# Patient Record
Sex: Male | Born: 1960
Health system: Southern US, Community
[De-identification: ages and names within clinical notes are randomized; demographics above are authoritative.]

## PROBLEM LIST (undated history)

## (undated) DIAGNOSIS — D649 Anemia, unspecified: Secondary | ICD-10-CM

## (undated) DIAGNOSIS — K5792 Diverticulitis of intestine, part unspecified, without perforation or abscess without bleeding: Secondary | ICD-10-CM

## (undated) DIAGNOSIS — E7800 Pure hypercholesterolemia, unspecified: Secondary | ICD-10-CM

## (undated) DIAGNOSIS — E119 Type 2 diabetes mellitus without complications: Secondary | ICD-10-CM

## (undated) DIAGNOSIS — F1020 Alcohol dependence, uncomplicated: Secondary | ICD-10-CM

## (undated) DIAGNOSIS — I10 Essential (primary) hypertension: Secondary | ICD-10-CM

## (undated) HISTORY — PX: OTHER SURGICAL HISTORY: SHX169

---

## 2004-09-06 ENCOUNTER — Other Ambulatory Visit: Payer: Self-pay

## 2004-09-06 ENCOUNTER — Emergency Department: Payer: Self-pay | Admitting: Emergency Medicine

## 2004-09-13 ENCOUNTER — Emergency Department: Payer: Self-pay | Admitting: General Practice

## 2007-05-17 ENCOUNTER — Emergency Department: Payer: Self-pay | Admitting: Emergency Medicine

## 2009-06-30 ENCOUNTER — Emergency Department: Payer: Self-pay | Admitting: Emergency Medicine

## 2009-09-15 ENCOUNTER — Emergency Department: Payer: Self-pay | Admitting: Internal Medicine

## 2012-03-30 ENCOUNTER — Emergency Department: Payer: Self-pay | Admitting: Emergency Medicine

## 2013-07-31 LAB — COMPREHENSIVE METABOLIC PANEL
ALBUMIN: 4.3 g/dL (ref 3.4–5.0)
ANION GAP: 15 (ref 7–16)
AST: 73 U/L — AB (ref 15–37)
Alkaline Phosphatase: 102 U/L
BILIRUBIN TOTAL: 0.8 mg/dL (ref 0.2–1.0)
BUN: 13 mg/dL (ref 7–18)
Calcium, Total: 9.2 mg/dL (ref 8.5–10.1)
Chloride: 102 mmol/L (ref 98–107)
Co2: 21 mmol/L (ref 21–32)
Creatinine: 1.05 mg/dL (ref 0.60–1.30)
EGFR (African American): >60
EGFR (Non-African Amer.): >60
GLUCOSE: 101 mg/dL — AB (ref 65–99)
OSMOLALITY: 276 (ref 275–301)
Potassium: 4 mmol/L (ref 3.5–5.1)
SGPT (ALT): 58 U/L (ref 12–78)
Sodium: 138 mmol/L (ref 136–145)
Total Protein: 8.5 g/dL — ABNORMAL HIGH (ref 6.4–8.2)

## 2013-07-31 LAB — CBC
HCT: 36.7 % — ABNORMAL LOW (ref 40.0–52.0)
HGB: 11.5 g/dL — AB (ref 13.0–18.0)
MCH: 29.2 pg (ref 26.0–34.0)
MCHC: 31.5 g/dL — ABNORMAL LOW (ref 32.0–36.0)
MCV: 93 fL (ref 80–100)
Platelet: 130 10*3/uL — ABNORMAL LOW (ref 150–440)
RBC: 3.95 10*6/uL — ABNORMAL LOW (ref 4.40–5.90)
RDW: 16.9 % — AB (ref 11.5–14.5)
WBC: 4.5 10*3/uL (ref 3.8–10.6)

## 2013-07-31 LAB — ETHANOL

## 2013-07-31 LAB — SALICYLATE LEVEL

## 2013-07-31 LAB — TSH: THYROID STIMULATING HORM: 3.08 u[IU]/mL

## 2013-07-31 LAB — ACETAMINOPHEN LEVEL: Acetaminophen: <2 ug/mL

## 2013-08-01 ENCOUNTER — Inpatient Hospital Stay: Payer: Self-pay | Admitting: Internal Medicine

## 2013-08-01 LAB — URINALYSIS, COMPLETE
BILIRUBIN, UR: NEGATIVE
Bacteria: NEGATIVE
GLUCOSE, UR: NEGATIVE mg/dL (ref 0–75)
Leukocyte Esterase: NEGATIVE
Nitrite: NEGATIVE
PH: 6 (ref 4.5–8.0)
Protein: 30
Specific Gravity: 1.017 (ref 1.003–1.030)

## 2013-08-01 LAB — DRUG SCREEN, URINE

## 2013-08-01 LAB — LIPASE, BLOOD: Lipase: 186 U/L (ref 73–393)

## 2013-08-02 LAB — COMPREHENSIVE METABOLIC PANEL
ALBUMIN: 3.3 g/dL — AB (ref 3.4–5.0)
AST: 56 U/L — AB (ref 15–37)
Alkaline Phosphatase: 91 U/L
Anion Gap: 9 (ref 7–16)
BUN: 8 mg/dL (ref 7–18)
Bilirubin,Total: 1.3 mg/dL — ABNORMAL HIGH (ref 0.2–1.0)
CALCIUM: 7.7 mg/dL — AB (ref 8.5–10.1)
CHLORIDE: 103 mmol/L (ref 98–107)
CO2: 23 mmol/L (ref 21–32)
CREATININE: 1 mg/dL (ref 0.60–1.30)
EGFR (African American): >60
EGFR (Non-African Amer.): >60
Glucose: 104 mg/dL — ABNORMAL HIGH (ref 65–99)
Osmolality: 269 (ref 275–301)
Potassium: 3.5 mmol/L (ref 3.5–5.1)
SGPT (ALT): 41 U/L (ref 12–78)
Sodium: 135 mmol/L — ABNORMAL LOW (ref 136–145)
Total Protein: 7.4 g/dL (ref 6.4–8.2)

## 2013-08-02 LAB — CBC WITH DIFFERENTIAL/PLATELET
BASOS ABS: 0 10*3/uL (ref 0.0–0.1)
Basophil %: 1 %
EOS ABS: 0.1 10*3/uL (ref 0.0–0.7)
EOS PCT: 2.3 %
HCT: 34.5 % — AB (ref 40.0–52.0)
HGB: 11.3 g/dL — ABNORMAL LOW (ref 13.0–18.0)
LYMPHS ABS: 0.8 10*3/uL — AB (ref 1.0–3.6)
Lymphocyte %: 21.8 %
MCH: 30.3 pg (ref 26.0–34.0)
MCHC: 32.8 g/dL (ref 32.0–36.0)
MCV: 92 fL (ref 80–100)
Monocyte #: 0.4 x10 3/mm (ref 0.2–1.0)
Monocyte %: 11.6 %
Neutrophil #: 2.2 10*3/uL (ref 1.4–6.5)
Neutrophil %: 63.3 %
Platelet: 111 10*3/uL — ABNORMAL LOW (ref 150–440)
RBC: 3.73 10*6/uL — ABNORMAL LOW (ref 4.40–5.90)
RDW: 16.6 % — ABNORMAL HIGH (ref 11.5–14.5)
WBC: 3.5 10*3/uL — ABNORMAL LOW (ref 3.8–10.6)

## 2013-08-02 LAB — TROPONIN I
Troponin-I: <0.02 ng/mL
Troponin-I: <0.02 ng/mL

## 2013-09-19 ENCOUNTER — Emergency Department: Payer: Self-pay | Admitting: Student

## 2013-09-19 LAB — URINALYSIS, COMPLETE
BILIRUBIN, UR: NEGATIVE
Bacteria: NONE SEEN
Blood: NEGATIVE
Glucose,UR: NEGATIVE mg/dL (ref 0–75)
Hyaline Cast: 2
KETONE: NEGATIVE
Leukocyte Esterase: NEGATIVE
NITRITE: NEGATIVE
PH: 5 (ref 4.5–8.0)
RBC, UR: NONE SEEN /HPF (ref 0–5)
SPECIFIC GRAVITY: 1.009 (ref 1.003–1.030)
SQUAMOUS EPITHELIAL: NONE SEEN
WBC UR: NONE SEEN /HPF (ref 0–5)

## 2013-09-19 LAB — COMPREHENSIVE METABOLIC PANEL
ANION GAP: 12 (ref 7–16)
Albumin: 4.1 g/dL (ref 3.4–5.0)
Alkaline Phosphatase: 85 U/L
BUN: 18 mg/dL (ref 7–18)
Bilirubin,Total: 0.6 mg/dL (ref 0.2–1.0)
CALCIUM: 8.1 mg/dL — AB (ref 8.5–10.1)
CO2: 22 mmol/L (ref 21–32)
Chloride: 107 mmol/L (ref 98–107)
Creatinine: 1.25 mg/dL (ref 0.60–1.30)
EGFR (African American): >60
Glucose: 121 mg/dL — ABNORMAL HIGH (ref 65–99)
Osmolality: 284 (ref 275–301)
Potassium: 3.4 mmol/L — ABNORMAL LOW (ref 3.5–5.1)
SGOT(AST): 50 U/L — ABNORMAL HIGH (ref 15–37)
SGPT (ALT): 32 U/L
SODIUM: 141 mmol/L (ref 136–145)
Total Protein: 8.1 g/dL (ref 6.4–8.2)

## 2013-09-19 LAB — CBC
HCT: 38.3 % — ABNORMAL LOW (ref 40.0–52.0)
HGB: 12.5 g/dL — AB (ref 13.0–18.0)
MCH: 29.7 pg (ref 26.0–34.0)
MCHC: 32.7 g/dL (ref 32.0–36.0)
MCV: 91 fL (ref 80–100)
Platelet: 174 10*3/uL (ref 150–440)
RBC: 4.22 10*6/uL — ABNORMAL LOW (ref 4.40–5.90)
RDW: 15.5 % — AB (ref 11.5–14.5)
WBC: 5.3 10*3/uL (ref 3.8–10.6)

## 2013-09-19 LAB — ETHANOL
Ethanol %: 0.381 % (ref 0.000–0.080)
Ethanol: 381 mg/dL

## 2013-09-19 LAB — LIPASE, BLOOD: Lipase: 216 U/L (ref 73–393)

## 2013-09-19 LAB — TROPONIN I: Troponin-I: <0.02 ng/mL

## 2014-04-05 ENCOUNTER — Ambulatory Visit: Payer: Self-pay | Admitting: Family Medicine

## 2014-04-05 DIAGNOSIS — I517 Cardiomegaly: Secondary | ICD-10-CM

## 2014-05-20 NOTE — Discharge Summary (Signed)
PATIENT NAME:  George Gomez, EGNOR MR#:  [id number] DATE OF BIRTH:  Jun 12, 1960  DATE OF ADMISSION:  08/01/2013 DATE OF DISCHARGE:  08/03/2013  ADMITTING DIAGNOSIS: Nausea and vomiting.   DISCHARGE DIAGNOSES: 1.  Nausea and vomiting likely due to gastritis, now resolved.  2.  Abdominal pain, unclear etiology. CT scan of the abdomen and pelvis is negative.  3.  Hematuria on presentation. No further hematuria noted during hospitalization.  4.  History of alcohol abuse with symptoms of withdrawal initially on presentation. No further symptoms of withdrawal. The patient counseled regarding alcohol cessation. 5.  Gynecomastia due to likely alcohol abuse and liver disease. The patient underwent a mammogram which shows gynecomastia.  6.  History of gout.  7.  Hyperlipidemia.  8.  History of iron deficiency anemia.  9.  Hypertension.  10.  Diabetes.   CONSULTANTS: None.   PERTINENT LABS AND EVALUATIONS: Admitting glucose 101. LFTs: AST was 56, ALT 41, and alk phos 91. Albumin was 3.3. Bili total was 1.3. Glucose 101, BUN 13, creatinine 1.05, sodium 138, potassium 4, chloride 102. CO2 is 21. Lipase 186. Troponin less than 0.02 x3. Tox urine drug screen was negative. WBC 4.5, hemoglobin 11.5, and platelet count was 130,000.  CT of the abdomen and pelvis showed diverticulosis, moderate to severe hepatic steatosis.  Mammogram showed bilateral gynecomastia, left greater than right. No evidence of malignancy.   HOSPITAL COURSE: Please refer to H and P done by the admitting physician. The patient is a 54 year old African American male with history of alcohol abuse who presented to the hospital with complaint of nausea and vomiting as well as left lower quadrant abdominal pain. He was seen in the ED and he had evaluation including a CT scan of the abdomen and pelvis. He also had other laboratory evaluation. No clear etiology for his abdominal pain was found. However, his abdominal pain resolved after the second  day of hospitalization. In terms of his nausea and vomiting, he likely had gastritis from excessive alcohol ingestion. The patient also had evidence of significant hepatic steatosis and likely hepatitis as a result of his alcohol abuse. That could also cause the nausea and vomiting. His nausea and vomiting on discharge were resolved. He was strongly recommended to stop drinking. He also was noted to have asymmetrical swelling of his breasts; therefore, he underwent a mammogram. No mass was identified. He had gynecomastia likely due to his liver disease. At this time, he is tolerating diet well and is doing much better and is stable for discharge.   DISCHARGE MEDICATIONS: Metformin 500 one tab p.o. b.i.d., iron sulfate 325 daily, gabapentin 100 one tab p.o. t.i.d., losartan 50 one tab p.o. daily, atorvastatin 10 at bedtime, allopurinol 100 daily, Protonix 40 daily, hydrochlorothiazide 25 daily.   DISCHARGE DIET: Low sodium, diet consistency regular.   DISCHARGE ACTIVITY: As tolerated.  TIMEFRAME FOR FOLLOWUP: In 1 to 2 weeks with primary MD. The patient is recommended to stop drinking.  TIME SPENT ON DISCHARGE: 35 minutes. ____________________________ Lafonda Mosses Posey Pronto, MD shp:sb D: 08/04/2013 08:21:25 ET T: 08/04/2013 08:52:33 ET JOB#: [id number]  cc: Keiara Sneeringer H. Posey Pronto, MD, <Dictator> Alric Seton MD ELECTRONICALLY SIGNED 08/10/2013 8:41

## 2014-05-20 NOTE — H&P (Signed)
PATIENT NAME:  George Gomez, George Gomez MR#:  [id number] DATE OF BIRTH:  [birth year]  DATE OF ADMISSION:  07/31/2013  PRIMARY CARE PHYSICIAN: [facility]   HISTORY OF PRESENT ILLNESS:  Patient is a 54 year old African American male with a history of gout, hyperlipidemia, iron deficiency anemia, hypertension, as well as diabetes mellitus, who presents to the hospital with complaints of nausea and vomiting. He is very vague about his symptomatology but apparently he has been having nausea and vomiting, as well as chills, as well as blurring of vision. He decided to come to Emergency Room for further evaluation. In the Emergency Room, he was felt to have alcohol withdrawal and hospitalist services were contacted for admission. Lipase level is still pending and no other studies were performed except of labs at present which were unremarkable, except for mild elevation of AST to 73. Urine drug screen was also negative.   PAST MEDICAL HISTORY: Significant for history of gout, hyperlipidemia, iron deficiency anemia, hypertension, as well as diabetes mellitus.   MEDICATIONS: According to medical records, the patient is on allopurinol 100 mg p.o. daily,  FN07 10 mg p.o. at bedtime, iron sulfate 325 mg p.o. daily, gabapentin 100 mg 3 times daily, losartan 50 mg p.o. daily, metformin extended-release 500 mg twice daily.   ALLERGIES: None.   FAMILY HISTORY: Hypertension, as well as diabetes in the patient's father.  Patient's mother has arthritis, prostate cancer in patient's father.   SOCIAL HISTORY: The patient is single, has no children. Does not smoke.  Smoked in the past. Stated he drinks approximately a pint of gin a day. Denies any other drugs. Last drink was on Sunday morning, which was 2 days ago.   REVIEW OF SYSTEMS:  CONSTITUTIONAL: He has been also shaky, anxious over the past 2 days. Admits of having chills and feeling sweaty, admits of fatigue and weakness, pains in left lower quadrant with  vomiting.  Admits to having some blurring of vision as well as intermittent double vision.  Admits of some snoring; however never was recommended to get obstructive sleep apnea evaluation. Admits of having some cough with yellowish phlegm production, which is chronic, also intermittent wheezes and intermittent hemoptysis, dyspnea on exertion. Admits of feeling presyncopal intermittently. Has been having left wrist swelling for the past 1 year. Has been recommended mammogram by primary care physician. However, that was not set up yet apparently.  Nausea, vomiting over the past 2 or 3 days. Diarrhea intermittently, chronically. Abdominal pain in left lower quadrant, however, not able to provide much more history about that.  Last bowel movement was on Friday 2 or 3 days ago, which was within normal limits. Intermittent diarrhea.  Knee pains as well as intermittent dysuria. Multiple medical issues seem to be a problem.  The patient is very vague about when exactly it happened. Denies any high fevers. Denies weight loss or gain.   EYES:  Denies glaucoma or cataracts.  EARS, NOSE, AND THROAT:  Denies tinnitus, allergies, epistaxis, sinus pain, dentures, difficulty swallowing.  RESPIRATORY: Denies any asthma, COPD.  CARDIOVASCULAR: Denies chest pains, orthopnea, palpitations. GASTROINTESTINAL: Denies any hematemesis, hematochezia or change in bowel habits.  GENITOURINARY: Denies dysuria, hematuria, frequency, incontinence. ENDOCRINOLOGY: Denies any polydipsia, nocturia, thyroid problems, heat or cold intolerance or thirst.  HEMATOLOGIC: Denies anemia, easy bruising or bleeding, swollen glands.  SKIN: Denies acne, rash, lesions or change in moles.  MUSCULOSKELETAL: Denies arthritis, cramps, swelling. NEUROLOGIC: Denies numbness, epilepsy or tremor.  PSYCHIATRIC: Denies anxiety, insomnia or depression.  PHYSICAL EXAMINATION: VITAL SIGNS:  On arrival to the hospital, temperature was 98.8, pulse was 91,  respirations were 20, blood pressure was 78/100, oxygen saturations were 96% on room air. During my evaluation, his heart rate is 93. Oxygen saturations were 98% on room air. The respirations were 29, blood pressure 133/93.   GENERAL:  He is a well developed, well nourished [place] male in no significant distress, lying on the stretcher.  HEENT: His pupils are equal, reactive to light. Extraocular muscles intact. No icterus or conjunctivitis. Has normal hearing. No pharyngeal erythema. Mucosa is moist.  NECK: No masses. Supple, nontender. Thyroid is not enlarged. No adenopathy. No JVD or carotid bruits bilaterally. Full range of motion.  LUNGS: Clear to auscultation in all fields. No rales, rhonchi, diminished breath sounds or wheezing. No labored inspirations, increased effort, dullness to percussion or overt respiratory distress.  CARDIOVASCULAR: S1, S2 appreciated.  Rhythm is regular.  PMI not lateralized. Chest is nontender to palpation. Pedal pulses 1+. No lower extremity edema, calf tenderness or cyanosis was noted.   ABDOMEN: Soft, tender in left lower quadrant, but no rebound, no guarding were noted. No hepatosplenomegaly or masses were noted.  RECTAL: Deferred.  MUSCLE STRENGTH: Able to move all extremities. No cyanosis, degenerative joint disease or kyphosis. Gait was not tested.  SKIN: Did not reveal any rashes, lesions, erythema, nodularity or induration. It was warm and dry to palpation. LYMPHATIC: No adenopathy in the cervical region.  NEUROLOGIC: Cranial grossly intact. Sensory is intact. No dysarthria or aphasia. The patient is alert, oriented to time, person, and place. Cooperative. Patient's memory is somewhat impaired.  PSYCHIATRIC: No significant confusion, agitation or depression noted. BREASTS:  Patient's left breast is enlarged, tender to palpation, somewhat swollen, but no discrete areas of nodularity was noted. No fluctuation was noted and no adenopathy.   LABORATORY  DATA: BMP showed a glucose of 101. Liver enzymes: Total protein was 8.5 .  AST was elevated at 73. TSH was 3.08.  Urine drug screen negative.  White blood cell count was normal at 4.5, hemoglobin 11.5, platelet count 130,000.  Urinalysis: Yellow, clear. Negative for glucose or bilirubin, 1+ ketones, specific gravity 1.017, pH was 6.0, 1+ blood, 30 mg deciliter protein, negative for nitrites or leukocyte esterase, 5-15 red blood cells, 3 white blood cells. Negative for bacteria, 0-5 epithelial cells. Mucus was present as well as 0.5 hyaline casts.  Tylenol level was less than 2 and salicylate level less than 1.7.   ASSESSMENT AND PLAN:  1. Nausea and vomiting.  Admit patient to medical floor. The patient's nausea, vomiting could be related to alcoholic gastritis. We will initiate the patient on PPIs.  We will get lipase level to rule out pancreatitis.  We will continue patient on clear liquid diet for now and advance diet as tolerated.  2. Left lower quadrant abdominal pain. We will get CT scan without contrast.  3. Hematuria, questionable kidney stones. Get CT scan of abdomen and pelvis and get urine cultures.  4. Alcohol withdrawal. Continue the patient on CIWA scale.  5. Left breast mass. Get mammography, bilateral breasts.   TIME SPENT:  One hour.   ____________________________ Katharina Caperima Serigne Kubicek, MD rv:dd D: 08/01/2013 18:39:15 ET T: 08/01/2013 20:41:18 ET JOB#: [id number]  cc: Katharina Caperima Preslynn Bier, MD, <Dictator> Unknown [facility] Kellene Mccleary MD ELECTRONICALLY SIGNED 08/22/2013 15:44

## 2015-02-23 ENCOUNTER — Emergency Department: Payer: Medicaid Other

## 2015-02-23 ENCOUNTER — Emergency Department
Admission: EM | Admit: 2015-02-23 | Discharge: 2015-02-23 | Disposition: A | Discharge status: Home / Self Care | Payer: Medicaid Other | Attending: Emergency Medicine | Admitting: Emergency Medicine

## 2015-02-23 ENCOUNTER — Other Ambulatory Visit: Payer: Self-pay

## 2015-02-23 ENCOUNTER — Encounter: Payer: Self-pay | Admitting: Emergency Medicine

## 2015-02-23 DIAGNOSIS — K219 Gastro-esophageal reflux disease without esophagitis: Secondary | ICD-10-CM | POA: Diagnosis not present

## 2015-02-23 DIAGNOSIS — R197 Diarrhea, unspecified: Secondary | ICD-10-CM | POA: Diagnosis not present

## 2015-02-23 DIAGNOSIS — I10 Essential (primary) hypertension: Secondary | ICD-10-CM | POA: Diagnosis not present

## 2015-02-23 DIAGNOSIS — E119 Type 2 diabetes mellitus without complications: Secondary | ICD-10-CM | POA: Diagnosis not present

## 2015-02-23 DIAGNOSIS — R1110 Vomiting, unspecified: Secondary | ICD-10-CM

## 2015-02-23 DIAGNOSIS — R1084 Generalized abdominal pain: Secondary | ICD-10-CM

## 2015-02-23 DIAGNOSIS — R109 Unspecified abdominal pain: Secondary | ICD-10-CM | POA: Diagnosis present

## 2015-02-23 HISTORY — DX: Essential (primary) hypertension: I10

## 2015-02-23 HISTORY — DX: Anemia, unspecified: D64.9

## 2015-02-23 HISTORY — DX: Pure hypercholesterolemia, unspecified: E78.00

## 2015-02-23 HISTORY — DX: Type 2 diabetes mellitus without complications: E11.9

## 2015-02-23 LAB — URINALYSIS COMPLETE WITH MICROSCOPIC (ARMC ONLY)
BACTERIA UA: NONE SEEN
GLUCOSE, UA: 50 mg/dL — AB
HGB URINE DIPSTICK: NEGATIVE
LEUKOCYTES UA: NEGATIVE
NITRITE: NEGATIVE
Protein, ur: 100 mg/dL — AB
Specific Gravity, Urine: 1.027 (ref 1.005–1.030)
pH: 5 (ref 5.0–8.0)

## 2015-02-23 LAB — CBC
HEMATOCRIT: 45.8 % (ref 40.0–52.0)
HEMOGLOBIN: 14.7 g/dL (ref 13.0–18.0)
MCH: 28.3 pg (ref 26.0–34.0)
MCHC: 32 g/dL (ref 32.0–36.0)
MCV: 88.5 fL (ref 80.0–100.0)
Platelets: 190 10*3/uL (ref 150–440)
RBC: 5.18 MIL/uL (ref 4.40–5.90)
RDW: 16.5 % — ABNORMAL HIGH (ref 11.5–14.5)
WBC: 6.2 10*3/uL (ref 3.8–10.6)

## 2015-02-23 LAB — COMPREHENSIVE METABOLIC PANEL
ALBUMIN: 4.5 g/dL (ref 3.5–5.0)
ALT: 38 U/L (ref 17–63)
ANION GAP: 15 (ref 5–15)
AST: 34 U/L (ref 15–41)
Alkaline Phosphatase: 83 U/L (ref 38–126)
BUN: 26 mg/dL — ABNORMAL HIGH (ref 6–20)
CHLORIDE: 99 mmol/L — AB (ref 101–111)
CO2: 22 mmol/L (ref 22–32)
Calcium: 9.6 mg/dL (ref 8.9–10.3)
Creatinine, Ser: 1.52 mg/dL — ABNORMAL HIGH (ref 0.61–1.24)
GFR calc non Af Amer: 50 mL/min — ABNORMAL LOW (ref >60)
GFR, EST AFRICAN AMERICAN: 57 mL/min — AB (ref >60)
Glucose, Bld: 187 mg/dL — ABNORMAL HIGH (ref 65–99)
Potassium: 5 mmol/L (ref 3.5–5.1)
SODIUM: 136 mmol/L (ref 135–145)
Total Bilirubin: 1.8 mg/dL — ABNORMAL HIGH (ref 0.3–1.2)
Total Protein: 8.1 g/dL (ref 6.5–8.1)

## 2015-02-23 LAB — TROPONIN I

## 2015-02-23 LAB — LIPASE, BLOOD: Lipase: 26 U/L (ref 11–51)

## 2015-02-23 MED ORDER — FAMOTIDINE 20 MG PO TABS
20.0000 mg | ORAL_TABLET | Freq: Once | ORAL | Status: AC
  Administered 2015-02-23: 20 mg via ORAL
  Filled 2015-02-23: qty 1

## 2015-02-23 MED ORDER — PROMETHAZINE HCL 25 MG PO TABS: 25.0000 mg | ORAL_TABLET | Freq: Four times a day (QID) | ORAL | Status: DC | PRN

## 2015-02-23 MED ORDER — ONDANSETRON HCL 4 MG/2ML IJ SOLN
4.0000 mg | Freq: Once | INTRAMUSCULAR | Status: AC
  Administered 2015-02-23: 4 mg via INTRAVENOUS

## 2015-02-23 MED ORDER — ONDANSETRON HCL 4 MG/2ML IJ SOLN
4.0000 mg | Freq: Once | INTRAMUSCULAR | Status: AC
  Administered 2015-02-23: 4 mg via INTRAVENOUS
  Filled 2015-02-23: qty 2

## 2015-02-23 MED ORDER — SODIUM CHLORIDE 0.9 % IV SOLN
Freq: Once | INTRAVENOUS | Status: AC
  Administered 2015-02-23: 18:00:00 via INTRAVENOUS

## 2015-02-23 MED ORDER — SODIUM CHLORIDE 0.9 % IV BOLUS (SEPSIS)
1000.0000 mL | Freq: Once | INTRAVENOUS | Status: AC
  Administered 2015-02-23: 1000 mL via INTRAVENOUS

## 2015-02-23 MED ORDER — ONDANSETRON HCL 4 MG/2ML IJ SOLN
INTRAMUSCULAR | Status: AC
  Administered 2015-02-23: 4 mg via INTRAVENOUS
  Filled 2015-02-23: qty 2

## 2015-02-23 MED ORDER — MORPHINE SULFATE (PF) 4 MG/ML IV SOLN
4.0000 mg | Freq: Once | INTRAVENOUS | Status: AC
  Administered 2015-02-23: 4 mg via INTRAVENOUS
  Filled 2015-02-23: qty 1

## 2015-02-23 MED ORDER — FAMOTIDINE 20 MG PO TABS
20.0000 mg | ORAL_TABLET | Freq: Every day | ORAL | Status: DC
Start: 2015-02-23 — End: 2016-03-18

## 2015-02-23 NOTE — ED Notes (Signed)
Pt presents with abd pain, n/v/d for three days. Pt states had same about couple weeks ago, got better but then came back three days ago. Pt states it usually happens when he drinks alcohol.

## 2015-02-23 NOTE — ED Provider Notes (Signed)
[facility] Emergency Department Provider Note     Time seen: ----------------------------------------- 5:52 PM on 02/23/2015 -----------------------------------------    I have reviewed the triage vital signs and the nursing notes.   HISTORY  Chief Complaint Abdominal Pain    HPI George Gomez is a 55 y.o. male who presents ER with abdominal pain associated with nausea and vomiting and diarrhea for the last 3 days. Patient states had same thing several weeks ago it got better but then came back. Patient states this usually happens when he drinks alcohol. He has had fever and chills but denies any other complaints.   Past Medical History  Diagnosis Date  . Hypertension   . Diabetes mellitus without complication (HCC)   . Anemia   . Hypercholesteremia     There are no active problems to display for this patient.   History reviewed. No pertinent past surgical history.  Allergies Review of patient's allergies indicates no known allergies.  Social History Social History  Substance Use Topics  . Smoking status: Never Smoker   . Smokeless tobacco: None  . Alcohol Use: Yes    Review of Systems Constitutional: Negative for fever. Positive for chills Eyes: Negative for visual changes. ENT: Negative for sore throat. Cardiovascular: Negative for chest pain. Respiratory: Negative for shortness of breath. Gastrointestinal: Positive for abdominal pain, vomiting and diarrhea Genitourinary: Negative for dysuria. Musculoskeletal: Negative for back pain. Skin: Negative for rash. Neurological: Negative for headaches, focal weakness or numbness.  10-point ROS otherwise negative.  ____________________________________________   PHYSICAL EXAM:  VITAL SIGNS: ED Triage Vitals  Enc Vitals Group     BP 02/23/15 1610 121/83 mmHg     Pulse Rate 02/23/15 1610 133     Resp 02/23/15 1610 18     Temp 02/23/15 1610 98.3 F (36.8 C)     Temp Source  02/23/15 1610 Oral     SpO2 02/23/15 1610 96 %     Weight 02/23/15 1610 220 lb (99.791 kg)     Height 02/23/15 1610  (1.803 m)     Head Cir --      Peak Flow --      Pain Score 02/23/15 1633 7     Pain Loc --      Pain Edu? --      Excl. in GC? --     Constitutional: Alert and oriented. Well appearing and in no distress. Eyes: Conjunctivae are normal. PERRL. Normal extraocular movements. ENT   Head: Normocephalic and atraumatic.   Nose: No congestion/rhinnorhea.   Mouth/Throat: Mucous membranes are moist.   Neck: No stridor. Cardiovascular: Normal rate, regular rhythm. Normal and symmetric distal pulses are present in all extremities. No murmurs, rubs, or gallops. Respiratory: Normal respiratory effort without tachypnea nor retractions. Breath sounds are clear and equal bilaterally. No wheezes/rales/rhonchi. Gastrointestinal: Diffuse nonfocal tenderness, no rebound or guarding. Normal bowel sounds. Musculoskeletal: Nontender with normal range of motion in all extremities. No joint effusions.  No lower extremity tenderness nor edema. Neurologic:  Normal speech and language. No gross focal neurologic deficits are appreciated. Speech is normal. No gait instability. Skin:  Skin is warm, dry and intact. No rash noted. Psychiatric: Mood and affect are normal. Speech and behavior are normal. Patient exhibits appropriate insight and judgment. ____________________________________________  EKG: Interpreted by me. Sinus tachycardia with rate of 120 bpm, normal PR interval, normal QRS, normal QT interval. Left axis deviation.  ____________________________________________  ED COURSE:  Pertinent labs & imaging results that  were available during my care of the patient were reviewed by me and considered in my medical decision making (see chart for details). Patient is tachycardic with likely pancreatitis. We'll give fluids antiemetics and pain  medicine. ____________________________________________    LABS (pertinent positives/negatives)  Labs Reviewed  COMPREHENSIVE METABOLIC PANEL - Abnormal; Notable for the following:    Chloride 99 (*)    Glucose, Bld 187 (*)    BUN 26 (*)    Creatinine, Ser 1.52 (*)    Total Bilirubin 1.8 (*)    GFR calc non Af Amer 50 (*)    GFR calc Af Amer 57 (*)    All other components within normal limits  CBC - Abnormal; Notable for the following:    RDW 16.5 (*)    All other components within normal limits  URINALYSIS COMPLETEWITH MICROSCOPIC ([facility] ONLY) - Abnormal; Notable for the following:    Color, Urine AMBER (*)    APPearance CLOUDY (*)    Glucose, UA 50 (*)    Bilirubin Urine 2+ (*)    Ketones, ur 1+ (*)    Protein, ur 100 (*)    Squamous Epithelial / LPF 6-30 (*)    All other components within normal limits  LIPASE, BLOOD  TROPONIN I    ____________________________________________  FINAL ASSESSMENT AND PLAN  Abdominal pain, vomiting and diarrhea  Plan: Patient with labs as dictated above. Patient is feeling better after 2 L of saline and he is no longer tachycardic. Abdomen seems soft at this time. I will place on Pepcid as a think he has underlying GERD. Patient is also aware of his need to stop drinking. He is stable for outpatient follow-up with his doctor.   Emily Filbert, MD   Emily Filbert, MD 02/23/15 [phone]

## 2015-02-23 NOTE — Discharge Instructions (Signed)
Abdominal Pain, Adult Many things can cause abdominal pain. Usually, abdominal pain is not caused by a disease and will improve without treatment. It can often be observed and treated at home. Your health care provider will do a physical exam and possibly order blood tests and X-rays to help determine the seriousness of your pain. However, in many cases, more time must pass before a clear cause of the pain can be found. Before that point, your health care provider may not know if you need more testing or further treatment. HOME CARE INSTRUCTIONS Monitor your abdominal pain for any changes. The following actions may help to alleviate any discomfort you are experiencing:  Only take over-the-counter or prescription medicines as directed by your health care provider.  Do not take laxatives unless directed to do so by your health care provider.  Try a clear liquid diet (broth, tea, or water) as directed by your health care provider. Slowly move to a bland diet as tolerated. SEEK MEDICAL CARE IF:  You have unexplained abdominal pain.  You have abdominal pain associated with nausea or diarrhea.  You have pain when you urinate or have a bowel movement.  You experience abdominal pain that wakes you in the night.  You have abdominal pain that is worsened or improved by eating food.  You have abdominal pain that is worsened with eating fatty foods.  You have a fever. SEEK IMMEDIATE MEDICAL CARE IF:  Your pain does not go away within 2 hours.  You keep throwing up (vomiting).  Your pain is felt only in portions of the abdomen, such as the right side or the left lower portion of the abdomen.  You pass bloody or black tarry stools. MAKE SURE YOU:  Understand these instructions.  Will watch your condition.  Will get help right away if you are not doing well or get worse.   This information is not intended to replace advice given to you by your health care provider. Make sure you discuss  any questions you have with your health care provider.   Document Released: 10/23/2004 Document Revised: 10/04/2014 Document Reviewed: 09/22/2012 Elsevier Interactive Patient Education 2016 [place].  Diarrhea Diarrhea is frequent loose and watery bowel movements. It can cause you to feel weak and dehydrated. Dehydration can cause you to become tired and thirsty, have a dry mouth, and have decreased urination that often is dark yellow. Diarrhea is a sign of another problem, most often an infection that will not last long. In most cases, diarrhea typically lasts 2-3 days. However, it can last longer if it is a sign of something more serious. It is important to treat your diarrhea as directed by your caregiver to lessen or prevent future episodes of diarrhea. CAUSES  Some common causes include:  Gastrointestinal infections caused by viruses, bacteria, or parasites.  Food poisoning or food allergies.  Certain medicines, such as antibiotics, chemotherapy, and laxatives.  Artificial sweeteners and fructose.  Digestive disorders. HOME CARE INSTRUCTIONS  Ensure adequate fluid intake (hydration): Have 1 cup (8 oz) of fluid for each diarrhea episode. Avoid fluids that contain simple sugars or sports drinks, fruit juices, whole milk products, and sodas. Your urine should be clear or pale yellow if you are drinking enough fluids. Hydrate with an oral rehydration solution that you can purchase at pharmacies, retail stores, and online. You can prepare an oral rehydration solution at home by mixing the following ingredients together:   - tsp table salt.   tsp baking soda.  tsp salt substitute containing potassium chloride. °· 1  tablespoons sugar. °· 1 L (34 oz) of water. °· Certain foods and beverages may increase the speed at which food moves through the gastrointestinal (GI) tract. These foods and beverages should be avoided and include: °· Caffeinated and alcoholic beverages. °· High-fiber  foods, such as raw fruits and vegetables, nuts, seeds, and whole grain breads and cereals. °· Foods and beverages sweetened with sugar alcohols, such as xylitol, sorbitol, and mannitol. °· Some foods may be well tolerated and may help thicken stool including: °· Starchy foods, such as rice, toast, pasta, low-sugar cereal, oatmeal, grits, baked potatoes, crackers, and bagels. °· Bananas. °· Applesauce. °· Add probiotic-rich foods to help increase healthy bacteria in the GI tract, such as yogurt and fermented milk products. °· Wash your hands well after each diarrhea episode. °· Only take over-the-counter or prescription medicines as directed by your caregiver. °· Take a warm bath to relieve any burning or pain from frequent diarrhea episodes. °SEEK IMMEDIATE MEDICAL CARE IF:  °· You are unable to keep fluids down. °· You have persistent vomiting. °· You have blood in your stool, or your stools are black and tarry. °· You do not urinate in 6-8 hours, or there is only a small amount of very dark urine. °· You have abdominal pain that increases or localizes. °· You have weakness, dizziness, confusion, or light-headedness. °· You have a severe headache. °· Your diarrhea gets worse or does not get better. °· You have a fever or persistent symptoms for more than 2-3 days. °· You have a fever and your symptoms suddenly get worse. °MAKE SURE YOU:  °· Understand these instructions. °· Will watch your condition. °· Will get help right away if you are not doing well or get worse. °  °This information is not intended to replace advice given to you by your health care provider. Make sure you discuss any questions you have with your health care provider. °  °Document Released: 01/03/2002 Document Revised: 02/03/2014 Document Reviewed: 09/21/2011 °Elsevier Interactive Patient Education ©2016 Elsevier Inc. ° °Nausea and Vomiting °Nausea is a sick feeling that often comes before throwing up (vomiting). Vomiting is a reflex where  stomach contents come out of your mouth. Vomiting can cause severe loss of body fluids (dehydration). Children and elderly adults can become dehydrated quickly, especially if they also have diarrhea. Nausea and vomiting are symptoms of a condition or disease. It is important to find the cause of your symptoms. °CAUSES  °· Direct irritation of the stomach lining. This irritation can result from increased acid production (gastroesophageal reflux disease), infection, food poisoning, taking certain medicines (such as nonsteroidal anti-inflammatory drugs), alcohol use, or tobacco use. °· Signals from the brain. These signals could be caused by a headache, heat exposure, an inner ear disturbance, increased pressure in the brain from injury, infection, a tumor, or a concussion, pain, emotional stimulus, or metabolic problems. °· An obstruction in the gastrointestinal tract (bowel obstruction). °· Illnesses such as diabetes, hepatitis, gallbladder problems, appendicitis, kidney problems, cancer, sepsis, atypical symptoms of a heart attack, or eating disorders. °· Medical treatments such as chemotherapy and radiation. °· Receiving medicine that makes you sleep (general anesthetic) during surgery. °DIAGNOSIS °Your caregiver may ask for tests to be done if the problems do not improve after a few days. Tests may also be done if symptoms are severe or if the reason for the nausea and vomiting is not clear. Tests may include: °· Urine tests. °·   Blood tests.  Stool tests.  Cultures (to look for evidence of infection).  X-rays or other imaging studies. Test results can help your caregiver make decisions about treatment or the need for additional tests. TREATMENT You need to stay well hydrated. Drink frequently but in small amounts.You may wish to drink water, sports drinks, clear broth, or eat frozen ice pops or gelatin dessert to help stay hydrated.When you eat, eating slowly may help prevent nausea.There are also some  antinausea medicines that may help prevent nausea. HOME CARE INSTRUCTIONS   Take all medicine as directed by your caregiver.  If you do not have an appetite, do not force yourself to eat. However, you must continue to drink fluids.  If you have an appetite, eat a normal diet unless your caregiver tells you differently.  Eat a variety of complex carbohydrates (rice, wheat, potatoes, bread), lean meats, yogurt, fruits, and vegetables.  Avoid high-fat foods because they are more difficult to digest.  Drink enough water and fluids to keep your urine clear or pale yellow.  If you are dehydrated, ask your caregiver for specific rehydration instructions. Signs of dehydration may include:  Severe thirst.  Dry lips and mouth.  Dizziness.  Dark urine.  Decreasing urine frequency and amount.  Confusion.  Rapid breathing or pulse. SEEK IMMEDIATE MEDICAL CARE IF:   You have blood or brown flecks (like coffee grounds) in your vomit.  You have black or bloody stools.  You have a severe headache or stiff neck.  You are confused.  You have severe abdominal pain.  You have chest pain or trouble breathing.  You do not urinate at least once every 8 hours.  You develop cold or clammy skin.  You continue to vomit for longer than 24 to 48 hours.  You have a fever. MAKE SURE YOU:   Understand these instructions.  Will watch your condition.  Will get help right away if you are not doing well or get worse.   This information is not intended to replace advice given to you by your health care provider. Make sure you discuss any questions you have with your health care provider.   Document Released: 01/13/2005 Document Revised: 04/07/2011 Document Reviewed: 06/12/2010 Elsevier Interactive Patient Education 2016 Elsevier Inc.  Gastroesophageal Reflux Disease, Adult Normally, food travels down the esophagus and stays in the stomach to be digested. However, when a person has  gastroesophageal reflux disease (GERD), food and stomach acid move back up into the esophagus. When this happens, the esophagus becomes sore and inflamed. Over time, GERD can create small holes (ulcers) in the lining of the esophagus.  CAUSES This condition is caused by a problem with the muscle between the esophagus and the stomach (lower esophageal sphincter, or LES). Normally, the LES muscle closes after food passes through the esophagus to the stomach. When the LES is weakened or abnormal, it does not close properly, and that allows food and stomach acid to go back up into the esophagus. The LES can be weakened by certain dietary substances, medicines, and medical conditions, including:  Tobacco use.  Pregnancy.  Having a hiatal hernia.  Heavy alcohol use.  Certain foods and beverages, such as coffee, chocolate, onions, and peppermint. RISK FACTORS This condition is more likely to develop in:  People who have an increased body weight.  People who have connective tissue disorders.  People who use NSAID medicines. SYMPTOMS Symptoms of this condition include:  Heartburn.  Difficult or painful swallowing.  The feeling  of having a lump in the throat.  Abitter taste in the mouth.  Bad breath.  Having a large amount of saliva.  Having an upset or bloated stomach.  Belching.  Chest pain.  Shortness of breath or wheezing.  Ongoing (chronic) cough or a night-time cough.  Wearing away of tooth enamel.  Weight loss. Different conditions can cause chest pain. Make sure to see your health care provider if you experience chest pain. DIAGNOSIS Your health care provider will take a medical history and perform a physical exam. To determine if you have mild or severe GERD, your health care provider may also monitor how you respond to treatment. You may also have other tests, including:  An endoscopy toexamine your stomach and esophagus with a small camera.  A test  thatmeasures the acidity level in your esophagus.  A test thatmeasures how much pressure is on your esophagus.  A barium swallow or modified barium swallow to show the shape, size, and functioning of your esophagus. TREATMENT The goal of treatment is to help relieve your symptoms and to prevent complications. Treatment for this condition may vary depending on how severe your symptoms are. Your health care provider may recommend:  Changes to your diet.  Medicine.  Surgery. HOME CARE INSTRUCTIONS Diet  Follow a diet as recommended by your health care provider. This may involve avoiding foods and drinks such as:  Coffee and tea (with or without caffeine).  Drinks that containalcohol.  Energy drinks and sports drinks.  Carbonated drinks or sodas.  Chocolate and cocoa.  Peppermint and mint flavorings.  Garlic and onions.  Horseradish.  Spicy and acidic foods, including peppers, chili powder, curry powder, vinegar, hot sauces, and barbecue sauce.  Citrus fruit juices and citrus fruits, such as oranges, lemons, and limes.  Tomato-based foods, such as red sauce, chili, salsa, and pizza with red sauce.  Fried and fatty foods, such as donuts, french fries, potato chips, and high-fat dressings.  High-fat meats, such as hot dogs and fatty cuts of red and white meats, such as rib eye steak, sausage, ham, and bacon.  High-fat dairy items, such as whole milk, butter, and cream cheese.  Eat small, frequent meals instead of large meals.  Avoid drinking large amounts of liquid with your meals.  Avoid eating meals during the 2-3 hours before bedtime.  Avoid lying down right after you eat.  Do not exercise right after you eat. General Instructions  Pay attention to any changes in your symptoms.  Take over-the-counter and prescription medicines only as told by your health care provider. Do not take aspirin, ibuprofen, or other NSAIDs unless your health care provider told  you to do so.  Do not use any tobacco products, including cigarettes, chewing tobacco, and e-cigarettes. If you need help quitting, ask your health care provider.  Wear loose-fitting clothing. Do not wear anything tight around your waist that causes pressure on your abdomen.  Raise (elevate) the head of your bed 6 inches (15cm).  Try to reduce your stress, such as with yoga or meditation. If you need help reducing stress, ask your health care provider.  If you are overweight, reduce your weight to an amount that is healthy for you. Ask your health care provider for guidance about a safe weight loss goal.  Keep all follow-up visits as told by your health care provider. This is important. SEEK MEDICAL CARE IF:  You have new symptoms.  You have unexplained weight loss.  You have difficulty swallowing,  or it hurts to swallow.  You have wheezing or a persistent cough.  Your symptoms do not improve with treatment.  You have a hoarse voice. SEEK IMMEDIATE MEDICAL CARE IF:  You have pain in your arms, neck, jaw, teeth, or back.  You feel sweaty, dizzy, or light-headed.  You have chest pain or shortness of breath.  You vomit and your vomit looks like blood or coffee grounds.  You faint.  Your stool is bloody or black.  You cannot swallow, drink, or eat.   This information is not intended to replace advice given to you by your health care provider. Make sure you discuss any questions you have with your health care provider.   Document Released: 10/23/2004 Document Revised: 10/04/2014 Document Reviewed: 05/10/2014 Elsevier Interactive Patient Education [employer]

## 2015-02-23 NOTE — ED Notes (Signed)
Pt has had upper abdominal pain across mid and left upper abdomen intermittently since yesterday.  Has had vomiting and some SHOB.  Has felt bad last 2 weeks but worse since yesterday.  Has also had some SHOB.  Skin warm dry and pink in triage.  No respiratory distress.

## 2015-09-20 ENCOUNTER — Ambulatory Visit: Payer: Medicare Other | Attending: Otolaryngology

## 2015-09-20 DIAGNOSIS — G4733 Obstructive sleep apnea (adult) (pediatric): Secondary | ICD-10-CM | POA: Diagnosis not present

## 2015-09-20 DIAGNOSIS — R0683 Snoring: Secondary | ICD-10-CM | POA: Insufficient documentation

## 2015-09-20 DIAGNOSIS — R0600 Dyspnea, unspecified: Secondary | ICD-10-CM | POA: Diagnosis present

## 2015-10-04 ENCOUNTER — Ambulatory Visit: Payer: Medicare Other | Attending: Otolaryngology

## 2015-10-04 DIAGNOSIS — R0683 Snoring: Secondary | ICD-10-CM | POA: Insufficient documentation

## 2015-10-04 DIAGNOSIS — G4733 Obstructive sleep apnea (adult) (pediatric): Secondary | ICD-10-CM | POA: Insufficient documentation

## 2016-03-17 ENCOUNTER — Inpatient Hospital Stay
Admission: EM | Admit: 2016-03-17 | Discharge: 2016-03-20 | DRG: 372 | Disposition: A | Discharge status: Home / Self Care | Payer: Medicare Other | Attending: Internal Medicine | Admitting: Internal Medicine

## 2016-03-17 ENCOUNTER — Encounter: Payer: Self-pay | Admitting: Emergency Medicine

## 2016-03-17 DIAGNOSIS — E1122 Type 2 diabetes mellitus with diabetic chronic kidney disease: Secondary | ICD-10-CM | POA: Diagnosis present

## 2016-03-17 DIAGNOSIS — E860 Dehydration: Secondary | ICD-10-CM | POA: Diagnosis present

## 2016-03-17 DIAGNOSIS — N182 Chronic kidney disease, stage 2 (mild): Secondary | ICD-10-CM | POA: Diagnosis present

## 2016-03-17 DIAGNOSIS — N179 Acute kidney failure, unspecified: Secondary | ICD-10-CM | POA: Diagnosis present

## 2016-03-17 DIAGNOSIS — K529 Noninfective gastroenteritis and colitis, unspecified: Secondary | ICD-10-CM

## 2016-03-17 DIAGNOSIS — Z833 Family history of diabetes mellitus: Secondary | ICD-10-CM | POA: Diagnosis not present

## 2016-03-17 DIAGNOSIS — N289 Disorder of kidney and ureter, unspecified: Secondary | ICD-10-CM

## 2016-03-17 DIAGNOSIS — E872 Acidosis, unspecified: Secondary | ICD-10-CM

## 2016-03-17 DIAGNOSIS — Z7984 Long term (current) use of oral hypoglycemic drugs: Secondary | ICD-10-CM | POA: Diagnosis not present

## 2016-03-17 DIAGNOSIS — E7800 Pure hypercholesterolemia, unspecified: Secondary | ICD-10-CM | POA: Diagnosis present

## 2016-03-17 DIAGNOSIS — A0472 Enterocolitis due to Clostridium difficile, not specified as recurrent: Secondary | ICD-10-CM | POA: Diagnosis present

## 2016-03-17 DIAGNOSIS — Z79899 Other long term (current) drug therapy: Secondary | ICD-10-CM

## 2016-03-17 DIAGNOSIS — R1013 Epigastric pain: Secondary | ICD-10-CM | POA: Diagnosis present

## 2016-03-17 DIAGNOSIS — D649 Anemia, unspecified: Secondary | ICD-10-CM | POA: Diagnosis present

## 2016-03-17 DIAGNOSIS — F1010 Alcohol abuse, uncomplicated: Secondary | ICD-10-CM | POA: Diagnosis present

## 2016-03-17 DIAGNOSIS — N189 Chronic kidney disease, unspecified: Secondary | ICD-10-CM

## 2016-03-17 DIAGNOSIS — E785 Hyperlipidemia, unspecified: Secondary | ICD-10-CM | POA: Diagnosis present

## 2016-03-17 DIAGNOSIS — I129 Hypertensive chronic kidney disease with stage 1 through stage 4 chronic kidney disease, or unspecified chronic kidney disease: Secondary | ICD-10-CM | POA: Diagnosis present

## 2016-03-17 DIAGNOSIS — E871 Hypo-osmolality and hyponatremia: Secondary | ICD-10-CM | POA: Diagnosis present

## 2016-03-17 DIAGNOSIS — R945 Abnormal results of liver function studies: Secondary | ICD-10-CM | POA: Diagnosis present

## 2016-03-17 LAB — COMPREHENSIVE METABOLIC PANEL
ALBUMIN: 4.4 g/dL (ref 3.5–5.0)
ALT: 52 U/L (ref 17–63)
ANION GAP: 28 — AB (ref 5–15)
AST: 68 U/L — ABNORMAL HIGH (ref 15–41)
Alkaline Phosphatase: 94 U/L (ref 38–126)
BILIRUBIN TOTAL: 1.5 mg/dL — AB (ref 0.3–1.2)
BUN: 34 mg/dL — ABNORMAL HIGH (ref 6–20)
CO2: 12 mmol/L — ABNORMAL LOW (ref 22–32)
Calcium: 8.7 mg/dL — ABNORMAL LOW (ref 8.9–10.3)
Chloride: 97 mmol/L — ABNORMAL LOW (ref 101–111)
Creatinine, Ser: 2.78 mg/dL — ABNORMAL HIGH (ref 0.61–1.24)
GFR calc Af Amer: 27 mL/min — ABNORMAL LOW (ref >60)
GFR calc non Af Amer: 24 mL/min — ABNORMAL LOW (ref >60)
Glucose, Bld: 109 mg/dL — ABNORMAL HIGH (ref 65–99)
POTASSIUM: 4.3 mmol/L (ref 3.5–5.1)
SODIUM: 137 mmol/L (ref 135–145)
Total Protein: 8.3 g/dL — ABNORMAL HIGH (ref 6.5–8.1)

## 2016-03-17 LAB — CBC
HEMATOCRIT: 39.1 % — AB (ref 40.0–52.0)
HEMOGLOBIN: 13 g/dL (ref 13.0–18.0)
MCH: 29.6 pg (ref 26.0–34.0)
MCHC: 33.4 g/dL (ref 32.0–36.0)
MCV: 88.8 fL (ref 80.0–100.0)
Platelets: 250 10*3/uL (ref 150–440)
RBC: 4.4 MIL/uL (ref 4.40–5.90)
RDW: 15.3 % — ABNORMAL HIGH (ref 11.5–14.5)
WBC: 7.5 10*3/uL (ref 3.8–10.6)

## 2016-03-17 LAB — LIPASE, BLOOD: Lipase: 44 U/L (ref 11–51)

## 2016-03-17 MED ORDER — SODIUM CHLORIDE 0.9 % IV BOLUS (SEPSIS)
1000.0000 mL | Freq: Once | INTRAVENOUS | Status: AC
  Administered 2016-03-18: 1000 mL via INTRAVENOUS

## 2016-03-17 MED ORDER — ONDANSETRON HCL 4 MG/2ML IJ SOLN
4.0000 mg | Freq: Once | INTRAMUSCULAR | Status: AC
  Administered 2016-03-18: 4 mg via INTRAVENOUS
  Filled 2016-03-17: qty 2

## 2016-03-17 NOTE — ED Triage Notes (Addendum)
Pt presents to ED c/o LLQ abdominal pain with right side weakness over 4 hours ago. Pt states he has been vomiting and passed out earlier today x2. Diaphoretic on arrival

## 2016-03-18 ENCOUNTER — Emergency Department: Payer: Medicare Other

## 2016-03-18 ENCOUNTER — Encounter: Payer: Self-pay | Admitting: Internal Medicine

## 2016-03-18 DIAGNOSIS — N182 Chronic kidney disease, stage 2 (mild): Secondary | ICD-10-CM | POA: Diagnosis present

## 2016-03-18 DIAGNOSIS — E785 Hyperlipidemia, unspecified: Secondary | ICD-10-CM | POA: Diagnosis present

## 2016-03-18 DIAGNOSIS — A0472 Enterocolitis due to Clostridium difficile, not specified as recurrent: Secondary | ICD-10-CM | POA: Diagnosis present

## 2016-03-18 DIAGNOSIS — I129 Hypertensive chronic kidney disease with stage 1 through stage 4 chronic kidney disease, or unspecified chronic kidney disease: Secondary | ICD-10-CM | POA: Diagnosis present

## 2016-03-18 DIAGNOSIS — E871 Hypo-osmolality and hyponatremia: Secondary | ICD-10-CM | POA: Diagnosis present

## 2016-03-18 DIAGNOSIS — Z79899 Other long term (current) drug therapy: Secondary | ICD-10-CM | POA: Diagnosis not present

## 2016-03-18 DIAGNOSIS — Z833 Family history of diabetes mellitus: Secondary | ICD-10-CM | POA: Diagnosis not present

## 2016-03-18 DIAGNOSIS — Z7984 Long term (current) use of oral hypoglycemic drugs: Secondary | ICD-10-CM | POA: Diagnosis not present

## 2016-03-18 DIAGNOSIS — K529 Noninfective gastroenteritis and colitis, unspecified: Secondary | ICD-10-CM

## 2016-03-18 DIAGNOSIS — N179 Acute kidney failure, unspecified: Secondary | ICD-10-CM | POA: Diagnosis present

## 2016-03-18 DIAGNOSIS — E860 Dehydration: Secondary | ICD-10-CM | POA: Diagnosis present

## 2016-03-18 DIAGNOSIS — E7800 Pure hypercholesterolemia, unspecified: Secondary | ICD-10-CM | POA: Diagnosis present

## 2016-03-18 DIAGNOSIS — E1122 Type 2 diabetes mellitus with diabetic chronic kidney disease: Secondary | ICD-10-CM | POA: Diagnosis present

## 2016-03-18 DIAGNOSIS — D649 Anemia, unspecified: Secondary | ICD-10-CM | POA: Diagnosis present

## 2016-03-18 DIAGNOSIS — E872 Acidosis: Secondary | ICD-10-CM | POA: Diagnosis present

## 2016-03-18 HISTORY — DX: Enterocolitis due to Clostridium difficile, not specified as recurrent: A04.72

## 2016-03-18 LAB — URINALYSIS, COMPLETE (UACMP) WITH MICROSCOPIC
Bacteria, UA: NONE SEEN
GLUCOSE, UA: 50 mg/dL — AB
Hgb urine dipstick: NEGATIVE
KETONES UR: 20 mg/dL — AB
LEUKOCYTES UA: NEGATIVE
NITRITE: NEGATIVE
PH: 5 (ref 5.0–8.0)
Protein, ur: 100 mg/dL — AB
SPECIFIC GRAVITY, URINE: 1.019 (ref 1.005–1.030)

## 2016-03-18 LAB — CBC
HEMATOCRIT: 32.3 % — AB (ref 40.0–52.0)
HEMOGLOBIN: 10.7 g/dL — AB (ref 13.0–18.0)
MCH: 29.5 pg (ref 26.0–34.0)
MCHC: 33.3 g/dL (ref 32.0–36.0)
MCV: 88.7 fL (ref 80.0–100.0)
Platelets: 184 10*3/uL (ref 150–440)
RBC: 3.64 MIL/uL — AB (ref 4.40–5.90)
RDW: 15.4 % — ABNORMAL HIGH (ref 11.5–14.5)
WBC: 6.2 10*3/uL (ref 3.8–10.6)

## 2016-03-18 LAB — TROPONIN I: Troponin I: <0.03 ng/mL (ref <0.03)

## 2016-03-18 LAB — URINE DRUG SCREEN, QUALITATIVE (ARMC ONLY)
Amphetamines, Ur Screen: NOT DETECTED
BARBITURATES, UR SCREEN: NOT DETECTED
BENZODIAZEPINE, UR SCRN: NOT DETECTED
CANNABINOID 50 NG, UR ~~LOC~~: NOT DETECTED
COCAINE METABOLITE, UR ~~LOC~~: NOT DETECTED
MDMA (Ecstasy)Ur Screen: NOT DETECTED
Methadone Scn, Ur: NOT DETECTED
Opiate, Ur Screen: POSITIVE — AB
Phencyclidine (PCP) Ur S: NOT DETECTED
Tricyclic, Ur Screen: NOT DETECTED

## 2016-03-18 LAB — BASIC METABOLIC PANEL
ANION GAP: 14 (ref 5–15)
Anion gap: 10 (ref 5–15)
BUN: 35 mg/dL — ABNORMAL HIGH (ref 6–20)
BUN: 32 mg/dL — ABNORMAL HIGH (ref 6–20)
CALCIUM: 7.5 mg/dL — AB (ref 8.9–10.3)
CALCIUM: 7.1 mg/dL — AB (ref 8.9–10.3)
CO2: 16 mmol/L — ABNORMAL LOW (ref 22–32)
CO2: 21 mmol/L — AB (ref 22–32)
CREATININE: 1.8 mg/dL — AB (ref 0.61–1.24)
Chloride: 100 mmol/L — ABNORMAL LOW (ref 101–111)
Chloride: 101 mmol/L (ref 101–111)
Creatinine, Ser: 2.45 mg/dL — ABNORMAL HIGH (ref 0.61–1.24)
GFR calc Af Amer: 46 mL/min — ABNORMAL LOW (ref >60)
GFR calc non Af Amer: 40 mL/min — ABNORMAL LOW (ref >60)
GFR, EST AFRICAN AMERICAN: 32 mL/min — AB (ref >60)
GFR, EST NON AFRICAN AMERICAN: 27 mL/min — AB (ref >60)
GLUCOSE: 135 mg/dL — AB (ref 65–99)
GLUCOSE: 114 mg/dL — AB (ref 65–99)
POTASSIUM: 4.5 mmol/L (ref 3.5–5.1)
Potassium: 3.8 mmol/L (ref 3.5–5.1)
Sodium: 130 mmol/L — ABNORMAL LOW (ref 135–145)
Sodium: 132 mmol/L — ABNORMAL LOW (ref 135–145)

## 2016-03-18 LAB — LACTIC ACID, PLASMA
LACTIC ACID, VENOUS: 1.5 mmol/L (ref 0.5–1.9)
Lactic Acid, Venous: 3.5 mmol/L (ref 0.5–1.9)

## 2016-03-18 LAB — GLUCOSE, CAPILLARY
GLUCOSE-CAPILLARY: 114 mg/dL — AB (ref 65–99)
GLUCOSE-CAPILLARY: 131 mg/dL — AB (ref 65–99)
Glucose-Capillary: 117 mg/dL — ABNORMAL HIGH (ref 65–99)
Glucose-Capillary: 121 mg/dL — ABNORMAL HIGH (ref 65–99)
Glucose-Capillary: 147 mg/dL — ABNORMAL HIGH (ref 65–99)

## 2016-03-18 MED ORDER — CIPROFLOXACIN IN D5W 400 MG/200ML IV SOLN: 400.0000 mg | Freq: Two times a day (BID) | INTRAVENOUS | Status: DC

## 2016-03-18 MED ORDER — ACETAMINOPHEN 650 MG RE SUPP: 650.0000 mg | Freq: Four times a day (QID) | RECTAL | Status: DC | PRN

## 2016-03-18 MED ORDER — SODIUM CHLORIDE 0.9 % IV BOLUS (SEPSIS)
1000.0000 mL | Freq: Once | INTRAVENOUS | Status: AC
  Administered 2016-03-18: 1000 mL via INTRAVENOUS

## 2016-03-18 MED ORDER — PANTOPRAZOLE SODIUM 40 MG IV SOLR
40.0000 mg | INTRAVENOUS | Status: DC
  Administered 2016-03-18 – 2016-03-20 (×3): 40 mg via INTRAVENOUS
  Filled 2016-03-18 (×3): qty 40

## 2016-03-18 MED ORDER — METRONIDAZOLE IN NACL 5-0.79 MG/ML-% IV SOLN
500.0000 mg | Freq: Once | INTRAVENOUS | Status: AC
  Administered 2016-03-18: 500 mg via INTRAVENOUS
  Filled 2016-03-18: qty 100

## 2016-03-18 MED ORDER — VITAMIN B-1 100 MG PO TABS
250.0000 mg | ORAL_TABLET | Freq: Every day | ORAL | Status: DC
  Administered 2016-03-18 – 2016-03-20 (×3): 250 mg via ORAL
  Filled 2016-03-18 (×3): qty 3

## 2016-03-18 MED ORDER — INSULIN ASPART 100 UNIT/ML ~~LOC~~ SOLN: 0.0000 [IU] | Freq: Every day | SUBCUTANEOUS | Status: DC

## 2016-03-18 MED ORDER — SODIUM CHLORIDE 0.9 % IV SOLN
INTRAVENOUS | Status: DC
  Administered 2016-03-18 – 2016-03-19 (×5): via INTRAVENOUS

## 2016-03-18 MED ORDER — ONDANSETRON HCL 4 MG/2ML IJ SOLN: 4.0000 mg | Freq: Four times a day (QID) | INTRAMUSCULAR | Status: DC | PRN

## 2016-03-18 MED ORDER — POLYVINYL ALCOHOL 1.4 % OP SOLN
1.0000 [drp] | OPHTHALMIC | Status: DC | PRN
  Administered 2016-03-18: 23:00:00 1 [drp] via OPHTHALMIC
  Filled 2016-03-18 (×2): qty 15

## 2016-03-18 MED ORDER — INSULIN ASPART 100 UNIT/ML ~~LOC~~ SOLN
0.0000 [IU] | Freq: Three times a day (TID) | SUBCUTANEOUS | Status: DC
  Administered 2016-03-18: 1 [IU] via SUBCUTANEOUS
  Filled 2016-03-18: qty 1

## 2016-03-18 MED ORDER — ACETAMINOPHEN 325 MG PO TABS: 650.0000 mg | ORAL_TABLET | Freq: Four times a day (QID) | ORAL | Status: DC | PRN

## 2016-03-18 MED ORDER — CIPROFLOXACIN IN D5W 400 MG/200ML IV SOLN
400.0000 mg | Freq: Once | INTRAVENOUS | Status: AC
  Administered 2016-03-18: 400 mg via INTRAVENOUS
  Filled 2016-03-18: qty 200

## 2016-03-18 MED ORDER — ONDANSETRON HCL 4 MG PO TABS: 4.0000 mg | ORAL_TABLET | Freq: Four times a day (QID) | ORAL | Status: DC | PRN

## 2016-03-18 MED ORDER — IOPAMIDOL (ISOVUE-300) INJECTION 61%
15.0000 mL | INTRAVENOUS | Status: AC
  Administered 2016-03-18 (×2): 15 mL via ORAL

## 2016-03-18 MED ORDER — PIPERACILLIN-TAZOBACTAM 3.375 G IVPB
3.3750 g | Freq: Three times a day (TID) | INTRAVENOUS | Status: DC
  Administered 2016-03-18 – 2016-03-19 (×4): 3.375 g via INTRAVENOUS
  Filled 2016-03-18 (×4): qty 50

## 2016-03-18 MED ORDER — MORPHINE SULFATE (PF) 2 MG/ML IV SOLN
2.0000 mg | INTRAVENOUS | Status: DC | PRN
  Administered 2016-03-18: 2 mg via INTRAVENOUS
  Filled 2016-03-18: qty 1

## 2016-03-18 MED ORDER — MORPHINE SULFATE (PF) 4 MG/ML IV SOLN
4.0000 mg | Freq: Once | INTRAVENOUS | Status: AC
Start: 2016-03-18 — End: 2016-03-18
  Administered 2016-03-18: 4 mg via INTRAVENOUS
  Filled 2016-03-18: qty 1

## 2016-03-18 NOTE — ED Notes (Signed)
Informed CT that patient has completed oral contrast.

## 2016-03-18 NOTE — Progress Notes (Signed)
[facility] - [place] [facility]   PATIENT NAME: George Gomez    MR#:  [id number]  DATE OF BIRTH:  Jun 06, 1960  SUBJECTIVE:  CHIEF COMPLAINT:   Chief Complaint  Patient presents with  . Abdominal Pain  Patient is a 56 year old African-American male with medical history significant with  alcohol abuse, diabetes, hyperlipidemia, hypertension, who presents to the hospital with complaints of nausea, vomiting, epigastric abdominal pain. No bleeding. CT scan of the emergency room revealed enteritis. Labs revealed acute renal failure, lactic acidosis, she was admitted. He feels a little bit better today, still has some nausea, no vomiting, no diarrheal stool.  Review of Systems  Constitutional: Positive for malaise/fatigue. Negative for chills, fever and weight loss.  HENT: Negative for congestion.   Eyes: Negative for blurred vision and double vision.  Respiratory: Negative for cough, sputum production, shortness of breath and wheezing.   Cardiovascular: Negative for chest pain, palpitations, orthopnea, leg swelling and PND.  Gastrointestinal: Positive for abdominal pain and nausea. Negative for blood in stool, constipation, diarrhea and vomiting.  Genitourinary: Negative for dysuria, frequency, hematuria and urgency.  Musculoskeletal: Negative for falls.  Neurological: Negative for dizziness, tremors, focal weakness and headaches.  Endo/Heme/Allergies: Does not bruise/bleed easily.  Psychiatric/Behavioral: Negative for depression. The patient does not have insomnia.     VITAL SIGNS: Blood pressure 111/60, pulse 83, temperature 98.3 F (36.8 C), temperature source Oral, resp. rate 16, height 5\' 11"  (1.803 m), weight 104.3 kg (230 lb), SpO2 97 %.  PHYSICAL EXAMINATION:   GENERAL:  56 y.o.-year-old patient lying in the bed in mild to moderate distress due to abdominal discomfort, nausea, intermittent burping, pale, uncomfortable.  EYES: Pupils equal, round,  reactive to light and accommodation. No scleral icterus. Extraocular muscles intact.  HEENT: Head atraumatic, normocephalic. Oropharynx and nasopharynx clear.  NECK:  Supple, no jugular venous distention. No thyroid enlargement, no tenderness.  LUNGS: Normal breath sounds bilaterally, no wheezing, rales,rhonchi or crepitation. No use of accessory muscles of respiration.  CARDIOVASCULAR: S1, S2 normal. No murmurs, rubs, or gallops.  ABDOMEN: Soft, tender above umbilical area, but no rebound or guarding, nondistended. Bowel sounds present. No organomegaly or mass.  EXTREMITIES: No pedal edema, cyanosis, or clubbing.  NEUROLOGIC: Cranial nerves II through XII are intact. Muscle strength 5/5 in all extremities. Sensation intact. Gait not checked.  PSYCHIATRIC: The patient is alert and oriented x 3.  SKIN: No obvious rash, lesion, or ulcer.   ORDERS/RESULTS REVIEWED:   CBC  Recent Labs Lab 03/17/16 2201 03/18/16 0628  WBC 7.5 6.2  HGB 13.0 10.7*  HCT 39.1* 32.3*  PLT 250 184  MCV 88.8 88.7  MCH 29.6 29.5  MCHC 33.4 33.3  RDW 15.3* 15.4*   ------------------------------------------------------------------------------------------------------------------  Chemistries   Recent Labs Lab 03/17/16 2201 03/18/16 0628  NA 137 130*  K 4.3 4.5  CL 97* 100*  CO2 12* 16*  GLUCOSE 109* 135*  BUN 34* 35*  CREATININE 2.78* 2.45*  CALCIUM 8.7* 7.5*  AST 68*  --   ALT 52  --   ALKPHOS 94  --   BILITOT 1.5*  --    ------------------------------------------------------------------------------------------------------------------ estimated creatinine clearance is 40.4 mL/min (by C-G formula based on SCr of 2.45 mg/dL (H)). ------------------------------------------------------------------------------------------------------------------ No results for input(s): TSH, T4TOTAL, T3FREE, THYROIDAB in the last 72 hours.  Invalid input(s): FREET3  Cardiac Enzymes  Recent Labs Lab  03/17/16 2201  TROPONINI <0.03   ------------------------------------------------------------------------------------------------------------------ Invalid input(s): POCBNP ---------------------------------------------------------------------------------------------------------------  RADIOLOGY: Ct Abdomen Pelvis  Wo Contrast  Result Date: 03/18/2016 CLINICAL DATA:  Left lower quadrant abdominal pain EXAM: CT ABDOMEN AND PELVIS WITHOUT CONTRAST TECHNIQUE: Multidetector CT imaging of the abdomen and pelvis was performed following the standard protocol without IV contrast. COMPARISON:  08/01/2013 FINDINGS: Lower chest: Lung bases demonstrate mild linear atelectasis in the right middle lobe and right lung base. No acute consolidation or effusion. Coronary artery calcifications. 9 mm prevascular space lymph node. Normal heart size. Moderate gynecomastia. Hepatobiliary: Hepatic steatosis with heterogeneous fatty infiltration. No biliary dilatation. No calcified gallstones. Pancreas: Unremarkable. No pancreatic ductal dilatation or surrounding inflammatory changes. Spleen: Normal in size without focal abnormality. Adrenals/Urinary Tract: Adrenal glands are unremarkable. Kidneys are normal, without renal calculi, focal lesion, or hydronephrosis. Bladder is mildly thick-walled in appearance. Stomach/Bowel: The stomach is nonenlarged. There is large amount of contrast in the stomach. Thickened small bowel loops are present within the central abdomen and left lower quadrant. Edema and inflammation within the mesenteric fat surrounding the thickened bowel loops. No pneumatosis. Small bowel loops do not appear dilated. Appendix is visualized and is normal. No colon wall thickening. Sigmoid colon diverticulosis without acute inflammation. Vascular/Lymphatic: Aortic atherosclerosis. No enlarged abdominal or pelvic lymph nodes. Reproductive: Prostate is unremarkable. Other: Small amount of ascites adjacent to the spleen  and liver. No free air. Musculoskeletal: Degenerative changes of the spine. No acute or suspicious bone lesion. IMPRESSION: 1. Thickened inflamed loops of small bowel within the central abdomen and left lower quadrant. Findings could be secondary to infection, inflammation, or ischemia. 2. Sigmoid colon diverticulosis without acute diverticulitis. 3. Heterogenous fatty liver. 4. Small amount of ascites adjacent to the spleen Electronically Signed   By: Jasmine Pang M.D.   On: 03/18/2016 02:12   Dg Chest Portable 1 View  Result Date: 03/18/2016 CLINICAL DATA:  Left lower quadrant abdominal pain EXAM: PORTABLE CHEST 1 VIEW COMPARISON:  02/23/2015 FINDINGS: Stable mild elevation of right diaphragm with right basilar atelectasis. No consolidation or effusion. Stable mild cardiomegaly. No pneumothorax. IMPRESSION: Stable elevation of right diaphragm with atelectasis at the right base. No acute infiltrate. Electronically Signed   By: Jasmine Pang M.D.   On: 03/18/2016 00:45    EKG:  Orders placed or performed during the hospital encounter of 03/17/16  . EKG 12-Lead  . EKG 12-Lead    ASSESSMENT AND PLAN:  Active Problems:   Enteritis #1. Acute gastroenteritis of unclear etiology at this time, get stool cultures, including C. difficile, supportive therapy with IV antibiotics, IV fluids, clear liquid diet, PPI #2. Acute on chronic renal failure, improving with IV fluids #3. Lactic acidosis, improved #4. Anemia. With rehydration, get Hemoccult, follow hemoglobin level with hydration #5. Hyponatremia with dehydration, questionable SIADH, repeat labs, discontinue IV fluids if hyponatremia worsens.  #6. Alcohol abuse, which were withdrawal, initiate alcohol withdrawal scale as needed., Starting thiamine  Management plans discussed with the patient, family and they are in agreement.   DRUG ALLERGIES: No Known Allergies  CODE STATUS:     Code Status Orders        Start     Ordered   03/18/16  0428  Full code  Continuous     03/18/16 0427    Code Status History    Date Active Date Inactive Code Status Order ID Comments User Context   This patient has a current code status but no historical code status.      TOTAL TIME TAKING CARE OF THIS PATIENT: 40 minutes.    Katharina Caper M.D on  03/18/2016 at 3:42 PM  Between 7am to 6pm - Pager - [phone]  After 6pm go to [email or web address] - password [email or web address]  [facility]  Office  [phone]  CC: Primary care physician; [facility]

## 2016-03-18 NOTE — Progress Notes (Signed)
Pharmacy Antibiotic Note  George Gomez is a 56 y.o. male admitted on 03/17/2016 with intra-abdominal infection.  Pharmacy has been consulted for Zosyn dosing.  Plan: Zosyn 3.375g IV q8h (4 hour infusion).  Height: 5\' 11"  (180.3 cm) Weight: 230 lb (104.3 kg) IBW/kg (Calculated) : 75.3  Temp (24hrs), Avg:98.4 F (36.9 C), Min:98.4 F (36.9 C), Max:98.4 F (36.9 C)   Recent Labs Lab 03/17/16 2201 03/18/16 0004 03/18/16 0251  WBC 7.5  --   --   CREATININE 2.78*  --   --   LATICACIDVEN  --  3.5* 1.5    Estimated Creatinine Clearance: 35.6 mL/min (by C-G formula based on SCr of 2.78 mg/dL (H)).    No Known Allergies  Antimicrobials this admission: cipro flagyl 2/19 >> Zosyn 2/20   >>   Dose adjustments this admission:   Microbiology results:    Thank you for allowing pharmacy to be a part of this patient's care.  Rory Xiang S 03/18/2016 3:44 AM

## 2016-03-18 NOTE — H&P (Signed)
[facility] [place] [facility]   PATIENT NAME: George Gomez    MR#:  [id number]  DATE OF BIRTH:  09/11/1960  DATE OF ADMISSION:  03/17/2016  PRIMARY CARE PHYSICIAN: [facility]   REQUESTING/REFERRING PHYSICIAN:   CHIEF COMPLAINT:   Chief Complaint  Patient presents with  . Abdominal Pain    HISTORY OF PRESENT ILLNESS: George Gomez  is a 56 y.o. male with a known history of Diabetes mellitus type 2, hyperlipidemia, hypertension presented to the emergency room with abdominal pain since yesterday. The abdominal pain is located in the left lower quadrant. The pain is sharp in nature 6 out of 10 on a scale of 1-10. Patient also has nausea and vomiting. Vomitus contains food and water. Patient also had watery diarrhea. No history of any travel. No history of any vomiting of blood or bleeding per rectum. Patient was worked up in the emergency room with CT scan of the abdomen which showed enteritis. He had elevated anion gap and the bedside elevated lactate level. Patient was given IV ciprofloxacin and IV Flagyl antibiotics in the emergency room. Surgical consultation was done by emergency room physician for abdominal pain, who recommended IV fluids and antibiotics. Hospitalist service was consulted for further care of the patient.  PAST MEDICAL HISTORY:   Past Medical History:  Diagnosis Date  . Anemia   . Diabetes mellitus without complication (HCC)   . Hypercholesteremia   . Hypertension     PAST SURGICAL HISTORY: Past Surgical History:  Procedure Laterality Date  . none      SOCIAL HISTORY:  Social History  Substance Use Topics  . Smoking status: Never Smoker  . Smokeless tobacco: Never Used  . Alcohol use Yes    FAMILY HISTORY:  Family History  Problem Relation Age of Onset  . Diabetes Father     DRUG ALLERGIES: No Known Allergies  REVIEW OF SYSTEMS:   CONSTITUTIONAL: No fever, has weakness.  EYES: No blurred or  double vision.  EARS, NOSE, AND THROAT: No tinnitus or ear pain.  RESPIRATORY: No cough, shortness of breath, wheezing or hemoptysis.  CARDIOVASCULAR: No chest pain, orthopnea, edema.  GASTROINTESTINAL: Has nausea, vomiting, diarrhea and abdominal pain.  GENITOURINARY: No dysuria, hematuria.  ENDOCRINE: No polyuria, nocturia,  HEMATOLOGY: No anemia, easy bruising or bleeding SKIN: No rash or lesion. MUSCULOSKELETAL: No joint pain or arthritis.   NEUROLOGIC: No tingling, numbness, weakness.  PSYCHIATRY: No anxiety or depression.   MEDICATIONS AT HOME:  Prior to Admission medications   Medication Sig Start Date End Date Taking? Authorizing Provider  allopurinol (ZYLOPRIM) 300 MG tablet Take 1 tablet by mouth daily. 02/21/16  Yes Historical Provider, MD  amLODipine (NORVASC) 5 MG tablet Take 5 tablets by mouth daily. 03/05/16  Yes Historical Provider, MD  atorvastatin (LIPITOR) 40 MG tablet Take 1 tablet by mouth daily. 02/21/16  Yes Historical Provider, MD  Cyanocobalamin (VITAMIN B-12 IJ) Inject 1 Dose as directed every 30 (thirty) days.   Yes Historical Provider, MD  gabapentin (NEURONTIN) 300 MG capsule Take 300 mg by mouth 2 (two) times daily. 02/18/16  Yes Historical Provider, MD  lisinopril (PRINIVIL,ZESTRIL) 40 MG tablet Take 40 mg by mouth daily. for blood pressure 02/21/16  Yes Historical Provider, MD  metFORMIN (GLUCOPHAGE-XR) 500 MG 24 hr tablet Take 2 tablets by mouth daily. 03/05/16  Yes Historical Provider, MD  vitamin B-12 (CYANOCOBALAMIN) 1000 MCG tablet Take 1 tablet by mouth as needed. 12/24/15  Yes Historical  Provider, MD      PHYSICAL EXAMINATION:   VITAL SIGNS: Blood pressure 121/70, pulse 96, temperature 98.4 F (36.9 C), temperature source Oral, resp. rate (!) 30, SpO2 99 %.  GENERAL:  56 y.o.-year-old patient lying in the bed with no acute distress.  EYES: Pupils equal, round, reactive to light and accommodation. No scleral icterus. Extraocular muscles intact.  HEENT:  Head atraumatic, normocephalic. Oropharynx dry and nasopharynx clear.  NECK:  Supple, no jugular venous distention. No thyroid enlargement, no tenderness.  LUNGS: Normal breath sounds bilaterally, no wheezing, rales,rhonchi or crepitation. No use of accessory muscles of respiration.  CARDIOVASCULAR: S1, S2 normal. No murmurs, rubs, or gallops.  ABDOMEN: Soft, tenderness in the left lower quadrant , nondistended. Bowel sounds present. No organomegaly or mass.  EXTREMITIES: No pedal edema, cyanosis, or clubbing.  NEUROLOGIC: Cranial nerves II through XII are intact. Muscle strength 5/5 in all extremities. Sensation intact. Gait not checked.  PSYCHIATRIC: The patient is alert and oriented x 3.  SKIN: No obvious rash, lesion, or ulcer.   LABORATORY PANEL:   CBC  Recent Labs Lab 03/17/16 2201  WBC 7.5  HGB 13.0  HCT 39.1*  PLT 250  MCV 88.8  MCH 29.6  MCHC 33.4  RDW 15.3*   ------------------------------------------------------------------------------------------------------------------  Chemistries   Recent Labs Lab 03/17/16 2201  NA 137  K 4.3  CL 97*  CO2 12*  GLUCOSE 109*  BUN 34*  CREATININE 2.78*  CALCIUM 8.7*  AST 68*  ALT 52  ALKPHOS 94  BILITOT 1.5*   ------------------------------------------------------------------------------------------------------------------ CrCl cannot be calculated (Unknown ideal weight.). ------------------------------------------------------------------------------------------------------------------ No results for input(s): TSH, T4TOTAL, T3FREE, THYROIDAB in the last 72 hours.  Invalid input(s): FREET3   Coagulation profile No results for input(s): INR, PROTIME in the last 168 hours. ------------------------------------------------------------------------------------------------------------------- No results for input(s): DDIMER in the last 72  hours. -------------------------------------------------------------------------------------------------------------------  Cardiac Enzymes  Recent Labs Lab 03/17/16 2201  TROPONINI <0.03   ------------------------------------------------------------------------------------------------------------------ Invalid input(s): POCBNP  ---------------------------------------------------------------------------------------------------------------  Urinalysis    Component Value Date/Time   COLORURINE AMBER (A) 03/18/2016 0004   APPEARANCEUR CLOUDY (A) 03/18/2016 0004   APPEARANCEUR Clear 09/19/2013 2009   LABSPEC 1.019 03/18/2016 0004   LABSPEC 1.009 09/19/2013 2009   PHURINE 5.0 03/18/2016 0004   GLUCOSEU 50 (A) 03/18/2016 0004   GLUCOSEU Negative 09/19/2013 2009   HGBUR NEGATIVE 03/18/2016 0004   BILIRUBINUR SMALL (A) 03/18/2016 0004   BILIRUBINUR Negative 09/19/2013 2009   KETONESUR 20 (A) 03/18/2016 0004   PROTEINUR 100 (A) 03/18/2016 0004   NITRITE NEGATIVE 03/18/2016 0004   LEUKOCYTESUR NEGATIVE 03/18/2016 0004   LEUKOCYTESUR Negative 09/19/2013 2009     RADIOLOGY: Ct Abdomen Pelvis Wo Contrast  Result Date: 03/18/2016 CLINICAL DATA:  Left lower quadrant abdominal pain EXAM: CT ABDOMEN AND PELVIS WITHOUT CONTRAST TECHNIQUE: Multidetector CT imaging of the abdomen and pelvis was performed following the standard protocol without IV contrast. COMPARISON:  08/01/2013 FINDINGS: Lower chest: Lung bases demonstrate mild linear atelectasis in the right middle lobe and right lung base. No acute consolidation or effusion. Coronary artery calcifications. 9 mm prevascular space lymph node. Normal heart size. Moderate gynecomastia. Hepatobiliary: Hepatic steatosis with heterogeneous fatty infiltration. No biliary dilatation. No calcified gallstones. Pancreas: Unremarkable. No pancreatic ductal dilatation or surrounding inflammatory changes. Spleen: Normal in size without focal abnormality.  Adrenals/Urinary Tract: Adrenal glands are unremarkable. Kidneys are normal, without renal calculi, focal lesion, or hydronephrosis. Bladder is mildly thick-walled in appearance. Stomach/Bowel: The stomach is nonenlarged. There is large amount of  contrast in the stomach. Thickened small bowel loops are present within the central abdomen and left lower quadrant. Edema and inflammation within the mesenteric fat surrounding the thickened bowel loops. No pneumatosis. Small bowel loops do not appear dilated. Appendix is visualized and is normal. No colon wall thickening. Sigmoid colon diverticulosis without acute inflammation. Vascular/Lymphatic: Aortic atherosclerosis. No enlarged abdominal or pelvic lymph nodes. Reproductive: Prostate is unremarkable. Other: Small amount of ascites adjacent to the spleen and liver. No free air. Musculoskeletal: Degenerative changes of the spine. No acute or suspicious bone lesion. IMPRESSION: 1. Thickened inflamed loops of small bowel within the central abdomen and left lower quadrant. Findings could be secondary to infection, inflammation, or ischemia. 2. Sigmoid colon diverticulosis without acute diverticulitis. 3. Heterogenous fatty liver. 4. Small amount of ascites adjacent to the spleen Electronically Signed   By: Jasmine Pang M.D.   On: 03/18/2016 02:12   Dg Chest Portable 1 View  Result Date: 03/18/2016 CLINICAL DATA:  Left lower quadrant abdominal pain EXAM: PORTABLE CHEST 1 VIEW COMPARISON:  02/23/2015 FINDINGS: Stable mild elevation of right diaphragm with right basilar atelectasis. No consolidation or effusion. Stable mild cardiomegaly. No pneumothorax. IMPRESSION: Stable elevation of right diaphragm with atelectasis at the right base. No acute infiltrate. Electronically Signed   By: Jasmine Pang M.D.   On: 03/18/2016 00:45    EKG: Orders placed or performed during the hospital encounter of 03/17/16  . EKG 12-Lead  . EKG 12-Lead    IMPRESSION AND  PLAN: 56 year old male patient with history of diabetes mellitus, hypertension, hyperlipidemia presented to the emergency room with abdominal discomfort, nausea, vomiting and diarrhea. Admitting diagnosis 1. Acute enteritis 2. Metabolic acidosis 3. Acute kidney injury 4. Dehydration 5. Abnormal liver function tests Treatment plan Admit patient to medical floor IV fluid hydration Keep patient nothing by mouth Start patient on IV Zosyn antibiotic Surgical consultation Follow-up renal function DVT prophylaxis sequential compression device to lower extremities Follow-up  Electrolytes Supportive care  All the records are reviewed and case discussed with ED provider. Management plans discussed with the patient, family and they are in agreement.  CODE STATUS:FULL CODE Code Status History    This patient does not have a recorded code status. Please follow your organizational policy for patients in this situation.       TOTAL TIME TAKING CARE OF THIS PATIENT: 52 minutes.    Ihor Austin M.D on 03/18/2016 at 3:37 AM  Between 7am to 6pm - Pager - [phone]  After 6pm go to [email or web address] - password [email or web address] [facility]  [place] [facility]  Office  [phone]  CC: Primary care physician; [facility]

## 2016-03-18 NOTE — ED Notes (Signed)
Patient transported to CT 

## 2016-03-18 NOTE — ED Provider Notes (Signed)
[facility] Emergency Department Provider Note   ____________________________________________   First MD Initiated Contact with Patient 03/17/16 2349     (approximate)  I have reviewed the triage vital signs and the nursing notes.   HISTORY  Chief Complaint Abdominal Pain    HPI George Gomez is a 56 y.o. male who comes into the hospital today with abdominal pain. He reports that he's also blacked out 3 times today. The patient reports that the vomiting started today as well as the left lower quadrant abdominal pain. He states he is also felt some tightness in his chest. The patient rates his pain a 7 out of 10 in intensity. He has never had anything like this before. He reports that he drank a lot of alcohol over the weekend but this again started today. The patient has been urinating well and reports she is also had diarrhea all day. He states that his emesis was green appearing and the diarrhea was brown and watery. He also states he had some dark and green appearing stool as well. The patient could not tolerate the pain anymore and was his feeling as if something was in his chest so he decided to come into the hospital. The patient reports that his abdomen is also distended.   Past Medical History:  Diagnosis Date  . Anemia   . Diabetes mellitus without complication (HCC)   . Hypercholesteremia   . Hypertension     There are no active problems to display for this patient.   History reviewed. No pertinent surgical history.  Prior to Admission medications   Medication Sig Start Date End Date Taking? Authorizing Provider  famotidine (PEPCID) 20 MG tablet Take 1 tablet (20 mg total) by mouth daily. 02/23/15 02/23/16  Emily FilbertJonathan E Williams, MD  promethazine (PHENERGAN) 25 MG tablet Take 1 tablet (25 mg total) by mouth every 6 (six) hours as needed for nausea or vomiting. 02/23/15   Emily FilbertJonathan E Williams, MD    Allergies Patient has no known  allergies.  History reviewed. No pertinent family history.  Social History Social History  Substance Use Topics  . Smoking status: Never Smoker  . Smokeless tobacco: Not on file  . Alcohol use Yes    Review of Systems Constitutional: No fever/chills Eyes: No visual changes. ENT: No sore throat. Cardiovascular chest pain. Respiratory:  shortness of breath. Gastrointestinal: abdominal pain, nausea, vomiting, diarrhea.  No constipation. Genitourinary: Negative for dysuria. Musculoskeletal: Negative for back pain. Skin: Negative for rash. Neurological: Syncope  10-point ROS otherwise negative.  ____________________________________________   PHYSICAL EXAM:  VITAL SIGNS: ED Triage Vitals  Enc Vitals Group     BP 03/17/16 2157 134/88     Pulse Rate 03/17/16 2157 (!) 116     Resp 03/17/16 2157 (!) 24     Temp 03/17/16 2157 98.4 F (36.9 C)     Temp Source 03/17/16 2157 Oral     SpO2 03/17/16 2157 96 %     Weight --      Height --      Head Circumference --      Peak Flow --      Pain Score 03/17/16 2159 10     Pain Loc --      Pain Edu? --      Excl. in GC? --     Constitutional: Alert and oriented. Well appearing and in Moderate distress. Eyes: Conjunctivae are normal. PERRL. EOMI. Head: Atraumatic. Nose: No congestion/rhinnorhea. Mouth/Throat: Mucous membranes  are moist.  Oropharynx non-erythematous. Cardiovascular: Tachycardia regular rhythm. Grossly normal heart sounds.  Good peripheral circulation. Respiratory: Normal respiratory effort.  No retractions. Lungs CTAB. Gastrointestinal: Soft with some left lower quadrant abdominal pain, mild distention. Positive bowel sounds Musculoskeletal: No lower extremity tenderness nor edema.   Neurologic:  Normal speech and language.  Skin:  Skin is warm, dry and intact.  Psychiatric: Mood and affect are normal.   ____________________________________________   LABS (all labs ordered are listed, but only abnormal  results are displayed)  Labs Reviewed  COMPREHENSIVE METABOLIC PANEL - Abnormal; Notable for the following:       Result Value   Chloride 97 (*)    CO2 12 (*)    Glucose, Bld 109 (*)    BUN 34 (*)    Creatinine, Ser 2.78 (*)    Calcium 8.7 (*)    Total Protein 8.3 (*)    AST 68 (*)    Total Bilirubin 1.5 (*)    GFR calc non Af Amer 24 (*)    GFR calc Af Amer 27 (*)    Anion gap 28 (*)    All other components within normal limits  CBC - Abnormal; Notable for the following:    HCT 39.1 (*)    RDW 15.3 (*)    All other components within normal limits  LACTIC ACID, PLASMA - Abnormal; Notable for the following:    Lactic Acid, Venous 3.5 (*)    All other components within normal limits  LIPASE, BLOOD  TROPONIN I  URINALYSIS, COMPLETE (UACMP) WITH MICROSCOPIC  LACTIC ACID, PLASMA   ____________________________________________  EKG  ED ECG REPORT I, Rebecka Apley, the attending physician, personally viewed and interpreted this ECG.   Date: 03/18/2016  EKG Time: 2206  Rate: 104  Rhythm: sinus tachycardia  Axis: left axis deviation  Intervals:none  ST&T Change: none  ____________________________________________  RADIOLOGY  CXR CT abd and pelvis ____________________________________________   PROCEDURES  Procedure(s) performed: None  Procedures  Critical Care performed: No  ____________________________________________   INITIAL IMPRESSION / ASSESSMENT AND PLAN / ED COURSE  Pertinent labs & imaging results that were available during my care of the patient were reviewed by me and considered in my medical decision making (see chart for details).  This is a 56 year old male who comes into the hospital today with some abdominal pain. The patient has also had some vomiting and diarrhea. He also states that he's had some chest pain. I will give the patient a liter of normal saline as well as some morphine and Zofran. The patient appears to have some acute  renal insufficiency as well as some acidosis with a bicarbonate of 12 and anion gap of 28. I will send a lactic acid as well as a troponin given the patient's pain. I will also send the patient for chest x-ray and do a CT scan. The patient will be reassessed and I received some of his other results.  Clinical Course as of Mar 18 236  Tue Mar 18, 2016  0220 1. Thickened inflamed loops of small bowel within the central abdomen and left lower quadrant. Findings could be secondary to infection, inflammation, or ischemia. 2. Sigmoid colon diverticulosis without acute diverticulitis. 3. Heterogenous fatty liver. 4. Small amount of ascites adjacent to the spleen   CT Abdomen Pelvis Wo Contrast [AW]  0223 Stable elevation of right diaphragm with atelectasis at the right base. No acute infiltrate.   DG Chest Portable 1 View [AW]  Clinical Course User Index [AW] Rebecka Apley, MD    Received the results of the CT scan. The patient could not receive a CT angiogram or a CT with IV contrast because of his GFR. The patient has some small bowel thickening with a concern for possible enteritis. I did ask the surgeon to take a look at the CT and he agrees with that diagnosis. I will give the patient a dose of ciprofloxacin and Flagyl and given his acidosis as well as acute renal insufficiency I will admit him to the hospitalist service. ____________________________________________   FINAL CLINICAL IMPRESSION(S) / ED DIAGNOSES  Final diagnoses:  Enteritis  Lactic acidosis  Acute renal insufficiency      NEW MEDICATIONS STARTED DURING THIS VISIT:  New Prescriptions   No medications on file     Note:  This document was prepared using Dragon voice recognition software and may include unintentional dictation errors.    Rebecka Apley, MD 03/18/16 [phone]

## 2016-03-18 NOTE — Progress Notes (Signed)
Patient feels much better this afternoon. He has minimal if any abdominal pain and wants to eat.  Vital signs are stable No acute distress Abdomen is soft slightly distended nontender no peritoneal signs  Abdominal pain of unclear etiology we will advance diet and as there are no signs of acute surgical needs we'll sign off.

## 2016-03-18 NOTE — Consult Note (Addendum)
Date of Consultation:  03/18/2016  Requesting Physician:  Ihor Austin, MD  Reason for Consultation:  Abdominal pain  History of Present Illness: George Gomez is a 56 y.o. male who presents with a 1 day history of abdominal pain, nausea, vomiting, and diarrhea. Patient has had 2 prior visits to the emergency room noted on his medical records noted for similar symptoms after episodes of drinking. Patient reports that he was drinking this weekend and started having pain yesterday morning. Because of the persistent diarrhea vomiting and abdominal pain he presents emergency room. He reports feeling feverish with sweats and chills, with some chest pain in the low chest/upper abdomen and some shortness of breath due to the pain.  Workup in the emergency room revealed acute renal failure with a creatinine of 2.78 with a normal white blood cell count and lactic acid of 3.5. He also had a CAT scan which showed thickened loops of small bowel in the mid abdomen and left lower quadrant but otherwise no acute pathology.   Past Medical History: Past Medical History:  Diagnosis Date  . Anemia   . Diabetes mellitus without complication (HCC)   . Hypercholesteremia   . Hypertension Alcohol use      Past Surgical History: Past Surgical History:  Procedure Laterality Date  . none      Home Medications: Prior to Admission medications   Medication Sig Start Date End Date Taking? Authorizing Provider  allopurinol (ZYLOPRIM) 300 MG tablet Take 1 tablet by mouth daily. 02/21/16  Yes Historical Provider, MD  amLODipine (NORVASC) 5 MG tablet Take 5 tablets by mouth daily. 03/05/16  Yes Historical Provider, MD  atorvastatin (LIPITOR) 40 MG tablet Take 1 tablet by mouth daily. 02/21/16  Yes Historical Provider, MD  Cyanocobalamin (VITAMIN B-12 IJ) Inject 1 Dose as directed every 30 (thirty) days.   Yes Historical Provider, MD  gabapentin (NEURONTIN) 300 MG capsule Take 300 mg by mouth 2 (two) times daily.  02/18/16  Yes Historical Provider, MD  lisinopril (PRINIVIL,ZESTRIL) 40 MG tablet Take 40 mg by mouth daily. for blood pressure 02/21/16  Yes Historical Provider, MD  metFORMIN (GLUCOPHAGE-XR) 500 MG 24 hr tablet Take 2 tablets by mouth daily. 03/05/16  Yes Historical Provider, MD  vitamin B-12 (CYANOCOBALAMIN) 1000 MCG tablet Take 1 tablet by mouth as needed. 12/24/15  Yes Historical Provider, MD    Allergies: No Known Allergies  Social History:  reports that he has never smoked. He has never used smokeless tobacco. He reports that he drinks alcohol. He reports that he does not use drugs.   Family History: Family History  Problem Relation Age of Onset  . Diabetes Father     Review of Systems: Review of Systems  Constitutional: Positive for chills.  HENT: Negative for hearing loss.   Eyes: Negative for blurred vision.  Respiratory: Positive for shortness of breath. Negative for cough.   Cardiovascular: Positive for chest pain. Negative for leg swelling.  Gastrointestinal: Positive for abdominal pain, diarrhea, nausea and vomiting. Negative for heartburn.  Genitourinary: Negative for dysuria.  Musculoskeletal: Negative for myalgias.  Skin: Negative for rash.  Neurological: Negative for dizziness.  Psychiatric/Behavioral: Negative for depression.  All other systems reviewed and are negative.   Physical Exam BP (!) 115/52 (BP Location: Right Arm)   Pulse 87   Temp 98.2 F (36.8 C) (Oral)   Resp 20   Ht 5\' 11"  (1.803 m)   Wt 104.3 kg (230 lb)   SpO2 97%   BMI 32.08  kg/m  CONSTITUTIONAL: No acute distress HEENT:  Normocephalic, atraumatic, extraocular motion intact. NECK: Trachea is midline, and there is no jugular venous distension.  RESPIRATORY:  Lungs are clear, and breath sounds are equal bilaterally. Normal respiratory effort without pathologic use of accessory muscles. CARDIOVASCULAR: Heart is regular without murmurs, gallops, or rubs. GI: The abdomen is soft, mild  distended, mild tender to palpation over mid abdomen. There were no palpable masses.  MUSCULOSKELETAL:  Normal muscle strength and tone in all four extremities.  No peripheral edema or cyanosis. SKIN: Skin turgor is normal. There are no pathologic skin lesions.  NEUROLOGIC:  Motor and sensation is grossly normal.  Cranial nerves are grossly intact. PSYCH:  Alert and oriented to person, place and time. Affect is normal.  Laboratory Analysis: Results for orders placed or performed during the hospital encounter of 03/17/16 (from the past 24 hour(s))  Lipase, blood     Status: None   Collection Time: 03/17/16 10:01 PM  Result Value Ref Range   Lipase 44 11 - 51 U/L  Comprehensive metabolic panel     Status: Abnormal   Collection Time: 03/17/16 10:01 PM  Result Value Ref Range   Sodium 137 135 - 145 mmol/L   Potassium 4.3 3.5 - 5.1 mmol/L   Chloride 97 (L) 101 - 111 mmol/L   CO2 12 (L) 22 - 32 mmol/L   Glucose, Bld 109 (H) 65 - 99 mg/dL   BUN 34 (H) 6 - 20 mg/dL   Creatinine, Ser 2.842.78 (H) 0.61 - 1.24 mg/dL   Calcium 8.7 (L) 8.9 - 10.3 mg/dL   Total Protein 8.3 (H) 6.5 - 8.1 g/dL   Albumin 4.4 3.5 - 5.0 g/dL   AST 68 (H) 15 - 41 U/L   ALT 52 17 - 63 U/L   Alkaline Phosphatase 94 38 - 126 U/L   Total Bilirubin 1.5 (H) 0.3 - 1.2 mg/dL   GFR calc non Af Amer 24 (L) >60 mL/min   GFR calc Af Amer 27 (L) >60 mL/min   Anion gap 28 (H) 5 - 15  CBC     Status: Abnormal   Collection Time: 03/17/16 10:01 PM  Result Value Ref Range   WBC 7.5 3.8 - 10.6 K/uL   RBC 4.40 4.40 - 5.90 MIL/uL   Hemoglobin 13.0 13.0 - 18.0 g/dL   HCT 13.239.1 (L) 44.040.0 - 10.252.0 %   MCV 88.8 80.0 - 100.0 fL   MCH 29.6 26.0 - 34.0 pg   MCHC 33.4 32.0 - 36.0 g/dL   RDW 72.515.3 (H) 36.611.5 - 44.014.5 %   Platelets 250 150 - 440 K/uL  Troponin I     Status: None   Collection Time: 03/17/16 10:01 PM  Result Value Ref Range   Troponin I <0.03 <0.03 ng/mL  Urinalysis, Complete w Microscopic     Status: Abnormal   Collection Time:  03/18/16 12:04 AM  Result Value Ref Range   Color, Urine AMBER (A) YELLOW   APPearance CLOUDY (A) CLEAR   Specific Gravity, Urine 1.019 1.005 - 1.030   pH 5.0 5.0 - 8.0   Glucose, UA 50 (A) NEGATIVE mg/dL   Hgb urine dipstick NEGATIVE NEGATIVE   Bilirubin Urine SMALL (A) NEGATIVE   Ketones, ur 20 (A) NEGATIVE mg/dL   Protein, ur 347100 (A) NEGATIVE mg/dL   Nitrite NEGATIVE NEGATIVE   Leukocytes, UA NEGATIVE NEGATIVE   RBC / HPF 0-5 0 - 5 RBC/hpf   WBC, UA 6-30 0 - 5  WBC/hpf   Bacteria, UA NONE SEEN NONE SEEN   Squamous Epithelial / LPF 0-5 (A) NONE SEEN   Mucous PRESENT    Hyaline Casts, UA PRESENT   Lactic acid, plasma     Status: Abnormal   Collection Time: 03/18/16 12:04 AM  Result Value Ref Range   Lactic Acid, Venous 3.5 (HH) 0.5 - 1.9 mmol/L  Lactic acid, plasma     Status: None   Collection Time: 03/18/16  2:51 AM  Result Value Ref Range   Lactic Acid, Venous 1.5 0.5 - 1.9 mmol/L  CBC     Status: Abnormal   Collection Time: 03/18/16  6:28 AM  Result Value Ref Range   WBC 6.2 3.8 - 10.6 K/uL   RBC 3.64 (L) 4.40 - 5.90 MIL/uL   Hemoglobin 10.7 (L) 13.0 - 18.0 g/dL   HCT 57.8 (L) 46.9 - 62.9 %   MCV 88.7 80.0 - 100.0 fL   MCH 29.5 26.0 - 34.0 pg   MCHC 33.3 32.0 - 36.0 g/dL   RDW 52.8 (H) 41.3 - 24.4 %   Platelets 184 150 - 440 K/uL    Imaging: Ct Abdomen Pelvis Wo Contrast  Result Date: 03/18/2016 CLINICAL DATA:  Left lower quadrant abdominal pain EXAM: CT ABDOMEN AND PELVIS WITHOUT CONTRAST TECHNIQUE: Multidetector CT imaging of the abdomen and pelvis was performed following the standard protocol without IV contrast. COMPARISON:  08/01/2013 FINDINGS: Lower chest: Lung bases demonstrate mild linear atelectasis in the right middle lobe and right lung base. No acute consolidation or effusion. Coronary artery calcifications. 9 mm prevascular space lymph node. Normal heart size. Moderate gynecomastia. Hepatobiliary: Hepatic steatosis with heterogeneous fatty infiltration. No  biliary dilatation. No calcified gallstones. Pancreas: Unremarkable. No pancreatic ductal dilatation or surrounding inflammatory changes. Spleen: Normal in size without focal abnormality. Adrenals/Urinary Tract: Adrenal glands are unremarkable. Kidneys are normal, without renal calculi, focal lesion, or hydronephrosis. Bladder is mildly thick-walled in appearance. Stomach/Bowel: The stomach is nonenlarged. There is large amount of contrast in the stomach. Thickened small bowel loops are present within the central abdomen and left lower quadrant. Edema and inflammation within the mesenteric fat surrounding the thickened bowel loops. No pneumatosis. Small bowel loops do not appear dilated. Appendix is visualized and is normal. No colon wall thickening. Sigmoid colon diverticulosis without acute inflammation. Vascular/Lymphatic: Aortic atherosclerosis. No enlarged abdominal or pelvic lymph nodes. Reproductive: Prostate is unremarkable. Other: Small amount of ascites adjacent to the spleen and liver. No free air. Musculoskeletal: Degenerative changes of the spine. No acute or suspicious bone lesion. IMPRESSION: 1. Thickened inflamed loops of small bowel within the central abdomen and left lower quadrant. Findings could be secondary to infection, inflammation, or ischemia. 2. Sigmoid colon diverticulosis without acute diverticulitis. 3. Heterogenous fatty liver. 4. Small amount of ascites adjacent to the spleen Electronically Signed   By: Jasmine Pang M.D.   On: 03/18/2016 02:12   Dg Chest Portable 1 View  Result Date: 03/18/2016 CLINICAL DATA:  Left lower quadrant abdominal pain EXAM: PORTABLE CHEST 1 VIEW COMPARISON:  02/23/2015 FINDINGS: Stable mild elevation of right diaphragm with right basilar atelectasis. No consolidation or effusion. Stable mild cardiomegaly. No pneumothorax. IMPRESSION: Stable elevation of right diaphragm with atelectasis at the right base. No acute infiltrate. Electronically Signed   By:  Jasmine Pang M.D.   On: 03/18/2016 00:45    Assessment and Plan: This is a 56 y.o. male who presents with a one-day history of abdominal pain, with nausea, vomiting, and diarrhea.  I personally reviewed the patient's laboratory and imaging studies. Patient has an area of mid small bowel that is thickened but the oral contrast flows through it without any evidence of obstruction. There is no free air or perforation.  Patient probably has an episode of gastroenteritis versus GI upset due to the alcohol binging episode. There is no evidence of any free air or perforation at this point there is no acute surgical need. Would recommend admission to the medical team for IV fluid hydration given his elevated lactic acidosis and acute renal failure. Given that he has a normal white blood cell count would not recommend IV antibiotics but this is at the discretion of the primary team. Would also recommend counseling for alcohol cessation given that he's had multiple trips to the emergency room and now two admissions with similar symptoms.   Howie Ill, MD [facility]

## 2016-03-19 DIAGNOSIS — E872 Acidosis: Secondary | ICD-10-CM | POA: Diagnosis not present

## 2016-03-19 DIAGNOSIS — A0472 Enterocolitis due to Clostridium difficile, not specified as recurrent: Secondary | ICD-10-CM | POA: Diagnosis not present

## 2016-03-19 LAB — GASTROINTESTINAL PANEL BY PCR, STOOL (REPLACES STOOL CULTURE)

## 2016-03-19 LAB — COMPREHENSIVE METABOLIC PANEL
ALK PHOS: 70 U/L (ref 38–126)
ALT: 45 U/L (ref 17–63)
AST: 80 U/L — ABNORMAL HIGH (ref 15–41)
Albumin: 3.4 g/dL — ABNORMAL LOW (ref 3.5–5.0)
Anion gap: 8 (ref 5–15)
BILIRUBIN TOTAL: 0.9 mg/dL (ref 0.3–1.2)
BUN: 19 mg/dL (ref 6–20)
CALCIUM: 7.3 mg/dL — AB (ref 8.9–10.3)
CO2: 22 mmol/L (ref 22–32)
CREATININE: 1.32 mg/dL — AB (ref 0.61–1.24)
Chloride: 106 mmol/L (ref 101–111)
GFR, EST NON AFRICAN AMERICAN: 58 mL/min — AB (ref >60)
Glucose, Bld: 109 mg/dL — ABNORMAL HIGH (ref 65–99)
Potassium: 3.5 mmol/L (ref 3.5–5.1)
Sodium: 136 mmol/L (ref 135–145)
TOTAL PROTEIN: 6.5 g/dL (ref 6.5–8.1)

## 2016-03-19 LAB — CBC
HCT: 30.8 % — ABNORMAL LOW (ref 40.0–52.0)
Hemoglobin: 10.1 g/dL — ABNORMAL LOW (ref 13.0–18.0)
MCH: 29.6 pg (ref 26.0–34.0)
MCHC: 32.7 g/dL (ref 32.0–36.0)
MCV: 90.5 fL (ref 80.0–100.0)
PLATELETS: 126 10*3/uL — AB (ref 150–440)
RBC: 3.4 MIL/uL — AB (ref 4.40–5.90)
RDW: 15.3 % — ABNORMAL HIGH (ref 11.5–14.5)
WBC: 3.5 10*3/uL — AB (ref 3.8–10.6)

## 2016-03-19 LAB — GLUCOSE, CAPILLARY
GLUCOSE-CAPILLARY: 102 mg/dL — AB (ref 65–99)
GLUCOSE-CAPILLARY: 98 mg/dL (ref 65–99)
Glucose-Capillary: 106 mg/dL — ABNORMAL HIGH (ref 65–99)
Glucose-Capillary: 143 mg/dL — ABNORMAL HIGH (ref 65–99)

## 2016-03-19 LAB — CLOSTRIDIUM DIFFICILE BY PCR: Toxigenic C. Difficile by PCR: NEGATIVE

## 2016-03-19 LAB — URINE CULTURE: Culture: NO GROWTH

## 2016-03-19 LAB — C DIFFICILE QUICK SCREEN W PCR REFLEX
C DIFFICILE (CDIFF) TOXIN: NEGATIVE
C DIFFICLE (CDIFF) ANTIGEN: POSITIVE — AB

## 2016-03-19 MED ORDER — POTASSIUM CHLORIDE IN NACL 20-0.9 MEQ/L-% IV SOLN
INTRAVENOUS | Status: DC
  Filled 2016-03-19 (×2): qty 1000

## 2016-03-19 MED ORDER — POTASSIUM CHLORIDE 2 MEQ/ML IV SOLN
INTRAVENOUS | Status: DC
  Administered 2016-03-19: 09:00:00 via INTRAVENOUS
  Filled 2016-03-19 (×4): qty 1000

## 2016-03-19 MED ORDER — SODIUM CHLORIDE 0.9 % IV SOLN
INTRAVENOUS | Status: DC
  Administered 2016-03-19 – 2016-03-20 (×2): via INTRAVENOUS
  Filled 2016-03-19 (×4): qty 1000

## 2016-03-19 MED ORDER — VANCOMYCIN 50 MG/ML ORAL SOLUTION
250.0000 mg | Freq: Four times a day (QID) | ORAL | Status: DC
Start: 2016-03-19 — End: 2016-03-20
  Administered 2016-03-19 – 2016-03-20 (×4): 250 mg via ORAL
  Filled 2016-03-19 (×5): qty 5

## 2016-03-19 NOTE — Progress Notes (Signed)
Denies pain,having diarrhea and now cdiff positive,oral vancomycin started,enteric precautions in place.

## 2016-03-19 NOTE — Progress Notes (Signed)
[facility] - [place] at [facility]   PATIENT NAME: George Gomez    MR#:  [id number]  DATE OF BIRTH:  1960-08-01  SUBJECTIVE:  CHIEF COMPLAINT:   Chief Complaint  Patient presents with  . Abdominal Pain  Patient is a 56 year old African-American male with medical history significant with  alcohol abuse, diabetes, hyperlipidemia, hypertension, who presents to the hospital with complaints of nausea, vomiting, epigastric abdominal pain. No bleeding. CT scan of the emergency room revealed enteritis. Labs revealed acute renal failure, lactic acidosis, she was admitted. He feels a Some better today, less nausea, no vomiting, continues to have loose diarrheal stool. C. difficile testing came back positive. Initiated on vancomycin orally. Complains of mild right-sided abdominal pains, which improved  Review of Systems  Constitutional: Positive for malaise/fatigue. Negative for chills, fever and weight loss.  HENT: Negative for congestion.   Eyes: Negative for blurred vision and double vision.  Respiratory: Negative for cough, sputum production, shortness of breath and wheezing.   Cardiovascular: Negative for chest pain, palpitations, orthopnea, leg swelling and PND.  Gastrointestinal: Positive for abdominal pain and nausea. Negative for blood in stool, constipation, diarrhea and vomiting.  Genitourinary: Negative for dysuria, frequency, hematuria and urgency.  Musculoskeletal: Negative for falls.  Neurological: Negative for dizziness, tremors, focal weakness and headaches.  Endo/Heme/Allergies: Does not bruise/bleed easily.  Psychiatric/Behavioral: Negative for depression. The patient does not have insomnia.     VITAL SIGNS: Blood pressure 128/72, pulse 74, temperature 98.2 F (36.8 C), temperature source Oral, resp. rate 18, height 5\' 11"  (1.803 m), weight 104.3 kg (230 lb), SpO2 98 %.  PHYSICAL EXAMINATION:   GENERAL:  56 y.o.-year-old patient lying in the bed in  mild to moderate distress due to abdominal discomfort, nausea, intermittent burping, pale, uncomfortable.  EYES: Pupils equal, round, reactive to light and accommodation. No scleral icterus. Extraocular muscles intact.  HEENT: Head atraumatic, normocephalic. Oropharynx and nasopharynx clear.  NECK:  Supple, no jugular venous distention. No thyroid enlargement, no tenderness.  LUNGS: Normal breath sounds bilaterally, no wheezing, rales,rhonchi or crepitation. No use of accessory muscles of respiration.  CARDIOVASCULAR: S1, S2 normal. No murmurs, rubs, or gallops.  ABDOMEN: Soft, tender above umbilical area, right upper quadrant, but no rebound or guarding, nondistended. Bowel sounds present. No organomegaly or mass.  EXTREMITIES: No pedal edema, cyanosis, or clubbing.  NEUROLOGIC: Cranial nerves II through XII are intact. Muscle strength 5/5 in all extremities. Sensation intact. Gait not checked.  PSYCHIATRIC: The patient is alert and oriented x 3.  SKIN: No obvious rash, lesion, or ulcer.   ORDERS/RESULTS REVIEWED:   CBC  Recent Labs Lab 03/17/16 2201 03/18/16 0628 03/19/16 0422  WBC 7.5 6.2 3.5*  HGB 13.0 10.7* 10.1*  HCT 39.1* 32.3* 30.8*  PLT 250 184 126*  MCV 88.8 88.7 90.5  MCH 29.6 29.5 29.6  MCHC 33.4 33.3 32.7  RDW 15.3* 15.4* 15.3*   ------------------------------------------------------------------------------------------------------------------  Chemistries   Recent Labs Lab 03/17/16 2201 03/18/16 0628 03/18/16 1555 03/19/16 0422  NA 137 130* 132* 136  K 4.3 4.5 3.8 3.5  CL 97* 100* 101 106  CO2 12* 16* 21* 22  GLUCOSE 109* 135* 114* 109*  BUN 34* 35* 32* 19  CREATININE 2.78* 2.45* 1.80* 1.32*  CALCIUM 8.7* 7.5* 7.1* 7.3*  AST 68*  --   --  80*  ALT 52  --   --  45  ALKPHOS 94  --   --  70  BILITOT 1.5*  --   --  0.9   ------------------------------------------------------------------------------------------------------------------ estimated creatinine  clearance is 75 mL/min (by C-G formula based on SCr of 1.32 mg/dL (H)). ------------------------------------------------------------------------------------------------------------------ No results for input(s): TSH, T4TOTAL, T3FREE, THYROIDAB in the last 72 hours.  Invalid input(s): FREET3  Cardiac Enzymes  Recent Labs Lab 03/17/16 2201  TROPONINI <0.03   ------------------------------------------------------------------------------------------------------------------ Invalid input(s): POCBNP ---------------------------------------------------------------------------------------------------------------  RADIOLOGY: Ct Abdomen Pelvis Wo Contrast  Result Date: 03/18/2016 CLINICAL DATA:  Left lower quadrant abdominal pain EXAM: CT ABDOMEN AND PELVIS WITHOUT CONTRAST TECHNIQUE: Multidetector CT imaging of the abdomen and pelvis was performed following the standard protocol without IV contrast. COMPARISON:  08/01/2013 FINDINGS: Lower chest: Lung bases demonstrate mild linear atelectasis in the right middle lobe and right lung base. No acute consolidation or effusion. Coronary artery calcifications. 9 mm prevascular space lymph node. Normal heart size. Moderate gynecomastia. Hepatobiliary: Hepatic steatosis with heterogeneous fatty infiltration. No biliary dilatation. No calcified gallstones. Pancreas: Unremarkable. No pancreatic ductal dilatation or surrounding inflammatory changes. Spleen: Normal in size without focal abnormality. Adrenals/Urinary Tract: Adrenal glands are unremarkable. Kidneys are normal, without renal calculi, focal lesion, or hydronephrosis. Bladder is mildly thick-walled in appearance. Stomach/Bowel: The stomach is nonenlarged. There is large amount of contrast in the stomach. Thickened small bowel loops are present within the central abdomen and left lower quadrant. Edema and inflammation within the mesenteric fat surrounding the thickened bowel loops. No pneumatosis. Small bowel  loops do not appear dilated. Appendix is visualized and is normal. No colon wall thickening. Sigmoid colon diverticulosis without acute inflammation. Vascular/Lymphatic: Aortic atherosclerosis. No enlarged abdominal or pelvic lymph nodes. Reproductive: Prostate is unremarkable. Other: Small amount of ascites adjacent to the spleen and liver. No free air. Musculoskeletal: Degenerative changes of the spine. No acute or suspicious bone lesion. IMPRESSION: 1. Thickened inflamed loops of small bowel within the central abdomen and left lower quadrant. Findings could be secondary to infection, inflammation, or ischemia. 2. Sigmoid colon diverticulosis without acute diverticulitis. 3. Heterogenous fatty liver. 4. Small amount of ascites adjacent to the spleen Electronically Signed   By: Jasmine PangKim  Fujinaga M.D.   On: 03/18/2016 02:12   Dg Chest Portable 1 View  Result Date: 03/18/2016 CLINICAL DATA:  Left lower quadrant abdominal pain EXAM: PORTABLE CHEST 1 VIEW COMPARISON:  02/23/2015 FINDINGS: Stable mild elevation of right diaphragm with right basilar atelectasis. No consolidation or effusion. Stable mild cardiomegaly. No pneumothorax. IMPRESSION: Stable elevation of right diaphragm with atelectasis at the right base. No acute infiltrate. Electronically Signed   By: Jasmine PangKim  Fujinaga M.D.   On: 03/18/2016 00:45    EKG:  Orders placed or performed during the hospital encounter of 03/17/16  . EKG 12-Lead  . EKG 12-Lead    ASSESSMENT AND PLAN:  Active Problems:   Enteritis #1. Acute C. difficile gastroenteritis sheet patient on vancomycin orally, discontinue Zosyn, continue IV fluids, full liquid diet, advance diet as tolerated #2. Acute on chronic renal failure, improved to baseline with IV fluids #3. Lactic acidosis, improved #4. Anemia. With rehydration, get Hemoccult, hemoglobin level is relatively stable with hydration #5. Hyponatremia with dehydration, resolved.  #6. Alcohol abuse, watch for withdrawal,  initiate alcohol withdrawal scale as needed., Continue thiamine  Management plans discussed with the patient, family and they are in agreement.   DRUG ALLERGIES: No Known Allergies  CODE STATUS:     Code Status Orders        Start     Ordered   03/18/16 0428  Full code  Continuous     03/18/16 0427  Code Status History    Date Active Date Inactive Code Status Order ID Comments User Context   This patient has a current code status but no historical code status.      TOTAL TIME TAKING CARE OF THIS PATIENT: 35 minutes.    Katharina Caper M.D on 03/19/2016 at 1:20 PM  Between 7am to 6pm - Pager - [phone]  After 6pm go to [email or web address] - password [email or web address] [facility]  [facility]  Office  [phone]  CC: Primary care physician; [facility]

## 2016-03-20 DIAGNOSIS — E860 Dehydration: Secondary | ICD-10-CM

## 2016-03-20 DIAGNOSIS — E872 Acidosis, unspecified: Secondary | ICD-10-CM

## 2016-03-20 DIAGNOSIS — N179 Acute kidney failure, unspecified: Secondary | ICD-10-CM

## 2016-03-20 DIAGNOSIS — N189 Chronic kidney disease, unspecified: Secondary | ICD-10-CM

## 2016-03-20 DIAGNOSIS — A0472 Enterocolitis due to Clostridium difficile, not specified as recurrent: Secondary | ICD-10-CM | POA: Diagnosis not present

## 2016-03-20 DIAGNOSIS — F1010 Alcohol abuse, uncomplicated: Secondary | ICD-10-CM

## 2016-03-20 DIAGNOSIS — E871 Hypo-osmolality and hyponatremia: Secondary | ICD-10-CM

## 2016-03-20 HISTORY — DX: Chronic kidney disease, unspecified: N17.9

## 2016-03-20 HISTORY — DX: Acute kidney failure, unspecified: N18.9

## 2016-03-20 LAB — CBC
HEMATOCRIT: 31.9 % — AB (ref 40.0–52.0)
Hemoglobin: 10.5 g/dL — ABNORMAL LOW (ref 13.0–18.0)
MCH: 29.5 pg (ref 26.0–34.0)
MCHC: 33 g/dL (ref 32.0–36.0)
MCV: 89.4 fL (ref 80.0–100.0)
Platelets: 132 10*3/uL — ABNORMAL LOW (ref 150–440)
RBC: 3.56 MIL/uL — ABNORMAL LOW (ref 4.40–5.90)
RDW: 15.2 % — AB (ref 11.5–14.5)
WBC: 3 10*3/uL — ABNORMAL LOW (ref 3.8–10.6)

## 2016-03-20 LAB — CREATININE, SERUM
Creatinine, Ser: 1.02 mg/dL (ref 0.61–1.24)
GFR calc Af Amer: >60 mL/min (ref >60)
GFR calc non Af Amer: >60 mL/min (ref >60)

## 2016-03-20 LAB — GLUCOSE, CAPILLARY: Glucose-Capillary: 109 mg/dL — ABNORMAL HIGH (ref 65–99)

## 2016-03-20 MED ORDER — THIAMINE HCL 250 MG PO TABS: 250.0000 mg | ORAL_TABLET | Freq: Every day | ORAL | 0 refills | Status: DC

## 2016-03-20 MED ORDER — VANCOMYCIN 50 MG/ML ORAL SOLUTION: 250.0000 mg | Freq: Four times a day (QID) | ORAL | 0 refills | Status: DC

## 2016-03-20 MED ORDER — POLYVINYL ALCOHOL 1.4 % OP SOLN: 1.0000 [drp] | OPHTHALMIC | 0 refills | Status: DC | PRN

## 2016-03-20 NOTE — Discharge Summary (Signed)
[facility] - [place] at [facility]   PATIENT NAME: George Gomez    MR#:  [id number]  DATE OF BIRTH:  05/04/1960  DATE OF ADMISSION:  03/17/2016 ADMITTING PHYSICIAN: Ihor Austin, MD  DATE OF DISCHARGE: 03/20/2016 10:36 AM  PRIMARY CARE PHYSICIAN: [facility]     ADMISSION DIAGNOSIS:  Lactic acidosis [E87.2] Enteritis [K52.9] Acute renal insufficiency [N28.9]  DISCHARGE DIAGNOSIS:  Principal Problem:   Clostridium difficile diarrhea Active Problems:   Acute on chronic renal failure (HCC)   Dehydration   Lactic acidosis   Hyponatremia   Alcohol abuse   SECONDARY DIAGNOSIS:   Past Medical History:  Diagnosis Date  . Anemia   . Diabetes mellitus without complication (HCC)   . Hypercholesteremia   . Hypertension     .pro HOSPITAL COURSE:   Patient is a 56 year old African-American male with medical history significant with  alcohol abuse, diabetes, hyperlipidemia, hypertension, who presented to the hospital with complaints of nausea, vomiting, epigastric abdominal pain. No bleeding. CT scan of the emergency room revealed enteritis. Labs revealed acute renal failure, lactic acidosis, she was admitted. The patient was tested for C. difficile and it came back positive. He was initiated on vancomycin orally and his condition improved. He was felt to be stable to be discharged home today. Discussion by problem: #1. C. difficile gastroenterocolitis, continue vancomycin orally for 14 days,  advance diet as tolerated, patient was advised to use yogurts with lactobacilli, probiotics as outpatient #2. Acute on chronic renal failure, improved to baseline with IV fluids #3. Lactic acidosis, resolved #4. Anemia with rehydration,  hemoglobin level was stable with hydration, no active bleeding #5. Hyponatremia with dehydration, resolved.  #6. Alcohol abuse, no signs of withdrawal.  Continue thiamine. The patient was advised to quit drinking  alcohol DISCHARGE CONDITIONS:   Stable  CONSULTS OBTAINED:    DRUG ALLERGIES:  No Known Allergies  DISCHARGE MEDICATIONS:   Discharge Medication List as of 03/20/2016 10:14 AM    START taking these medications   Details  polyvinyl alcohol (LIQUIFILM TEARS) 1.4 % ophthalmic solution Place 1 drop into both eyes as needed for dry eyes., Starting Thu 03/20/2016, Normal    thiamine 250 MG tablet Take 1 tablet (250 mg total) by mouth daily., Starting Fri 03/21/2016, Normal    vancomycin (VANCOCIN) 50 mg/mL oral solution Take 5 mLs (250 mg total) by mouth every 6 (six) hours., Starting Thu 03/20/2016, Normal      CONTINUE these medications which have NOT CHANGED   Details  allopurinol (ZYLOPRIM) 300 MG tablet Take 1 tablet by mouth daily., Starting Thu 02/21/2016, Historical Med    atorvastatin (LIPITOR) 40 MG tablet Take 1 tablet by mouth daily., Starting Thu 02/21/2016, Historical Med    Cyanocobalamin (VITAMIN B-12 IJ) Inject 1 Dose as directed every 30 (thirty) days., Historical Med    gabapentin (NEURONTIN) 300 MG capsule Take 300 mg by mouth 2 (two) times daily., Starting Mon 02/18/2016, Historical Med    metFORMIN (GLUCOPHAGE-XR) 500 MG 24 hr tablet Take 2 tablets by mouth daily., Starting Wed 03/05/2016, Historical Med    vitamin B-12 (CYANOCOBALAMIN) 1000 MCG tablet Take 1 tablet by mouth as needed., Starting Mon 12/24/2015, Historical Med      STOP taking these medications     amLODipine (NORVASC) 5 MG tablet      lisinopril (PRINIVIL,ZESTRIL) 40 MG tablet          DISCHARGE INSTRUCTIONS:    The patient is to follow-up  with primary care physician within one week after discharge  If you experience worsening of your admission symptoms, develop shortness of breath, life threatening emergency, suicidal or homicidal thoughts you must seek medical attention immediately by calling 911 or calling your MD immediately  if symptoms less severe.  You Must read complete  instructions/literature along with all the possible adverse reactions/side effects for all the Medicines you take and that have been prescribed to you. Take any new Medicines after you have completely understood and accept all the possible adverse reactions/side effects.   Please note  You were cared for by a hospitalist during your hospital stay. If you have any questions about your discharge medications or the care you received while you were in the hospital after you are discharged, you can call the unit and asked to speak with the hospitalist on call if the hospitalist that took care of you is not available. Once you are discharged, your primary care physician will handle any further medical issues. Please note that NO REFILLS for any discharge medications will be authorized once you are discharged, as it is imperative that you return to your primary care physician (or establish a relationship with a primary care physician if you do not have one) for your aftercare needs so that they can reassess your need for medications and monitor your lab values.    Today   CHIEF COMPLAINT:   Chief Complaint  Patient presents with  . Abdominal Pain    HISTORY OF PRESENT ILLNESS:  George Gomez  is a 56 y.o. male with a known history of alcohol abuse, diabetes, hyperlipidemia, hypertension, who presented to the hospital with complaints of nausea, vomiting, epigastric abdominal pain. No bleeding. CT scan of the emergency room revealed enteritis. Labs revealed acute renal failure, lactic acidosis, she was admitted. The patient was tested for C. difficile and it came back positive. He was initiated on vancomycin orally and his condition improved. He was felt to be stable to be discharged home today. Discussion by problem: #1. C. difficile gastroenterocolitis, continue vancomycin orally for 14 days,  advance diet as tolerated, patient was advised to use yogurts with lactobacilli, probiotics as outpatient #2.  Acute on chronic renal failure, improved to baseline with IV fluids #3. Lactic acidosis, resolved #4. Anemia with rehydration,  hemoglobin level was stable with hydration, no active bleeding #5. Hyponatremia with dehydration, resolved.  #6. Alcohol abuse, no signs of withdrawal.  Continue thiamine. The patient was advised to quit drinking alcohol   VITAL SIGNS:  Blood pressure 137/79, pulse 77, temperature 98.1 F (36.7 C), temperature source Oral, resp. rate 18, height 5\' 11"  (1.803 m), weight 104.3 kg (230 lb), SpO2 100 %.  I/O:   Intake/Output Summary (Last 24 hours) at 03/20/16 1614 Last data filed at 03/20/16 [id number]  Gross per 24 hour  Intake          1103.75 ml  Output             2100 ml  Net          -996.25 ml    PHYSICAL EXAMINATION:  GENERAL:  56 y.o.-year-old patient lying in the bed with no acute distress.  EYES: Pupils equal, round, reactive to light and accommodation. No scleral icterus. Extraocular muscles intact.  HEENT: Head atraumatic, normocephalic. Oropharynx and nasopharynx clear.  NECK:  Supple, no jugular venous distention. No thyroid enlargement, no tenderness.  LUNGS: Normal breath sounds bilaterally, no wheezing, rales,rhonchi or crepitation. No use of accessory  muscles of respiration.  CARDIOVASCULAR: S1, S2 normal. No murmurs, rubs, or gallops.  ABDOMEN: Soft, non-tender, non-distended. Bowel sounds present. No organomegaly or mass.  EXTREMITIES: No pedal edema, cyanosis, or clubbing.  NEUROLOGIC: Cranial nerves II through XII are intact. Muscle strength 5/5 in all extremities. Sensation intact. Gait not checked.  PSYCHIATRIC: The patient is alert and oriented x 3.  SKIN: No obvious rash, lesion, or ulcer.   DATA REVIEW:   CBC  Recent Labs Lab 03/20/16 0438  WBC 3.0*  HGB 10.5*  HCT 31.9*  PLT 132*    Chemistries   Recent Labs Lab 03/19/16 0422 03/20/16 0438  NA 136  --   K 3.5  --   CL 106  --   CO2 22  --   GLUCOSE 109*  --   BUN  19  --   CREATININE 1.32* 1.02  CALCIUM 7.3*  --   AST 80*  --   ALT 45  --   ALKPHOS 70  --   BILITOT 0.9  --     Cardiac Enzymes  Recent Labs Lab 03/17/16 2201  TROPONINI <0.03    Microbiology Results  Results for orders placed or performed during the hospital encounter of 03/17/16  Urine culture     Status: None   Collection Time: 03/18/16 10:37 AM  Result Value Ref Range Status   Specimen Description URINE, CLEAN CATCH  Final   Special Requests NONE  Final   Culture   Final    NO GROWTH Performed at [facility], [address], [place], [place] [address]    Report Status 03/19/2016 FINAL  Final  Gastrointestinal Panel by PCR , Stool     Status: None   Collection Time: 03/19/16  8:57 AM  Result Value Ref Range Status   Campylobacter species NOT DETECTED NOT DETECTED Final   Plesimonas shigelloides NOT DETECTED NOT DETECTED Final   Salmonella species NOT DETECTED NOT DETECTED Final   Yersinia enterocolitica NOT DETECTED NOT DETECTED Final   Vibrio species NOT DETECTED NOT DETECTED Final   Vibrio cholerae NOT DETECTED NOT DETECTED Final   Enteroaggregative E coli (EAEC) NOT DETECTED NOT DETECTED Final   Enteropathogenic E coli (EPEC) NOT DETECTED NOT DETECTED Final   Enterotoxigenic E coli (ETEC) NOT DETECTED NOT DETECTED Final   Shiga like toxin producing E coli (STEC) NOT DETECTED NOT DETECTED Final   Shigella/Enteroinvasive E coli (EIEC) NOT DETECTED NOT DETECTED Final   Cryptosporidium NOT DETECTED NOT DETECTED Final   Cyclospora cayetanensis NOT DETECTED NOT DETECTED Final   Entamoeba histolytica NOT DETECTED NOT DETECTED Final   Giardia lamblia NOT DETECTED NOT DETECTED Final   Adenovirus F40/41 NOT DETECTED NOT DETECTED Final   Astrovirus NOT DETECTED NOT DETECTED Final   Norovirus GI/GII NOT DETECTED NOT DETECTED Final   Rotavirus A NOT DETECTED NOT DETECTED Final   Sapovirus (I, II, IV, and V) NOT DETECTED NOT DETECTED Final  C difficile quick scan  w PCR reflex     Status: Abnormal   Collection Time: 03/19/16  8:57 AM  Result Value Ref Range Status   C Diff antigen POSITIVE (A) NEGATIVE Final   C Diff toxin NEGATIVE NEGATIVE Final   C Diff interpretation Results are indeterminate. See PCR results.  Final  Clostridium Difficile by PCR     Status: None   Collection Time: 03/19/16  8:57 AM  Result Value Ref Range Status   Toxigenic C Difficile by pcr NEGATIVE NEGATIVE Final  Comment: Patient is colonized with non toxigenic C. difficile. May not need treatment unless significant symptoms are present.    RADIOLOGY:  No results found.  EKG:   Orders placed or performed during the hospital encounter of 03/17/16  . EKG 12-Lead  . EKG 12-Lead      Management plans discussed with the patient, family and they are in agreement.  CODE STATUS:  Code Status History    Date Active Date Inactive Code Status Order ID Comments User Context   03/18/2016  4:27 AM 03/20/2016  1:53 PM Full Code [id number]  Ihor Austin, MD Inpatient      TOTAL TIME TAKING CARE OF THIS PATIENT: 40 minutes.    Katharina Caper M.D on 03/20/2016 at 4:14 PM  Between 7am to 6pm - Pager - [phone]  After 6pm go to [email or web address] - password [email or web address] [facility]  [place] [facility]  Office  [phone]  CC: Primary care physician; [facility]

## 2016-03-20 NOTE — Plan of Care (Signed)
Problem: Bowel/Gastric: Goal: Will not experience complications related to bowel motility Outcome: Progressing Minimal complaints of abdominal pain. Cdiff positive. Enteric precautions continued. Pt on oral Vancomycin.

## 2016-03-20 NOTE — Plan of Care (Signed)
MD making rounds. Received order to discharge home. IV removed. Prescriptions given to patient. Education handouts provided to patient. No unanswered questions. Discharged vi wheelchair by Scientist, research (physical sciences)auxiliary staff. Belongings sent with patient and family.

## 2016-03-20 NOTE — Care Management (Signed)
Instructed to go to [facility] for Vancomycin Solution.  Gwenette GreetBrenda S Murel Shenberger RN MSN CCM Care Management

## 2016-08-28 ENCOUNTER — Emergency Department: Payer: Medicare Other

## 2016-08-28 ENCOUNTER — Inpatient Hospital Stay
Admission: EM | Admit: 2016-08-28 | Discharge: 2016-08-29 | DRG: 683 | Disposition: A | Discharge status: Home / Self Care | Payer: Medicare Other | Attending: Internal Medicine | Admitting: Internal Medicine

## 2016-08-28 ENCOUNTER — Encounter: Payer: Self-pay | Admitting: Emergency Medicine

## 2016-08-28 DIAGNOSIS — A040 Enteropathogenic Escherichia coli infection: Secondary | ICD-10-CM | POA: Diagnosis present

## 2016-08-28 DIAGNOSIS — N170 Acute kidney failure with tubular necrosis: Principal | ICD-10-CM | POA: Diagnosis present

## 2016-08-28 DIAGNOSIS — E1140 Type 2 diabetes mellitus with diabetic neuropathy, unspecified: Secondary | ICD-10-CM | POA: Diagnosis present

## 2016-08-28 DIAGNOSIS — E8729 Other acidosis: Secondary | ICD-10-CM

## 2016-08-28 DIAGNOSIS — E785 Hyperlipidemia, unspecified: Secondary | ICD-10-CM | POA: Diagnosis present

## 2016-08-28 DIAGNOSIS — Z8249 Family history of ischemic heart disease and other diseases of the circulatory system: Secondary | ICD-10-CM | POA: Diagnosis not present

## 2016-08-28 DIAGNOSIS — E860 Dehydration: Secondary | ICD-10-CM | POA: Diagnosis not present

## 2016-08-28 DIAGNOSIS — N183 Chronic kidney disease, stage 3 (moderate): Secondary | ICD-10-CM | POA: Diagnosis not present

## 2016-08-28 DIAGNOSIS — N179 Acute kidney failure, unspecified: Secondary | ICD-10-CM

## 2016-08-28 DIAGNOSIS — E872 Acidosis: Secondary | ICD-10-CM | POA: Diagnosis present

## 2016-08-28 DIAGNOSIS — E1122 Type 2 diabetes mellitus with diabetic chronic kidney disease: Secondary | ICD-10-CM | POA: Diagnosis not present

## 2016-08-28 DIAGNOSIS — Z8619 Personal history of other infectious and parasitic diseases: Secondary | ICD-10-CM | POA: Diagnosis not present

## 2016-08-28 DIAGNOSIS — Z833 Family history of diabetes mellitus: Secondary | ICD-10-CM | POA: Diagnosis not present

## 2016-08-28 DIAGNOSIS — K529 Noninfective gastroenteritis and colitis, unspecified: Secondary | ICD-10-CM

## 2016-08-28 DIAGNOSIS — R197 Diarrhea, unspecified: Secondary | ICD-10-CM

## 2016-08-28 DIAGNOSIS — I129 Hypertensive chronic kidney disease with stage 1 through stage 4 chronic kidney disease, or unspecified chronic kidney disease: Secondary | ICD-10-CM | POA: Diagnosis not present

## 2016-08-28 HISTORY — DX: Diverticulitis of intestine, part unspecified, without perforation or abscess without bleeding: K57.92

## 2016-08-28 HISTORY — DX: Acute kidney failure, unspecified: N17.9

## 2016-08-28 LAB — COMPREHENSIVE METABOLIC PANEL
ALT: 39 U/L (ref 17–63)
AST: 54 U/L — ABNORMAL HIGH (ref 15–41)
Albumin: 4.6 g/dL (ref 3.5–5.0)
Alkaline Phosphatase: 81 U/L (ref 38–126)
Anion gap: 23 — ABNORMAL HIGH (ref 5–15)
BUN: 39 mg/dL — ABNORMAL HIGH (ref 6–20)
CHLORIDE: 95 mmol/L — AB (ref 101–111)
CO2: 14 mmol/L — ABNORMAL LOW (ref 22–32)
CREATININE: 2.2 mg/dL — AB (ref 0.61–1.24)
Calcium: 8.9 mg/dL (ref 8.9–10.3)
GFR, EST AFRICAN AMERICAN: 36 mL/min — AB (ref >60)
GFR, EST NON AFRICAN AMERICAN: 31 mL/min — AB (ref >60)
Glucose, Bld: 154 mg/dL — ABNORMAL HIGH (ref 65–99)
POTASSIUM: 4.9 mmol/L (ref 3.5–5.1)
Sodium: 132 mmol/L — ABNORMAL LOW (ref 135–145)
Total Bilirubin: 2.6 mg/dL — ABNORMAL HIGH (ref 0.3–1.2)
Total Protein: 8.3 g/dL — ABNORMAL HIGH (ref 6.5–8.1)

## 2016-08-28 LAB — CBC WITH DIFFERENTIAL/PLATELET
Basophils Absolute: 0.1 10*3/uL (ref 0–0.1)
Basophils Relative: 1 %
EOS PCT: 0 %
Eosinophils Absolute: 0 10*3/uL (ref 0–0.7)
HCT: 45.2 % (ref 40.0–52.0)
Hemoglobin: 14.9 g/dL (ref 13.0–18.0)
LYMPHS ABS: 1.1 10*3/uL (ref 1.0–3.6)
Lymphocytes Relative: 14 %
MCH: 29.7 pg (ref 26.0–34.0)
MCHC: 33 g/dL (ref 32.0–36.0)
MCV: 90 fL (ref 80.0–100.0)
MONO ABS: 0.8 10*3/uL (ref 0.2–1.0)
Monocytes Relative: 9 %
Neutro Abs: 6.3 10*3/uL (ref 1.4–6.5)
Neutrophils Relative %: 76 %
PLATELETS: 218 10*3/uL (ref 150–440)
RBC: 5.02 MIL/uL (ref 4.40–5.90)
RDW: 17.4 % — AB (ref 11.5–14.5)
WBC: 8.3 10*3/uL (ref 3.8–10.6)

## 2016-08-28 LAB — ETHANOL: Alcohol, Ethyl (B): <5 mg/dL (ref <5)

## 2016-08-28 LAB — GLUCOSE, CAPILLARY
GLUCOSE-CAPILLARY: 150 mg/dL — AB (ref 65–99)
Glucose-Capillary: 126 mg/dL — ABNORMAL HIGH (ref 65–99)

## 2016-08-28 LAB — GASTROINTESTINAL PANEL BY PCR, STOOL (REPLACES STOOL CULTURE)
ADENOVIRUS F40/41: NOT DETECTED
Astrovirus: NOT DETECTED
CRYPTOSPORIDIUM: NOT DETECTED
CYCLOSPORA CAYETANENSIS: NOT DETECTED
Campylobacter species: NOT DETECTED
ENTEROAGGREGATIVE E COLI (EAEC): NOT DETECTED
ENTEROPATHOGENIC E COLI (EPEC): DETECTED — AB
Entamoeba histolytica: NOT DETECTED
Enterotoxigenic E coli (ETEC): NOT DETECTED
GIARDIA LAMBLIA: NOT DETECTED
Norovirus GI/GII: NOT DETECTED
PLESIMONAS SHIGELLOIDES: NOT DETECTED
ROTAVIRUS A: NOT DETECTED
SALMONELLA SPECIES: NOT DETECTED
SHIGELLA/ENTEROINVASIVE E COLI (EIEC): NOT DETECTED
Sapovirus (I, II, IV, and V): NOT DETECTED
Shiga like toxin producing E coli (STEC): NOT DETECTED
VIBRIO SPECIES: NOT DETECTED
Vibrio cholerae: NOT DETECTED
YERSINIA ENTEROCOLITICA: NOT DETECTED

## 2016-08-28 LAB — TROPONIN I
TROPONIN I: 0.03 ng/mL — AB (ref <0.03)
Troponin I: <0.03 ng/mL (ref <0.03)
Troponin I: <0.03 ng/mL (ref <0.03)
Troponin I: <0.03 ng/mL (ref <0.03)

## 2016-08-28 LAB — LIPASE, BLOOD: LIPASE: 30 U/L (ref 11–51)

## 2016-08-28 LAB — C DIFFICILE QUICK SCREEN W PCR REFLEX
C DIFFICILE (CDIFF) TOXIN: NEGATIVE
C Diff antigen: POSITIVE — AB

## 2016-08-28 LAB — CLOSTRIDIUM DIFFICILE BY PCR: Toxigenic C. Difficile by PCR: NEGATIVE

## 2016-08-28 MED ORDER — SODIUM CHLORIDE 0.9 % IV BOLUS (SEPSIS)
1000.0000 mL | Freq: Once | INTRAVENOUS | Status: AC
  Administered 2016-08-28: 1000 mL via INTRAVENOUS

## 2016-08-28 MED ORDER — HEPARIN SODIUM (PORCINE) 5000 UNIT/ML IJ SOLN
5000.0000 [IU] | Freq: Three times a day (TID) | INTRAMUSCULAR | Status: DC
  Administered 2016-08-28 – 2016-08-29 (×3): 5000 [IU] via SUBCUTANEOUS
  Filled 2016-08-28 (×3): qty 1

## 2016-08-28 MED ORDER — FOLIC ACID 1 MG PO TABS
1.0000 mg | ORAL_TABLET | Freq: Every day | ORAL | Status: DC
  Administered 2016-08-28 – 2016-08-29 (×2): 1 mg via ORAL
  Filled 2016-08-28 (×2): qty 1

## 2016-08-28 MED ORDER — GABAPENTIN 300 MG PO CAPS
300.0000 mg | ORAL_CAPSULE | Freq: Two times a day (BID) | ORAL | Status: DC
  Administered 2016-08-28 – 2016-08-29 (×3): 300 mg via ORAL
  Filled 2016-08-28 (×3): qty 1

## 2016-08-28 MED ORDER — LORAZEPAM 1 MG PO TABS: 1.0000 mg | ORAL_TABLET | Freq: Four times a day (QID) | ORAL | Status: DC | PRN

## 2016-08-28 MED ORDER — INSULIN ASPART 100 UNIT/ML ~~LOC~~ SOLN: 0.0000 [IU] | Freq: Every day | SUBCUTANEOUS | Status: DC

## 2016-08-28 MED ORDER — ACETAMINOPHEN 325 MG PO TABS
650.0000 mg | ORAL_TABLET | Freq: Four times a day (QID) | ORAL | Status: DC | PRN
  Administered 2016-08-28: 650 mg via ORAL
  Filled 2016-08-28: qty 2

## 2016-08-28 MED ORDER — ONDANSETRON HCL 4 MG/2ML IJ SOLN: 4.0000 mg | Freq: Four times a day (QID) | INTRAMUSCULAR | Status: DC | PRN

## 2016-08-28 MED ORDER — ONDANSETRON HCL 4 MG PO TABS: 4.0000 mg | ORAL_TABLET | Freq: Four times a day (QID) | ORAL | Status: DC | PRN

## 2016-08-28 MED ORDER — THIAMINE HCL 100 MG/ML IJ SOLN: 100.0000 mg | Freq: Every day | INTRAMUSCULAR | Status: DC

## 2016-08-28 MED ORDER — ATORVASTATIN CALCIUM 20 MG PO TABS
40.0000 mg | ORAL_TABLET | Freq: Every day | ORAL | Status: DC
  Administered 2016-08-28 – 2016-08-29 (×2): 40 mg via ORAL
  Filled 2016-08-28 (×2): qty 2

## 2016-08-28 MED ORDER — ONDANSETRON HCL 4 MG/2ML IJ SOLN
4.0000 mg | Freq: Once | INTRAMUSCULAR | Status: AC
  Administered 2016-08-28: 4 mg via INTRAVENOUS
  Filled 2016-08-28: qty 2

## 2016-08-28 MED ORDER — MORPHINE SULFATE (PF) 4 MG/ML IV SOLN
4.0000 mg | Freq: Once | INTRAVENOUS | Status: AC
  Administered 2016-08-28: 4 mg via INTRAVENOUS
  Filled 2016-08-28: qty 1

## 2016-08-28 MED ORDER — ADULT MULTIVITAMIN W/MINERALS CH
1.0000 | ORAL_TABLET | Freq: Every day | ORAL | Status: DC
  Administered 2016-08-28 – 2016-08-29 (×2): 1 via ORAL
  Filled 2016-08-28 (×3): qty 1

## 2016-08-28 MED ORDER — ALLOPURINOL 100 MG PO TABS
300.0000 mg | ORAL_TABLET | Freq: Every day | ORAL | Status: DC
  Administered 2016-08-28 – 2016-08-29 (×2): 300 mg via ORAL
  Filled 2016-08-28 (×2): qty 3

## 2016-08-28 MED ORDER — INSULIN ASPART 100 UNIT/ML ~~LOC~~ SOLN
0.0000 [IU] | Freq: Three times a day (TID) | SUBCUTANEOUS | Status: DC
  Administered 2016-08-28: 1 [IU] via SUBCUTANEOUS
  Filled 2016-08-28: qty 1

## 2016-08-28 MED ORDER — SODIUM CHLORIDE 0.9 % IV SOLN
INTRAVENOUS | Status: DC
  Administered 2016-08-28 – 2016-08-29 (×3): via INTRAVENOUS

## 2016-08-28 MED ORDER — KETOROLAC TROMETHAMINE 30 MG/ML IJ SOLN: 15.0000 mg | Freq: Once | INTRAMUSCULAR | Status: DC

## 2016-08-28 MED ORDER — ACETAMINOPHEN 650 MG RE SUPP: 650.0000 mg | Freq: Four times a day (QID) | RECTAL | Status: DC | PRN

## 2016-08-28 MED ORDER — DEXTROSE 5 % IV SOLN
2.0000 g | INTRAVENOUS | Status: DC
  Administered 2016-08-28: 2 g via INTRAVENOUS
  Filled 2016-08-28 (×2): qty 2

## 2016-08-28 MED ORDER — VITAMIN B-1 100 MG PO TABS
100.0000 mg | ORAL_TABLET | Freq: Every day | ORAL | Status: DC
  Administered 2016-08-28 – 2016-08-29 (×2): 100 mg via ORAL
  Filled 2016-08-28 (×2): qty 1

## 2016-08-28 MED ORDER — LORAZEPAM 2 MG/ML IJ SOLN: 1.0000 mg | Freq: Four times a day (QID) | INTRAMUSCULAR | Status: DC | PRN

## 2016-08-28 NOTE — ED Notes (Signed)
Pt has had no diarrhea or BM since arrival to ED

## 2016-08-28 NOTE — H&P (Signed)
[facility] - [place] at [facility]   PATIENT NAME: George Gomez    MR#:  [id number]  DATE OF BIRTH:  04/08/1960  DATE OF ADMISSION:  08/28/2016  PRIMARY CARE PHYSICIAN: [facility]   REQUESTING/REFERRING PHYSICIAN: Dr. Nita Sicklearolina Veronese  CHIEF COMPLAINT:   Chief Complaint  Patient presents with  . Abdominal Pain    HISTORY OF PRESENT ILLNESS:  George ArthurMichael Kuehnle  is a 56 y.o. male with a known history of Diabetes type 2 without complication, hypertension, hyperlipidemia, chronic anemia, alcohol abuse, admission for C. difficile colitis a few months back who presents to the hospital due to abdominal pain and diarrhea. Patient says he's been having diarrhea and on and off for the past month or 2 months. Area is nonbloody and sometimes green in color. He denies any fevers but admits to some chills, also admits to intermittent nausea vomiting associated with diarrhea. He presents to the hospital was noted to be in acute kidney injury and due to his ongoing diarrhea and previous history of C. difficile colitis hospitalist services were contacted further treatment and evaluation. Patient denies any chest pains, shortness of breath, fevers, dysuria, hematuria, numbness tingling or any other associated symptoms presently.  PAST MEDICAL HISTORY:   Past Medical History:  Diagnosis Date  . Anemia   . Diabetes mellitus without complication (HCC)   . Diverticulitis    Pt states diverticulitis  . Hypercholesteremia   . Hypertension     PAST SURGICAL HISTORY:   Past Surgical History:  Procedure Laterality Date  . none      SOCIAL HISTORY:   Social History  Substance Use Topics  . Smoking status: Never Smoker  . Smokeless tobacco: Never Used  . Alcohol use Yes     Comment: 2-3 drinks/day    FAMILY HISTORY:   Family History  Problem Relation Age of Onset  . Rheum arthritis Mother   . Diabetes Father   . Heart disease Father     DRUG ALLERGIES:   No Known Allergies  REVIEW OF SYSTEMS:   Review of Systems  Constitutional: Negative for fever and weight loss.  HENT: Negative for congestion, nosebleeds and tinnitus.   Eyes: Negative for blurred vision, double vision and redness.  Respiratory: Negative for cough, hemoptysis and shortness of breath.   Cardiovascular: Negative for chest pain, orthopnea, leg swelling and PND.  Gastrointestinal: Positive for diarrhea, nausea and vomiting. Negative for abdominal pain and melena.  Genitourinary: Negative for dysuria, hematuria and urgency.  Musculoskeletal: Negative for falls and joint pain.  Neurological: Negative for dizziness, tingling, sensory change, focal weakness, seizures, weakness and headaches.  Endo/Heme/Allergies: Negative for polydipsia. Does not bruise/bleed easily.  Psychiatric/Behavioral: Negative for depression and memory loss. The patient is not nervous/anxious.     MEDICATIONS AT HOME:   Prior to Admission medications   Medication Sig Start Date End Date Taking? Authorizing Provider  allopurinol (ZYLOPRIM) 300 MG tablet Take 1 tablet by mouth daily. 02/21/16  Yes [provider]  atorvastatin (LIPITOR) 40 MG tablet Take 1 tablet by mouth daily. 02/21/16  Yes [provider]  Cyanocobalamin (VITAMIN B-12 IJ) Inject 1 Dose as directed every 30 (thirty) days.   Yes [provider]  gabapentin (NEURONTIN) 300 MG capsule Take 300 mg by mouth 2 (two) times daily. 02/18/16  Yes [provider]  lisinopril (PRINIVIL,ZESTRIL) 10 MG tablet Take 10 mg by mouth daily. 08/05/16  Yes [provider]  metFORMIN (GLUCOPHAGE-XR) 500 MG 24 hr  tablet Take 2 tablets by mouth daily. 03/05/16  Yes [provider]  polyvinyl alcohol (LIQUIFILM TEARS) 1.4 % ophthalmic solution Place 1 drop into both eyes as needed for dry eyes. 03/20/16  Yes Katharina Caper, MD  vitamin B-12 (CYANOCOBALAMIN) 1000 MCG tablet Take 1 tablet by mouth as needed.  12/24/15  Yes [provider]  thiamine 250 MG tablet Take 1 tablet (250 mg total) by mouth daily. Patient not taking: Reported on 08/28/2016 03/21/16   Katharina Caper, MD  vancomycin (VANCOCIN) 50 mg/mL oral solution Take 5 mLs (250 mg total) by mouth every 6 (six) hours. Patient not taking: Reported on 08/28/2016 03/20/16   Katharina Caper, MD      VITAL SIGNS:  Blood pressure 133/77, pulse 99, temperature 98.7 F (37.1 C), temperature source Oral, resp. rate (!) 33, height 5\' 11"  (1.803 m), weight 98 kg (216 lb), SpO2 90 %.  PHYSICAL EXAMINATION:  Physical Exam  GENERAL:  56 y.o.-year-old obese patient lying in the bed in no acute distress.  EYES: Pupils equal, round, reactive to light and accommodation. No scleral icterus. Extraocular muscles intact.  HEENT: Head atraumatic, normocephalic. Oropharynx and nasopharynx clear. No oropharyngeal erythema, moist oral mucosa  NECK:  Supple, no jugular venous distention. No thyroid enlargement, no tenderness.  LUNGS: Normal breath sounds bilaterally, no wheezing, rales, rhonchi. No use of accessory muscles of respiration.  CARDIOVASCULAR: S1, S2 RRR. No murmurs, rubs, gallops, clicks.  ABDOMEN: Soft, nontender, nondistended. Bowel sounds present. No organomegaly or mass.  EXTREMITIES: No pedal edema, cyanosis, or clubbing. + 2 pedal & radial pulses b/l.   NEUROLOGIC: Cranial nerves II through XII are intact. No focal Motor or sensory deficits appreciated b/l PSYCHIATRIC: The patient is alert and oriented x 3.  SKIN: No obvious rash, lesion, or ulcer.   LABORATORY PANEL:   CBC  Recent Labs Lab 08/28/16 0634  WBC 8.3  HGB 14.9  HCT 45.2  PLT 218   ------------------------------------------------------------------------------------------------------------------  Chemistries   Recent Labs Lab 08/28/16 0634  NA 132*  K 4.9  CL 95*  CO2 14*  GLUCOSE 154*  BUN 39*  CREATININE 2.20*  CALCIUM 8.9  AST 54*  ALT 39  ALKPHOS  81  BILITOT 2.6*   ------------------------------------------------------------------------------------------------------------------  Cardiac Enzymes  Recent Labs Lab 08/28/16 0704  TROPONINI 0.03*   ------------------------------------------------------------------------------------------------------------------  RADIOLOGY:  Dg Chest Portable 1 View  Result Date: 08/28/2016 CLINICAL DATA:  Chest pain and shortness of breath. EXAM: PORTABLE CHEST 1 VIEW COMPARISON:  Chest x-ray dated December 16, 2016. FINDINGS: The cardiomediastinal silhouette is normal in size. Normal pulmonary vascularity. Unchanged elevated right hemidiaphragm with associated basilar atelectasis. The lungs are otherwise clear. No focal consolidation, pleural effusion, or pneumothorax. No acute osseous abnormality. IMPRESSION: No active cardiopulmonary disease. Electronically Signed   By: Obie Dredge M.D.   On: 08/28/2016 07:22   Ct Renal Stone Study  Result Date: 08/28/2016 CLINICAL DATA:  Abdominal pain. EXAM: CT ABDOMEN AND PELVIS WITHOUT CONTRAST TECHNIQUE: Multidetector CT imaging of the abdomen and pelvis was performed following the standard protocol without IV contrast. COMPARISON:  CT abdomen pelvis dated January 15, 2017. FINDINGS: Lower chest: Atelectasis/ scarring in the right middle and lower lobe is unchanged. Interval increase in size of previously seen prevascular space lymph node, now measuring 13 mm, previously 9 mm. Normal heart size. No pericardial effusion. Coronary artery calcifications. Bilateral gynecomastia. Hepatobiliary: Diffuse hepatic steatosis. No gallstones, gallbladder wall thickening, or biliary dilatation. Pancreas: Unremarkable. No pancreatic ductal dilatation or  surrounding inflammatory changes. Spleen: Normal in size without focal abnormality. Adrenals/Urinary Tract: Adrenal glands are unremarkable. Kidneys are normal, without renal calculi, focal lesion, or hydronephrosis. Bladder is  underdistended. Stomach/Bowel: Thickened small bowel loops are again seen within the central abdomen and bilateral lower quadrants. There is associated edema and fat stranding within the mesentery adjacent to the thickened bowel loops. No small bowel dilatation. The stomach is unremarkable. Colonic diverticulosis without evidence of diverticulitis. The appendix is normal. Vascular/Lymphatic: Aortic atherosclerosis. No enlarged abdominal or pelvic lymph nodes. Reproductive: Prostate is unremarkable. Other: Trace free fluid in the pelvis. No pneumoperitoneum. Small right fat containing inguinal hernia. Musculoskeletal: No acute or significant osseous findings. Degenerative changes of the spine. IMPRESSION: 1. Again seen are thickened and inflamed loops of small bowel within the central abdomen and lower quadrants. Findings are nonspecific, and can be seen in the setting of infection, inflammation, or ischemia. No evidence of pneumoperitoneum. 2. Hepatic steatosis. 3. Partially visualized enlarging prevascular space lymph node, now measuring 13 mm, previously 9 mm. Chest CT is recommended for further evaluation. 4.  Aortic Atherosclerosis (ICD10-I70.0). Electronically Signed   By: Obie DredgeWilliam T Derry M.D.   On: 08/28/2016 08:04     IMPRESSION AND PLAN:   56 year old male with past medical history of alcohol abuse, diabetes, hypertension, previous history of C. difficile colitis who presents to the hospital due to abdominal pain and diarrhea.  1. Abdominal pain/diarrhea-allergy unclear presently. Patient has a previous history of C. difficile colitis. Patient CT scan abdomen pelvis is showing findings consistent with enteritis given thickened and inflamed small bowel loops. -Supportive care with IV fluids, antibiotics, clear liquid diet. -Await C. difficile PCR and GI panel. -Continue supportive care for now.  2. Acute kidney injury-secondary to the dehydration from ongoing diarrhea. -We'll hydrate the patient  with IV fluids, follow BUN/creatinine urine output. -Hold ACE inhibitor, metformin.  3. History of gout-no acute attack-continue allopurinol.  4. Hyperlipidemia-continue atorvastatin.  5. Diabetic neuropathy-continue gabapentin.  6. History of alcohol abuse-patient is high risk for withdrawal. -Place the patient on CIWA protocol.  7. Diabetes type 2 without complication-hold metformin. Place patient on sliding scale insulin.    All the records are reviewed and case discussed with ED provider. Management plans discussed with the patient, family and they are in agreement.  CODE STATUS: Full  TOTAL TIME TAKING CARE OF THIS PATIENT: 45 minutes.    [place] SirenSAINANI,Ashanti Ratti J M.D on 08/28/2016 at 11:58 AM  Between 7am to 6pm - Pager - [phone]  After 6pm go to [email or web address] - password [email or web address] [email or web address]  [facility]  Office  [phone]  CC: Primary care physician; [facility]

## 2016-08-28 NOTE — ED Notes (Signed)
Dr. Don PerkingVeronese was notified of pt's troponin level of 0.03

## 2016-08-28 NOTE — ED Notes (Signed)
A hat was placed in the commode for patient's use. Patient states he had a small loose stool, but none made it into the hat. Patient was again given directions for use of the hat.

## 2016-08-28 NOTE — ED Notes (Signed)
Nurse introduced herself to pt and explained that she would be his nurse. Pt stating that he was in pain all night and unable to sleep. Pt stating he was going to come in earlier but had no ride. Pt stating that he has been told he has diverticulitis in the past.

## 2016-08-28 NOTE — ED Triage Notes (Addendum)
Patient ambulatory to triage with steady gait, without difficulty or distress noted; pt reports left lower abd pain with no accomp symptoms; seen for same few months ago and was dx with "infection"

## 2016-08-28 NOTE — ED Notes (Signed)
Pt was given cup of water. Pt stating that his nausea has resolved. Pt in NAD and VS have improved after fluids.

## 2016-08-28 NOTE — ED Notes (Signed)
X-ray at bedside

## 2016-08-28 NOTE — ED Notes (Signed)
Lights turned low. Nurse explained that pt had to wait for CT results before having water.

## 2016-08-28 NOTE — ED Notes (Addendum)
Pt reports hx of diverticulitis and states in Feb this year he was admitted for "infection in my stomach", pt unable to state what the infection was. Pt states the pain today is similar to the pain in Feb.

## 2016-08-28 NOTE — Progress Notes (Signed)
CRITICAL VALUE ALERT  Critical Value:  GI Panel Enteropathogenic ecoli   Date & Time Notied:  08/28/16 1825  Provider Notified: Dr. Nonie HoyerSaniani  Orders Received/Actions taken: orders received and entered

## 2016-08-28 NOTE — ED Provider Notes (Signed)
[facility] Emergency Department Provider Note  ____________________________________________  Time seen: Approximately 7:35 AM  I have reviewed the triage vital signs and the nursing notes.   HISTORY  Chief Complaint Abdominal Pain   HPI Syair Fricker is a 56 y.o. male history of alcohol abuse, diverticulitis, hypertension, hyperlipidemia, diabetes who presents for evaluation of abdominal pain.Patient reports abdominal pain that started yesterday evening and has been constant, 7 out of 10, diffuse in his abdomen, sharp, associated with several episodes of nonbloody nonbilious emesis and watery diarrhea. No melena, no hematemesis, no hematochezia. Patient has had chills but no fever. Patient denies any prior abdominal surgeries. No chest pain or shortness of breath. No dysuria or hematuria. Patient has had a prior history of diverticulitis. Patient has also had C. difficile in the past.  Past Medical History:  Diagnosis Date  . Anemia   . Diabetes mellitus without complication (HCC)   . Diverticulitis    Pt states diverticulitis  . Hypercholesteremia   . Hypertension     Patient Active Problem List   Diagnosis Date Noted  . Acute renal failure (ARF) (HCC) 08/28/2016  . Acute on chronic renal failure (HCC) 03/20/2016  . Dehydration 03/20/2016  . Lactic acidosis 03/20/2016  . Hyponatremia 03/20/2016  . Alcohol abuse 03/20/2016  . Clostridium difficile diarrhea 03/18/2016    Past Surgical History:  Procedure Laterality Date  . none      Prior to Admission medications   Medication Sig Start Date End Date Taking? Authorizing Provider  allopurinol (ZYLOPRIM) 300 MG tablet Take 1 tablet by mouth daily. 02/21/16  Yes [provider]  atorvastatin (LIPITOR) 40 MG tablet Take 1 tablet by mouth daily. 02/21/16  Yes [provider]  Cyanocobalamin (VITAMIN B-12 IJ) Inject 1 Dose as directed every 30 (thirty) days.   Yes [provider]  gabapentin (NEURONTIN) 300 MG capsule Take 300 mg by mouth 2 (two) times daily. 02/18/16  Yes [provider]  lisinopril (PRINIVIL,ZESTRIL) 10 MG tablet Take 10 mg by mouth daily. 08/05/16  Yes [provider]  metFORMIN (GLUCOPHAGE-XR) 500 MG 24 hr tablet Take 2 tablets by mouth daily. 03/05/16  Yes [provider]  polyvinyl alcohol (LIQUIFILM TEARS) 1.4 % ophthalmic solution Place 1 drop into both eyes as needed for dry eyes. 03/20/16  Yes Katharina Caper, MD  vitamin B-12 (CYANOCOBALAMIN) 1000 MCG tablet Take 1 tablet by mouth as needed. 12/24/15  Yes [provider]  thiamine 250 MG tablet Take 1 tablet (250 mg total) by mouth daily. Patient not taking: Reported on 08/28/2016 03/21/16   Katharina Caper, MD  vancomycin (VANCOCIN) 50 mg/mL oral solution Take 5 mLs (250 mg total) by mouth every 6 (six) hours. Patient not taking: Reported on 08/28/2016 03/20/16   Katharina Caper, MD    Allergies Patient has no known allergies.  Family History  Problem Relation Age of Onset  . Rheum arthritis Mother   . Diabetes Father   . Heart disease Father     Social History Social History  Substance Use Topics  . Smoking status: Never Smoker  . Smokeless tobacco: Never Used  . Alcohol use Yes     Comment: 2-3 drinks/day    Review of Systems  Constitutional: Negative for fever. + chills Eyes: Negative for visual changes. ENT: Negative for sore throat. Neck: No neck pain  Cardiovascular: Negative for chest pain. Respiratory: Negative for shortness of breath. Gastrointestinal: + diffuse abdominal pain, vomiting and diarrhea. Genitourinary: Negative  for dysuria. Musculoskeletal: Negative for back pain. Skin: Negative for rash. Neurological: Negative for headaches, weakness or numbness. Psych: No SI or HI  ____________________________________________   PHYSICAL EXAM:  VITAL SIGNS: ED Triage Vitals [08/28/16 0609]  Enc Vitals Group     BP  136/84     Pulse Rate (!) 120     Resp 18     Temp 98.7 F (37.1 C)     Temp Source Oral     SpO2 96 %     Weight 216 lb (98 kg)     Height 5\' 11"  (1.803 m)     Head Circumference      Peak Flow      Pain Score 7     Pain Loc      Pain Edu?      Excl. in GC?     Constitutional: Alert and oriented. Well appearing and in no apparent distress. HEENT:      Head: Normocephalic and atraumatic.         Eyes: Conjunctivae are normal. Sclera is non-icteric.       Mouth/Throat: Mucous membranes are moist.       Neck: Supple with no signs of meningismus. Cardiovascular: Tachycardic with regular rhythm. No murmurs, gallops, or rubs. 2+ symmetrical distal pulses are present in all extremities. No JVD. Respiratory: Normal respiratory effort. Lungs are clear to auscultation bilaterally. No wheezes, crackles, or rhonchi.  Gastrointestinal: Soft, diffusely tender to palpation, non distended with positive bowel sounds. No rebound or guarding. Musculoskeletal: Nontender with normal range of motion in all extremities. No edema, cyanosis, or erythema of extremities. Neurologic: Normal speech and language. Face is symmetric. Moving all extremities. No gross focal neurologic deficits are appreciated. Skin: Skin is warm, dry and intact. No rash noted. Psychiatric: Mood and affect are normal. Speech and behavior are normal.  ____________________________________________   LABS (all labs ordered are listed, but only abnormal results are displayed)  Labs Reviewed  CBC WITH DIFFERENTIAL/PLATELET - Abnormal; Notable for the following:       Result Value   RDW 17.4 (*)    All other components within normal limits  COMPREHENSIVE METABOLIC PANEL - Abnormal; Notable for the following:    Sodium 132 (*)    Chloride 95 (*)    CO2 14 (*)    Glucose, Bld 154 (*)    BUN 39 (*)    Creatinine, Ser 2.20 (*)    Total Protein 8.3 (*)    AST 54 (*)    Total Bilirubin 2.6 (*)    GFR calc non Af Amer 31 (*)     GFR calc Af Amer 36 (*)    Anion gap 23 (*)    All other components within normal limits  TROPONIN I - Abnormal; Notable for the following:    Troponin I 0.03 (*)    All other components within normal limits  C DIFFICILE QUICK SCREEN W PCR REFLEX  GASTROINTESTINAL PANEL BY PCR, STOOL (REPLACES STOOL CULTURE)  LIPASE, BLOOD  ETHANOL  TROPONIN I  HIV ANTIBODY (ROUTINE TESTING)  TROPONIN I  TROPONIN I   ____________________________________________  EKG  ED ECG REPORT I, Nita Sicklearolina Salley Boxley, the attending physician, personally viewed and interpreted this ECG.  Sinus tachycardia, rate of 113, normal intervals, left axis deviation, no ST elevations or depressions, abnormal R wave progression. Unchanged from prior from 02/2016  ____________________________________________  RADIOLOGY  CXR: No active cardiopulmonary disease  CT a/p: 1. Again seen are thickened and inflamed  loops of small bowel within the central abdomen and lower quadrants. Findings are nonspecific, and can be seen in the setting of infection, inflammation, or ischemia. No evidence of pneumoperitoneum. 2. Hepatic steatosis. 3. Partially visualized enlarging prevascular space lymph node, now measuring 13 mm, previously 9 mm. Chest CT is recommended for further evaluation. 4. Aortic Atherosclerosis (ICD10-I70.0). ____________________________________________   PROCEDURES  Procedure(s) performed: None Procedures Critical Care performed:  None ____________________________________________   INITIAL IMPRESSION / ASSESSMENT AND PLAN / ED COURSE   56 y.o. male history of alcohol abuse, diverticulitis, hypertension, hyperlipidemia, diabetes who presents for evaluation of abdominal pain, vomiting and diarrhea since last night. Patient is in no distress, is tachycardic and afebrile, normotensive, abdomen is soft with diffuse tenderness throughout. Differential diagnoses including diverticulitis versus enteritis versus  C. Difficile. Will give IVF, analgesia, check labs, stool for C. Diff and get CT a/p.  Clinical Course as of Aug 28 1557  Thu Aug 28, 2016  1056 Labs showing acute kidney injury with a creatinine of 2.20, anion gap of 23. Patient is receiving IV fluids. CT scan concerning for enteritis. Pending is stool sample at this time. Vitals are improving with IV fluids. We'll admit to the hospitalist service.  [CV]    Clinical Course User Index [CV] Nita SickleVeronese, [place], MD    Pertinent labs & imaging results that were available during my care of the patient were reviewed by me and considered in my medical decision making (see chart for details).    ____________________________________________   FINAL CLINICAL IMPRESSION(S) / ED DIAGNOSES  Final diagnoses:  Enteritis  Acute kidney injury (HCC)  Diarrhea of presumed infectious origin  High anion gap metabolic acidosis      NEW MEDICATIONS STARTED DURING THIS VISIT:  Current Discharge Medication List       Note:  This document was prepared using Dragon voice recognition software and may include unintentional dictation errors.    Don PerkingVeronese, [place], MD 08/28/16 1600

## 2016-08-29 DIAGNOSIS — R197 Diarrhea, unspecified: Secondary | ICD-10-CM | POA: Diagnosis not present

## 2016-08-29 DIAGNOSIS — N170 Acute kidney failure with tubular necrosis: Secondary | ICD-10-CM | POA: Diagnosis not present

## 2016-08-29 LAB — BASIC METABOLIC PANEL
Anion gap: 11 (ref 5–15)
BUN: 34 mg/dL — AB (ref 6–20)
CALCIUM: 8.2 mg/dL — AB (ref 8.9–10.3)
CO2: 25 mmol/L (ref 22–32)
CREATININE: 1.57 mg/dL — AB (ref 0.61–1.24)
Chloride: 100 mmol/L — ABNORMAL LOW (ref 101–111)
GFR calc Af Amer: 54 mL/min — ABNORMAL LOW (ref >60)
GFR, EST NON AFRICAN AMERICAN: 47 mL/min — AB (ref >60)
GLUCOSE: 114 mg/dL — AB (ref 65–99)
POTASSIUM: 3.7 mmol/L (ref 3.5–5.1)
SODIUM: 136 mmol/L (ref 135–145)

## 2016-08-29 LAB — CBC
HEMATOCRIT: 35.7 % — AB (ref 40.0–52.0)
Hemoglobin: 11.7 g/dL — ABNORMAL LOW (ref 13.0–18.0)
MCH: 29.5 pg (ref 26.0–34.0)
MCHC: 32.7 g/dL (ref 32.0–36.0)
MCV: 90.2 fL (ref 80.0–100.0)
PLATELETS: 137 10*3/uL — AB (ref 150–440)
RBC: 3.96 MIL/uL — ABNORMAL LOW (ref 4.40–5.90)
RDW: 16.6 % — AB (ref 11.5–14.5)
WBC: 3.2 10*3/uL — AB (ref 3.8–10.6)

## 2016-08-29 LAB — HIV ANTIBODY (ROUTINE TESTING W REFLEX): HIV SCREEN 4TH GENERATION: NONREACTIVE

## 2016-08-29 LAB — GLUCOSE, CAPILLARY
GLUCOSE-CAPILLARY: 117 mg/dL — AB (ref 65–99)
GLUCOSE-CAPILLARY: 126 mg/dL — AB (ref 65–99)

## 2016-08-29 MED ORDER — DEXTROSE 5 % IV SOLN
2.0000 g | INTRAVENOUS | Status: DC
  Filled 2016-08-29: qty 2

## 2016-08-29 MED ORDER — METRONIDAZOLE 500 MG PO TABS
500.0000 mg | ORAL_TABLET | Freq: Three times a day (TID) | ORAL | 0 refills | Status: DC
Start: 2016-08-29 — End: 2016-08-29

## 2016-08-29 MED ORDER — CIPROFLOXACIN HCL 250 MG PO TABS: 250.0000 mg | ORAL_TABLET | Freq: Two times a day (BID) | ORAL | 0 refills | Status: DC

## 2016-08-29 MED ORDER — METRONIDAZOLE 500 MG PO TABS
500.0000 mg | ORAL_TABLET | Freq: Three times a day (TID) | ORAL | Status: DC
  Filled 2016-08-29 (×2): qty 1

## 2016-08-29 MED ORDER — CIPROFLOXACIN HCL 500 MG PO TABS: 250.0000 mg | ORAL_TABLET | Freq: Two times a day (BID) | ORAL | Status: DC

## 2016-08-29 NOTE — Progress Notes (Signed)
Pt to be discharged per MD order. IV removed. Instructions reviewed with pt whom verbalizes understanding. No new medications. Will discharge in wheelchair when ready.

## 2016-08-29 NOTE — Discharge Summary (Addendum)
[facility] - [place] at [facility]   PATIENT NAME: George Gomez    MR#:  [id number]  DATE OF BIRTH:  1960-03-13  DATE OF ADMISSION:  08/28/2016   ADMITTING PHYSICIAN: Houston Siren, MD  DATE OF DISCHARGE: 08/29/2016  PRIMARY CARE PHYSICIAN: [facility]   ADMISSION DIAGNOSIS:   Diarrhea of presumed infectious origin [R19.7] Enteritis [K52.9] High anion gap metabolic acidosis [E87.2] Acute kidney injury (HCC) [N17.9]  DISCHARGE DIAGNOSIS:   Active Problems:   Acute renal failure (ARF) (HCC)   SECONDARY DIAGNOSIS:   Past Medical History:  Diagnosis Date  . Anemia   . Diabetes mellitus without complication (HCC)   . Diverticulitis    Pt states diverticulitis  . Hypercholesteremia   . Hypertension     HOSPITAL COURSE:   56 year old male with past medical history significant for obesity, anemia, diabetes, hypertension, chronic alcohol abuse presents to hospital secondary to abdominal pain, nausea vomiting and diarrhea.  #1 acute gastroenteritis-denies any recent travel but has been eating frozen foods -Stool study is positive for enteral pathogenic Escherichia coli. Was on Rocephin. -Stool positive for C. difficile antigen, had been treated for C. difficile in February 2018. -No antibiotics needed to treat EPEC as it's self-limiting. No indication to treat C. difficile antigen as toxin is negative at this time. Discussed with infectious disease specialist -Discontinue all antibiotics. Patient is able to tolerate regular diet. -CT of the abdomen showing inflammation in the small bowel which was also present in February 2018. -Outpatient GI follow-up recommended for the same.  #2 hypertension-continue lisinopril  #3 diabetes mellitus-continue metformin  #4 neuropathy-on gabapentin  #5 acute renal failure on CK D-creatinine has improved to baseline of 1.5 today with fluids. Likely ATN on admission  Patient is stable and will  be discharged today with outpatient follow-up.  DISCHARGE CONDITIONS:   Guarded CONSULTS OBTAINED:    none  DRUG ALLERGIES:   No Known Allergies DISCHARGE MEDICATIONS:   Allergies as of 08/29/2016   No Known Allergies     Medication List    STOP taking these medications   vancomycin 50 mg/mL oral solution Commonly known as:  VANCOCIN     TAKE these medications   allopurinol 300 MG tablet Commonly known as:  ZYLOPRIM Take 1 tablet by mouth daily.   atorvastatin 40 MG tablet Commonly known as:  LIPITOR Take 1 tablet by mouth daily.   gabapentin 300 MG capsule Commonly known as:  NEURONTIN Take 300 mg by mouth 2 (two) times daily.   lisinopril 10 MG tablet Commonly known as:  PRINIVIL,ZESTRIL Take 10 mg by mouth daily.   metFORMIN 500 MG 24 hr tablet Commonly known as:  GLUCOPHAGE-XR Take 2 tablets by mouth daily.   polyvinyl alcohol 1.4 % ophthalmic solution Commonly known as:  LIQUIFILM TEARS Place 1 drop into both eyes as needed for dry eyes.   thiamine 250 MG tablet Take 1 tablet (250 mg total) by mouth daily.   VITAMIN B-12 IJ Inject 1 Dose as directed every 30 (thirty) days. What changed:  Another medication with the same name was removed. Continue taking this medication, and follow the directions you see here.        DISCHARGE INSTRUCTIONS:   1. GI follow-up in 1-2 weeks 2. PCP follow-up in 1-2 weeks  DIET:   Cardiac diet and Diabetic diet  ACTIVITY:   Activity as tolerated  OXYGEN:   Home Oxygen: No.  Oxygen Delivery: room air  DISCHARGE  LOCATION:   home   If you experience worsening of your admission symptoms, develop shortness of breath, life threatening emergency, suicidal or homicidal thoughts you must seek medical attention immediately by calling 911 or calling your MD immediately  if symptoms less severe.  You Must read complete instructions/literature along with all the possible adverse reactions/side effects for all the  Medicines you take and that have been prescribed to you. Take any new Medicines after you have completely understood and accpet all the possible adverse reactions/side effects.   Please note  You were cared for by a hospitalist during your hospital stay. If you have any questions about your discharge medications or the care you received while you were in the hospital after you are discharged, you can call the unit and asked to speak with the hospitalist on call if the hospitalist that took care of you is not available. Once you are discharged, your primary care physician will handle any further medical issues. Please note that NO REFILLS for any discharge medications will be authorized once you are discharged, as it is imperative that you return to your primary care physician (or establish a relationship with a primary care physician if you do not have one) for your aftercare needs so that they can reassess your need for medications and monitor your lab values.    On the day of Discharge:  VITAL SIGNS:   Blood pressure 131/86, pulse 89, temperature 98.3 F (36.8 C), temperature source Oral, resp. rate 20, height 5\' 11"  (1.803 m), weight 98 kg (216 lb), SpO2 97 %.  PHYSICAL EXAMINATION:    GENERAL:  56 y.o.-year-old Obese patient lying in the bed with no acute distress.  EYES: Pupils equal, round, reactive to light and accommodation. No scleral icterus. Extraocular muscles intact.  HEENT: Head atraumatic, normocephalic. Oropharynx and nasopharynx clear.  NECK:  Supple, no jugular venous distention. No thyroid enlargement, no tenderness.  LUNGS: Normal breath sounds bilaterally, no wheezing, rales,rhonchi or crepitation. No use of accessory muscles of respiration.  CARDIOVASCULAR: S1, S2 normal. No murmurs, rubs, or gallops.  ABDOMEN: Soft, minimal tenderness in the periumbilical and left lower quadrant regions without guarding or rigidity,, non-distended. Bowel sounds present. No organomegaly  or mass.  EXTREMITIES: No pedal edema, cyanosis, or clubbing.  NEUROLOGIC: Cranial nerves II through XII are intact. Muscle strength 5/5 in all extremities. Sensation intact. Gait not checked.  PSYCHIATRIC: The patient is alert and oriented x 3.  SKIN: No obvious rash, lesion, or ulcer.   DATA REVIEW:   CBC  Recent Labs Lab 08/29/16 0327  WBC 3.2*  HGB 11.7*  HCT 35.7*  PLT 137*    Chemistries   Recent Labs Lab 08/28/16 0634 08/29/16 0327  NA 132* 136  K 4.9 3.7  CL 95* 100*  CO2 14* 25  GLUCOSE 154* 114*  BUN 39* 34*  CREATININE 2.20* 1.57*  CALCIUM 8.9 8.2*  AST 54*  --   ALT 39  --   ALKPHOS 81  --   BILITOT 2.6*  --      Microbiology Results  Results for orders placed or performed during the hospital encounter of 08/28/16  C difficile quick scan w PCR reflex     Status: Abnormal   Collection Time: 08/28/16  4:03 PM  Result Value Ref Range Status   C Diff antigen POSITIVE (A) NEGATIVE Final   C Diff toxin NEGATIVE NEGATIVE Final   C Diff interpretation Results are indeterminate. See PCR results.  Final  Gastrointestinal Panel by PCR , Stool     Status: Abnormal   Collection Time: 08/28/16  4:03 PM  Result Value Ref Range Status   Campylobacter species NOT DETECTED NOT DETECTED Final   Plesimonas shigelloides NOT DETECTED NOT DETECTED Final   Salmonella species NOT DETECTED NOT DETECTED Final   Yersinia enterocolitica NOT DETECTED NOT DETECTED Final   Vibrio species NOT DETECTED NOT DETECTED Final   Vibrio cholerae NOT DETECTED NOT DETECTED Final   Enteroaggregative E coli (EAEC) NOT DETECTED NOT DETECTED Final   Enteropathogenic E coli (EPEC) DETECTED (A) NOT DETECTED Final    Comment: RESULT CALLED TO, READ BACK BY AND VERIFIED WITH: BETH RICHARDSON AT 1818 08/28/2016 BY TFK.    Enterotoxigenic E coli (ETEC) NOT DETECTED NOT DETECTED Final   Shiga like toxin producing E coli (STEC) NOT DETECTED NOT DETECTED Final   Shigella/Enteroinvasive E coli  (EIEC) NOT DETECTED NOT DETECTED Final   Cryptosporidium NOT DETECTED NOT DETECTED Final   Cyclospora cayetanensis NOT DETECTED NOT DETECTED Final   Entamoeba histolytica NOT DETECTED NOT DETECTED Final   Giardia lamblia NOT DETECTED NOT DETECTED Final   Adenovirus F40/41 NOT DETECTED NOT DETECTED Final   Astrovirus NOT DETECTED NOT DETECTED Final   Norovirus GI/GII NOT DETECTED NOT DETECTED Final   Rotavirus A NOT DETECTED NOT DETECTED Final   Sapovirus (I, II, IV, and V) NOT DETECTED NOT DETECTED Final  Clostridium Difficile by PCR     Status: None   Collection Time: 08/28/16  4:03 PM  Result Value Ref Range Status   Toxigenic C Difficile by pcr NEGATIVE NEGATIVE Final    Comment: Patient is colonized with non toxigenic C. difficile. May not need treatment unless significant symptoms are present.    RADIOLOGY:  No results found.   Management plans discussed with the patient, family and they are in agreement.  CODE STATUS:     Code Status Orders        Start     Ordered   08/28/16 1310  Full code  Continuous     08/28/16 1309    Code Status History    Date Active Date Inactive Code Status Order ID Comments User Context   03/18/2016  4:27 AM 03/20/2016  1:53 PM Full Code [id number]  Ihor AustinPyreddy, [place], MD Inpatient      TOTAL TIME TAKING CARE OF THIS PATIENT: 37 minutes.    Zuria Fosdick M.D on 08/29/2016 at 3:03 PM  Between 7am to 6pm - Pager - [phone]  After 6pm go to [email or web address] - Scientist, research (life sciences)password [email or web address]  [facility] [place] [facility]  Office  [phone]  CC: Primary care physician; [facility]   Note: This dictation was prepared with Nurse, children'sDragon dictation along with smaller Lobbyistphrase technology. Any transcriptional errors that result from this process are unintentional.

## 2016-09-09 ENCOUNTER — Telehealth: Payer: Self-pay | Admitting: Gastroenterology

## 2016-09-09 ENCOUNTER — Encounter: Payer: Self-pay | Admitting: Gastroenterology

## 2016-09-09 NOTE — Telephone Encounter (Signed)
Attempted to leave message for patient to reschedule appt. No voice mail. Letter sent

## 2016-10-02 ENCOUNTER — Ambulatory Visit: Payer: Medicare Other | Admitting: Gastroenterology

## 2016-10-15 ENCOUNTER — Other Ambulatory Visit: Payer: Self-pay

## 2016-10-15 ENCOUNTER — Encounter: Payer: Self-pay | Admitting: Gastroenterology

## 2016-10-15 ENCOUNTER — Ambulatory Visit: Payer: Medicare Other | Admitting: Gastroenterology

## 2016-11-04 ENCOUNTER — Encounter: Payer: Self-pay | Admitting: Gastroenterology

## 2016-11-04 ENCOUNTER — Ambulatory Visit: Payer: Medicare Other | Admitting: Gastroenterology

## 2016-11-04 ENCOUNTER — Ambulatory Visit (INDEPENDENT_AMBULATORY_CARE_PROVIDER_SITE_OTHER): Payer: Medicare Other | Admitting: Gastroenterology

## 2016-11-04 ENCOUNTER — Encounter (INDEPENDENT_AMBULATORY_CARE_PROVIDER_SITE_OTHER): Payer: Self-pay

## 2016-11-04 ENCOUNTER — Other Ambulatory Visit
Admission: RE | Admit: 2016-11-04 | Discharge: 2016-11-04 | Disposition: A | Discharge status: Home / Self Care | Payer: Medicare Other | Source: Ambulatory Visit | Attending: Gastroenterology | Admitting: Gastroenterology

## 2016-11-04 VITALS — BP 159/84 | HR 80 | Temp 98.9°F | Ht 71.0 in | Wt 222.6 lb

## 2016-11-04 DIAGNOSIS — R197 Diarrhea, unspecified: Secondary | ICD-10-CM

## 2016-11-04 NOTE — Patient Instructions (Addendum)
Use Citracel daily as directed by Dr. Servando Snare.

## 2016-11-04 NOTE — Progress Notes (Signed)
Gastroenterology Consultation  Referring Provider:     [facility] Primary Care Physician:  [facility] Primary Gastroenterologist:  Dr. Servando Snare     Reason for Consultation:     Here for follow-up after being in the hospital.        HPI:   George Gomez is a 56 y.o. y/o male referred for consultation & management of Hospital follow-up by Dr. Theodoro Doing, [facility].  This patient was in the hospital after reporting diarrhea and abdominal pain. The patient was found to have Escherichia coli Entero-pathogenic (EPEC). The patient reports that he been doing well at home since discharge. The patient was in the hospital back in August. He does report that when he eats he sometimes has to run to the bathroom. The patient also had C. difficile colitis back in February. There is no report of any black stools or bloody stools. He also denies any unexplained weight loss. The patient did have a CT scan of the abdomen showing small bowel inflammation. After discharge in August the patient was told to follow-up with GI in one to 2 weeks but only comes here today.  Past Medical History:  Diagnosis Date  . Anemia   . Diabetes mellitus without complication (HCC)   . Diverticulitis    Pt states diverticulitis  . Hypercholesteremia   . Hypertension     Past Surgical History:  Procedure Laterality Date  . none      Prior to Admission medications   Medication Sig Start Date End Date Taking? Authorizing Provider  allopurinol (ZYLOPRIM) 300 MG tablet Take 1 tablet by mouth daily. 02/21/16  Yes [provider]  amLODipine (NORVASC) 10 MG tablet Take 10 mg by mouth.   Yes [provider]  aspirin EC 81 MG tablet Take 81 mg by mouth.   Yes [provider]  atorvastatin (LIPITOR) 40 MG tablet Take 1 tablet by mouth daily. 02/21/16  Yes [provider]  Cyanocobalamin (VITAMIN B-12 IJ) Inject 1 Dose as directed every 30 (thirty) days.    Yes [provider]  gabapentin (NEURONTIN) 300 MG capsule Take 300 mg by mouth 2 (two) times daily. 02/18/16  Yes [provider]  metFORMIN (GLUCOPHAGE-XR) 500 MG 24 hr tablet Take 2 tablets by mouth daily. 03/05/16  Yes [provider]  polyvinyl alcohol (LIQUIFILM TEARS) 1.4 % ophthalmic solution Place 1 drop into both eyes as needed for dry eyes. 03/20/16  Yes Katharina Caper, MD  thiamine 250 MG tablet Take 1 tablet (250 mg total) by mouth daily. 03/21/16  Yes Katharina Caper, MD    Family History  Problem Relation Age of Onset  . Rheum arthritis Mother   . Diabetes Father   . Heart disease Father      Social History  Substance Use Topics  . Smoking status: Never Smoker  . Smokeless tobacco: Never Used  . Alcohol use Yes     Comment: 2-3 drinks/day    Allergies as of 11/04/2016  . (No Known Allergies)    Review of Systems:    All systems reviewed and negative except where noted in HPI.   Physical Exam:  BP (!) 159/84   Pulse 80   Temp 98.9 F (37.2 C) (Oral)   Ht  (1.803 m)   Wt 222 lb 9.6 oz (101 kg)   BMI 31.05 kg/m  No LMP for male patient. Psych:  Alert and cooperative. Normal mood and affect. General:  Alert,  Well-developed, well-nourished, pleasant and cooperative in NAD Head:  Normocephalic and atraumatic. Eyes:  Sclera clear, no icterus.   Conjunctiva pink. Ears:  Normal auditory acuity. Nose:  No deformity, discharge, or lesions. Mouth:  No deformity or lesions,oropharynx pink & moist. Neck:  Supple; no masses or thyromegaly. Lungs:  Respirations even and unlabored.  Clear throughout to auscultation.   No wheezes, crackles, or rhonchi. No acute distress. Heart:  Regular rate and rhythm; no murmurs, clicks, rubs, or gallops. Abdomen:  Normal bowel sounds.  No bruits.  Soft, non-tender and non-distended without masses, hepatosplenomegaly or hernias noted.  No guarding or rebound tenderness.  Negative Carnett sign.   Rectal:   Deferred.  Msk:  Symmetrical without gross deformities.  Good, equal movement & strength bilaterally. Pulses:  Normal pulses noted. Extremities:  No clubbing or edema.  No cyanosis. Neurologic:  Alert and oriented x3;  grossly normal neurologically. Skin:  Intact without significant lesions or rashes.  No jaundice. Lymph Nodes:  No significant cervical adenopathy. Psych:  Alert and cooperative. Normal mood and affect.  Imaging Studies: No results found.  Assessment and Plan:   George Gomez is a 57 y.o. y/o male who was in the hospital with  Entero-pathogenic Escherichia coli. The patient also has a history of C. difficile. He now reports that he has some urgency to run to the bathroom and move his bowels after eating. There is no report of the diarrhea waking him up at night. The patient also reports that he drinks approximate 3 shots of moonshine a day and he can sometimes have diarrhea after the first shot. The patient will have his stools checked again for pathogens. He is also been told to take Citrucel if the stool tests are negative. 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 The patient has been explained the plan and agrees with it.  Midge Minium, MD. Clementeen Graham   Note: This dictation was prepared with Dragon dictation along with smaller phrase technology. Any transcriptional errors that result from this process are unintentional.

## 2016-11-05 ENCOUNTER — Other Ambulatory Visit
Admission: RE | Admit: 2016-11-05 | Discharge: 2016-11-05 | Disposition: A | Discharge status: Home / Self Care | Payer: Medicare Other | Source: Ambulatory Visit | Attending: Gastroenterology | Admitting: Gastroenterology

## 2016-11-05 DIAGNOSIS — R197 Diarrhea, unspecified: Secondary | ICD-10-CM | POA: Diagnosis present

## 2016-11-05 LAB — GASTROINTESTINAL PANEL BY PCR, STOOL (REPLACES STOOL CULTURE)

## 2016-11-06 ENCOUNTER — Telehealth: Payer: Self-pay

## 2016-11-06 NOTE — Telephone Encounter (Signed)
Tried contacting pt but mailbox was full.  

## 2016-11-06 NOTE — Telephone Encounter (Signed)
-----   Message from Midge Minium, MD sent at 11/06/2016 11:04 AM EDT ----- Let the patient know that his stool test showed his infection has cleared up. The patient should take fiber once or twice a day. If this does not help his stool consistency please have him contact us.

## 2016-11-06 NOTE — Telephone Encounter (Signed)
Patient called back for results. He will have his phone with him. He knows his VM is full but doesn't know how to clear it. Please call back today.

## 2016-11-06 NOTE — Telephone Encounter (Signed)
Pt notified of results

## 2017-05-27 ENCOUNTER — Emergency Department
Admission: EM | Admit: 2017-05-27 | Discharge: 2017-05-27 | Disposition: A | Discharge status: Home / Self Care | Payer: Medicare Other | Attending: Emergency Medicine | Admitting: Emergency Medicine

## 2017-05-27 ENCOUNTER — Emergency Department: Payer: Medicare Other

## 2017-05-27 ENCOUNTER — Encounter: Payer: Self-pay | Admitting: Emergency Medicine

## 2017-05-27 ENCOUNTER — Other Ambulatory Visit: Payer: Self-pay

## 2017-05-27 DIAGNOSIS — I129 Hypertensive chronic kidney disease with stage 1 through stage 4 chronic kidney disease, or unspecified chronic kidney disease: Secondary | ICD-10-CM | POA: Insufficient documentation

## 2017-05-27 DIAGNOSIS — M25512 Pain in left shoulder: Secondary | ICD-10-CM | POA: Insufficient documentation

## 2017-05-27 DIAGNOSIS — Z7982 Long term (current) use of aspirin: Secondary | ICD-10-CM | POA: Diagnosis not present

## 2017-05-27 DIAGNOSIS — E1122 Type 2 diabetes mellitus with diabetic chronic kidney disease: Secondary | ICD-10-CM | POA: Insufficient documentation

## 2017-05-27 DIAGNOSIS — Z79899 Other long term (current) drug therapy: Secondary | ICD-10-CM | POA: Diagnosis not present

## 2017-05-27 DIAGNOSIS — M542 Cervicalgia: Secondary | ICD-10-CM | POA: Insufficient documentation

## 2017-05-27 DIAGNOSIS — N189 Chronic kidney disease, unspecified: Secondary | ICD-10-CM | POA: Diagnosis not present

## 2017-05-27 DIAGNOSIS — W108XXA Fall (on) (from) other stairs and steps, initial encounter: Secondary | ICD-10-CM | POA: Insufficient documentation

## 2017-05-27 DIAGNOSIS — Z7984 Long term (current) use of oral hypoglycemic drugs: Secondary | ICD-10-CM | POA: Diagnosis not present

## 2017-05-27 DIAGNOSIS — R51 Headache: Secondary | ICD-10-CM | POA: Diagnosis present

## 2017-05-27 DIAGNOSIS — W19XXXA Unspecified fall, initial encounter: Secondary | ICD-10-CM

## 2017-05-27 HISTORY — DX: Alcohol dependence, uncomplicated: F10.20

## 2017-05-27 MED ORDER — ACETAMINOPHEN 500 MG PO TABS
1000.0000 mg | ORAL_TABLET | Freq: Once | ORAL | Status: AC
  Administered 2017-05-27: 1000 mg via ORAL
  Filled 2017-05-27: qty 2

## 2017-05-27 NOTE — ED Triage Notes (Signed)
Pt in via [facility] from home, reports jumping off a porch on Saturday, possible fall again today but pt unable to recall.  Pt with complaints of bilateral hip pain, right knee pain, right shoulder pain.  ETOH involved.  EDP to bedside.

## 2017-05-27 NOTE — Discharge Instructions (Addendum)
You had CT imaging today which were negative for acute abnormality.  Your CT scan of your neck does show narrowing around the nerves.  If this becomes problematic with neck pain/arm pain please call the number provided for neurosurgery to arrange a follow-up appointment.  Return to the emergency department for any personally concerning symptoms.

## 2017-05-27 NOTE — ED Notes (Signed)
Discharge instructions reviewed. Pt verbalizes understanding. Signature pad not working x 2 computers

## 2017-05-27 NOTE — ED Notes (Signed)
Patient transported to CT 

## 2017-05-27 NOTE — ED Provider Notes (Signed)
[facility] Emergency Department Provider Note  Time seen: 3:27 PM  I have reviewed the triage vital signs and the nursing notes.   HISTORY  Chief Complaint Fall    HPI George Gomez is a 57 y.o. male with a past medical history of alcohol dependence, hypertension, hyperlipidemia, diabetes, presents to the emergency department after a fall.  According to the patient on Sunday he fell off of his front porch landing onto his yard.  States the following day he was feeling okay however over the past 2 days he has been feeling worse pain in his head his neck and both of his shoulders..  States today he was hurting very bad in his left shoulder so he called EMS.  He states EMS evaluated him and said he looked okay but he insisted on coming to the emergency department per patient.  Patient has been ambulatory without issue per patient.  Is not sure if he hit his head or passed out when he fell.  Patient admits to alcohol use today as well as during the time of fall.  Patient is asking for pain medication.  Describes his headache is mild.  Neck and shoulder pain is moderate dull aching pain.  Patient is moving all extremity as well.   Past Medical History:  Diagnosis Date  . Anemia   . Diabetes mellitus without complication (HCC)   . Diverticulitis    Pt states diverticulitis  . EtOH dependence (HCC)   . Hypercholesteremia   . Hypertension     Patient Active Problem List   Diagnosis Date Noted  . Acute renal failure (ARF) (HCC) 08/28/2016  . Acute on chronic renal failure (HCC) 03/20/2016  . Dehydration 03/20/2016  . Lactic acidosis 03/20/2016  . Hyponatremia 03/20/2016  . Alcohol abuse 03/20/2016  . Clostridium difficile diarrhea 03/18/2016    Past Surgical History:  Procedure Laterality Date  . none      Prior to Admission medications   Medication Sig Start Date End Date Taking? Authorizing Provider  allopurinol (ZYLOPRIM) 300 MG tablet Take 1 tablet by  mouth daily. 02/21/16   [provider]  amLODipine (NORVASC) 10 MG tablet Take 10 mg by mouth.    [provider]  aspirin EC 81 MG tablet Take 81 mg by mouth.    [provider]  atorvastatin (LIPITOR) 40 MG tablet Take 1 tablet by mouth daily. 02/21/16   [provider]  Cyanocobalamin (VITAMIN B-12 IJ) Inject 1 Dose as directed every 30 (thirty) days.    [provider]  gabapentin (NEURONTIN) 300 MG capsule Take 300 mg by mouth 2 (two) times daily. 02/18/16   [provider]  metFORMIN (GLUCOPHAGE-XR) 500 MG 24 hr tablet Take 2 tablets by mouth daily. 03/05/16   [provider]  polyvinyl alcohol (LIQUIFILM TEARS) 1.4 % ophthalmic solution Place 1 drop into both eyes as needed for dry eyes. 03/20/16   Katharina Caper, MD  thiamine 250 MG tablet Take 1 tablet (250 mg total) by mouth daily. 03/21/16   Katharina Caper, MD    No Known Allergies  Family History  Problem Relation Age of Onset  . Rheum arthritis Mother   . Diabetes Father   . Heart disease Father     Social History Social History   Tobacco Use  . Smoking status: Never Smoker  . Smokeless tobacco: Never Used  Substance Use Topics  . Alcohol use: Yes    Comment: 2-3 drinks/day  . Drug use:  No    Review of Systems Constitutional: Negative for fever. Eyes: Negative for visual complaints ENT: Patient states congestion. Cardiovascular: Negative for chest pain. Respiratory: Negative for shortness of breath. Gastrointestinal: Negative for abdominal pain, vomiting and diarrhea. Genitourinary: Negative for urinary compaints Musculoskeletal: Neck pain, bilateral shoulder pain. Skin: Negative for skin complaints  Neurological: Positive for headache. All other ROS negative  ____________________________________________   PHYSICAL EXAM:  VITAL SIGNS: ED Triage Vitals  Enc Vitals Group     BP 05/27/17 1521 (!) 103/43     Pulse Rate 05/27/17 1521 (!) 108      Resp 05/27/17 1521 20     Temp 05/27/17 1521 97.8 F (36.6 C)     Temp Source 05/27/17 1521 Oral     SpO2 05/27/17 1521 95 %     Weight 05/27/17 1523 222 lb (100.7 kg)     Height 05/27/17 1523  (1.803 m)     Head Circumference --      Peak Flow --      Pain Score 05/27/17 1523 6     Pain Loc --      Pain Edu? --      Excl. in GC? --     Constitutional: Alert and oriented. Well appearing and in no distress.  Admits to alcohol use today.  Mildly slurred speech. Eyes: Normal exam ENT   Head: Normocephalic and atraumatic.   Mouth/Throat: Mucous membranes are moist. Cardiovascular: Normal rate, regular rhythm. No murmur Respiratory: Normal respiratory effort without tachypnea nor retractions. Breath sounds are clear  Gastrointestinal: Soft and nontender. No distention.   Musculoskeletal: States mild tenderness with C-spine palpation.  States moderate tenderness to bilateral shoulder palpation although moves his shoulders very well with good range of motion, no concern for fracture dislocation clinically.  Neurovascular intact distally.  Great range of motion bilateral lower extremity's without pain elicited.  No T or L-spine tenderness palpated. Neurologic: Slightly slurred speech.  No gross deficits able to move all extremities well. Skin:  Skin is warm, dry and intact.  Small area of contusion/ecchymosis to right shoulder. Psychiatric: Mood and affect are normal.     RADIOLOGY  CT scan of the head and neck are negative for acute abnormality but the patient does have advanced foraminal stenosis/narrowing. Chest x-ray negative  ____________________________________________   INITIAL IMPRESSION / ASSESSMENT AND PLAN / ED COURSE  Pertinent labs & imaging results that were available during my care of the patient were reviewed by me and considered in my medical decision making (see chart for details).  Patient presents to the emergency department for increased pain in his  shoulders neck and head since a fall 3 days ago.  Differential includes contusions, muscular skeletal pain, joint pain, headache, less likely ICH, fracture or dislocation.  Will obtain chest x-ray, CT scan of the head and neck as a precaution we will dose Tylenol for discomfort.  CT imaging shows foraminal stenosing, chest x-ray negative.  I discussed the foraminal stenosis with the patient the need to follow-up with neurosurgery if he begins having neck pain.  Patient agreeable with this plan of care.  No acute abnormalities.  Patient is requesting discharge home. ____________________________________________   FINAL CLINICAL IMPRESSION(S) / ED DIAGNOSES  Thresa Ross, MD 05/27/17 [phone]

## 2017-05-30 ENCOUNTER — Other Ambulatory Visit: Payer: Self-pay

## 2017-05-30 ENCOUNTER — Emergency Department: Payer: Medicare Other

## 2017-05-30 ENCOUNTER — Encounter: Payer: Self-pay | Admitting: Emergency Medicine

## 2017-05-30 ENCOUNTER — Observation Stay
Admission: EM | Admit: 2017-05-30 | Discharge: 2017-06-01 | Disposition: A | Discharge status: Home / Self Care | Payer: Medicare Other | Attending: Internal Medicine | Admitting: Internal Medicine

## 2017-05-30 DIAGNOSIS — F1010 Alcohol abuse, uncomplicated: Secondary | ICD-10-CM | POA: Insufficient documentation

## 2017-05-30 DIAGNOSIS — K2980 Duodenitis without bleeding: Secondary | ICD-10-CM | POA: Insufficient documentation

## 2017-05-30 DIAGNOSIS — K922 Gastrointestinal hemorrhage, unspecified: Principal | ICD-10-CM | POA: Diagnosis present

## 2017-05-30 DIAGNOSIS — E785 Hyperlipidemia, unspecified: Secondary | ICD-10-CM | POA: Diagnosis not present

## 2017-05-30 DIAGNOSIS — E1140 Type 2 diabetes mellitus with diabetic neuropathy, unspecified: Secondary | ICD-10-CM | POA: Insufficient documentation

## 2017-05-30 DIAGNOSIS — Z7982 Long term (current) use of aspirin: Secondary | ICD-10-CM | POA: Diagnosis not present

## 2017-05-30 DIAGNOSIS — Z79899 Other long term (current) drug therapy: Secondary | ICD-10-CM | POA: Diagnosis not present

## 2017-05-30 DIAGNOSIS — Z7984 Long term (current) use of oral hypoglycemic drugs: Secondary | ICD-10-CM | POA: Insufficient documentation

## 2017-05-30 DIAGNOSIS — I10 Essential (primary) hypertension: Secondary | ICD-10-CM | POA: Diagnosis not present

## 2017-05-30 DIAGNOSIS — M109 Gout, unspecified: Secondary | ICD-10-CM | POA: Diagnosis not present

## 2017-05-30 HISTORY — DX: Gastrointestinal hemorrhage, unspecified: K92.2

## 2017-05-30 LAB — COMPREHENSIVE METABOLIC PANEL
ALT: 51 U/L (ref 17–63)
ANION GAP: 13 (ref 5–15)
AST: 74 U/L — ABNORMAL HIGH (ref 15–41)
Albumin: 4.7 g/dL (ref 3.5–5.0)
Alkaline Phosphatase: 79 U/L (ref 38–126)
BILIRUBIN TOTAL: 1 mg/dL (ref 0.3–1.2)
BUN: 28 mg/dL — ABNORMAL HIGH (ref 6–20)
CO2: 23 mmol/L (ref 22–32)
Calcium: 9.6 mg/dL (ref 8.9–10.3)
Chloride: 97 mmol/L — ABNORMAL LOW (ref 101–111)
Creatinine, Ser: 1.84 mg/dL — ABNORMAL HIGH (ref 0.61–1.24)
GFR, EST AFRICAN AMERICAN: 45 mL/min — AB (ref >60)
GFR, EST NON AFRICAN AMERICAN: 38 mL/min — AB (ref >60)
Glucose, Bld: 155 mg/dL — ABNORMAL HIGH (ref 65–99)
POTASSIUM: 4.2 mmol/L (ref 3.5–5.1)
Sodium: 133 mmol/L — ABNORMAL LOW (ref 135–145)
TOTAL PROTEIN: 8.3 g/dL — AB (ref 6.5–8.1)

## 2017-05-30 LAB — LIPASE, BLOOD: Lipase: 50 U/L (ref 11–51)

## 2017-05-30 LAB — CBC
HCT: 38.4 % — ABNORMAL LOW (ref 40.0–52.0)
Hemoglobin: 12.7 g/dL — ABNORMAL LOW (ref 13.0–18.0)
MCH: 30.4 pg (ref 26.0–34.0)
MCHC: 33 g/dL (ref 32.0–36.0)
MCV: 92.2 fL (ref 80.0–100.0)
PLATELETS: 220 10*3/uL (ref 150–440)
RBC: 4.17 MIL/uL — ABNORMAL LOW (ref 4.40–5.90)
RDW: 15.9 % — AB (ref 11.5–14.5)
WBC: 5.3 10*3/uL (ref 3.8–10.6)

## 2017-05-30 LAB — TROPONIN I: Troponin I: <0.03 ng/mL (ref <0.03)

## 2017-05-30 LAB — GLUCOSE, CAPILLARY: Glucose-Capillary: 137 mg/dL — ABNORMAL HIGH (ref 65–99)

## 2017-05-30 LAB — TYPE AND SCREEN
ABO/RH(D): O POS
ANTIBODY SCREEN: NEGATIVE

## 2017-05-30 LAB — PROTIME-INR
INR: 0.92
PROTHROMBIN TIME: 12.3 s (ref 11.4–15.2)

## 2017-05-30 MED ORDER — ATORVASTATIN CALCIUM 20 MG PO TABS
40.0000 mg | ORAL_TABLET | Freq: Every evening | ORAL | Status: DC
  Administered 2017-05-30 – 2017-05-31 (×2): 40 mg via ORAL
  Filled 2017-05-30 (×2): qty 2

## 2017-05-30 MED ORDER — ALLOPURINOL 100 MG PO TABS
300.0000 mg | ORAL_TABLET | Freq: Every day | ORAL | Status: DC
  Administered 2017-05-31 – 2017-06-01 (×2): 300 mg via ORAL
  Filled 2017-05-30 (×2): qty 3

## 2017-05-30 MED ORDER — GABAPENTIN 300 MG PO CAPS
300.0000 mg | ORAL_CAPSULE | Freq: Two times a day (BID) | ORAL | Status: DC
  Administered 2017-05-30 – 2017-05-31 (×3): 300 mg via ORAL
  Filled 2017-05-30 (×3): qty 1

## 2017-05-30 MED ORDER — ACETAMINOPHEN 325 MG PO TABS: 650.0000 mg | ORAL_TABLET | Freq: Four times a day (QID) | ORAL | Status: DC | PRN

## 2017-05-30 MED ORDER — VITAMIN B-1 100 MG PO TABS
100.0000 mg | ORAL_TABLET | Freq: Every day | ORAL | Status: DC
  Administered 2017-05-30 – 2017-06-01 (×3): 100 mg via ORAL
  Filled 2017-05-30 (×3): qty 1

## 2017-05-30 MED ORDER — LORAZEPAM 1 MG PO TABS: 1.0000 mg | ORAL_TABLET | Freq: Four times a day (QID) | ORAL | Status: DC | PRN

## 2017-05-30 MED ORDER — ADULT MULTIVITAMIN W/MINERALS CH
1.0000 | ORAL_TABLET | Freq: Every day | ORAL | Status: DC
  Administered 2017-05-30 – 2017-06-01 (×3): 1 via ORAL
  Filled 2017-05-30 (×3): qty 1

## 2017-05-30 MED ORDER — PANTOPRAZOLE SODIUM 40 MG IV SOLR
40.0000 mg | Freq: Once | INTRAVENOUS | Status: AC
  Administered 2017-05-30: 40 mg via INTRAVENOUS
  Filled 2017-05-30: qty 40

## 2017-05-30 MED ORDER — SODIUM CHLORIDE 0.9 % IV BOLUS
500.0000 mL | Freq: Once | INTRAVENOUS | Status: AC
  Administered 2017-05-30: 500 mL via INTRAVENOUS

## 2017-05-30 MED ORDER — ONDANSETRON HCL 4 MG/2ML IJ SOLN: 4.0000 mg | Freq: Four times a day (QID) | INTRAMUSCULAR | Status: DC | PRN

## 2017-05-30 MED ORDER — PANTOPRAZOLE SODIUM 40 MG PO TBEC
40.0000 mg | DELAYED_RELEASE_TABLET | Freq: Two times a day (BID) | ORAL | Status: DC
Start: 2017-05-30 — End: 2017-06-01
  Administered 2017-05-30 – 2017-05-31 (×3): 40 mg via ORAL
  Filled 2017-05-30 (×3): qty 1

## 2017-05-30 MED ORDER — LORAZEPAM 2 MG/ML IJ SOLN: 1.0000 mg | Freq: Four times a day (QID) | INTRAMUSCULAR | Status: DC | PRN

## 2017-05-30 MED ORDER — THIAMINE HCL 100 MG/ML IJ SOLN
100.0000 mg | Freq: Every day | INTRAMUSCULAR | Status: DC
  Filled 2017-05-30: qty 2

## 2017-05-30 MED ORDER — INSULIN ASPART 100 UNIT/ML ~~LOC~~ SOLN: 0.0000 [IU] | Freq: Every day | SUBCUTANEOUS | Status: DC

## 2017-05-30 MED ORDER — INSULIN ASPART 100 UNIT/ML ~~LOC~~ SOLN
0.0000 [IU] | Freq: Three times a day (TID) | SUBCUTANEOUS | Status: DC
  Administered 2017-05-31 – 2017-06-01 (×2): 1 [IU] via SUBCUTANEOUS
  Filled 2017-05-30 (×2): qty 1

## 2017-05-30 MED ORDER — ACETAMINOPHEN 650 MG RE SUPP: 650.0000 mg | Freq: Four times a day (QID) | RECTAL | Status: DC | PRN

## 2017-05-30 MED ORDER — FOLIC ACID 1 MG PO TABS
1.0000 mg | ORAL_TABLET | Freq: Every day | ORAL | Status: DC
  Administered 2017-05-30 – 2017-06-01 (×3): 1 mg via ORAL
  Filled 2017-05-30 (×3): qty 1

## 2017-05-30 MED ORDER — ONDANSETRON HCL 4 MG PO TABS: 4.0000 mg | ORAL_TABLET | Freq: Four times a day (QID) | ORAL | Status: DC | PRN

## 2017-05-30 NOTE — ED Notes (Signed)
Attempted report, RN unavailable.

## 2017-05-30 NOTE — ED Notes (Signed)
Hospitalist Provider at bedside. 

## 2017-05-30 NOTE — ED Notes (Signed)
ED TO INPATIENT HANDOFF REPORT  Name/Age/Gender George Gomez 57 y.o. male  Code Status Code Status History    Date Active Date Inactive Code Status Order ID Comments User Context   08/28/2016 1309 08/29/2016 1823 Full Code [id number]  Henreitta Leber, MD Inpatient   03/18/2016 0427 03/20/2016 1353 Full Code [id number]  Saundra Shelling, MD Inpatient      Home/SNF/Other Home  Chief Complaint Dizzy  Level of Care/Admitting Diagnosis ED Disposition    ED Disposition Condition [place]: [place] [100120]  Level of Care: Med-Surg [16]  Diagnosis: GI bleed [774128]  Admitting Physician: Henreitta Leber [[id number]]  Attending Physician: Henreitta Leber [[id number]]  PT Class (Do Not Modify): Observation [104]  PT Acc Code (Do Not Modify): Observation [10022]       Medical History Past Medical History:  Diagnosis Date  . Anemia   . Diabetes mellitus without complication ([place])   . Diverticulitis    Pt states diverticulitis  . EtOH dependence ([place])   . Hypercholesteremia   . Hypertension     Allergies No Known Allergies  IV Location/Drains/Wounds Patient Lines/Drains/Airways Status   Active Line/Drains/Airways    Name:   Placement date:   Placement time:   Site:   Days:   Peripheral IV 05/30/17 Left Antecubital   05/30/17    1900    Antecubital   less than 1          Labs/Imaging Results for orders placed or performed during the hospital encounter of 05/30/17 (from the past 48 hour(s))  CBC     Status: Abnormal   Collection Time: 05/30/17  6:00 PM  Result Value Ref Range   WBC 5.3 3.8 - 10.6 K/uL   RBC 4.17 (L) 4.40 - 5.90 MIL/uL   Hemoglobin 12.7 (L) 13.0 - 18.0 g/dL   HCT 38.4 (L) 40.0 - 52.0 %   MCV 92.2 80.0 - 100.0 fL   MCH 30.4 26.0 - 34.0 pg   MCHC 33.0 32.0 - 36.0 g/dL   RDW 15.9 (H) 11.5 - 14.5 %   Platelets 220 150 - 440 K/uL    Comment: Performed at [facility], [address] [place], [place]  [id number]  Troponin I     Status: None   Collection Time: 05/30/17  6:00 PM  Result Value Ref Range   Troponin I <0.03 <0.03 ng/mL    Comment: Performed at [facility], [address] [place],  [address]  Comprehensive metabolic panel     Status: Abnormal   Collection Time: 05/30/17  6:00 PM  Result Value Ref Range   Sodium 133 (L) 135 - 145 mmol/L   Potassium 4.2 3.5 - 5.1 mmol/L   Chloride 97 (L) 101 - 111 mmol/L   CO2 23 22 - 32 mmol/L   Glucose, Bld 155 (H) 65 - 99 mg/dL   BUN 28 (H) 6 - 20 mg/dL   Creatinine, Ser 1.84 (H) 0.61 - 1.24 mg/dL   Calcium 9.6 8.9 - 10.3 mg/dL   Total Protein 8.3 (H) 6.5 - 8.1 g/dL   Albumin 4.7 3.5 - 5.0 g/dL   AST 74 (H) 15 - 41 U/L   ALT 51 17 - 63 U/L   Alkaline Phosphatase 79 38 - 126 U/L   Total Bilirubin 1.0 0.3 - 1.2 mg/dL   GFR calc non Af Amer 38 (L) >60 mL/min   GFR calc Af Amer 45 (L) >60 mL/min  Comment: (NOTE) The eGFR has been calculated using the CKD EPI equation. This calculation has not been validated in all clinical situations. eGFR's persistently <60 mL/min signify possible Chronic Kidney Disease.    Anion gap 13 5 - 15    Comment: Performed at [facility], [address] [place], [place] [address]  Lipase, blood     Status: None   Collection Time: 05/30/17  6:00 PM  Result Value Ref Range   Lipase 50 11 - 51 U/L    Comment: Performed at [facility], [address] [place], [place] [address]  Protime-INR     Status: None   Collection Time: 05/30/17  7:11 PM  Result Value Ref Range   Prothrombin Time 12.3 11.4 - 15.2 seconds   INR 0.92     Comment: Performed at [facility], [address] [place], [place] [address]  Type and screen [place]     Status: None (Preliminary result)   Collection Time: 05/30/17  7:11 PM  Result Value Ref Range   ABO/RH(D) PENDING    Antibody Screen PENDING    Sample Expiration      06/02/2017 Performed at  [facility], [address], [place], [place] [address]    Ct Abdomen Pelvis Wo Contrast  Result Date: 05/30/2017 CLINICAL DATA:  Abdominal discomfort.  Dark stools yesterday. EXAM: CT ABDOMEN AND PELVIS WITHOUT CONTRAST TECHNIQUE: Multidetector CT imaging of the abdomen and pelvis was performed following the standard protocol without IV contrast. COMPARISON:  CT abdomen pelvis dated August 28, 2016. FINDINGS: Lower chest: No acute abnormality. Hepatobiliary: Diffuse hepatic steatosis. The gallbladder is unremarkable. No biliary dilatation. Pancreas: Unremarkable. No pancreatic ductal dilatation or surrounding inflammatory changes. Spleen: Normal in size without focal abnormality. Adrenals/Urinary Tract: Adrenal glands are unremarkable. Kidneys are normal, without renal calculi, focal lesion, or hydronephrosis. Bladder is unremarkable. Stomach/Bowel: Stomach is within normal limits. Appendix appears normal. Previously seen small bowel wall thickening and inflammation has resolved. No bowel obstruction. Colonic diverticulosis. Vascular/Lymphatic: Aortic atherosclerosis. No enlarged abdominal or pelvic lymph nodes. Reproductive: Prostate is unremarkable. Other: No free fluid or pneumoperitoneum. Musculoskeletal: No acute or significant osseous findings. Stable severe degenerative changes of the lower lumbar spine IMPRESSION: 1.  No acute intra-abdominal process. 2. Hepatic steatosis, unchanged. 3.  Aortic atherosclerosis (ICD10-I70.0). Electronically Signed   By: Titus Dubin M.D.   On: 05/30/2017 19:27   Dg Chest 2 View  Result Date: 05/30/2017 CLINICAL DATA:  Shortness of breath.  Sharp chest pain EXAM: CHEST - 2 VIEW COMPARISON:  05/30/2017 FINDINGS: Cardiomediastinal silhouette is normal. Mediastinal contours appear intact. There is no evidence of focal airspace consolidation, pleural effusion or pneumothorax. Low lung volume. Osseous structures are without acute abnormality. Soft tissues are  grossly normal. IMPRESSION: No active cardiopulmonary disease. Electronically Signed   By: Fidela Salisbury M.D.   On: 05/30/2017 18:27    Pending Labs [employer] (From admission, onward)   Start     Ordered   Signed and Held  CBC  Tomorrow morning,   R     Signed and Held   Signed and Held  Basic metabolic panel  Tomorrow morning,   R     Signed and Held      Vitals/Pain Today's Vitals   05/30/17 1757 05/30/17 1803 05/30/17 1900 05/30/17 1929  BP:  (!) 83/48 127/72 113/62  Pulse:  (!) 118 (!) 102 (!) 104  Resp:  20  14  Temp:  99.1 F (37.3 C)    TempSrc:  Oral    SpO2:   97% 97%  Weight: 209 lb (94.8 kg)     Height: 5' 11"  (1.803 m)     PainSc: 5    5     Isolation Precautions No active isolations  Medications Medications  sodium chloride 0.9 % bolus 500 mL (500 mLs Intravenous New Bag/Given 05/30/17 1912)  pantoprazole (PROTONIX) injection 40 mg (40 mg Intravenous Given 05/30/17 1930)    Mobility walks

## 2017-05-30 NOTE — ED Provider Notes (Signed)
[facility] Emergency Department Provider Note  ____________________________________________   I have reviewed the triage vital signs and the nursing notes. Where available I have reviewed prior notes and, if possible and indicated, outside hospital notes.    HISTORY  Chief Complaint dizzy and Shortness of Breath    HPI George Gomez is a 57 y.o. male with diverticulitis, EtOH dependence daily drinker diabetes mellitus anemia, hypertension, but arthritis on disability, states that he had large amounts of black stool yesterday.  He denies any vomiting.  He also has epigastric abdominal discomfort which is chronic for him.  Patient also states that since his fall a week ago he has had pain that radiates into his neck on both sides but no numbness or weakness.  The patient states that he has had a chronic cough.  He has multiple complaints.  He denies fever or chills, he denies productive cough, he states the pain is worse when he changes position or touches his neck the wrong way since his fall.  He had a negative CT scan for trauma of his neck but he does have some spinal stenosis, but he does not complain of numbness or weakness.   Past Medical History:  Diagnosis Date  . Anemia   . Diabetes mellitus without complication (HCC)   . Diverticulitis    Pt states diverticulitis  . EtOH dependence (HCC)   . Hypercholesteremia   . Hypertension     Patient Active Problem List   Diagnosis Date Noted  . Acute renal failure (ARF) (HCC) 08/28/2016  . Acute on chronic renal failure (HCC) 03/20/2016  . Dehydration 03/20/2016  . Lactic acidosis 03/20/2016  . Hyponatremia 03/20/2016  . Alcohol abuse 03/20/2016  . Clostridium difficile diarrhea 03/18/2016    Past Surgical History:  Procedure Laterality Date  . none      Prior to Admission medications   Medication Sig Start Date End Date Taking? Authorizing Provider  allopurinol (ZYLOPRIM) 300 MG tablet Take 1  tablet by mouth daily. 02/21/16   [provider]  amLODipine (NORVASC) 10 MG tablet Take 10 mg by mouth.    [provider]  aspirin EC 81 MG tablet Take 81 mg by mouth.    [provider]  atorvastatin (LIPITOR) 40 MG tablet Take 1 tablet by mouth daily. 02/21/16   [provider]  Cyanocobalamin (VITAMIN B-12 IJ) Inject 1 Dose as directed every 30 (thirty) days.    [provider]  gabapentin (NEURONTIN) 300 MG capsule Take 300 mg by mouth 2 (two) times daily. 02/18/16   [provider]  metFORMIN (GLUCOPHAGE-XR) 500 MG 24 hr tablet Take 2 tablets by mouth daily. 03/05/16   [provider]  polyvinyl alcohol (LIQUIFILM TEARS) 1.4 % ophthalmic solution Place 1 drop into both eyes as needed for dry eyes. 03/20/16   Katharina Caper, MD  thiamine 250 MG tablet Take 1 tablet (250 mg total) by mouth daily. 03/21/16   Katharina Caper, MD    Allergies Patient has no known allergies.  Family History  Problem Relation Age of Onset  . Rheum arthritis Mother   . Diabetes Father   . Heart disease Father     Social History Social History   Tobacco Use  . Smoking status: Never Smoker  . Smokeless tobacco: Never Used  Substance Use Topics  . Alcohol use: Yes    Comment: 2-3 drinks/day  . Drug use: No    Review of Systems Constitutional: No fever/chills Eyes: No  visual changes. ENT: No sore throat. No stiff neck no neck pain Cardiovascular: Denies chest pain. Respiratory: Denies shortness of breath. Gastrointestinal:   no vomiting.  No diarrhea.  No constipation. Genitourinary: Negative for dysuria. Musculoskeletal: Negative lower extremity swelling Skin: Negative for rash. Neurological: Negative for severe headaches, focal weakness or numbness.   ____________________________________________   PHYSICAL EXAM:  VITAL SIGNS: ED Triage Vitals  Enc Vitals Group     BP 05/30/17 1803 (!) 83/48     Pulse Rate 05/30/17 1803 (!) 118      Resp 05/30/17 1803 20     Temp 05/30/17 1803 99.1 F (37.3 C)     Temp Source 05/30/17 1803 Oral     SpO2 --      Weight 05/30/17 1757 209 lb (94.8 kg)     Height 05/30/17 1757  (1.803 m)     Head Circumference --      Peak Flow --      Pain Score 05/30/17 1757 5     Pain Loc --      Pain Edu? --      Excl. in GC? --     Constitutional: Alert and oriented. Well appearing and in no acute distress. Eyes: Conjunctivae are normal Head: Atraumatic HEENT: No congestion/rhinnorhea. Mucous membranes are moist.  Oropharynx non-erythematous Neck: No midline tenderness but there is tenderness palpation in the trapezius muscles bilaterally which reproduces his discomfort. Cardiovascular: Normal rate, regular rhythm. Grossly normal heart sounds.  Good peripheral circulation. Respiratory: Normal respiratory effort.  No retractions. Lungs CTAB. Abdominal: Soft and mild epigastric discomfort. No distention. No guarding no rebound Back:  There is no focal tenderness or step off.  there is no midline tenderness there are no lesions noted. there is no CVA tenderness To exam, guaiac positive yellowish stool Musculoskeletal: No lower extremity tenderness, no upper extremity tenderness. No joint effusions, no DVT signs strong distal pulses no edema Neurologic:  Normal speech and language. No gross focal neurologic deficits are appreciated.  Skin:  Skin is warm, dry and intact. No rash noted. Psychiatric: Mood and affect are normal. Speech and behavior are normal.  ____________________________________________   LABS (all labs ordered are listed, but only abnormal results are displayed)  Labs Reviewed  CBC - Abnormal; Notable for the following components:      Result Value   RBC 4.17 (*)    Hemoglobin 12.7 (*)    HCT 38.4 (*)    RDW 15.9 (*)    All other components within normal limits  COMPREHENSIVE METABOLIC PANEL - Abnormal; Notable for the following components:   Sodium 133 (*)     Chloride 97 (*)    Glucose, Bld 155 (*)    BUN 28 (*)    Creatinine, Ser 1.84 (*)    Total Protein 8.3 (*)    AST 74 (*)    GFR calc non Af Amer 38 (*)    GFR calc Af Amer 45 (*)    All other components within normal limits  TROPONIN I  PROTIME-INR  LIPASE, BLOOD  TYPE AND SCREEN    Pertinent labs  results that were available during my care of the patient were reviewed by me and considered in my medical decision making (see chart for details). ____________________________________________  EKG  I personally interpreted any EKGs ordered by me or triage Sinus tach rate 112, no acute ST elevation or depression nonspecific ST changes ____________________________________________  RADIOLOGY  Pertinent labs & imaging results that were  available during my care of the patient were reviewed by me and considered in my medical decision making (see chart for details). If possible, patient and/or family made aware of any abnormal findings.  Dg Chest 2 View  Result Date: 05/30/2017 CLINICAL DATA:  Shortness of breath.  Sharp chest pain EXAM: CHEST - 2 VIEW COMPARISON:  05/30/2017 FINDINGS: Cardiomediastinal silhouette is normal. Mediastinal contours appear intact. There is no evidence of focal airspace consolidation, pleural effusion or pneumothorax. Low lung volume. Osseous structures are without acute abnormality. Soft tissues are grossly normal. IMPRESSION: No active cardiopulmonary disease. Electronically Signed   By: Ted Mcalpine M.D.   On: 05/30/2017 18:27   ____________________________________________    PROCEDURES  Procedure(s) performed: None  Procedures  Critical Care performed: CRITICAL CARE Performed by: Jeanmarie Plant   Total critical care time: 38 minutes  Critical care time was exclusive of separately billable procedures and treating other patients.  Critical care was necessary to treat or prevent imminent or life-threatening deterioration.  Critical care was  time spent personally by me on the following activities: development of treatment plan with patient and/or surrogate as well as nursing, discussions with consultants, evaluation of patient's response to treatment, examination of patient, obtaining history from patient or surrogate, ordering and performing treatments and interventions, ordering and review of laboratory studies, ordering and review of radiographic studies, pulse oximetry and re-evaluation of patient's condition.   ____________________________________________   INITIAL IMPRESSION / ASSESSMENT AND PLAN / ED COURSE  Pertinent labs & imaging results that were available during my care of the patient were reviewed by me and considered in my medical decision making (see chart for details).  Here for multiple complaints he has some reproducible pain in the muscles of his neck after a fall with a negative CT scan and normal neurologic exam.  No evidence of ligamentous laxity, has been going on for a week and he states he has had pain in his neck like this before even before the fall to his recollection.  He is on disability for arthritis.  More concerning is his blood pressure arrival was 83/48 is, but at this time it is in the 120s however he was also tachycardic he has been having black stool he is alcohol abuser and he is guaiac positive from below.  I feel from this reason alone he needs to be admitted.  Blood work is otherwise reassuring we are adding type and screen and coags.    ____________________________________________   FINAL CLINICAL IMPRESSION(S) / ED DIAGNOSES  Final diagnoses:  Gastrointestinal hemorrhage, unspecified gastrointestinal hemorrhage type      This chart was dictated using voice recognition software.  Despite best efforts to proofread,  errors can occur which can change meaning.      Jeanmarie Plant, MD 05/30/17 [phone]

## 2017-05-30 NOTE — H&P (Signed)
[facility] - [place] at [facility]   PATIENT NAME: George Gomez    MR#:  [id number]  DATE OF BIRTH:  [birth year]  DATE OF ADMISSION:  05/30/2017  PRIMARY CARE PHYSICIAN: [facility]   REQUESTING/REFERRING PHYSICIAN: D  CHIEF COMPLAINT:   Chief Complaint  Patient presents with  . dizzy  . Shortness of Breath    HISTORY OF PRESENT ILLNESS:  George Gomez  is a 57 y.o. male with a known history of diabetes, hypertension, hyperlipidemia, alcohol abuse who presents to the hospital due to dizziness, shortness of breath.  Patient also complains of nonspecific generalized neck pain shoulder pain from a recent fall he had about a week ago.  His pain has not improved he therefore came to the ER for further evaluation.  Upon further questioning by the ER physician he told him that the patient has been having some diarrhea which has been black in color.  He also has been complaining of some shortness of breath and dizziness.  Patient was mildly hypotensive in triage with systolic blood pressures in the 80s and has responded well to fluids.  Patient underwent a rectal exam done by the ER physician which was heme positive.  Hospitalist service therefore will contact for treatment evaluation.  Patient denies any other associated symptoms presently.  PAST MEDICAL HISTORY:   Past Medical History:  Diagnosis Date  . Anemia   . Diabetes mellitus without complication (HCC)   . Diverticulitis    Pt states diverticulitis  . EtOH dependence (HCC)   . Hypercholesteremia   . Hypertension     PAST SURGICAL HISTORY:   Past Surgical History:  Procedure Laterality Date  . none      SOCIAL HISTORY:   Social History   Tobacco Use  . Smoking status: Never Smoker  . Smokeless tobacco: Never Used  Substance Use Topics  . Alcohol use: Yes    Comment: 2-3 drinks/day    FAMILY HISTORY:   Family History  Problem Relation Age of Onset  . Rheum arthritis Mother    . Diabetes Father   . Heart disease Father     DRUG ALLERGIES:  No Known Allergies  REVIEW OF SYSTEMS:   Review of Systems  Constitutional: Negative for fever and weight loss.  HENT: Negative for congestion, nosebleeds and tinnitus.   Eyes: Negative for blurred vision, double vision and redness.  Respiratory: Positive for shortness of breath. Negative for cough and hemoptysis.   Cardiovascular: Negative for chest pain, orthopnea, leg swelling and PND.  Gastrointestinal: Positive for melena. Negative for abdominal pain, diarrhea, nausea and vomiting.  Genitourinary: Negative for dysuria, hematuria and urgency.  Musculoskeletal: Positive for back pain, myalgias and neck pain. Negative for falls and joint pain.  Neurological: Positive for dizziness. Negative for tingling, sensory change, focal weakness, seizures, weakness and headaches.  Endo/Heme/Allergies: Negative for polydipsia. Does not bruise/bleed easily.  Psychiatric/Behavioral: Negative for depression and memory loss. The patient is not nervous/anxious.     MEDICATIONS AT HOME:   Prior to Admission medications   Medication Sig Start Date End Date Taking? Authorizing Provider  allopurinol (ZYLOPRIM) 300 MG tablet Take 1 tablet by mouth daily. 02/21/16  Yes [provider]  aspirin EC 81 MG tablet Take 81 mg by mouth.   Yes [provider]  atorvastatin (LIPITOR) 40 MG tablet Take 1 tablet by mouth daily. 02/21/16  Yes [provider]  Cyanocobalamin (VITAMIN B-12 IJ) Inject 1 Dose as directed every 30 (  thirty) days.   Yes [provider]  gabapentin (NEURONTIN) 300 MG capsule Take 300 mg by mouth 2 (two) times daily. 02/18/16  Yes [provider]  lisinopril (PRINIVIL,ZESTRIL) 10 MG tablet Take 1 tablet by mouth daily. 05/19/17  Yes [provider]  metFORMIN (GLUCOPHAGE-XR) 500 MG 24 hr tablet Take 2 tablets by mouth daily. 03/05/16  Yes [provider]  polyvinyl  alcohol (LIQUIFILM TEARS) 1.4 % ophthalmic solution Place 1 drop into both eyes as needed for dry eyes. Patient not taking: Reported on 05/30/2017 03/20/16   Katharina Caper, MD  thiamine 250 MG tablet Take 1 tablet (250 mg total) by mouth daily. Patient not taking: Reported on 05/30/2017 03/21/16   Katharina Caper, MD      VITAL SIGNS:  Blood pressure 103/68, pulse (!) 106, temperature 99.1 F (37.3 C), temperature source Oral, resp. rate (!) 25, height  (1.803 m), weight 94.8 kg (209 lb), SpO2 96 %.  PHYSICAL EXAMINATION:  Physical Exam  GENERAL:  57 y.o.-year-old patient lying in the bed with no acute distress.  EYES: Pupils equal, round, reactive to light and accommodation. No scleral icterus. Extraocular muscles intact.  HEENT: Head atraumatic, normocephalic. Oropharynx and nasopharynx clear. No oropharyngeal erythema, moist oral mucosa  NECK:  Supple, no jugular venous distention. No thyroid enlargement, no tenderness.  LUNGS: Normal breath sounds bilaterally, no wheezing, rales, rhonchi. No use of accessory muscles of respiration.  CARDIOVASCULAR: S1, S2 RRR. No murmurs, rubs, gallops, clicks.  ABDOMEN: Soft, nontender, nondistended. Bowel sounds present. No organomegaly or mass.  EXTREMITIES: No pedal edema, cyanosis, or clubbing. + 2 pedal & radial pulses b/l.   NEUROLOGIC: Cranial nerves II through XII are intact. No focal Motor or sensory deficits appreciated b/l PSYCHIATRIC: The patient is alert and oriented x 3.  SKIN: No obvious rash, lesion, or ulcer.   LABORATORY PANEL:   CBC Recent Labs  Lab 05/30/17 1800  WBC 5.3  HGB 12.7*  HCT 38.4*  PLT 220   ------------------------------------------------------------------------------------------------------------------  Chemistries  Recent Labs  Lab 05/30/17 1800  NA 133*  K 4.2  CL 97*  CO2 23  GLUCOSE 155*  BUN 28*  CREATININE 1.84*  CALCIUM 9.6  AST 74*  ALT 51  ALKPHOS 79  BILITOT 1.0    ------------------------------------------------------------------------------------------------------------------  Cardiac Enzymes Recent Labs  Lab 05/30/17 1800  TROPONINI <0.03   ------------------------------------------------------------------------------------------------------------------  RADIOLOGY:  Ct Abdomen Pelvis Wo Contrast  Result Date: 05/30/2017 CLINICAL DATA:  Abdominal discomfort.  Dark stools yesterday. EXAM: CT ABDOMEN AND PELVIS WITHOUT CONTRAST TECHNIQUE: Multidetector CT imaging of the abdomen and pelvis was performed following the standard protocol without IV contrast. COMPARISON:  CT abdomen pelvis dated August 28, 2016. FINDINGS: Lower chest: No acute abnormality. Hepatobiliary: Diffuse hepatic steatosis. The gallbladder is unremarkable. No biliary dilatation. Pancreas: Unremarkable. No pancreatic ductal dilatation or surrounding inflammatory changes. Spleen: Normal in size without focal abnormality. Adrenals/Urinary Tract: Adrenal glands are unremarkable. Kidneys are normal, without renal calculi, focal lesion, or hydronephrosis. Bladder is unremarkable. Stomach/Bowel: Stomach is within normal limits. Appendix appears normal. Previously seen small bowel wall thickening and inflammation has resolved. No bowel obstruction. Colonic diverticulosis. Vascular/Lymphatic: Aortic atherosclerosis. No enlarged abdominal or pelvic lymph nodes. Reproductive: Prostate is unremarkable. Other: No free fluid or pneumoperitoneum. Musculoskeletal: No acute or significant osseous findings. Stable severe degenerative changes of the lower lumbar spine IMPRESSION: 1.  No acute intra-abdominal process. 2. Hepatic steatosis, unchanged. 3.  Aortic atherosclerosis (ICD10-I70.0). Electronically Signed   By: Chrissie Noa  Howell Pringle M.D.   On: 05/30/2017 19:27   Dg Chest 2 View  Result Date: 05/30/2017 CLINICAL DATA:  Shortness of breath.  Sharp chest pain EXAM: CHEST - 2 VIEW COMPARISON:  05/30/2017  FINDINGS: Cardiomediastinal silhouette is normal. Mediastinal contours appear intact. There is no evidence of focal airspace consolidation, pleural effusion or pneumothorax. Low lung volume. Osseous structures are without acute abnormality. Soft tissues are grossly normal. IMPRESSION: No active cardiopulmonary disease. Electronically Signed   By: Ted Mcalpine M.D.   On: 05/30/2017 18:27     IMPRESSION AND PLAN:   57 year old male with past medical history of hypertension, hyperlipidemia, alcohol abuse, diabetes who presents to the hospital due to shortness of breath, dizziness and noted to have black stools which were heme positive.  1.  GI bleed- this is a suspected upper GI bleed given the patient's black stools which were heme positive. -Patient does have a history of alcohol abuse.  Will hold aspirin, place him on a clear liquid diet.  Make him n.p.o. after midnight for possible endoscopy tomorrow. - Discussed the case with Dr. Tobi Bastos who will see the patient tomorrow.  Follow serial hemoglobins. -Continue p.o. Protonix.  2.  Diabetes type 2 without complication-hold metformin.  Placed on sliding scale insulin.  3.  Essential hypertension-hold lisinopril given the patient's low blood pressure.  Follow hemodynamics.  4.  History of gout-no acute attack.  Continue allopurinol.  5.  Hyperlipidemia-continue atorvastatin.  6.  Alcohol abuse-high risk for withdrawal.  Will place on CIWA protocol.  7.  Neuropathy-continue gabapentin.    All the records are reviewed and case discussed with ED provider. Management plans discussed with the patient, family and they are in agreement.  CODE STATUS: Full Code  TOTAL TIME TAKING CARE OF THIS PATIENT: 45 minutes.    Houston Siren M.D on 05/30/2017 at 7:49 PM  Between 7am to 6pm - Pager - [phone]  After 6pm go to [email or web address] - password [email or web address] [facility]  [facility]  Office  [phone]  CC: Primary care  physician; [facility] [employer]

## 2017-05-30 NOTE — ED Notes (Signed)
Patient transported to CT 

## 2017-05-30 NOTE — ED Triage Notes (Signed)
States has felt dizzy and SOB x 2 days. Denies chest pain. Slight headache. Smile, grips and leg strength equal.

## 2017-05-31 DIAGNOSIS — K921 Melena: Secondary | ICD-10-CM

## 2017-05-31 DIAGNOSIS — K2980 Duodenitis without bleeding: Secondary | ICD-10-CM | POA: Diagnosis not present

## 2017-05-31 DIAGNOSIS — K922 Gastrointestinal hemorrhage, unspecified: Secondary | ICD-10-CM | POA: Diagnosis not present

## 2017-05-31 LAB — GLUCOSE, CAPILLARY
GLUCOSE-CAPILLARY: 112 mg/dL — AB (ref 65–99)
GLUCOSE-CAPILLARY: 98 mg/dL (ref 65–99)
GLUCOSE-CAPILLARY: 100 mg/dL — AB (ref 65–99)
Glucose-Capillary: 149 mg/dL — ABNORMAL HIGH (ref 65–99)

## 2017-05-31 LAB — CBC
HCT: 34.3 % — ABNORMAL LOW (ref 40.0–52.0)
HEMOGLOBIN: 11.5 g/dL — AB (ref 13.0–18.0)
MCH: 31.1 pg (ref 26.0–34.0)
MCHC: 33.4 g/dL (ref 32.0–36.0)
MCV: 92.9 fL (ref 80.0–100.0)
PLATELETS: 173 10*3/uL (ref 150–440)
RBC: 3.69 MIL/uL — ABNORMAL LOW (ref 4.40–5.90)
RDW: 16.3 % — ABNORMAL HIGH (ref 11.5–14.5)
WBC: 3.8 10*3/uL (ref 3.8–10.6)

## 2017-05-31 LAB — BASIC METABOLIC PANEL
Anion gap: 12 (ref 5–15)
BUN: 28 mg/dL — AB (ref 6–20)
CO2: 23 mmol/L (ref 22–32)
Calcium: 8.5 mg/dL — ABNORMAL LOW (ref 8.9–10.3)
Chloride: 97 mmol/L — ABNORMAL LOW (ref 101–111)
Creatinine, Ser: 1.52 mg/dL — ABNORMAL HIGH (ref 0.61–1.24)
GFR, EST AFRICAN AMERICAN: 56 mL/min — AB (ref >60)
GFR, EST NON AFRICAN AMERICAN: 48 mL/min — AB (ref >60)
Glucose, Bld: 119 mg/dL — ABNORMAL HIGH (ref 65–99)
Potassium: 3.7 mmol/L (ref 3.5–5.1)
SODIUM: 132 mmol/L — AB (ref 135–145)

## 2017-05-31 SURGERY — ESOPHAGOGASTRODUODENOSCOPY (EGD) WITH PROPOFOL
Anesthesia: General

## 2017-05-31 MED ORDER — LORATADINE 10 MG PO TABS: 10.0000 mg | ORAL_TABLET | Freq: Every day | ORAL | Status: DC

## 2017-05-31 NOTE — Progress Notes (Signed)
[employer] - [place] at [facility]                                                                                                                                                                                  Patient Demographics   George Gomez, is a 57 y.o. male, DOB - 1960/03/09, ZOX:[id number]  Admit date - 05/30/2017   Admitting Physician Houston Siren, MD  Outpatient Primary MD for the patient is [facility]   LOS - 0  Subjective: Patient admitted with possible GI bleed.  He is states that he has not had any further bowel movements.  Abdominal pain is better.  Shortness of breath is improved.    Review of Systems:   CONSTITUTIONAL: No documented fever. No fatigue, weakness. No weight gain, no weight loss.  EYES: No blurry or double vision.  ENT: No tinnitus. No postnasal drip. No redness of the oropharynx.  RESPIRATORY: No cough, no wheeze, no hemoptysis.  Positive dyspnea.  CARDIOVASCULAR: No chest pain. No orthopnea. No palpitations. No syncope.  GASTROINTESTINAL: No nausea, no vomiting or diarrhea.  Improved abdominal pain. No melena or hematochezia.  GENITOURINARY: No dysuria or hematuria.  ENDOCRINE: No polyuria or nocturia. No heat or cold intolerance.  HEMATOLOGY: No anemia. No bruising. No bleeding.  INTEGUMENTARY: No rashes. No lesions.  MUSCULOSKELETAL: No arthritis. No swelling. No gout.  NEUROLOGIC: No numbness, tingling, or ataxia. No seizure-type activity.  PSYCHIATRIC: No anxiety. No insomnia. No ADD.    Vitals:   Vitals:   05/30/17 2023 05/31/17 0540 05/31/17 1048 05/31/17 1258  BP: 111/75 111/72 112/78   Pulse: 96 91 90   Resp: Temp: 98.6 F (37 C) 98.3 F (36.8 C) 98.1 F (36.7 C)   TempSrc: Oral Oral Oral   SpO2: 100% 98% 99%   Weight:    97.9 kg (215 lb 14.4 oz)  Height:        Wt Readings from Last 3 Encounters:  05/31/17 97.9 kg (215 lb 14.4 oz)  05/27/17 100.7 kg (222 lb)  11/04/16 101  kg (222 lb 9.6 oz)     Intake/Output Summary (Last 24 hours) at 05/31/2017 1326 Last data filed at 05/31/2017 1038 Gross per 24 hour  Intake 120 ml  Output 0 ml  Net 120 ml    Physical Exam:   GENERAL: Pleasant-appearing in no apparent distress.  HEAD, EYES, EARS, NOSE AND THROAT: Atraumatic, normocephalic. Extraocular muscles are intact. Pupils equal and reactive to light. Sclerae anicteric. No conjunctival injection. No oro-pharyngeal erythema.  NECK: Supple. There is no jugular venous distention. No bruits, no lymphadenopathy, no thyromegaly.  HEART: Regular  rate and rhythm,. No murmurs, no rubs, no clicks.  LUNGS: Clear to auscultation bilaterally. No rales or rhonchi. No wheezes.  ABDOMEN: Soft, flat, nontender, nondistended. Has good bowel sounds. No hepatosplenomegaly appreciated.  EXTREMITIES: No evidence of any cyanosis, clubbing, or peripheral edema.  +2 pedal and radial pulses bilaterally.  NEUROLOGIC: The patient is alert, awake, and oriented x3 with no focal motor or sensory deficits appreciated bilaterally.  SKIN: Moist and warm with no rashes appreciated.  Psych: Not anxious, depressed LN: No inguinal LN enlargement    Antibiotics   Anti-infectives (From admission, onward)   None      Medications   Scheduled Meds: . allopurinol  300 mg Oral Daily  . atorvastatin  40 mg Oral QPM  . folic acid  1 mg Oral Daily  . gabapentin  300 mg Oral BID  . insulin aspart  0-5 Units Subcutaneous QHS  . insulin aspart  0-9 Units Subcutaneous TID WC  . multivitamin with minerals  1 tablet Oral Daily  . pantoprazole  40 mg Oral BID  . thiamine  100 mg Oral Daily   Or  . thiamine  100 mg Intravenous Daily   Continuous Infusions: PRN Meds:.acetaminophen **OR** acetaminophen, LORazepam **OR** LORazepam, ondansetron **OR** ondansetron (ZOFRAN) IV   Data Review:   Micro Results No results found for this or any previous visit (from the past 240 hour(s)).  Radiology  Reports Ct Abdomen Pelvis Wo Contrast  Result Date: 05/30/2017 CLINICAL DATA:  Abdominal discomfort.  Dark stools yesterday. EXAM: CT ABDOMEN AND PELVIS WITHOUT CONTRAST TECHNIQUE: Multidetector CT imaging of the abdomen and pelvis was performed following the standard protocol without IV contrast. COMPARISON:  CT abdomen pelvis dated August 28, 2016. FINDINGS: Lower chest: No acute abnormality. Hepatobiliary: Diffuse hepatic steatosis. The gallbladder is unremarkable. No biliary dilatation. Pancreas: Unremarkable. No pancreatic ductal dilatation or surrounding inflammatory changes. Spleen: Normal in size without focal abnormality. Adrenals/Urinary Tract: Adrenal glands are unremarkable. Kidneys are normal, without renal calculi, focal lesion, or hydronephrosis. Bladder is unremarkable. Stomach/Bowel: Stomach is within normal limits. Appendix appears normal. Previously seen small bowel wall thickening and inflammation has resolved. No bowel obstruction. Colonic diverticulosis. Vascular/Lymphatic: Aortic atherosclerosis. No enlarged abdominal or pelvic lymph nodes. Reproductive: Prostate is unremarkable. Other: No free fluid or pneumoperitoneum. Musculoskeletal: No acute or significant osseous findings. Stable severe degenerative changes of the lower lumbar spine IMPRESSION: 1.  No acute intra-abdominal process. 2. Hepatic steatosis, unchanged. 3.  Aortic atherosclerosis (ICD10-I70.0). Electronically Signed   By: Obie Dredge M.D.   On: 05/30/2017 19:27   Dg Chest 2 View  Result Date: 05/30/2017 CLINICAL DATA:  Shortness of breath.  Sharp chest pain EXAM: CHEST - 2 VIEW COMPARISON:  05/30/2017 FINDINGS: Cardiomediastinal silhouette is normal. Mediastinal contours appear intact. There is no evidence of focal airspace consolidation, pleural effusion or pneumothorax. Low lung volume. Osseous structures are without acute abnormality. Soft tissues are grossly normal. IMPRESSION: No active cardiopulmonary disease.  Electronically Signed   By: Ted Mcalpine M.D.   On: 05/30/2017 18:27   Dg Chest 2 View  Result Date: 05/27/2017 CLINICAL DATA:  Chest pain for 2 weeks.  Fall today. EXAM: CHEST - 2 VIEW COMPARISON:  Chest radiograph August 28, 2016 FINDINGS: Cardiac silhouette is mildly enlarged, mediastinal silhouette is unremarkable for this low inspiratory examination with crowded vasculature markings. Similar bibasilar strandy densities. The lungs are clear without pleural effusions or focal consolidations. Trachea projects midline and there is no pneumothorax. Included soft tissue planes and  osseous structures are non-suspicious. Mild degenerative change of the thoracic spine. IMPRESSION: Mild cardiomegaly.  Bibasilar atelectasis/scarring. Electronically Signed   By: Awilda Metro M.D.   On: 05/27/2017 15:54   Ct Head Wo Contrast  Result Date: 05/27/2017 CLINICAL DATA:  Jumped off porch 4 days ago. Fell today. Extremity pain. History of alcohol dependence, hypertension, hypercholesterolemia. EXAM: CT HEAD WITHOUT CONTRAST CT CERVICAL SPINE WITHOUT CONTRAST TECHNIQUE: Multidetector CT imaging of the head and cervical spine was performed following the standard protocol without intravenous contrast. Multiplanar CT image reconstructions of the cervical spine were also generated. COMPARISON:  CT HEAD September 06, 2004 FINDINGS: CT HEAD FINDINGS BRAIN: No intraparenchymal hemorrhage, mass effect nor midline shift. Mild parenchymal brain volume loss. No acute large vascular territory infarcts. No abnormal extra-axial fluid collections. Basal cisterns are patent. VASCULAR: Mild calcific atherosclerosis of the carotid siphons. SKULL: No skull fracture. No significant scalp soft tissue swelling. SINUSES/ORBITS: Mild paranasal sinus mucosal thickening. Mastoid air cells are well aerated.The included ocular globes and orbital contents are non-suspicious. OTHER: None. CT CERVICAL SPINE FINDINGS ALIGNMENT: Straightened  lordosis.  Vertebral bodies in alignment. SKULL BASE AND VERTEBRAE: Cervical vertebral bodies and posterior elements are intact. Moderate to severe C4-5 through C6-7 disc height loss with endplate spurring compatible with degenerative discs. C1-2 articulation maintained with mild arthropathy. No destructive bony lesions. SOFT TISSUES AND SPINAL CANAL: Nonacute. Mild calcific atherosclerosis carotid bifurcations. DISC LEVELS: Mild canal stenosis C4-5, C5-6. Moderate to severe bilateral C3-4, severe RIGHT C4-5, moderate to severe C5-6 and C6-7 neural foraminal narrowing. UPPER CHEST: Lung apices are clear. OTHER: None. IMPRESSION: CT HEAD: 1. No acute intracranial process. 2. Mild parenchymal brain volume loss. CT CERVICAL SPINE: 1. No fracture or malalignment. 2. Mild canal stenosis C4-5 and C5-6. Multilevel advanced neural foraminal narrowing. Electronically Signed   By: Awilda Metro M.D.   On: 05/27/2017 16:05   Ct Cervical Spine Wo Contrast  Result Date: 05/27/2017 CLINICAL DATA:  Jumped off porch 4 days ago. Fell today. Extremity pain. History of alcohol dependence, hypertension, hypercholesterolemia. EXAM: CT HEAD WITHOUT CONTRAST CT CERVICAL SPINE WITHOUT CONTRAST TECHNIQUE: Multidetector CT imaging of the head and cervical spine was performed following the standard protocol without intravenous contrast. Multiplanar CT image reconstructions of the cervical spine were also generated. COMPARISON:  CT HEAD September 06, 2004 FINDINGS: CT HEAD FINDINGS BRAIN: No intraparenchymal hemorrhage, mass effect nor midline shift. Mild parenchymal brain volume loss. No acute large vascular territory infarcts. No abnormal extra-axial fluid collections. Basal cisterns are patent. VASCULAR: Mild calcific atherosclerosis of the carotid siphons. SKULL: No skull fracture. No significant scalp soft tissue swelling. SINUSES/ORBITS: Mild paranasal sinus mucosal thickening. Mastoid air cells are well aerated.The included ocular  globes and orbital contents are non-suspicious. OTHER: None. CT CERVICAL SPINE FINDINGS ALIGNMENT: Straightened lordosis.  Vertebral bodies in alignment. SKULL BASE AND VERTEBRAE: Cervical vertebral bodies and posterior elements are intact. Moderate to severe C4-5 through C6-7 disc height loss with endplate spurring compatible with degenerative discs. C1-2 articulation maintained with mild arthropathy. No destructive bony lesions. SOFT TISSUES AND SPINAL CANAL: Nonacute. Mild calcific atherosclerosis carotid bifurcations. DISC LEVELS: Mild canal stenosis C4-5, C5-6. Moderate to severe bilateral C3-4, severe RIGHT C4-5, moderate to severe C5-6 and C6-7 neural foraminal narrowing. UPPER CHEST: Lung apices are clear. OTHER: None. IMPRESSION: CT HEAD: 1. No acute intracranial process. 2. Mild parenchymal brain volume loss. CT CERVICAL SPINE: 1. No fracture or malalignment. 2. Mild canal stenosis C4-5 and C5-6. Multilevel advanced neural foraminal narrowing.  Electronically Signed   By: Awilda Metro M.D.   On: 05/27/2017 16:05     CBC Recent Labs  Lab 05/30/17 1800 05/31/17 0533  WBC 5.3 3.8  HGB 12.7* 11.5*  HCT 38.4* 34.3*  PLT 220 173  MCV 92.2 92.9  MCH 30.4 31.1  MCHC 33.0 33.4  RDW 15.9* 16.3*    Chemistries  Recent Labs  Lab 05/30/17 1800 05/31/17 0533  NA 133* 132*  K 4.2 3.7  CL 97* 97*  CO2 23 23  GLUCOSE 155* 119*  BUN 28* 28*  CREATININE 1.84* 1.52*  CALCIUM 9.6 8.5*  AST 74*  --   ALT 51  --   ALKPHOS 79  --   BILITOT 1.0  --    ------------------------------------------------------------------------------------------------------------------ estimated creatinine clearance is 62.4 mL/min (A) (by C-G formula based on SCr of 1.52 mg/dL (H)). ------------------------------------------------------------------------------------------------------------------ No results for input(s): HGBA1C in the last 72  hours. ------------------------------------------------------------------------------------------------------------------ No results for input(s): CHOL, HDL, LDLCALC, TRIG, CHOLHDL, LDLDIRECT in the last 72 hours. ------------------------------------------------------------------------------------------------------------------ No results for input(s): TSH, T4TOTAL, T3FREE, THYROIDAB in the last 72 hours.  Invalid input(s): FREET3 ------------------------------------------------------------------------------------------------------------------ No results for input(s): VITAMINB12, FOLATE, FERRITIN, TIBC, IRON, RETICCTPCT in the last 72 hours.  Coagulation profile Recent Labs  Lab 05/30/17 1911  INR 0.92    No results for input(s): DDIMER in the last 72 hours.  Cardiac Enzymes Recent Labs  Lab 05/30/17 1800  TROPONINI <0.03   ------------------------------------------------------------------------------------------------------------------ Invalid input(s): POCBNP    Assessment & Plan   57 year old male with past medical history of hypertension, hyperlipidemia, alcohol abuse, diabetes who presents to the hospital due to shortness of breath, dizziness and noted to have black stools which were heme positive.  1.  GI bleed- due to possible upper GI bleed.  I discussed with Dr. Tobi Bastos patient may have endoscopy later today - Continue Protonix Hemoglobin is stable 2.  Diabetes type 2 without complication-hold metformin.    Continue sliding scale insulin  3.  Essential hypertension-hold lisinopril given the patient's low blood pressure.  Follow hemodynamics.  4.  History of gout-no acute attack.  Continue allopurinol.  5.  Hyperlipidemia-continue atorvastatin.  6.  Alcohol abuse-high risk for withdrawal.  continue  on CIWA protocol.  7.  Neuropathy-continue gabapentin.       Code Status Orders  (From admission, onward)        Start     Ordered   05/30/17 2026   Full code  Continuous     05/30/17 2025    Code Status History    Date Active Date Inactive Code Status Order ID Comments User Context   08/28/2016 1309 08/29/2016 1823 Full Code [id number]  Houston Siren, MD Inpatient   03/18/2016 0427 03/20/2016 1353 Full Code [id number]  Ihor Austin, MD Inpatient           Consults gi  DVT Prophylaxis  scd  Lab Results  Component Value Date   PLT 173 05/31/2017     Time Spent in minutes   35 minutes  Greater than 50% of time spent in care coordination and counseling patient regarding the condition and plan of care.   Auburn Bilberry M.D on 05/31/2017 at 1:26 PM  Between 7am to 6pm - Pager - [phone]  After 6pm go to [email or web address] - Social research officer, government  [employer]   Office  [phone]

## 2017-05-31 NOTE — Care Management Obs Status (Signed)
MEDICARE OBSERVATION STATUS NOTIFICATION   Patient Details  Name: George Gomez MRN: [id number] Date of Birth: [birth year]   Medicare Observation Status Notification Given:  Yes    Chapman Fitch, RN 05/31/2017, 10:00 AM

## 2017-05-31 NOTE — Consult Note (Addendum)
George Gomez , MD [address], [address], [place], [place], [address] [address], [address], [place], [place], [address] Phone: [phone]  Fax: [phone]  Consultation  Referring Provider: Dr Allena Katz Primary Care Physician:  [facility] Primary Gastroenterologist:  Dr. Servando Snare          Reason for Consultation:     Melena   Date of Admission:  05/30/2017 Date of Consultation:  05/31/2017         HPI:   George Gomez is a 57 y.o. male was admitted on 05/31/2015 due to dizziness and shortness of breath.  In the ER he said that he been having some black-colored diarrhea.  He was hypotensive in the ER with the blood pressures in the 80s which responded to fluid he had a rectal exam done by the physician in the ER which was heme positive.  He was subsequently admitted.  He has a history of alcohol abuse.  On admission his hemoglobin was 12.7 g.  A BUN of 28 and a creatinine of 1.84.  He has seen Dr. Servando Snare in the past for postinfectious IBS.  His hemoglobin this morning is 11.5 which is at his baseline.  Denies any hematemesis. He took some Goody powder last week and had 2 days of black stool , last bowel movement this morning was brown . Denies any abdominal pain . Actively drinking alcohol.   Past Medical History:  Diagnosis Date  . Anemia   . Diabetes mellitus without complication (HCC)   . Diverticulitis    Pt states diverticulitis  . EtOH dependence (HCC)   . Hypercholesteremia   . Hypertension     Past Surgical History:  Procedure Laterality Date  . none      Prior to Admission medications   Medication Sig Start Date End Date Taking? Authorizing Provider  allopurinol (ZYLOPRIM) 300 MG tablet Take 1 tablet by mouth daily. 02/21/16  Yes [provider]  aspirin EC 81 MG tablet Take 81 mg by mouth.   Yes [provider]  atorvastatin (LIPITOR) 40 MG tablet Take 1 tablet by mouth daily. 02/21/16  Yes [provider]  Cyanocobalamin  (VITAMIN B-12 IJ) Inject 1 Dose as directed every 30 (thirty) days.   Yes [provider]  gabapentin (NEURONTIN) 300 MG capsule Take 300 mg by mouth 2 (two) times daily. 02/18/16  Yes [provider]  lisinopril (PRINIVIL,ZESTRIL) 10 MG tablet Take 1 tablet by mouth daily. 05/19/17  Yes [provider]  metFORMIN (GLUCOPHAGE-XR) 500 MG 24 hr tablet Take 2 tablets by mouth daily. 03/05/16  Yes [provider]  polyvinyl alcohol (LIQUIFILM TEARS) 1.4 % ophthalmic solution Place 1 drop into both eyes as needed for dry eyes. Patient not taking: Reported on 05/30/2017 03/20/16   Katharina Caper, MD  thiamine 250 MG tablet Take 1 tablet (250 mg total) by mouth daily. Patient not taking: Reported on 05/30/2017 03/21/16   Katharina Caper, MD    Family History  Problem Relation Age of Onset  . Rheum arthritis Mother   . Diabetes Father   . Heart disease Father      Social History   Tobacco Use  . Smoking status: Never Smoker  . Smokeless tobacco: Never Used  Substance Use Topics  . Alcohol use: Yes    Comment: 2-3 drinks/day  . Drug use: No    Allergies as of 05/30/2017  . (No Known Allergies)    Review of Systems:  All systems reviewed and negative except where noted in HPI.   Physical Exam:  Vital signs in last 24 hours: Temp:  [98.3 F (36.8 C)-99.1 F (37.3 C)] 98.3 F (36.8 C) (05/05 0540) Pulse Rate:  [91-118] 91 (05/05 0540) Resp:  [14-25] 18 (05/05 0540) BP: (83-127)/(48-75) 111/72 (05/05 0540) SpO2:  [96 %-100 %] 98 % (05/05 0540) Weight:  [209 lb (94.8 kg)] 209 lb (94.8 kg) (05/04 1757) Last BM Date: 05/31/17(per pt. report) General:   Pleasant, cooperative in NAD Head:  Normocephalic and atraumatic. Eyes:   No icterus.   Conjunctiva pink. PERRLA. Ears:  Normal auditory acuity. Neck:  Supple; no masses or thyroidomegaly Lungs: Respirations even and unlabored. Lungs clear to auscultation bilaterally.   No wheezes, crackles, or rhonchi.    Heart:  Regular rate and rhythm;  Without murmur, clicks, rubs or gallops Abdomen:  Soft, nondistended, nontender. Normal bowel sounds. No appreciable masses or hepatomegaly.  No rebound or guarding.  Neurologic:  Alert and oriented x3;  grossly normal neurologically. Skin:  Intact without significant lesions or rashes. Cervical Nodes:  No significant cervical adenopathy. Psych:  Alert and cooperative. Normal affect.  LAB RESULTS: Recent Labs    05/30/17 1800 05/31/17 0533  WBC 5.3 3.8  HGB 12.7* 11.5*  HCT 38.4* 34.3*  PLT 220 173   BMET Recent Labs    05/30/17 1800 05/31/17 0533  NA 133* 132*  K 4.2 3.7  CL 97* 97*  CO2 23 23  GLUCOSE 155* 119*  BUN 28* 28*  CREATININE 1.84* 1.52*  CALCIUM 9.6 8.5*   LFT Recent Labs    05/30/17 1800  PROT 8.3*  ALBUMIN 4.7  AST 74*  ALT 51  ALKPHOS 79  BILITOT 1.0   PT/INR Recent Labs    05/30/17 1911  LABPROT 12.3  INR 0.92    STUDIES: Ct Abdomen Pelvis Wo Contrast  Result Date: 05/30/2017 CLINICAL DATA:  Abdominal discomfort.  Dark stools yesterday. EXAM: CT ABDOMEN AND PELVIS WITHOUT CONTRAST TECHNIQUE: Multidetector CT imaging of the abdomen and pelvis was performed following the standard protocol without IV contrast. COMPARISON:  CT abdomen pelvis dated August 28, 2016. FINDINGS: Lower chest: No acute abnormality. Hepatobiliary: Diffuse hepatic steatosis. The gallbladder is unremarkable. No biliary dilatation. Pancreas: Unremarkable. No pancreatic ductal dilatation or surrounding inflammatory changes. Spleen: Normal in size without focal abnormality. Adrenals/Urinary Tract: Adrenal glands are unremarkable. Kidneys are normal, without renal calculi, focal lesion, or hydronephrosis. Bladder is unremarkable. Stomach/Bowel: Stomach is within normal limits. Appendix appears normal. Previously seen small bowel wall thickening and inflammation has resolved. No bowel obstruction. Colonic diverticulosis. Vascular/Lymphatic: Aortic  atherosclerosis. No enlarged abdominal or pelvic lymph nodes. Reproductive: Prostate is unremarkable. Other: No free fluid or pneumoperitoneum. Musculoskeletal: No acute or significant osseous findings. Stable severe degenerative changes of the lower lumbar spine IMPRESSION: 1.  No acute intra-abdominal process. 2. Hepatic steatosis, unchanged. 3.  Aortic atherosclerosis (ICD10-I70.0). Electronically Signed   By: Obie Dredge M.D.   On: 05/30/2017 19:27   Dg Chest 2 View  Result Date: 05/30/2017 CLINICAL DATA:  Shortness of breath.  Sharp chest pain EXAM: CHEST - 2 VIEW COMPARISON:  05/30/2017 FINDINGS: Cardiomediastinal silhouette is normal. Mediastinal contours appear intact. There is no evidence of focal airspace consolidation, pleural effusion or pneumothorax. Low lung volume. Osseous structures are without acute abnormality. Soft tissues are grossly normal. IMPRESSION: No active cardiopulmonary disease. Electronically Signed   By: Ted Mcalpine M.D.   On: 05/30/2017 18:27  Impression / Plan:   Lucio Litsey is a 57 y.o. y/o male admitted yesterday with dizziness shortness of breath, hypotension and some concern for black-colored stools.  His hemoglobin is at his baseline he has a history of alcohol abuse and hence I would suggest that we perform an upper endoscopy to make sure he is not bleeding from his upper GI tract.I intended to perform EGD today but per anesthesia no available slots- hence will be done on Monday . Continue PPI. No NSAID's and stop all alcohol.   I have discussed alternative options, risks & benefits,  which include, but are not limited to, bleeding, infection, perforation,respiratory complication & drug reaction.  The patient agrees with this plan & written consent will be obtained.     Thank you for involving me in the care of this patient.      LOS: 0 days   George Mood, MD  05/31/2017, 9:53 AM

## 2017-05-31 NOTE — Progress Notes (Signed)
Initial Nutrition Assessment  DOCUMENTATION CODES:   Obesity unspecified  INTERVENTION:  Once diet is able to be advanced recommend providing Premier Protein po BID, each supplement provides 160 kcal and 30 grams of protein.  Continue MVI daily, thiamine 017 mg daily, folic acid 1 mg daily in setting of hx of EtOH abuse.  NUTRITION DIAGNOSIS:   Inadequate oral intake related to decreased appetite, nausea, vomiting(abdominal pain) as evidenced by per patient/family report.  GOAL:   Patient will meet greater than or equal to 90% of their needs  MONITOR:   PO intake, Supplement acceptance, Diet advancement, Labs, Weight trends, I & O's  REASON FOR ASSESSMENT:   Malnutrition Screening Tool    ASSESSMENT:   57 year old male with PMHx of HTN, DM type 2, neuropathy,  anemia, hypercholesteremia, gout, hx of diverticulitis, EtOH abuse who is admitted with suspected GI bleed.   -Patient is currently NPO for possible EGD today, but according to chart it has not been scheduled yet.  Met with patient at bedside. He reports he has had a decreased appetite and intake for approximately 2 days now (started 1 day PTA). He reports nausea, abdominal pain, and melena. He typically has a good appetite and intake. He eats 2 meals per day if not working and 3 meals per day if working. Breakfast is usually eggs with toast or an egg biscuit. Lunch is usually a sandwich. Dinner is chicken or another meat with vegetables. No food allergies or intolerances.  Patient reports his UBW is around 219-220 lbs. He feels like he has been losing some weight recently. RD obtained bed scale weight of 215.9 lbs. Patient has lost approximately 4 lbs (1.9% body weight) over an unknown time frame.  Medications reviewed and include: allopurinol, folic acid 1 mg daily, Novolog 0-9 units TID, Novolog 0-5 units QHS, MVI daily, pantoprazole, thiamine 100 mg daily.  Labs reviewed: CBG 98-137, Sodium 132, Chloride 97, BUN 28,  Creatinine 1.52, eGFR 56. No HgbA1c available. Lipase was WNL yesterday.  Patient does not meet criteria for malnutrition at this time.  NUTRITION - FOCUSED PHYSICAL EXAM:    Most Recent Value  Orbital Region  No depletion  Upper Arm Region  No depletion  Thoracic and Lumbar Region  No depletion  Buccal Region  No depletion  Temple Region  No depletion  Clavicle Bone Region  No depletion  Clavicle and Acromion Bone Region  No depletion  Scapular Bone Region  No depletion  Dorsal Hand  No depletion  Patellar Region  No depletion  Anterior Thigh Region  No depletion  Posterior Calf Region  No depletion  Edema (RD Assessment)  None  Hair  Reviewed  Eyes  Reviewed  Mouth  Reviewed  Skin  Reviewed  Nails  Reviewed     Diet Order:   Diet Order           Diet NPO time specified Except for: Ice Chips, Sips with Meds  Diet effective now          EDUCATION NEEDS:   No education needs have been identified at this time  Skin:  Skin Assessment: Reviewed RN Assessment  Last BM:  05/31/2017 - medium type 2  Height:   Ht Readings from Last 1 Encounters:  05/30/17 _0  (1.803 m)    Weight:   Wt Readings from Last 1 Encounters:  05/31/17 215 lb 14.4 oz (97.9 kg)    Ideal Body Weight:  78.2 kg  BMI:  Body  mass index is 30.11 kg/m.  Estimated Nutritional Needs:   Kcal:  8502-7741 (MSJ x 1.2-1.3)  Protein:  100-115 grams (1-1.2 grams/kg)  Fluid:  2.2-2.4 L/day (1 mL/kcal)  Willey Blade, MS, RD, LDN Office: [phone] Pager: [phone] After Hours/Weekend Pager: [phone]

## 2017-06-01 ENCOUNTER — Encounter
Admission: EM | Disposition: A | Discharge status: Home / Self Care | Payer: Self-pay | Source: Home / Self Care | Attending: Emergency Medicine

## 2017-06-01 ENCOUNTER — Encounter: Payer: Self-pay | Admitting: *Deleted

## 2017-06-01 ENCOUNTER — Observation Stay: Payer: Medicare Other | Admitting: Anesthesiology

## 2017-06-01 DIAGNOSIS — K922 Gastrointestinal hemorrhage, unspecified: Secondary | ICD-10-CM | POA: Diagnosis not present

## 2017-06-01 DIAGNOSIS — K2980 Duodenitis without bleeding: Secondary | ICD-10-CM | POA: Diagnosis not present

## 2017-06-01 HISTORY — PX: ESOPHAGOGASTRODUODENOSCOPY (EGD) WITH PROPOFOL: SHX5813

## 2017-06-01 LAB — CBC
HEMATOCRIT: 35.5 % — AB (ref 40.0–52.0)
Hemoglobin: 11.7 g/dL — ABNORMAL LOW (ref 13.0–18.0)
MCH: 30.9 pg (ref 26.0–34.0)
MCHC: 33 g/dL (ref 32.0–36.0)
MCV: 93.7 fL (ref 80.0–100.0)
Platelets: 166 10*3/uL (ref 150–440)
RBC: 3.79 MIL/uL — ABNORMAL LOW (ref 4.40–5.90)
RDW: 15.3 % — AB (ref 11.5–14.5)
WBC: 3.6 10*3/uL — ABNORMAL LOW (ref 3.8–10.6)

## 2017-06-01 LAB — GLUCOSE, CAPILLARY
Glucose-Capillary: 129 mg/dL — ABNORMAL HIGH (ref 65–99)
Glucose-Capillary: 111 mg/dL — ABNORMAL HIGH (ref 65–99)

## 2017-06-01 SURGERY — ESOPHAGOGASTRODUODENOSCOPY (EGD) WITH PROPOFOL
Anesthesia: General

## 2017-06-01 MED ORDER — PROPOFOL 500 MG/50ML IV EMUL
INTRAVENOUS | Status: AC
  Filled 2017-06-01: qty 50

## 2017-06-01 MED ORDER — SODIUM CHLORIDE 0.9 % IV SOLN: INTRAVENOUS | Status: DC

## 2017-06-01 MED ORDER — MIDAZOLAM HCL 2 MG/2ML IJ SOLN
INTRAMUSCULAR | Status: AC
  Filled 2017-06-01: qty 2

## 2017-06-01 MED ORDER — SODIUM CHLORIDE 0.9 % IV SOLN
INTRAVENOUS | Status: DC
  Administered 2017-06-01: 1000 mL via INTRAVENOUS
  Administered 2017-06-01: 12:00:00 via INTRAVENOUS

## 2017-06-01 MED ORDER — PROPOFOL 500 MG/50ML IV EMUL
INTRAVENOUS | Status: DC | PRN
  Administered 2017-06-01: 140 ug/kg/min via INTRAVENOUS

## 2017-06-01 MED ORDER — IPRATROPIUM-ALBUTEROL 0.5-2.5 (3) MG/3ML IN SOLN
RESPIRATORY_TRACT | Status: AC
  Administered 2017-06-01: 3 mL via RESPIRATORY_TRACT
  Filled 2017-06-01: qty 3

## 2017-06-01 MED ORDER — MIDAZOLAM HCL 2 MG/2ML IJ SOLN
INTRAMUSCULAR | Status: DC | PRN
  Administered 2017-06-01: 2 mg via INTRAVENOUS

## 2017-06-01 MED ORDER — LORATADINE 10 MG PO TABS
10.0000 mg | ORAL_TABLET | Freq: Every day | ORAL | Status: DC | PRN
  Administered 2017-06-01: 10 mg via ORAL
  Filled 2017-06-01: qty 1

## 2017-06-01 MED ORDER — LIDOCAINE HCL (PF) 2 % IJ SOLN
INTRAMUSCULAR | Status: AC
  Filled 2017-06-01: qty 10

## 2017-06-01 MED ORDER — IPRATROPIUM-ALBUTEROL 0.5-2.5 (3) MG/3ML IN SOLN
3.0000 mL | Freq: Once | RESPIRATORY_TRACT | Status: AC
  Administered 2017-06-01: 3 mL via RESPIRATORY_TRACT

## 2017-06-01 MED ORDER — PANTOPRAZOLE SODIUM 40 MG PO TBEC: 40.0000 mg | DELAYED_RELEASE_TABLET | Freq: Every day | ORAL | 0 refills | Status: DC

## 2017-06-01 MED ORDER — FENTANYL CITRATE (PF) 100 MCG/2ML IJ SOLN
INTRAMUSCULAR | Status: DC | PRN
  Administered 2017-06-01: 50 ug via INTRAVENOUS

## 2017-06-01 MED ORDER — FENTANYL CITRATE (PF) 100 MCG/2ML IJ SOLN
INTRAMUSCULAR | Status: AC
  Filled 2017-06-01: qty 2

## 2017-06-01 MED ORDER — LIDOCAINE HCL (CARDIAC) PF 100 MG/5ML IV SOSY
PREFILLED_SYRINGE | INTRAVENOUS | Status: DC | PRN
  Administered 2017-06-01: 30 mg via INTRAVENOUS

## 2017-06-01 NOTE — H&P (Signed)
George Mood, MD [address], [place], [place], [address] [address], [place], [place], [address] Phone: [phone]  Fax: [phone]  Primary Care Physician:  [facility]   Pre-Procedure History & Physical: HPI:  George Gomez is a 57 y.o. male is here for an endoscopy    Past Medical History:  Diagnosis Date  . Anemia   . Diabetes mellitus without complication (HCC)   . Diverticulitis    Pt states diverticulitis  . EtOH dependence (HCC)   . Hypercholesteremia   . Hypertension     Past Surgical History:  Procedure Laterality Date  . none      Prior to Admission medications   Medication Sig Start Date End Date Taking? Authorizing Provider  allopurinol (ZYLOPRIM) 300 MG tablet Take 1 tablet by mouth daily. 02/21/16  Yes [provider]  aspirin EC 81 MG tablet Take 81 mg by mouth.   Yes [provider]  atorvastatin (LIPITOR) 40 MG tablet Take 1 tablet by mouth daily. 02/21/16  Yes [provider]  Cyanocobalamin (VITAMIN B-12 IJ) Inject 1 Dose as directed every 30 (thirty) days.   Yes [provider]  gabapentin (NEURONTIN) 300 MG capsule Take 300 mg by mouth 2 (two) times daily. 02/18/16  Yes [provider]  lisinopril (PRINIVIL,ZESTRIL) 10 MG tablet Take 1 tablet by mouth daily. 05/19/17  Yes [provider]  metFORMIN (GLUCOPHAGE-XR) 500 MG 24 hr tablet Take 2 tablets by mouth daily. 03/05/16  Yes [provider]  polyvinyl alcohol (LIQUIFILM TEARS) 1.4 % ophthalmic solution Place 1 drop into both eyes as needed for dry eyes. Patient not taking: Reported on 05/30/2017 03/20/16   Katharina Caper, MD  thiamine 250 MG tablet Take 1 tablet (250 mg total) by mouth daily. Patient not taking: Reported on 05/30/2017 03/21/16   Katharina Caper, MD    Allergies as of 05/30/2017  . (No Known Allergies)    Family History  Problem Relation Age of Onset  . Rheum arthritis Mother    . Diabetes Father   . Heart disease Father     Social History   Socioeconomic History  . Marital status: Married    Spouse name: Not on file  . Number of children: Not on file  . Years of education: Not on file  . Highest education level: Not on file  Occupational History  . Occupation: on disability  Social Needs  . Financial resource strain: Not on file  . Food insecurity:    Worry: Not on file    Inability: Not on file  . Transportation needs:    Medical: Not on file    Non-medical: Not on file  Tobacco Use  . Smoking status: Never Smoker  . Smokeless tobacco: Never Used  Substance and Sexual Activity  . Alcohol use: Yes    Comment: 2-3 drinks/day  . Drug use: No  . Sexual activity: Yes  Lifestyle  . Physical activity:    Days per week: Not on file    Minutes per session: Not on file  . Stress: Not on file  Relationships  . Social connections:    Talks on phone: Not on file    Gets together: Not on file    Attends religious service: Not on file    Active member of club or organization: Not on file    Attends meetings of clubs or organizations: Not on file    Relationship status: Not on  file  . Intimate partner violence:    Fear of current or ex partner: Not on file    Emotionally abused: Not on file    Physically abused: Not on file    Forced sexual activity: Not on file  Other Topics Concern  . Not on file  Social History Narrative  . Not on file    Review of Systems: See HPI, otherwise negative ROS  Physical Exam: BP 129/83 (BP Location: Right Arm)   Pulse 92   Temp 98.5 F (36.9 C) (Oral)   Resp 20   Ht  (1.803 m)   Wt 215 lb 14.4 oz (97.9 kg) Comment: bed scale  SpO2 97%   BMI 30.11 kg/m  General:   Alert,  pleasant and cooperative in NAD Head:  Normocephalic and atraumatic. Neck:  Supple; no masses or thyromegaly. Lungs:  Clear throughout to auscultation, normal respiratory effort.    Heart:  +S1, +S2, Regular rate and rhythm,  No edema. Abdomen:  Soft, nontender and nondistended. Normal bowel sounds, without guarding, and without rebound.   Neurologic:  Alert and  oriented x4;  grossly normal neurologically.  Impression/Plan: George Gomez is here for an endoscopy  to be performed for  evaluation of hematemesis    Risks, benefits, limitations, and alternatives regarding endoscopy have been reviewed with the patient.  Questions have been answered.  All parties agreeable.   George Mood, MD  06/01/2017, 12:19 PM

## 2017-06-01 NOTE — Progress Notes (Signed)
Discharge teaching given to patient, patient verbalized understanding and had no questions. Patient IV removed. Patient will be transported home by family. All patient belongings gathered prior to leaving.  

## 2017-06-01 NOTE — Op Note (Signed)
[facility] Patient Name: George Gomez Procedure Date: 06/01/2017 12:27 PM MRN: [id number] Account #: [id number] Date of Birth: 08-19-1960 Admit Type: Inpatient Age: 57 Room: [facility] ENDO ROOM 4 Gender: Male Note Status: Finalized Procedure:            Upper GI endoscopy Indications:          Hematemesis Providers:            Wyline Mood MD, MD Referring MD:         No Local Md, MD (Referring MD) Medicines:            Monitored Anesthesia Care Complications:        No immediate complications. Procedure:            Pre-Anesthesia Assessment:                       - Prior to the procedure, a History and Physical was                        performed, and patient medications, allergies and                        sensitivities were reviewed. The patient's tolerance of                        previous anesthesia was reviewed.                       - The risks and benefits of the procedure and the                        sedation options and risks were discussed with the                        patient. All questions were answered and informed                        consent was obtained.                       - After reviewing the risks and benefits, the patient                        was deemed in satisfactory condition to undergo the                        procedure.                       - ASA Grade Assessment: III - A patient with severe                        systemic disease.                       After obtaining informed consent, the endoscope was                        passed under direct vision. Throughout the procedure,                        the patient's  blood pressure, pulse, and oxygen                        saturations were monitored continuously. The Endoscope                        was introduced through the mouth, and advanced to the                        third part of duodenum. The upper GI endoscopy was                        accomplished  with ease. The patient tolerated the                        procedure well. Findings:      The esophagus was normal.      The stomach was normal.      Localized severe inflammation characterized by congestion (edema) and       erythema was found in the duodenal bulb. Biopsies were taken with a cold       forceps for histology.      The cardia and gastric fundus were normal on retroflexion. Impression:           - Normal esophagus.                       - Normal stomach.                       - Duodenitis. Biopsied. Recommendation:       - Await pathology results.                       - Return patient to hospital ward for ongoing care.                       - Advance diet as tolerated.                       - 1. Likely NSAID related duodenitis                       2. Continue Prilosec 40 mg for 6 weeks                       3. F/u as an outpatient                       4. I will sign out please call with questions Procedure Code(s):    --- Professional ---                       [phone], Esophagogastroduodenoscopy, flexible, transoral;                        with biopsy, single or multiple Diagnosis Code(s):    --- Professional ---                       K29.80, Duodenitis without bleeding                       K92.0, Hematemesis CPT copyright 2017  American Medical Association. All rights reserved. The codes documented in this report are preliminary and upon coder review may  be revised to meet current compliance requirements. Wyline Mood, MD Wyline Mood MD, MD 06/01/2017 12:41:02 PM This report has been signed electronically. Number of Addenda: 0 Note Initiated On: 06/01/2017 12:27 PM      [facility]

## 2017-06-01 NOTE — Progress Notes (Signed)
Inpatient Diabetes Program Recommendations  AACE/ADA: New Consensus Statement on Inpatient Glycemic Control (2015)  Target Ranges:  Prepandial:   less than 140 mg/dL      Peak postprandial:   less than 180 mg/dL (1-2 hours)      Critically ill patients:  140 - 180 mg/dL   Results for George Gomez, George Gomez (MRN [id number]) as of 06/01/2017 10:04  Ref. Range 05/31/2017 07:37 05/31/2017 11:34 05/31/2017 16:34 05/31/2017 21:40  Glucose-Capillary Latest Ref Range: 65 - 99 mg/dL 409 (H) 98 811 (H) 914 (H)   Results for George Gomez, George Gomez (MRN [id number]) as of 06/01/2017 10:04  Ref. Range 06/01/2017 07:31  Glucose-Capillary Latest Ref Range: 65 - 99 mg/dL 086 (H)    Admit: GIB  History: DM2, ETOH  Home DM Meds: Metformin 1000 mg daily  Current Orders: Novolog Sensitive Correction Scale/ SSI (0-9 units) TID AC + HS      Patient NPO for EGD today.  CBGs stable on current regimen.  Requiring very little Novolog SSI at this point.  Metformin on hold.  No recommendations at this point since CBGs are stable.     --Will follow patient during hospitalization--  Ambrose Finland RN, MSN, CDE Diabetes Coordinator Inpatient Glycemic Control Team Team Pager: [phone] (8a-5p)

## 2017-06-01 NOTE — Anesthesia Preprocedure Evaluation (Signed)
Anesthesia Evaluation  Patient identified by MRN, date of birth, ID band Patient awake    Reviewed: Allergy & Precautions, H&P , NPO status , Patient's Chart, lab work & pertinent test results  History of Anesthesia Complications Negative for: history of anesthetic complications  Airway Mallampati: III  TM Distance: >3 FB Neck ROM: limited    Dental  (+) Chipped, Poor Dentition   Pulmonary neg pulmonary ROS, neg shortness of breath,           Cardiovascular Exercise Tolerance: Good hypertension, (-) angina(-) Past MI and (-) DOE      Neuro/Psych PSYCHIATRIC DISORDERS negative neurological ROS     GI/Hepatic negative GI ROS, Neg liver ROS,   Endo/Other  diabetes, Type 2  Renal/GU CRFRenal disease  negative genitourinary   Musculoskeletal   Abdominal   Peds  Hematology negative hematology ROS (+)   Anesthesia Other Findings Past Medical History: No date: Anemia No date: Diabetes mellitus without complication (HCC) No date: Diverticulitis     Comment:  Pt states diverticulitis No date: EtOH dependence (HCC) No date: Hypercholesteremia No date: Hypertension  Past Surgical History: No date: none  BMI    Body Mass Index:  30.11 kg/m      Reproductive/Obstetrics negative OB ROS                             Anesthesia Physical Anesthesia Plan  ASA: III  Anesthesia Plan: General   Post-op Pain Management:    Induction: Intravenous  PONV Risk Score and Plan: Propofol infusion and TIVA  Airway Management Planned: Natural Airway and Nasal Cannula  Additional Equipment:   Intra-op Plan:   Post-operative Plan:   Informed Consent: I have reviewed the patients History and Physical, chart, labs and discussed the procedure including the risks, benefits and alternatives for the proposed anesthesia with the patient or authorized representative who has indicated his/her understanding  and acceptance.   Dental Advisory Given  Plan Discussed with: Anesthesiologist, CRNA and Surgeon  Anesthesia Plan Comments: (Patient consented for risks of anesthesia including but not limited to:  - adverse reactions to medications - risk of intubation if required - damage to teeth, lips or other oral mucosa - sore throat or hoarseness - Damage to heart, brain, lungs or loss of life  Patient voiced understanding.)        Anesthesia Quick Evaluation

## 2017-06-01 NOTE — Discharge Summary (Signed)
[facility] - [place] at [facility], 57 y.o., DOB 21-Jun-1960, MRN [id number]. Admission date: 05/30/2017 Discharge Date 06/01/2017 [facility] Admitting Physician Houston Siren, MD  Admission Diagnosis  Gastrointestinal hemorrhage, unspecified gastrointestinal hemorrhage type [K92.2]  Discharge Diagnosis   Active Problems: Upper GI bleed likely due to duodenitis Diabetes type II Essential hypertension History of gout Hyperlipidemia Alcohol abuse Neuropathy  Hospital Course  57 year old male with past medical history of hypertension, hyperlipidemia, alcohol abuse, diabetes who presents to the hospital due to shortness of breath, dizziness and noted to have black stools which were heme positive.  Patient was noted to be guaiac positive.  Therefore he was admitted for further evaluation.  Patient was placed on a Protonix drip.  He was seen by GI and underwent a EGD which showed duodenitis.  Patient's hemoglobin is stable.  No further bleeding noted.  Patient is stable to be discharged home.               Consults  GI  Significant Tests:  See full reports for all details     Ct Abdomen Pelvis Wo Contrast  Result Date: 05/30/2017 CLINICAL DATA:  Abdominal discomfort.  Dark stools yesterday. EXAM: CT ABDOMEN AND PELVIS WITHOUT CONTRAST TECHNIQUE: Multidetector CT imaging of the abdomen and pelvis was performed following the standard protocol without IV contrast. COMPARISON:  CT abdomen pelvis dated August 28, 2016. FINDINGS: Lower chest: No acute abnormality. Hepatobiliary: Diffuse hepatic steatosis. The gallbladder is unremarkable. No biliary dilatation. Pancreas: Unremarkable. No pancreatic ductal dilatation or surrounding inflammatory changes. Spleen: Normal in size without focal abnormality. Adrenals/Urinary Tract: Adrenal glands are unremarkable. Kidneys are normal, without renal calculi, focal lesion, or hydronephrosis.  Bladder is unremarkable. Stomach/Bowel: Stomach is within normal limits. Appendix appears normal. Previously seen small bowel wall thickening and inflammation has resolved. No bowel obstruction. Colonic diverticulosis. Vascular/Lymphatic: Aortic atherosclerosis. No enlarged abdominal or pelvic lymph nodes. Reproductive: Prostate is unremarkable. Other: No free fluid or pneumoperitoneum. Musculoskeletal: No acute or significant osseous findings. Stable severe degenerative changes of the lower lumbar spine IMPRESSION: 1.  No acute intra-abdominal process. 2. Hepatic steatosis, unchanged. 3.  Aortic atherosclerosis (ICD10-I70.0). Electronically Signed   By: Obie Dredge M.D.   On: 05/30/2017 19:27   Dg Chest 2 View  Result Date: 05/30/2017 CLINICAL DATA:  Shortness of breath.  Sharp chest pain EXAM: CHEST - 2 VIEW COMPARISON:  05/30/2017 FINDINGS: Cardiomediastinal silhouette is normal. Mediastinal contours appear intact. There is no evidence of focal airspace consolidation, pleural effusion or pneumothorax. Low lung volume. Osseous structures are without acute abnormality. Soft tissues are grossly normal. IMPRESSION: No active cardiopulmonary disease. Electronically Signed   By: Ted Mcalpine M.D.   On: 05/30/2017 18:27   Dg Chest 2 View  Result Date: 05/27/2017 CLINICAL DATA:  Chest pain for 2 weeks.  Fall today. EXAM: CHEST - 2 VIEW COMPARISON:  Chest radiograph August 28, 2016 FINDINGS: Cardiac silhouette is mildly enlarged, mediastinal silhouette is unremarkable for this low inspiratory examination with crowded vasculature markings. Similar bibasilar strandy densities. The lungs are clear without pleural effusions or focal consolidations. Trachea projects midline and there is no pneumothorax. Included soft tissue planes and osseous structures are non-suspicious. Mild degenerative change of the thoracic spine. IMPRESSION: Mild cardiomegaly.  Bibasilar atelectasis/scarring. Electronically Signed   By:  Awilda Metro M.D.   On: 05/27/2017 15:54   Ct Head Wo Contrast  Result Date: 05/27/2017 CLINICAL DATA:  Jumped off porch  4 days ago. Fell today. Extremity pain. History of alcohol dependence, hypertension, hypercholesterolemia. EXAM: CT HEAD WITHOUT CONTRAST CT CERVICAL SPINE WITHOUT CONTRAST TECHNIQUE: Multidetector CT imaging of the head and cervical spine was performed following the standard protocol without intravenous contrast. Multiplanar CT image reconstructions of the cervical spine were also generated. COMPARISON:  CT HEAD September 06, 2004 FINDINGS: CT HEAD FINDINGS BRAIN: No intraparenchymal hemorrhage, mass effect nor midline shift. Mild parenchymal brain volume loss. No acute large vascular territory infarcts. No abnormal extra-axial fluid collections. Basal cisterns are patent. VASCULAR: Mild calcific atherosclerosis of the carotid siphons. SKULL: No skull fracture. No significant scalp soft tissue swelling. SINUSES/ORBITS: Mild paranasal sinus mucosal thickening. Mastoid air cells are well aerated.The included ocular globes and orbital contents are non-suspicious. OTHER: None. CT CERVICAL SPINE FINDINGS ALIGNMENT: Straightened lordosis.  Vertebral bodies in alignment. SKULL BASE AND VERTEBRAE: Cervical vertebral bodies and posterior elements are intact. Moderate to severe C4-5 through C6-7 disc height loss with endplate spurring compatible with degenerative discs. C1-2 articulation maintained with mild arthropathy. No destructive bony lesions. SOFT TISSUES AND SPINAL CANAL: Nonacute. Mild calcific atherosclerosis carotid bifurcations. DISC LEVELS: Mild canal stenosis C4-5, C5-6. Moderate to severe bilateral C3-4, severe RIGHT C4-5, moderate to severe C5-6 and C6-7 neural foraminal narrowing. UPPER CHEST: Lung apices are clear. OTHER: None. IMPRESSION: CT HEAD: 1. No acute intracranial process. 2. Mild parenchymal brain volume loss. CT CERVICAL SPINE: 1. No fracture or malalignment. 2. Mild canal  stenosis C4-5 and C5-6. Multilevel advanced neural foraminal narrowing. Electronically Signed   By: Awilda Metro M.D.   On: 05/27/2017 16:05   Ct Cervical Spine Wo Contrast  Result Date: 05/27/2017 CLINICAL DATA:  Jumped off porch 4 days ago. Fell today. Extremity pain. History of alcohol dependence, hypertension, hypercholesterolemia. EXAM: CT HEAD WITHOUT CONTRAST CT CERVICAL SPINE WITHOUT CONTRAST TECHNIQUE: Multidetector CT imaging of the head and cervical spine was performed following the standard protocol without intravenous contrast. Multiplanar CT image reconstructions of the cervical spine were also generated. COMPARISON:  CT HEAD September 06, 2004 FINDINGS: CT HEAD FINDINGS BRAIN: No intraparenchymal hemorrhage, mass effect nor midline shift. Mild parenchymal brain volume loss. No acute large vascular territory infarcts. No abnormal extra-axial fluid collections. Basal cisterns are patent. VASCULAR: Mild calcific atherosclerosis of the carotid siphons. SKULL: No skull fracture. No significant scalp soft tissue swelling. SINUSES/ORBITS: Mild paranasal sinus mucosal thickening. Mastoid air cells are well aerated.The included ocular globes and orbital contents are non-suspicious. OTHER: None. CT CERVICAL SPINE FINDINGS ALIGNMENT: Straightened lordosis.  Vertebral bodies in alignment. SKULL BASE AND VERTEBRAE: Cervical vertebral bodies and posterior elements are intact. Moderate to severe C4-5 through C6-7 disc height loss with endplate spurring compatible with degenerative discs. C1-2 articulation maintained with mild arthropathy. No destructive bony lesions. SOFT TISSUES AND SPINAL CANAL: Nonacute. Mild calcific atherosclerosis carotid bifurcations. DISC LEVELS: Mild canal stenosis C4-5, C5-6. Moderate to severe bilateral C3-4, severe RIGHT C4-5, moderate to severe C5-6 and C6-7 neural foraminal narrowing. UPPER CHEST: Lung apices are clear. OTHER: None. IMPRESSION: CT HEAD: 1. No acute intracranial  process. 2. Mild parenchymal brain volume loss. CT CERVICAL SPINE: 1. No fracture or malalignment. 2. Mild canal stenosis C4-5 and C5-6. Multilevel advanced neural foraminal narrowing. Electronically Signed   By: Awilda Metro M.D.   On: 05/27/2017 16:05       Today   Subjective:   George Gomez patient feeling well denies any abdominal pain  Objective:   Blood pressure 132/89, pulse 99, temperature 98.6 F (  37 C), temperature source Oral, resp. rate 18, height  (1.803 m), weight 97.9 kg (215 lb 14.4 oz), SpO2 99 %.  .  Intake/Output Summary (Last 24 hours) at 06/01/2017 1547 Last data filed at 06/01/2017 1241 Gross per 24 hour  Intake 400 ml  Output 1500 ml  Net -1100 ml    Exam VITAL SIGNS: Blood pressure 132/89, pulse 99, temperature 98.6 F (37 C), temperature source Oral, resp. rate 18, height  (1.803 m), weight 97.9 kg (215 lb 14.4 oz), SpO2 99 %.  GENERAL:  57 y.o.-year-old patient lying in the bed with no acute distress.  EYES: Pupils equal, round, reactive to light and accommodation. No scleral icterus. Extraocular muscles intact.  HEENT: Head atraumatic, normocephalic. Oropharynx and nasopharynx clear.  NECK:  Supple, no jugular venous distention. No thyroid enlargement, no tenderness.  LUNGS: Normal breath sounds bilaterally, no wheezing, rales,rhonchi or crepitation. No use of accessory muscles of respiration.  CARDIOVASCULAR: S1, S2 normal. No murmurs, rubs, or gallops.  ABDOMEN: Soft, nontender, nondistended. Bowel sounds present. No organomegaly or mass.  EXTREMITIES: No pedal edema, cyanosis, or clubbing.  NEUROLOGIC: Cranial nerves II through XII are intact. Muscle strength 5/5 in all extremities. Sensation intact. Gait not checked.  PSYCHIATRIC: The patient is alert and oriented x 3.  SKIN: No obvious rash, lesion, or ulcer.   Data Review     CBC w Diff:  Lab Results  Component Value Date   WBC 3.6 (L) 06/01/2017   HGB 11.7 (L) 06/01/2017    HGB 12.5 (L) 09/19/2013   HCT 35.5 (L) 06/01/2017   HCT 38.3 (L) 09/19/2013   PLT 166 06/01/2017   PLT 174 09/19/2013   LYMPHOPCT 14 08/28/2016   LYMPHOPCT 21.8 08/02/2013   MONOPCT 9 08/28/2016   MONOPCT 11.6 08/02/2013   EOSPCT 0 08/28/2016   EOSPCT 2.3 08/02/2013   BASOPCT 1 08/28/2016   BASOPCT 1.0 08/02/2013   CMP:  Lab Results  Component Value Date   NA 132 (L) 05/31/2017   NA 141 09/19/2013   K 3.7 05/31/2017   K 3.4 (L) 09/19/2013   CL 97 (L) 05/31/2017   CL 107 09/19/2013   CO2 23 05/31/2017   CO2 22 09/19/2013   BUN 28 (H) 05/31/2017   BUN 18 09/19/2013   CREATININE 1.52 (H) 05/31/2017   CREATININE 1.25 09/19/2013   PROT 8.3 (H) 05/30/2017   PROT 8.1 09/19/2013   ALBUMIN 4.7 05/30/2017   ALBUMIN 4.1 09/19/2013   BILITOT 1.0 05/30/2017   BILITOT 0.6 09/19/2013   ALKPHOS 79 05/30/2017   ALKPHOS 85 09/19/2013   AST 74 (H) 05/30/2017   AST 50 (H) 09/19/2013   ALT 51 05/30/2017   ALT 32 09/19/2013  .  Micro Results No results found for this or any previous visit (from the past 240 hour(s)).      Code Status Orders  (From admission, onward)        Start     Ordered   05/30/17 2026  Full code  Continuous     05/30/17 2025    Code Status History    Date Active Date Inactive Code Status Order ID Comments User Context   08/28/2016 1309 08/29/2016 1823 Full Code [id number]  Houston Siren, MD Inpatient   03/18/2016 0427 03/20/2016 1353 Full Code [id number]  Ihor Austin, MD Inpatient          Follow-up Information    Inc, [facility]. Go on 06/04/2017.   Why:  Dr. Royetta Crochet, Thursday, May 9 at 9 a.m. [phone] Contact information: [address] [place] [address] [phone]           Discharge Medications   Allergies as of 06/01/2017   No Known Allergies     Medication List    STOP taking these medications   aspirin EC 81 MG tablet     TAKE these medications   allopurinol 300 MG tablet Commonly known as:   ZYLOPRIM Take 1 tablet by mouth daily.   atorvastatin 40 MG tablet Commonly known as:  LIPITOR Take 1 tablet by mouth daily.   gabapentin 300 MG capsule Commonly known as:  NEURONTIN Take 300 mg by mouth 2 (two) times daily.   lisinopril 10 MG tablet Commonly known as:  PRINIVIL,ZESTRIL Take 1 tablet by mouth daily.   metFORMIN 500 MG 24 hr tablet Commonly known as:  GLUCOPHAGE-XR Take 2 tablets by mouth daily.   pantoprazole 40 MG tablet Commonly known as:  PROTONIX Take 1 tablet (40 mg total) by mouth daily.   polyvinyl alcohol 1.4 % ophthalmic solution Commonly known as:  LIQUIFILM TEARS Place 1 drop into both eyes as needed for dry eyes.   thiamine 250 MG tablet Take 1 tablet (250 mg total) by mouth daily.   VITAMIN B-12 IJ Inject 1 Dose as directed every 30 (thirty) days.          Total Time in preparing paper work, data evaluation and todays exam - 35 minutes  Auburn Bilberry M.D on 06/01/2017 at 3:47 PM [facility]   Office  [phone]

## 2017-06-01 NOTE — Anesthesia Procedure Notes (Signed)
Performed by: Cook-Martin, Tameshia Bonneville Pre-anesthesia Checklist: Patient identified, Emergency Drugs available, Suction available, Patient being monitored and Timeout performed Patient Re-evaluated:Patient Re-evaluated prior to induction Oxygen Delivery Method: Nasal cannula Preoxygenation: Pre-oxygenation with 100% oxygen Induction Type: IV induction Airway Equipment and Method: Bite block Placement Confirmation: positive ETCO2 and CO2 detector       

## 2017-06-01 NOTE — Anesthesia Post-op Follow-up Note (Signed)
Anesthesia QCDR form completed.        

## 2017-06-01 NOTE — Transfer of Care (Signed)
Immediate Anesthesia Transfer of Care Note  Patient: George Gomez  Procedure(s) Performed: ESOPHAGOGASTRODUODENOSCOPY (EGD) WITH PROPOFOL (N/A )  Patient Location: PACU  Anesthesia Type:General  Level of Consciousness: awake and sedated  Airway & Oxygen Therapy: Patient Spontanous Breathing and Patient connected to nasal cannula oxygen  Post-op Assessment: Report given to RN and Post -op Vital signs reviewed and stable  Post vital signs: Reviewed and stable  Last Vitals:  Vitals Value Taken Time  BP    Temp    Pulse    Resp    SpO2      Last Pain:  Vitals:   06/01/17 0600  TempSrc:   PainSc: Asleep         Complications: No apparent anesthesia complications

## 2017-06-02 ENCOUNTER — Encounter: Payer: Self-pay | Admitting: Gastroenterology

## 2017-06-02 LAB — SURGICAL PATHOLOGY

## 2017-06-03 NOTE — Anesthesia Postprocedure Evaluation (Signed)
Anesthesia Post Note  Patient: George Gomez  Procedure(s) Performed: ESOPHAGOGASTRODUODENOSCOPY (EGD) WITH PROPOFOL (N/A )  Patient location during evaluation: Endoscopy Anesthesia Type: General Level of consciousness: awake and alert Pain management: pain level controlled Vital Signs Assessment: post-procedure vital signs reviewed and stable Respiratory status: spontaneous breathing, nonlabored ventilation and respiratory function stable Cardiovascular status: blood pressure returned to baseline and stable Postop Assessment: no apparent nausea or vomiting Anesthetic complications: no     Last Vitals:  Vitals:   06/01/17 1316 06/01/17 1357  BP: 125/86 132/89  Pulse: (!) 104 99  Resp: (!) 31 18  Temp:  37 C  SpO2: 98% 99%    Last Pain:  Vitals:   06/01/17 1357  TempSrc: Oral  PainSc:                  Christia Reading

## 2017-06-08 ENCOUNTER — Encounter: Payer: Self-pay | Admitting: Gastroenterology

## 2017-07-09 ENCOUNTER — Encounter: Payer: Self-pay | Admitting: Intensive Care

## 2017-07-09 ENCOUNTER — Emergency Department
Admission: EM | Admit: 2017-07-09 | Discharge: 2017-07-09 | Disposition: A | Discharge status: Home / Self Care | Payer: Medicare Other | Attending: Emergency Medicine | Admitting: Emergency Medicine

## 2017-07-09 DIAGNOSIS — T464X5A Adverse effect of angiotensin-converting-enzyme inhibitors, initial encounter: Secondary | ICD-10-CM | POA: Insufficient documentation

## 2017-07-09 DIAGNOSIS — R220 Localized swelling, mass and lump, head: Secondary | ICD-10-CM | POA: Insufficient documentation

## 2017-07-09 DIAGNOSIS — Z79899 Other long term (current) drug therapy: Secondary | ICD-10-CM | POA: Insufficient documentation

## 2017-07-09 DIAGNOSIS — T783XXA Angioneurotic edema, initial encounter: Secondary | ICD-10-CM

## 2017-07-09 DIAGNOSIS — F1010 Alcohol abuse, uncomplicated: Secondary | ICD-10-CM | POA: Insufficient documentation

## 2017-07-09 DIAGNOSIS — I10 Essential (primary) hypertension: Secondary | ICD-10-CM | POA: Insufficient documentation

## 2017-07-09 DIAGNOSIS — Z7984 Long term (current) use of oral hypoglycemic drugs: Secondary | ICD-10-CM | POA: Insufficient documentation

## 2017-07-09 DIAGNOSIS — E119 Type 2 diabetes mellitus without complications: Secondary | ICD-10-CM | POA: Diagnosis not present

## 2017-07-09 LAB — COMPREHENSIVE METABOLIC PANEL WITH GFR
ALT: 63 U/L (ref 17–63)
AST: 86 U/L — ABNORMAL HIGH (ref 15–41)
Albumin: 4.3 g/dL (ref 3.5–5.0)
Alkaline Phosphatase: 79 U/L (ref 38–126)
Anion gap: 14 (ref 5–15)
BUN: 13 mg/dL (ref 6–20)
CO2: 20 mmol/L — ABNORMAL LOW (ref 22–32)
Calcium: 9.6 mg/dL (ref 8.9–10.3)
Chloride: 102 mmol/L (ref 101–111)
Creatinine, Ser: 1.03 mg/dL (ref 0.61–1.24)
GFR calc Af Amer: >60 mL/min
GFR calc non Af Amer: >60 mL/min
Glucose, Bld: 129 mg/dL — ABNORMAL HIGH (ref 65–99)
Potassium: 3.4 mmol/L — ABNORMAL LOW (ref 3.5–5.1)
Sodium: 136 mmol/L (ref 135–145)
Total Bilirubin: 0.7 mg/dL (ref 0.3–1.2)
Total Protein: 8.1 g/dL (ref 6.5–8.1)

## 2017-07-09 LAB — CBC
HCT: 34.5 % — ABNORMAL LOW (ref 40.0–52.0)
Hemoglobin: 11.4 g/dL — ABNORMAL LOW (ref 13.0–18.0)
MCH: 30.7 pg (ref 26.0–34.0)
MCHC: 33 g/dL (ref 32.0–36.0)
MCV: 93.1 fL (ref 80.0–100.0)
Platelets: 153 K/uL (ref 150–440)
RBC: 3.7 MIL/uL — ABNORMAL LOW (ref 4.40–5.90)
RDW: 15.9 % — ABNORMAL HIGH (ref 11.5–14.5)
WBC: 3.3 K/uL — ABNORMAL LOW (ref 3.8–10.6)

## 2017-07-09 LAB — TROPONIN I: Troponin I: <0.03 ng/mL

## 2017-07-09 MED ORDER — SODIUM CHLORIDE 0.9 % IV BOLUS
1000.0000 mL | Freq: Once | INTRAVENOUS | Status: AC
  Administered 2017-07-09: 1000 mL via INTRAVENOUS

## 2017-07-09 MED ORDER — FAMOTIDINE IN NACL 20-0.9 MG/50ML-% IV SOLN
20.0000 mg | Freq: Once | INTRAVENOUS | Status: AC
  Administered 2017-07-09: 20 mg via INTRAVENOUS
  Filled 2017-07-09: qty 50

## 2017-07-09 MED ORDER — DIPHENHYDRAMINE HCL 50 MG/ML IJ SOLN
50.0000 mg | Freq: Once | INTRAMUSCULAR | Status: AC
  Administered 2017-07-09: 50 mg via INTRAVENOUS
  Filled 2017-07-09: qty 1

## 2017-07-09 MED ORDER — PREDNISONE 20 MG PO TABS: 40.0000 mg | ORAL_TABLET | Freq: Every day | ORAL | 0 refills | Status: DC

## 2017-07-09 MED ORDER — METHYLPREDNISOLONE SODIUM SUCC 125 MG IJ SOLR
125.0000 mg | Freq: Once | INTRAMUSCULAR | Status: AC
  Administered 2017-07-09: 125 mg via INTRAVENOUS
  Filled 2017-07-09: qty 2

## 2017-07-09 NOTE — ED Triage Notes (Signed)
Patient started experiencing facial swelling around 0930. Patient reports he feels alittle bit of trouble swallowing/breathing. Takes lisinopril currently. First time with facial swelling. No respiratory distress noted at this time

## 2017-07-09 NOTE — Discharge Instructions (Addendum)
Please take your steroids for the next 5 days as prescribed.  As we discussed please do not take lisinopril or any other medication that ends in "-pril" ever again.  Return to the emergency department immediately if you have any increase in swelling, any swelling in your tongue, throat or any difficulty breathing.

## 2017-07-09 NOTE — ED Provider Notes (Signed)
[facility] Emergency Department Provider Note  Time seen: 12:50 PM  I have reviewed the triage vital signs and the nursing notes.   HISTORY  Chief Complaint Facial Swelling    HPI George Gomez is a 57 y.o. male with a past medical history of anemia, diabetes, alcohol use, hypertension, hyperlipidemia, presents to the emergency department for facial swelling.  According to the patient around 9:00 this morning he felt like the right side of his upper lip was swelling.  States over the next several hours he continued to worsen.  States this is never happened before.  Denies any new foods or exposures today.  Patient does take lisinopril but states he has been taking this for years.  No history of allergic reactions, no known allergies.  Denies any difficulty breathing, does state very slight chest discomfort this morning but that has largely resolved per patient.  Denies any tongue swelling or throat swelling.   Past Medical History:  Diagnosis Date  . Anemia   . Diabetes mellitus without complication (HCC)   . Diverticulitis    Pt states diverticulitis  . EtOH dependence (HCC)   . Hypercholesteremia   . Hypertension     Patient Active Problem List   Diagnosis Date Noted  . GI bleed 05/30/2017  . Acute renal failure (ARF) (HCC) 08/28/2016  . Acute on chronic renal failure (HCC) 03/20/2016  . Dehydration 03/20/2016  . Lactic acidosis 03/20/2016  . Hyponatremia 03/20/2016  . Alcohol abuse 03/20/2016  . Clostridium difficile diarrhea 03/18/2016    Past Surgical History:  Procedure Laterality Date  . ESOPHAGOGASTRODUODENOSCOPY (EGD) WITH PROPOFOL N/A 06/01/2017   Procedure: ESOPHAGOGASTRODUODENOSCOPY (EGD) WITH PROPOFOL;  Surgeon: Wyline Mood, MD;  Location[facility] ENDOSCOPY;  Service: Gastroenterology;  Laterality: N/A;  . none      Prior to Admission medications   Medication Sig Start Date End Date Taking? Authorizing Provider  allopurinol (ZYLOPRIM)  300 MG tablet Take 1 tablet by mouth daily. 02/21/16   [provider]  atorvastatin (LIPITOR) 40 MG tablet Take 1 tablet by mouth daily. 02/21/16   [provider]  Cyanocobalamin (VITAMIN B-12 IJ) Inject 1 Dose as directed every 30 (thirty) days.    [provider]  gabapentin (NEURONTIN) 300 MG capsule Take 300 mg by mouth 2 (two) times daily. 02/18/16   [provider]  lisinopril (PRINIVIL,ZESTRIL) 10 MG tablet Take 1 tablet by mouth daily. 05/19/17   [provider]  metFORMIN (GLUCOPHAGE-XR) 500 MG 24 hr tablet Take 2 tablets by mouth daily. 03/05/16   [provider]  pantoprazole (PROTONIX) 40 MG tablet Take 1 tablet (40 mg total) by mouth daily. 06/01/17   Auburn Bilberry, MD  polyvinyl alcohol (LIQUIFILM TEARS) 1.4 % ophthalmic solution Place 1 drop into both eyes as needed for dry eyes. Patient not taking: Reported on 05/30/2017 03/20/16   Katharina Caper, MD  thiamine 250 MG tablet Take 1 tablet (250 mg total) by mouth daily. Patient not taking: Reported on 05/30/2017 03/21/16   Katharina Caper, MD    No Known Allergies  Family History  Problem Relation Age of Onset  . Rheum arthritis Mother   . Diabetes Father   . Heart disease Father     Social History Social History   Tobacco Use  . Smoking status: Never Smoker  . Smokeless tobacco: Never Used  Substance Use Topics  . Alcohol use: Yes    Comment: 2-3 drinks/day  . Drug use: No    Review  of Systems Constitutional: Negative for fever. Eyes: Negative for visual complaints ENT: Swelling of the upper lip Cardiovascular: Mild chest discomfort this morning, now resolved Respiratory: Negative for shortness of breath. Gastrointestinal: Negative for abdominal pain, vomiting Genitourinary: Negative for urinary compaints Musculoskeletal: Negative for musculoskeletal complaints Skin: Negative for skin complaints  Neurological: Negative for headache All other ROS  negative  ____________________________________________   PHYSICAL EXAM:  VITAL SIGNS: ED Triage Vitals  Enc Vitals Group     BP 07/09/17 1231 127/77     Pulse Rate 07/09/17 1231 100     Resp 07/09/17 1231 18     Temp 07/09/17 1231 98.7 F (37.1 C)     Temp Source 07/09/17 1231 Oral     SpO2 07/09/17 1231 96 %     Weight 07/09/17 1232 217 lb (98.4 kg)     Height 07/09/17 1232 5\' 7"  (1.702 m)     Head Circumference --      Peak Flow --      Pain Score 07/09/17 1232 0     Pain Loc --      Pain Edu? --      Excl. in GC? --    Constitutional: Alert and oriented. Well appearing and in no distress. Eyes: Normal exam ENT   Head: Atraumatic.   Mouth/Throat: Mucous membranes are moist.  Patient has moderate swelling of his upper lip right slightly greater than left.  No tongue swelling, no pharyngeal edema. Cardiovascular: Normal rate, regular rhythm. Respiratory: Normal respiratory effort without tachypnea nor retractions. Breath sounds are clear.  No wheeze. Gastrointestinal: Soft and nontender. No distention.  Musculoskeletal: Nontender with normal range of motion in all extremities. Neurologic:  Normal speech and language. No gross focal neurologic deficits Skin:  Skin is warm, dry and intact.  Psychiatric: Mood and affect are normal.   __________________________________________   INITIAL IMPRESSION / ASSESSMENT AND PLAN / ED COURSE  Pertinent labs & imaging results that were available during my care of the patient were reviewed by me and considered in my medical decision making (see chart for details).  Patient presents to the emergency department for upper lip swelling since 9:00 this morning.  No new foods or substances used today.  No allergies known.  Patient is on lisinopril, highly suspect angioedema.  We will treat with medications, IV hydrate, check labs and continue to closely monitor.  Patient is in no distress, no tongue or pharyngeal edema, no wheeze, no  hives.  Slight decrease in swelling noted.  Patient states it feels like it is considerably less swollen.  Definitely no increase in swelling since arrival.  We will discharge patient with prednisone.  I have instructed the patient not to take any lisinopril or any other medication and send Perlov her again.  Patient agreeable to plan.  ____________________________________________   FINAL CLINICAL IMPRESSION(S) / ED DIAGNOSES  ACE inhibitor induced angioedema    Minna AntisPaduchowski, Neyra Pettie, MD 07/09/17 1456

## 2017-07-20 ENCOUNTER — Encounter: Payer: Self-pay | Admitting: Physician Assistant

## 2017-09-01 ENCOUNTER — Ambulatory Visit (INDEPENDENT_AMBULATORY_CARE_PROVIDER_SITE_OTHER): Payer: Medicare Other | Admitting: Gastroenterology

## 2017-09-01 VITALS — BP 123/72 | HR 98 | Ht 71.0 in | Wt 214.6 lb

## 2017-09-01 DIAGNOSIS — F1010 Alcohol abuse, uncomplicated: Secondary | ICD-10-CM | POA: Diagnosis not present

## 2017-09-01 DIAGNOSIS — K2980 Duodenitis without bleeding: Secondary | ICD-10-CM | POA: Diagnosis not present

## 2017-09-01 NOTE — Progress Notes (Signed)
Wyline MoodKiran Dave Mannes MD, MRCP(U.K) [address]  [address]  [place], [place] [address]  Main: [phone]  Fax: [phone]   Primary Care Physician: [facility]  Primary Gastroenterologist:  Dr. Wyline MoodKiran Lenox Ladouceur   Chief Complaint  Patient presents with  . Hospitalization Follow-up    HPI: George Gomez is a 57 y.o. male   Summary of history :  He is here today to see me for a hospital follow up . I was consulted to see him when he was admitted on 05/30/17 with melena for 2 days . He had shortness of breath on admission.  He has a history of alcohol abuse.  On admission his hemoglobin was 12.7 g.  A BUN of 28 and a creatinine of 1.84.  He had seen by Dr. Servando SnareWohl in the past for postinfectious IBS.  His Hb on admission was 11.5 which is at his baseline.  Denies any hematemesis. He had taken some Goody powder the week prior to admission . LFT's were normal except for Ast 86   EGD performed on 06/01/17 : Duodenitis noted and felt likely secondary to NSAID use. Bx confirmed the same.    Interval history   05/31/2017-  09/01/2017   Feeling well, no black stool, no pains. No NSAID's. Still drinking alcohol. Has 3 drinks daily, has a drink in the morning - 1-2 hours after he wakes up . Has tremors if he misses the drink . Does not smoke.   Current Outpatient Medications  Medication Sig Dispense Refill  . allopurinol (ZYLOPRIM) 300 MG tablet Take 1 tablet by mouth daily.  3  . atorvastatin (LIPITOR) 40 MG tablet Take 1 tablet by mouth daily.  11  . Cyanocobalamin (VITAMIN B-12 IJ) Inject 1 Dose as directed every 30 (thirty) days.    Marland Kitchen. gabapentin (NEURONTIN) 300 MG capsule Take 300 mg by mouth 2 (two) times daily.  3  . lisinopril (PRINIVIL,ZESTRIL) 10 MG tablet Take 1 tablet by mouth daily.  11  . metFORMIN (GLUCOPHAGE-XR) 500 MG 24 hr tablet Take 2 tablets by mouth daily.    . pantoprazole (PROTONIX) 40 MG tablet Take 1 tablet (40 mg total) by mouth daily. 30 tablet 0  .  polyvinyl alcohol (LIQUIFILM TEARS) 1.4 % ophthalmic solution Place 1 drop into both eyes as needed for dry eyes. (Patient not taking: Reported on 05/30/2017) 15 mL 0  . predniSONE (DELTASONE) 20 MG tablet Take 2 tablets (40 mg total) by mouth daily. (Patient not taking: Reported on 09/01/2017) 10 tablet 0  . thiamine 250 MG tablet Take 1 tablet (250 mg total) by mouth daily. (Patient not taking: Reported on 05/30/2017) 30 tablet 0   No current facility-administered medications for this visit.     Allergies as of 09/01/2017  . (No Known Allergies)    ROS:  General: Negative for anorexia, weight loss, fever, chills, fatigue, weakness. ENT: Negative for hoarseness, difficulty swallowing , nasal congestion. CV: Negative for chest pain, angina, palpitations, dyspnea on exertion, peripheral edema.  Respiratory: Negative for dyspnea at rest, dyspnea on exertion, cough, sputum, wheezing.  GI: See history of present illness. GU:  Negative for dysuria, hematuria, urinary incontinence, urinary frequency, nocturnal urination.  Endo: Negative for unusual weight change.    Physical Examination:   BP 123/72   Pulse 98   Ht 5\' 11"  (1.803 m)   Wt 214 lb 9.6 oz (97.3 kg)   BMI 29.93 kg/m   General: Well-nourished, well-developed in no acute distress.  Eyes: No icterus. Conjunctivae pink. Mouth: Oropharyngeal mucosa moist and pink , no lesions erythema or exudate. Lungs: Clear to auscultation bilaterally. Non-labored. Heart: Regular rate and rhythm, no murmurs rubs or gallops.  Abdomen: Bowel sounds are normal, nontender, nondistended, no hepatosplenomegaly or masses, no abdominal bruits or hernia , no rebound or guarding.   Extremities: No lower extremity edema. No clubbing or deformities. Neuro: Alert and oriented x 3.  Grossly intact. Skin: Warm and dry, no jaundice.   Psych: Alert and cooperative, normal mood and affect.   Imaging Studies: No results found.  Assessment and Plan:   George Gomez is a 57 y.o. y/o male here to follow up to his recent admission in 05/2017 with melena and found to have NSAID related duodenitis. H/o Long term NSAID and alcohol use.   Plan  1. Stop all alcohol and NSAID use. Advised to join AA.  2. Check CBC,LFT,INR    Dr Wyline Mood  MD,MRCP [facility]) Follow up PRN

## 2017-09-03 ENCOUNTER — Encounter: Payer: Self-pay | Admitting: Gastroenterology

## 2017-09-03 ENCOUNTER — Other Ambulatory Visit
Admission: RE | Admit: 2017-09-03 | Discharge: 2017-09-03 | Disposition: A | Discharge status: Home / Self Care | Payer: Medicare Other | Source: Ambulatory Visit | Attending: Gastroenterology | Admitting: Gastroenterology

## 2017-09-03 DIAGNOSIS — K2980 Duodenitis without bleeding: Secondary | ICD-10-CM | POA: Diagnosis present

## 2017-09-03 LAB — CBC WITH DIFFERENTIAL/PLATELET
BASOS ABS: 0 10*3/uL (ref 0–0.1)
Basophils Relative: 1 %
EOS PCT: 2 %
Eosinophils Absolute: 0 10*3/uL (ref 0–0.7)
HCT: 35.4 % — ABNORMAL LOW (ref 40.0–52.0)
HEMOGLOBIN: 11.5 g/dL — AB (ref 13.0–18.0)
LYMPHS PCT: 34 %
Lymphs Abs: 0.7 10*3/uL — ABNORMAL LOW (ref 1.0–3.6)
MCH: 30.8 pg (ref 26.0–34.0)
MCHC: 32.6 g/dL (ref 32.0–36.0)
MCV: 94.5 fL (ref 80.0–100.0)
Monocytes Absolute: 0.3 10*3/uL (ref 0.2–1.0)
Monocytes Relative: 14 %
NEUTROS ABS: 1 10*3/uL — AB (ref 1.4–6.5)
NEUTROS PCT: 49 %
PLATELETS: 149 10*3/uL — AB (ref 150–440)
RBC: 3.75 MIL/uL — AB (ref 4.40–5.90)
RDW: 16.8 % — ABNORMAL HIGH (ref 11.5–14.5)
WBC: 2 10*3/uL — AB (ref 3.8–10.6)

## 2017-09-03 LAB — COMPREHENSIVE METABOLIC PANEL
ALT: 35 U/L (ref 0–44)
ANION GAP: 9 (ref 5–15)
AST: 47 U/L — AB (ref 15–41)
Albumin: 4.1 g/dL (ref 3.5–5.0)
Alkaline Phosphatase: 91 U/L (ref 38–126)
BUN: 19 mg/dL (ref 6–20)
CO2: 25 mmol/L (ref 22–32)
Calcium: 9 mg/dL (ref 8.9–10.3)
Chloride: 101 mmol/L (ref 98–111)
Creatinine, Ser: 1.17 mg/dL (ref 0.61–1.24)
Glucose, Bld: 208 mg/dL — ABNORMAL HIGH (ref 70–99)
POTASSIUM: 3.8 mmol/L (ref 3.5–5.1)
Sodium: 135 mmol/L (ref 135–145)
Total Bilirubin: 0.8 mg/dL (ref 0.3–1.2)
Total Protein: 7.7 g/dL (ref 6.5–8.1)

## 2017-09-03 LAB — PROTIME-INR
INR: 0.99
PROTHROMBIN TIME: 13 s (ref 11.4–15.2)

## 2017-12-03 ENCOUNTER — Encounter: Payer: Self-pay | Admitting: Hematology and Oncology

## 2017-12-03 ENCOUNTER — Inpatient Hospital Stay: Payer: Medicare Other

## 2017-12-03 ENCOUNTER — Encounter (INDEPENDENT_AMBULATORY_CARE_PROVIDER_SITE_OTHER): Payer: Self-pay

## 2017-12-03 ENCOUNTER — Inpatient Hospital Stay: Payer: Medicare Other | Attending: Hematology and Oncology | Admitting: Hematology and Oncology

## 2017-12-03 VITALS — BP 175/93 | HR 68 | Temp 97.8°F | Resp 18 | Ht 71.0 in | Wt 217.6 lb

## 2017-12-03 DIAGNOSIS — D649 Anemia, unspecified: Secondary | ICD-10-CM

## 2017-12-03 DIAGNOSIS — D72819 Decreased white blood cell count, unspecified: Secondary | ICD-10-CM

## 2017-12-03 DIAGNOSIS — D61818 Other pancytopenia: Secondary | ICD-10-CM | POA: Diagnosis not present

## 2017-12-03 DIAGNOSIS — E538 Deficiency of other specified B group vitamins: Secondary | ICD-10-CM | POA: Diagnosis not present

## 2017-12-03 DIAGNOSIS — E119 Type 2 diabetes mellitus without complications: Secondary | ICD-10-CM | POA: Diagnosis not present

## 2017-12-03 DIAGNOSIS — D72818 Other decreased white blood cell count: Secondary | ICD-10-CM

## 2017-12-03 DIAGNOSIS — I10 Essential (primary) hypertension: Secondary | ICD-10-CM | POA: Diagnosis not present

## 2017-12-03 DIAGNOSIS — Z79899 Other long term (current) drug therapy: Secondary | ICD-10-CM | POA: Insufficient documentation

## 2017-12-03 DIAGNOSIS — D696 Thrombocytopenia, unspecified: Secondary | ICD-10-CM

## 2017-12-03 DIAGNOSIS — D643 Other sideroblastic anemias: Secondary | ICD-10-CM | POA: Insufficient documentation

## 2017-12-03 LAB — COMPREHENSIVE METABOLIC PANEL
ALT: 26 U/L (ref 0–44)
AST: 28 U/L (ref 15–41)
Albumin: 4.1 g/dL (ref 3.5–5.0)
Alkaline Phosphatase: 86 U/L (ref 38–126)
Anion gap: 9 (ref 5–15)
BUN: 19 mg/dL (ref 6–20)
CHLORIDE: 106 mmol/L (ref 98–111)
CO2: 25 mmol/L (ref 22–32)
CREATININE: 1.05 mg/dL (ref 0.61–1.24)
Calcium: 9.4 mg/dL (ref 8.9–10.3)
Glucose, Bld: 110 mg/dL — ABNORMAL HIGH (ref 70–99)
POTASSIUM: 3.9 mmol/L (ref 3.5–5.1)
Sodium: 140 mmol/L (ref 135–145)
TOTAL PROTEIN: 7.7 g/dL (ref 6.5–8.1)
Total Bilirubin: 0.5 mg/dL (ref 0.3–1.2)

## 2017-12-03 LAB — IRON AND TIBC
Iron: 65 ug/dL (ref 45–182)
SATURATION RATIOS: 23 % (ref 17.9–39.5)
TIBC: 288 ug/dL (ref 250–450)
UIBC: 223 ug/dL

## 2017-12-03 LAB — CBC WITH DIFFERENTIAL/PLATELET
Abs Immature Granulocytes: 0.01 10*3/uL (ref 0.00–0.07)
BASOS ABS: 0 10*3/uL (ref 0.0–0.1)
BASOS PCT: 1 %
EOS PCT: 1 %
Eosinophils Absolute: 0 10*3/uL (ref 0.0–0.5)
HCT: 29.6 % — ABNORMAL LOW (ref 39.0–52.0)
Hemoglobin: 9.3 g/dL — ABNORMAL LOW (ref 13.0–17.0)
Immature Granulocytes: 0 %
Lymphocytes Relative: 36 %
Lymphs Abs: 0.9 10*3/uL (ref 0.7–4.0)
MCH: 30.7 pg (ref 26.0–34.0)
MCHC: 31.4 g/dL (ref 30.0–36.0)
MCV: 97.7 fL (ref 80.0–100.0)
Monocytes Absolute: 0.4 10*3/uL (ref 0.1–1.0)
Monocytes Relative: 14 %
NEUTROS ABS: 1.2 10*3/uL — AB (ref 1.7–7.7)
NRBC: 0 % (ref 0.0–0.2)
Neutrophils Relative %: 48 %
PLATELETS: 174 10*3/uL (ref 150–400)
RBC: 3.03 MIL/uL — AB (ref 4.22–5.81)
RDW: 15.9 % — ABNORMAL HIGH (ref 11.5–15.5)
WBC: 2.6 10*3/uL — ABNORMAL LOW (ref 4.0–10.5)

## 2017-12-03 LAB — RETICULOCYTES
Immature Retic Fract: 24.1 % — ABNORMAL HIGH (ref 2.3–15.9)
RBC.: 3.03 MIL/uL — ABNORMAL LOW (ref 4.22–5.81)
RETIC CT PCT: 2.2 % (ref 0.4–3.1)
Retic Count, Absolute: 65.8 10*3/uL (ref 19.0–186.0)

## 2017-12-03 LAB — VITAMIN B12: Vitamin B-12: 332 pg/mL (ref 180–914)

## 2017-12-03 LAB — TSH: TSH: 1.661 u[IU]/mL (ref 0.350–4.500)

## 2017-12-03 LAB — TECHNOLOGIST SMEAR REVIEW: TECH REVIEW: ADEQUATE

## 2017-12-03 LAB — FERRITIN: FERRITIN: 257 ng/mL (ref 24–336)

## 2017-12-03 LAB — FOLATE: Folate: 7.4 ng/mL (ref >5.9)

## 2017-12-03 NOTE — Progress Notes (Signed)
[facility] day:  12/03/2017  Chief Complaint: George Gomez is a 57 y.o. male with pancytopenia who is referred in consultation by Dr. Marcelino Duster for assessment and management.  HPI: The patient has a history of alcohol use, fatty liver, and vitamin B12 deficiency.  He has been on B12 supplementation for about 2 years. He was recently called due to his levels being > 2000, and advised to stop.  CBCs from 07/31/2013 - 09/03/2017 are available through [facility].  Hematocrit has ranged from 30.8 - 45.8 without trend.  Hemoglobin has ranged between 10.1 - 14.7. MCV has been normal.  Platelets have ranged between 111,000 - 190,000.  WBC has ranged between 2000 - 8300.  WBC has been consistently low since 06/01/2017.  Last available CBC on 09/03/2017 revealed a hematocrit of 35.4, hemoglobin 11.5, MCV 94.5, platelets 149,000, WBC 2000 with an [place] of 1000.  Differential included 49% segs, 34% lymphocytes, 14% monocytes, 2% eosinophils, and 1% basophils.  Notes indicate negative/normal peripheral smear, LDH, and SPEP/UPEP.  Abdomen and pelvic CT on 05/30/2017 revealed hepatic steatosis.  Spleen was normal.  He was admitted to [facility] from 05/30/2017 - 06/01/2017 with GI bleed.  EGD on 06/01/2017 by Dr. Vicente Males revealed a normal esophagus, normal stomach, and duodenitis.  Biopsy revealed reactive duodenitis without dysplasia or malignancy.  Symptomatically, he is doing well.  He notes that he feels generally well. Patient denies any fevers. He denies any interval infections. Patient has intermittent episodes of diaphoresis since the summer. He has not had any episodes in the last 1-2 months. Patient denies significant after bath pruritis.   Patient denies recurrent bleeding following his admission for GI bleeding; no hematochezia, melena, or gross hematuria. PMH (+) for diverticulitis. Patient denies ice pica. He has had restless legs for the last 2 years. Patient has  not started any new medications or herbal products.   Patient has diabetic neuropathy in his hands and feet. He is on daily gabapentin. He is on daily vitamin B1 for "the tremors". He drinks 2-3 liquor drinks a day. He formally drank more. Patient notes that he was self medicating his tremors with additional alcohol.  Since starting on B1 therapy, the tremors have improved, thus the patient is not drinking as much alcohol.   Patient complains of pain in his RIGHT knee and hip. He has issues with recurrent knee effusions. Patient has allergy symptoms. He has had dysuria x 2 months. Urine has been tested and found to be negative for infection.   Patient has a strong oncologic family history. His paternal uncles and father have had lung, throat, and multiple myeloma diagnoses.    Past Medical History:  Diagnosis Date  . Anemia   . Diabetes mellitus without complication ([place])   . Diverticulitis    Pt states diverticulitis  . EtOH dependence ([place])   . Hypercholesteremia   . Hypertension     Past Surgical History:  Procedure Laterality Date  . ESOPHAGOGASTRODUODENOSCOPY (EGD) WITH PROPOFOL N/A 06/01/2017   Procedure: ESOPHAGOGASTRODUODENOSCOPY (EGD) WITH PROPOFOL;  Surgeon: Jonathon Bellows, MD;  Location: [facility] ENDOSCOPY;  Service: Gastroenterology;  Laterality: N/A;  . none      Family History  Problem Relation Age of Onset  . Rheum arthritis Mother   . Diabetes Father   . Heart disease Father   . Cancer Father   . Cancer Maternal Grandfather   . Cancer Paternal Grandfather     Social History:  reports  that he has never smoked. He has never used smokeless tobacco. He reports that he drinks alcohol. He reports that he does not use drugs.  He drinks liquor 2-3 x/day.  He is drinking less than he did in the past.  He did "crack" 10-15 years ago.  He lives in [place].  The patient is accompanied by his sister today.  Allergies: No Known Allergies  Current Medications: Current Outpatient  Medications  Medication Sig Dispense Refill  . allopurinol (ZYLOPRIM) 300 MG tablet Take 1 tablet by mouth daily.  3  . atorvastatin (LIPITOR) 40 MG tablet Take 1 tablet by mouth daily.  11  . gabapentin (NEURONTIN) 300 MG capsule Take 300 mg by mouth 2 (two) times daily.  3  . lisinopril (PRINIVIL,ZESTRIL) 10 MG tablet Take 1 tablet by mouth daily.  11  . metFORMIN (GLUCOPHAGE-XR) 500 MG 24 hr tablet Take 2 tablets by mouth daily.    . polyvinyl alcohol (LIQUIFILM TEARS) 1.4 % ophthalmic solution Place 1 drop into both eyes as needed for dry eyes. 15 mL 0  . losartan (COZAAR) 50 MG tablet Take 50 mg by mouth daily.  3  . propranolol ER (INDERAL LA) 60 MG 24 hr capsule Take 60 mg by mouth at bedtime.  1   No current facility-administered medications for this visit.     Review of Systems:  GENERAL:  Tired "sometimes".  No fevers.  Sweats off/on (none in 1-2 months).  Weight loss of 10 pounds 2 months ago. PERFORMANCE STATUS (ECOG):  1 HEENT:  Runny nose.  No visual changes, sore throat, mouth sores or tenderness. Lungs: No shortness of breath or cough.  No hemoptysis. Cardiac:  No chest pain, palpitations, orthopnea, or PND. GI:  No nausea, vomiting, diarrhea, constipation, melena or hematochezia. GU:  Burning with urination x 2 months (cx negative).  No urgency, frequency, or hematuria. Musculoskeletal:  No back pain.  Right knee and hip pain.  No muscle tenderness. Extremities:  No pain or swelling. Skin:  Dry skin.  No after bath itching.  No rashes or skin changes. Neuro:  Restless legs x 2 years.  Diabetic neuropathy.  Shakes eased by thiamine.  No headache, numbness or weakness, balance or coordination issues. Endocrine:  Diabetes.  No thyroid issues, hot flashes or night sweats. Psych:  No mood changes, depression or anxiety. Pain:  No focal pain. Review of systems:  All other systems reviewed and found to be negative.  Physical Exam: Blood pressure (!) 175/93, pulse 68,  temperature 97.8 F (36.6 C), temperature source Tympanic, resp. rate 18, height '5\' 11"'$  (1.803 m), weight 217 lb 9 oz (98.7 kg), SpO2 99 %. GENERAL:  Well developed, well nourished, gentleman sitting comfortably in the exam room in no acute distress. MENTAL STATUS:  Alert and oriented to person, place and time. HEAD:  Wearing a cap.  Dark hair.  Graying goatee.  Normocephalic, atraumatic, face symmetric, no Cushingoid features. EYES: Brown eyes.  Pupils equal round and reactive to light and accomodation.  No conjunctivitis or scleral icterus. ENT:  Oropharynx clear without lesion.  Tongue normal. Mucous membranes moist.  RESPIRATORY:  Clear to auscultation without rales, wheezes or rhonchi. CARDIOVASCULAR:  Regular rate and rhythm without murmur, rub or gallop. ABDOMEN:  Soft, non-tender, with active bowel sounds, and no hepatosplenomegaly.  No masses. SKIN:  No rashes, ulcers or lesions. EXTREMITIES: No edema, no skin discoloration or tenderness.  No palpable cords. LYMPH NODES: No palpable cervical, supraclavicular, axillary or inguinal  adenopathy  NEUROLOGICAL: Hand tremors with extension.  Otherwise, grossly normal. PSYCH:  Appropriate.   No visits with results within 3 Day(s) from this visit.  Latest known visit with results is:  Hospital Outpatient Visit on 09/03/2017  Component Date Value Ref Range Status  . Prothrombin Time 09/03/2017 13.0  11.4 - 15.2 seconds Final  . INR 09/03/2017 0.99   Final   Performed at [facility], [address] [place], [place] [address]  . Sodium 09/03/2017 135  135 - 145 mmol/L Final  . Potassium 09/03/2017 3.8  3.5 - 5.1 mmol/L Final  . Chloride 09/03/2017 101  98 - 111 mmol/L Final  . CO2 09/03/2017 25  22 - 32 mmol/L Final  . Glucose, Bld 09/03/2017 208* 70 - 99 mg/dL Final  . BUN 09/03/2017 19  6 - 20 mg/dL Final  . Creatinine, Ser 09/03/2017 1.17  0.61 - 1.24 mg/dL Final  . Calcium 09/03/2017 9.0  8.9 - 10.3 mg/dL Final  . Total  Protein 09/03/2017 7.7  6.5 - 8.1 g/dL Final  . Albumin 09/03/2017 4.1  3.5 - 5.0 g/dL Final  . AST 09/03/2017 47* 15 - 41 U/L Final  . ALT 09/03/2017 35  0 - 44 U/L Final  . Alkaline Phosphatase 09/03/2017 91  38 - 126 U/L Final  . Total Bilirubin 09/03/2017 0.8  0.3 - 1.2 mg/dL Final  . GFR calc non Af Amer 09/03/2017 >60  >60 mL/min Final  . GFR calc Af Amer 09/03/2017 >60  >60 mL/min Final   Comment: (NOTE) The eGFR has been calculated using the CKD EPI equation. This calculation has not been validated in all clinical situations. eGFR's persistently <60 mL/min signify possible Chronic Kidney Disease.   Georgiann Hahn gap 09/03/2017 9  5 - 15 Final   Performed at [facility], [address] [place], [place] [address]  . WBC 09/03/2017 2.0* 3.8 - 10.6 K/uL Final  . RBC 09/03/2017 3.75* 4.40 - 5.90 MIL/uL Final  . Hemoglobin 09/03/2017 11.5* 13.0 - 18.0 g/dL Final  . HCT 09/03/2017 35.4* 40.0 - 52.0 % Final  . MCV 09/03/2017 94.5  80.0 - 100.0 fL Final  . MCH 09/03/2017 30.8  26.0 - 34.0 pg Final  . MCHC 09/03/2017 32.6  32.0 - 36.0 g/dL Final  . RDW 09/03/2017 16.8* 11.5 - 14.5 % Final  . Platelets 09/03/2017 149* 150 - 440 K/uL Final  . Neutrophils Relative % 09/03/2017 49  % Final  . Neutro Abs 09/03/2017 1.0* 1.4 - 6.5 K/uL Final  . Lymphocytes Relative 09/03/2017 34  % Final  . Lymphs Abs 09/03/2017 0.7* 1.0 - 3.6 K/uL Final  . Monocytes Relative 09/03/2017 14  % Final  . Monocytes Absolute 09/03/2017 0.3  0.2 - 1.0 K/uL Final  . Eosinophils Relative 09/03/2017 2  % Final  . Eosinophils Absolute 09/03/2017 0.0  0 - 0.7 K/uL Final  . Basophils Relative 09/03/2017 1  % Final  . Basophils Absolute 09/03/2017 0.0  0 - 0.1 K/uL Final   Performed at [facility], [address] [place], [place] [address]    Assessment:  George Gomez is a 57 y.o. male with mild pancytopenia.  CBCs from 07/31/2013 - 09/03/2017 reveal a hemoglobin from 10.1 - 14.7 without trend.  MCV has been normal.  Platelets have ranged between 111,000 - 190,000.  WBC has ranged between 2000 - 8300.  WBC has been consistently low since 06/01/2017.  He drinks alcohol daily.  CBC on  09/03/2017 revealed a hematocrit of 35.4, hemoglobin 11.5, MCV 94.5, platelets 149,000, WBC 2000 with an [place] of 1000.  Differential included 49% segs, 34% lymphocytes, 14% monocytes, 2% eosinophils, and 1% basophils.  Outside labs note negative/normal peripheral smear, LDH, and SPEP/UPEP.  Abdomen and pelvic CT on 05/30/2017 revealed hepatic steatosis.  Spleen was normal.  He has a history of a GI bleed on 05/30/2017. EGD on 06/01/2017 revealed a normal esophagus, normal stomach, and duodenitis.  Biopsy revealed reactive duodenitis without dysplasia or malignancy.  Symptomatically, he feels tired sometimes.  He lost 10 pounds 2 months ago.  He describes some sweats, but none in 1-2 months.  Exam reveals no adenopathy or hepatosplenomegaly.  Plan: 1.  Labs today:  CBC with diff, CMP, ferritin, iron studies, B12, folate, TSH, retic, copper, zinc, ANA, HBV sAg, HBV cAb, HCV Ab, HIV Ab 2.  Peripheral smear for review. 3.  Pancytopenia:  Discuss differential diagnosis: alcohol, nutritional deficiencies, medications, herbal products, viral infections (hepatitis, HIV), autoimmune. 4.  RTC in 1 week for MD assessment, review of work-up, and discussion regarding direction of therapy.    Honor Loh, NP  12/03/2017, 9:32 AM   I saw and evaluated the patient, participating in the key portions of the service and reviewing pertinent diagnostic studies and records.  I reviewed the nurse practitioner's note and agree with the findings and the plan.  The assessment and plan were discussed with the patient.  Several questions were asked by the patient and answered.   Nolon Stalls, MD 12/03/2017,9:32 AM

## 2017-12-03 NOTE — Progress Notes (Signed)
Patient here today as new evaluation regarding pancytopenia.  Referred by Dr. Orvan Falconer. Patient is accompanied by his sister today.

## 2017-12-04 LAB — ANA W/REFLEX: Anti Nuclear Antibody(ANA): NEGATIVE

## 2017-12-04 LAB — HEPATITIS C ANTIBODY: HCV AB: 0.1 {s_co_ratio} (ref 0.0–0.9)

## 2017-12-04 LAB — HIV ANTIBODY (ROUTINE TESTING W REFLEX): HIV Screen 4th Generation wRfx: NONREACTIVE

## 2017-12-04 LAB — COPPER, SERUM: COPPER: 132 ug/dL (ref 72–166)

## 2017-12-04 LAB — HEPATITIS B SURFACE ANTIGEN: HEP B S AG: NEGATIVE

## 2017-12-04 LAB — ZINC: Zinc: 71 ug/dL (ref 56–134)

## 2017-12-04 LAB — HEPATITIS B CORE ANTIBODY, TOTAL: Hep B Core Total Ab: NEGATIVE

## 2017-12-11 ENCOUNTER — Other Ambulatory Visit: Payer: Self-pay

## 2017-12-11 ENCOUNTER — Ambulatory Visit: Payer: Medicare Other

## 2017-12-11 ENCOUNTER — Inpatient Hospital Stay (HOSPITAL_BASED_OUTPATIENT_CLINIC_OR_DEPARTMENT_OTHER): Payer: Medicare Other | Admitting: Hematology and Oncology

## 2017-12-11 ENCOUNTER — Encounter: Payer: Self-pay | Admitting: Hematology and Oncology

## 2017-12-11 VITALS — BP 163/86 | HR 76 | Temp 97.8°F | Resp 18 | Wt 216.0 lb

## 2017-12-11 DIAGNOSIS — D649 Anemia, unspecified: Secondary | ICD-10-CM

## 2017-12-11 DIAGNOSIS — D72818 Other decreased white blood cell count: Secondary | ICD-10-CM

## 2017-12-11 DIAGNOSIS — D72819 Decreased white blood cell count, unspecified: Secondary | ICD-10-CM

## 2017-12-11 DIAGNOSIS — I10 Essential (primary) hypertension: Secondary | ICD-10-CM

## 2017-12-11 DIAGNOSIS — D696 Thrombocytopenia, unspecified: Secondary | ICD-10-CM | POA: Diagnosis not present

## 2017-12-11 DIAGNOSIS — E119 Type 2 diabetes mellitus without complications: Secondary | ICD-10-CM

## 2017-12-11 DIAGNOSIS — E538 Deficiency of other specified B group vitamins: Secondary | ICD-10-CM | POA: Diagnosis not present

## 2017-12-11 DIAGNOSIS — D61818 Other pancytopenia: Secondary | ICD-10-CM | POA: Diagnosis not present

## 2017-12-11 HISTORY — DX: Deficiency of other specified B group vitamins: E53.8

## 2017-12-11 MED ORDER — CYANOCOBALAMIN 1000 MCG/ML IJ SOLN
1000.0000 ug | Freq: Once | INTRAMUSCULAR | Status: AC
  Administered 2017-12-11: 1000 ug via INTRAMUSCULAR
  Filled 2017-12-11: qty 1

## 2017-12-11 NOTE — Progress Notes (Signed)
Here for follow up. Per pt " overall I feel ok "

## 2017-12-11 NOTE — Progress Notes (Signed)
[facility] day:  12/11/2017  Chief Complaint: George Gomez is a 57 y.o. male with pancytopenia who is seen for review of work-up and discussion regarding direction of therapy.  HPI:  The patient was last seen on the medical oncology office on 12/03/2017 for initial consultation.  He had a history of GI bleeding.  He drank alcohol daily.  Work-up revealed a hematocrit of 29.6, hemoglobin 9.3, MCV 97.7, WBC 2600 with an ANC of 1200.  Differential included 48% segs, 36% lymphs, 14% monocytes, 1% eosinophils, and 1% basophils.  CMP was normal.  Ferritin was 257.  Iron saturation was 23% with a TIBC of 288.  B12 was 332.  Folate was 7.4.  TSH was 1.661.  Retic was 2.2%.  Copper was 132.  Zinc was 71.  ANA was negative.  Hepatitis B core antibody, hepatitis B surface antigen, hepatitis C antibody, and HIV testing was negative.  Peripheral smear revealed normal RBC, WBC, and platelet morphology.  During the interim, he has felt "alright".    He notes ongoing issues with his knee.  He continues to drink alcohol (2 shots/day; 3-4 on the weekends).    He received  B12 shot last month as well as an influenza vaccine.  B12 was > 2000 on 11/20/2017.  He previously received B12 monthly for > 1 year.   Past Medical History:  Diagnosis Date  . Anemia   . Diabetes mellitus without complication ([place])   . Diverticulitis    Pt states diverticulitis  . EtOH dependence ([place])   . Hypercholesteremia   . Hypertension     Past Surgical History:  Procedure Laterality Date  . ESOPHAGOGASTRODUODENOSCOPY (EGD) WITH PROPOFOL N/A 06/01/2017   Procedure: ESOPHAGOGASTRODUODENOSCOPY (EGD) WITH PROPOFOL;  Surgeon: Jonathon Bellows, MD;  Location: [facility] ENDOSCOPY;  Service: Gastroenterology;  Laterality: N/A;  . none      Family History  Problem Relation Age of Onset  . Rheum arthritis Mother   . Diabetes Father   . Heart disease Father   . Cancer Father   . Cancer Maternal  Grandfather   . Cancer Paternal Grandfather     Social History:  reports that he has never smoked. He has never used smokeless tobacco. He reports current alcohol use. He reports that he does not use drugs.  He drinks liquor 2-3 x/day.  He is drinking less than he did in the past.  He did "crack" 10-15 years ago.  He lives in [place].  The patient is accompanied by his sister today.  Allergies: No Known Allergies  Current Medications: Current Outpatient Medications  Medication Sig Dispense Refill  . allopurinol (ZYLOPRIM) 300 MG tablet Take 1 tablet by mouth daily.  3  . atorvastatin (LIPITOR) 40 MG tablet Take 1 tablet by mouth daily.  11  . gabapentin (NEURONTIN) 300 MG capsule Take 300 mg by mouth 2 (two) times daily.  3  . lisinopril (PRINIVIL,ZESTRIL) 10 MG tablet Take 1 tablet by mouth daily.  11  . losartan (COZAAR) 50 MG tablet Take 50 mg by mouth daily.  3  . metFORMIN (GLUCOPHAGE-XR) 500 MG 24 hr tablet Take 2 tablets by mouth daily.    . polyvinyl alcohol (LIQUIFILM TEARS) 1.4 % ophthalmic solution Place 1 drop into both eyes as needed for dry eyes. 15 mL 0  . propranolol ER (INDERAL LA) 60 MG 24 hr capsule Take 60 mg by mouth at bedtime.  1   No current facility-administered medications  for this visit.     Review of Systems:  GENERAL:  Feels "alright".  No fevers, sweats.  Weight down 1 pound. PERFORMANCE STATUS (ECOG):  1 HEENT:  No visual changes, runny nose, sore throat, mouth sores or tenderness. Lungs: No shortness of breath or cough.  No hemoptysis. Cardiac:  No chest pain, palpitations, orthopnea, or PND. GI:  No nausea, vomiting, diarrhea, constipation, melena or hematochezia. GU:  No urgency, frequency, dysuria, or hematuria. Musculoskeletal:  No back pain.  Knee pain.  No muscle tenderness. Extremities:  No pain or swelling. Skin:  Dry skin.  No rashes or skin changes. Neuro:  Restless legs.  Diabetic neuropathy.  Shakes eased by thiamine.  No headache,  numbness or weakness, balance or coordination issues. Endocrine:  Diabetes.  No thyroid issues, hot flashes or night sweats. Psych:  No mood changes, depression or anxiety. Pain:  Knee pain (6 out of 10). Review of systems:  All other systems reviewed and found to be negative.   Physical Exam: Blood pressure (!) 163/86, pulse 76, temperature 97.8 F (36.6 C), temperature source Tympanic, resp. rate 18, weight 216 lb (98 kg). GENERAL:  Well developed, well nourished, gentleman sitting comfortably in the exam room in no acute distress. MENTAL STATUS:  Alert and oriented to person, place and time. HEAD:  Dark hair.  Graying goatee.  Normocephalic, atraumatic, face symmetric, no Cushingoid features. EYES:  Brown eyes.  No conjunctivitis or scleral icterus. NEUROLOGICAL: Unremarkable. PSYCH:  Appropriate.    No visits with results within 3 Day(s) from this visit.  Latest known visit with results is:  Appointment on 12/03/2017  Component Date Value Ref Range Status  . Zinc 12/03/2017 71  56 - 134 ug/dL Final   Comment: (NOTE) This test was developed and its performance characteristics determined by [facility]. It has not been cleared or approved by the Food and Drug Administration.                                Detection Limit = 5 Performed At: [facility] [place], [place] [id number] Rush Farmer MD FU:[id number]   . HIV Screen 4th Generation wRfx 12/03/2017 Non Reactive  Non Reactive Final   Comment: (NOTE) Performed At: [facility] [address] [place], [place] [id number] Rush Farmer MD BJ:[id number]   . HCV Ab 12/03/2017 0.1  0.0 - 0.9 s/co ratio Final   Comment: (NOTE)                                  Negative:     < 0.8                             Indeterminate: 0.8 - 0.9                                  Positive:     > 0.9 The CDC recommends that a positive HCV antibody result be followed up with a HCV Nucleic Acid Amplification test  (607371). Performed At: [facility] [place], [place] [id number] Rush Farmer MD OE:[id number]   . Hepatitis B Surface Ag 12/03/2017 Negative  Negative Final   Comment: (NOTE) Performed At: [facility] [place],  [place] [id number] Rush Farmer MD AO:[id number]   . Hep B Core Total Ab 12/03/2017 Negative  Negative Final   Comment: (NOTE) Performed At: [facility] [place], [place] [id number] Rush Farmer MD LK:[id number]   . Anti Nuclear Antibody(ANA) 12/03/2017 Negative  Negative Final   Comment: (NOTE) Performed At: [facility] [place], [place] [id number] Rush Farmer MD QV:[id number]   . Copper 12/03/2017 132  72 - 166 ug/dL Final   Comment: (NOTE) This test was developed and its performance characteristics determined by [facility]. It has not been cleared or approved by the Food and Drug Administration.                                Detection Limit = 5 Performed At: [facility] [place], [place] [id number] Rush Farmer MD YS:[id number]   . Retic Ct Pct 12/03/2017 2.2  0.4 - 3.1 % Final  . RBC. 12/03/2017 3.03* 4.22 - 5.81 MIL/uL Final  . Retic Count, Absolute 12/03/2017 65.8  19.0 - 186.0 K/uL Final  . Immature Retic Fract 12/03/2017 24.1* 2.3 - 15.9 % Final   Performed at [facility], [address] [place], [place] [address]  . TSH 12/03/2017 1.661  0.350 - 4.500 uIU/mL Final   Comment: Performed by a 3rd Generation assay with a functional sensitivity of <=0.01 uIU/mL. Performed at [facility], [address], [place], [place] [address]   . Folate 12/03/2017 7.4  >5.9 ng/mL Final   Performed at [facility], [address] [place], [place] [address]  . Vitamin B-12 12/03/2017 332  180 - 914 pg/mL Final   Comment: (NOTE) This assay is not validated for testing neonatal or myeloproliferative syndrome specimens for  Vitamin B12 levels. Performed at [facility], [place] [address] [place], [place] [address]   . Iron 12/03/2017 65  45 - 182 ug/dL Final  . TIBC 12/03/2017 288  250 - 450 ug/dL Final  . Saturation Ratios 12/03/2017 23  17.9 - 39.5 % Final  . UIBC 12/03/2017 223  ug/dL Final   Performed at [facility], [address], [place], [place] [address]  . Ferritin 12/03/2017 257  24 - 336 ng/mL Final   Performed at [facility], [place] [place],  [address]  . Sodium 12/03/2017 140  135 - 145 mmol/L Final  . Potassium 12/03/2017 3.9  3.5 - 5.1 mmol/L Final  . Chloride 12/03/2017 106  98 - 111 mmol/L Final  . CO2 12/03/2017 25  22 - 32 mmol/L Final  . Glucose, Bld 12/03/2017 110* 70 - 99 mg/dL Final  . BUN 12/03/2017 19  6 - 20 mg/dL Final  . Creatinine, Ser 12/03/2017 1.05  0.61 - 1.24 mg/dL Final  . Calcium 12/03/2017 9.4  8.9 - 10.3 mg/dL Final  . Total Protein 12/03/2017 7.7  6.5 - 8.1 g/dL Final  . Albumin 12/03/2017 4.1  3.5 - 5.0 g/dL Final  . AST 12/03/2017 28  15 - 41 U/L Final  . ALT 12/03/2017 26  0 - 44 U/L Final  . Alkaline Phosphatase 12/03/2017 86  38 - 126 U/L Final  . Total Bilirubin 12/03/2017 0.5  0.3 - 1.2 mg/dL Final  . GFR calc non Af Amer 12/03/2017 >60  >60 mL/min Final  . GFR calc Af Amer 12/03/2017 >60  >60 mL/min Final   Comment: (NOTE) The  eGFR has been calculated using the CKD EPI equation. This calculation has not been validated in all clinical situations. eGFR's persistently <60 mL/min signify possible Chronic Kidney Disease.   Georgiann Hahn gap 12/03/2017 9  5 - 15 Final   Performed at [facility], [address] [place], [place] [address]  . WBC 12/03/2017 2.6* 4.0 - 10.5 K/uL Final  . RBC 12/03/2017 3.03* 4.22 - 5.81 MIL/uL Final  . Hemoglobin 12/03/2017 9.3* 13.0 - 17.0 g/dL Final  . HCT 12/03/2017 29.6* 39.0 - 52.0 % Final  . MCV 12/03/2017 97.7  80.0 - 100.0 fL Final  . MCH 12/03/2017 30.7  26.0 - 34.0 pg  Final  . MCHC 12/03/2017 31.4  30.0 - 36.0 g/dL Final  . RDW 12/03/2017 15.9* 11.5 - 15.5 % Final  . Platelets 12/03/2017 174  150 - 400 K/uL Final  . nRBC 12/03/2017 0.0  0.0 - 0.2 % Final  . Neutrophils Relative % 12/03/2017 48  % Final  . Neutro Abs 12/03/2017 1.2* 1.7 - 7.7 K/uL Final  . Lymphocytes Relative 12/03/2017 36  % Final  . Lymphs Abs 12/03/2017 0.9  0.7 - 4.0 K/uL Final  . Monocytes Relative 12/03/2017 14  % Final  . Monocytes Absolute 12/03/2017 0.4  0.1 - 1.0 K/uL Final  . Eosinophils Relative 12/03/2017 1  % Final  . Eosinophils Absolute 12/03/2017 0.0  0.0 - 0.5 K/uL Final  . Basophils Relative 12/03/2017 1  % Final  . Basophils Absolute 12/03/2017 0.0  0.0 - 0.1 K/uL Final  . Immature Granulocytes 12/03/2017 0  % Final  . Abs Immature Granulocytes 12/03/2017 0.01  0.00 - 0.07 K/uL Final   Performed at [facility], [address], [place], [place] [address]  . Tech Review 12/03/2017 PLATELETS APPEAR ADEQUATE   Final   Comment: Normal platelet morphology RBC MORPHOLOGY NORMAL WBC MORPHOLOGY UNREMARKABLE MORPHOLOGY UNREMARKABLE Performed at [facility], [address] [place], [place] [address]     Assessment:  Neldon Shepard is a 57 y.o. male with mild pancytopenia.  CBCs from 07/31/2013 - 09/03/2017 reveal a hemoglobin from 10.1 - 14.7 without trend. MCV has been normal.  Platelets have ranged between 111,000 - 190,000.  WBC has ranged between 2000 - 8300.  WBC has been consistently low since 06/01/2017.  He drinks alcohol daily.  CBC on 09/03/2017 revealed a hematocrit of 35.4, hemoglobin 11.5, MCV 94.5, platelets 149,000, WBC 2000 with an [place] of 1000.  Differential included 49% segs, 34% lymphocytes, 14% monocytes, 2% eosinophils, and 1% basophils.  Outside labs note negative/normal peripheral smear, LDH, and SPEP/UPEP.  Work-up on 12/03/2017 revealed a hematocrit of 29.6, hemoglobin 9.3, MCV 97.7, WBC 2600 with an ANC of 1200.  Differential  included 48% segs, 36% lymphs, 14% monocytes, 1% eosinophils, and 1% basophils.  .  B12 was 332 (low normal).  Normal studies included:  Creatinine (1.05), LFTs, ferritin (257), iron studies, folate (7.4), TSH, copper, zinc, ANA, hepatitis B core antibody, hepatitis B surface antigen, hepatitis C antibody, and HIV testing.  Retic was 2.2%.  Peripheral smear revealed no abnormalities.  Abdomen and pelvic CT on 05/30/2017 revealed hepatic steatosis.  Spleen was normal.  He has a history of a GI bleed on 05/30/2017. EGD on 06/01/2017 revealed a normal esophagus, normal stomach, and duodenitis.  Biopsy revealed reactive duodenitis without dysplasia or malignancy.  Symptomatically, he feels "alright".  He lost 10 pounds 2 months ago.  Exam is unremarkable.  Plan: 1.  Review  work-up. 2.  Pancytopenia:  Discuss differential diagnosis includes alcohol and B12 deficiency.  Hepatitis B, hepatitis C, and HIV testing negative.  Copper, zinc normal.  Encourage decreasing alcohol consumption. 3.  B12 deficiency:  B12 injection today, then every other week until return. 4.  RTC in 1 month for MD assessment, labs (CBC with diff, folate, B12 - day before).   Lequita Asal, MD  12/11/2017, 5:00 PM

## 2017-12-28 ENCOUNTER — Inpatient Hospital Stay: Payer: Medicare Other | Attending: Hematology and Oncology

## 2017-12-28 DIAGNOSIS — E538 Deficiency of other specified B group vitamins: Secondary | ICD-10-CM | POA: Insufficient documentation

## 2017-12-28 MED ORDER — CYANOCOBALAMIN 1000 MCG/ML IJ SOLN
1000.0000 ug | Freq: Once | INTRAMUSCULAR | Status: AC
  Administered 2017-12-28: 1000 ug via INTRAMUSCULAR

## 2018-01-11 ENCOUNTER — Inpatient Hospital Stay: Payer: Medicare Other

## 2018-01-12 ENCOUNTER — Other Ambulatory Visit: Payer: Self-pay | Admitting: Hematology and Oncology

## 2018-01-12 ENCOUNTER — Inpatient Hospital Stay: Payer: Medicare Other | Admitting: Hematology and Oncology

## 2018-01-12 DIAGNOSIS — D72819 Decreased white blood cell count, unspecified: Secondary | ICD-10-CM

## 2018-01-12 DIAGNOSIS — D696 Thrombocytopenia, unspecified: Secondary | ICD-10-CM

## 2018-01-12 DIAGNOSIS — D649 Anemia, unspecified: Secondary | ICD-10-CM

## 2018-01-12 NOTE — Progress Notes (Deleted)
[facility] day:  01/12/2018   Chief Complaint: George Gomez is a 57 y.o. male with pancytopenia who is seen for 1 month assessment.  HPI:  The patient was last seen on the medical oncology office on 12/11/2017.  At that time, he felt "alright".  He had lost 10 pounds 2 months ago.  Exam was unremarkable.  CBC revealed a hematocrit of 29.6, hemoglobin 9.3, MCV 97.7, WBC 2600 with an ANC of 1200.  Differential included 48% segs, 36% lymphs, 14% monocytes, 1% eosinophils, and 1% basophils.    The etiology of his pancytopenia felt felt likely to alcohol and B12 defciency.    Past Medical History:  Diagnosis Date  . Anemia   . Diabetes mellitus without complication ([place])   . Diverticulitis    Pt states diverticulitis  . EtOH dependence ([place])   . Hypercholesteremia   . Hypertension     Past Surgical History:  Procedure Laterality Date  . ESOPHAGOGASTRODUODENOSCOPY (EGD) WITH PROPOFOL N/A 06/01/2017   Procedure: ESOPHAGOGASTRODUODENOSCOPY (EGD) WITH PROPOFOL;  Surgeon: Jonathon Bellows, MD;  Location: [facility] ENDOSCOPY;  Service: Gastroenterology;  Laterality: N/A;  . none      Family History  Problem Relation Age of Onset  . Rheum arthritis Mother   . Diabetes Father   . Heart disease Father   . Cancer Father   . Cancer Maternal Grandfather   . Cancer Paternal Grandfather     Social History:  reports that he has never smoked. He has never used smokeless tobacco. He reports current alcohol use. He reports that he does not use drugs.  He drinks liquor 2-3 x/day.  He is drinking less than he did in the past.  He did "crack" 10-15 years ago.  He lives in [place].  The patient is accompanied by his sister today.  Allergies: No Known Allergies  Current Medications: Current Outpatient Medications  Medication Sig Dispense Refill  . allopurinol (ZYLOPRIM) 300 MG tablet Take 1 tablet by mouth daily.  3  . atorvastatin (LIPITOR) 40 MG tablet  Take 1 tablet by mouth daily.  11  . gabapentin (NEURONTIN) 300 MG capsule Take 300 mg by mouth 2 (two) times daily.  3  . lisinopril (PRINIVIL,ZESTRIL) 10 MG tablet Take 1 tablet by mouth daily.  11  . losartan (COZAAR) 50 MG tablet Take 50 mg by mouth daily.  3  . metFORMIN (GLUCOPHAGE-XR) 500 MG 24 hr tablet Take 2 tablets by mouth daily.    . polyvinyl alcohol (LIQUIFILM TEARS) 1.4 % ophthalmic solution Place 1 drop into both eyes as needed for dry eyes. 15 mL 0  . propranolol ER (INDERAL LA) 60 MG 24 hr capsule Take 60 mg by mouth at bedtime.  1   No current facility-administered medications for this visit.     Review of Systems:  GENERAL:  Feels "alright".  No fevers, sweats.  Weight down 1 pound. PERFORMANCE STATUS (ECOG):  1 HEENT:  No visual changes, runny nose, sore throat, mouth sores or tenderness. Lungs: No shortness of breath or cough.  No hemoptysis. Cardiac:  No chest pain, palpitations, orthopnea, or PND. GI:  No nausea, vomiting, diarrhea, constipation, melena or hematochezia. GU:  No urgency, frequency, dysuria, or hematuria. Musculoskeletal:  No back pain.  Knee pain.  No muscle tenderness. Extremities:  No pain or swelling. Skin:  Dry skin.  No rashes or skin changes. Neuro:  Restless legs.  Diabetic neuropathy.  Shakes eased by thiamine.  No headache, numbness or weakness, balance or coordination issues. Endocrine:  Diabetes.  No thyroid issues, hot flashes or night sweats. Psych:  No mood changes, depression or anxiety. Pain:  Knee pain (6 out of 10). Review of systems:  All other systems reviewed and found to be negative.   Physical Exam: There were no vitals taken for this visit. GENERAL:  Well developed, well nourished, gentleman sitting comfortably in the exam room in no acute distress. MENTAL STATUS:  Alert and oriented to person, place and time. HEAD:  Dark hair.  Graying goatee.  Normocephalic, atraumatic, face symmetric, no Cushingoid features. EYES:   Brown eyes.  No conjunctivitis or scleral icterus. NEUROLOGICAL: Unremarkable. PSYCH:  Appropriate.    No visits with results within 3 Day(s) from this visit.  Latest known visit with results is:  Appointment on 12/03/2017  Component Date Value Ref Range Status  . Zinc 12/03/2017 71  56 - 134 ug/dL Final   Comment: (NOTE) This test was developed and its performance characteristics determined by [facility]. It has not been cleared or approved by the Food and Drug Administration.                                Detection Limit = 5 Performed At: [facility] [place], [place] [id number] Rush Farmer MD [id number]   . HIV Screen 4th Generation wRfx 12/03/2017 Non Reactive  Non Reactive Final   Comment: (NOTE) Performed At: [facility] [address] [place], [place] [id number] Rush Farmer MD WY:[id number]   . HCV Ab 12/03/2017 0.1  0.0 - 0.9 s/co ratio Final   Comment: (NOTE)                                  Negative:     < 0.8                             Indeterminate: 0.8 - 0.9                                  Positive:     > 0.9 The CDC recommends that a positive HCV antibody result be followed up with a HCV Nucleic Acid Amplification test (111552). Performed At: [facility] [place], [place] [id number] Rush Farmer MD [id number]   . Hepatitis B Surface Ag 12/03/2017 Negative  Negative Final   Comment: (NOTE) Performed At: [facility] [place], [place] [id number] Rush Farmer MD AP:[id number]   . Hep B Core Total Ab 12/03/2017 Negative  Negative Final   Comment: (NOTE) Performed At: [facility] [place], [place] [id number] Rush Farmer MD JK:[id number]   . Anti Nuclear Antibody(ANA) 12/03/2017 Negative  Negative Final   Comment: (NOTE) Performed At: [facility] [place], [place] [id number] Rush Farmer MD YJ:[id number]   .  Copper 12/03/2017 132  72 - 166 ug/dL Final   Comment: (NOTE) This test was developed and its performance characteristics determined by [facility]. It has not been cleared or approved by the Food and Drug Administration.  Detection Limit = 5 Performed At: [facility] [place], [place] [id number] Rush Farmer MD [id number]   . Retic Ct Pct 12/03/2017 2.2  0.4 - 3.1 % Final  . RBC. 12/03/2017 3.03* 4.22 - 5.81 MIL/uL Final  . Retic Count, Absolute 12/03/2017 65.8  19.0 - 186.0 K/uL Final  . Immature Retic Fract 12/03/2017 24.1* 2.3 - 15.9 % Final   Performed at [facility], [address], [place], [place] [address]  . TSH 12/03/2017 1.661  0.350 - 4.500 uIU/mL Final   Comment: Performed by a 3rd Generation assay with a functional sensitivity of <=0.01 uIU/mL. Performed at [facility], [address], [place], [place] [address]   . Folate 12/03/2017 7.4  >5.9 ng/mL Final   Performed at [facility], [address] [place], [place] [address]  . Vitamin B-12 12/03/2017 332  180 - 914 pg/mL Final   Comment: (NOTE) This assay is not validated for testing neonatal or myeloproliferative syndrome specimens for Vitamin B12 levels. Performed at [facility], [place] [address] [place], [place] [address]   . Iron 12/03/2017 65  45 - 182 ug/dL Final  . TIBC 12/03/2017 288  250 - 450 ug/dL Final  . Saturation Ratios 12/03/2017 23  17.9 - 39.5 % Final  . UIBC 12/03/2017 223  ug/dL Final   Performed at [facility], [address], [place], [place] [address]  . Ferritin 12/03/2017 257  24 - 336 ng/mL Final   Performed at [facility], [address] [place], [place] [address]  . Sodium 12/03/2017 140  135 - 145 mmol/L Final  . Potassium 12/03/2017 3.9  3.5 - 5.1 mmol/L Final  . Chloride 12/03/2017 106  98 - 111 mmol/L Final  . CO2 12/03/2017 25  22 - 32 mmol/L Final  . Glucose, Bld  12/03/2017 110* 70 - 99 mg/dL Final  . BUN 12/03/2017 19  6 - 20 mg/dL Final  . Creatinine, Ser 12/03/2017 1.05  0.61 - 1.24 mg/dL Final  . Calcium 12/03/2017 9.4  8.9 - 10.3 mg/dL Final  . Total Protein 12/03/2017 7.7  6.5 - 8.1 g/dL Final  . Albumin 12/03/2017 4.1  3.5 - 5.0 g/dL Final  . AST 12/03/2017 28  15 - 41 U/L Final  . ALT 12/03/2017 26  0 - 44 U/L Final  . Alkaline Phosphatase 12/03/2017 86  38 - 126 U/L Final  . Total Bilirubin 12/03/2017 0.5  0.3 - 1.2 mg/dL Final  . GFR calc non Af Amer 12/03/2017 >60  >60 mL/min Final  . GFR calc Af Amer 12/03/2017 >60  >60 mL/min Final   Comment: (NOTE) The eGFR has been calculated using the CKD EPI equation. This calculation has not been validated in all clinical situations. eGFR's persistently <60 mL/min signify possible Chronic Kidney Disease.   Georgiann Hahn gap 12/03/2017 9  5 - 15 Final   Performed at [facility], [address] [place],  [address]  . WBC 12/03/2017 2.6* 4.0 - 10.5 K/uL Final  . RBC 12/03/2017 3.03* 4.22 - 5.81 MIL/uL Final  . Hemoglobin 12/03/2017 9.3* 13.0 - 17.0 g/dL Final  . HCT 12/03/2017 29.6* 39.0 - 52.0 % Final  . MCV 12/03/2017 97.7  80.0 - 100.0 fL Final  . MCH 12/03/2017 30.7  26.0 - 34.0 pg Final  . MCHC 12/03/2017 31.4  30.0 - 36.0 g/dL Final  . RDW 12/03/2017 15.9* 11.5 - 15.5 % Final  .  Platelets 12/03/2017 174  150 - 400 K/uL Final  . nRBC 12/03/2017 0.0  0.0 - 0.2 % Final  . Neutrophils Relative % 12/03/2017 48  % Final  . Neutro Abs 12/03/2017 1.2* 1.7 - 7.7 K/uL Final  . Lymphocytes Relative 12/03/2017 36  % Final  . Lymphs Abs 12/03/2017 0.9  0.7 - 4.0 K/uL Final  . Monocytes Relative 12/03/2017 14  % Final  . Monocytes Absolute 12/03/2017 0.4  0.1 - 1.0 K/uL Final  . Eosinophils Relative 12/03/2017 1  % Final  . Eosinophils Absolute 12/03/2017 0.0  0.0 - 0.5 K/uL Final  . Basophils Relative 12/03/2017 1  % Final  . Basophils Absolute 12/03/2017 0.0  0.0 - 0.1 K/uL Final  .  Immature Granulocytes 12/03/2017 0  % Final  . Abs Immature Granulocytes 12/03/2017 0.01  0.00 - 0.07 K/uL Final   Performed at [facility], [address], [place], [place] [address]  . Tech Review 12/03/2017 PLATELETS APPEAR ADEQUATE   Final   Comment: Normal platelet morphology RBC MORPHOLOGY NORMAL WBC MORPHOLOGY UNREMARKABLE MORPHOLOGY UNREMARKABLE Performed at [facility], [address] [place], [place] [address]     Assessment:  George Gomez is a 57 y.o. male with mild pancytopenia.  CBCs from 07/31/2013 - 09/03/2017 reveal a hemoglobin from 10.1 - 14.7 without trend. MCV has been normal.  Platelets have ranged between 111,000 - 190,000.  WBC has ranged between 2000 - 8300.  WBC has been consistently low since 06/01/2017.  He drinks alcohol daily.  CBC on 09/03/2017 revealed a hematocrit of 35.4, hemoglobin 11.5, MCV 94.5, platelets 149,000, WBC 2000 with an [place] of 1000.  Differential included 49% segs, 34% lymphocytes, 14% monocytes, 2% eosinophils, and 1% basophils.  Outside labs note negative/normal peripheral smear, LDH, and SPEP/UPEP.  Work-up on 12/03/2017 revealed a hematocrit of 29.6, hemoglobin 9.3, MCV 97.7, WBC 2600 with an ANC of 1200.  Differential included 48% segs, 36% lymphs, 14% monocytes, 1% eosinophils, and 1% basophils.  .  B12 was 332 (low normal).  Normal studies included:  Creatinine (1.05), LFTs, ferritin (257), iron studies, folate (7.4), TSH, copper, zinc, ANA, hepatitis B core antibody, hepatitis B surface antigen, hepatitis C antibody, and HIV testing.  Retic was 2.2%.  Peripheral smear revealed no abnormalities.  Abdomen and pelvic CT on 05/30/2017 revealed hepatic steatosis.  Spleen was normal.  He has a history of a GI bleed on 05/30/2017. EGD on 06/01/2017 revealed a normal esophagus, normal stomach, and duodenitis.  Biopsy revealed reactive duodenitis without dysplasia or malignancy.  Symptomatically,  he feels "alright".  He lost 10  pounds 2 months ago.  Exam is unremarkable.  Plan: 1.  Labs today:  CBC with diff, folate, B12, flow cytometry.  2.  Pancytopenia:  Discuss differential diagnosis includes alcohol and B12 deficiency.  Hepatitis B, hepatitis C, and HIV testing negative.  Copper, zinc normal.  Encourage decreasing alcohol consumption. 3.  B12 deficiency:  B12 injection today, then every other week until return. 4.  RTC in 1 month for MD assessment, labs (CBC with diff, folate, B12 - day before).   Lequita Asal, MD  01/12/2018 , 4:53 AM   I saw and evaluated the patient, participating in the key portions of the service and reviewing pertinent diagnostic studies and records.  I reviewed the nurse practitioner's note and agree with the findings and the plan.  The assessment and plan were discussed with the patient.  Additional diagnostic studies of *** are  needed to clarify *** and would change the clinical management.  A few ***multiple questions were asked by the patient and answered.   Nolon Stalls, MD 01/12/2018,4:53 AM

## 2018-01-22 ENCOUNTER — Other Ambulatory Visit: Payer: Self-pay

## 2018-01-22 ENCOUNTER — Inpatient Hospital Stay (HOSPITAL_BASED_OUTPATIENT_CLINIC_OR_DEPARTMENT_OTHER): Payer: Medicare Other | Admitting: Hematology and Oncology

## 2018-01-22 ENCOUNTER — Inpatient Hospital Stay: Payer: Medicare Other

## 2018-01-22 VITALS — BP 166/92 | HR 68 | Temp 98.7°F | Ht 71.0 in | Wt 220.0 lb

## 2018-01-22 DIAGNOSIS — E538 Deficiency of other specified B group vitamins: Secondary | ICD-10-CM

## 2018-01-22 DIAGNOSIS — E119 Type 2 diabetes mellitus without complications: Secondary | ICD-10-CM

## 2018-01-22 DIAGNOSIS — D72819 Decreased white blood cell count, unspecified: Secondary | ICD-10-CM

## 2018-01-22 DIAGNOSIS — D649 Anemia, unspecified: Secondary | ICD-10-CM

## 2018-01-22 DIAGNOSIS — D61818 Other pancytopenia: Secondary | ICD-10-CM

## 2018-01-22 DIAGNOSIS — I10 Essential (primary) hypertension: Secondary | ICD-10-CM

## 2018-01-22 DIAGNOSIS — D696 Thrombocytopenia, unspecified: Secondary | ICD-10-CM

## 2018-01-22 DIAGNOSIS — Z79899 Other long term (current) drug therapy: Secondary | ICD-10-CM

## 2018-01-22 LAB — CBC WITH DIFFERENTIAL/PLATELET
Abs Immature Granulocytes: 0.05 10*3/uL (ref 0.00–0.07)
Basophils Absolute: 0 10*3/uL (ref 0.0–0.1)
Basophils Relative: 1 %
Eosinophils Absolute: 0 10*3/uL (ref 0.0–0.5)
Eosinophils Relative: 1 %
HCT: 33.4 % — ABNORMAL LOW (ref 39.0–52.0)
Hemoglobin: 10.3 g/dL — ABNORMAL LOW (ref 13.0–17.0)
Immature Granulocytes: 1 %
Lymphocytes Relative: 32 %
Lymphs Abs: 1.1 10*3/uL (ref 0.7–4.0)
MCH: 29.8 pg (ref 26.0–34.0)
MCHC: 30.8 g/dL (ref 30.0–36.0)
MCV: 96.5 fL (ref 80.0–100.0)
Monocytes Absolute: 0.6 10*3/uL (ref 0.1–1.0)
Monocytes Relative: 17 %
Neutro Abs: 1.7 10*3/uL (ref 1.7–7.7)
Neutrophils Relative %: 48 %
Platelets: 183 10*3/uL (ref 150–400)
RBC: 3.46 MIL/uL — ABNORMAL LOW (ref 4.22–5.81)
RDW: 14.3 % (ref 11.5–15.5)
WBC: 3.5 10*3/uL — ABNORMAL LOW (ref 4.0–10.5)
nRBC: 0.6 % — ABNORMAL HIGH (ref 0.0–0.2)

## 2018-01-22 LAB — VITAMIN B12: Vitamin B-12: 418 pg/mL (ref 180–914)

## 2018-01-22 LAB — FOLATE: Folate: 6.7 ng/mL (ref >5.9)

## 2018-01-22 MED ORDER — CYANOCOBALAMIN 1000 MCG/ML IJ SOLN
1000.0000 ug | Freq: Once | INTRAMUSCULAR | Status: AC
  Administered 2018-01-22: 1000 ug via INTRAMUSCULAR
  Filled 2018-01-22: qty 1

## 2018-01-22 NOTE — Progress Notes (Signed)
[facility] day:  01/22/2018   Chief Complaint: George Gomez is a 57 y.o. male with pancytopenia who is seen for 1 month assessment.  HPI:  The patient was last seen on the medical oncology office on 12/11/2017.  At that time, he felt "alright".  He had lost 10 pounds 2 months ago.  Exam was unremarkable.  CBC revealed a hematocrit of 29.6, hemoglobin 9.3, MCV 97.7, WBC 2600 with an ANC of 1200.  Differential included 48% segs, 36% lymphs, 14% monocytes, 1% eosinophils, and 1% basophils.    The etiology of his pancytopenia felt felt likely to alcohol and B12 deficiency.  He received B12 on 12/11/2017 and 12/28/2017.  During the interim, he has felt "just fine".  He has cut back on his alcohol intake.  He is drinking 2 drinks/day. His diet is good.  He is eating less than when he was younger.  He feels that he has more energy s/p initiation of B12.  He can now "get up and do something".   Past Medical History:  Diagnosis Date  . Anemia   . Diabetes mellitus without complication ([place])   . Diverticulitis    Pt states diverticulitis  . EtOH dependence ([place])   . Hypercholesteremia   . Hypertension     Past Surgical History:  Procedure Laterality Date  . ESOPHAGOGASTRODUODENOSCOPY (EGD) WITH PROPOFOL N/A 06/01/2017   Procedure: ESOPHAGOGASTRODUODENOSCOPY (EGD) WITH PROPOFOL;  Surgeon: Jonathon Bellows, MD;  Location: [facility] ENDOSCOPY;  Service: Gastroenterology;  Laterality: N/A;  . none      Family History  Problem Relation Age of Onset  . Rheum arthritis Mother   . Diabetes Father   . Heart disease Father   . Cancer Father   . Cancer Maternal Grandfather   . Cancer Paternal Grandfather     Social History:  reports that he has never smoked. He has never used smokeless tobacco. He reports current alcohol use. He reports that he does not use drugs.  He drinks liquor 2-3 x/day.  He is drinking less than he did in the past.  He did "crack" 10-15  years ago.  He lives in [place].  The patient is alone today.  Allergies: No Known Allergies  Current Medications: Current Outpatient Medications  Medication Sig Dispense Refill  . allopurinol (ZYLOPRIM) 300 MG tablet Take 1 tablet by mouth daily.  3  . atorvastatin (LIPITOR) 40 MG tablet Take 1 tablet by mouth daily.  11  . gabapentin (NEURONTIN) 300 MG capsule Take 300 mg by mouth 2 (two) times daily.  3  . lisinopril (PRINIVIL,ZESTRIL) 10 MG tablet Take 1 tablet by mouth daily.  11  . losartan (COZAAR) 50 MG tablet Take 50 mg by mouth daily.  3  . metFORMIN (GLUCOPHAGE-XR) 500 MG 24 hr tablet Take 2 tablets by mouth daily.    . polyvinyl alcohol (LIQUIFILM TEARS) 1.4 % ophthalmic solution Place 1 drop into both eyes as needed for dry eyes. 15 mL 0  . propranolol ER (INDERAL LA) 60 MG 24 hr capsule Take 60 mg by mouth at bedtime.  1   No current facility-administered medications for this visit.     Review of Systems:  GENERAL:  Feels "just fine".  No fevers, sweats.  Weight up 4 pounds. PERFORMANCE STATUS (ECOG):  1 HEENT:  No visual changes, runny nose, sore throat, mouth sores or tenderness.  Eye injection 02/08/2018. Lungs: No shortness of breath or cough.  No hemoptysis.  Cardiac:  No chest pain, palpitations, orthopnea, or PND. GI:  No nausea, vomiting, diarrhea, constipation, melena or hematochezia. GU:  No urgency, frequency, dysuria, or hematuria. Musculoskeletal:  No back pain.  Knee injection 02/28/2018.  No muscle tenderness. Extremities:  No pain or swelling. Skin:  Dry skin.  No rashes or skin changes. Neuro:  Diabetic neuropathy.  No headache, numbness or weakness, balance or coordination issues. Endocrine: Diabetes.  No thyroid issues, hot flashes or night sweats. Psych:  No mood changes, depression or anxiety. Pain:  Knee pain. Review of systems:  All other systems reviewed and found to be negative.   Physical Exam: Blood pressure (!) 166/92, pulse 68,  temperature 98.7 F (37.1 C), temperature source Tympanic, height 5' 11"  (1.803 m), weight 220 lb (99.8 kg). GENERAL:  Well developed, well nourished, gentleman sitting comfortably in the exam room in no acute distress. MENTAL STATUS:  Alert and oriented to person, place and time. HEAD:  Wearing a cap.  Gray hair and [employer].  Normocephalic, atraumatic, face symmetric, no Cushingoid features. EYES:  Brown eyes.  Pupils equal round and reactive to light and accomodation.  No conjunctivitis or scleral icterus. ENT:  Oropharynx clear without lesion.  Tongue normal. Mucous membranes moist.  RESPIRATORY:  Clear to auscultation without rales, wheezes or rhonchi. CARDIOVASCULAR:  Regular rate and rhythm without murmur, rub or gallop. ABDOMEN:  Soft, non-tender, with active bowel sounds, and no hepatosplenomegaly.  No masses. SKIN:  No rashes, ulcers or lesions. EXTREMITIES: No edema, no skin discoloration or tenderness.  No palpable cords. LYMPH NODES: No palpable cervical, supraclavicular, axillary or inguinal adenopathy  NEUROLOGICAL: Unremarkable. PSYCH:  Appropriate.    Orders Only on 01/22/2018  Component Date Value Ref Range Status  . Folate 01/22/2018 6.7  >5.9 ng/mL Final   Performed at [facility], [address] [place], [place] [address]  . Vitamin B-12 01/22/2018 418  180 - 914 pg/mL Final   Comment: (NOTE) This assay is not validated for testing neonatal or myeloproliferative syndrome specimens for Vitamin B12 levels. Performed at [facility], [place] [address] [place], [place] [address]   . WBC 01/22/2018 3.5* 4.0 - 10.5 K/uL Final  . RBC 01/22/2018 3.46* 4.22 - 5.81 MIL/uL Final  . Hemoglobin 01/22/2018 10.3* 13.0 - 17.0 g/dL Final  . HCT 01/22/2018 33.4* 39.0 - 52.0 % Final  . MCV 01/22/2018 96.5  80.0 - 100.0 fL Final  . MCH 01/22/2018 29.8  26.0 - 34.0 pg Final  . MCHC 01/22/2018 30.8  30.0 - 36.0 g/dL Final  . RDW 01/22/2018 14.3  11.5 - 15.5 % Final  .  Platelets 01/22/2018 183  150 - 400 K/uL Final  . nRBC 01/22/2018 0.6* 0.0 - 0.2 % Final  . Neutrophils Relative % 01/22/2018 48  % Final  . Neutro Abs 01/22/2018 1.7  1.7 - 7.7 K/uL Final  . Lymphocytes Relative 01/22/2018 32  % Final  . Lymphs Abs 01/22/2018 1.1  0.7 - 4.0 K/uL Final  . Monocytes Relative 01/22/2018 17  % Final  . Monocytes Absolute 01/22/2018 0.6  0.1 - 1.0 K/uL Final  . Eosinophils Relative 01/22/2018 1  % Final  . Eosinophils Absolute 01/22/2018 0.0  0.0 - 0.5 K/uL Final  . Basophils Relative 01/22/2018 1  % Final  . Basophils Absolute 01/22/2018 0.0  0.0 - 0.1 K/uL Final  . Immature Granulocytes 01/22/2018 1  % Final  . Abs Immature Granulocytes 01/22/2018 0.05  0.00 - 0.07 K/uL  Final   Performed at [facility], [address], [place], [place] [address]  . PATH INTERP XXX-IMP 01/22/2018 Comment   Final   Comment: (NOTE) 1) Neutrophils show a left shift. 2) Increased mature monocytes (14% of analyzed cells), nonspecific.   Hulen Skains COMMENT IMP 01/22/2018 Comment   Corrected   Comment: (NOTE) Correlation with available clinical, laboratory, and morphologic data is recommended.   Marland Kitchen Specimen Type 01/22/2018 Comment   Final   Peripheral blood  . ASSESSMENT OF LEUKOCYTES 01/22/2018 Comment   Final   Comment: (NOTE) No monoclonal B cell population is detected. kappa:lambda ratio 1.3 There is no loss of, or aberrant expression of, the pan T cell antigens to suggest a neoplastic T cell process. CD4:CD8 ratio 5.0 No circulating blasts are detected. Granulocytes show slightly left-shifted maturation. Mature monocytes are relatively increased (14%), nonspecific. Analysis of the leukocyte population shows: granulocytes 58%, monocytes 14%, lymphocytes 28%, blasts <0.5%, B cells 3%, T cells 22%,  NK cells 3%.   . % Viable Cells 01/22/2018 Comment   Corrected   Comment: (NOTE) 97% Professional component performed by [facility] at [address] [place] [address]- CLIA ID: [id number] - Medical Director: Dimas Millin, M.D. [facility] is a wholly-owned subsidiary of [employer].   . ANALYSIS AND GATING STRATEGY 01/22/2018 Comment   Final   8 color analysis with CD45/SSC gating  . IMMUNOPHENOTYPING STUDY 01/22/2018 Comment   Final   Comment: (NOTE) CD2       Normal         CD3       Normal CD4       Normal         CD5       Normal CD7       Normal         CD8       Normal CD10      Normal         CD11b     Normal CD13      Normal         CD14      Normal CD16      Normal         CD19      Normal CD20      Normal         CD33      Normal CD34      Normal         CD38      Normal CD45      Normal         CD56      Normal CD57      Normal         CD117     Normal HLA-DR    Normal         KAPPA     Normal LAMBDA    Normal         CD64      Normal   . PATHOLOGIST NAME 01/22/2018 Comment   Final   Dimas Millin, M.D.  . COMMENT: 01/22/2018 Comment   Corrected   Comment: (NOTE) Each antibody in this assay was utilized to assess for potential abnormalities of studied cell populations or to characterize identified abnormalities. This test was developed and its performance characteristics determined by [facility].  It has not been cleared or approved by the U.S. Food and Drug Administration. The FDA  has determined that such clearance or approval is not necessary. This test is used for clinical purposes.  It should not be regarded as investigational or for research. Performed At: -[facility] [address] [place], [place] [id number] Nechama Guard MD [id number] Performed At: [facility] [address] [place], [place] [id number] Nechama Guard MD [id number]     Assessment:  Selestino Nila is a 57 y.o. male with mild pancytopenia.  CBCs from 07/31/2013 - 09/03/2017 reveal a  hemoglobin from 10.1 - 14.7 without trend. MCV has been normal.  Platelets have ranged between 111,000 - 190,000.  WBC has ranged between 2000 - 8300.  WBC has been consistently low since 06/01/2017.  He drinks alcohol daily.  CBC on 09/03/2017 revealed a hematocrit of 35.4, hemoglobin 11.5, MCV 94.5, platelets 149,000, WBC 2000 with an [place] of 1000.  Differential included 49% segs, 34% lymphocytes, 14% monocytes, 2% eosinophils, and 1% basophils.  Outside labs note negative/normal peripheral smear, LDH, and SPEP/UPEP.  Work-up on 12/03/2017 revealed a hematocrit of 29.6, hemoglobin 9.3, MCV 97.7, WBC 2600 with an ANC of 1200.  Differential included 48% segs, 36% lymphs, 14% monocytes, 1% eosinophils, and 1% basophils.  .  B12 was 332 (low normal).  Normal studies included:  Creatinine (1.05), LFTs, ferritin (257), iron studies, folate (7.4), TSH, copper, zinc, ANA, hepatitis B core antibody, hepatitis B surface antigen, hepatitis C antibody, and HIV testing.  Retic was 2.2%.  Peripheral smear revealed no abnormalities.  He has B12 deficiency.  He began B12 injections on 12/11/2017 (last 12/28/2017).  Abdomen and pelvic CT on 05/30/2017 revealed hepatic steatosis.  Spleen was normal.  He has a history of a GI bleed on 05/30/2017. EGD on 06/01/2017 revealed a normal esophagus, normal stomach, and duodenitis.  Biopsy revealed reactive duodenitis without dysplasia or malignancy.  Symptomatically, he feels "just fine".  Energy level has improved with B12 supplementation.  Weight is up 4 pounds.  He is cutting back on his alcohol.  Exam is stable.  Plan: 1.  Labs today:  CBC with diff, folate, B12, flow cytometry. 2.  Pancytopenia:  Etiology felt secondary to alcohol and B12 deficiency.  Hepatitis B, hepatitis C, and HIV testing negative.  Copper, zinc are normal.  Continue to decrease alcohol consumption.  Check flow cytometry today.  Discuss consideration of bone marrow if counts do not improve  with resolution of B12 deficiency and decreased alcohol intake. 3.  B12 deficiency:  B12 today. 4.  RTC in [place] on 01/10, 01/24 (add labs before injection- B12 and folate), and 03/05/2018 for B12. 5.  RTC on 03/19/2018 in [place] for MD assessment, labs (CBC with diff), and +/- B12.   Lequita Asal, MD  01/22/2018, 4:35 PM

## 2018-01-26 LAB — COMP PANEL: LEUKEMIA/LYMPHOMA

## 2018-02-05 ENCOUNTER — Inpatient Hospital Stay: Payer: Medicare Other | Attending: Hematology and Oncology

## 2018-02-05 ENCOUNTER — Inpatient Hospital Stay: Payer: Medicare Other

## 2018-02-05 DIAGNOSIS — E538 Deficiency of other specified B group vitamins: Secondary | ICD-10-CM | POA: Diagnosis present

## 2018-02-05 DIAGNOSIS — D72819 Decreased white blood cell count, unspecified: Secondary | ICD-10-CM

## 2018-02-05 LAB — VITAMIN B12: Vitamin B-12: 562 pg/mL (ref 180–914)

## 2018-02-05 LAB — FOLATE: Folate: 8 ng/mL (ref >5.9)

## 2018-02-05 MED ORDER — CYANOCOBALAMIN 1000 MCG/ML IJ SOLN
1000.0000 ug | Freq: Once | INTRAMUSCULAR | Status: AC
  Administered 2018-02-05: 1000 ug via INTRAMUSCULAR

## 2018-02-19 ENCOUNTER — Inpatient Hospital Stay: Payer: Medicare Other

## 2018-02-19 DIAGNOSIS — E538 Deficiency of other specified B group vitamins: Secondary | ICD-10-CM

## 2018-02-19 DIAGNOSIS — D72819 Decreased white blood cell count, unspecified: Secondary | ICD-10-CM

## 2018-02-19 LAB — FOLATE: Folate: 9.5 ng/mL (ref >5.9)

## 2018-02-19 LAB — VITAMIN B12: Vitamin B-12: 610 pg/mL (ref 180–914)

## 2018-02-19 MED ORDER — CYANOCOBALAMIN 1000 MCG/ML IJ SOLN
1000.0000 ug | Freq: Once | INTRAMUSCULAR | Status: AC
  Administered 2018-02-19: 1000 ug via INTRAMUSCULAR

## 2018-03-05 ENCOUNTER — Other Ambulatory Visit: Payer: Medicare Other

## 2018-03-05 ENCOUNTER — Inpatient Hospital Stay: Payer: Medicare Other | Attending: Hematology and Oncology

## 2018-03-05 DIAGNOSIS — E538 Deficiency of other specified B group vitamins: Secondary | ICD-10-CM | POA: Insufficient documentation

## 2018-03-15 ENCOUNTER — Inpatient Hospital Stay: Payer: Medicare Other

## 2018-03-15 DIAGNOSIS — E538 Deficiency of other specified B group vitamins: Secondary | ICD-10-CM

## 2018-03-15 MED ORDER — CYANOCOBALAMIN 1000 MCG/ML IJ SOLN
1000.0000 ug | Freq: Once | INTRAMUSCULAR | Status: AC
  Administered 2018-03-15: 1000 ug via INTRAMUSCULAR

## 2018-03-19 ENCOUNTER — Inpatient Hospital Stay (HOSPITAL_BASED_OUTPATIENT_CLINIC_OR_DEPARTMENT_OTHER): Payer: Medicare Other | Admitting: Hematology and Oncology

## 2018-03-19 ENCOUNTER — Ambulatory Visit: Payer: Medicare Other

## 2018-03-19 ENCOUNTER — Encounter: Payer: Self-pay | Admitting: Hematology and Oncology

## 2018-03-19 ENCOUNTER — Inpatient Hospital Stay: Payer: Medicare Other

## 2018-03-19 VITALS — BP 152/78 | HR 69 | Temp 99.5°F | Resp 18 | Ht 71.0 in | Wt 224.5 lb

## 2018-03-19 DIAGNOSIS — E538 Deficiency of other specified B group vitamins: Secondary | ICD-10-CM | POA: Diagnosis not present

## 2018-03-19 DIAGNOSIS — D72818 Other decreased white blood cell count: Secondary | ICD-10-CM | POA: Diagnosis not present

## 2018-03-19 DIAGNOSIS — E119 Type 2 diabetes mellitus without complications: Secondary | ICD-10-CM

## 2018-03-19 DIAGNOSIS — D72819 Decreased white blood cell count, unspecified: Secondary | ICD-10-CM

## 2018-03-19 DIAGNOSIS — F1010 Alcohol abuse, uncomplicated: Secondary | ICD-10-CM

## 2018-03-19 DIAGNOSIS — D696 Thrombocytopenia, unspecified: Secondary | ICD-10-CM | POA: Diagnosis not present

## 2018-03-19 DIAGNOSIS — D649 Anemia, unspecified: Secondary | ICD-10-CM | POA: Diagnosis not present

## 2018-03-19 DIAGNOSIS — I10 Essential (primary) hypertension: Secondary | ICD-10-CM

## 2018-03-19 LAB — CBC WITH DIFFERENTIAL/PLATELET
Abs Immature Granulocytes: 0.03 10*3/uL (ref 0.00–0.07)
Basophils Absolute: 0 10*3/uL (ref 0.0–0.1)
Basophils Relative: 1 %
Eosinophils Absolute: 0.1 10*3/uL (ref 0.0–0.5)
Eosinophils Relative: 1 %
HCT: 35.1 % — ABNORMAL LOW (ref 39.0–52.0)
Hemoglobin: 11.3 g/dL — ABNORMAL LOW (ref 13.0–17.0)
Immature Granulocytes: 1 %
Lymphocytes Relative: 26 %
Lymphs Abs: 1.2 10*3/uL (ref 0.7–4.0)
MCH: 30.1 pg (ref 26.0–34.0)
MCHC: 32.2 g/dL (ref 30.0–36.0)
MCV: 93.4 fL (ref 80.0–100.0)
Monocytes Absolute: 0.5 10*3/uL (ref 0.1–1.0)
Monocytes Relative: 11 %
Neutro Abs: 2.6 10*3/uL (ref 1.7–7.7)
Neutrophils Relative %: 60 %
Platelets: 168 10*3/uL (ref 150–400)
RBC: 3.76 MIL/uL — ABNORMAL LOW (ref 4.22–5.81)
RDW: 14.2 % (ref 11.5–15.5)
WBC: 4.4 10*3/uL (ref 4.0–10.5)
nRBC: 0 % (ref 0.0–0.2)

## 2018-03-19 NOTE — Progress Notes (Signed)
[facility]-  Cancer Center  Clinic day:  03/19/2018  Chief Complaint: George Gomez is a 58 y.o. male with pancytopenia who is seen for 2 month assessment.  HPI:  The patient was last seen on the medical oncology office on 01/22/2018.  At that time, he felt "just fine".  Energy level had improved with B12 supplementation.  Weight was up 4 pounds.  He was cutting back on his alcohol.  Exam was stable.  CBC revealed a hematocrit of 33.4, hemoglobin 10.3, MCV 96.5, platelets 183,000, WBC 3500 with an ANC of 1700.  Flow cytometry revealed neutrophils with a left shift and increased mature monocytes (14% of analyzed cells), nonspecific.   Labs on 02/19/2018 revealed a B12 of 610 and a folate of 9.5.  During the interim, patient is doing well today. Energy continues improve. He does not make note of any acute or concerning symptoms. He feels generally well. Patient denies that he has experienced any B symptoms. He denies any interval infections.  Patient advises that he maintains an adequate appetite. He is eating well. Weight today is 224 lb 8.6 oz (101.9 kg), which compared to his last visit to the clinic, represents a 4 pound increase. Patient continues to drink alcohol (moonshine) daily.  He completed 6 weeks of B12 injections. He is scheduled to start monthly B12 injections.   Patient denies pain in the clinic today.   Past Medical History:  Diagnosis Date  . Anemia   . Diabetes mellitus without complication ([facility])   . Diverticulitis    Pt states diverticulitis  . EtOH dependence ([facility])   . Hypercholesteremia   . Hypertension     Past Surgical History:  Procedure Laterality Date  . ESOPHAGOGASTRODUODENOSCOPY (EGD) WITH PROPOFOL N/A 06/01/2017   Procedure: ESOPHAGOGASTRODUODENOSCOPY (EGD) WITH PROPOFOL;  Surgeon: Wyline Mood, MD;  Location: [facility] ENDOSCOPY;  Service: Gastroenterology;  Laterality: N/A;  . none      Family History  Problem Relation Age of Onset  .  Rheum arthritis Mother   . Diabetes Father   . Heart disease Father   . Cancer Father   . Cancer Maternal Grandfather   . Cancer Paternal Grandfather     Social History:  reports that he has never smoked. He has never used smokeless tobacco. He reports current alcohol use. He reports that he does not use drugs.  He drinks liquor 2-3 x/day.  He is drinking less than he did in the past (moonshine).  He did "crack" 10-15 years ago.  He lives in [place].  The patient is alone today.  Allergies: No Known Allergies  Current Medications: Current Outpatient Medications  Medication Sig Dispense Refill  . allopurinol (ZYLOPRIM) 300 MG tablet Take 1 tablet by mouth daily.  3  . atorvastatin (LIPITOR) 40 MG tablet Take 1 tablet by mouth daily.  11  . gabapentin (NEURONTIN) 300 MG capsule Take 300 mg by mouth 2 (two) times daily.  3  . lisinopril (PRINIVIL,ZESTRIL) 10 MG tablet Take 1 tablet by mouth daily.  11  . losartan (COZAAR) 50 MG tablet Take 100 mg by mouth daily.   3  . metFORMIN (GLUCOPHAGE-XR) 500 MG 24 hr tablet Take 2 tablets by mouth daily.    . propranolol ER (INDERAL LA) 60 MG 24 hr capsule Take 60 mg by mouth at bedtime.  1  . polyvinyl alcohol (LIQUIFILM TEARS) 1.4 % ophthalmic solution Place 1 drop into both eyes as needed for dry eyes. (Patient not taking: Reported  on 03/19/2018) 15 mL 0   No current facility-administered medications for this visit.     Review of Systems:  GENERAL:  Energy level improving.  No fevers, sweats.  Weight up 4 pounds. PERFORMANCE STATUS (ECOG):  1 HEENT:  No visual changes, runny nose, sore throat, mouth sores or tenderness. Lungs: No shortness of breath or cough.  No hemoptysis. Cardiac:  No chest pain, palpitations, orthopnea, or PND. GI:  Eating well.  No nausea, vomiting, diarrhea, constipation, melena or hematochezia. GU:  No urgency, frequency, dysuria, or hematuria.  Passed a kidney stone. Musculoskeletal:  No back pain.  No joint pain.   No muscle tenderness. Extremities:  No pain or swelling. Skin:  No rashes or skin changes. Neuro:  Diabetic neuropathy.  No headache, focal weakness, balance or coordination issues. Endocrine:  Diabetes.  No thyroid issues, hot flashes or night sweats. Psych:  No mood changes, depression or anxiety. Pain:  No focal pain. Review of systems:  All other systems reviewed and found to be negative.   Physical Exam: Blood pressure (!) 152/78, pulse 69, temperature 99.5 F (37.5 C), resp. rate 18, height 5\' 11"  (1.803 m), weight 224 lb 8.6 oz (101.9 kg), SpO2 99 %. GENERAL:  Well developed, well nourished, gentleman sitting comfortably in the exam room in no acute distress. MENTAL STATUS:  Alert and oriented to person, place and time. HEAD:  Wearing a blue cap.  Gray hair and [employer].  Normocephalic, atraumatic, face symmetric, no Cushingoid features. EYES:  Brown eyes.  Pupils equal round and reactive to light and accomodation.  No conjunctivitis or scleral icterus. ENT:  Oropharynx clear without lesion.  Tongue normal. Mucous membranes moist.  RESPIRATORY:  Clear to auscultation without rales, wheezes or rhonchi. CARDIOVASCULAR:  Regular rate and rhythm without murmur, rub or gallop. ABDOMEN:  Soft, non-tender, with active bowel sounds, and no hepatosplenomegaly.  No masses. SKIN:  No rashes, ulcers or lesions. EXTREMITIES: No edema, no skin discoloration or tenderness.  No palpable cords. LYMPH NODES: No palpable cervical, supraclavicular, axillary or inguinal adenopathy  NEUROLOGICAL: Unremarkable. PSYCH:  Appropriate.    Appointment on 03/19/2018  Component Date Value Ref Range Status  . WBC 03/19/2018 4.4  4.0 - 10.5 K/uL Final  . RBC 03/19/2018 3.76* 4.22 - 5.81 MIL/uL Final  . Hemoglobin 03/19/2018 11.3* 13.0 - 17.0 g/dL Final  . HCT 19/14/782902/21/2020 35.1* 39.0 - 52.0 % Final  . MCV 03/19/2018 93.4  80.0 - 100.0 fL Final  . MCH 03/19/2018 30.1  26.0 - 34.0 pg Final  . MCHC 03/19/2018 32.2   30.0 - 36.0 g/dL Final  . RDW 56/21/308602/21/2020 14.2  11.5 - 15.5 % Final  . Platelets 03/19/2018 168  150 - 400 K/uL Final  . nRBC 03/19/2018 0.0  0.0 - 0.2 % Final  . Neutrophils Relative % 03/19/2018 60  % Final  . Neutro Abs 03/19/2018 2.6  1.7 - 7.7 K/uL Final  . Lymphocytes Relative 03/19/2018 26  % Final  . Lymphs Abs 03/19/2018 1.2  0.7 - 4.0 K/uL Final  . Monocytes Relative 03/19/2018 11  % Final  . Monocytes Absolute 03/19/2018 0.5  0.1 - 1.0 K/uL Final  . Eosinophils Relative 03/19/2018 1  % Final  . Eosinophils Absolute 03/19/2018 0.1  0.0 - 0.5 K/uL Final  . Basophils Relative 03/19/2018 1  % Final  . Basophils Absolute 03/19/2018 0.0  0.0 - 0.1 K/uL Final  . Immature Granulocytes 03/19/2018 1  % Final  . Abs Immature  Granulocytes 03/19/2018 0.03  0.00 - 0.07 K/uL Final   Performed at [facility], [address], [place], [place] [address]    Assessment:  George Gomez is a 58 y.o. male with mild pancytopenia.  CBCs from 07/31/2013 - 09/03/2017 reveal a hemoglobin from 10.1 - 14.7 without trend. MCV has been normal.  Platelets have ranged between 111,000 - 190,000.  WBC has ranged between 2000 - 8300.  WBC has been consistently low since 06/01/2017.  He drinks alcohol daily.  CBC on 09/03/2017 revealed a hematocrit of 35.4, hemoglobin 11.5, MCV 94.5, platelets 149,000, WBC 2000 with an ANC of 1000.  Differential included 49% segs, 34% lymphocytes, 14% monocytes, 2% eosinophils, and 1% basophils.  Outside labs note negative/normal peripheral smear, LDH, and SPEP/UPEP.  Work-up on 12/03/2017 revealed a hematocrit of 29.6, hemoglobin 9.3, MCV 97.7, WBC 2600 with an ANC of 1200.  Differential included 48% segs, 36% lymphs, 14% monocytes, 1% eosinophils, and 1% basophils.  .  B12 was 332 (low normal).  Normal studies included:  Creatinine (1.05), LFTs, ferritin (257), iron studies, folate (7.4), TSH, copper, zinc, ANA, hepatitis B core antibody, hepatitis B surface antigen,  hepatitis C antibody, and HIV testing.  Retic was 2.2%.  Peripheral smear revealed no abnormalities.  Flow cytometry on 01/23/2019 was negative.  He has B12 deficiency.  He began B12 injections on 12/11/2017 (last 03/15/2018). B12 was 610 and a folate of 9.5.  Abdomen and pelvic CT on 05/30/2017 revealed hepatic steatosis.  Spleen was normal.  He has a history of a GI bleed on 05/30/2017. EGD on 06/01/2017 revealed a normal esophagus, normal stomach, and duodenitis.  Biopsy revealed reactive duodenitis without dysplasia or malignancy.  Symptomatically, he notes more energy.  Exam is unremarkable.  Hemoglobin is 11.3 with an MCV 93.4.  Platelets are 168,000.  White count 4400 (ANC 2600).  Plan: 1.  Labs today:  CBC with diff. 2.  Pancytopenia  Hemoglobin 11.3.  Platelets 168,000.  White count 4400 (ANC 2600).  Thrombocytopenia and leukopenia have resolved with B12 supplementation.  Work-up negative: hepatitis B, hepatitis C, HIV, copper, zinc, and flow cytometry.  Etiology felt secondary to alcohol and B12 deficiency.   Encourage cutting back on alcohol. 3.  B12 deficiency  Continue monthly B12 injections (last 03/15/2018).  Folate 9.5 on 02/19/2018.  Check folate level every 6 months. 4.  RTC in 3 months for MD assessment, labs (CBC with diff), and B12 injection.   Quentin Mulling, NP  03/19/2018, 2:53 PM   I saw and evaluated the patient, participating in the key portions of the service and reviewing pertinent diagnostic studies and records.  I reviewed the nurse practitioner's note and agree with the findings and the plan.  The assessment and plan were discussed with the patient.  Several questions were asked by the patient and answered.   Nelva Nay, MD 03/19/2018,2:53 PM

## 2018-03-19 NOTE — Progress Notes (Signed)
No new changes noted today 

## 2018-04-12 ENCOUNTER — Inpatient Hospital Stay: Payer: Medicare Other | Attending: Hematology and Oncology

## 2018-04-12 DIAGNOSIS — E538 Deficiency of other specified B group vitamins: Secondary | ICD-10-CM | POA: Insufficient documentation

## 2018-04-13 ENCOUNTER — Other Ambulatory Visit: Payer: Self-pay

## 2018-04-13 ENCOUNTER — Inpatient Hospital Stay: Payer: Medicare Other

## 2018-04-13 DIAGNOSIS — E538 Deficiency of other specified B group vitamins: Secondary | ICD-10-CM

## 2018-04-13 MED ORDER — CYANOCOBALAMIN 1000 MCG/ML IJ SOLN
1000.0000 ug | Freq: Once | INTRAMUSCULAR | Status: AC
  Administered 2018-04-13: 1000 ug via INTRAMUSCULAR

## 2018-04-13 NOTE — Patient Instructions (Signed)
Cyanocobalamin, Vitamin B12 injection What is this medicine? CYANOCOBALAMIN (sye an oh koe BAL a min) is a man made form of vitamin B12. Vitamin B12 is used in the growth of healthy blood cells, nerve cells, and proteins in the body. It also helps with the metabolism of fats and carbohydrates. This medicine is used to treat people who can not absorb vitamin B12. This medicine may be used for other purposes; ask your health care provider or pharmacist if you have questions. COMMON BRAND NAME(S): B-12 Compliance Kit, B-12 Injection Kit, Cyomin, LA-12, Nutri-Twelve, Physicians EZ Use B-12, Primabalt What should I tell my health care provider before I take this medicine? They need to know if you have any of these conditions: -kidney disease -Leber's disease -megaloblastic anemia -an unusual or allergic reaction to cyanocobalamin, cobalt, other medicines, foods, dyes, or preservatives -pregnant or trying to get pregnant -breast-feeding How should I use this medicine? This medicine is injected into a muscle or deeply under the skin. It is usually given by a health care professional in a clinic or doctor's office. However, your doctor may teach you how to inject yourself. Follow all instructions. Talk to your pediatrician regarding the use of this medicine in children. Special care may be needed. Overdosage: If you think you have taken too much of this medicine contact a poison control center or emergency room at once. NOTE: This medicine is only for you. Do not share this medicine with others. What if I miss a dose? If you are given your dose at a clinic or doctor's office, call to reschedule your appointment. If you give your own injections and you miss a dose, take it as soon as you can. If it is almost time for your next dose, take only that dose. Do not take double or extra doses. What may interact with this medicine? -colchicine -heavy alcohol intake This list may not describe all possible  interactions. Give your health care provider a list of all the medicines, herbs, non-prescription drugs, or dietary supplements you use. Also tell them if you smoke, drink alcohol, or use illegal drugs. Some items may interact with your medicine. What should I watch for while using this medicine? Visit your doctor or health care professional regularly. You may need blood work done while you are taking this medicine. You may need to follow a special diet. Talk to your doctor. Limit your alcohol intake and avoid smoking to get the best benefit. What side effects may I notice from receiving this medicine? Side effects that you should report to your doctor or health care professional as soon as possible: -allergic reactions like skin rash, itching or hives, swelling of the face, lips, or tongue -blue tint to skin -chest tightness, pain -difficulty breathing, wheezing -dizziness -red, swollen painful area on the leg Side effects that usually do not require medical attention (report to your doctor or health care professional if they continue or are bothersome): -diarrhea -headache This list may not describe all possible side effects. Call your doctor for medical advice about side effects. You may report side effects to FDA at 1-800-FDA-1088. Where should I keep my medicine? Keep out of the reach of children. Store at room temperature between 15 and 30 degrees C (59 and 85 degrees F). Protect from light. Throw away any unused medicine after the expiration date. NOTE: This sheet is a summary. It may not cover all possible information. If you have questions about this medicine, talk to your doctor, pharmacist, or   health care provider.  2019 Elsevier/Gold Standard (2007-04-26 22:10:20)

## 2018-05-10 ENCOUNTER — Inpatient Hospital Stay: Payer: Medicare Other

## 2018-06-02 ENCOUNTER — Other Ambulatory Visit: Payer: Self-pay | Admitting: Physician Assistant

## 2018-06-02 DIAGNOSIS — R0600 Dyspnea, unspecified: Secondary | ICD-10-CM

## 2018-06-07 ENCOUNTER — Ambulatory Visit: Payer: Medicare Other

## 2018-06-07 ENCOUNTER — Other Ambulatory Visit: Payer: Medicare Other

## 2018-06-07 ENCOUNTER — Ambulatory Visit: Payer: Medicare Other | Admitting: Hematology and Oncology

## 2018-06-10 ENCOUNTER — Other Ambulatory Visit: Payer: Self-pay

## 2018-06-10 ENCOUNTER — Ambulatory Visit (INDEPENDENT_AMBULATORY_CARE_PROVIDER_SITE_OTHER): Payer: Medicare Other

## 2018-06-10 DIAGNOSIS — R0600 Dyspnea, unspecified: Secondary | ICD-10-CM

## 2018-07-01 ENCOUNTER — Other Ambulatory Visit: Payer: Self-pay

## 2018-07-01 ENCOUNTER — Inpatient Hospital Stay
Admission: EM | Admit: 2018-07-01 | Discharge: 2018-07-02 | DRG: 441 | Disposition: A | Discharge status: Home / Self Care | Payer: Medicare Other | Attending: Specialist | Admitting: Specialist

## 2018-07-01 ENCOUNTER — Inpatient Hospital Stay: Payer: Medicare Other

## 2018-07-01 ENCOUNTER — Emergency Department: Payer: Medicare Other

## 2018-07-01 ENCOUNTER — Encounter: Payer: Self-pay | Admitting: Intensive Care

## 2018-07-01 DIAGNOSIS — Z833 Family history of diabetes mellitus: Secondary | ICD-10-CM

## 2018-07-01 DIAGNOSIS — E7800 Pure hypercholesterolemia, unspecified: Secondary | ICD-10-CM | POA: Diagnosis present

## 2018-07-01 DIAGNOSIS — I10 Essential (primary) hypertension: Secondary | ICD-10-CM | POA: Diagnosis present

## 2018-07-01 DIAGNOSIS — K2921 Alcoholic gastritis with bleeding: Secondary | ICD-10-CM | POA: Diagnosis present

## 2018-07-01 DIAGNOSIS — E119 Type 2 diabetes mellitus without complications: Secondary | ICD-10-CM

## 2018-07-01 DIAGNOSIS — Z8261 Family history of arthritis: Secondary | ICD-10-CM

## 2018-07-01 DIAGNOSIS — K2920 Alcoholic gastritis without bleeding: Secondary | ICD-10-CM | POA: Diagnosis present

## 2018-07-01 DIAGNOSIS — R1013 Epigastric pain: Secondary | ICD-10-CM

## 2018-07-01 DIAGNOSIS — Z809 Family history of malignant neoplasm, unspecified: Secondary | ICD-10-CM | POA: Diagnosis not present

## 2018-07-01 DIAGNOSIS — D638 Anemia in other chronic diseases classified elsewhere: Secondary | ICD-10-CM | POA: Diagnosis present

## 2018-07-01 DIAGNOSIS — K921 Melena: Secondary | ICD-10-CM

## 2018-07-01 DIAGNOSIS — Z7984 Long term (current) use of oral hypoglycemic drugs: Secondary | ICD-10-CM | POA: Diagnosis not present

## 2018-07-01 DIAGNOSIS — K766 Portal hypertension: Secondary | ICD-10-CM | POA: Diagnosis present

## 2018-07-01 DIAGNOSIS — Z8249 Family history of ischemic heart disease and other diseases of the circulatory system: Secondary | ICD-10-CM

## 2018-07-01 DIAGNOSIS — K5792 Diverticulitis of intestine, part unspecified, without perforation or abscess without bleeding: Secondary | ICD-10-CM | POA: Diagnosis present

## 2018-07-01 DIAGNOSIS — F10229 Alcohol dependence with intoxication, unspecified: Secondary | ICD-10-CM | POA: Diagnosis present

## 2018-07-01 DIAGNOSIS — G4730 Sleep apnea, unspecified: Secondary | ICD-10-CM | POA: Diagnosis present

## 2018-07-01 DIAGNOSIS — N179 Acute kidney failure, unspecified: Secondary | ICD-10-CM | POA: Diagnosis present

## 2018-07-01 DIAGNOSIS — K3189 Other diseases of stomach and duodenum: Secondary | ICD-10-CM | POA: Diagnosis present

## 2018-07-01 DIAGNOSIS — E785 Hyperlipidemia, unspecified: Secondary | ICD-10-CM | POA: Diagnosis present

## 2018-07-01 DIAGNOSIS — Z20828 Contact with and (suspected) exposure to other viral communicable diseases: Secondary | ICD-10-CM | POA: Diagnosis present

## 2018-07-01 DIAGNOSIS — K2980 Duodenitis without bleeding: Secondary | ICD-10-CM | POA: Diagnosis present

## 2018-07-01 DIAGNOSIS — K922 Gastrointestinal hemorrhage, unspecified: Secondary | ICD-10-CM | POA: Diagnosis present

## 2018-07-01 LAB — BASIC METABOLIC PANEL
Anion gap: 19 — ABNORMAL HIGH (ref 5–15)
BUN: 27 mg/dL — ABNORMAL HIGH (ref 6–20)
CO2: 18 mmol/L — ABNORMAL LOW (ref 22–32)
Calcium: 9 mg/dL (ref 8.9–10.3)
Chloride: 105 mmol/L (ref 98–111)
Creatinine, Ser: 1.78 mg/dL — ABNORMAL HIGH (ref 0.61–1.24)
GFR calc Af Amer: 47 mL/min — ABNORMAL LOW (ref >60)
GFR calc non Af Amer: 41 mL/min — ABNORMAL LOW (ref >60)
Glucose, Bld: 146 mg/dL — ABNORMAL HIGH (ref 70–99)
Potassium: 4.2 mmol/L (ref 3.5–5.1)
Sodium: 142 mmol/L (ref 135–145)

## 2018-07-01 LAB — PROTIME-INR
INR: 1 (ref 0.8–1.2)
Prothrombin Time: 12.8 seconds (ref 11.4–15.2)

## 2018-07-01 LAB — SARS CORONAVIRUS 2 BY RT PCR (HOSPITAL ORDER, PERFORMED IN ~~LOC~~ HOSPITAL LAB): SARS Coronavirus 2: NEGATIVE

## 2018-07-01 LAB — CBC
HCT: 29.3 % — ABNORMAL LOW (ref 39.0–52.0)
Hemoglobin: 9.3 g/dL — ABNORMAL LOW (ref 13.0–17.0)
MCH: 29.9 pg (ref 26.0–34.0)
MCHC: 31.7 g/dL (ref 30.0–36.0)
MCV: 94.2 fL (ref 80.0–100.0)
Platelets: 184 10*3/uL (ref 150–400)
RBC: 3.11 MIL/uL — ABNORMAL LOW (ref 4.22–5.81)
RDW: 17.4 % — ABNORMAL HIGH (ref 11.5–15.5)
WBC: 4.5 10*3/uL (ref 4.0–10.5)
nRBC: 0.9 % — ABNORMAL HIGH (ref 0.0–0.2)

## 2018-07-01 LAB — HEPATIC FUNCTION PANEL
ALT: 38 U/L (ref 0–44)
AST: 80 U/L — ABNORMAL HIGH (ref 15–41)
Albumin: 4.2 g/dL (ref 3.5–5.0)
Alkaline Phosphatase: 102 U/L (ref 38–126)
Bilirubin, Direct: <0.1 mg/dL (ref 0.0–0.2)
Total Bilirubin: 0.5 mg/dL (ref 0.3–1.2)
Total Protein: 8 g/dL (ref 6.5–8.1)

## 2018-07-01 LAB — ETHANOL: Alcohol, Ethyl (B): 473 mg/dL (ref <10)

## 2018-07-01 LAB — TROPONIN I: Troponin I: <0.03 ng/mL (ref <0.03)

## 2018-07-01 LAB — LIPASE, BLOOD: Lipase: 40 U/L (ref 11–51)

## 2018-07-01 MED ORDER — AMLODIPINE BESYLATE 5 MG PO TABS
5.0000 mg | ORAL_TABLET | Freq: Every day | ORAL | Status: DC
  Administered 2018-07-02: 10:00:00 5 mg via ORAL
  Filled 2018-07-01: qty 1

## 2018-07-01 MED ORDER — PROMETHAZINE HCL 25 MG/ML IJ SOLN
12.5000 mg | Freq: Four times a day (QID) | INTRAMUSCULAR | Status: DC | PRN
  Administered 2018-07-01: 12.5 mg via INTRAVENOUS
  Filled 2018-07-01: qty 1

## 2018-07-01 MED ORDER — LORAZEPAM 2 MG/ML IJ SOLN: 1.0000 mg | Freq: Four times a day (QID) | INTRAMUSCULAR | Status: DC | PRN

## 2018-07-01 MED ORDER — SODIUM CHLORIDE 0.9% FLUSH
3.0000 mL | Freq: Two times a day (BID) | INTRAVENOUS | Status: DC
  Administered 2018-07-02: 10:00:00 3 mL via INTRAVENOUS

## 2018-07-01 MED ORDER — ONDANSETRON HCL 4 MG/2ML IJ SOLN: 4.0000 mg | Freq: Four times a day (QID) | INTRAMUSCULAR | Status: DC | PRN

## 2018-07-01 MED ORDER — VITAMIN B-1 100 MG PO TABS: 100.0000 mg | ORAL_TABLET | Freq: Every day | ORAL | Status: DC

## 2018-07-01 MED ORDER — ZIPRASIDONE MESYLATE 20 MG IM SOLR: 20.0000 mg | Freq: Once | INTRAMUSCULAR | Status: DC

## 2018-07-01 MED ORDER — ALLOPURINOL 100 MG PO TABS
300.0000 mg | ORAL_TABLET | Freq: Every day | ORAL | Status: DC
  Administered 2018-07-02: 10:00:00 300 mg via ORAL
  Filled 2018-07-01: qty 1
  Filled 2018-07-01: qty 3

## 2018-07-01 MED ORDER — ACETAMINOPHEN 650 MG RE SUPP: 650.0000 mg | Freq: Four times a day (QID) | RECTAL | Status: DC | PRN

## 2018-07-01 MED ORDER — PANTOPRAZOLE SODIUM 40 MG IV SOLR: 40.0000 mg | Freq: Two times a day (BID) | INTRAVENOUS | Status: DC

## 2018-07-01 MED ORDER — INSULIN ASPART 100 UNIT/ML ~~LOC~~ SOLN: 0.0000 [IU] | Freq: Three times a day (TID) | SUBCUTANEOUS | Status: DC

## 2018-07-01 MED ORDER — ACETAMINOPHEN 325 MG PO TABS: 650.0000 mg | ORAL_TABLET | Freq: Four times a day (QID) | ORAL | Status: DC | PRN

## 2018-07-01 MED ORDER — SODIUM CHLORIDE 0.9 % IV SOLN
80.0000 mg | Freq: Once | INTRAVENOUS | Status: AC
  Administered 2018-07-01: 80 mg via INTRAVENOUS
  Filled 2018-07-01: qty 80

## 2018-07-01 MED ORDER — LOSARTAN POTASSIUM 50 MG PO TABS
100.0000 mg | ORAL_TABLET | Freq: Every day | ORAL | Status: DC
  Administered 2018-07-02: 100 mg via ORAL
  Filled 2018-07-01: qty 2

## 2018-07-01 MED ORDER — FOLIC ACID 1 MG PO TABS: 1.0000 mg | ORAL_TABLET | Freq: Every day | ORAL | Status: DC

## 2018-07-01 MED ORDER — ADULT MULTIVITAMIN W/MINERALS CH: 1.0000 | ORAL_TABLET | Freq: Every day | ORAL | Status: DC

## 2018-07-01 MED ORDER — SODIUM CHLORIDE 0.9 % IV SOLN
INTRAVENOUS | Status: DC
  Administered 2018-07-01 – 2018-07-02 (×2): via INTRAVENOUS

## 2018-07-01 MED ORDER — PROPRANOLOL HCL ER 60 MG PO CP24
60.0000 mg | ORAL_CAPSULE | Freq: Every day | ORAL | Status: DC
  Administered 2018-07-02: 10:00:00 60 mg via ORAL
  Filled 2018-07-01: qty 1

## 2018-07-01 MED ORDER — GABAPENTIN 300 MG PO CAPS: 300.0000 mg | ORAL_CAPSULE | Freq: Two times a day (BID) | ORAL | Status: DC

## 2018-07-01 MED ORDER — FOLIC ACID 1 MG PO TABS
1.0000 mg | ORAL_TABLET | Freq: Every day | ORAL | Status: DC
  Administered 2018-07-02: 10:00:00 1 mg via ORAL
  Filled 2018-07-01: qty 1

## 2018-07-01 MED ORDER — LISINOPRIL 10 MG PO TABS: 10.0000 mg | ORAL_TABLET | Freq: Every day | ORAL | Status: DC

## 2018-07-01 MED ORDER — METFORMIN HCL ER 500 MG PO TB24: 1000.0000 mg | ORAL_TABLET | Freq: Every day | ORAL | Status: DC

## 2018-07-01 MED ORDER — LORAZEPAM 1 MG PO TABS: 1.0000 mg | ORAL_TABLET | Freq: Four times a day (QID) | ORAL | Status: DC | PRN

## 2018-07-01 MED ORDER — THIAMINE HCL 100 MG/ML IJ SOLN
100.0000 mg | Freq: Every day | INTRAMUSCULAR | Status: DC
  Administered 2018-07-02: 10:00:00 100 mg via INTRAVENOUS
  Filled 2018-07-01: qty 2

## 2018-07-01 MED ORDER — INSULIN ASPART 100 UNIT/ML ~~LOC~~ SOLN: 0.0000 [IU] | Freq: Every day | SUBCUTANEOUS | Status: DC

## 2018-07-01 MED ORDER — ATORVASTATIN CALCIUM 20 MG PO TABS: 40.0000 mg | ORAL_TABLET | Freq: Every day | ORAL | Status: DC

## 2018-07-01 MED ORDER — ONDANSETRON HCL 4 MG PO TABS: 4.0000 mg | ORAL_TABLET | Freq: Four times a day (QID) | ORAL | Status: DC | PRN

## 2018-07-01 NOTE — Progress Notes (Signed)
Patient seen and evaluated with nurse practitioner Marylene Land sids NP Presented with melanotic stool and has history of alcohol abuse Patient needs evaluation by gastroenterology and possible endoscopy Needs to be on proton pump inhibitor Needs CIWA protocol for alcohol withdrawal Alcohol cessation counseling given

## 2018-07-01 NOTE — ED Notes (Signed)
Spoke with Charge RN Raquel about patient being high fall risk from wheelchair. Until open room, patient will be placed near nurses in triage to keep an eye on him.

## 2018-07-01 NOTE — H&P (Signed)
[facility] -  at [facility]   PATIENT NAME: George Gomez    MR#:  [id number]  DATE OF BIRTH:  12/03/1960  DATE OF ADMISSION:  07/01/2018  PRIMARY CARE PHYSICIAN: Blair Heysampbell, Thane, MD   REQUESTING/REFERRING PHYSICIAN: Willy EddyPatrick Robinson, MD  CHIEF COMPLAINT:   Chief Complaint  Patient presents with  . Alcohol Intoxication  . Chest Pain    HISTORY OF PRESENT ILLNESS:  George Gomez  is a 58 y.o. male with a known history of EtOH abuse, anemia, diabetes mellitus, hypertension, hyperlipidemia, diverticulitis, B12 deficiency, pancytopenia.  Patient underwent EGD on 06/01/2017 demonstrating normal esophagus, normal stomach, and reactive duodenitis.  Today he presents the emergency room complaining of a one-week history of aching epigastric pain as well as intermittent black stools.  Guaiac was positive for blood.  Hemoglobin on arrival is 9.3 with hematocrit 29.3.  Patient continues to drink 1/2 to 1 pint liquor every day.  He reportedly continues to receive B12 injections at the oncology clinic.  He denies vomiting however he has occasional episodes of nausea which are usually present in the a.m.  He denies chest pain or shortness of breath.  Patient denies edema.  He denies cough.  He denies fevers or chills.  He denies hematemesis.  On his arrival, BUN is 27 with creatinine 1.78.  He has no known past history of chronic renal failure.  He denies dysuria, urgency, frequency.  He denies hematuria.  We have admitted him to the hospitalist service for further management.    PAST MEDICAL HISTORY:   Past Medical History:  Diagnosis Date  . Anemia   . Diabetes mellitus without complication (HCC)   . Diverticulitis    Pt states diverticulitis  . EtOH dependence (HCC)   . Hypercholesteremia   . Hypertension     PAST SURGICAL HISTORY:   Past Surgical History:  Procedure Laterality Date  . ESOPHAGOGASTRODUODENOSCOPY (EGD) WITH PROPOFOL N/A 06/01/2017    Procedure: ESOPHAGOGASTRODUODENOSCOPY (EGD) WITH PROPOFOL;  Surgeon: Wyline MoodAnna, Kiran, MD;  Location: [facility] ENDOSCOPY;  Service: Gastroenterology;  Laterality: N/A;  . none      SOCIAL HISTORY:   Social History   Tobacco Use  . Smoking status: Never Smoker  . Smokeless tobacco: Never Used  Substance Use Topics  . Alcohol use: Yes    Alcohol/week: 38.0 standard drinks    Types: 32 Cans of beer, 6 Shots of liquor per week    Comment: 2-3 drinks/day    FAMILY HISTORY:   Family History  Problem Relation Age of Onset  . Rheum arthritis Mother   . Diabetes Father   . Heart disease Father   . Cancer Father   . Cancer Maternal Grandfather   . Cancer Paternal Grandfather     DRUG ALLERGIES:  No Known Allergies  REVIEW OF SYSTEMS:   Review of Systems  Constitutional: Negative for chills, diaphoresis, fever, malaise/fatigue and weight loss.  HENT: Negative for congestion, nosebleeds and sinus pain.   Eyes: Negative for blurred vision and double vision.  Respiratory: Negative for cough, hemoptysis, sputum production and shortness of breath.   Cardiovascular: Negative for chest pain, palpitations and leg swelling.  Gastrointestinal: Positive for abdominal pain (epigastric) and melena (Over the last week). Negative for constipation, diarrhea, heartburn, nausea and vomiting.  Genitourinary: Negative for dysuria, flank pain, hematuria and urgency.  Musculoskeletal: Negative for falls, joint pain, myalgias and neck pain.  Skin: Negative for itching and rash.  Neurological: Negative for dizziness, loss of consciousness, weakness  and headaches.  Psychiatric/Behavioral: Positive for substance abuse (drinks1/2 to 1 pint per day).     MEDICATIONS AT HOME:   Prior to Admission medications   Medication Sig Start Date End Date Taking? Authorizing Provider  allopurinol (ZYLOPRIM) 300 MG tablet Take 300 mg by mouth daily.  05/28/18  Yes [provider]  amLODipine (NORVASC) 5 MG tablet  Take 5 mg by mouth daily.  06/29/18  Yes [provider]  atorvastatin (LIPITOR) 40 MG tablet Take 40 mg by mouth daily at 6 PM.  06/02/18  Yes [provider]  losartan (COZAAR) 100 MG tablet Take 100 mg by mouth daily.  06/29/18  Yes [provider]  naltrexone (DEPADE) 50 MG tablet Take 50 mg by mouth daily.  06/15/18  Yes [provider]  propranolol ER (INDERAL LA) 60 MG 24 hr capsule Take 60 mg by mouth daily.  06/29/18  Yes [provider]  gabapentin (NEURONTIN) 300 MG capsule Take 300 mg by mouth 2 (two) times daily. 02/18/16   [provider]  lisinopril (PRINIVIL,ZESTRIL) 10 MG tablet Take 1 tablet by mouth daily. 05/19/17   [provider]  metFORMIN (GLUCOPHAGE-XR) 500 MG 24 hr tablet Take 2 tablets by mouth daily. 03/05/16   [provider]      VITAL SIGNS:  Blood pressure 128/81, pulse (!) 106, temperature 98.7 F (37.1 C), temperature source Oral, resp. rate 18, height 6\' 11"  (2.108 m), weight 83.9 kg, SpO2 94 %.  PHYSICAL EXAMINATION:  Physical Exam Constitutional:      General: He is not in acute distress.    Appearance: He is well-developed. He is not ill-appearing.  HENT:     Head: Normocephalic.  Eyes:     Extraocular Movements: Extraocular movements intact.     Pupils: Pupils are equal, round, and reactive to light.  Neck:     Musculoskeletal: Normal range of motion and neck supple.     Vascular: No JVD.  Cardiovascular:     Rate and Rhythm: Normal rate and regular rhythm.     Pulses:          Carotid pulses are 2+ on the right side and 2+ on the left side.      Radial pulses are 2+ on the right side and 2+ on the left side.       Dorsalis pedis pulses are 2+ on the right side and 2+ on the left side.       Posterior tibial pulses are 2+ on the right side and 2+ on the left side.     Heart sounds: Normal heart sounds. No murmur. No friction rub. No gallop.   Pulmonary:     Effort: Pulmonary effort  is normal. No respiratory distress.     Breath sounds: Normal breath sounds. No decreased breath sounds, wheezing, rhonchi or rales.  Chest:     Chest wall: No tenderness.  Abdominal:     General: Bowel sounds are normal.     Palpations: Abdomen is soft.     Tenderness: There is abdominal tenderness (epigastric). There is no guarding or rebound.  Genitourinary:    Rectum: Guaiac result positive (Positive).  Musculoskeletal: Normal range of motion.     Right lower leg: He exhibits no tenderness. No edema.     Left lower leg: He exhibits no tenderness. No edema.  Lymphadenopathy:     Cervical: No cervical adenopathy.  Skin:    General: Skin is warm and dry.  Capillary Refill: Capillary refill takes less than 2 seconds.     Findings: No erythema or rash.  Neurological:     General: No focal deficit present.     Mental Status: He is alert and oriented to person, place, and time.     Motor: No weakness.  Psychiatric:        Mood and Affect: Mood normal.        Behavior: Behavior normal.       LABORATORY PANEL:   CBC Recent Labs  Lab 07/01/18 1845  WBC 4.5  HGB 9.3*  HCT 29.3*  PLT 184   ------------------------------------------------------------------------------------------------------------------  Chemistries  Recent Labs  Lab 07/01/18 1845  NA 142  K 4.2  CL 105  CO2 18*  GLUCOSE 146*  BUN 27*  CREATININE 1.78*  CALCIUM 9.0  AST 80*  ALT 38  ALKPHOS 102  BILITOT 0.5   ------------------------------------------------------------------------------------------------------------------  Cardiac Enzymes Recent Labs  Lab 07/01/18 1845  TROPONINI <0.03   ------------------------------------------------------------------------------------------------------------------  RADIOLOGY:  Dg Chest 2 View  Result Date: 07/01/2018 CLINICAL DATA:  Chest pain EXAM: CHEST - 2 VIEW COMPARISON:  05/30/2017 FINDINGS: The lung volumes are low. There is some mild  volume overload. The heart size is stable from prior study but is borderline enlarged. There is no pneumothorax. There is some mild elevation of the right hemidiaphragm. IMPRESSION: Low lung volumes, otherwise no acute abnormality. Electronically Signed   By: Katherine Mantle M.D.   On: 07/01/2018 20:55      IMPRESSION AND PLAN:   1.   GI bleed - With positive guaiac stool - PPI therapy with Protonix IV twice daily - Continue to monitor hemoglobin hematocrit every 8 hours and transfuse if necessary - Gastroenterology consulted, Dr. Servando Snare, with patient history of duodenitis noted on EGD 06/01/2017  2.  Epigastric pain - Will treat with analgesic  3.  EtOH dependence - CIWA protocol initiated  4.  Acute kidney injury BUN 27 with creatinine 1.78 - Patient received 500 cc normal saline bolus on arrival currently with normal saline infusing to peripheral IV is only 5 cc/h. - Will repeat BMP in the a.m. and monitor renal function closely - May consider renal ultrasound if no improvement or if worse.  5.  Hypertension -Continue Norvasc, lisinopril, Cozaar - We will treat persistent hypertension expectantly - Telemetry monitoring  6.  Diabetes mellitus - Hold metformin with current acute renal failure - Moderate sliding scale insulin -Will get hemoglobin A1c in the a.m.  7. hyperlipidemia - Statin therapy resumed  8.  History of B12 deficiency - We will get B12 level in the a.m. and continue management accordingly  DVT prophylaxis with SCDs and PPI therapy initiated    All the records are reviewed and case discussed with ED provider. The plan of care was discussed in details with the patient (and family). I answered all questions. The patient agreed to proceed with the above mentioned plan. Further management will depend upon hospital course.   CODE STATUS: Full code  TOTAL TIME TAKING CARE OF THIS PATIENT: 45 minutes.    Milas Kocher Pepper Wyndham CRNP on 07/01/2018 at 10:50  PM  Pager - [phone]  After 6pm go to [email or web address] - Social research officer, government  [facility]  Office  [phone]  CC: Primary care physician; Blair Heys, MD   Note: This dictation was prepared with Dragon dictation along with smaller phrase technology. Any transcriptional errors that result from this process are unintentional.

## 2018-07-01 NOTE — ED Notes (Signed)
Report given to Debbie RN pt admitted to [facility]

## 2018-07-01 NOTE — ED Notes (Signed)
ED TO INPATIENT HANDOFF REPORT  ED Nurse Name and Phone #: Arryanna Holquin [phone]  S Name/Age/Gender George Gomez 58 y.o. male Room/Bed: ED19HA/ED19HA  Code Status   Code Status: Full Code  Home/SNF/Other Home Patient oriented to: self, place, time and situation Is this baseline? Yes   Triage Complete: Triage complete  Chief Complaint Chest Pain  Triage Note Patient arrived with friend c/o chest pains. When back in triage room patient is very intoxicated and one minute stating he has chest pain and the next saying "I dont have no alcohol pain. I cannot sleep" Patient cannot hold a conversation, jumping from one topic to another.    Allergies No Known Allergies  Level of Care/Admitting Diagnosis ED Disposition    ED Disposition Condition Comment   Admit  Hospital Area: [facility] [[id number]]  Level of Care: Med-Surg [16]  Covid Evaluation: Confirmed COVID Negative  Diagnosis: GI bleed [[id number]]  Admitting Physician: Ihor Austin [[id number]]  Attending Physician: Ihor Austin [[id number]]  Estimated length of stay: past midnight tomorrow  Certification:: I certify this patient will need inpatient services for at least 2 midnights  PT Class (Do Not Modify): Inpatient [101]  PT Acc Code (Do Not Modify): Private [1]       B Medical/Surgery History Past Medical History:  Diagnosis Date  . Anemia   . Diabetes mellitus without complication (HCC)   . Diverticulitis    Pt states diverticulitis  . EtOH dependence (HCC)   . Hypercholesteremia   . Hypertension    Past Surgical History:  Procedure Laterality Date  . ESOPHAGOGASTRODUODENOSCOPY (EGD) WITH PROPOFOL N/A 06/01/2017   Procedure: ESOPHAGOGASTRODUODENOSCOPY (EGD) WITH PROPOFOL;  Surgeon: Wyline Mood, MD;  Location: [facility] ENDOSCOPY;  Service: Gastroenterology;  Laterality: N/A;  . none       A IV Location/Drains/Wounds Patient Lines/Drains/Airways Status   Active Line/Drains/Airways    Name:    Placement date:   Placement time:   Site:   Days:   Peripheral IV 07/01/18 Left Antecubital   07/01/18    2134    Antecubital   less than 1          Intake/Output Last 24 hours No intake or output data in the 24 hours ending 07/01/18 2254  Labs/Imaging Results for orders placed or performed during the hospital encounter of 07/01/18 (from the past 48 hour(s))  Basic metabolic panel     Status: Abnormal   Collection Time: 07/01/18  6:45 PM  Result Value Ref Range   Sodium 142 135 - 145 mmol/L   Potassium 4.2 3.5 - 5.1 mmol/L   Chloride 105 98 - 111 mmol/L   CO2 18 (L) 22 - 32 mmol/L   Glucose, Bld 146 (H) 70 - 99 mg/dL   BUN 27 (H) 6 - 20 mg/dL   Creatinine, Ser 7.84 (H) 0.61 - 1.24 mg/dL   Calcium 9.0 8.9 - 69.6 mg/dL   GFR calc non Af Amer 41 (L) >60 mL/min   GFR calc Af Amer 47 (L) >60 mL/min   Anion gap 19 (H) 5 - 15    Comment: Performed at [facility], [address] [place], [place] [address]  CBC     Status: Abnormal   Collection Time: 07/01/18  6:45 PM  Result Value Ref Range   WBC 4.5 4.0 - 10.5 K/uL   RBC 3.11 (L) 4.22 - 5.81 MIL/uL   Hemoglobin 9.3 (L) 13.0 - 17.0 g/dL   HCT 41.3 (L) 24.4 - 01.0 %  MCV 94.2 80.0 - 100.0 fL   MCH 29.9 26.0 - 34.0 pg   MCHC 31.7 30.0 - 36.0 g/dL   RDW 74.1 (H) 28.7 - 86.7 %   Platelets 184 150 - 400 K/uL   nRBC 0.9 (H) 0.0 - 0.2 %    Comment: Performed at [facility], [address] [place], [place] [address]  Troponin I - ONCE - STAT     Status: None   Collection Time: 07/01/18  6:45 PM  Result Value Ref Range   Troponin I <0.03 <0.03 ng/mL    Comment: Performed at [facility], [address] [place], [place] [address]  Protime-INR (order if Patient is taking Coumadin / Warfarin)     Status: None   Collection Time: 07/01/18  6:45 PM  Result Value Ref Range   Prothrombin Time 12.8 11.4 - 15.2 seconds   INR 1.0 0.8 - 1.2    Comment: (NOTE) INR goal varies based on device and disease  states. Performed at [facility], [address] [place], [place] [address]   Ethanol     Status: Abnormal   Collection Time: 07/01/18  6:45 PM  Result Value Ref Range   Alcohol, Ethyl (B) 473 (HH) <10 mg/dL    Comment: CRITICAL RESULT CALLED TO, READ BACK BY AND VERIFIED WITH AMY BOISVERT 07/01/18 @ 2204  MLK (NOTE) Lowest detectable limit for serum alcohol is 10 mg/dL. For medical purposes only. Performed at [facility], [address] [place], [place] [address]   Lipase, blood     Status: None   Collection Time: 07/01/18  6:45 PM  Result Value Ref Range   Lipase 40 11 - 51 U/L    Comment: Performed at [facility], [address] [place], [place] [address]  Hepatic function panel     Status: Abnormal   Collection Time: 07/01/18  6:45 PM  Result Value Ref Range   Total Protein 8.0 6.5 - 8.1 g/dL   Albumin 4.2 3.5 - 5.0 g/dL   AST 80 (H) 15 - 41 U/L   ALT 38 0 - 44 U/L   Alkaline Phosphatase 102 38 - 126 U/L   Total Bilirubin 0.5 0.3 - 1.2 mg/dL   Bilirubin, Direct <3.5 0.0 - 0.2 mg/dL   Indirect Bilirubin NOT CALCULATED 0.3 - 0.9 mg/dL    Comment: Performed at [facility], [address] [place], [place] [address]  Type and screen [facility]     Status: None (Preliminary result)   Collection Time: 07/01/18  9:25 PM  Result Value Ref Range   ABO/RH(D) PENDING    Antibody Screen PENDING    Sample Expiration      07/04/2018,2359 Performed at [facility], [address], [place], [place] [address]   SARS Coronavirus 2 (CEPHEID - Performed in [facility] hospital lab), Hosp Order     Status: None   Collection Time: 07/01/18  9:25 PM  Result Value Ref Range   SARS Coronavirus 2 NEGATIVE NEGATIVE    Comment: (NOTE) If result is NEGATIVE SARS-CoV-2 target nucleic acids are NOT DETECTED. The SARS-CoV-2 RNA is generally detectable in upper and lower  respiratory specimens during the acute phase of  infection. The lowest  concentration of SARS-CoV-2 viral copies this assay can detect is 250  copies / mL. A negative result does not preclude SARS-CoV-2 infection  and should not be used as the sole basis for treatment or other  patient management decisions.  A negative result may occur with  improper specimen collection / handling, submission of specimen other  than nasopharyngeal swab, presence of viral mutation(s) within the  areas targeted by this assay, and inadequate number of viral copies  (<250 copies / mL). A negative result must be combined with clinical  observations, patient history, and epidemiological information. If result is POSITIVE SARS-CoV-2 target nucleic acids are DETECTED. The SARS-CoV-2 RNA is generally detectable in upper and lower  respiratory specimens dur ing the acute phase of infection.  Positive  results are indicative of active infection with SARS-CoV-2.  Clinical  correlation with patient history and other diagnostic information is  necessary to determine patient infection status.  Positive results do  not rule out bacterial infection or co-infection with other viruses. If result is PRESUMPTIVE POSTIVE SARS-CoV-2 nucleic acids MAY BE PRESENT.   A presumptive positive result was obtained on the submitted specimen  and confirmed on repeat testing.  While 2019 novel coronavirus  (SARS-CoV-2) nucleic acids may be present in the submitted sample  additional confirmatory testing may be necessary for epidemiological  and / or clinical management purposes  to differentiate between  SARS-CoV-2 and other Sarbecovirus currently known to infect humans.  If clinically indicated additional testing with an alternate test  methodology [phone]) is advised. The SARS-CoV-2 RNA is generally  detectable in upper and lower respiratory sp ecimens during the acute  phase of infection. The expected result is Negative. Fact Sheet for Patients:   BoilerBrush.com.cy Fact Sheet for Healthcare Providers: https://pope.com/ This test is not yet approved or cleared by the [place] FDA and has been authorized for detection and/or diagnosis of SARS-CoV-2 by FDA under an Emergency Use Authorization (EUA).  This EUA will remain in effect (meaning this test can be used) for the duration of the COVID-19 declaration under Section 564(b)(1) of the Act, 21 U.S.C. section 360bbb-3(b)(1), unless the authorization is terminated or revoked sooner. Performed at [facility], [address] [place], [place] [address]    Dg Chest 2 View  Result Date: 07/01/2018 CLINICAL DATA:  Chest pain EXAM: CHEST - 2 VIEW COMPARISON:  05/30/2017 FINDINGS: The lung volumes are low. There is some mild volume overload. The heart size is stable from prior study but is borderline enlarged. There is no pneumothorax. There is some mild elevation of the right hemidiaphragm. IMPRESSION: Low lung volumes, otherwise no acute abnormality. Electronically Signed   By: Katherine Mantle M.D.   On: 07/01/2018 20:55    Pending Labs Unresulted Labs (From admission, onward)    Start     Ordered   07/02/18 0500  Basic metabolic panel  Tomorrow morning,   STAT     07/01/18 2230   07/02/18 0500  CBC  Tomorrow morning,   STAT     07/01/18 2230   07/02/18 0000  Hemoglobin and hematocrit, blood  Now then every 8 hours,   STAT     07/01/18 2244   07/01/18 2226  Hepatic function panel  Once,   STAT     07/01/18 2230   07/01/18 2226  Hemoglobin A1c  Once,   STAT     07/01/18 2230   07/01/18 2215  Type and screen  Once,   STAT     07/01/18 2215          Vitals/Pain Today's Vitals   07/01/18 1836  BP: 128/81  Pulse: (!) 106  Resp: 18  Temp: 98.7 F (37.1 C)  TempSrc: Oral  SpO2: 94%  Weight: 83.9 kg  Height: 6\' 11"  (2.108 m)  PainSc: 9     Isolation Precautions No active  isolations  Medications Medications  promethazine (PHENERGAN) injection 12.5 mg (12.5 mg Intravenous Given 07/01/18 2134)  sodium chloride flush (NS) 0.9 % injection 3 mL (has no administration in time range)  0.9 %  sodium chloride infusion (has no administration in time range)  acetaminophen (TYLENOL) tablet 650 mg (has no administration in time range)    Or  acetaminophen (TYLENOL) suppository 650 mg (has no administration in time range)  ondansetron (ZOFRAN) tablet 4 mg (has no administration in time range)    Or  ondansetron (ZOFRAN) injection 4 mg (has no administration in time range)  multivitamin with minerals tablet 1 tablet (has no administration in time range)  pantoprazole (PROTONIX) injection 40 mg (has no administration in time range)  insulin aspart (novoLOG) injection 0-5 Units (has no administration in time range)  insulin aspart (novoLOG) injection 0-15 Units (has no administration in time range)  LORazepam (ATIVAN) tablet 1 mg (has no administration in time range)    Or  LORazepam (ATIVAN) injection 1 mg (has no administration in time range)  thiamine (VITAMIN B-1) tablet 100 mg (has no administration in time range)    Or  thiamine (B-1) injection 100 mg (has no administration in time range)  folic acid (FOLVITE) tablet 1 mg (has no administration in time range)  multivitamin with minerals tablet 1 tablet (has no administration in time range)  pantoprazole (PROTONIX) 80 mg in sodium chloride 0.9 % 100 mL IVPB (80 mg Intravenous New Bag/Given 07/01/18 2215)    Mobility walks High fall risk   Focused Assessments Abd pain, GI bleed   R Recommendations: See Admitting Provider Note  Report given to:   Additional Notes: Pt here for alcohol intoxication and GI bleed

## 2018-07-01 NOTE — ED Triage Notes (Signed)
Patient arrived with friend c/o chest pains. When back in triage room patient is very intoxicated and one minute stating he has chest pain and the next saying "I dont have no alcohol pain. I cannot sleep" Patient cannot hold a conversation, jumping from one topic to another.

## 2018-07-01 NOTE — Progress Notes (Signed)
Advanced care plan. Purpose of the Encounter: CODE STATUS Parties in Attendance: Patient Patient's Decision Capacity: Good Subjective/Patient's story: Presented to the emergency room for black melanotic stool Patient has a history of alcohol abuse and dependence and has history of duodenitis in the past.  Currently presented with epigastric pain and black stool. Objective/Medical story Patient needs gastroenterology evaluation and endoscopy for further work-up PRBC transfusion if needed.  Patient needs CIWA protocol for alcohol withdrawal Goals of care determination:  Advance care directives goals of care treatment plan discussed Patient wants CPR, intubation ventilator if the need arises CODE STATUS: Full resuscitation Time spent discussing advanced care planning: 16 minutes

## 2018-07-01 NOTE — ED Provider Notes (Signed)
[facility] Emergency Department Provider Note    First MD Initiated Contact with Patient 07/01/18 2030     (approximate)  I have reviewed the triage vital signs and the nursing notes.   HISTORY  Chief Complaint Alcohol Intoxication and Chest Pain    HPI George Gomez is a 58 y.o. male below listed past medical history with extensive history of alcohol dependence with previous admission to hospital for duodenitis presents the ER for burning epigastric discomfort intermittent dark black-colored stools with nausea but no vomiting.  States he has had several episodes of black tar colored diarrhea this week.  Patient clearly intoxicated.  Somewhat poor historian.   Past Medical History:  Diagnosis Date  . Anemia   . Diabetes mellitus without complication (HCC)   . Diverticulitis    Pt states diverticulitis  . EtOH dependence (HCC)   . Hypercholesteremia   . Hypertension    Family History  Problem Relation Age of Onset  . Rheum arthritis Mother   . Diabetes Father   . Heart disease Father   . Cancer Father   . Cancer Maternal Grandfather   . Cancer Paternal Grandfather    Past Surgical History:  Procedure Laterality Date  . ESOPHAGOGASTRODUODENOSCOPY (EGD) WITH PROPOFOL N/A 06/01/2017   Procedure: ESOPHAGOGASTRODUODENOSCOPY (EGD) WITH PROPOFOL;  Surgeon: Wyline MoodAnna, Kiran, MD;  Location[facility] ENDOSCOPY;  Service: Gastroenterology;  Laterality: N/A;  . none     Patient Active Problem List   Diagnosis Date Noted  . B12 deficiency 12/11/2017  . Normocytic anemia 12/03/2017  . Thrombocytopenia (HCC) 12/03/2017  . Leukopenia 12/03/2017  . GI bleed 05/30/2017  . Acute renal failure (ARF) (HCC) 08/28/2016  . Acute on chronic renal failure (HCC) 03/20/2016  . Dehydration 03/20/2016  . Lactic acidosis 03/20/2016  . Hyponatremia 03/20/2016  . Alcohol abuse 03/20/2016  . Clostridium difficile diarrhea 03/18/2016      Prior to Admission medications    Medication Sig Start Date End Date Taking? Authorizing Provider  allopurinol (ZYLOPRIM) 300 MG tablet Take 300 mg by mouth daily.  05/28/18  Yes [provider]  amLODipine (NORVASC) 5 MG tablet Take 5 mg by mouth daily.  06/29/18  Yes [provider]  atorvastatin (LIPITOR) 40 MG tablet Take 40 mg by mouth daily at 6 PM.  06/02/18  Yes [provider]  losartan (COZAAR) 100 MG tablet Take 100 mg by mouth daily.  06/29/18  Yes [provider]  naltrexone (DEPADE) 50 MG tablet Take 50 mg by mouth daily.  06/15/18  Yes [provider]  propranolol ER (INDERAL LA) 60 MG 24 hr capsule Take 60 mg by mouth daily.  06/29/18  Yes [provider]  gabapentin (NEURONTIN) 300 MG capsule Take 300 mg by mouth 2 (two) times daily. 02/18/16   [provider]  lisinopril (PRINIVIL,ZESTRIL) 10 MG tablet Take 1 tablet by mouth daily. 05/19/17   [provider]  metFORMIN (GLUCOPHAGE-XR) 500 MG 24 hr tablet Take 2 tablets by mouth daily. 03/05/16   [provider]    Allergies Patient has no known allergies.    Social History Social History   Tobacco Use  . Smoking status: Never Smoker  . Smokeless tobacco: Never Used  Substance Use Topics  . Alcohol use: Yes    Alcohol/week: 38.0 standard drinks    Types: 32 Cans of beer, 6 Shots of liquor per week    Comment: 2-3 drinks/day  . Drug use: No    Review  of Systems Patient denies headaches, rhinorrhea, blurry vision, numbness, shortness of breath, chest pain, edema, cough, abdominal pain, nausea, vomiting, diarrhea, dysuria, fevers, rashes or hallucinations unless otherwise stated above in HPI. ____________________________________________   PHYSICAL EXAM:  VITAL SIGNS: Vitals:   07/01/18 1836  BP: 128/81  Pulse: (!) 106  Resp: 18  Temp: 98.7 F (37.1 C)  SpO2: 94%    Constitutional: Alert , heavily intoxicated but protecting his airway Eyes: Conjunctivae are normal.   Head: Atraumatic. Nose: No congestion/rhinnorhea. Mouth/Throat: Mucous membranes are moist.   Neck: No stridor. Painless ROM.  Cardiovascular: Normal rate, regular rhythm. Grossly normal heart sounds.  Good peripheral circulation. Respiratory: Normal respiratory effort.  No retractions. Lungs CTAB. Gastrointestinal: Soft and nontender. No distention. No abdominal bruits. No CVA tenderness. Genitourinary:  Musculoskeletal: No lower extremity tenderness nor edema.  No joint effusions. Neurologic:  slurred speech and language. No gross focal neurologic deficits are appreciated. No facial droop Skin:  Skin is warm, dry and intact. No rash noted. Psychiatric: intoxicated  ____________________________________________   LABS (all labs ordered are listed, but only abnormal results are displayed)  Results for orders placed or performed during the hospital encounter of 07/01/18 (from the past 24 hour(s))  Basic metabolic panel     Status: Abnormal   Collection Time: 07/01/18  6:45 PM  Result Value Ref Range   Sodium 142 135 - 145 mmol/L   Potassium 4.2 3.5 - 5.1 mmol/L   Chloride 105 98 - 111 mmol/L   CO2 18 (L) 22 - 32 mmol/L   Glucose, Bld 146 (H) 70 - 99 mg/dL   BUN 27 (H) 6 - 20 mg/dL   Creatinine, Ser 1.03 (H) 0.61 - 1.24 mg/dL   Calcium 9.0 8.9 - 12.8 mg/dL   GFR calc non Af Amer 41 (L) >60 mL/min   GFR calc Af Amer 47 (L) >60 mL/min   Anion gap 19 (H) 5 - 15  CBC     Status: Abnormal   Collection Time: 07/01/18  6:45 PM  Result Value Ref Range   WBC 4.5 4.0 - 10.5 K/uL   RBC 3.11 (L) 4.22 - 5.81 MIL/uL   Hemoglobin 9.3 (L) 13.0 - 17.0 g/dL   HCT 11.8 (L) 86.7 - 73.7 %   MCV 94.2 80.0 - 100.0 fL   MCH 29.9 26.0 - 34.0 pg   MCHC 31.7 30.0 - 36.0 g/dL   RDW 36.6 (H) 81.5 - 94.7 %   Platelets 184 150 - 400 K/uL   nRBC 0.9 (H) 0.0 - 0.2 %  Troponin I - ONCE - STAT     Status: None   Collection Time: 07/01/18  6:45 PM  Result Value Ref Range   Troponin I <0.03 <0.03 ng/mL   Protime-INR (order if Patient is taking Coumadin / Warfarin)     Status: None   Collection Time: 07/01/18  6:45 PM  Result Value Ref Range   Prothrombin Time 12.8 11.4 - 15.2 seconds   INR 1.0 0.8 - 1.2  Ethanol     Status: Abnormal   Collection Time: 07/01/18  6:45 PM  Result Value Ref Range   Alcohol, Ethyl (B) 473 (HH) <10 mg/dL  Lipase, blood     Status: None   Collection Time: 07/01/18  6:45 PM  Result Value Ref Range   Lipase 40 11 - 51 U/L  Hepatic function panel     Status: Abnormal   Collection Time: 07/01/18  6:45 PM  Result Value Ref  Range   Total Protein 8.0 6.5 - 8.1 g/dL   Albumin 4.2 3.5 - 5.0 g/dL   AST 80 (H) 15 - 41 U/L   ALT 38 0 - 44 U/L   Alkaline Phosphatase 102 38 - 126 U/L   Total Bilirubin 0.5 0.3 - 1.2 mg/dL   Bilirubin, Direct <1.6 0.0 - 0.2 mg/dL   Indirect Bilirubin NOT CALCULATED 0.3 - 0.9 mg/dL  Type and screen [facility]     Status: None (Preliminary result)   Collection Time: 07/01/18  9:25 PM  Result Value Ref Range   ABO/RH(D) PENDING    Antibody Screen PENDING    Sample Expiration      07/04/2018,2359 Performed at [facility], [address] [place], [place] [address]    ____________________________________________ ____________________________________________  RADIOLOGY   ____________________________________________   PROCEDURES  Procedure(s) performed:  Procedures    Critical Care performed: no ____________________________________________   INITIAL IMPRESSION / ASSESSMENT AND PLAN / ED COURSE  Pertinent labs & imaging results that were available during my care of the patient were reviewed by me and considered in my medical decision making (see chart for details).   DDX: Intoxication, electrolyte abnormality, anemia, dehydration, upper GI bleed, duodenitis, varices  George Gomez is a 58 y.o. who presents to the ED with symptoms as described above.  Patient heavily intoxicated however  patient does have blood work showing evidence of downtrending hemoglobin with report of intermittent melena.  No evidence of active or massive GI bleed at this time but given his heavy alcohol abuse with history of duodenitis will start on Protonix.  Clinical Course as of Jun 30 2213  Thu Jul 01, 2018  2210 Patient is heavily intoxicated.  Still mildly tachycardic likely secondary to   [PR]  2215 Discussed case with hospitalist who kindly agrees to the patient for further medical management.  Have discussed with the patient and available family all diagnostics and treatments performed thus far and all questions were answered to the best of my ability. The patient demonstrates understanding and agreement with plan.    [PR]    Clinical Course User Index [PR] Willy Eddy, MD    The patient was evaluated in Emergency Department today for the symptoms described in the history of present illness. He/she was evaluated in the context of the global COVID-19 pandemic, which necessitated consideration that the patient might be at risk for infection with the SARS-CoV-2 virus that causes COVID-19. Institutional protocols and algorithms that pertain to the evaluation of patients at risk for COVID-19 are in a state of rapid change based on information released by regulatory bodies including the CDC and federal and state organizations. These policies and algorithms were followed during the patient's care in the ED.  As part of my medical decision making, I reviewed the following data within the electronic MEDICAL RECORD NUMBER Nursing notes reviewed and incorporated, Labs reviewed, notes from prior ED visits and  Controlled Substance Database   ____________________________________________   FINAL CLINICAL IMPRESSION(S) / ED DIAGNOSES  Final diagnoses:  Epigastric pain  Melena  Alcohol dependence with intoxication with complication (HCC)      NEW MEDICATIONS STARTED DURING THIS VISIT:  New  Prescriptions   No medications on file     Note:  This document was prepared using Dragon voice recognition software and may include unintentional dictation errors.    Willy Eddy, MD 07/01/18 2215

## 2018-07-01 NOTE — ED Notes (Signed)
Pt states he brought himself to the hospital. "I drank too much."  Pt denies SI/HI. States he has pain in his neck. Pt speech is soft and slurred.

## 2018-07-01 NOTE — ED Notes (Signed)
Offered pt meal tray, pt refused.

## 2018-07-02 ENCOUNTER — Other Ambulatory Visit: Payer: Self-pay

## 2018-07-02 ENCOUNTER — Encounter
Admission: EM | Disposition: A | Discharge status: Home / Self Care | Payer: Self-pay | Source: Home / Self Care | Attending: Specialist

## 2018-07-02 ENCOUNTER — Inpatient Hospital Stay: Payer: Medicare Other | Admitting: Certified Registered"

## 2018-07-02 ENCOUNTER — Encounter: Payer: Self-pay | Admitting: Gastroenterology

## 2018-07-02 ENCOUNTER — Telehealth: Payer: Self-pay | Admitting: Hematology and Oncology

## 2018-07-02 DIAGNOSIS — K766 Portal hypertension: Principal | ICD-10-CM

## 2018-07-02 DIAGNOSIS — K921 Melena: Secondary | ICD-10-CM

## 2018-07-02 DIAGNOSIS — R1013 Epigastric pain: Secondary | ICD-10-CM

## 2018-07-02 HISTORY — PX: ESOPHAGOGASTRODUODENOSCOPY: SHX5428

## 2018-07-02 LAB — TYPE AND SCREEN
ABO/RH(D): O POS
Antibody Screen: NEGATIVE

## 2018-07-02 LAB — HEPATIC FUNCTION PANEL
ALT: 35 U/L (ref 0–44)
AST: 66 U/L — ABNORMAL HIGH (ref 15–41)
Albumin: 4.1 g/dL (ref 3.5–5.0)
Alkaline Phosphatase: 101 U/L (ref 38–126)
Bilirubin, Direct: <0.1 mg/dL (ref 0.0–0.2)
Total Bilirubin: 0.3 mg/dL (ref 0.3–1.2)
Total Protein: 7.6 g/dL (ref 6.5–8.1)

## 2018-07-02 LAB — CBC
HCT: 26.1 % — ABNORMAL LOW (ref 39.0–52.0)
Hemoglobin: 8.4 g/dL — ABNORMAL LOW (ref 13.0–17.0)
MCH: 29.9 pg (ref 26.0–34.0)
MCHC: 32.2 g/dL (ref 30.0–36.0)
MCV: 92.9 fL (ref 80.0–100.0)
Platelets: 154 10*3/uL (ref 150–400)
RBC: 2.81 MIL/uL — ABNORMAL LOW (ref 4.22–5.81)
RDW: 17.2 % — ABNORMAL HIGH (ref 11.5–15.5)
WBC: 2.8 10*3/uL — ABNORMAL LOW (ref 4.0–10.5)
nRBC: 0 % (ref 0.0–0.2)

## 2018-07-02 LAB — HEMOGLOBIN AND HEMATOCRIT, BLOOD
HCT: 26.4 % — ABNORMAL LOW (ref 39.0–52.0)
HCT: 27.1 % — ABNORMAL LOW (ref 39.0–52.0)
HCT: 28.3 % — ABNORMAL LOW (ref 39.0–52.0)
Hemoglobin: 8.6 g/dL — ABNORMAL LOW (ref 13.0–17.0)
Hemoglobin: 8.7 g/dL — ABNORMAL LOW (ref 13.0–17.0)
Hemoglobin: 9.2 g/dL — ABNORMAL LOW (ref 13.0–17.0)

## 2018-07-02 LAB — VITAMIN B12: Vitamin B-12: 593 pg/mL (ref 180–914)

## 2018-07-02 LAB — BASIC METABOLIC PANEL
Anion gap: 14 (ref 5–15)
BUN: 26 mg/dL — ABNORMAL HIGH (ref 6–20)
CO2: 21 mmol/L — ABNORMAL LOW (ref 22–32)
Calcium: 8.4 mg/dL — ABNORMAL LOW (ref 8.9–10.3)
Chloride: 108 mmol/L (ref 98–111)
Creatinine, Ser: 1.25 mg/dL — ABNORMAL HIGH (ref 0.61–1.24)
GFR calc Af Amer: >60 mL/min (ref >60)
GFR calc non Af Amer: >60 mL/min (ref >60)
Glucose, Bld: 92 mg/dL (ref 70–99)
Potassium: 4.2 mmol/L (ref 3.5–5.1)
Sodium: 143 mmol/L (ref 135–145)

## 2018-07-02 LAB — IRON AND TIBC
Iron: 103 ug/dL (ref 45–182)
Saturation Ratios: 45 % — ABNORMAL HIGH (ref 17.9–39.5)
TIBC: 230 ug/dL — ABNORMAL LOW (ref 250–450)
UIBC: 127 ug/dL

## 2018-07-02 LAB — GLUCOSE, CAPILLARY
Glucose-Capillary: 80 mg/dL (ref 70–99)
Glucose-Capillary: 93 mg/dL (ref 70–99)
Glucose-Capillary: 99 mg/dL (ref 70–99)

## 2018-07-02 LAB — FERRITIN: Ferritin: 620 ng/mL — ABNORMAL HIGH (ref 24–336)

## 2018-07-02 SURGERY — EGD (ESOPHAGOGASTRODUODENOSCOPY)
Anesthesia: General

## 2018-07-02 MED ORDER — ENSURE ENLIVE PO LIQD: 237.0000 mL | Freq: Two times a day (BID) | ORAL | Status: DC

## 2018-07-02 MED ORDER — PANTOPRAZOLE SODIUM 40 MG PO TBEC: 40.0000 mg | DELAYED_RELEASE_TABLET | Freq: Two times a day (BID) | ORAL | 1 refills | Status: DC

## 2018-07-02 MED ORDER — PANTOPRAZOLE SODIUM 40 MG IV SOLR
40.0000 mg | Freq: Two times a day (BID) | INTRAVENOUS | Status: DC
  Administered 2018-07-02: 40 mg via INTRAVENOUS
  Filled 2018-07-02: qty 40

## 2018-07-02 MED ORDER — PROPOFOL 10 MG/ML IV BOLUS
INTRAVENOUS | Status: DC | PRN
  Administered 2018-07-02 (×5): 50 mg via INTRAVENOUS

## 2018-07-02 MED ORDER — VITAMIN C 500 MG PO TABS: 250.0000 mg | ORAL_TABLET | Freq: Two times a day (BID) | ORAL | Status: DC

## 2018-07-02 MED ORDER — GLYCOPYRROLATE 0.2 MG/ML IJ SOLN
INTRAMUSCULAR | Status: DC | PRN
  Administered 2018-07-02: 0.2 mg via INTRAVENOUS

## 2018-07-02 NOTE — Consult Note (Signed)
George Darby, MD [address]  [place]  [place], [place] [address]  Main: [phone]  Fax: [phone] Pager: [phone]   Consultation  Referring Provider:     No ref. provider found Primary Care Physician:  George Duster, MD Primary Gastroenterologist:  Dr. Vicente Gomez         Reason for Consultation:     Melena  Date of Admission:  07/01/2018 Date of Consultation:  07/02/2018         HPI:   George Gomez is a 58 y.o. male with known history of alcohol abuse, pancytopenia, metabolic syndrome presented to ER epigastric pain and black stools.  He continues to drink alcohol.  His hemoglobin on arrival was 9.3.  It was 11.3 in 02/2018.  He does not have thrombocytopenia.  AST greater than ALT.  BUN and creatinine both are elevated.  Patient is started on pantoprazole drip, kept n.p.o. and GI is consulted for further evaluation.  Lipase is normal. Pt is comfortable in bed, denies any abdominal pain, n/v  NSAIDs: None  Antiplts/Anticoagulants/Anti thrombotics: None  GI Procedures: EGD by Dr. Vicente Gomez in 05/2017 which revealed duodenitis only thought to be secondary to NSAID and alcohol use He had a colonoscopy at [facility] in 2016, 1 tubular adenoma removed  Past Medical History:  Diagnosis Date  . Anemia   . Diabetes mellitus without complication ([place])   . Diverticulitis    Pt states diverticulitis  . EtOH dependence ([place])   . Hypercholesteremia   . Hypertension     Past Surgical History:  Procedure Laterality Date  . ESOPHAGOGASTRODUODENOSCOPY (EGD) WITH PROPOFOL N/A 06/01/2017   Procedure: ESOPHAGOGASTRODUODENOSCOPY (EGD) WITH PROPOFOL;  Surgeon: George Bellows, MD;  Location: [facility] ENDOSCOPY;  Service: Gastroenterology;  Laterality: N/A;  . none      Prior to Admission medications   Medication Sig Start Date End Date Taking? Authorizing Provider  allopurinol (ZYLOPRIM) 300 MG tablet Take 300 mg by mouth daily.  05/28/18  Yes [provider]  amLODipine (NORVASC) 5  MG tablet Take 5 mg by mouth daily.  06/29/18  Yes [provider]  atorvastatin (LIPITOR) 40 MG tablet Take 40 mg by mouth daily at 6 PM.  06/02/18  Yes [provider]  losartan (COZAAR) 100 MG tablet Take 100 mg by mouth daily.  06/29/18  Yes [provider]  naltrexone (DEPADE) 50 MG tablet Take 50 mg by mouth daily.  06/15/18  Yes [provider]  propranolol ER (INDERAL LA) 60 MG 24 hr capsule Take 60 mg by mouth daily.  06/29/18  Yes [provider]  gabapentin (NEURONTIN) 300 MG capsule Take 300 mg by mouth 2 (two) times daily. 02/18/16   [provider]  lisinopril (PRINIVIL,ZESTRIL) 10 MG tablet Take 1 tablet by mouth daily. 05/19/17   [provider]  metFORMIN (GLUCOPHAGE-XR) 500 MG 24 hr tablet Take 2 tablets by mouth daily. 03/05/16   [provider]   Current Facility-Administered Medications:  .  0.9 %  sodium chloride infusion, , Intravenous, Continuous, Gomez, George Dills, NP, Last Rate: 75 mL/hr at 07/02/18 0409 .  acetaminophen (TYLENOL) tablet 650 mg, 650 mg, Oral, Q6H PRN **OR** acetaminophen (TYLENOL) suppository 650 mg, 650 mg, Rectal, Q6H PRN, Gomez, George H, NP .  allopurinol (ZYLOPRIM) tablet 300 mg, 300 mg, Oral, Daily, Gomez, George H, NP .  amLODipine (NORVASC) tablet 5 mg, 5 mg, Oral, Daily, Gomez, George H, NP .  atorvastatin (LIPITOR) tablet 40 mg,  40 mg, Oral, q1800, Gomez, George Dills, NP .  folic acid (FOLVITE) tablet 1 mg, 1 mg, Oral, Daily, Gomez, George H, NP .  insulin aspart (novoLOG) injection 0-15 Units, 0-15 Units, Subcutaneous, TID WC, Gomez, George H, NP .  insulin aspart (novoLOG) injection 0-5 Units, 0-5 Units, Subcutaneous, QHS, Gomez, George H, NP .  LORazepam (ATIVAN) tablet 1 mg, 1 mg, Oral, Q6H PRN **OR** LORazepam (ATIVAN) injection 1 mg, 1 mg, Intravenous, Q6H PRN, Gomez, George H, NP .  losartan (COZAAR) tablet 100 mg, 100 mg, Oral, Daily, Gomez, George H, NP .  multivitamin with minerals  tablet 1 tablet, 1 tablet, Oral, Daily, Gomez, George H, NP .  ondansetron (ZOFRAN) tablet 4 mg, 4 mg, Oral, Q6H PRN **OR** ondansetron (ZOFRAN) injection 4 mg, 4 mg, Intravenous, Q6H PRN, Gomez, George H, NP .  pantoprazole (PROTONIX) injection 40 mg, 40 mg, Intravenous, Q12H, Gomez, Pavan, MD .  promethazine (PHENERGAN) injection 12.5 mg, 12.5 mg, Intravenous, Q6H PRN, George Lot, MD, 12.5 mg at 07/01/18 2134 .  propranolol ER (INDERAL LA) 24 hr capsule 60 mg, 60 mg, Oral, Daily, Gomez, George H, NP .  sodium chloride flush (NS) 0.9 % injection 3 mL, 3 mL, Intravenous, Q12H, Gomez, George H, NP .  thiamine (VITAMIN B-1) tablet 100 mg, 100 mg, Oral, Daily **OR** thiamine (B-1) injection 100 mg, 100 mg, Intravenous, Daily, Gomez, George H, NP .  vitamin C (ASCORBIC ACID) tablet 250 mg, 250 mg, Oral, BID, Gomez, George Heman, MD   Family History  Problem Relation Age of Onset  . Rheum arthritis Mother   . Diabetes Father   . Heart disease Father   . Cancer Father   . Cancer Maternal Grandfather   . Cancer Paternal Grandfather      Social History   Tobacco Use  . Smoking status: Never Smoker  . Smokeless tobacco: Never Used  Substance Use Topics  . Alcohol use: Yes    Alcohol/week: 6.0 standard drinks    Types: 6 Shots of liquor per week    Comment: 0.5 pint a day of liquor  . Drug use: No    Allergies as of 07/01/2018  . (No Known Allergies)    Review of Systems:    All systems reviewed and negative except where noted in HPI.   Physical Exam:  Vital signs in last 24 hours: Temp:  [97.5 F (36.4 C)-98.7 F (37.1 C)] 98.1 F (36.7 C) (06/05 0856) Pulse Rate:  [65-106] 86 (06/05 0856) Resp:  [17-20] 20 (06/05 0856) BP: (111-128)/(60-81) 128/80 (06/05 0856) SpO2:  [89 %-98 %] 94 % (06/05 0856) Weight:  [83.9 kg-92.8 kg] 92.8 kg (06/04 2342)   General:   Pleasant, cooperative in NAD Head:  Normocephalic and atraumatic. Eyes:   No icterus.   Conjunctiva muddy.  PERRLA. Ears:  Normal auditory acuity. Neck:  Supple; no masses or thyroidomegaly Lungs: Respirations even and unlabored. Lungs clear to auscultation bilaterally.   No wheezes, crackles, or rhonchi.  Heart:  Regular rate and rhythm;  Without murmur, clicks, rubs or gallops Abdomen:  Soft, obese, nondistended, nontender. Normal bowel sounds. No appreciable masses or hepatomegaly.  No rebound or guarding.  Rectal:  Not performed. Msk:  Symmetrical without gross deformities.  Strength generalized weakness Extremities:  Without edema, cyanosis or clubbing. Neurologic:  Alert and oriented x3;  grossly normal neurologically. Skin:  Intact without significant lesions or rashes. Psych:  Alert and cooperative. Normal affect.  LAB RESULTS: CBC Latest Ref Rng &  Units 07/02/2018 07/02/2018 07/01/2018  WBC 4.0 - 10.5 K/uL - 2.8(L) -  Hemoglobin 13.0 - 17.0 g/dL 8.6(L) 8.4(L) 9.2(L)  Hematocrit 39.0 - 52.0 % 26.4(L) 26.1(L) 28.3(L)  Platelets 150 - 400 K/uL - 154 -    BMET BMP Latest Ref Rng & Units 07/02/2018 07/01/2018 12/03/2017  Glucose 70 - 99 mg/dL 92 146(H) 110(H)  BUN 6 - 20 mg/dL 26(H) 27(H) 19  Creatinine 0.61 - 1.24 mg/dL 1.25(H) 1.78(H) 1.05  Sodium 135 - 145 mmol/L 143 142 140  Potassium 3.5 - 5.1 mmol/L 4.2 4.2 3.9  Chloride 98 - 111 mmol/L 108 105 106  CO2 22 - 32 mmol/L 21(L) 18(L) 25  Calcium 8.9 - 10.3 mg/dL 8.4(L) 9.0 9.4    LFT Hepatic Function Latest Ref Rng & Units 07/01/2018 07/01/2018 12/03/2017  Total Protein 6.5 - 8.1 g/dL 7.6 8.0 7.7  Albumin 3.5 - 5.0 g/dL 4.1 4.2 4.1  AST 15 - 41 U/L 66(H) 80(H) 28  ALT 0 - 44 U/L 35 38 26  Alk Phosphatase 38 - 126 U/L 101 102 86  Total Bilirubin 0.3 - 1.2 mg/dL 0.3 0.5 0.5  Bilirubin, Direct 0.0 - 0.2 mg/dL <0.1 <0.1 -     STUDIES: Dg Chest 2 View  Result Date: 07/01/2018 CLINICAL DATA:  Chest pain EXAM: CHEST - 2 VIEW COMPARISON:  05/30/2017 FINDINGS: The lung volumes are low. There is some mild volume overload. The heart size is  stable from prior study but is borderline enlarged. There is no pneumothorax. There is some mild elevation of the right hemidiaphragm. IMPRESSION: Low lung volumes, otherwise no acute abnormality. Electronically Signed   By: Constance Holster M.D.   On: 07/01/2018 20:55      Impression / Plan:   Murrell Dome is a 58 y.o. male with metabolic syndrome, alcohol abuse presents with alcohol intoxication, epigastric pain, melena resulting in anemia.  Differentials include peptic ulcer disease or alcoholic gastritis or esophagitis.  He does not have cirrhosis yet  Maintain n.p.o. Continue pantoprazole drip We will perform EGD today Complete abstinence from alcohol Per CIWA protocol  Thank you for involving me in the care of this patient.  Will follow along with you    LOS: 1 day   George Sear, MD  07/02/2018, 10:00 AM   Note: This dictation was prepared with Dragon dictation along with smaller phrase technology. Any transcriptional errors that result from this process are unintentional.

## 2018-07-02 NOTE — Anesthesia Postprocedure Evaluation (Signed)
Anesthesia Post Note  Patient: Karlon Rumbley  Procedure(s) Performed: ESOPHAGOGASTRODUODENOSCOPY (EGD) (N/A )  Patient location during evaluation: Endoscopy Anesthesia Type: General Level of consciousness: awake and alert and oriented Pain management: pain level controlled Vital Signs Assessment: post-procedure vital signs reviewed and stable Respiratory status: spontaneous breathing, nonlabored ventilation and respiratory function stable Cardiovascular status: blood pressure returned to baseline and stable Postop Assessment: no signs of nausea or vomiting Anesthetic complications: no     Last Vitals:  Vitals:   07/02/18 0856 07/02/18 1350  BP: 128/80 103/61  Pulse: 86   Resp: 20   Temp: 36.7 C (!) 36.3 C  SpO2: 94%     Last Pain:  Vitals:   07/02/18 1410  TempSrc:   PainSc: 0-No pain                 Jamon Hayhurst

## 2018-07-02 NOTE — Plan of Care (Signed)
  Problem: Education: Goal: Knowledge of General Education information will improve Description: Including pain rating scale, medication(s)/side effects and non-pharmacologic comfort measures Outcome: Progressing   Problem: Health Behavior/Discharge Planning: Goal: Ability to manage health-related needs will improve Outcome: Progressing   Problem: Clinical Measurements: Goal: Ability to maintain clinical measurements within normal limits will improve Outcome: Progressing Goal: Will remain free from infection Outcome: Progressing Goal: Diagnostic test results will improve Outcome: Progressing Goal: Respiratory complications will improve Outcome: Progressing Goal: Cardiovascular complication will be avoided Outcome: Progressing   Problem: Activity: Goal: Risk for activity intolerance will decrease Outcome: Progressing   Problem: Nutrition: Goal: Adequate nutrition will be maintained Outcome: Progressing   Problem: Coping: Goal: Level of anxiety will decrease Outcome: Progressing   Problem: Elimination: Goal: Will not experience complications related to bowel motility Outcome: Progressing Goal: Will not experience complications related to urinary retention Outcome: Progressing   Problem: Pain Managment: Goal: General experience of comfort will improve Outcome: Progressing   Problem: Safety: Goal: Ability to remain free from injury will improve Outcome: Progressing   Problem: Skin Integrity: Goal: Risk for impaired skin integrity will decrease Outcome: Progressing   Problem: Education: Goal: Ability to identify signs and symptoms of gastrointestinal bleeding will improve Outcome: Progressing   Problem: Bowel/Gastric: Goal: Will show no signs and symptoms of gastrointestinal bleeding Outcome: Progressing   Problem: Fluid Volume: Goal: Will show no signs and symptoms of excessive bleeding Outcome: Progressing   Problem: Clinical Measurements: Goal:  Complications related to the disease process, condition or treatment will be avoided or minimized Outcome: Progressing   Problem: Education: Goal: Knowledge of disease or condition will improve Outcome: Progressing Goal: Understanding of discharge needs will improve Outcome: Progressing   Problem: Health Behavior/Discharge Planning: Goal: Ability to identify changes in lifestyle to reduce recurrence of condition will improve Outcome: Progressing Goal: Identification of resources available to assist in meeting health care needs will improve Outcome: Progressing   Problem: Physical Regulation: Goal: Complications related to the disease process, condition or treatment will be avoided or minimized Outcome: Progressing   Problem: Safety: Goal: Ability to remain free from injury will improve Outcome: Progressing   

## 2018-07-02 NOTE — Anesthesia Preprocedure Evaluation (Signed)
Anesthesia Evaluation  Patient identified by MRN, date of birth, ID band Patient awake    Reviewed: Allergy & Precautions, NPO status , Patient's Chart, lab work & pertinent test results  History of Anesthesia Complications Negative for: history of anesthetic complications  Airway Mallampati: II  TM Distance: >3 FB Neck ROM: Full    Dental  (+) Poor Dentition   Pulmonary sleep apnea (does not wear CPAP) , neg COPD,    breath sounds clear to auscultation- rhonchi (-) wheezing      Cardiovascular hypertension, Pt. on medications (-) CAD, (-) Past MI, (-) Cardiac Stents and (-) CABG  Rhythm:Regular Rate:Normal - Systolic murmurs and - Diastolic murmurs    Neuro/Psych neg Seizures negative neurological ROS  negative psych ROS   GI/Hepatic negative GI ROS, Neg liver ROS,   Endo/Other  diabetes, Oral Hypoglycemic Agents  Renal/GU negative Renal ROS     Musculoskeletal negative musculoskeletal ROS (+)   Abdominal (+) - obese,   Peds  Hematology negative hematology ROS (+) anemia ,   Anesthesia Other Findings Past Medical History: No date: Anemia No date: Diabetes mellitus without complication ([facility]) No date: Diverticulitis     Comment:  Pt states diverticulitis No date: EtOH dependence ([facility]) No date: Hypercholesteremia No date: Hypertension   Reproductive/Obstetrics                             Anesthesia Physical Anesthesia Plan  ASA: III  Anesthesia Plan: General   Post-op Pain Management:    Induction: Intravenous  PONV Risk Score and Plan: 1 and Propofol infusion  Airway Management Planned: Natural Airway  Additional Equipment:   Intra-op Plan:   Post-operative Plan:   Informed Consent: I have reviewed the patients History and Physical, chart, labs and discussed the procedure including the risks, benefits and alternatives for the proposed anesthesia with the patient or  authorized representative who has indicated his/her understanding and acceptance.     Dental advisory given  Plan Discussed with: CRNA and Anesthesiologist  Anesthesia Plan Comments:         Anesthesia Quick Evaluation

## 2018-07-02 NOTE — Progress Notes (Signed)
Pt for discharge home. A/o. No resp distress or any  N/v.  Denies pain. Instructions discussed with pt.  meds diet / activity and f/u discussed.  Sl d/cd/the patient calling friend to come pick  Him up.

## 2018-07-02 NOTE — Progress Notes (Signed)
Initial Nutrition Assessment  DOCUMENTATION CODES:   Not applicable  INTERVENTION:   RD will add supplements if needed once diet advanced  MVI, thiamine and folic acid in setting of etoh abuse  Vitamin C 250mg  po BID  Suspect pt is at high refeed risk; recommend monitor K, Mg and P labs daily once diet advanced.   NUTRITION DIAGNOSIS:   Inadequate oral intake related to acute illness as evidenced by NPO status.  GOAL:   Patient will meet greater than or equal to 90% of their needs  MONITOR:   Diet advancement, Labs, Weight trends, I & O's, Skin  REASON FOR ASSESSMENT:   Malnutrition Screening Tool    ASSESSMENT:   58 y.o. male below listed past medical history with extensive history of alcohol dependence with previous admission to hospital for duodenitis presents the ER for burning epigastric discomfort intermittent dark black-colored stools with nausea but no vomiting.  RD working remotely.  Suspect pt with poor appetite and oral intake at baseline r/t etoh abuse. Per chart, pt with 20lb(9%) weight loss over the past 4 months which is significant. Pt is currently NPO pending GI consult. RD will add supplements once diet advanced. Will add vitamin C in setting of etoh abuse and GIB. Suspect pt is at high refeed risk; recommend monitor K, Mg and P labs daily once diet advanced.   Medications reviewed and include: allopurinol, folic acid, insulin, MVI, protonix, thiamine, NaCl @75ml /hr  Labs reviewed: BUN 26(H), creat 1.25(H) Wbc 2.8(L), Hgb 8.6(L), Hct 26.4(L)  Unable to complete Nutrition-Focused physical exam at this time.   Diet Order:   Diet Order            Diet NPO time specified  Diet effective midnight             EDUCATION NEEDS:   No education needs have been identified at this time  Skin:  Skin Assessment: Reviewed RN Assessment  Last BM:  pta  Height:   Ht Readings from Last 1 Encounters:  07/01/18 5\' 11"  (1.803 m)    Weight:   Wt  Readings from Last 1 Encounters:  07/01/18 92.8 kg    Ideal Body Weight:  78 kg  BMI:  Body mass index is 28.54 kg/m.  Estimated Nutritional Needs:   Kcal:  1900-2200kcal/day   Protein:  95-110g/day   Fluid:  >2.3L/day   Betsey Holiday MS, RD, LDN Pager #- [phone] Office#- [phone] After Hours Pager: [phone]

## 2018-07-02 NOTE — Anesthesia Post-op Follow-up Note (Signed)
Anesthesia QCDR form completed.        

## 2018-07-02 NOTE — Transfer of Care (Signed)
Immediate Anesthesia Transfer of Care Note  Patient: George Gomez  Procedure(s) Performed: ESOPHAGOGASTRODUODENOSCOPY (EGD) (N/A )  Patient Location: Endoscopy Unit  Anesthesia Type:General  Level of Consciousness: awake, drowsy and patient cooperative  Airway & Oxygen Therapy: Patient Spontanous Breathing and Patient connected to face mask oxygen  Post-op Assessment: Report given to RN and Post -op Vital signs reviewed and stable  Post vital signs: Reviewed and stable  Last Vitals:  Vitals Value Taken Time  BP    Temp    Pulse 88 07/02/2018  1:50 PM  Resp 18 07/02/2018  1:50 PM  SpO2 96 % 07/02/2018  1:50 PM  Vitals shown include unvalidated device data.  Last Pain:  Vitals:   07/02/18 1350  TempSrc: (P) Tympanic  PainSc:          Complications: No apparent anesthesia complications

## 2018-07-02 NOTE — Op Note (Signed)
[facility] Gastroenterology Patient Name: George Gomez Procedure Date: 07/02/2018 1:29 PM MRN: [id number] Account #: [id number] Date of Birth: 1960/09/02 Admit Type: Inpatient Age: 58 Room: [facility] ENDO ROOM 3 Gender: Male Note Status: Finalized Procedure:            Upper GI endoscopy Indications:          Melena Providers:            Lin Landsman MD, MD Referring MD:         No Local Md, MD (Referring MD) Medicines:            Monitored Anesthesia Care Complications:        No immediate complications. Estimated blood loss: None. Procedure:            Pre-Anesthesia Assessment:                       - Prior to the procedure, a History and Physical was                        performed, and patient medications and allergies were                        reviewed. The patient is competent. The risks and                        benefits of the procedure and the sedation options and                        risks were discussed with the patient. All questions                        were answered and informed consent was obtained.                        Patient identification and proposed procedure were                        verified by the physician, the nurse, the                        anesthesiologist, the anesthetist and the technician in                        the pre-procedure area in the procedure room in the                        endoscopy suite. Mental Status Examination: alert and                        oriented. Airway Examination: normal oropharyngeal                        airway and neck mobility. Respiratory Examination:                        clear to auscultation. CV Examination: normal.                        Prophylactic Antibiotics: The patient does not require  prophylactic antibiotics. Prior Anticoagulants: The                        patient has taken no previous anticoagulant or                        antiplatelet agents.  ASA Grade Assessment: III - A                        patient with severe systemic disease. After reviewing                        the risks and benefits, the patient was deemed in                        satisfactory condition to undergo the procedure. The                        anesthesia plan was to use monitored anesthesia care                        (MAC). Immediately prior to administration of                        medications, the patient was re-assessed for adequacy                        to receive sedatives. The heart rate, respiratory rate,                        oxygen saturations, blood pressure, adequacy of                        pulmonary ventilation, and response to care were                        monitored throughout the procedure. The physical status                        of the patient was re-assessed after the procedure.                       After obtaining informed consent, the endoscope was                        passed under direct vision. Throughout the procedure,                        the patient's blood pressure, pulse, and oxygen                        saturations were monitored continuously. The Endoscope                        was introduced through the mouth, and advanced to the                        second part of duodenum. The upper GI endoscopy was  accomplished without difficulty. The patient tolerated                        the procedure fairly well. Findings:      The duodenal bulb and second portion of the duodenum were normal.      Mild erythema/possible portal hypertensive gastropathy was found in the       gastric fundus and in the gastric body. Biopsies were taken with a cold       forceps for histology from antrum, body. Biopsies were taken with a cold       forceps for histology.      The gastroesophageal junction and examined esophagus were normal. Impression:           - Normal duodenal bulb and second portion of the                         duodenum.                       - Portal hypertensive gastropathy. Biopsied.                       - Normal gastroesophageal junction and esophagus. Recommendation:       - Await pathology results.                       - Return patient to hospital ward for ongoing care.                       - Diabetic (ADA) diet today.                       - Continue present medications.                       - Use a proton pump inhibitor PO BID.                       - Abstinance from ETOH                       - Follow up with Dr Vicente Males as outpt Procedure Code(s):    --- Professional ---                       [phone], Esophagogastroduodenoscopy, flexible, transoral;                        with biopsy, single or multiple Diagnosis Code(s):    --- Professional ---                       K76.6, Portal hypertension                       K31.89, Other diseases of stomach and duodenum                       K92.1, Melena (includes Hematochezia) CPT copyright 2019 [employer]. All rights reserved. The codes documented in this report are preliminary and upon coder review may  be revised to meet current compliance requirements. Dr. Ulyess Mort Lin Landsman MD, MD 07/02/2018 1:54:46 PM This report has been signed  electronically. Number of Addenda: 0 Note Initiated On: 07/02/2018 1:29 PM Estimated Blood Loss: Estimated blood loss: none.      [facility]

## 2018-07-02 NOTE — Discharge Summary (Signed)
[facility] [place] [facility]   PATIENT NAME: George Gomez    MR#:  [id number]  DATE OF BIRTH:  04-Aug-1960  DATE OF ADMISSION:  07/01/2018 ADMITTING PHYSICIAN: Ihor Austin, MD  DATE OF DISCHARGE: 07/02/2018  PRIMARY CARE PHYSICIAN: Blair Heys, MD    ADMISSION DIAGNOSIS:  Melena [K92.1] Epigastric pain [R10.13] Alcohol dependence with intoxication with complication (HCC) [F10.229]  DISCHARGE DIAGNOSIS:  Active Problems:   GI bleed   SECONDARY DIAGNOSIS:   Past Medical History:  Diagnosis Date  . Anemia   . Diabetes mellitus without complication (HCC)   . Diverticulitis    Pt states diverticulitis  . EtOH dependence (HCC)   . Hypercholesteremia   . Hypertension     HOSPITAL COURSE:   58 year old male with past medical history of alcohol abuse, diabetes, hypertension, hyperlipidemia, chronic anemia who presented to the hospital due to anemia with heme positive stools.  1.  GI bleed-patient presented to the hospital with anemia and heme positive stools. - Given patient's history of alcohol abuse this was suspected to be a upper GI bleed. Patient's hemoglobin remained stable and he did not require any transfusion.  Seen by gastroenterology and underwent upper GI endoscopy which showed alcoholic gastritis but no evidence of acute bleeding. - Patient is being discharged on PPI twice daily with outpatient follow-up with gastroenterology.  2.  Anemia-this is anemia of chronic disease secondary to underlying alcohol abuse. - Patient was also noted to be heme positive but hemoglobin did not drop and he did not require transfusions. - Continue follow hemoglobin as an outpatient.  Patient strongly advised to abstain from drinking.  3.  Acute kidney injury-secondary to dehydration/GI bleed. -After IV fluid duration patient's creatinine has improved and is close to baseline now.  4.  Essential hypertension-patient will continue his Norvasc, lisinopril,  Cozaar.  5.  Diabetes type 2 without complication- patient was placed on sliding scale insulin while in the hospital but will resume his metformin upon discharge.  6.  Hyperlipidemia-patient will continue his atorvastatin.  DISCHARGE CONDITIONS:   Stable  CONSULTS OBTAINED:  Treatment Team:  Toney Reil, MD  DRUG ALLERGIES:  No Known Allergies  DISCHARGE MEDICATIONS:   Allergies as of 07/02/2018   No Known Allergies     Medication List    TAKE these medications   allopurinol 300 MG tablet Commonly known as:  ZYLOPRIM Take 300 mg by mouth daily.   amLODipine 5 MG tablet Commonly known as:  NORVASC Take 5 mg by mouth daily.   atorvastatin 40 MG tablet Commonly known as:  LIPITOR Take 40 mg by mouth daily at 6 PM.   gabapentin 300 MG capsule Commonly known as:  NEURONTIN Take 300 mg by mouth 2 (two) times daily.   lisinopril 10 MG tablet Commonly known as:  ZESTRIL Take 1 tablet by mouth daily.   losartan 100 MG tablet Commonly known as:  COZAAR Take 100 mg by mouth daily.   metFORMIN 500 MG 24 hr tablet Commonly known as:  GLUCOPHAGE-XR Take 2 tablets by mouth daily.   naltrexone 50 MG tablet Commonly known as:  DEPADE Take 50 mg by mouth daily.   pantoprazole 40 MG tablet Commonly known as:  PROTONIX Take 1 tablet (40 mg total) by mouth 2 (two) times daily before a meal.   propranolol ER 60 MG 24 hr capsule Commonly known as:  INDERAL LA Take 60 mg by mouth daily.  DISCHARGE INSTRUCTIONS:   DIET:  Cardiac diet and Diabetic diet  DISCHARGE CONDITION:  Stable  ACTIVITY:  Activity as tolerated  OXYGEN:  Home Oxygen: No.   Oxygen Delivery: room air  DISCHARGE LOCATION:  home   If you experience worsening of your admission symptoms, develop shortness of breath, life threatening emergency, suicidal or homicidal thoughts you must seek medical attention immediately by calling 911 or calling your MD immediately  if symptoms  less severe.  You Must read complete instructions/literature along with all the possible adverse reactions/side effects for all the Medicines you take and that have been prescribed to you. Take any new Medicines after you have completely understood and accpet all the possible adverse reactions/side effects.   Please note  You were cared for by a hospitalist during your hospital stay. If you have any questions about your discharge medications or the care you received while you were in the hospital after you are discharged, you can call the unit and asked to speak with the hospitalist on call if the hospitalist that took care of you is not available. Once you are discharged, your primary care physician will handle any further medical issues. Please note that NO REFILLS for any discharge medications will be authorized once you are discharged, as it is imperative that you return to your primary care physician (or establish a relationship with a primary care physician if you do not have one) for your aftercare needs so that they can reassess your need for medications and monitor your lab values.     Today   No acute events overnight. Hg. Stable. Hemodynamically stable.  Status post endoscopy today showing alcoholic gastritis but no evidence of acute bleeding.  Will discharge home today.  VITAL SIGNS:  Blood pressure 103/61, pulse 86, temperature (!) 97.3 F (36.3 C), temperature source Tympanic, resp. rate 20, height  (1.803 m), weight 92.8 kg, SpO2 94 %.  I/O:    Intake/Output Summary (Last 24 hours) at 07/02/2018 1515 Last data filed at 07/02/2018 1446 Gross per 24 hour  Intake 526.89 ml  Output 850 ml  Net -323.11 ml    PHYSICAL EXAMINATION:  GENERAL:  58 y.o.-year-old patient lying in the bed with no acute distress.  EYES: Pupils equal, round, reactive to light and accommodation. No scleral icterus. Extraocular muscles intact.  HEENT: Head atraumatic, normocephalic. Oropharynx and  nasopharynx clear.  NECK:  Supple, no jugular venous distention. No thyroid enlargement, no tenderness.  LUNGS: Normal breath sounds bilaterally, no wheezing, rales,rhonchi. No use of accessory muscles of respiration.  CARDIOVASCULAR: S1, S2 normal. No murmurs, rubs, or gallops.  ABDOMEN: Soft, non-tender, non-distended. Bowel sounds present. No organomegaly or mass.  EXTREMITIES: No pedal edema, cyanosis, or clubbing.  NEUROLOGIC: Cranial nerves II through XII are intact. No focal motor or sensory defecits b/l.  PSYCHIATRIC: The patient is alert and oriented x 3.  SKIN: No obvious rash, lesion, or ulcer.   DATA REVIEW:   CBC Recent Labs  Lab 07/02/18 0508 07/02/18 0759  WBC 2.8*  --   HGB 8.4* 8.6*  HCT 26.1* 26.4*  PLT 154  --     Chemistries  Recent Labs  Lab 07/01/18 2358 07/02/18 0508  NA  --  143  K  --  4.2  CL  --  108  CO2  --  21*  GLUCOSE  --  92  BUN  --  26*  CREATININE  --  1.25*  CALCIUM  --  8.4*  AST 66*  --   ALT 35  --   ALKPHOS 101  --   BILITOT 0.3  --     Cardiac Enzymes Recent Labs  Lab 07/01/18 1845  TROPONINI <0.03    Microbiology Results  Results for orders placed or performed during the hospital encounter of 07/01/18  SARS Coronavirus 2 (CEPHEID - Performed in [facility] hospital lab), Hosp Order     Status: None   Collection Time: 07/01/18  9:25 PM  Result Value Ref Range Status   SARS Coronavirus 2 NEGATIVE NEGATIVE Final    Comment: (NOTE) If result is NEGATIVE SARS-CoV-2 target nucleic acids are NOT DETECTED. The SARS-CoV-2 RNA is generally detectable in upper and lower  respiratory specimens during the acute phase of infection. The lowest  concentration of SARS-CoV-2 viral copies this assay can detect is 250  copies / mL. A negative result does not preclude SARS-CoV-2 infection  and should not be used as the sole basis for treatment or other  patient management decisions.  A negative result may occur with  improper  specimen collection / handling, submission of specimen other  than nasopharyngeal swab, presence of viral mutation(s) within the  areas targeted by this assay, and inadequate number of viral copies  (<250 copies / mL). A negative result must be combined with clinical  observations, patient history, and epidemiological information. If result is POSITIVE SARS-CoV-2 target nucleic acids are DETECTED. The SARS-CoV-2 RNA is generally detectable in upper and lower  respiratory specimens dur ing the acute phase of infection.  Positive  results are indicative of active infection with SARS-CoV-2.  Clinical  correlation with patient history and other diagnostic information is  necessary to determine patient infection status.  Positive results do  not rule out bacterial infection or co-infection with other viruses. If result is PRESUMPTIVE POSTIVE SARS-CoV-2 nucleic acids MAY BE PRESENT.   A presumptive positive result was obtained on the submitted specimen  and confirmed on repeat testing.  While 2019 novel coronavirus  (SARS-CoV-2) nucleic acids may be present in the submitted sample  additional confirmatory testing may be necessary for epidemiological  and / or clinical management purposes  to differentiate between  SARS-CoV-2 and other Sarbecovirus currently known to infect humans.  If clinically indicated additional testing with an alternate test  methodology [phone]) is advised. The SARS-CoV-2 RNA is generally  detectable in upper and lower respiratory sp ecimens during the acute  phase of infection. The expected result is Negative. Fact Sheet for Patients:  BoilerBrush.com.cy Fact Sheet for Healthcare Providers: https://pope.com/ This test is not yet approved or cleared by the [place] FDA and has been authorized for detection and/or diagnosis of SARS-CoV-2 by FDA under an Emergency Use Authorization (EUA).  This EUA will remain in  effect (meaning this test can be used) for the duration of the COVID-19 declaration under Section 564(b)(1) of the Act, 21 U.S.C. section 360bbb-3(b)(1), unless the authorization is terminated or revoked sooner. Performed at [facility], [address] [place], [place] [address]     RADIOLOGY:  Dg Chest 2 View  Result Date: 07/01/2018 CLINICAL DATA:  Chest pain EXAM: CHEST - 2 VIEW COMPARISON:  05/30/2017 FINDINGS: The lung volumes are low. There is some mild volume overload. The heart size is stable from prior study but is borderline enlarged. There is no pneumothorax. There is some mild elevation of the right hemidiaphragm. IMPRESSION: Low lung volumes, otherwise no acute abnormality. Electronically Signed   By: Cristal Deer  Green M.D.   On: 07/01/2018 20:55      Management plans discussed with the patient, family and they are in agreement.  CODE STATUS:     Code Status Orders  (From admission, onward)         Start     Ordered   07/01/18 2225  Full code  Continuous     07/01/18 2230          TOTAL TIME TAKING CARE OF THIS PATIENT: 40 minutes.    Houston SirenVivek J Sainani M.D on 07/02/2018 at 3:15 PM  Between 7am to 6pm - Pager - [phone]  After 6pm go to [email or web address] - Social research officer, [email or web address] [email or web address]  [facility]  Office  [phone]  CC: Primary care physician; Blair Heysampbell, [place], MD

## 2018-07-03 LAB — HEMOGLOBIN A1C
Hgb A1c MFr Bld: 6 % — ABNORMAL HIGH (ref 4.8–5.6)
Mean Plasma Glucose: 126 mg/dL

## 2018-07-04 ENCOUNTER — Other Ambulatory Visit: Payer: Self-pay | Admitting: Hematology and Oncology

## 2018-07-04 DIAGNOSIS — K921 Melena: Secondary | ICD-10-CM

## 2018-07-04 DIAGNOSIS — E538 Deficiency of other specified B group vitamins: Secondary | ICD-10-CM

## 2018-07-04 DIAGNOSIS — D649 Anemia, unspecified: Secondary | ICD-10-CM

## 2018-07-04 NOTE — Progress Notes (Signed)
[facility]  [address], [address] [place], [place] [address] Phone: [phone]  Fax: [phone]   Telemedicine Office Visit:  07/05/2018  Referring physician: Blair Heysampbell, Thane, MD  I connected with George Gomez on 07/05/2018 at 11:51 AM by videoconferencing and verified that I was speaking with the correct person using 2 identifiers.  The patient was at his parents house.  I discussed the limitations, risk, security and privacy concerns of performing an evaluation and management service by videoconferencing and the availability of in person appointments.  I also discussed with the patient that there may be a patient responsible charge related to this service.  The patient expressed understanding and agreed to proceed.   Chief Complaint: George Gomez is a 58 y.o. male with anemia and B12 deficiency who is seen for 3 month assessment.  HPI: The patient was last seen in the hematology clinic on 03/19/2018. At that time, he noted more energy.  Exam was unremarkable.  Hemoglobin was 11.3 with an MCV 93.4. Platelets were 168,000.  White count 4400 (ANC 2600).  He received B12 on 04/13/2018.   [facility] was [facility] from 07/[facility] - 07/02/2018 with a GI bleed.  He presented with burning epigastric discomfort, intermittent dark diarrhea, and nausea while intoxicated. EGD on 07/02/2018 by Dr. Allegra LaiVanga showed alcoholic gastritis but no evidence of acute bleeding.  Stools were heme positive.  He did not require a PRBC transfusion. Hemoglobin ranged between 8.4 - 8.7. He was discharged with PPI twice daily with plans for follow-up with gastroenterology.   During the interim, the patient reports "I feel alright now." He reports fatigue following his hospitalization. His weight was 207 lbs this morning. He has tingling and numbness in his feet related to diabetes.   He reports he had a B12 injection at [facility] 1-2 months ago. He reports he is on pills that will slowly  take away his urge to drink, given to him by Dr. Allegra LaiVanga.    Past Medical History:  Diagnosis Date  . Anemia   . Diabetes mellitus without complication (HCC)   . Diverticulitis    Pt states diverticulitis  . EtOH dependence (HCC)   . Hypercholesteremia   . Hypertension     Past Surgical History:  Procedure Laterality Date  . ESOPHAGOGASTRODUODENOSCOPY N/A 07/02/2018   Procedure: ESOPHAGOGASTRODUODENOSCOPY (EGD);  Surgeon: Toney ReilVanga, Rohini Reddy, MD;  [facility];  Service: Gastroenterology;  Laterality: N/A;  . ESOPHAGOGASTRODUODENOSCOPY (EGD) WITH PROPOFOL N/A 06/01/2017   Procedure: ESOPHAGOGASTRODUODENOSCOPY (EGD) WITH PROPOFOL;  Surgeon: Wyline MoodAnna, Kiran, MD;  [facility];  Service: Gastroenterology;  Laterality: N/A;  . none      Family History  Problem Relation Age of Onset  . Rheum arthritis Mother   . Diabetes Father   . Heart disease Father   . Cancer Father   . Cancer Maternal Grandfather   . Cancer Paternal Grandfather     Social History:  reports that he has never smoked. He has never used smokeless tobacco. He reports current alcohol use of about 6.0 standard drinks of alcohol per week. He reports that he does not use drugs. He drinks liquor 2-3 x/day.  He is drinking less than he did in the past (moonshine), about 2 shots daily.  He did "crack" 10-15 years ago. [place] in [place].  The patient is accompanied by his niece, George Gomez, today.  Participants in the patient's visit and their role in the encounter included the patient, his niece George Gomez, and  Bronwen Betters, CMA, today.  The intake visit was provided by Bronwen Betters, CMA.  Allergies: No Known Allergies  Current Medications: Current Outpatient Medications  Medication Sig Dispense Refill  . allopurinol (ZYLOPRIM) 300 MG tablet Take 300 mg by mouth daily.     Marland Kitchen amLODipine (NORVASC) 5 MG tablet Take 5 mg by mouth daily.     Marland Kitchen atorvastatin (LIPITOR) 40 MG tablet Take 40 mg by mouth  daily at 6 PM.     . gabapentin (NEURONTIN) 300 MG capsule Take 300 mg by mouth 2 (two) times daily.  3  . lisinopril (PRINIVIL,ZESTRIL) 10 MG tablet Take 1 tablet by mouth daily.  11  . losartan (COZAAR) 100 MG tablet Take 100 mg by mouth daily.     . metFORMIN (GLUCOPHAGE-XR) 500 MG 24 hr tablet Take 2 tablets by mouth daily.    . naltrexone (DEPADE) 50 MG tablet Take 50 mg by mouth daily.     . pantoprazole (PROTONIX) 40 MG tablet Take 1 tablet (40 mg total) by mouth 2 (two) times daily before a meal. 60 tablet 1  . propranolol ER (INDERAL LA) 60 MG 24 hr capsule Take 60 mg by mouth daily.      No current facility-administered medications for this visit.     Review of Systems  Constitutional: Positive for malaise/fatigue and weight loss (17 lbs; 207lbs today). Negative for chills and fever.       Energy level improving.  HENT: Negative.  Negative for congestion, ear pain, hearing loss, nosebleeds, sinus pain and sore throat.   Eyes: Negative.  Negative for blurred vision and double vision.  Respiratory: Negative.  Negative for cough and shortness of breath.   Cardiovascular: Negative.  Negative for chest pain, palpitations and orthopnea.  Gastrointestinal: Negative for abdominal pain, blood in stool, constipation, diarrhea, heartburn, melena, nausea and vomiting.       Interval GI bleed.  Genitourinary: Negative for dysuria, frequency and urgency.       H/o kidney stones.  Musculoskeletal: Negative for back pain, joint pain and myalgias.  Skin: Negative for itching and rash.  Neurological: Positive for tingling (feet, diabetic neuropathy) and sensory change (feet, diabetic neuropathy). Negative for dizziness, weakness and headaches.  Endo/Heme/Allergies: Does not bruise/bleed easily.       Diabetes  Psychiatric/Behavioral: Negative for depression and memory loss. The patient is not nervous/anxious and does not have insomnia.   All other systems reviewed and are negative.    Performance status (ECOG): 1  Physical Exam  Constitutional: He is oriented to person, place, and time. He appears well-developed and well-nourished. No distress.  HENT:  Head: Normocephalic and atraumatic.  Wearing a cap.  Berneice Gandy.  Eyes: Conjunctivae and EOM are normal. No scleral icterus.  Brown eyes.  Neurological: He is alert and oriented to person, place, and time.  Psychiatric: He has a normal mood and affect. His behavior is normal. Judgment and thought content normal.  Nursing note reviewed.   No visits with results within 3 Day(s) from this visit.  Latest known visit with results is:  Admission on 07/01/2018, Discharged on 07/02/2018  Component Date Value Ref Range Status  . Sodium 07/01/2018 142  135 - 145 mmol/L Final  . Potassium 07/01/2018 4.2  3.5 - 5.1 mmol/L Final  . Chloride 07/01/2018 105  98 - 111 mmol/L Final  . CO2 07/01/2018 18* 22 - 32 mmol/L Final  . Glucose, Bld 07/01/2018 146* 70 - 99 mg/dL Final  . BUN  07/01/2018 27* 6 - 20 mg/dL Final  . Creatinine, Ser 07/01/2018 1.78* 0.61 - 1.24 mg/dL Final  . Calcium 16/10/9602 9.0  8.9 - 10.3 mg/dL Final  . GFR calc non Af Amer 07/01/2018 41* >60 mL/min Final  . GFR calc Af Amer 07/01/2018 47* >60 mL/min Final  . Anion gap 07/01/2018 19* 5 - 15 Final   Performed at [facility], [address], [place], [place] [address]  . WBC 07/01/2018 4.5  4.0 - 10.5 K/uL Final  . RBC 07/01/2018 3.11* 4.22 - 5.81 MIL/uL Final  . Hemoglobin 07/01/2018 9.3* 13.0 - 17.0 g/dL Final  . HCT 11/91/4782 29.3* 39.0 - 52.0 % Final  . MCV 07/01/2018 94.2  80.0 - 100.0 fL Final  . MCH 07/01/2018 29.9  26.0 - 34.0 pg Final  . MCHC 07/01/2018 31.7  30.0 - 36.0 g/dL Final  . RDW 95/62/1308 17.4* 11.5 - 15.5 % Final  . Platelets 07/01/2018 184  150 - 400 K/uL Final  . nRBC 07/01/2018 0.9* 0.0 - 0.2 % Final   Performed at [facility], [address] [place], [place] [address]  . Troponin I 07/01/2018 <0.03   <0.03 ng/mL Final   Performed at [facility], [address] [place], [place] [address]  . Prothrombin Time 07/01/2018 12.8  11.4 - 15.2 seconds Final  . INR 07/01/2018 1.0  0.8 - 1.2 Final   Comment: (NOTE) INR goal varies based on device and disease states. Performed at [facility], [address], [place], [place] [address]   . Alcohol, Ethyl (B) 07/01/2018 473* <10 mg/dL Final   Comment: CRITICAL RESULT CALLED TO, READ BACK BY AND VERIFIED WITH AMY BOISVERT 07/01/18 @ 2204  MLK (NOTE) Lowest detectable limit for serum alcohol is 10 mg/dL. For medical purposes only. Performed at [facility], [address] [place], [place] [address]   . Lipase 07/01/2018 40  11 - 51 U/L Final   Performed at [facility], [address] [place], [place] [address]  . Total Protein 07/01/2018 8.0  6.5 - 8.1 g/dL Final  . Albumin 53/66/4403 4.2  3.5 - 5.0 g/dL Final  . AST 47/42/5956 80* 15 - 41 U/L Final  . ALT 07/01/2018 38  0 - 44 U/L Final  . Alkaline Phosphatase 07/01/2018 102  38 - 126 U/L Final  . Total Bilirubin 07/01/2018 0.5  0.3 - 1.2 mg/dL Final  . Bilirubin, Direct 07/01/2018 <0.1  0.0 - 0.2 mg/dL Final  . Indirect Bilirubin 07/01/2018 NOT CALCULATED  0.3 - 0.9 mg/dL Final   Performed at [facility], [address] [place], [place] [address]  . SARS Coronavirus 2 07/01/2018 NEGATIVE  NEGATIVE Final   Comment: (NOTE) If result is NEGATIVE SARS-CoV-2 target nucleic acids are NOT DETECTED. The SARS-CoV-2 RNA is generally detectable in upper and lower  respiratory specimens during the acute phase of infection. The lowest  concentration of SARS-CoV-2 viral copies this assay can detect is 250  copies / mL. A negative result does not preclude SARS-CoV-2 infection  and should not be used as the sole basis for treatment or other  patient management decisions.  A negative result may occur with  improper specimen collection / handling,  submission of specimen other  than nasopharyngeal swab, presence of viral mutation(s) within the  areas targeted by this assay, and inadequate number of viral copies  (<250 copies / mL). A negative result must be combined with clinical  observations,  patient history, and epidemiological information. If result is POSITIVE SARS-CoV-2 target nucleic acids are DETECTED. The SARS-CoV-2 RNA is generally detectable in upper and lower  respiratory specimens dur                          ing the acute phase of infection.  Positive  results are indicative of active infection with SARS-CoV-2.  Clinical  correlation with patient history and other diagnostic information is  necessary to determine patient infection status.  Positive results do  not rule out bacterial infection or co-infection with other viruses. If result is PRESUMPTIVE POSTIVE SARS-CoV-2 nucleic acids MAY BE PRESENT.   A presumptive positive result was obtained on the submitted specimen  and confirmed on repeat testing.  While 2019 novel coronavirus  (SARS-CoV-2) nucleic acids may be present in the submitted sample  additional confirmatory testing may be necessary for epidemiological  and / or clinical management purposes  to differentiate between  SARS-CoV-2 and other Sarbecovirus currently known to infect humans.  If clinically indicated additional testing with an alternate test  methodology [phone]) is advised. The SARS-CoV-2 RNA is generally  detectable in upper and lower respiratory sp                          ecimens during the acute  phase of infection. The expected result is Negative. Fact Sheet for Patients:  BoilerBrush.com.cyhttps://www.fda.gov/media/136312/download Fact Sheet for Healthcare Providers: https://pope.com/https://www.fda.gov/media/136313/download This test is not yet approved or cleared by the [place] FDA and has been authorized for detection and/or diagnosis of SARS-CoV-2 by FDA under an Emergency Use Authorization (EUA).  This EUA  will remain in effect (meaning this test can be used) for the duration of the COVID-19 declaration under Section 564(b)(1) of the Act, 21 U.S.C. section 360bbb-3(b)(1), unless the authorization is terminated or revoked sooner. Performed at [facility], [address] [place], [place] [address]   . ABO/RH(D) 07/01/2018 O POS   Final  . Antibody Screen 07/01/2018 NEG   Final  . Sample Expiration 07/01/2018    Final                   Value:07/04/2018,2359 Performed at [facility], [address] [place], [place] [address]   . Total Protein 07/01/2018 7.6  6.5 - 8.1 g/dL Final  . Albumin 95/62/130806/04/2018 4.1  3.5 - 5.0 g/dL Final  . AST 65/78/469606/04/2018 66* 15 - 41 U/L Final  . ALT 07/01/2018 35  0 - 44 U/L Final  . Alkaline Phosphatase 07/01/2018 101  38 - 126 U/L Final  . Total Bilirubin 07/01/2018 0.3  0.3 - 1.2 mg/dL Final  . Bilirubin, Direct 07/01/2018 <0.1  0.0 - 0.2 mg/dL Final  . Indirect Bilirubin 07/01/2018 NOT CALCULATED  0.3 - 0.9 mg/dL Final   Performed at [facility], [address] [place], [place] [address]  . Hgb A1c MFr Bld 07/01/2018 6.0* 4.8 - 5.6 % Final   Comment: (NOTE)         Prediabetes: 5.7 - 6.4         Diabetes: >6.4         Glycemic control for adults with diabetes: <7.0   . Mean Plasma Glucose 07/01/2018 126  mg/dL Final   Comment: (NOTE) Performed At: [facility] [address] [place], [place] [id number] Jolene SchimkeNagendra Sanjai MD UV:[phone]:[phone]   . Sodium 07/02/2018 143  135 -  145 mmol/L Final  . Potassium 07/02/2018 4.2  3.5 - 5.1 mmol/L Final  . Chloride 07/02/2018 108  98 - 111 mmol/L Final  . CO2 07/02/2018 21* 22 - 32 mmol/L Final  . Glucose, Bld 07/02/2018 92  70 - 99 mg/dL Final  . BUN 16/10/9602 26* 6 - 20 mg/dL Final  . Creatinine, Ser 07/02/2018 1.25* 0.61 - 1.24 mg/dL Final  . Calcium 54/09/8117 8.4* 8.9 - 10.3 mg/dL Final  . GFR calc non Af Amer 07/02/2018 >60  >60 mL/min Final  . GFR calc Af Amer 07/02/2018  >60  >60 mL/min Final  . Anion gap 07/02/2018 14  5 - 15 Final   Performed at [facility], [address] [place], [place] [address]  . WBC 07/02/2018 2.8* 4.0 - 10.5 K/uL Final  . RBC 07/02/2018 2.81* 4.22 - 5.81 MIL/uL Final  . Hemoglobin 07/02/2018 8.4* 13.0 - 17.0 g/dL Final  . HCT 95/62/1308 26.1* 39.0 - 52.0 % Final  . MCV 07/02/2018 92.9  80.0 - 100.0 fL Final  . MCH 07/02/2018 29.9  26.0 - 34.0 pg Final  . MCHC 07/02/2018 32.2  30.0 - 36.0 g/dL Final  . RDW 65/78/4696 17.2* 11.5 - 15.5 % Final  . Platelets 07/02/2018 154  150 - 400 K/uL Final  . nRBC 07/02/2018 0.0  0.0 - 0.2 % Final   Performed at [facility], [address], [place], [place] [address]  . Hemoglobin 07/01/2018 9.2* 13.0 - 17.0 g/dL Final  . HCT 41/32/4401 28.3* 39.0 - 52.0 % Final   Performed at [facility], [address] [place], [place] [address]  . Hemoglobin 07/02/2018 8.6* 13.0 - 17.0 g/dL Final  . HCT 36/64/4034 26.4* 39.0 - 52.0 % Final   Performed at [facility], [address] [place], [place] [address]  . Vitamin B-12 07/01/2018 593  180 - 914 pg/mL Final   Comment: (NOTE) This assay is not validated for testing neonatal or myeloproliferative syndrome specimens for Vitamin B12 levels. Performed at [facility], [address], [place], [place] [address]   . Hemoglobin 07/02/2018 8.7* 13.0 - 17.0 g/dL Final  . HCT 56/43/3295 27.1* 39.0 - 52.0 % Final   Performed at [facility], [address] [place] [place], [place] [address]  . Glucose-Capillary 07/02/2018 99  70 - 99 mg/dL Final  . Glucose-Capillary 07/02/2018 80  70 - 99 mg/dL Final  . Ferritin 66/06/3014 620* 24 - 336 ng/mL Final   Performed at [facility], [address] [place], [place] [address]  . Iron 07/02/2018 103  45 - 182 ug/dL Final  . TIBC 23/55/7322 230* 250 - 450 ug/dL Final  . Saturation Ratios 07/02/2018 45* 17.9 - 39.5 % Final  . UIBC 07/02/2018 127  ug/dL  Final   Performed at [facility], [address] [place], [place] [address]  . Glucose-Capillary 07/02/2018 93  70 - 99 mg/dL Final  . Comment 1 70/62/3762 Notify RN   Final    Assessment:  George Gomez is a 58 y.o. male with mild pancytopenia.  CBCs from 07/31/2013 - 09/03/2017 reveal a hemoglobin from 10.1 - 14.7 without trend. MCV has been normal.  Platelets have ranged between 111,000 - 190,000.  WBC has ranged between 2000 - 8300.  WBC has been consistently low since 06/01/2017.  He drinks alcohol daily.  CBC on 09/03/2017 revealed a hematocrit of 35.4, hemoglobin 11.5, MCV 94.5, platelets 149,000, WBC 2000 with an  ANC of 1000.  Differential included 49% segs, 34% lymphocytes, 14% monocytes, 2% eosinophils, and 1% basophils.  Outside labs note negative/normal peripheral smear, LDH, and SPEP/UPEP.  Work-up on 12/03/2017 revealed a hematocrit of 29.6, hemoglobin 9.3, MCV 97.7, WBC 2600 with an ANC of 1200.  Differential included 48% segs, 36% lymphs, 14% monocytes, 1% eosinophils, and 1% basophils.  .  B12 was 332 (low normal).  Normal studies included:  Creatinine (1.05), LFTs, ferritin (257), iron studies, folate (7.4), TSH, copper, zinc, ANA, hepatitis B core antibody, hepatitis B surface antigen, hepatitis C antibody, and HIV testing.  Retic was 2.2%.  Peripheral smear revealed no abnormalities.  Flow cytometry on 01/23/2019 was negative.  He has B12 deficiency.  He began B12 injections on 12/11/2017 (last 04/13/2018). B12 was 610 and a folate of 9.5.  Abdomen and pelvic CT on 05/30/2017 revealed hepatic steatosis.  Spleen was normal.  He has a history of a GI bleed on 05/30/2017. EGD on 06/01/2017 revealed a normal esophagus, normal stomach, and duodenitis.  Biopsy revealed reactive duodenitis without dysplasia or malignancy.  [facility] from 07/01/2018 - 07/02/2018 with a GI bleed.  EGD on 07/02/2018 revealed alcoholic gastritis but no evidence of acute  bleeding.  Stools were heme positive.  He required a PRBC transfusion. Hemoglobin ranged between 8.4 - 8.7. He was discharged with PPI twice daily.  Symptomatically, he is feeling better.  He denies any melena or hematochezia.  Plan: 1.  Pancytopenia             Interval GI bleed secondary to alcoholic gastritis.  Hemoglobin 8.4.  Platelets 154,000.  White count 2800 on 07/02/2018.             WBC had normalized at last visit.  Plan recheck B12, folate.             Work-up negative: hepatitis B, hepatitis C, HIV, copper, zinc, and flow cytometry.             Etiology secondary to alcohol and B12 deficiency.                       He states that he is slowly cutting back on alcohol intake. 2.  B12 deficiency             Patient [facility] 1[facility]2 months ago.             Folate was 9.5 on 02/19/2018.  Recheck folate with upcoming labs.  Patient [facility].  RTC monthly x 6 for B12. 3.  Upper GI bleed  Review interval hospitalization and EGD.  Follow-up with Dr. Allegra LaiVanga as scheduled. 4.   RTC this week for labs (CBC with diff, CMP, ferritin, retic, folate, sed rate) and B12. 5.   RTC in 1 month for labs (CBC with diff +/- others), and B12. 6.   RTC in 2 months for MD assessment, labs (CBC with diff, ferritin), and B12.  Addendum:  Folate was 4.1 (low).  Patient contacted and instructed to begin folic acid 1 mg a day.  Folate level will be rechecked in 1 month.  I discussed the assessment and treatment plan with the patient.  The patient was provided an opportunity to ask questions and all were answered.  The patient agreed with the plan and demonstrated an understanding of the instructions.  The patient was advised to call back or seek an  in person evaluation if the symptoms worsen or if the condition fails to improve as anticipated.  I provided 10 minutes (11:51 AM - 12:01 PM) of face-to-face video visit time during this this encounter and >  50% was spent counseling as documented under my assessment and plan.  I provided these services from the [facility] office.   Nolon Stalls, MD, PhD  07/05/2018, 11:51 AM  I, Cloyde Reams Dorshimer, am acting as Education administrator for [employer]. Mike Gip, MD, PhD.  I, Melissa C. Mike Gip, MD, have reviewed the above documentation for accuracy and completeness, and I agree with the above.

## 2018-07-05 ENCOUNTER — Ambulatory Visit: Payer: Medicare Other

## 2018-07-05 ENCOUNTER — Inpatient Hospital Stay (HOSPITAL_BASED_OUTPATIENT_CLINIC_OR_DEPARTMENT_OTHER): Payer: Medicare Other | Admitting: Hematology and Oncology

## 2018-07-05 ENCOUNTER — Other Ambulatory Visit: Payer: Medicare Other

## 2018-07-05 ENCOUNTER — Encounter: Payer: Self-pay | Admitting: Hematology and Oncology

## 2018-07-05 DIAGNOSIS — E538 Deficiency of other specified B group vitamins: Secondary | ICD-10-CM | POA: Diagnosis not present

## 2018-07-05 DIAGNOSIS — E1142 Type 2 diabetes mellitus with diabetic polyneuropathy: Secondary | ICD-10-CM

## 2018-07-05 DIAGNOSIS — K921 Melena: Secondary | ICD-10-CM

## 2018-07-05 DIAGNOSIS — D649 Anemia, unspecified: Secondary | ICD-10-CM | POA: Diagnosis not present

## 2018-07-05 DIAGNOSIS — F1010 Alcohol abuse, uncomplicated: Secondary | ICD-10-CM

## 2018-07-05 NOTE — Progress Notes (Signed)
No new changes noted today. The patient Name and DOB has been verified by phone today. 

## 2018-07-06 LAB — SURGICAL PATHOLOGY

## 2018-07-07 ENCOUNTER — Encounter: Payer: Self-pay | Admitting: Gastroenterology

## 2018-07-13 ENCOUNTER — Inpatient Hospital Stay: Payer: Medicare Other | Attending: Hematology and Oncology

## 2018-07-13 ENCOUNTER — Inpatient Hospital Stay: Payer: Medicare Other

## 2018-07-13 ENCOUNTER — Other Ambulatory Visit: Payer: Self-pay

## 2018-07-13 DIAGNOSIS — R5383 Other fatigue: Secondary | ICD-10-CM | POA: Diagnosis not present

## 2018-07-13 DIAGNOSIS — K922 Gastrointestinal hemorrhage, unspecified: Secondary | ICD-10-CM | POA: Diagnosis present

## 2018-07-13 DIAGNOSIS — Z809 Family history of malignant neoplasm, unspecified: Secondary | ICD-10-CM | POA: Diagnosis not present

## 2018-07-13 DIAGNOSIS — E119 Type 2 diabetes mellitus without complications: Secondary | ICD-10-CM | POA: Diagnosis not present

## 2018-07-13 DIAGNOSIS — E538 Deficiency of other specified B group vitamins: Secondary | ICD-10-CM

## 2018-07-13 DIAGNOSIS — K2920 Alcoholic gastritis without bleeding: Secondary | ICD-10-CM | POA: Diagnosis not present

## 2018-07-13 DIAGNOSIS — R202 Paresthesia of skin: Secondary | ICD-10-CM | POA: Insufficient documentation

## 2018-07-13 DIAGNOSIS — D500 Iron deficiency anemia secondary to blood loss (chronic): Secondary | ICD-10-CM | POA: Diagnosis not present

## 2018-07-13 DIAGNOSIS — Z87442 Personal history of urinary calculi: Secondary | ICD-10-CM | POA: Diagnosis not present

## 2018-07-13 DIAGNOSIS — Z833 Family history of diabetes mellitus: Secondary | ICD-10-CM | POA: Diagnosis not present

## 2018-07-13 DIAGNOSIS — D61818 Other pancytopenia: Secondary | ICD-10-CM | POA: Insufficient documentation

## 2018-07-13 DIAGNOSIS — F1020 Alcohol dependence, uncomplicated: Secondary | ICD-10-CM | POA: Insufficient documentation

## 2018-07-13 DIAGNOSIS — Z8249 Family history of ischemic heart disease and other diseases of the circulatory system: Secondary | ICD-10-CM | POA: Diagnosis not present

## 2018-07-13 DIAGNOSIS — Z8261 Family history of arthritis: Secondary | ICD-10-CM | POA: Diagnosis not present

## 2018-07-13 DIAGNOSIS — K921 Melena: Secondary | ICD-10-CM

## 2018-07-13 DIAGNOSIS — R634 Abnormal weight loss: Secondary | ICD-10-CM | POA: Diagnosis not present

## 2018-07-13 DIAGNOSIS — D649 Anemia, unspecified: Secondary | ICD-10-CM

## 2018-07-13 LAB — RETICULOCYTES
Immature Retic Fract: 27.3 % — ABNORMAL HIGH (ref 2.3–15.9)
RBC.: 3.02 MIL/uL — ABNORMAL LOW (ref 4.22–5.81)
Retic Count, Absolute: 77 10*3/uL (ref 19.0–186.0)
Retic Ct Pct: 2.6 % (ref 0.4–3.1)

## 2018-07-13 LAB — CBC WITH DIFFERENTIAL/PLATELET
Abs Immature Granulocytes: 0.02 10*3/uL (ref 0.00–0.07)
Basophils Absolute: 0 10*3/uL (ref 0.0–0.1)
Basophils Relative: 1 %
Eosinophils Absolute: 0.1 10*3/uL (ref 0.0–0.5)
Eosinophils Relative: 2 %
HCT: 28.4 % — ABNORMAL LOW (ref 39.0–52.0)
Hemoglobin: 9.1 g/dL — ABNORMAL LOW (ref 13.0–17.0)
Immature Granulocytes: 1 %
Lymphocytes Relative: 27 %
Lymphs Abs: 0.8 10*3/uL (ref 0.7–4.0)
MCH: 30.5 pg (ref 26.0–34.0)
MCHC: 32 g/dL (ref 30.0–36.0)
MCV: 95.3 fL (ref 80.0–100.0)
Monocytes Absolute: 0.4 10*3/uL (ref 0.1–1.0)
Monocytes Relative: 14 %
Neutro Abs: 1.7 10*3/uL (ref 1.7–7.7)
Neutrophils Relative %: 55 %
Platelets: 160 10*3/uL (ref 150–400)
RBC: 2.98 MIL/uL — ABNORMAL LOW (ref 4.22–5.81)
RDW: 17.2 % — ABNORMAL HIGH (ref 11.5–15.5)
WBC: 3 10*3/uL — ABNORMAL LOW (ref 4.0–10.5)
nRBC: 0 % (ref 0.0–0.2)

## 2018-07-13 LAB — COMPREHENSIVE METABOLIC PANEL
ALT: 36 U/L (ref 0–44)
AST: 49 U/L — ABNORMAL HIGH (ref 15–41)
Albumin: 4.1 g/dL (ref 3.5–5.0)
Alkaline Phosphatase: 101 U/L (ref 38–126)
Anion gap: 12 (ref 5–15)
BUN: 19 mg/dL (ref 6–20)
CO2: 21 mmol/L — ABNORMAL LOW (ref 22–32)
Calcium: 9 mg/dL (ref 8.9–10.3)
Chloride: 104 mmol/L (ref 98–111)
Creatinine, Ser: 0.98 mg/dL (ref 0.61–1.24)
GFR calc Af Amer: >60 mL/min (ref >60)
GFR calc non Af Amer: >60 mL/min (ref >60)
Glucose, Bld: 107 mg/dL — ABNORMAL HIGH (ref 70–99)
Potassium: 3.7 mmol/L (ref 3.5–5.1)
Sodium: 137 mmol/L (ref 135–145)
Total Bilirubin: 0.4 mg/dL (ref 0.3–1.2)
Total Protein: 8.1 g/dL (ref 6.5–8.1)

## 2018-07-13 LAB — SEDIMENTATION RATE: Sed Rate: 63 mm/hr — ABNORMAL HIGH (ref 0–20)

## 2018-07-13 LAB — FOLATE: Folate: 4.1 ng/mL — ABNORMAL LOW (ref >5.9)

## 2018-07-13 LAB — FERRITIN: Ferritin: 483 ng/mL — ABNORMAL HIGH (ref 24–336)

## 2018-07-13 MED ORDER — CYANOCOBALAMIN 1000 MCG/ML IJ SOLN
1000.0000 ug | Freq: Once | INTRAMUSCULAR | Status: AC
  Administered 2018-07-13: 1000 ug via INTRAMUSCULAR

## 2018-07-14 ENCOUNTER — Telehealth: Payer: Self-pay

## 2018-07-14 NOTE — Telephone Encounter (Signed)
Spoke with the patient to inform him that his folate is low and he will need to start Folate acid 1 mg and RTC in 1 month for recheck of labs . The patient was understanding and agreeable to get the folate acid today.

## 2018-07-14 NOTE — Telephone Encounter (Signed)
-----   Message from Lequita Asal, MD sent at 07/14/2018  1:00 PM EDT ----- Regarding: Please call patient  Folate is low.  Begin folic acid 1 mg a day.  RTC in 1 month for f/u folate level.  M ----- Message ----- From: Buel Ream, [facility] [place] Sent: 07/13/2018   3:30 PM EDT To: Lequita Asal, MD

## 2018-07-25 DIAGNOSIS — E538 Deficiency of other specified B group vitamins: Secondary | ICD-10-CM

## 2018-07-25 HISTORY — DX: Deficiency of other specified B group vitamins: E53.8

## 2018-08-10 ENCOUNTER — Inpatient Hospital Stay: Payer: Medicare Other

## 2018-08-10 ENCOUNTER — Other Ambulatory Visit: Payer: Self-pay

## 2018-08-10 ENCOUNTER — Inpatient Hospital Stay: Payer: Medicare Other | Attending: Hematology and Oncology

## 2018-08-10 VITALS — Temp 98.6°F

## 2018-08-10 DIAGNOSIS — Z8261 Family history of arthritis: Secondary | ICD-10-CM | POA: Insufficient documentation

## 2018-08-10 DIAGNOSIS — R634 Abnormal weight loss: Secondary | ICD-10-CM | POA: Insufficient documentation

## 2018-08-10 DIAGNOSIS — E538 Deficiency of other specified B group vitamins: Secondary | ICD-10-CM

## 2018-08-10 DIAGNOSIS — R208 Other disturbances of skin sensation: Secondary | ICD-10-CM | POA: Diagnosis not present

## 2018-08-10 DIAGNOSIS — R5383 Other fatigue: Secondary | ICD-10-CM | POA: Insufficient documentation

## 2018-08-10 DIAGNOSIS — Z809 Family history of malignant neoplasm, unspecified: Secondary | ICD-10-CM | POA: Diagnosis not present

## 2018-08-10 DIAGNOSIS — D61818 Other pancytopenia: Secondary | ICD-10-CM | POA: Diagnosis not present

## 2018-08-10 DIAGNOSIS — D649 Anemia, unspecified: Secondary | ICD-10-CM | POA: Insufficient documentation

## 2018-08-10 DIAGNOSIS — R202 Paresthesia of skin: Secondary | ICD-10-CM | POA: Insufficient documentation

## 2018-08-10 DIAGNOSIS — K922 Gastrointestinal hemorrhage, unspecified: Secondary | ICD-10-CM | POA: Diagnosis not present

## 2018-08-10 DIAGNOSIS — Z79899 Other long term (current) drug therapy: Secondary | ICD-10-CM | POA: Insufficient documentation

## 2018-08-10 DIAGNOSIS — Z8249 Family history of ischemic heart disease and other diseases of the circulatory system: Secondary | ICD-10-CM | POA: Diagnosis not present

## 2018-08-10 LAB — CBC WITH DIFFERENTIAL/PLATELET
Abs Immature Granulocytes: 0.04 10*3/uL (ref 0.00–0.07)
Basophils Absolute: 0 10*3/uL (ref 0.0–0.1)
Basophils Relative: 1 %
Eosinophils Absolute: 0.1 10*3/uL (ref 0.0–0.5)
Eosinophils Relative: 2 %
HCT: 28.9 % — ABNORMAL LOW (ref 39.0–52.0)
Hemoglobin: 9.2 g/dL — ABNORMAL LOW (ref 13.0–17.0)
Immature Granulocytes: 1 %
Lymphocytes Relative: 22 %
Lymphs Abs: 0.9 10*3/uL (ref 0.7–4.0)
MCH: 29.9 pg (ref 26.0–34.0)
MCHC: 31.8 g/dL (ref 30.0–36.0)
MCV: 93.8 fL (ref 80.0–100.0)
Monocytes Absolute: 0.7 10*3/uL (ref 0.1–1.0)
Monocytes Relative: 15 %
Neutro Abs: 2.5 10*3/uL (ref 1.7–7.7)
Neutrophils Relative %: 59 %
Platelets: 136 10*3/uL — ABNORMAL LOW (ref 150–400)
RBC: 3.08 MIL/uL — ABNORMAL LOW (ref 4.22–5.81)
RDW: 16.4 % — ABNORMAL HIGH (ref 11.5–15.5)
WBC: 4.2 10*3/uL (ref 4.0–10.5)
nRBC: 0.7 % — ABNORMAL HIGH (ref 0.0–0.2)

## 2018-08-10 MED ORDER — CYANOCOBALAMIN 1000 MCG/ML IJ SOLN
1000.0000 ug | Freq: Once | INTRAMUSCULAR | Status: AC
  Administered 2018-08-10: 1000 ug via INTRAMUSCULAR

## 2018-08-10 NOTE — Patient Instructions (Signed)

## 2018-08-10 NOTE — Progress Notes (Signed)
Patient reports that he has had some dark stools lately, had a cough that made him vomit one time then he felt better.  He said it "may have been my mom's cooking".  He said that he has had some nausea and headaches, no loss of taste or smell, has not fallen recently, and has had some indegestion.  He reports that he is still drinking ETOH.  Patient was advised that if he develops a fever, loses his sense of smell or taste, becomes short of breath or has fatigue that he needs to be seen in the ER.  Patient voiced understanding.

## 2018-08-11 LAB — FOLATE: Folate: 8.9 ng/mL (ref >5.9)

## 2018-09-08 ENCOUNTER — Other Ambulatory Visit: Payer: Self-pay | Admitting: Hematology and Oncology

## 2018-09-08 ENCOUNTER — Ambulatory Visit: Payer: Medicare Other | Admitting: Hematology and Oncology

## 2018-09-08 ENCOUNTER — Other Ambulatory Visit: Payer: Medicare Other

## 2018-09-08 ENCOUNTER — Ambulatory Visit: Payer: Medicare Other

## 2018-09-08 DIAGNOSIS — D649 Anemia, unspecified: Secondary | ICD-10-CM

## 2018-09-08 NOTE — Progress Notes (Signed)
[facility] [facility]  [address], [address] [place], [place] [address] Phone: [phone]  Fax: [phone]   Clinic Day:  09/09/2018  Referring physician: Tula NakayamaFricke, Paige Lauren Hu*  Chief Complaint: George Gomez is a 58 y.o. male with anemia and B12 deficiency who is seen for 2 month assessment.  HPI: The patient was last seen in the hematology clinic on 07/05/2018. At that time, he was feeling better. He denied any melena or hematochezia.  He received B-12.  He received a B-12 injection on 08/10/2018.   He began folic acid on 13/08/657806/17/2020.  Labs followed: 07/13/2018: Hematocrit 28.4, hemoglobin 9.1, MCV 95.3, platelets 160,000, WBC 3,000.  Retic 2.6%.  Ferritin 483. Sed rate 63. Folate 4.1 (low). 08/10/2018: Hematocrit 28.9, hemoglobin 9.2, MCV 98.3, platelets 136,000, WBC 4,200.  Folate 8.9.   During the interim, the patient feels "alright". For1 week, he reports having having the hiccups. He notes a good energy level, but is tired from working hard yesterday. He notes constipation and melena 1 week ago.  He is not taking any oral iron.  He denies any abdominal pain. He denies any urinary problems.  He weight is down 16 lbs since 03/19/2018. His normal weight is about 216 pounds and today he is 208 pounds. He reports a decrease in appetite.  He drinks corn liquor twice a day, which is "slowed down" from what he used to drink. He reports that when he works more, he drinks less.    Past Medical History:  Diagnosis Date  . Anemia   . Diabetes mellitus without complication (HCC)   . Diverticulitis    Pt states diverticulitis  . EtOH dependence (HCC)   . Hypercholesteremia   . Hypertension     Past Surgical History:  Procedure Laterality Date  . ESOPHAGOGASTRODUODENOSCOPY N/A 07/02/2018   Procedure: ESOPHAGOGASTRODUODENOSCOPY (EGD);  Surgeon: Toney ReilVanga, Rohini Reddy, MD;  [facility];  Service: Gastroenterology;  Laterality: N/A;  .  ESOPHAGOGASTRODUODENOSCOPY (EGD) WITH PROPOFOL N/A 06/01/2017   Procedure: ESOPHAGOGASTRODUODENOSCOPY (EGD) WITH PROPOFOL;  Surgeon: Wyline MoodAnna, Kiran, MD;  [facility];  Service: Gastroenterology;  Laterality: N/A;  . none      Family History  Problem Relation Age of Onset  . Rheum arthritis Mother   . Diabetes Father   . Heart disease Father   . Cancer Father   . Cancer Maternal Grandfather   . Cancer Paternal Grandfather     Social History:  reports that he has never smoked. He has never used smokeless tobacco. He reports current alcohol use of about 6.0 standard drinks of alcohol per week. He reports that he does not use drugs. He drinks liquor 2-3 x/day. He is drinking less than he did in the past (moonshine), about 2 shots daily. He did "crack" 10-15 years ago. [place] in [place]. The patient is alone today.  Allergies: No Known Allergies  Current Medications: Current Outpatient Medications  Medication Sig Dispense Refill  . allopurinol (ZYLOPRIM) 300 MG tablet Take 300 mg by mouth daily.     Marland Kitchen. amLODipine (NORVASC) 5 MG tablet Take 5 mg by mouth daily.     Marland Kitchen. atorvastatin (LIPITOR) 40 MG tablet Take 40 mg by mouth daily at 6 PM.     . gabapentin (NEURONTIN) 300 MG capsule Take 300 mg by mouth 2 (two) times daily.  3  . lisinopril (PRINIVIL,ZESTRIL) 10 MG tablet Take 1 tablet by mouth daily.  11  . losartan (COZAAR) 100 MG tablet Take 100 mg by  mouth daily.     . metFORMIN (GLUCOPHAGE-XR) 500 MG 24 hr tablet Take 2 tablets by mouth daily.    . naltrexone (DEPADE) 50 MG tablet Take 50 mg by mouth daily.     . pantoprazole (PROTONIX) 40 MG tablet Take 1 tablet (40 mg total) by mouth 2 (two) times daily before a meal. (Patient taking differently: Take 40 mg by mouth daily. ) 60 tablet 1  . propranolol ER (INDERAL LA) 60 MG 24 hr capsule Take 60 mg by mouth daily.      No current facility-administered medications for this visit.     Review of Systems  Constitutional:  Positive for malaise/fatigue and weight loss (down 16 lbs; 208 lbs today). Negative for chills and fever.       Feels "alright".  HENT: Negative.  Negative for congestion, ear pain, hearing loss, nosebleeds, sinus pain and sore throat.   Eyes: Negative.  Negative for blurred vision and double vision.  Respiratory: Negative.  Negative for cough, hemoptysis and shortness of breath.   Cardiovascular: Negative.  Negative for chest pain, palpitations and orthopnea.  Gastrointestinal: Positive for constipation and melena. Negative for abdominal pain, diarrhea, heartburn, nausea and vomiting.       Singultus.  Genitourinary: Negative for dysuria, frequency and urgency.       H/o kidney stones.  Musculoskeletal: Negative.  Negative for back pain, joint pain and myalgias.  Skin: Negative for itching and rash.  Neurological: Negative.  Negative for dizziness, tingling, sensory change, focal weakness, weakness and headaches.  Endo/Heme/Allergies: Does not bruise/bleed easily.       Diabetes.  Psychiatric/Behavioral: Negative.  Negative for depression and memory loss. The patient is not nervous/anxious and does not have insomnia.   All other systems reviewed and are negative.  Performance status (ECOG): 1  Vitals Blood pressure 118/70, pulse 78, temperature (!) 97.3 F (36.3 C), resp. rate 18, height 5\' 11"  (1.803 m), weight 208 lb 14.2 oz (94.7 kg), SpO2 100 %.  Physical Exam  Constitutional: He is oriented to person, place, and time. He appears well-developed and well-nourished. No distress.  HENT:  Head: Normocephalic and atraumatic.  Mouth/Throat: Oropharynx is clear and moist. No oropharyngeal exudate.  Wearing a cap.  Berneice GandyGray goatee.  Eyes: Pupils are equal, round, and reactive to light. Conjunctivae and EOM are normal. No scleral icterus.  Brown eyes.   Neck: Normal range of motion. Neck supple. No JVD present.  Cardiovascular: Normal rate, regular rhythm and normal heart sounds.  No murmur  heard. Pulmonary/Chest: Effort normal and breath sounds normal. No respiratory distress. He has no wheezes. He has no rales. He exhibits no tenderness.  Abdominal: Soft. Bowel sounds are normal. He exhibits no distension and no mass. There is no rebound and no guarding.  Musculoskeletal: Normal range of motion.        General: No tenderness or edema.  Lymphadenopathy:    He has no cervical adenopathy.    He has no axillary adenopathy.       Right: No supraclavicular adenopathy present.       Left: No supraclavicular adenopathy present.  Neurological: He is alert and oriented to person, place, and time.  Skin: Skin is warm and dry. No rash noted. No erythema. No pallor.  Psychiatric: He has a normal mood and affect. His behavior is normal. Judgment and thought content normal.  Nursing note and vitals reviewed.   Appointment on 09/09/2018  Component Date Value Ref Range Status  . WBC  09/09/2018 3.5* 4.0 - 10.5 K/uL Final  . RBC 09/09/2018 2.78* 4.22 - 5.81 MIL/uL Final  . Hemoglobin 09/09/2018 8.3* 13.0 - 17.0 g/dL Final  . HCT 09/09/2018 25.1* 39.0 - 52.0 % Final  . MCV 09/09/2018 90.3  80.0 - 100.0 fL Final  . MCH 09/09/2018 29.9  26.0 - 34.0 pg Final  . MCHC 09/09/2018 33.1  30.0 - 36.0 g/dL Final  . RDW 09/09/2018 16.7* 11.5 - 15.5 % Final  . Platelets 09/09/2018 178  150 - 400 K/uL Final  . nRBC 09/09/2018 0.0  0.0 - 0.2 % Final  . Neutrophils Relative % 09/09/2018 45  % Final  . Neutro Abs 09/09/2018 1.6* 1.7 - 7.7 K/uL Final  . Lymphocytes Relative 09/09/2018 31  % Final  . Lymphs Abs 09/09/2018 1.1  0.7 - 4.0 K/uL Final  . Monocytes Relative 09/09/2018 19  % Final  . Monocytes Absolute 09/09/2018 0.7  0.1 - 1.0 K/uL Final  . Eosinophils Relative 09/09/2018 1  % Final  . Eosinophils Absolute 09/09/2018 0.0  0.0 - 0.5 K/uL Final  . Basophils Relative 09/09/2018 1  % Final  . Basophils Absolute 09/09/2018 0.0  0.0 - 0.1 K/uL Final  . Immature Granulocytes 09/09/2018 3  %  Final  . Abs Immature Granulocytes 09/09/2018 0.09* 0.00 - 0.07 K/uL Final   Performed at [facility], [address] [place], [place] [address]    Assessment:  George Gomez is a 58 y.o. male with mild pancytopenia. CBCs from 07/31/2013 - 09/03/2017 reveal a hemoglobinfrom 10.1 - 14.7 without trend. MCV has been normal. Plateletshave ranged between 111,000 - 190,000. WBChas ranged between 2000 - 8300. WBC has been consistently low since 06/01/2017. He drinks alcoholdaily.  CBC on 08/08/2019revealed a hematocrit of 35.4, hemoglobin 11.5, MCV 94.5, platelets 149,000, WBC 2000 with an [place] of 1000. Differential included 49% segs, 34% lymphocytes, 14% monocytes, 2% eosinophils, and 1% basophils. Outside labsnote negative/normal peripheral smear, LDH, and SPEP/UPEP.  Work-up on 11/07/2019revealed a hematocrit of 29.6, hemoglobin 9.3, MCV 97.7, WBC 2600 with an ANC of 1200. Differential included 48% segs, 36% lymphs, 14% monocytes, 1% eosinophils, and 1% basophils. . B12was 332 (low normal). Normal studiesincluded: Creatinine (1.05), LFTs, ferritin (257), iron studies, folate (7.4), TSH, copper, zinc, ANA, hepatitis B core antibody, hepatitis B surface antigen, hepatitis C antibody, and HIV testing. Retic was 2.2%. Peripheral smearrevealed no abnormalities. Flow cytometry on 01/23/2019 was negative.  He has B12 deficiency and folate deficiency. He began B12 injections on 12/11/2017 (last 08/10/2018). Folate was 4.1 on 07/13/2018.  Abdomen and pelvic CTon 05/30/2017 revealed hepatic steatosis. Spleen was normal.  He has a history of a GI bleedon 05/30/2017. EGDon 06/01/2017 revealed a normal esophagus, normal stomach, and duodenitis. Biopsy revealed reactive duodenitis without dysplasia or malignancy.  He was admitted to [facility] from 07/01/2018 - 07/02/2018 with a GI bleed.  EGD on 67/34/1939 revealed alcoholic gastritis but no evidence of acute bleeding.   Stools were heme positive.  He required a PRBC transfusion. Hemoglobin ranged between 8.4 - 8.7. He was discharged with PPI twice daily.  Symptomatically, he feels "alright".  He is not eating much; weight is down 16 pounds.  He describes melena 1 week ago.  Plan: 1.   Labs today:  CBC with diff, CMP, ferritin, iron studies, SPEP, FLCA. 2.   Normocytic anemia  Hematocrit 25.1.  Hemoglobin 8.3 MCV 90.3.  Discuss concern for declining hemoglobin.  Patient has a history of a GI bleed  secondary to alcoholic gastritis.  Patient notes melena 1 week ago (none since).  Patient drinking corn liquor 2x/day.  Ferritin 551 with an iron saturation of 27% and a TIBC of 233. Additional work-up today:   SPEP revealed no M-spike.   Kappa free light chains 46.2, lambda free light chains 41.3 and ratio 1.12 (normal).    No monoclonal gammopathy.   Retic 1.9%.  LDH 174 (normal).  Haptoglobin 353 (normal).    No evidence of hemolysis.    Retic count inappropriately low.  Concern for alcohol induced myelosuppression.   Creatinine 1.62 (prior value 0.98 on 07/13/2018).    Creatinine has fluctuated between 0.98 - 1.78 in the past year.             B12 was 593 on 07/01/2018.  Folate was 8.9 on 08/10/2018. Work-up negative: hepatitis B, hepatitis C, HIV, copper, zinc, and flow cytometry. Etiology possibly secondary to alcohol and GI bleed given melena.   Encourage cutting back on alcohol.  Discuss possible marrow if counts do not improve and if no documented GI bleeding.  3. B12 deficiency Patient receiving B12 monthly in clinic.  B12 was 593 on 07/01/2018. B12 today and monthly x 6. 4.Upper GI bleed             Patient has a history of GI bleed in 05/2017 and 06/2018.  Patient notes an isolated episode of melena 1 week ago (none since).  Discuss importance of taking Protonix and cutting back alcohol.  Follow-up with Dr Allegra LaiVanga. 5.    Mild leukopenia  Etiology likely secondary to alcohol given extensive work-up.  Possible marrow as above. 6.   Renal insufficiency  Creatinine 1.62 (prior value 0.98 on 07/13/2018).  Creatinine has fluctuated between 0.98 - 1.78 in the past year.  Patient denies any new medication or herbal products.  Consider renal ultrasound and urinalysis.  Follow-up with PCP. 7.   Weight loss  Etiology unclear.  Patient denies any concern.  Patient notes normal weight 216-217 pounds.  Patient notes not thinking about alcohol when working. 8.   RTC in 2 weeks for labs (CBC, hold tube). 9.   RTC in 4 weeks for MD assessment, labs (CBC with diff, hold tube, +/- others), and B12.  I discussed the assessment and treatment plan with the patient.  The patient was provided an opportunity to ask questions and all were answered.  The patient agreed with the plan and demonstrated an understanding of the instructions.  The patient was advised to call back if the symptoms worsen or if the condition fails to improve as anticipated.   Rosey BathMelissa C Corcoran, MD, PhD    09/09/2018, 9:07 AM  I, Theador HawthorneAlexis Patterson, am acting as scribe for General MotorsMelissa C. Merlene Pullingorcoran, MD, PhD.  I, Melissa C. Merlene Pullingorcoran, MD, have reviewed the above documentation for accuracy and completeness, and I agree with the above.

## 2018-09-09 ENCOUNTER — Encounter: Payer: Self-pay | Admitting: Hematology and Oncology

## 2018-09-09 ENCOUNTER — Other Ambulatory Visit: Payer: Self-pay | Admitting: Hematology and Oncology

## 2018-09-09 ENCOUNTER — Inpatient Hospital Stay (HOSPITAL_BASED_OUTPATIENT_CLINIC_OR_DEPARTMENT_OTHER): Payer: Medicare Other | Admitting: Hematology and Oncology

## 2018-09-09 ENCOUNTER — Other Ambulatory Visit: Payer: Self-pay

## 2018-09-09 ENCOUNTER — Telehealth: Payer: Self-pay

## 2018-09-09 ENCOUNTER — Inpatient Hospital Stay: Payer: Medicare Other | Attending: Hematology and Oncology

## 2018-09-09 ENCOUNTER — Inpatient Hospital Stay: Payer: Medicare Other

## 2018-09-09 VITALS — BP 118/70 | HR 78 | Temp 97.3°F | Resp 18 | Ht 71.0 in | Wt 208.9 lb

## 2018-09-09 DIAGNOSIS — E538 Deficiency of other specified B group vitamins: Secondary | ICD-10-CM

## 2018-09-09 DIAGNOSIS — Z833 Family history of diabetes mellitus: Secondary | ICD-10-CM | POA: Insufficient documentation

## 2018-09-09 DIAGNOSIS — I10 Essential (primary) hypertension: Secondary | ICD-10-CM | POA: Insufficient documentation

## 2018-09-09 DIAGNOSIS — F1010 Alcohol abuse, uncomplicated: Secondary | ICD-10-CM

## 2018-09-09 DIAGNOSIS — D696 Thrombocytopenia, unspecified: Secondary | ICD-10-CM | POA: Insufficient documentation

## 2018-09-09 DIAGNOSIS — E7800 Pure hypercholesterolemia, unspecified: Secondary | ICD-10-CM | POA: Insufficient documentation

## 2018-09-09 DIAGNOSIS — R066 Hiccough: Secondary | ICD-10-CM | POA: Diagnosis not present

## 2018-09-09 DIAGNOSIS — R634 Abnormal weight loss: Secondary | ICD-10-CM | POA: Diagnosis not present

## 2018-09-09 DIAGNOSIS — E119 Type 2 diabetes mellitus without complications: Secondary | ICD-10-CM | POA: Insufficient documentation

## 2018-09-09 DIAGNOSIS — N289 Disorder of kidney and ureter, unspecified: Secondary | ICD-10-CM | POA: Insufficient documentation

## 2018-09-09 DIAGNOSIS — D649 Anemia, unspecified: Secondary | ICD-10-CM

## 2018-09-09 DIAGNOSIS — E785 Hyperlipidemia, unspecified: Secondary | ICD-10-CM | POA: Diagnosis not present

## 2018-09-09 DIAGNOSIS — D61818 Other pancytopenia: Secondary | ICD-10-CM | POA: Diagnosis not present

## 2018-09-09 DIAGNOSIS — Z7984 Long term (current) use of oral hypoglycemic drugs: Secondary | ICD-10-CM | POA: Insufficient documentation

## 2018-09-09 DIAGNOSIS — Z79899 Other long term (current) drug therapy: Secondary | ICD-10-CM | POA: Diagnosis not present

## 2018-09-09 DIAGNOSIS — Z8249 Family history of ischemic heart disease and other diseases of the circulatory system: Secondary | ICD-10-CM | POA: Diagnosis not present

## 2018-09-09 DIAGNOSIS — K921 Melena: Secondary | ICD-10-CM

## 2018-09-09 LAB — FERRITIN: Ferritin: 551 ng/mL — ABNORMAL HIGH (ref 24–336)

## 2018-09-09 LAB — CBC WITH DIFFERENTIAL/PLATELET
Abs Immature Granulocytes: 0.09 10*3/uL — ABNORMAL HIGH (ref 0.00–0.07)
Basophils Absolute: 0 10*3/uL (ref 0.0–0.1)
Basophils Relative: 1 %
Eosinophils Absolute: 0 10*3/uL (ref 0.0–0.5)
Eosinophils Relative: 1 %
HCT: 25.1 % — ABNORMAL LOW (ref 39.0–52.0)
Hemoglobin: 8.3 g/dL — ABNORMAL LOW (ref 13.0–17.0)
Immature Granulocytes: 3 %
Lymphocytes Relative: 31 %
Lymphs Abs: 1.1 10*3/uL (ref 0.7–4.0)
MCH: 29.9 pg (ref 26.0–34.0)
MCHC: 33.1 g/dL (ref 30.0–36.0)
MCV: 90.3 fL (ref 80.0–100.0)
Monocytes Absolute: 0.7 10*3/uL (ref 0.1–1.0)
Monocytes Relative: 19 %
Neutro Abs: 1.6 10*3/uL — ABNORMAL LOW (ref 1.7–7.7)
Neutrophils Relative %: 45 %
Platelets: 178 10*3/uL (ref 150–400)
RBC: 2.78 MIL/uL — ABNORMAL LOW (ref 4.22–5.81)
RDW: 16.7 % — ABNORMAL HIGH (ref 11.5–15.5)
WBC: 3.5 10*3/uL — ABNORMAL LOW (ref 4.0–10.5)
nRBC: 0 % (ref 0.0–0.2)

## 2018-09-09 LAB — COMPREHENSIVE METABOLIC PANEL
ALT: 23 U/L (ref 0–44)
AST: 38 U/L (ref 15–41)
Albumin: 3.7 g/dL (ref 3.5–5.0)
Alkaline Phosphatase: 93 U/L (ref 38–126)
Anion gap: 11 (ref 5–15)
BUN: 26 mg/dL — ABNORMAL HIGH (ref 6–20)
CO2: 26 mmol/L (ref 22–32)
Calcium: 9 mg/dL (ref 8.9–10.3)
Chloride: 102 mmol/L (ref 98–111)
Creatinine, Ser: 1.61 mg/dL — ABNORMAL HIGH (ref 0.61–1.24)
GFR calc Af Amer: 53 mL/min — ABNORMAL LOW (ref >60)
GFR calc non Af Amer: 46 mL/min — ABNORMAL LOW (ref >60)
Glucose, Bld: 130 mg/dL — ABNORMAL HIGH (ref 70–99)
Potassium: 3.3 mmol/L — ABNORMAL LOW (ref 3.5–5.1)
Sodium: 139 mmol/L (ref 135–145)
Total Bilirubin: 0.7 mg/dL (ref 0.3–1.2)
Total Protein: 7.4 g/dL (ref 6.5–8.1)

## 2018-09-09 LAB — LACTATE DEHYDROGENASE: LDH: 174 U/L (ref 98–192)

## 2018-09-09 LAB — IRON AND TIBC
Iron: 63 ug/dL (ref 45–182)
Saturation Ratios: 27 % (ref 17.9–39.5)
TIBC: 233 ug/dL — ABNORMAL LOW (ref 250–450)
UIBC: 170 ug/dL

## 2018-09-09 LAB — RETICULOCYTES
Immature Retic Fract: 23.6 % — ABNORMAL HIGH (ref 2.3–15.9)
RBC.: 2.82 MIL/uL — ABNORMAL LOW (ref 4.22–5.81)
Retic Count, Absolute: 52.5 10*3/uL (ref 19.0–186.0)
Retic Ct Pct: 1.9 % (ref 0.4–3.1)

## 2018-09-09 MED ORDER — CYANOCOBALAMIN 1000 MCG/ML IJ SOLN
1000.0000 ug | Freq: Once | INTRAMUSCULAR | Status: AC
  Administered 2018-09-09: 1000 ug via INTRAMUSCULAR

## 2018-09-09 NOTE — Progress Notes (Signed)
No new  Changes noted today 

## 2018-09-09 NOTE — Telephone Encounter (Signed)
-----   Message from Lequita Asal, MD sent at 09/09/2018  4:51 PM EDT ----- Regarding: Please send labs to PCP  Creatinine has increased.  M ----- Message ----- From: Buel Ream, [facility] [place] Sent: 09/09/2018   8:52 AM EDT To: Lequita Asal, MD

## 2018-09-09 NOTE — Telephone Encounter (Signed)
Routed the patient labs to his PCP today , Per Dr Mike Gip.

## 2018-09-10 LAB — KAPPA/LAMBDA LIGHT CHAINS
Kappa free light chain: 46.2 mg/L — ABNORMAL HIGH (ref 3.3–19.4)
Kappa, lambda light chain ratio: 1.12 (ref 0.26–1.65)
Lambda free light chains: 41.3 mg/L — ABNORMAL HIGH (ref 5.7–26.3)

## 2018-09-10 LAB — HAPTOGLOBIN: Haptoglobin: 353 mg/dL (ref 29–370)

## 2018-09-12 LAB — MULTIPLE MYELOMA PANEL, SERUM
Albumin SerPl Elph-Mcnc: 3.7 g/dL (ref 2.9–4.4)
Albumin/Glob SerPl: 1.3 (ref 0.7–1.7)
Alpha 1: 0.2 g/dL (ref 0.0–0.4)
Alpha2 Glob SerPl Elph-Mcnc: 1 g/dL (ref 0.4–1.0)
B-Globulin SerPl Elph-Mcnc: 0.8 g/dL (ref 0.7–1.3)
Gamma Glob SerPl Elph-Mcnc: 0.8 g/dL (ref 0.4–1.8)
Globulin, Total: 2.9 g/dL (ref 2.2–3.9)
IgA: 203 mg/dL (ref 90–386)
IgG (Immunoglobin G), Serum: 1142 mg/dL (ref 603–1613)
IgM (Immunoglobulin M), Srm: 79 mg/dL (ref 20–172)
Total Protein ELP: 6.6 g/dL (ref 6.0–8.5)

## 2018-09-15 DIAGNOSIS — N289 Disorder of kidney and ureter, unspecified: Secondary | ICD-10-CM | POA: Insufficient documentation

## 2018-09-15 DIAGNOSIS — R634 Abnormal weight loss: Secondary | ICD-10-CM

## 2018-09-15 HISTORY — DX: Abnormal weight loss: R63.4

## 2018-09-22 ENCOUNTER — Other Ambulatory Visit: Payer: Self-pay

## 2018-09-23 ENCOUNTER — Inpatient Hospital Stay: Payer: Medicare Other

## 2018-10-02 ENCOUNTER — Other Ambulatory Visit: Payer: Self-pay

## 2018-10-02 ENCOUNTER — Emergency Department
Admission: EM | Admit: 2018-10-02 | Discharge: 2018-10-02 | Disposition: A | Discharge status: Home / Self Care | Payer: Medicare Other | Attending: Emergency Medicine | Admitting: Emergency Medicine

## 2018-10-02 DIAGNOSIS — Z5321 Procedure and treatment not carried out due to patient leaving prior to being seen by health care provider: Secondary | ICD-10-CM | POA: Insufficient documentation

## 2018-10-02 DIAGNOSIS — R109 Unspecified abdominal pain: Secondary | ICD-10-CM | POA: Insufficient documentation

## 2018-10-02 LAB — CBC
HCT: 29.8 % — ABNORMAL LOW (ref 39.0–52.0)
Hemoglobin: 9.5 g/dL — ABNORMAL LOW (ref 13.0–17.0)
MCH: 29.3 pg (ref 26.0–34.0)
MCHC: 31.9 g/dL (ref 30.0–36.0)
MCV: 92 fL (ref 80.0–100.0)
Platelets: 129 10*3/uL — ABNORMAL LOW (ref 150–400)
RBC: 3.24 MIL/uL — ABNORMAL LOW (ref 4.22–5.81)
RDW: 17.5 % — ABNORMAL HIGH (ref 11.5–15.5)
WBC: 3.4 10*3/uL — ABNORMAL LOW (ref 4.0–10.5)
nRBC: 0 % (ref 0.0–0.2)

## 2018-10-02 LAB — COMPREHENSIVE METABOLIC PANEL
ALT: 31 U/L (ref 0–44)
AST: 71 U/L — ABNORMAL HIGH (ref 15–41)
Albumin: 4.5 g/dL (ref 3.5–5.0)
Alkaline Phosphatase: 87 U/L (ref 38–126)
Anion gap: 18 — ABNORMAL HIGH (ref 5–15)
BUN: 11 mg/dL (ref 6–20)
CO2: 18 mmol/L — ABNORMAL LOW (ref 22–32)
Calcium: 8.4 mg/dL — ABNORMAL LOW (ref 8.9–10.3)
Chloride: 104 mmol/L (ref 98–111)
Creatinine, Ser: 0.83 mg/dL (ref 0.61–1.24)
GFR calc Af Amer: >60 mL/min (ref >60)
GFR calc non Af Amer: >60 mL/min (ref >60)
Glucose, Bld: 94 mg/dL (ref 70–99)
Potassium: 4.4 mmol/L (ref 3.5–5.1)
Sodium: 140 mmol/L (ref 135–145)
Total Bilirubin: 1.1 mg/dL (ref 0.3–1.2)
Total Protein: 8 g/dL (ref 6.5–8.1)

## 2018-10-02 LAB — ETHANOL: Alcohol, Ethyl (B): 354 mg/dL (ref <10)

## 2018-10-02 LAB — LIPASE, BLOOD: Lipase: 44 U/L (ref 11–51)

## 2018-10-02 NOTE — ED Notes (Signed)
Patient alert, sitting in wheelchair in subwait.

## 2018-10-02 NOTE — ED Triage Notes (Signed)
Pt arrived via EMS with reports of epigastric pain pt reports drinking alcohol, 2 drinks today. Pt appears to be intoxicated.  Pt reports daily drinking.  Pt states he has not been eating and vomited yesterday. Pt also states that his eyes have been red for the past 2 days.

## 2018-10-02 NOTE — ED Notes (Signed)
Patient not in [place]. Not in waiting room. Not in front.

## 2018-10-05 ENCOUNTER — Inpatient Hospital Stay: Payer: Medicare Other

## 2018-10-07 ENCOUNTER — Inpatient Hospital Stay: Payer: Medicare Other

## 2018-10-07 ENCOUNTER — Inpatient Hospital Stay: Payer: Medicare Other | Admitting: Hematology and Oncology

## 2018-10-12 ENCOUNTER — Observation Stay
Admission: EM | Admit: 2018-10-12 | Discharge: 2018-10-13 | Disposition: A | Discharge status: Home / Self Care | Payer: Medicare Other | Source: Home / Self Care | Attending: Emergency Medicine | Admitting: Emergency Medicine

## 2018-10-12 ENCOUNTER — Encounter: Payer: Self-pay | Admitting: Emergency Medicine

## 2018-10-12 ENCOUNTER — Other Ambulatory Visit: Payer: Self-pay

## 2018-10-12 DIAGNOSIS — E860 Dehydration: Secondary | ICD-10-CM | POA: Insufficient documentation

## 2018-10-12 DIAGNOSIS — N179 Acute kidney failure, unspecified: Secondary | ICD-10-CM | POA: Diagnosis present

## 2018-10-12 DIAGNOSIS — E786 Lipoprotein deficiency: Secondary | ICD-10-CM | POA: Insufficient documentation

## 2018-10-12 DIAGNOSIS — E878 Other disorders of electrolyte and fluid balance, not elsewhere classified: Secondary | ICD-10-CM | POA: Insufficient documentation

## 2018-10-12 DIAGNOSIS — K859 Acute pancreatitis without necrosis or infection, unspecified: Secondary | ICD-10-CM | POA: Insufficient documentation

## 2018-10-12 DIAGNOSIS — N189 Chronic kidney disease, unspecified: Secondary | ICD-10-CM | POA: Insufficient documentation

## 2018-10-12 DIAGNOSIS — E1122 Type 2 diabetes mellitus with diabetic chronic kidney disease: Secondary | ICD-10-CM | POA: Insufficient documentation

## 2018-10-12 DIAGNOSIS — K921 Melena: Secondary | ICD-10-CM | POA: Diagnosis not present

## 2018-10-12 DIAGNOSIS — E7800 Pure hypercholesterolemia, unspecified: Secondary | ICD-10-CM | POA: Insufficient documentation

## 2018-10-12 DIAGNOSIS — E876 Hypokalemia: Secondary | ICD-10-CM | POA: Insufficient documentation

## 2018-10-12 DIAGNOSIS — D649 Anemia, unspecified: Secondary | ICD-10-CM | POA: Diagnosis not present

## 2018-10-12 DIAGNOSIS — E785 Hyperlipidemia, unspecified: Secondary | ICD-10-CM | POA: Insufficient documentation

## 2018-10-12 DIAGNOSIS — I129 Hypertensive chronic kidney disease with stage 1 through stage 4 chronic kidney disease, or unspecified chronic kidney disease: Secondary | ICD-10-CM | POA: Insufficient documentation

## 2018-10-12 DIAGNOSIS — Z23 Encounter for immunization: Secondary | ICD-10-CM | POA: Insufficient documentation

## 2018-10-12 DIAGNOSIS — F1020 Alcohol dependence, uncomplicated: Secondary | ICD-10-CM | POA: Insufficient documentation

## 2018-10-12 DIAGNOSIS — Z20828 Contact with and (suspected) exposure to other viral communicable diseases: Secondary | ICD-10-CM | POA: Insufficient documentation

## 2018-10-12 HISTORY — DX: Acute kidney failure, unspecified: N17.9

## 2018-10-12 LAB — URINALYSIS, COMPLETE (UACMP) WITH MICROSCOPIC
Glucose, UA: NEGATIVE mg/dL
Hgb urine dipstick: NEGATIVE
Ketones, ur: NEGATIVE mg/dL
Leukocytes,Ua: NEGATIVE
Nitrite: NEGATIVE
Protein, ur: 30 mg/dL — AB
Specific Gravity, Urine: 1.02 (ref 1.005–1.030)
pH: 5 (ref 5.0–8.0)

## 2018-10-12 LAB — CBC
HCT: 26.2 % — ABNORMAL LOW (ref 39.0–52.0)
Hemoglobin: 8.5 g/dL — ABNORMAL LOW (ref 13.0–17.0)
MCH: 28.5 pg (ref 26.0–34.0)
MCHC: 32.4 g/dL (ref 30.0–36.0)
MCV: 87.9 fL (ref 80.0–100.0)
Platelets: 143 10*3/uL — ABNORMAL LOW (ref 150–400)
RBC: 2.98 MIL/uL — ABNORMAL LOW (ref 4.22–5.81)
RDW: 17.5 % — ABNORMAL HIGH (ref 11.5–15.5)
WBC: 5.1 10*3/uL (ref 4.0–10.5)
nRBC: 1.2 % — ABNORMAL HIGH (ref 0.0–0.2)

## 2018-10-12 LAB — COMPREHENSIVE METABOLIC PANEL
ALT: 32 U/L (ref 0–44)
AST: 65 U/L — ABNORMAL HIGH (ref 15–41)
Albumin: 4.2 g/dL (ref 3.5–5.0)
Alkaline Phosphatase: 96 U/L (ref 38–126)
Anion gap: 15 (ref 5–15)
BUN: 24 mg/dL — ABNORMAL HIGH (ref 6–20)
CO2: 24 mmol/L (ref 22–32)
Calcium: 8.2 mg/dL — ABNORMAL LOW (ref 8.9–10.3)
Chloride: 93 mmol/L — ABNORMAL LOW (ref 98–111)
Creatinine, Ser: 2.61 mg/dL — ABNORMAL HIGH (ref 0.61–1.24)
GFR calc Af Amer: 30 mL/min — ABNORMAL LOW (ref >60)
GFR calc non Af Amer: 26 mL/min — ABNORMAL LOW (ref >60)
Glucose, Bld: 152 mg/dL — ABNORMAL HIGH (ref 70–99)
Potassium: 2.7 mmol/L — CL (ref 3.5–5.1)
Sodium: 132 mmol/L — ABNORMAL LOW (ref 135–145)
Total Bilirubin: 1.2 mg/dL (ref 0.3–1.2)
Total Protein: 7.9 g/dL (ref 6.5–8.1)

## 2018-10-12 LAB — GLUCOSE, CAPILLARY: Glucose-Capillary: 97 mg/dL (ref 70–99)

## 2018-10-12 LAB — URINE DRUG SCREEN, QUALITATIVE (ARMC ONLY)
Amphetamines, Ur Screen: NOT DETECTED
Barbiturates, Ur Screen: NOT DETECTED
Benzodiazepine, Ur Scrn: NOT DETECTED
Cannabinoid 50 Ng, Ur ~~LOC~~: NOT DETECTED
Cocaine Metabolite,Ur ~~LOC~~: NOT DETECTED
MDMA (Ecstasy)Ur Screen: NOT DETECTED
Methadone Scn, Ur: NOT DETECTED
Opiate, Ur Screen: NOT DETECTED
Phencyclidine (PCP) Ur S: NOT DETECTED
Tricyclic, Ur Screen: POSITIVE — AB

## 2018-10-12 LAB — LIPASE, BLOOD: Lipase: 71 U/L — ABNORMAL HIGH (ref 11–51)

## 2018-10-12 LAB — ETHANOL: Alcohol, Ethyl (B): <10 mg/dL (ref <10)

## 2018-10-12 LAB — MAGNESIUM: Magnesium: 0.9 mg/dL — CL (ref 1.7–2.4)

## 2018-10-12 LAB — SARS CORONAVIRUS 2 (TAT 6-24 HRS): SARS Coronavirus 2: NEGATIVE

## 2018-10-12 LAB — POTASSIUM: Potassium: 3.3 mmol/L — ABNORMAL LOW (ref 3.5–5.1)

## 2018-10-12 MED ORDER — VITAMIN B-1 100 MG PO TABS
100.0000 mg | ORAL_TABLET | Freq: Every day | ORAL | Status: DC
  Administered 2018-10-12 – 2018-10-13 (×2): 100 mg via ORAL
  Filled 2018-10-12 (×2): qty 1

## 2018-10-12 MED ORDER — POTASSIUM CHLORIDE CRYS ER 20 MEQ PO TBCR
20.0000 meq | EXTENDED_RELEASE_TABLET | Freq: Once | ORAL | Status: AC
  Administered 2018-10-12: 20 meq via ORAL
  Filled 2018-10-12: qty 1

## 2018-10-12 MED ORDER — INSULIN ASPART 100 UNIT/ML ~~LOC~~ SOLN: 0.0000 [IU] | Freq: Every day | SUBCUTANEOUS | Status: DC

## 2018-10-12 MED ORDER — LORAZEPAM 2 MG/ML IJ SOLN: 1.0000 mg | INTRAMUSCULAR | Status: DC | PRN

## 2018-10-12 MED ORDER — FOLIC ACID 1 MG PO TABS
1.0000 mg | ORAL_TABLET | Freq: Every day | ORAL | Status: DC
  Administered 2018-10-12 – 2018-10-13 (×2): 1 mg via ORAL
  Filled 2018-10-12 (×2): qty 1

## 2018-10-12 MED ORDER — ONDANSETRON HCL 4 MG PO TABS: 4.0000 mg | ORAL_TABLET | Freq: Four times a day (QID) | ORAL | Status: DC | PRN

## 2018-10-12 MED ORDER — OYSTER CALCIUM 500 MG PO TABS
1000.0000 mg | ORAL_TABLET | Freq: Once | ORAL | Status: AC
  Administered 2018-10-12: 1000 mg via ORAL
  Filled 2018-10-12: qty 2

## 2018-10-12 MED ORDER — LORAZEPAM 1 MG PO TABS
1.0000 mg | ORAL_TABLET | ORAL | Status: DC | PRN
  Filled 2018-10-12: qty 1

## 2018-10-12 MED ORDER — ACETAMINOPHEN 650 MG RE SUPP: 650.0000 mg | Freq: Four times a day (QID) | RECTAL | Status: DC | PRN

## 2018-10-12 MED ORDER — THIAMINE HCL 100 MG/ML IJ SOLN
100.0000 mg | Freq: Every day | INTRAMUSCULAR | Status: DC
  Filled 2018-10-12: qty 2

## 2018-10-12 MED ORDER — ADULT MULTIVITAMIN W/MINERALS CH
1.0000 | ORAL_TABLET | Freq: Every day | ORAL | Status: DC
  Administered 2018-10-12 – 2018-10-13 (×2): 1 via ORAL
  Filled 2018-10-12 (×2): qty 1

## 2018-10-12 MED ORDER — ACETAMINOPHEN 325 MG PO TABS: 650.0000 mg | ORAL_TABLET | Freq: Four times a day (QID) | ORAL | Status: DC | PRN

## 2018-10-12 MED ORDER — POTASSIUM CHLORIDE IN NACL 40-0.9 MEQ/L-% IV SOLN
INTRAVENOUS | Status: DC
  Administered 2018-10-12 (×2): 125 mL/h via INTRAVENOUS
  Filled 2018-10-12 (×6): qty 1000

## 2018-10-12 MED ORDER — POTASSIUM CHLORIDE CRYS ER 20 MEQ PO TBCR
60.0000 meq | EXTENDED_RELEASE_TABLET | Freq: Once | ORAL | Status: AC
  Administered 2018-10-12: 13:00:00 60 meq via ORAL
  Filled 2018-10-12: qty 3

## 2018-10-12 MED ORDER — LORAZEPAM 2 MG/ML IJ SOLN
0.0000 mg | Freq: Four times a day (QID) | INTRAMUSCULAR | Status: DC
  Administered 2018-10-12: 14:00:00 2 mg via INTRAVENOUS
  Filled 2018-10-12: qty 1

## 2018-10-12 MED ORDER — MAGNESIUM SULFATE 4 GM/100ML IV SOLN
4.0000 g | INTRAVENOUS | Status: AC
  Administered 2018-10-12 (×2): 4 g via INTRAVENOUS
  Filled 2018-10-12 (×2): qty 100

## 2018-10-12 MED ORDER — INSULIN ASPART 100 UNIT/ML ~~LOC~~ SOLN: 0.0000 [IU] | Freq: Three times a day (TID) | SUBCUTANEOUS | Status: DC

## 2018-10-12 MED ORDER — INFLUENZA VAC SPLIT QUAD 0.5 ML IM SUSY
0.5000 mL | PREFILLED_SYRINGE | INTRAMUSCULAR | Status: AC
  Administered 2018-10-13: 0.5 mL via INTRAMUSCULAR
  Filled 2018-10-12: qty 0.5

## 2018-10-12 MED ORDER — LORAZEPAM 2 MG/ML IJ SOLN: 0.0000 mg | Freq: Two times a day (BID) | INTRAMUSCULAR | Status: DC

## 2018-10-12 MED ORDER — MAGNESIUM SULFATE 4 GM/100ML IV SOLN
4.0000 g | INTRAVENOUS | Status: DC
  Filled 2018-10-12 (×2): qty 100

## 2018-10-12 MED ORDER — LOPERAMIDE HCL 2 MG PO CAPS
4.0000 mg | ORAL_CAPSULE | Freq: Once | ORAL | Status: AC
  Administered 2018-10-12: 4 mg via ORAL
  Filled 2018-10-12: qty 2

## 2018-10-12 MED ORDER — ONDANSETRON HCL 4 MG/2ML IJ SOLN: 4.0000 mg | Freq: Four times a day (QID) | INTRAMUSCULAR | Status: DC | PRN

## 2018-10-12 MED ORDER — SODIUM CHLORIDE 0.9% FLUSH
3.0000 mL | Freq: Once | INTRAVENOUS | Status: AC
  Administered 2018-10-12: 13:00:00 3 mL via INTRAVENOUS

## 2018-10-12 MED ORDER — ONDANSETRON HCL 4 MG/2ML IJ SOLN
4.0000 mg | Freq: Once | INTRAMUSCULAR | Status: AC
  Administered 2018-10-12: 13:00:00 4 mg via INTRAVENOUS
  Filled 2018-10-12: qty 2

## 2018-10-12 MED ORDER — KCL IN DEXTROSE-NACL 20-5-0.45 MEQ/L-%-% IV SOLN: INTRAVENOUS | Status: DC

## 2018-10-12 NOTE — Consult Note (Addendum)
[place] for Electrolyte Monitoring and Replacement   Recent Labs: Potassium (mmol/L)  Date Value  10/12/2018 2.7 (LL)  09/19/2013 3.4 (L)   Magnesium (mg/dL)  Date Value  10/12/2018 0.9 (LL)   Calcium (mg/dL)  Date Value  10/12/2018 8.2 (L)   Calcium, Total (mg/dL)  Date Value  09/19/2013 8.1 (L)   Albumin (g/dL)  Date Value  10/12/2018 4.2  09/19/2013 4.1   Sodium (mmol/L)  Date Value  10/12/2018 132 (L)  09/19/2013 141  Corrected Ca: 8.36   Assessment: Pharmacy was consulted for potassium replacement. Patient has a PMH significant for alcohol dependence. Patient had medication orders today for KCL PO 60 mEq x1 dose (@1240 ) and NS + 40 mEq rate of 125 mL/hr (started @ 6415).   K 2.7 > Corrected Ca 8.36 Mg 0.9 >>   Scr 2.61 >>  Goal of Therapy:  Electrolytes WNL  Plan:  1. Will follow Potassium level @ 1800.    2. Will order magnesium IV 4g x 2 doses and will recheck magnesium 6 hours after last dose.   Rowland Lathe ,PharmD Clinical Pharmacist 10/12/2018 4:56 PM

## 2018-10-12 NOTE — ED Triage Notes (Signed)
Pt here with c/o vomiting and diarrhea for the past few days, states he feels dehydrated. NAD. Walked without issues to triage.

## 2018-10-12 NOTE — Progress Notes (Signed)
MD notified: Lab indicated the patient has a critical magnesium level of 0.9

## 2018-10-12 NOTE — ED Provider Notes (Signed)
[facility] Emergency Department Provider Note       Time seen: ----------------------------------------- 12:23 PM on 10/12/2018 -----------------------------------------   I have reviewed the triage vital signs and the nursing notes.  HISTORY   Chief Complaint Emesis and Diarrhea    HPI George Gomez is a 58 y.o. male with a history of anemia, diabetes, diverticulitis, alcohol dependence, hyperlipidemia, hypertension who presents to the ED for vomiting and diarrhea for the past several days.  Patient feels dehydrated.  He denies any specific pain.  Past Medical History:  Diagnosis Date  . Anemia   . Diabetes mellitus without complication ([place])   . Diverticulitis    Pt states diverticulitis  . EtOH dependence ([place])   . Hypercholesteremia   . Hypertension     Patient Active Problem List   Diagnosis Date Noted  . Weight loss 09/15/2018  . Renal insufficiency 09/15/2018  . Folate deficiency 07/25/2018  . B12 deficiency 12/11/2017  . Normocytic anemia 12/03/2017  . Thrombocytopenia ([place]) 12/03/2017  . Leukopenia 12/03/2017  . GI bleed 05/30/2017  . Acute renal failure (ARF) ([place]) 08/28/2016  . Acute on chronic renal failure ([place]) 03/20/2016  . Dehydration 03/20/2016  . Lactic acidosis 03/20/2016  . Hyponatremia 03/20/2016  . Alcohol abuse 03/20/2016  . Clostridium difficile diarrhea 03/18/2016    Past Surgical History:  Procedure Laterality Date  . ESOPHAGOGASTRODUODENOSCOPY N/A 07/02/2018   Procedure: ESOPHAGOGASTRODUODENOSCOPY (EGD);  Surgeon: Lin Landsman, MD;  Location: [facility] ENDOSCOPY;  Service: Gastroenterology;  Laterality: N/A;  . ESOPHAGOGASTRODUODENOSCOPY (EGD) WITH PROPOFOL N/A 06/01/2017   Procedure: ESOPHAGOGASTRODUODENOSCOPY (EGD) WITH PROPOFOL;  Surgeon: Jonathon Bellows, MD;  Location: [facility] ENDOSCOPY;  Service: Gastroenterology;  Laterality: N/A;  . none      Allergies Patient has no known allergies.  Social  History Social History   Tobacco Use  . Smoking status: Never Smoker  . Smokeless tobacco: Never Used  Substance Use Topics  . Alcohol use: Yes    Alcohol/week: 6.0 standard drinks    Types: 6 Shots of liquor per week    Comment: 0.5 pint a day of liquor  . Drug use: No   Review of Systems Constitutional: Negative for fever. Cardiovascular: Negative for chest pain. Respiratory: Negative for shortness of breath. Gastrointestinal: Positive for vomiting and diarrhea Musculoskeletal: Negative for back pain. Skin: Negative for rash. Neurological: Positive for weakness  All systems negative/normal/unremarkable except as stated in the HPI  ____________________________________________   PHYSICAL EXAM:  VITAL SIGNS: ED Triage Vitals  Enc Vitals Group     BP 10/12/18 1146 126/62     Pulse Rate 10/12/18 1146 78     Resp 10/12/18 1146 16     Temp 10/12/18 1146 97.7 F (36.5 C)     Temp Source 10/12/18 1146 Oral     SpO2 10/12/18 1146 100 %     Weight 10/12/18 1147 209 lb (94.8 kg)     Height 10/12/18 1147 5\' 11"  (1.803 m)     Head Circumference --      Peak Flow --      Pain Score --      Pain Loc --      Pain Edu? --      Excl. in [place]? --    Constitutional: Alert and oriented. Well appearing and in no distress. Eyes: Conjunctivae are normal. Normal extraocular movements. ENT      Head: Normocephalic and atraumatic.      Nose: No congestion/rhinnorhea.      Mouth/Throat:  Mucous membranes are moist.      Neck: No stridor. Cardiovascular: Normal rate, regular rhythm. No murmurs, rubs, or gallops. Respiratory: Normal respiratory effort without tachypnea nor retractions. Breath sounds are clear and equal bilaterally. No wheezes/rales/rhonchi. Gastrointestinal: Soft and nontender. Normal bowel sounds Musculoskeletal: Nontender with normal range of motion in extremities. No lower extremity tenderness nor edema. Neurologic:  Normal speech and language. No gross focal neurologic  deficits are appreciated.  Skin:  Skin is warm, dry and intact.  Psychiatric: Mood and affect are normal. Speech and behavior are normal.  ____________________________________________  ED COURSE:  As part of my medical decision making, I reviewed the following data within the electronic MEDICAL RECORD NUMBER History obtained from family if available, nursing notes, old chart and ekg, as well as notes from prior ED visits. Patient presented for vomiting and diarrhea, we will assess with labs and imaging as indicated at this time.   Procedures  George Gomez was evaluated in Emergency Department on 10/12/2018 for the symptoms described in the history of present illness. He was evaluated in the context of the global COVID-19 pandemic, which necessitated consideration that the patient might be at risk for infection with the SARS-CoV-2 virus that causes COVID-19. Institutional protocols and algorithms that pertain to the evaluation of patients at risk for COVID-19 are in a state of rapid change based on information released by regulatory bodies including the CDC and federal and state organizations. These policies and algorithms were followed during the patient's care in the ED.  ____________________________________________   LABS (pertinent positives/negatives)  Labs Reviewed  LIPASE, BLOOD - Abnormal; Notable for the following components:      Result Value   Lipase 71 (*)    All other components within normal limits  COMPREHENSIVE METABOLIC PANEL - Abnormal; Notable for the following components:   Sodium 132 (*)    Potassium 2.7 (*)    Chloride 93 (*)    Glucose, Bld 152 (*)    BUN 24 (*)    Creatinine, Ser 2.61 (*)    Calcium 8.2 (*)    AST 65 (*)    GFR calc non Af Amer 26 (*)    GFR calc Af Amer 30 (*)    All other components within normal limits  CBC - Abnormal; Notable for the following components:   RBC 2.98 (*)    Hemoglobin 8.5 (*)    HCT 26.2 (*)    RDW 17.5 (*)    Platelets  143 (*)    nRBC 1.2 (*)    All other components within normal limits  SARS CORONAVIRUS 2 (TAT 6-24 HRS)  URINALYSIS, COMPLETE (UACMP) WITH MICROSCOPIC  ____________________________________________   DIFFERENTIAL DIAGNOSIS   Dehydration, electrolyte abnormality, renal failure, alcohol abuse, alcohol withdrawal  FINAL ASSESSMENT AND PLAN  Hypokalemia, acute kidney injury, mild pancreatitis   Plan: The patient had presented for vomiting and diarrhea. Patient's labs did indicate multiple abnormalities including mild hyponatremia and hypochloremia, mild hypokalemia, acute kidney injury and a mildly elevated lipase.  Patient reports cutting back recently on his alcohol intake.  He was given oral potassium and I will start him on normal saline with potassium as well.  Given the extent of his acute kidney injury, he will be benefited from hospital observation.   Ulice Dash, MD    Note: This note was generated in part or whole with voice recognition software. Voice recognition is usually quite accurate but there are transcription errors that can and very often  do occur. I apologize for any typographical errors that were not detected and corrected.     Emily FilbertWilliams,  E, MD 10/12/18 [phone]

## 2018-10-12 NOTE — Progress Notes (Signed)
[facility]  [address] [place], [place] [address] Phone: [phone]  Fax: [phone]   Clinic Day:  10/14/2018  Referring physician: Tula Nakayama*  Chief Complaint: George Gomez is a 58 y.o. male with a normocytic anemia and B12 deficiency who is seen for 1 month assessment.  HPI: The patient was last seen in the hematology clinic on 09/09/2018. At that time, he felt "all right".  He was not eating much; weight was down 16 pounds.  He described melena 1 week prior. He received a B12 injection.  Labs included hematocrit 25.1, hemoglobin 8.3, MCV 90.3. Ferritin was 551 with an iron saturation of 27% and a TIBC of 233. SPEP revealed no M-spike.  Kappa free light chains 46.2, lambda free light chains 41.3 and ration 1.12 (normal). Retic was 1.9%. LDH was 174 (normal). Haptoglobin was 353 (normal).   Patient was admitted to [facility] from 10/12/2018 - 10/13/2018 with nausea, vomiting and diarrhea.  He had been drinking alcohol.  Creatinine was 2.61 (baseline 0.83).  He was dehydrated.  He received IV fluids and electrolyte replacement.  Creatinine was 1.77 on 10/13/2018.  Hemoglobin was 8.5.  During the interim, the patient has felt "all right". He notes drinking 1 alcoholic beverage before ER visit on 10/12/2018. He has not drank any alcohol since being released from the hospital.  He had melena x 2 nights ago, and diarrhea last night. He notes feeling bloated.  He feels dizzy when he bends over.  He is eating an iron rich diet. He reporting drinking water, orange juice, and cranberry juice. He notes a normal energy level.    Past Medical History:  Diagnosis Date  . Anemia   . Diabetes mellitus without complication (HCC)   . Diverticulitis    Pt states diverticulitis  . EtOH dependence (HCC)   . Hypercholesteremia   . Hypertension     Past Surgical History:  Procedure Laterality Date  . ESOPHAGOGASTRODUODENOSCOPY N/A 07/02/2018   Procedure: ESOPHAGOGASTRODUODENOSCOPY (EGD);  Surgeon: Toney Reil, MD;  Location[facility] ENDOSCOPY;  Service: Gastroenterology;  Laterality: N/A;  . ESOPHAGOGASTRODUODENOSCOPY (EGD) WITH PROPOFOL N/A 06/01/2017   Procedure: ESOPHAGOGASTRODUODENOSCOPY (EGD) WITH PROPOFOL;  Surgeon: Wyline Mood, MD;  [facility];  Service: Gastroenterology;  Laterality: N/A;  . none      Family History  Problem Relation Age of Onset  . Rheum arthritis Mother   . Diabetes Father   . Heart disease Father   . Cancer Father   . Cancer Maternal Grandfather   . Cancer Paternal Grandfather     Social History:  reports that he has never smoked. He has never used smokeless tobacco. He reports current alcohol use of about 6.0 standard drinks of alcohol per week. He reports that he does not use drugs. He drinks liquor 2-3 x/day. He is drinking less than he did in the past (moonshine), about 2 shots daily. He did "crack" 10-15 years ago. He lives in [place]. The patient is alone today.  Allergies: No Known Allergies  Current Medications: Current Outpatient Medications  Medication Sig Dispense Refill  . allopurinol (ZYLOPRIM) 300 MG tablet Take 300 mg by mouth daily.     Marland Kitchen amLODipine (NORVASC) 5 MG tablet Take 5 mg by mouth daily.     Marland Kitchen atorvastatin (LIPITOR) 40 MG tablet Take 40 mg by mouth daily.     Marland Kitchen gabapentin (NEURONTIN) 300 MG capsule Take 300 mg by mouth 3 (three) times daily.   3  .  losartan (COZAAR) 50 MG tablet Take 1 tablet (50 mg total) by mouth daily. 30 tablet 1  . Multiple Vitamin (MULTIVITAMIN WITH MINERALS) TABS tablet Take 1 tablet by mouth daily. 30 tablet 0  . naltrexone (DEPADE) 50 MG tablet Take 50 mg by mouth daily.     . pantoprazole (PROTONIX) 40 MG tablet Take 1 tablet (40 mg total) by mouth 2 (two) times daily before a meal. (Patient taking differently: Take 40 mg by mouth daily. ) 60 tablet 1  . propranolol ER (INDERAL LA) 60 MG 24 hr capsule Take 60 mg by mouth  daily.      No current facility-administered medications for this visit.     Review of Systems  Constitutional: Negative for chills, fever, malaise/fatigue and weight loss (stable).       Feels "all right".  HENT: Negative.  Negative for congestion, ear pain, hearing loss, nosebleeds, sinus pain and sore throat.   Eyes: Negative.  Negative for blurred vision and double vision.  Respiratory: Negative.  Negative for cough, hemoptysis and shortness of breath.   Cardiovascular: Negative.  Negative for chest pain, palpitations and orthopnea.  Gastrointestinal: Positive for diarrhea and melena. Negative for abdominal pain, blood in stool, constipation, heartburn, nausea and vomiting.       Felt "bloated". Eating well.  Genitourinary: Negative for dysuria, frequency and urgency.       H/o kidney stones.  Musculoskeletal: Negative.  Negative for back pain, joint pain and myalgias.  Skin: Negative.  Negative for itching and rash.  Neurological: Positive for dizziness (when bending over). Negative for tingling, sensory change, focal weakness, weakness and headaches.  Endo/Heme/Allergies: Does not bruise/bleed easily.       Diabetes.  Psychiatric/Behavioral: Negative.  Negative for depression and memory loss. The patient is not nervous/anxious and does not have insomnia.   All other systems reviewed and are negative.  Performance status (ECOG): 1  Vitals Blood pressure 109/72, pulse 73, temperature (!) 97.3 F (36.3 C), temperature source Tympanic, resp. rate 18, height 5\' 11"  (1.803 m), weight 207 lb 5.5 oz (94 kg), SpO2 100 %.   Physical Exam  Constitutional: He is oriented to person, place, and time. He appears well-developed and well-nourished. No distress.  HENT:  Head: Normocephalic and atraumatic.  Mouth/Throat: Oropharynx is clear and moist. No oropharyngeal exudate.  Wearing a cap.  Lu Duffel.  Eyes: Pupils are equal, round, and reactive to light. Conjunctivae and EOM are normal.  No scleral icterus.  Brown eyes.   Neck: Normal range of motion. Neck supple. No JVD present.  Cardiovascular: Normal rate, regular rhythm and normal heart sounds.  No murmur heard. Pulmonary/Chest: Effort normal and breath sounds normal. No respiratory distress. He has no wheezes. He has no rales. He exhibits no tenderness.  Abdominal: Soft. Bowel sounds are normal. He exhibits no distension and no mass. There is abdominal tenderness in the epigastric area. There is no rebound and no guarding.  Musculoskeletal: Normal range of motion.        General: No tenderness or edema.  Lymphadenopathy:    He has no cervical adenopathy.    He has no axillary adenopathy.       Right: No supraclavicular adenopathy present.       Left: No supraclavicular adenopathy present.  Neurological: He is alert and oriented to person, place, and time.  Skin: Skin is warm and dry. No rash noted. No erythema. No pallor.  Psychiatric: He has a normal mood and affect. His  behavior is normal. Judgment and thought content normal.  Nursing note and vitals reviewed.   Admission on 10/12/2018, Discharged on 10/13/2018  Component Date Value Ref Range Status  . Lipase 10/12/2018 71* 11 - 51 U/L Final   Performed at [facility], [address] [place] [place], [place] [address]  . Sodium 10/12/2018 132* 135 - 145 mmol/L Final  . Potassium 10/12/2018 2.7* 3.5 - 5.1 mmol/L Final   Comment: CRITICAL RESULT CALLED TO, READ BACK BY AND VERIFIED WITH ANNETTE PEREZ@1225  ON 10/12/18 BY HKP   . Chloride 10/12/2018 93* 98 - 111 mmol/L Final  . CO2 10/12/2018 24  22 - 32 mmol/L Final  . Glucose, Bld 10/12/2018 152* 70 - 99 mg/dL Final  . BUN 89/21/1943 24* 6 - 20 mg/dL Final  . Creatinine, Ser 10/12/2018 2.61* 0.61 - 1.24 mg/dL Final  . Calcium 74/08/1446 8.2* 8.9 - 10.3 mg/dL Final  . Total Protein 10/12/2018 7.9  6.5 - 8.1 g/dL Final  . Albumin 18/56/3149 4.2  3.5 - 5.0 g/dL Final  . AST 70/26/3785 65* 15 - 41 U/L Final   . ALT 10/12/2018 32  0 - 44 U/L Final  . Alkaline Phosphatase 10/12/2018 96  38 - 126 U/L Final  . Total Bilirubin 10/12/2018 1.2  0.3 - 1.2 mg/dL Final  . GFR calc non Af Amer 10/12/2018 26* >60 mL/min Final  . GFR calc Af Amer 10/12/2018 30* >60 mL/min Final  . Anion gap 10/12/2018 15  5 - 15 Final   Performed at [facility], [address] [place], [place] [address]  . WBC 10/12/2018 5.1  4.0 - 10.5 K/uL Final  . RBC 10/12/2018 2.98* 4.22 - 5.81 MIL/uL Final  . Hemoglobin 10/12/2018 8.5* 13.0 - 17.0 g/dL Final  . HCT 77/41/2878 26.2* 39.0 - 52.0 % Final  . MCV 10/12/2018 87.9  80.0 - 100.0 fL Final  . MCH 10/12/2018 28.5  26.0 - 34.0 pg Final  . MCHC 10/12/2018 32.4  30.0 - 36.0 g/dL Final  . RDW 67/67/2094 17.5* 11.5 - 15.5 % Final  . Platelets 10/12/2018 143* 150 - 400 K/uL Final  . nRBC 10/12/2018 1.2* 0.0 - 0.2 % Final   Performed at [facility], [address], [place], [place] [address]  . Color, Urine 10/12/2018 AMBER* YELLOW Final   BIOCHEMICALS MAY BE AFFECTED BY COLOR  . APPearance 10/12/2018 CLOUDY* CLEAR Final  . Specific Gravity, Urine 10/12/2018 1.020  1.005 - 1.030 Final  . pH 10/12/2018 5.0  5.0 - 8.0 Final  . Glucose, UA 10/12/2018 NEGATIVE  NEGATIVE mg/dL Final  . Hgb urine dipstick 10/12/2018 NEGATIVE  NEGATIVE Final  . Bilirubin Urine 10/12/2018 SMALL* NEGATIVE Final  . Ketones, ur 10/12/2018 NEGATIVE  NEGATIVE mg/dL Final  . Protein, ur 83/66/2947 30* NEGATIVE mg/dL Final  . Nitrite 65/46/5035 NEGATIVE  NEGATIVE Final  . Glori Luis 10/12/2018 NEGATIVE  NEGATIVE Final  . RBC / HPF 10/12/2018 0-5  0 - 5 RBC/hpf Final  . WBC, UA 10/12/2018 6-10  0 - 5 WBC/hpf Final  . Bacteria, UA 10/12/2018 RARE* NONE SEEN Final  . Squamous Epithelial / LPF 10/12/2018 0-5  0 - 5 Final  . Mucus 10/12/2018 PRESENT   Final  . Hyaline Casts, UA 10/12/2018 PRESENT   Final   Performed at [facility], [address], [place], [place] [address]   . SARS Coronavirus 2 10/12/2018 NEGATIVE  NEGATIVE Final   Comment: (NOTE) SARS-CoV-2 target nucleic acids are NOT DETECTED.  The SARS-CoV-2 RNA is generally detectable in upper and lower respiratory specimens during the acute phase of infection. Negative results do not preclude SARS-CoV-2 infection, do not rule out co-infections with other pathogens, and should not be used as the sole basis for treatment or other patient management decisions. Negative results must be combined with clinical observations, patient history, and epidemiological information. The expected result is Negative. Fact Sheet for Patients: HairSlick.nohttps://www.fda.gov/media/138098/download Fact Sheet for Healthcare Providers: quierodirigir.comhttps://www.fda.gov/media/138095/download This test is not yet approved or cleared by the [place] FDA and  has been authorized for detection and/or diagnosis of SARS-CoV-2 by FDA under an Emergency Use Authorization (EUA). This EUA will remain  in effect (meaning this test can be used) for the duration of the COVID-19 declaration under Section 56                          4(b)(1) of the Act, 21 U.S.C. section 360bbb-3(b)(1), unless the authorization is terminated or revoked sooner. Performed at [facility], [address] [place], [place] [address]   . Tricyclic, Ur Screen 10/12/2018 POSITIVE* NONE DETECTED Final  . Amphetamines, Ur Screen 10/12/2018 NONE DETECTED  NONE DETECTED Final  . MDMA (Ecstasy)Ur Screen 10/12/2018 NONE DETECTED  NONE DETECTED Final  . Cocaine Metabolite,Ur [place] 10/12/2018 NONE DETECTED  NONE DETECTED Final  . Opiate, Ur Screen 10/12/2018 NONE DETECTED  NONE DETECTED Final  . Phencyclidine (PCP) Ur S 10/12/2018 NONE DETECTED  NONE DETECTED Final  . Cannabinoid 50 Ng, Ur [place] 10/12/2018 NONE DETECTED  NONE DETECTED Final  . Barbiturates, Ur Screen 10/12/2018 NONE DETECTED  NONE DETECTED Final  . Benzodiazepine, Ur Scrn 10/12/2018 NONE DETECTED  NONE DETECTED Final  .  Methadone Scn, Ur 10/12/2018 NONE DETECTED  NONE DETECTED Final   Comment: (NOTE) Tricyclics + metabolites, urine    Cutoff 1000 ng/mL Amphetamines + metabolites, urine  Cutoff 1000 ng/mL MDMA (Ecstasy), urine              Cutoff 500 ng/mL Cocaine Metabolite, urine          Cutoff 300 ng/mL Opiate + metabolites, urine        Cutoff 300 ng/mL Phencyclidine (PCP), urine         Cutoff 25 ng/mL Cannabinoid, urine                 Cutoff 50 ng/mL Barbiturates + metabolites, urine  Cutoff 200 ng/mL Benzodiazepine, urine              Cutoff 200 ng/mL Methadone, urine                   Cutoff 300 ng/mL The urine drug screen provides only a preliminary, unconfirmed analytical test result and should not be used for non-medical purposes. Clinical consideration and professional judgment should be applied to any positive drug screen result due to possible interfering substances. A more specific alternate chemical method must be used in order to obtain a confirmed analytical result. Gas chromatography / mass spectrometry (GC/MS) is the preferred confirmat                          ory method. Performed at [facility], [address] [place], [place] [address]   . Alcohol, Ethyl (B) 10/12/2018 <10  <10 mg/dL Final   Comment: (NOTE) Lowest detectable limit for serum alcohol is 10 mg/dL. For medical purposes only. Performed at  [facility], [address] [place], [place] [address]   . Magnesium 10/12/2018 0.9* 1.7 - 2.4 mg/dL Final   Comment: CRITICAL RESULT CALLED TO, READ BACK BY AND VERIFIED WITH JORGE MORALES 10/12/18 @ 1630  MLK Performed at [facility], [address] [place], [place] [address]   . Potassium 10/12/2018 3.3* 3.5 - 5.1 mmol/L Final   Performed at [facility], [address] [place], [place] [address]  . Glucose-Capillary 10/12/2018 97  70 - 99 mg/dL Final  . Sodium 96/29/528409/16/2020 135  135 - 145 mmol/L Final  . Potassium 10/13/2018  3.7  3.5 - 5.1 mmol/L Final  . Chloride 10/13/2018 103  98 - 111 mmol/L Final  . CO2 10/13/2018 23  22 - 32 mmol/L Final  . Glucose, Bld 10/13/2018 129* 70 - 99 mg/dL Final  . BUN 13/24/401009/16/2020 20  6 - 20 mg/dL Final  . Creatinine, Ser 10/13/2018 1.77* 0.61 - 1.24 mg/dL Final  . Calcium 27/25/366409/16/2020 8.0* 8.9 - 10.3 mg/dL Final  . GFR calc non Af Amer 10/13/2018 41* >60 mL/min Final  . GFR calc Af Amer 10/13/2018 47* >60 mL/min Final  . Anion gap 10/13/2018 9  5 - 15 Final   Performed at [facility], [address] [place], [place] [address]  . Phosphorus 10/13/2018 2.0* 2.5 - 4.6 mg/dL Final   Performed at [facility], [address] [place], [place] [address]  . Magnesium 10/13/2018 3.2* 1.7 - 2.4 mg/dL Final   Performed at [facility], [address] [place], [place] [address]  . Glucose-Capillary 10/13/2018 116* 70 - 99 mg/dL Final  . Glucose-Capillary 10/13/2018 115* 70 - 99 mg/dL Final  . Glucose-Capillary 10/13/2018 106* 70 - 99 mg/dL Final    Assessment:  George Gomez is a 58 y.o. male with mild pancytopenia. CBCs from 07/31/2013 - 09/03/2017 reveal a hemoglobinfrom 10.1 - 14.7 without trend. MCV has been normal. Plateletshave ranged between 111,000 - 190,000. WBChas ranged between 2000 - 8300. WBC has been consistently low since 06/01/2017. He drinks alcoholdaily.  CBC on 08/08/2019revealed a hematocrit of 35.4, hemoglobin 11.5, MCV 94.5, platelets 149,000, WBC 2000 with an ANC of 1000. Differential included 49% segs, 34% lymphocytes, 14% monocytes, 2% eosinophils, and 1% basophils. Outside labsnote negative/normal peripheral smear, LDH, and SPEP/UPEP.  Work-up on 11/07/2019revealed a hematocrit of 29.6, hemoglobin 9.3, MCV 97.7, WBC 2600 with an ANC of 1200. Differential included 48% segs, 36% lymphs, 14% monocytes, 1% eosinophils, and 1% basophils. . B12was 332 (low normal). Normal studiesincluded: Creatinine (1.05), LFTs,  ferritin (257), iron studies, folate (7.4), TSH, copper, zinc, ANA, hepatitis B core antibody, hepatitis B surface antigen, hepatitis C antibody, and HIV testing. Retic was 2.2%. Peripheral smearrevealed no abnormalities. Flow cytometry on 01/23/2019 was negative.  He has B12 deficiency and folate deficiency. He began B12 injections on 12/11/2017 (last 08/10/2018). Folate was 4.1 on 07/13/2018.  Abdomen and pelvic CTon 05/30/2017 revealed hepatic steatosis. Spleen was normal.  He has a history of a GI bleedon 05/30/2017. EGDon 06/01/2017 revealed a normal esophagus, normal stomach, and duodenitis. Biopsy revealed reactive duodenitis without dysplasia or malignancy.  [facility].EGDon 07/02/2018 revealedalcoholic gastritis but no evidence of acute bleeding. Stools were heme positive. Herequireda PRBCtransfusion. Hemoglobinrangedbetween 8.4 - 8.7. He was discharged with PPI twice daily.  [facility] 10[facility]12[facility]2018 - 10/13/2018 with nausea, vomiting and diarrhea.  He had been drinking alcohol.  Creatinine was 2.61 (baseline 0.83).  He was dehydrated.  He received IV fluids and electrolyte replacement.  Creatinine was 1.77 on 10/13/2018.  Hemoglobin was 8.5.  Symptomatically, he is dizzy when bending over.  He has had melena.  Hemoglobin is 7.5.  Creatinine is 1.06.  Plan: 1.   Labs today: CBC with diff, BMP, ferritin, iron studies, sed rate, hold tube. 2.   Normocytic anemia             Hematocrit 23.3.  Hemoglobin7.5 MCV 89.3 today.             Hemoglobin was 9.5 on 10/02/2018 and 8.5 on 10/12/2018.  Discuss concern for symptomatic anemia and drop in hemoglobin.             He has a history of a GI bleed secondary to alcoholic gastritis.             He has had interval melena.             He has a history of drinking corn liquor 2x/day (none since hospitalization). 3.  B12 deficiency              B12 was 593 on 07/01/2018. B12 today.  Check folate periodically. 4. Upper GI bleed Patient has a history of GI bleed in 05/2017 and 06/2018.             Patient notes interval melena.   Contact Dr Allegra Lai. 5.Mild leukopenia, resolved             WBC 4300 with an ANC of 2000.  Etiology felt secondary to alcohol.             Continue to monitor. 6.   Renal insufficiency             Creatinine 2.61 on 10/12/2018 with dehydration.             Creatinine 1.06 today.  Creatinine has fluctuated between 0.98 - 1.78 in the past year.  Continue to monitor. 7.   Weight loss             Patient's baseline weight 216-217 pounds.             Etiology felt secondary to alcohol replacing calories.  Continue to monitor. 8.   Referral to the ER.  Report called.  I discussed the assessment and treatment plan with the patient.  The patient was provided an opportunity to ask questions and all were answered.  The patient agreed with the plan and demonstrated an understanding of the instructions.  The patient was advised to call back if the symptoms worsen or if the condition fails to improve as anticipated.   Rosey Bath, MD, PhD    10/14/2018, 10:36 AM  I, Theador Hawthorne, am acting as scribe for [employer]. Merlene Pulling, MD, PhD.  I, Melissa C. Merlene Pulling, MD, have reviewed the above documentation for accuracy and completeness, and I agree with the above.

## 2018-10-12 NOTE — H&P (Signed)
[facility] [place] at [facility]   PATIENT NAME: George Gomez    MR#:  [id number]  DATE OF BIRTH:  09/16/1960  DATE OF ADMISSION:  10/12/2018  PRIMARY CARE PHYSICIAN: [facility]   REQUESTING/REFERRING PHYSICIAN: Dr. Daryel NovemberJonathan Williams  CHIEF COMPLAINT:   Emesis and diarrhea HISTORY OF PRESENT ILLNESS:  George Gomez  is a 58 y.o. male with a known history of anemia, alcohol dependence, hyperlipidemia, hypertension and diabetes comes to the emergency room with nausea and vomiting along with some diarrhea. His vomiting was yesterday last episode. Patient states he drank alcohol yesterday. He does not quantify his alcohol intake accurately.  Found to be dehydrated with acute renal failure creatinine of 2.61.  Potassium is 2.7. Patient is being admitted for IV fluid and hydration.  PAST MEDICAL HISTORY:   Past Medical History:  Diagnosis Date  . Anemia   . Diabetes mellitus without complication (HCC)   . Diverticulitis    Pt states diverticulitis  . EtOH dependence (HCC)   . Hypercholesteremia   . Hypertension     PAST SURGICAL HISTOIRY:   Past Surgical History:  Procedure Laterality Date  . ESOPHAGOGASTRODUODENOSCOPY N/A 07/02/2018   Procedure: ESOPHAGOGASTRODUODENOSCOPY (EGD);  Surgeon: Toney ReilVanga, Rohini Reddy, MD;  [facility] ENDOSCOPY;  Service: Gastroenterology;  Laterality: N/A;  . ESOPHAGOGASTRODUODENOSCOPY (EGD) WITH PROPOFOL N/A 06/01/2017   Procedure: ESOPHAGOGASTRODUODENOSCOPY (EGD) WITH PROPOFOL;  Surgeon: Wyline MoodAnna, Kiran, MD;  [facility] END[facility]COPY;  Service: Gastroenterology;  Laterality: N/A;  . none      SOCIAL HISTORY:   Social History   Tobacco Use  . Smoking status: Never Smoker  . Smokeless tobacco: Never Used  Substance Use Topics  . Alcohol use: Yes    Alcohol/week: 6.0 standard drinks    Types: 6 Shots of liquor per week    Comment: 0.5 pint a day of liquor    FAMILY HISTORY:   Family History   Problem Relation Age of Onset  . Rheum arthritis Mother   . Diabetes Father   . Heart disease Father   . Cancer Father   . Cancer Maternal Grandfather   . Cancer Paternal Grandfather     DRUG ALLERGIES:  No Known Allergies  REVIEW OF SYSTEMS:  Review of Systems  Constitutional: Negative for chills, fever and weight loss.  HENT: Negative for ear discharge, ear pain and nosebleeds.   Eyes: Negative for blurred vision, pain and discharge.  Respiratory: Negative for sputum production, shortness of breath, wheezing and stridor.   Cardiovascular: Negative for chest pain, palpitations, orthopnea and PND.  Gastrointestinal: Positive for diarrhea, nausea and vomiting. Negative for abdominal pain.  Genitourinary: Negative for frequency and urgency.  Musculoskeletal: Negative for back pain and joint pain.  Neurological: Negative for sensory change, speech change, focal weakness and weakness.  Psychiatric/Behavioral: Negative for depression and hallucinations. The patient is not nervous/anxious.      MEDICATIONS AT HOME:   Prior to Admission medications   Medication Sig Start Date End Date Taking? Authorizing Provider  allopurinol (ZYLOPRIM) 300 MG tablet Take 300 mg by mouth daily.    Yes [provider]  amLODipine (NORVASC) 5 MG tablet Take 5 mg by mouth daily.  06/29/18  Yes [provider]  atorvastatin (LIPITOR) 40 MG tablet Take 40 mg by mouth daily.    Yes [provider]  gabapentin (NEURONTIN) 300 MG capsule Take 300 mg by mouth 3 (three) times daily.    Yes [provider]  losartan (  COZAAR) 100 MG tablet Take 100 mg by mouth daily.  06/29/18  Yes [provider]  metFORMIN (GLUCOPHAGE-XR) 500 MG 24 hr tablet Take 1,000 mg by mouth daily.    Yes [provider]  naltrexone (DEPADE) 50 MG tablet Take 50 mg by mouth daily.  06/15/18  Yes [provider]  pantoprazole (PROTONIX) 40 MG tablet Take 1 tablet (40 mg total) by  mouth 2 (two) times daily before a meal. Patient taking differently: Take 40 mg by mouth daily.  07/02/18 10/12/18 Yes Sainani, Belia Heman, MD  propranolol ER (INDERAL LA) 60 MG 24 hr capsule Take 60 mg by mouth daily.  06/29/18  Yes [provider]  lisinopril (PRINIVIL,ZESTRIL) 10 MG tablet Take 10 mg by mouth daily.     [provider]      VITAL SIGNS:  Blood pressure 105/68, pulse 74, temperature 98.6 F (37 C), temperature source Oral, resp. rate 18, height 5\' 11"  (1.803 m), weight 94.8 kg, SpO2 100 %.  PHYSICAL EXAMINATION:  GENERAL:  58 y.o.-year-old patient lying in the bed with no acute distress. Obese EYES: Pupils equal, round, reactive to light and accommodation. No scleral icterus. Extraocular muscles intact.  HEENT: Head atraumatic, normocephalic. Oropharynx and nasopharynx clear. Mucosa dry NECK:  Supple, no jugular venous distention. No thyroid enlargement, no tenderness.  LUNGS: Normal breath sounds bilaterally, no wheezing, rales,rhonchi or crepitation. No use of accessory muscles of respiration.  CARDIOVASCULAR: S1, S2 normal. No murmurs, rubs, or gallops.  ABDOMEN: Soft, nontender, nondistended. Bowel sounds present. No organomegaly or mass.  EXTREMITIES: No pedal edema, cyanosis, or clubbing.  NEUROLOGIC: Cranial nerves II through XII are intact. Muscle strength 5/5 in all extremities. Sensation intact. Gait not checked.  PSYCHIATRIC: The patient is alert and oriented x 3.  SKIN: No obvious rash, lesion, or ulcer.   LABORATORY PANEL:   CBC Recent Labs  Lab 10/12/18 1154  WBC 5.1  HGB 8.5*  HCT 26.2*  PLT 143*   ------------------------------------------------------------------------------------------------------------------  Chemistries  Recent Labs  Lab 10/12/18 1154  NA 132*  K 2.7*  CL 93*  CO2 24  GLUCOSE 152*  BUN 24*  CREATININE 2.61*  CALCIUM 8.2*  AST 65*  ALT 32  ALKPHOS 96  BILITOT 1.2    ------------------------------------------------------------------------------------------------------------------  Cardiac Enzymes No results for input(s): TROPONINI in the last 168 hours. ------------------------------------------------------------------------------------------------------------------  RADIOLOGY:  No results found.  EKG:    IMPRESSION AND PLAN:   George Gomez  is a 58 y.o. male with a known history of anemia, alcohol dependence, hyperlipidemia, hypertension and diabetes comes to the emergency room with nausea and vomiting along with some diarrhea. His vomiting was yesterday last episode. Patient states he drank alcohol yesterday. He does not quantify his alcohol intake accurately.  1. Acute renal failure secondary to G.I. losses. -Baseline  creatinine 0.6 -creatinine today 2.61 -IV fluids with KCl -pharmacy consult for electrolyte replacement -monitor input output avoid nephrotoxic agents  2. Hyperkalemia secondary to vomiting -oral and IV replacement  3. Chronic anemia -no active bleeding. Hemoglobin stable at 8.5  4. Chronic thrombocytopenia likely due to alcohol dependence  5. Alcohol abuse/dependence-- alcohol induced hepatitis -CIWA protocol -PRN Ativan -patient advised on abstinence  6. DVT prophylaxis SCD   All the records are reviewed and case discussed with ED provider.   CODE STATUS: full  TOTAL TIME TAKING CARE OF THIS PATIENT: *61minutes.    Fritzi Mandes M.D on 10/12/2018 at 3:03 PM  Between 7am to 6pm -  Pager - [phone]  After 6pm go to [email or web address] - password [email or web address] [facility]  [facility]  Office  [phone]  CC: Primary care physician; Inc, [employer]

## 2018-10-12 NOTE — ED Notes (Signed)
Report given to receiving nurse

## 2018-10-12 NOTE — ED Notes (Signed)
Critical received from lab k 2.7, md aware. Patient reports er visit today due to diarrhea x 3 fays and fecreased appetitive x 21 week.

## 2018-10-12 NOTE — ED Notes (Signed)
Patient reports drinks daily, presents with tremors. Positive hand and tongue tremors, patient attribute his tremors to " pre Parkinson disease", cwall score 6 vss will continue to monitor.

## 2018-10-12 NOTE — ED Notes (Signed)
Admitting Md at bedside

## 2018-10-12 NOTE — Consult Note (Signed)
[place] for Potassium Monitoring and Replacement   Recent Labs: Potassium (mmol/L)  Date Value  10/12/2018 2.7 (LL)  09/19/2013 3.4 (L)   Calcium (mg/dL)  Date Value  10/12/2018 8.2 (L)   Calcium, Total (mg/dL)  Date Value  09/19/2013 8.1 (L)   Albumin (g/dL)  Date Value  10/12/2018 4.2  09/19/2013 4.1   Sodium (mmol/L)  Date Value  10/12/2018 132 (L)  09/19/2013 141    Assessment: Pharmacy was consulted for potassium replacement. Patient has a PMH significant for alcohol dependence. Patient had medication orders today for KCL PO 60 mEq x1 dose (@1240 ) and NS + 40 mEq rate of 125 mL/hr (started @ 1638).   Patient is in AKI. Baseline Scr 0.83 on 10/02/2018.   Goal of Therapy:  Potassium WNL.   Plan:  No additional replacement needed at this time. Will recheck potassium @ 1800 and with AM labs.   Rowland Lathe ,PharmD Clinical Pharmacist 10/12/2018 3:11 PM

## 2018-10-12 NOTE — ED Notes (Signed)
First attempt to call report. As per charge nurse will call back when patient has been assigned to a nurse.

## 2018-10-12 NOTE — ED Notes (Signed)
Second attempt to call report.

## 2018-10-12 NOTE — Consult Note (Signed)
[place] for Electrolyte Monitoring and Replacement   Recent Labs: Potassium (mmol/L)  Date Value  10/12/2018 3.3 (L)  09/19/2013 3.4 (L)   Magnesium (mg/dL)  Date Value  10/12/2018 0.9 (LL)   Calcium (mg/dL)  Date Value  10/12/2018 8.2 (L)   Calcium, Total (mg/dL)  Date Value  09/19/2013 8.1 (L)   Albumin (g/dL)  Date Value  10/12/2018 4.2  09/19/2013 4.1   Sodium (mmol/L)  Date Value  10/12/2018 132 (L)  09/19/2013 141  Corrected Ca: 8.36   Assessment: Pharmacy was consulted for potassium replacement. Patient has a PMH significant for alcohol dependence. Patient had medication orders today for KCL PO 60 mEq x1 dose (@1240 ) and NS + 40 mEq rate of 125 mL/hr (started @ 5883).   K 2.7 > Corrected Ca 8.36 Mg 0.9 >>   Scr 2.61 >>  Goal of Therapy:  Electrolytes WNL  Plan:  1. Will order  KCl PO 20 mEqx1 dose and recheck potassium with AM labs.   2. Will order 1000 mg elemental calcium x1 dose and follow with AM labs.  3. Will recheck magnesium ~ 6 hours after last dose.   Rowland Lathe ,PharmD Clinical Pharmacist 10/12/2018 7:17 PM

## 2018-10-12 NOTE — ED Notes (Signed)
ED TO INPATIENT HANDOFF REPORT  ED Nurse Name and Phone #:    S Name/Age/Gender George Gomez 58 y.o. male Room/Bed: ED25A/ED25A  Code Status   Code Status: Prior  Home/SNF/Other Home Patient oriented to: self, place, time and situation Is this baseline? Yes   Triage Complete: Triage complete  Chief Complaint Dehydrated  Triage Note Pt here with c/o vomiting and diarrhea for the past few days, states he feels dehydrated. NAD. Walked without issues to triage.    Allergies No Known Allergies  Level of Care/Admitting Diagnosis ED Disposition    ED Disposition Condition Comment   Admit  Hospital Area: [facility] [[id number]]  Level of Care: Med-Surg [16]  Covid Evaluation: Asymptomatic Screening Protocol (No Symptoms)  Diagnosis: Acute renal failure (HCC) [161096]  Admitting Physician: Joselyn Glassman  Attending Physician: Enedina Finner [[id number]]  PT Class (Do Not Modify): Observation [104]  PT Acc Code (Do Not Modify): Observation [10022]       B Medical/Surgery History Past Medical History:  Diagnosis Date  . Anemia   . Diabetes mellitus without complication (HCC)   . Diverticulitis    Pt states diverticulitis  . EtOH dependence (HCC)   . Hypercholesteremia   . Hypertension    Past Surgical History:  Procedure Laterality Date  . ESOPHAGOGASTRODUODENOSCOPY N/A 07/02/2018   Procedure: ESOPHAGOGASTRODUODENOSCOPY (EGD);  Surgeon: Toney Reil, MD;  Location: [facility] ENDOSCOPY;  Service: Gastroenterology;  Laterality: N/A;  . ESOPHAGOGASTRODUODENOSCOPY (EGD) WITH PROPOFOL N/A 06/01/2017   Procedure: ESOPHAGOGASTRODUODENOSCOPY (EGD) WITH PROPOFOL;  Surgeon: Wyline Mood, MD;  Location: [facility] ENDOSCOPY;  Service: Gastroenterology;  Laterality: N/A;  . none       A IV Location/Drains/Wounds Patient Lines/Drains/Airways Status   Active Line/Drains/Airways    Name:   Placement date:   Placement time:   Site:   Days:   Peripheral IV 10/02/18  Right Antecubital   10/02/18    -    Antecubital   10   Peripheral IV 10/12/18 Left Antecubital   10/12/18    1235    Antecubital   less than 1          Intake/Output Last 24 hours No intake or output data in the 24 hours ending 10/12/18 1427  Labs/Imaging Results for orders placed or performed during the hospital encounter of 10/12/18 (from the past 48 hour(s))  Lipase, blood     Status: Abnormal   Collection Time: 10/12/18 11:54 AM  Result Value Ref Range   Lipase 71 (H) 11 - 51 U/L    Comment: Performed at [facility], [address] [place], [place] [address]  Comprehensive metabolic panel     Status: Abnormal   Collection Time: 10/12/18 11:54 AM  Result Value Ref Range   Sodium 132 (L) 135 - 145 mmol/L   Potassium 2.7 (LL) 3.5 - 5.1 mmol/L    Comment: CRITICAL RESULT CALLED TO, READ BACK BY AND VERIFIED WITH ANNETTE PEREZ@1225  ON 10/12/18 BY HKP    Chloride 93 (L) 98 - 111 mmol/L   CO2 24 22 - 32 mmol/L   Glucose, Bld 152 (H) 70 - 99 mg/dL   BUN 24 (H) 6 - 20 mg/dL   Creatinine, Ser 9.81 (H) 0.61 - 1.24 mg/dL   Calcium 8.2 (L) 8.9 - 10.3 mg/dL   Total Protein 7.9 6.5 - 8.1 g/dL   Albumin 4.2 3.5 - 5.0 g/dL   AST 65 (H) 15 - 41 U/L   ALT 32 0 - 44  U/L   Alkaline Phosphatase 96 38 - 126 U/L   Total Bilirubin 1.2 0.3 - 1.2 mg/dL   GFR calc non Af Amer 26 (L) >60 mL/min   GFR calc Af Amer 30 (L) >60 mL/min   Anion gap 15 5 - 15    Comment: Performed at [facility], [address] [place], [place] [address]  CBC     Status: Abnormal   Collection Time: 10/12/18 11:54 AM  Result Value Ref Range   WBC 5.1 4.0 - 10.5 K/uL   RBC 2.98 (L) 4.22 - 5.81 MIL/uL   Hemoglobin 8.5 (L) 13.0 - 17.0 g/dL   HCT 19.126.2 (L) 47.839.0 - 29.552.0 %   MCV 87.9 80.0 - 100.0 fL   MCH 28.5 26.0 - 34.0 pg   MCHC 32.4 30.0 - 36.0 g/dL   RDW 62.117.5 (H) 30.811.5 - 65.715.5 %   Platelets 143 (L) 150 - 400 K/uL   nRBC 1.2 (H) 0.0 - 0.2 %    Comment: Performed at [facility], [address] [place], [place] [address]  Urinalysis, Complete w Microscopic     Status: Abnormal   Collection Time: 10/12/18 12:24 PM  Result Value Ref Range   Color, Urine AMBER (A) YELLOW    Comment: BIOCHEMICALS MAY BE AFFECTED BY COLOR   APPearance CLOUDY (A) CLEAR   Specific Gravity, Urine 1.020 1.005 - 1.030   pH 5.0 5.0 - 8.0   Glucose, UA NEGATIVE NEGATIVE mg/dL   Hgb urine dipstick NEGATIVE NEGATIVE   Bilirubin Urine SMALL (A) NEGATIVE   Ketones, ur NEGATIVE NEGATIVE mg/dL   Protein, ur 30 (A) NEGATIVE mg/dL   Nitrite NEGATIVE NEGATIVE   Leukocytes,Ua NEGATIVE NEGATIVE   RBC / HPF 0-5 0 - 5 RBC/hpf   WBC, UA 6-10 0 - 5 WBC/hpf   Bacteria, UA RARE (A) NONE SEEN   Squamous Epithelial / LPF 0-5 0 - 5   Mucus PRESENT    Hyaline Casts, UA PRESENT     Comment: Performed at [facility], [address] [place], [place] [address]   No results found.  Pending Labs Unresulted Labs (From admission, onward)    Start     Ordered   10/12/18 1320  Magnesium  Once,   STAT     10/12/18 1320   10/12/18 1314  Ethanol  Add-on,   AD     10/12/18 1313   10/12/18 1314  Urine Drug Screen, Qualitative ([facility] only)  Once,   STAT     10/12/18 1313   10/12/18 1257  SARS CORONAVIRUS 2 (TAT 6-24 HRS) Nasopharyngeal Nasopharyngeal Swab  (Asymptomatic/Tier 2 Patients Labs)  Once,   STAT    Question Answer Comment  Is this test for diagnosis or screening Screening   Symptomatic for COVID-19 as defined by CDC No   Hospitalized for COVID-19 No   Admitted to ICU for COVID-19 No   Previously tested for COVID-19 No   Resident in a congregate (group) care setting No   Employed in healthcare setting No      10/12/18 1257          Vitals/Pain Today's Vitals   10/12/18 1146 10/12/18 1147 10/12/18 1249 10/12/18 1405  BP: 126/62  113/73 110/72  Pulse: 78  70 67  Resp: 16  17   Temp: 97.7 F (36.5 C)  98.6 F (37 C)   TempSrc: Oral  Oral   SpO2: 100%  100%   Weight:  94.8  kg     Height:  5\' 11"  (1.803 m)    PainSc:   5      Isolation Precautions No active isolations  Medications Medications  0.9 % NaCl with KCl 40 mEq / L  infusion (125 mL/hr Intravenous New Bag/Given 10/12/18 1244)  LORazepam (ATIVAN) tablet 1-4 mg (has no administration in time range)    Or  LORazepam (ATIVAN) injection 1-4 mg (has no administration in time range)  thiamine (VITAMIN B-1) tablet 100 mg (100 mg Oral Given 10/12/18 1407)    Or  thiamine (B-1) injection 100 mg ( Intravenous See Alternative 9/35/70 1779)  folic acid (FOLVITE) tablet 1 mg (1 mg Oral Given 10/12/18 1407)  multivitamin with minerals tablet 1 tablet (1 tablet Oral Given 10/12/18 1407)  LORazepam (ATIVAN) injection 0-4 mg (2 mg Intravenous Given 10/12/18 1410)    Followed by  LORazepam (ATIVAN) injection 0-4 mg (has no administration in time range)  sodium chloride flush (NS) 0.9 % injection 3 mL (3 mLs Intravenous Given 10/12/18 1245)  potassium chloride SA (K-DUR) CR tablet 60 mEq (60 mEq Oral Given 10/12/18 1240)  loperamide (IMODIUM) capsule 4 mg (4 mg Oral Given 10/12/18 1239)  ondansetron (ZOFRAN) injection 4 mg (4 mg Intravenous Given 10/12/18 1243)    Mobility walks Low fall risk   Focused Assessments medicine    R Recommendations: See Admitting Provider Note  Report given to:   Additional Notes:

## 2018-10-13 LAB — MAGNESIUM: Magnesium: 3.2 mg/dL — ABNORMAL HIGH (ref 1.7–2.4)

## 2018-10-13 LAB — GLUCOSE, CAPILLARY
Glucose-Capillary: 116 mg/dL — ABNORMAL HIGH (ref 70–99)
Glucose-Capillary: 115 mg/dL — ABNORMAL HIGH (ref 70–99)
Glucose-Capillary: 106 mg/dL — ABNORMAL HIGH (ref 70–99)

## 2018-10-13 LAB — BASIC METABOLIC PANEL
Anion gap: 9 (ref 5–15)
BUN: 20 mg/dL (ref 6–20)
CO2: 23 mmol/L (ref 22–32)
Calcium: 8 mg/dL — ABNORMAL LOW (ref 8.9–10.3)
Chloride: 103 mmol/L (ref 98–111)
Creatinine, Ser: 1.77 mg/dL — ABNORMAL HIGH (ref 0.61–1.24)
GFR calc Af Amer: 47 mL/min — ABNORMAL LOW (ref >60)
GFR calc non Af Amer: 41 mL/min — ABNORMAL LOW (ref >60)
Glucose, Bld: 129 mg/dL — ABNORMAL HIGH (ref 70–99)
Potassium: 3.7 mmol/L (ref 3.5–5.1)
Sodium: 135 mmol/L (ref 135–145)

## 2018-10-13 LAB — PHOSPHORUS: Phosphorus: 2 mg/dL — ABNORMAL LOW (ref 2.5–4.6)

## 2018-10-13 MED ORDER — POTASSIUM PHOSPHATE MONOBASIC 500 MG PO TABS
1000.0000 mg | ORAL_TABLET | ORAL | Status: AC
  Administered 2018-10-13 (×2): 1000 mg via ORAL
  Filled 2018-10-13 (×2): qty 2

## 2018-10-13 MED ORDER — ADULT MULTIVITAMIN W/MINERALS CH: 1.0000 | ORAL_TABLET | Freq: Every day | ORAL | 0 refills | Status: DC

## 2018-10-13 MED ORDER — LOSARTAN POTASSIUM 50 MG PO TABS: 50.0000 mg | ORAL_TABLET | Freq: Every day | ORAL | 1 refills | Status: DC

## 2018-10-13 MED ORDER — CALCIUM CARBONATE 1250 (500 CA) MG PO TABS
1000.0000 mg | ORAL_TABLET | Freq: Two times a day (BID) | ORAL | Status: DC
  Administered 2018-10-13: 1000 mg via ORAL
  Filled 2018-10-13: qty 2

## 2018-10-13 NOTE — Progress Notes (Signed)
The patient has been D/C. Education completed at discharged. Medication scripts provided.

## 2018-10-13 NOTE — Plan of Care (Signed)
  Problem: Education: Goal: Knowledge of General Education information will improve Description: Including pain rating scale, medication(s)/side effects and non-pharmacologic comfort measures 10/13/2018 1655 by Myles Rosenthal, Cory Roughen, RN Outcome: Completed/Met 10/13/2018 1655 by Myles Rosenthal, Cory Roughen, RN Outcome: Not Progressing   Problem: Health Behavior/Discharge Planning: Goal: Ability to manage health-related needs will improve 10/13/2018 1655 by Myles Rosenthal, Cory Roughen, RN Outcome: Completed/Met 10/13/2018 1655 by Myles Rosenthal, Cory Roughen, RN Outcome: Not Progressing   Problem: Clinical Measurements: Goal: Ability to maintain clinical measurements within normal limits will improve 10/13/2018 1655 by Myles Rosenthal, Cory Roughen, RN Outcome: Completed/Met 10/13/2018 1655 by Myles Rosenthal, Cory Roughen, RN Outcome: Not Progressing Goal: Will remain free from infection 10/13/2018 1655 by Myles Rosenthal, Cory Roughen, RN Outcome: Completed/Met 10/13/2018 1655 by Myles Rosenthal, Cory Roughen, RN Outcome: Not Progressing Goal: Diagnostic test results will improve 10/13/2018 1655 by Ronna Polio, RN Outcome: Completed/Met 10/13/2018 1655 by Myles Rosenthal, Cory Roughen, RN Outcome: Not Progressing Goal: Respiratory complications will improve 10/13/2018 1655 by Ronna Polio, RN Outcome: Completed/Met 10/13/2018 1655 by Myles Rosenthal, Cory Roughen, RN Outcome: Not Progressing Goal: Cardiovascular complication will be avoided 10/13/2018 1655 by Ronna Polio, RN Outcome: Completed/Met 10/13/2018 1655 by Ronna Polio, RN Outcome: Not Progressing   Problem: Activity: Goal: Risk for activity intolerance will decrease 10/13/2018 1655 by Myles Rosenthal, Cory Roughen, RN Outcome: Completed/Met 10/13/2018 1655 by Myles Rosenthal, Cory Roughen, RN Outcome: Not Progressing   Problem: Nutrition: Goal: Adequate nutrition will be maintained 10/13/2018 1655 by Myles Rosenthal, Cory Roughen,  RN Outcome: Completed/Met 10/13/2018 1655 by Myles Rosenthal, Cory Roughen, RN Outcome: Not Progressing   Problem: Coping: Goal: Level of anxiety will decrease 10/13/2018 1655 by Myles Rosenthal, Cory Roughen, RN Outcome: Completed/Met 10/13/2018 1655 by Myles Rosenthal, Cory Roughen, RN Outcome: Not Progressing   Problem: Elimination: Goal: Will not experience complications related to bowel motility 10/13/2018 1655 by Ronna Polio, RN Outcome: Completed/Met 10/13/2018 1655 by Myles Rosenthal, Cory Roughen, RN Outcome: Not Progressing Goal: Will not experience complications related to urinary retention 10/13/2018 1655 by Ronna Polio, RN Outcome: Completed/Met 10/13/2018 1655 by Myles Rosenthal, Cory Roughen, RN Outcome: Not Progressing   Problem: Pain Managment: Goal: General experience of comfort will improve 10/13/2018 1655 by Myles Rosenthal, Cory Roughen, RN Outcome: Completed/Met 10/13/2018 1655 by Myles Rosenthal, Cory Roughen, RN Outcome: Not Progressing   Problem: Safety: Goal: Ability to remain free from injury will improve 10/13/2018 1655 by Myles Rosenthal, Cory Roughen, RN Outcome: Completed/Met 10/13/2018 1655 by Myles Rosenthal, Cory Roughen, RN Outcome: Not Progressing   Problem: Skin Integrity: Goal: Risk for impaired skin integrity will decrease 10/13/2018 1655 by Ronna Polio, RN Outcome: Completed/Met 10/13/2018 1655 by Ronna Polio, RN Outcome: Not Progressing

## 2018-10-13 NOTE — Discharge Summary (Signed)
[place] at [place] NAME: George Gomez    MR#:  [id number]  DATE OF BIRTH:  1960-06-15  DATE OF ADMISSION:  10/12/2018 ADMITTING PHYSICIAN: Fritzi Mandes, MD  DATE OF DISCHARGE: 10/13/2018  PRIMARY CARE PHYSICIAN: [facility], [place]    ADMISSION DIAGNOSIS:  Dehydration [E86.0] Hypokalemia [E87.6]  DISCHARGE DIAGNOSIS:   Acute renal failure secondary to G.I. losses from nausea vomiting. Improved Alcoholic hepatitis electrolyte abnormality corrected SECONDARY DIAGNOSIS:   Past Medical History:  Diagnosis Date  . Anemia   . Diabetes mellitus without complication ([place])   . Diverticulitis    Pt states diverticulitis  . EtOH dependence ([place])   . Hypercholesteremia   . Hypertension     HOSPITAL COURSE:   Eliga Arvie  is a 58 y.o. male with a known history of anemia, alcohol dependence, hyperlipidemia, hypertension and diabetes comes to the emergency room with nausea and vomiting along with some diarrhea. His vomiting was yesterday last episode. Patient states he drank alcohol yesterday. He does not quantify his alcohol intake accurately.  1. Acute renal failure secondary to G.I. losses.--improved -Baseline  creatinine 0.6 -creatinine today 2.61--1.7 -recieved IV fluids with KCl, IV mag  -pharmacy consult for electrolyte replacement--now corrected -monitor input/ output, avoid nephrotoxic agents  2. Hyperkalemia secondary to vomiting -oral and IV replacement -K 3.7  3. Chronic anemia -no active bleeding. Hemoglobin stable at 8.5  4. Chronic thrombocytopenia likely due to alcohol dependence  5. Alcohol abuse/dependence-- alcohol induced hepatitis -CIWA protocol--no s/s of WD -PRN Ativan -patient advised on abstinence  6. DVT prophylaxis SCD  7. Hypertension -patient had soft blood pressure due to dehydration and nausea vomiting now improved. -He will continue his blood pressure medication.  Decreased dose of losartan to 50 mg. Discontinued lisinopril.  8. Type II diabetes without complication -patient's blood sugars are less than 110. -Given elevated creatinine and stable sugars I have asked patient to hold his metformin. He will check his sugars on a daily basis discussed with primary care physician.  Patient will follow-up at [facility]. CONSULTS OBTAINED:    DRUG ALLERGIES:  No Known Allergies  DISCHARGE MEDICATIONS:   Allergies as of 10/13/2018   No Known Allergies     Medication List    STOP taking these medications   lisinopril 10 MG tablet Commonly known as: ZESTRIL   metFORMIN 500 MG 24 hr tablet Commonly known as: GLUCOPHAGE-XR     TAKE these medications   allopurinol 300 MG tablet Commonly known as: ZYLOPRIM Take 300 mg by mouth daily.   amLODipine 5 MG tablet Commonly known as: NORVASC Take 5 mg by mouth daily.   atorvastatin 40 MG tablet Commonly known as: LIPITOR Take 40 mg by mouth daily.   gabapentin 300 MG capsule Commonly known as: NEURONTIN Take 300 mg by mouth 3 (three) times daily.   losartan 50 MG tablet Commonly known as: COZAAR Take 1 tablet (50 mg total) by mouth daily. What changed:  medication strength how much to take   multivitamin with minerals Tabs tablet Take 1 tablet by mouth daily. Start taking on: October 14, 2018   naltrexone 50 MG tablet Commonly known as: DEPADE Take 50 mg by mouth daily.   pantoprazole 40 MG tablet Commonly known as: PROTONIX Take 1 tablet (40 mg total) by mouth 2 (two) times daily before a meal. What changed: when to take this   propranolol ER 60 MG 24 hr capsule Commonly known as:  INDERAL LA Take 60 mg by mouth daily.       If you experience worsening of your admission symptoms, develop shortness of breath, life threatening emergency, suicidal or homicidal thoughts you must seek medical attention immediately by calling 911 or calling your MD immediately  if symptoms less  severe.  You Must read complete instructions/literature along with all the possible adverse reactions/side effects for all the Medicines you take and that have been prescribed to you. Take any new Medicines after you have completely understood and accept all the possible adverse reactions/side effects.   Please note  You were cared for by a hospitalist during your hospital stay. If you have any questions about your discharge medications or the care you received while you were in the hospital after you are discharged, you can call the unit and asked to speak with the hospitalist on call if the hospitalist that took care of you is not available. Once you are discharged, your primary care physician will handle any further medical issues. Please note that NO REFILLS for any discharge medications will be authorized once you are discharged, as it is imperative that you return to your primary care physician (or establish a relationship with a primary care physician if you do not have one) for your aftercare needs so that they can reassess your need for medications and monitor your lab values. Today   SUBJECTIVE   Out in the chair. Tolerating soft/regular diet. No vomiting. No signs of withdrawal.  VITAL SIGNS:  Blood pressure 122/66, pulse 72, temperature 98.2 F (36.8 C), temperature source Oral, resp. rate 18, height 5\' 11"  (1.803 m), weight 94.8 kg, SpO2 94 %.  I/O:    Intake/Output Summary (Last 24 hours) at 10/13/2018 1316 Last data filed at 10/13/2018 0900 Gross per 24 hour  Intake 1969.19 ml  Output 450 ml  Net 1519.19 ml    PHYSICAL EXAMINATION:  GENERAL:  58 y.o.-year-old patient lying in the bed with no acute distress.  EYES: Pupils equal, round, reactive to light and accommodation. No scleral icterus. Extraocular muscles intact.  HEENT: Head atraumatic, normocephalic. Oropharynx and nasopharynx clear.  NECK:  Supple, no jugular venous distention. No thyroid enlargement, no  tenderness.  LUNGS: Normal breath sounds bilaterally, no wheezing, rales,rhonchi or crepitation. No use of accessory muscles of respiration.  CARDIOVASCULAR: S1, S2 normal. No murmurs, rubs, or gallops.  ABDOMEN: Soft, non-tender, non-distended. Bowel sounds present. No organomegaly or mass.  EXTREMITIES: No pedal edema, cyanosis, or clubbing.  NEUROLOGIC: Cranial nerves II through XII are intact. Muscle strength 5/5 in all extremities. Sensation intact. Gait not checked.  PSYCHIATRIC: The patient is alert and oriented x 3.  SKIN: No obvious rash, lesion, or ulcer.   DATA REVIEW:   CBC  Recent Labs  Lab 10/12/18 1154  WBC 5.1  HGB 8.5*  HCT 26.2*  PLT 143*    Chemistries  Recent Labs  Lab 10/12/18 1154  10/13/18 0248  NA 132*  --  135  K 2.7*   < > 3.7  CL 93*  --  103  CO2 24  --  23  GLUCOSE 152*  --  129*  BUN 24*  --  20  CREATININE 2.61*  --  1.77*  CALCIUM 8.2*  --  8.0*  MG  --    < > 3.2*  AST 65*  --   --   ALT 32  --   --   ALKPHOS 96  --   --  BILITOT 1.2  --   --    < > = values in this interval not displayed.    Microbiology Results   Recent Results (from the past 240 hour(s))  SARS CORONAVIRUS 2 (TAT 6-24 HRS) Nasopharyngeal Nasopharyngeal Swab     Status: None   Collection Time: 10/12/18  2:14 PM   Specimen: Nasopharyngeal Swab  Result Value Ref Range Status   SARS Coronavirus 2 NEGATIVE NEGATIVE Final    Comment: (NOTE) SARS-CoV-2 target nucleic acids are NOT DETECTED. The SARS-CoV-2 RNA is generally detectable in upper and lower respiratory specimens during the acute phase of infection. Negative results do not preclude SARS-CoV-2 infection, do not rule out co-infections with other pathogens, and should not be used as the sole basis for treatment or other patient management decisions. Negative results must be combined with clinical observations, patient history, and epidemiological information. The expected result is Negative. Fact Sheet  for Patients: HairSlick.nohttps://www.fda.gov/media/138098/download Fact Sheet for Healthcare Providers: quierodirigir.comhttps://www.fda.gov/media/138095/download This test is not yet approved or cleared by the [place] FDA and  has been authorized for detection and/or diagnosis of SARS-CoV-2 by FDA under an Emergency Use Authorization (EUA). This EUA will remain  in effect (meaning this test can be used) for the duration of the COVID-19 declaration under Section 56 4(b)(1) of the Act, 21 U.S.C. section 360bbb-3(b)(1), unless the authorization is terminated or revoked sooner. Performed at [facility], [address] [place], [place] [address]     RADIOLOGY:  No results found.   CODE STATUS:     Code Status Orders  (From admission, onward)         Start     Ordered   10/12/18 1601  Full code  Continuous     10/12/18 1600        Code Status History    Date Active Date Inactive Code Status Order ID Comments User Context   07/01/2018 2230 07/02/2018 2125 Full Code [id number]  Pearletha AlfredSeals, Angela H, NP ED   05/30/2017 2025 06/01/2017 1825 Full Code [id number]  [place] SirenSainani, Vivek J, MD Inpatient   08/28/2016 1309 08/29/2016 1823 Full Code [id number]  [place] SirenSainani, Vivek J, MD Inpatient   03/18/2016 0427 03/20/2016 1353 Full Code [id number]  Ihor AustinPyreddy, Pavan, MD Inpatient   Advance Care Planning Activity      TOTAL TIME TAKING CARE OF THIS PATIENT: *40* minutes.    Enedina FinnerSona Jodye Scali M.D on 10/13/2018 at 1:16 PM  Between 7am to 6pm - Pager - [phone] After 6pm go to [email or web address] - Social research officer, governmentpassword [email or web address] [email or web address]  [facility] [place] [facility]  Office  [phone]  CC: Primary care physician; [facility], [facility]

## 2018-10-13 NOTE — Consult Note (Signed)
[place] for Electrolyte Monitoring and Replacement   Recent Labs: Potassium (mmol/L)  Date Value  10/13/2018 3.7  09/19/2013 3.4 (L)   Magnesium (mg/dL)  Date Value  10/13/2018 3.2 (H)   Calcium (mg/dL)  Date Value  10/13/2018 8.0 (L)   Calcium, Total (mg/dL)  Date Value  09/19/2013 8.1 (L)   Albumin (g/dL)  Date Value  10/12/2018 4.2  09/19/2013 4.1   Phosphorus (mg/dL)  Date Value  10/13/2018 2.0 (L)   Sodium (mmol/L)  Date Value  10/13/2018 135  09/19/2013 141  Corrected Ca: 8.36   Assessment: Pharmacy was consulted for potassium replacement. Patient has a PMH significant for alcohol dependence. Patient had medication orders today for KCL PO 60 mEq x1 dose (@1240 ) and NS + 40 mEq rate of 125 mL/hr (started @ 6195).   9/16: K 3.7 Corrected Ca 8.36 Mg 3.2 Phos 2.0  Scr 2.61 >> 1.77  Goal of Therapy:  Electrolytes WNL  Plan:  Will order KPhos 1g PO q4hr x 2 doses. Will decrease NS+KCl 41mEq from 164ml/hr to 144ml/hr to avoid over-correction of potassium. Order has been placed for 1g elemental calcium x 2 doses.  Will recheck levels with AM labs.  Pearla Dubonnet ,PharmD Clinical Pharmacist 10/13/2018 9:49 AM

## 2018-10-13 NOTE — Consult Note (Signed)
[place] for Electrolyte Monitoring and Replacement   Recent Labs: Potassium (mmol/L)  Date Value  10/13/2018 3.7  09/19/2013 3.4 (L)   Magnesium (mg/dL)  Date Value  10/13/2018 3.2 (H)   Calcium (mg/dL)  Date Value  10/13/2018 8.0 (L)   Calcium, Total (mg/dL)  Date Value  09/19/2013 8.1 (L)   Albumin (g/dL)  Date Value  10/12/2018 4.2  09/19/2013 4.1   Phosphorus (mg/dL)  Date Value  10/13/2018 2.0 (L)   Sodium (mmol/L)  Date Value  10/13/2018 135  09/19/2013 141  Corrected Ca: 8.36   Assessment: Pharmacy was consulted for potassium replacement. Patient has a PMH significant for alcohol dependence. Patient had medication orders today for KCL PO 60 mEq x1 dose (@1240 ) and NS + 40 mEq rate of 125 mL/hr (started @ 9622).   K 2.7 > 3.7 Corrected Ca 8.36 Mg 0.9 >> 3.2  Scr 2.61 >> 1.77  Goal of Therapy:  Electrolytes WNL  Plan:  1. Will recheck potassium and magnesium with AM labs.  Continue to monitor and adjust as appropriate.  Ena Dawley ,PharmD Clinical Pharmacist 10/13/2018 3:49 AM

## 2018-10-13 NOTE — Care Management Obs Status (Signed)
MEDICARE OBSERVATION STATUS NOTIFICATION   Patient Details  Name: George Gomez MRN: [id number] Date of Birth: 1960-11-04   Medicare Observation Status Notification Given:  Yes    Beverly Sessions, RN 10/13/2018, 12:33 PM

## 2018-10-13 NOTE — TOC Transition Note (Signed)
Transition of Care [facility]) - CM/SW Discharge Note   Patient Details  Name: George Gomez MRN: [id number] Date of Birth: 30-May-1960  [facility] of [facility]) CM/SW Contact:  Beverly Sessions, RN Phone Number: 10/13/2018, 2:39 PM   Clinical Narrative:     Patient admitted from home with ARF.  Patient states that he lives at home alone.   PCP - [facility] drug. Denies issues obtaining medications  Patient states that he has reliable transportation  RNCM consulted for current substance abuse.  Patient states "I drink everyday, but it's with friends and stuff, so it's not like that".  Initially patient declined any resources.  Then he states he would like a copy incase he changes his mind.  RNCM provided patient with outpatient and inpatient resources   Final next level of care: Home/Self Care Barriers to Discharge: No Barriers Identified   Patient Goals and CMS Choice        Discharge Placement                       Discharge Plan and Services                                     Social Determinants of Health (SDOH) Interventions     Readmission Risk Interventions No flowsheet data found.

## 2018-10-13 NOTE — Discharge Instructions (Signed)
HOLD YOUR METFORMIN (diabetes) medication for now. Keep the tab on your blood sugars at home. Once your blood sugar is and remains above 130 resume your metformin.

## 2018-10-14 ENCOUNTER — Encounter: Payer: Self-pay | Admitting: Emergency Medicine

## 2018-10-14 ENCOUNTER — Encounter: Payer: Self-pay | Admitting: Hematology and Oncology

## 2018-10-14 ENCOUNTER — Inpatient Hospital Stay
Admission: EM | Admit: 2018-10-14 | Discharge: 2018-10-17 | DRG: 378 | Disposition: A | Discharge status: Home / Self Care | Payer: Medicare Other | Attending: Internal Medicine | Admitting: Internal Medicine

## 2018-10-14 ENCOUNTER — Inpatient Hospital Stay: Payer: Medicare Other | Attending: Hematology and Oncology

## 2018-10-14 ENCOUNTER — Other Ambulatory Visit: Payer: Self-pay

## 2018-10-14 ENCOUNTER — Telehealth: Payer: Self-pay

## 2018-10-14 ENCOUNTER — Inpatient Hospital Stay: Payer: Medicare Other

## 2018-10-14 ENCOUNTER — Inpatient Hospital Stay (HOSPITAL_BASED_OUTPATIENT_CLINIC_OR_DEPARTMENT_OTHER): Payer: Medicare Other | Admitting: Hematology and Oncology

## 2018-10-14 VITALS — BP 109/72 | HR 73 | Temp 97.3°F | Resp 18 | Ht 71.0 in | Wt 207.3 lb

## 2018-10-14 DIAGNOSIS — Z833 Family history of diabetes mellitus: Secondary | ICD-10-CM

## 2018-10-14 DIAGNOSIS — K921 Melena: Secondary | ICD-10-CM | POA: Diagnosis not present

## 2018-10-14 DIAGNOSIS — G4730 Sleep apnea, unspecified: Secondary | ICD-10-CM | POA: Diagnosis present

## 2018-10-14 DIAGNOSIS — R700 Elevated erythrocyte sedimentation rate: Secondary | ICD-10-CM | POA: Diagnosis present

## 2018-10-14 DIAGNOSIS — N289 Disorder of kidney and ureter, unspecified: Secondary | ICD-10-CM

## 2018-10-14 DIAGNOSIS — Z79899 Other long term (current) drug therapy: Secondary | ICD-10-CM

## 2018-10-14 DIAGNOSIS — Z20828 Contact with and (suspected) exposure to other viral communicable diseases: Secondary | ICD-10-CM | POA: Diagnosis present

## 2018-10-14 DIAGNOSIS — N179 Acute kidney failure, unspecified: Secondary | ICD-10-CM | POA: Diagnosis present

## 2018-10-14 DIAGNOSIS — Z7984 Long term (current) use of oral hypoglycemic drugs: Secondary | ICD-10-CM

## 2018-10-14 DIAGNOSIS — E538 Deficiency of other specified B group vitamins: Secondary | ICD-10-CM

## 2018-10-14 DIAGNOSIS — D649 Anemia, unspecified: Secondary | ICD-10-CM

## 2018-10-14 DIAGNOSIS — D696 Thrombocytopenia, unspecified: Secondary | ICD-10-CM | POA: Diagnosis not present

## 2018-10-14 DIAGNOSIS — I10 Essential (primary) hypertension: Secondary | ICD-10-CM | POA: Diagnosis present

## 2018-10-14 DIAGNOSIS — D509 Iron deficiency anemia, unspecified: Secondary | ICD-10-CM | POA: Diagnosis present

## 2018-10-14 DIAGNOSIS — Z23 Encounter for immunization: Secondary | ICD-10-CM

## 2018-10-14 DIAGNOSIS — K5730 Diverticulosis of large intestine without perforation or abscess without bleeding: Secondary | ICD-10-CM | POA: Diagnosis present

## 2018-10-14 DIAGNOSIS — D62 Acute posthemorrhagic anemia: Secondary | ICD-10-CM | POA: Diagnosis present

## 2018-10-14 DIAGNOSIS — R634 Abnormal weight loss: Secondary | ICD-10-CM

## 2018-10-14 DIAGNOSIS — E7800 Pure hypercholesterolemia, unspecified: Secondary | ICD-10-CM | POA: Diagnosis present

## 2018-10-14 DIAGNOSIS — K219 Gastro-esophageal reflux disease without esophagitis: Secondary | ICD-10-CM | POA: Diagnosis present

## 2018-10-14 DIAGNOSIS — Z8261 Family history of arthritis: Secondary | ICD-10-CM

## 2018-10-14 DIAGNOSIS — Z8249 Family history of ischemic heart disease and other diseases of the circulatory system: Secondary | ICD-10-CM

## 2018-10-14 DIAGNOSIS — F1020 Alcohol dependence, uncomplicated: Secondary | ICD-10-CM | POA: Diagnosis present

## 2018-10-14 DIAGNOSIS — D638 Anemia in other chronic diseases classified elsewhere: Secondary | ICD-10-CM | POA: Diagnosis present

## 2018-10-14 DIAGNOSIS — E119 Type 2 diabetes mellitus without complications: Secondary | ICD-10-CM | POA: Diagnosis present

## 2018-10-14 LAB — RETICULOCYTES
Immature Retic Fract: 40 % — ABNORMAL HIGH (ref 2.3–15.9)
RBC.: 2.62 MIL/uL — ABNORMAL LOW (ref 4.22–5.81)
Retic Count, Absolute: 34.6 10*3/uL (ref 19.0–186.0)
Retic Ct Pct: 1.3 % (ref 0.4–3.1)

## 2018-10-14 LAB — CBC WITH DIFFERENTIAL/PLATELET
Abs Immature Granulocytes: 0.23 10*3/uL — ABNORMAL HIGH (ref 0.00–0.07)
Abs Immature Granulocytes: 0.2 10*3/uL — ABNORMAL HIGH (ref 0.00–0.07)
Basophils Absolute: 0 10*3/uL (ref 0.0–0.1)
Basophils Absolute: 0 10*3/uL (ref 0.0–0.1)
Basophils Relative: 1 %
Basophils Relative: 1 %
Eosinophils Absolute: 0.1 10*3/uL (ref 0.0–0.5)
Eosinophils Absolute: 0.1 10*3/uL (ref 0.0–0.5)
Eosinophils Relative: 2 %
Eosinophils Relative: 1 %
HCT: 23.3 % — ABNORMAL LOW (ref 39.0–52.0)
HCT: 22.9 % — ABNORMAL LOW (ref 39.0–52.0)
Hemoglobin: 7.5 g/dL — ABNORMAL LOW (ref 13.0–17.0)
Hemoglobin: 7.4 g/dL — ABNORMAL LOW (ref 13.0–17.0)
Immature Granulocytes: 5 %
Immature Granulocytes: 5 %
Lymphocytes Relative: 18 %
Lymphocytes Relative: 20 %
Lymphs Abs: 0.8 10*3/uL (ref 0.7–4.0)
Lymphs Abs: 0.8 10*3/uL (ref 0.7–4.0)
MCH: 28.7 pg (ref 26.0–34.0)
MCH: 28.8 pg (ref 26.0–34.0)
MCHC: 32.2 g/dL (ref 30.0–36.0)
MCHC: 32.3 g/dL (ref 30.0–36.0)
MCV: 89.3 fL (ref 80.0–100.0)
MCV: 89.1 fL (ref 80.0–100.0)
Monocytes Absolute: 1.2 10*3/uL — ABNORMAL HIGH (ref 0.1–1.0)
Monocytes Absolute: 1.1 10*3/uL — ABNORMAL HIGH (ref 0.1–1.0)
Monocytes Relative: 28 %
Monocytes Relative: 26 %
Neutro Abs: 2 10*3/uL (ref 1.7–7.7)
Neutro Abs: 2 10*3/uL (ref 1.7–7.7)
Neutrophils Relative %: 46 %
Neutrophils Relative %: 47 %
Platelets: 174 10*3/uL (ref 150–400)
Platelets: 181 10*3/uL (ref 150–400)
RBC: 2.61 MIL/uL — ABNORMAL LOW (ref 4.22–5.81)
RBC: 2.57 MIL/uL — ABNORMAL LOW (ref 4.22–5.81)
RDW: 18 % — ABNORMAL HIGH (ref 11.5–15.5)
RDW: 18 % — ABNORMAL HIGH (ref 11.5–15.5)
WBC: 4.3 10*3/uL (ref 4.0–10.5)
WBC: 4.1 10*3/uL (ref 4.0–10.5)
nRBC: 1.6 % — ABNORMAL HIGH (ref 0.0–0.2)
nRBC: 1.5 % — ABNORMAL HIGH (ref 0.0–0.2)

## 2018-10-14 LAB — COMPREHENSIVE METABOLIC PANEL
ALT: 46 U/L — ABNORMAL HIGH (ref 0–44)
AST: 96 U/L — ABNORMAL HIGH (ref 15–41)
Albumin: 4 g/dL (ref 3.5–5.0)
Alkaline Phosphatase: 115 U/L (ref 38–126)
Anion gap: 12 (ref 5–15)
BUN: 10 mg/dL (ref 6–20)
CO2: 22 mmol/L (ref 22–32)
Calcium: 8.5 mg/dL — ABNORMAL LOW (ref 8.9–10.3)
Chloride: 102 mmol/L (ref 98–111)
Creatinine, Ser: 1 mg/dL (ref 0.61–1.24)
GFR calc Af Amer: >60 mL/min (ref >60)
GFR calc non Af Amer: >60 mL/min (ref >60)
Glucose, Bld: 120 mg/dL — ABNORMAL HIGH (ref 70–99)
Potassium: 3.6 mmol/L (ref 3.5–5.1)
Sodium: 136 mmol/L (ref 135–145)
Total Bilirubin: 0.6 mg/dL (ref 0.3–1.2)
Total Protein: 7.6 g/dL (ref 6.5–8.1)

## 2018-10-14 LAB — BASIC METABOLIC PANEL
Anion gap: 7 (ref 5–15)
BUN: 11 mg/dL (ref 6–20)
CO2: 23 mmol/L (ref 22–32)
Calcium: 8.1 mg/dL — ABNORMAL LOW (ref 8.9–10.3)
Chloride: 104 mmol/L (ref 98–111)
Creatinine, Ser: 1.06 mg/dL (ref 0.61–1.24)
GFR calc Af Amer: >60 mL/min (ref >60)
GFR calc non Af Amer: >60 mL/min (ref >60)
Glucose, Bld: 130 mg/dL — ABNORMAL HIGH (ref 70–99)
Potassium: 3.7 mmol/L (ref 3.5–5.1)
Sodium: 134 mmol/L — ABNORMAL LOW (ref 135–145)

## 2018-10-14 LAB — SEDIMENTATION RATE: Sed Rate: >140 mm/hr — ABNORMAL HIGH (ref 0–20)

## 2018-10-14 LAB — GLUCOSE, CAPILLARY
Glucose-Capillary: 101 mg/dL — ABNORMAL HIGH (ref 70–99)
Glucose-Capillary: 120 mg/dL — ABNORMAL HIGH (ref 70–99)

## 2018-10-14 MED ORDER — PROPRANOLOL HCL ER 60 MG PO CP24
60.0000 mg | ORAL_CAPSULE | Freq: Every day | ORAL | Status: DC
  Administered 2018-10-15 – 2018-10-17 (×3): 60 mg via ORAL
  Filled 2018-10-14 (×3): qty 1

## 2018-10-14 MED ORDER — ACETAMINOPHEN 325 MG PO TABS: 650.0000 mg | ORAL_TABLET | Freq: Four times a day (QID) | ORAL | Status: DC | PRN

## 2018-10-14 MED ORDER — CYANOCOBALAMIN 1000 MCG/ML IJ SOLN
1000.0000 ug | Freq: Once | INTRAMUSCULAR | Status: AC
  Administered 2018-10-14: 1000 ug via INTRAMUSCULAR

## 2018-10-14 MED ORDER — ALLOPURINOL 100 MG PO TABS
300.0000 mg | ORAL_TABLET | Freq: Every day | ORAL | Status: DC
  Administered 2018-10-15 – 2018-10-17 (×3): 300 mg via ORAL
  Filled 2018-10-14: qty 3
  Filled 2018-10-14 (×2): qty 1
  Filled 2018-10-14: qty 3

## 2018-10-14 MED ORDER — ONDANSETRON HCL 4 MG/2ML IJ SOLN: 4.0000 mg | Freq: Four times a day (QID) | INTRAMUSCULAR | Status: DC | PRN

## 2018-10-14 MED ORDER — ONDANSETRON HCL 4 MG PO TABS: 4.0000 mg | ORAL_TABLET | Freq: Four times a day (QID) | ORAL | Status: DC | PRN

## 2018-10-14 MED ORDER — ATORVASTATIN CALCIUM 20 MG PO TABS
40.0000 mg | ORAL_TABLET | Freq: Every day | ORAL | Status: DC
  Administered 2018-10-15 – 2018-10-16 (×2): 40 mg via ORAL
  Filled 2018-10-14 (×2): qty 2

## 2018-10-14 MED ORDER — INSULIN ASPART 100 UNIT/ML ~~LOC~~ SOLN: 0.0000 [IU] | Freq: Every day | SUBCUTANEOUS | Status: DC

## 2018-10-14 MED ORDER — SODIUM CHLORIDE 0.9 % IV SOLN
80.0000 mg | Freq: Once | INTRAVENOUS | Status: AC
  Administered 2018-10-14: 80 mg via INTRAVENOUS
  Filled 2018-10-14: qty 80

## 2018-10-14 MED ORDER — SODIUM CHLORIDE 0.9 % IV SOLN
400.0000 mg | Freq: Once | INTRAVENOUS | Status: AC
  Administered 2018-10-14: 17:00:00 400 mg via INTRAVENOUS
  Filled 2018-10-14: qty 20

## 2018-10-14 MED ORDER — ADULT MULTIVITAMIN W/MINERALS CH
1.0000 | ORAL_TABLET | Freq: Every day | ORAL | Status: DC
  Administered 2018-10-15 – 2018-10-17 (×3): 1 via ORAL
  Filled 2018-10-14 (×3): qty 1

## 2018-10-14 MED ORDER — AMLODIPINE BESYLATE 5 MG PO TABS
5.0000 mg | ORAL_TABLET | Freq: Every day | ORAL | Status: DC
  Administered 2018-10-15 – 2018-10-17 (×3): 5 mg via ORAL
  Filled 2018-10-14 (×3): qty 1

## 2018-10-14 MED ORDER — PANTOPRAZOLE SODIUM 40 MG IV SOLR: 40.0000 mg | Freq: Two times a day (BID) | INTRAVENOUS | Status: DC

## 2018-10-14 MED ORDER — ACETAMINOPHEN 650 MG RE SUPP: 650.0000 mg | Freq: Four times a day (QID) | RECTAL | Status: DC | PRN

## 2018-10-14 MED ORDER — INSULIN ASPART 100 UNIT/ML ~~LOC~~ SOLN
0.0000 [IU] | Freq: Three times a day (TID) | SUBCUTANEOUS | Status: DC
  Administered 2018-10-15: 2 [IU] via SUBCUTANEOUS
  Administered 2018-10-16: 1 [IU] via SUBCUTANEOUS
  Filled 2018-10-14 (×2): qty 1

## 2018-10-14 MED ORDER — GABAPENTIN 300 MG PO CAPS
300.0000 mg | ORAL_CAPSULE | Freq: Three times a day (TID) | ORAL | Status: DC
  Administered 2018-10-14 – 2018-10-17 (×9): 300 mg via ORAL
  Filled 2018-10-14 (×9): qty 1

## 2018-10-14 MED ORDER — NALTREXONE HCL 50 MG PO TABS
50.0000 mg | ORAL_TABLET | Freq: Every day | ORAL | Status: DC
  Administered 2018-10-15 – 2018-10-17 (×3): 50 mg via ORAL
  Filled 2018-10-14 (×3): qty 1

## 2018-10-14 MED ORDER — SODIUM CHLORIDE 0.9 % IV SOLN
8.0000 mg/h | INTRAVENOUS | Status: DC
  Administered 2018-10-14 – 2018-10-17 (×6): 8 mg/h via INTRAVENOUS
  Filled 2018-10-14 (×6): qty 80

## 2018-10-14 NOTE — ED Triage Notes (Signed)
Says they called and said his blood tests were abnormal.

## 2018-10-14 NOTE — H&P (Signed)
[place] at [place] NAME: George Gomez    MR#:  [id number]  DATE OF BIRTH:  1960-06-26  DATE OF ADMISSION:  10/14/2018  PRIMARY CARE PHYSICIAN: [facility], [place]   REQUESTING/REFERRING PHYSICIAN: Dr. Merlyn Lot  CHIEF COMPLAINT:   Chief Complaint  Patient presents with  . Abnormal Lab    HISTORY OF PRESENT ILLNESS:  George Gomez  is a 58 y.o. male with a known history of anemia.  Patient was recently in the hospital for acute kidney injury and was given IV fluid hydration.  The patient states he feels okay.  His hemoglobin has been in the 8-9's and upon recent discharge was 8.5.  On follow-up it was down to 7.5 and repeat 7.4.  Dr. Mike Gip spoke with Dr. Vicente Males gastroenterology and set up an endoscopy for tomorrow.  Hospitalist services were contacted for further evaluation.  ER physician spoke with the endoscopy suite that the patient's recent negative COVID test would suffice for endoscopy tomorrow.  PAST MEDICAL HISTORY:   Past Medical History:  Diagnosis Date  . Anemia   . Diabetes mellitus without complication ([place])   . Diverticulitis    Pt states diverticulitis  . EtOH dependence ([place])   . Hypercholesteremia   . Hypertension     PAST SURGICAL HISTORY:   Past Surgical History:  Procedure Laterality Date  . ESOPHAGOGASTRODUODENOSCOPY N/A 07/02/2018   Procedure: ESOPHAGOGASTRODUODENOSCOPY (EGD);  Surgeon: Lin Landsman, MD;  Location: [facility] ENDOSCOPY;  Service: Gastroenterology;  Laterality: N/A;  . ESOPHAGOGASTRODUODENOSCOPY (EGD) WITH PROPOFOL N/A 06/01/2017   Procedure: ESOPHAGOGASTRODUODENOSCOPY (EGD) WITH PROPOFOL;  Surgeon: Jonathon Bellows, MD;  Location[facility]COPY;  Service: Gastroenterology;  Laterality: N/A;  . none      SOCIAL HISTORY:   Social History   Tobacco Use  . Smoking status: Never Smoker  . Smokeless tobacco: Never Used  Substance Use Topics  . Alcohol use: Yes   Alcohol/week: 6.0 standard drinks    Types: 6 Shots of liquor per week    Comment: 0.5 pint a day of liquor    FAMILY HISTORY:   Family History  Problem Relation Age of Onset  . Rheum arthritis Mother   . Diabetes Father   . Heart disease Father   . Cancer Father   . Cancer Maternal Grandfather   . Cancer Paternal Grandfather     DRUG ALLERGIES:  No Known Allergies  REVIEW OF SYSTEMS:  CONSTITUTIONAL: No fever, did have a chill.  No fatigue or weakness.  EYES: No blurred or double vision.  EARS, NOSE, AND THROAT: No tinnitus or ear pain. No sore throat RESPIRATORY: No cough, shortness of breath, wheezing or hemoptysis.  CARDIOVASCULAR: No chest pain, orthopnea, edema.  GASTROINTESTINAL: Last week had nausea, vomiting, diarrhea and abdominal pain. No blood in bowel movements GENITOURINARY: No dysuria, hematuria.  ENDOCRINE: No polyuria, nocturia,  HEMATOLOGY: No anemia, easy bruising or bleeding SKIN: No rash or lesion. MUSCULOSKELETAL: Positive for joint pain NEUROLOGIC: No tingling, numbness, weakness.  PSYCHIATRY: No anxiety or depression.   MEDICATIONS AT HOME:   Prior to Admission medications   Medication Sig Start Date End Date Taking? Authorizing Provider  allopurinol (ZYLOPRIM) 300 MG tablet Take 300 mg by mouth daily.    Yes [provider]  amLODipine (NORVASC) 5 MG tablet Take 5 mg by mouth daily.  06/29/18  Yes [provider]  atorvastatin (LIPITOR) 40 MG tablet Take 40 mg by mouth daily.    Yes  [provider]  gabapentin (NEURONTIN) 300 MG capsule Take 300 mg by mouth 3 (three) times daily.    Yes [provider]  losartan (COZAAR) 50 MG tablet Take 1 tablet (50 mg total) by mouth daily. 10/13/18  Yes Fritzi Mandes, MD  Multiple Vitamin (MULTIVITAMIN WITH MINERALS) TABS tablet Take 1 tablet by mouth daily. 10/14/18  Yes Fritzi Mandes, MD  naltrexone (DEPADE) 50 MG tablet Take 50 mg by mouth daily.  06/15/18  Yes [provider]  pantoprazole (PROTONIX) 40 MG tablet Take 1 tablet (40 mg total) by mouth 2 (two) times daily before a meal. Patient taking differently: Take 40 mg by mouth daily.  07/02/18 10/14/18 Yes Sainani, Belia Heman, MD  propranolol ER (INDERAL LA) 60 MG 24 hr capsule Take 60 mg by mouth daily.  06/29/18  Yes [provider]      VITAL SIGNS:  Blood pressure 123/63, pulse 67, temperature 98.7 F (37.1 C), temperature source Oral, resp. rate 14, height '5\' 11"'$  (1.803 m), weight 94 kg, SpO2 100 %.  PHYSICAL EXAMINATION:  GENERAL:  58 y.o.-year-old patient lying in the bed with no acute distress.  EYES: Pupils equal, round, reactive to light and accommodation. No scleral icterus. Extraocular muscles intact.  HEENT: Head atraumatic, normocephalic. Oropharynx and nasopharynx clear.  NECK:  Supple, no jugular venous distention. No thyroid enlargement, no tenderness.  LUNGS: Normal breath sounds bilaterally, no wheezing, rales,rhonchi or crepitation. No use of accessory muscles of respiration.  CARDIOVASCULAR: S1, S2 normal. No murmurs, rubs, or gallops.  ABDOMEN: Soft, nontender, nondistended. Bowel sounds present. No organomegaly or mass.  EXTREMITIES: No pedal edema, cyanosis, or clubbing.  NEUROLOGIC: Cranial nerves II through XII are intact. Muscle strength 5/5 in all extremities. Sensation intact. Gait not checked.  PSYCHIATRIC: The patient is alert and oriented x 3.  SKIN: No rash, lesion, or ulcer.   LABORATORY PANEL:   CBC Recent Labs  Lab 10/14/18 1421  WBC 4.1  HGB 7.4*  HCT 22.9*  PLT 181   ------------------------------------------------------------------------------------------------------------------  Chemistries  Recent Labs  Lab 10/13/18 0248  10/14/18 1421  NA 135   < > 136  K 3.7   < > 3.6  CL 103   < > 102  CO2 23   < > 22  GLUCOSE 129*   < > 120*  BUN 20   < > 10  CREATININE 1.77*   < > 1.00  CALCIUM 8.0*   < > 8.5*  MG 3.2*  --   --   AST  --    --  96*  ALT  --   --  46*  ALKPHOS  --   --  115  BILITOT  --   --  0.6   < > = values in this interval not displayed.   ------------------------------------------------------------------------------------------------------------------   IMPRESSION AND PLAN:   1.  Acute on chronic blood loss anemia with melena a few days back.  Dr. Vicente Males was contacted for endoscopy tomorrow.  N.p.o. after midnight.  Patient ordered Protonix drip by ER physician.  I will give IV Venofer.  Patient does not want a transfusion at this point.  Will check hemoglobin again tomorrow morning. 2.  Alcohol dependence with elevations of liver function test no signs of withdrawal at this time 3.  Hypertension.  Hold off on losartan.  Continue Norvasc and propranolol. 4.  Type 2 diabetes mellitus.  Sliding scale for right now.  Continue to hold metformin 5.  Recent  admission for acute kidney injury secondary to GI symptoms.  Creatinine has improved. 6.  Elevation of ESR.  Check rheumatoid factor and ANA. Recent serum protein electrophoresis looked okay.  With normal white blood cell count and no fever now or recent hospitalization doubt infection as the cause.   All the records are reviewed and case discussed with ED provider. Management plans discussed with the patient, family and they are in agreement.  CODE STATUS: Full code  TOTAL TIME TAKING CARE OF THIS PATIENT: 50 minutes.    Loletha Grayer M.D on 10/14/2018 at 2:51 PM  Between 7am to 6pm - Pager - [phone]  After 6pm call admission pager [phone]  [facility] Office  [phone]  CC: Primary care physician; [facility], [employer]

## 2018-10-14 NOTE — Patient Instructions (Signed)

## 2018-10-14 NOTE — Progress Notes (Signed)
Confirmed with Infection Prevention that patient will not be re-swabbed for COVID during this admission. Madlyn Frankel, RN

## 2018-10-14 NOTE — Telephone Encounter (Signed)
Patient informed he will need to go to ER after OV to be admitted per Dr. Mike Gip and Dr. Verlin Grills recommendation. Patient verbalizes understanding and agrees to go.

## 2018-10-14 NOTE — ED Provider Notes (Signed)
[facility] Emergency Department Provider Note    First MD Initiated Contact with Patient 10/14/18 1349     (approximate)  I have reviewed the triage vital signs and the nursing notes.   HISTORY  Chief Complaint Abnormal Lab    HPI George Gomez is a 58 y.o. male the below his past medical history presents from hematology clinic due to concern for melena and falling hemoglobin.  Patient was just discharged the hospital yesterday to diarrheal illness and dehydration.  States that the melena has resolved.  He states he feels well denies any fatigue shortness of breath or chest pain.  No abdominal pain.  No nausea or vomiting.  Does have a long history of alcohol use.  Denies any history of cirrhosis.    Past Medical History:  Diagnosis Date  . Anemia   . Diabetes mellitus without complication (HCC)   . Diverticulitis    Pt states diverticulitis  . EtOH dependence (HCC)   . Hypercholesteremia   . Hypertension    Family History  Problem Relation Age of Onset  . Rheum arthritis Mother   . Diabetes Father   . Heart disease Father   . Cancer Father   . Cancer Maternal Grandfather   . Cancer Paternal Grandfather    Past Surgical History:  Procedure Laterality Date  . ESOPHAGOGASTRODUODENOSCOPY N/A 07/02/2018   Procedure: ESOPHAGOGASTRODUODENOSCOPY (EGD);  Surgeon: Toney ReilVanga, Rohini Reddy, MD;  [facility];  Service: Gastroenterology;  Laterality: N/A;  . ESOPHAGOGASTRODUODENOSCOPY (EGD) WITH PROPOFOL N/A 06/01/2017   Procedure: ESOPHAGOGASTRODUODENOSCOPY (EGD) WITH PROPOFOL;  Surgeon: Wyline MoodAnna, Kiran, MD;  [facility];  Service: Gastroenterology;  Laterality: N/A;  . none     Patient Active Problem List   Diagnosis Date Noted  . Anemia 10/14/2018  . Acute renal failure (HCC) 10/12/2018  . Weight loss 09/15/2018  . Renal insufficiency 09/15/2018  . Folate deficiency 07/25/2018  . B12 deficiency 12/11/2017  . Normocytic anemia  12/03/2017  . Thrombocytopenia (HCC) 12/03/2017  . Leukopenia 12/03/2017  . GI bleed 05/30/2017  . Acute renal failure (ARF) (HCC) 08/28/2016  . Acute on chronic renal failure (HCC) 03/20/2016  . Dehydration 03/20/2016  . Lactic acidosis 03/20/2016  . Hyponatremia 03/20/2016  . Alcohol abuse 03/20/2016  . Clostridium difficile diarrhea 03/18/2016      Prior to Admission medications   Medication Sig Start Date End Date Taking? Authorizing Provider  allopurinol (ZYLOPRIM) 300 MG tablet Take 300 mg by mouth daily.    Yes [provider]  amLODipine (NORVASC) 5 MG tablet Take 5 mg by mouth daily.  06/29/18  Yes [provider]  atorvastatin (LIPITOR) 40 MG tablet Take 40 mg by mouth daily.    Yes [provider]  gabapentin (NEURONTIN) 300 MG capsule Take 300 mg by mouth 3 (three) times daily.    Yes [provider]  losartan (COZAAR) 50 MG tablet Take 1 tablet (50 mg total) by mouth daily. 10/13/18  Yes Enedina FinnerPatel, Sona, MD  Multiple Vitamin (MULTIVITAMIN WITH MINERALS) TABS tablet Take 1 tablet by mouth daily. 10/14/18  Yes Enedina FinnerPatel, Sona, MD  naltrexone (DEPADE) 50 MG tablet Take 50 mg by mouth daily.  06/15/18  Yes [provider]  pantoprazole (PROTONIX) 40 MG tablet Take 1 tablet (40 mg total) by mouth 2 (two) times daily before a meal. Patient taking differently: Take 40 mg by mouth daily.  07/02/18 10/14/18 Yes Sainani, Rolly PancakeVivek J, MD  propranolol ER (INDERAL LA) 60 MG 24 hr  capsule Take 60 mg by mouth daily.  06/29/18  Yes [provider]    Allergies Patient has no known allergies.    Social History Social History   Tobacco Use  . Smoking status: Never Smoker  . Smokeless tobacco: Never Used  Substance Use Topics  . Alcohol use: Yes    Alcohol/week: 6.0 standard drinks    Types: 6 Shots of liquor per week    Comment: 0.5 pint a day of liquor  . Drug use: No    Review of Systems Patient denies headaches, rhinorrhea, blurry vision,  numbness, shortness of breath, chest pain, edema, cough, abdominal pain, nausea, vomiting, diarrhea, dysuria, fevers, rashes or hallucinations unless otherwise stated above in HPI. ____________________________________________   PHYSICAL EXAM:  VITAL SIGNS: Vitals:   10/14/18 1329  BP: 123/63  Pulse: 67  Resp: 14  Temp: 98.7 F (37.1 C)  SpO2: 100%    Constitutional: Alert and oriented.  Eyes: Conjunctivae are normal.  Head: Atraumatic. Nose: No congestion/rhinnorhea. Mouth/Throat: Mucous membranes are moist.   Neck: No stridor. Painless ROM.  Cardiovascular: Normal rate, regular rhythm. Grossly normal heart sounds.  Good peripheral circulation. Respiratory: Normal respiratory effort.  No retractions. Lungs CTAB. Gastrointestinal: Soft and nontender. No distention. No abdominal bruits. No CVA tenderness. Genitourinary:  Musculoskeletal: No lower extremity tenderness nor edema.  No joint effusions. Neurologic:  Normal speech and language. No gross focal neurologic deficits are appreciated. No facial droop Skin:  Skin is warm, dry and intact. No rash noted. Psychiatric: Mood and affect are normal. Speech and behavior are normal.  ____________________________________________   LABS (all labs ordered are listed, but only abnormal results are displayed)  Results for orders placed or performed during the hospital encounter of 10/14/18 (from the past 24 hour(s))  Type and screen [place]     Status: None   Collection Time: 10/14/18  1:55 PM  Result Value Ref Range   ABO/RH(D) O POS    Antibody Screen NEG    Sample Expiration      10/17/2018,2359 Performed at [facility], [address] [place], [place] [address]   CBC with Differential/Platelet     Status: Abnormal   Collection Time: 10/14/18  2:21 PM  Result Value Ref Range   WBC 4.1 4.0 - 10.5 K/uL   RBC 2.57 (L) 4.22 - 5.81 MIL/uL   Hemoglobin 7.4 (L) 13.0 - 17.0 g/dL   HCT 22.9 (L)  39.0 - 52.0 %   MCV 89.1 80.0 - 100.0 fL   MCH 28.8 26.0 - 34.0 pg   MCHC 32.3 30.0 - 36.0 g/dL   RDW 18.0 (H) 11.5 - 15.5 %   Platelets 181 150 - 400 K/uL   nRBC 1.5 (H) 0.0 - 0.2 %   Neutrophils Relative % 47 %   Neutro Abs 2.0 1.7 - 7.7 K/uL   Lymphocytes Relative 20 %   Lymphs Abs 0.8 0.7 - 4.0 K/uL   Monocytes Relative 26 %   Monocytes Absolute 1.1 (H) 0.1 - 1.0 K/uL   Eosinophils Relative 1 %   Eosinophils Absolute 0.1 0.0 - 0.5 K/uL   Basophils Relative 1 %   Basophils Absolute 0.0 0.0 - 0.1 K/uL   Immature Granulocytes 5 %   Abs Immature Granulocytes 0.20 (H) 0.00 - 0.07 K/uL  Comprehensive metabolic panel     Status: Abnormal   Collection Time: 10/14/18  2:21 PM  Result Value Ref Range   Sodium 136 135 - 145  mmol/L   Potassium 3.6 3.5 - 5.1 mmol/L   Chloride 102 98 - 111 mmol/L   CO2 22 22 - 32 mmol/L   Glucose, Bld 120 (H) 70 - 99 mg/dL   BUN 10 6 - 20 mg/dL   Creatinine, Ser 4.65 0.61 - 1.24 mg/dL   Calcium 8.5 (L) 8.9 - 10.3 mg/dL   Total Protein 7.6 6.5 - 8.1 g/dL   Albumin 4.0 3.5 - 5.0 g/dL   AST 96 (H) 15 - 41 U/L   ALT 46 (H) 0 - 44 U/L   Alkaline Phosphatase 115 38 - 126 U/L   Total Bilirubin 0.6 0.3 - 1.2 mg/dL   GFR calc non Af Amer >60 >60 mL/min   GFR calc Af Amer >60 >60 mL/min   Anion gap 12 5 - 15   ____________________________________________ ____________________________________________  RADIOLOGY   ____________________________________________   PROCEDURES  Procedure(s) performed:  Procedures    Critical Care performed: no ____________________________________________   INITIAL IMPRESSION / ASSESSMENT AND PLAN / ED COURSE  Pertinent labs & imaging results that were available during my care of the patient were reviewed by me and considered in my medical decision making (see chart for details).   DDX: Gastritis, PUD, upper GI bleed, lower GI bleed, chronic anemia  George Gomez is a 58 y.o. who presents to the ED with symptoms  as described above.  Patient hemodynamically stable.  Patient sent for admission for endoscopy due to concern for melena and GI bleed.  He is currently hemodynamically stable.  Case was discussed in consultation with Dr. and recommends admission PPI gtt.  Have discussed with the patient and available family all diagnostics and treatments performed thus far and all questions were answered to the best of my ability. The patient demonstrates understanding and agreement with plan.      The patient was evaluated in Emergency Department today for the symptoms described in the history of present illness. He/she was evaluated in the context of the global COVID-19 pandemic, which necessitated consideration that the patient might be at risk for infection with the SARS-CoV-2 virus that causes COVID-19. Institutional protocols and algorithms that pertain to the evaluation of patients at risk for COVID-19 are in a state of rapid change based on information released by regulatory bodies including the CDC and federal and state organizations. These policies and algorithms were followed during the patient's care in the ED.  As part of my medical decision making, I reviewed the following data within the electronic MEDICAL RECORD NUMBER Nursing notes reviewed and incorporated, Labs reviewed, notes from prior ED visits and [place] Controlled Substance Database   ____________________________________________   FINAL CLINICAL IMPRESSION(S) / ED DIAGNOSES  Final diagnoses:  Anemia, unspecified type      NEW MEDICATIONS STARTED DURING THIS VISIT:  New Prescriptions   No medications on file     Note:  This document was prepared using Dragon voice recognition software and may include unintentional dictation errors.    Willy Eddy, MD 10/14/18 [phone]

## 2018-10-14 NOTE — Progress Notes (Signed)
The patient states he just got out the ED due to diarrhea and nausea with vomiting/

## 2018-10-15 DIAGNOSIS — Z833 Family history of diabetes mellitus: Secondary | ICD-10-CM | POA: Diagnosis not present

## 2018-10-15 DIAGNOSIS — G4730 Sleep apnea, unspecified: Secondary | ICD-10-CM | POA: Diagnosis present

## 2018-10-15 DIAGNOSIS — E7800 Pure hypercholesterolemia, unspecified: Secondary | ICD-10-CM | POA: Diagnosis present

## 2018-10-15 DIAGNOSIS — Z8261 Family history of arthritis: Secondary | ICD-10-CM | POA: Diagnosis not present

## 2018-10-15 DIAGNOSIS — D649 Anemia, unspecified: Secondary | ICD-10-CM | POA: Diagnosis present

## 2018-10-15 DIAGNOSIS — K921 Melena: Secondary | ICD-10-CM | POA: Diagnosis present

## 2018-10-15 DIAGNOSIS — Z79899 Other long term (current) drug therapy: Secondary | ICD-10-CM | POA: Diagnosis not present

## 2018-10-15 DIAGNOSIS — K922 Gastrointestinal hemorrhage, unspecified: Secondary | ICD-10-CM | POA: Diagnosis not present

## 2018-10-15 DIAGNOSIS — K5730 Diverticulosis of large intestine without perforation or abscess without bleeding: Secondary | ICD-10-CM | POA: Diagnosis present

## 2018-10-15 DIAGNOSIS — Z8249 Family history of ischemic heart disease and other diseases of the circulatory system: Secondary | ICD-10-CM | POA: Diagnosis not present

## 2018-10-15 DIAGNOSIS — E119 Type 2 diabetes mellitus without complications: Secondary | ICD-10-CM | POA: Diagnosis present

## 2018-10-15 DIAGNOSIS — E538 Deficiency of other specified B group vitamins: Secondary | ICD-10-CM | POA: Diagnosis present

## 2018-10-15 DIAGNOSIS — Z23 Encounter for immunization: Secondary | ICD-10-CM | POA: Diagnosis not present

## 2018-10-15 DIAGNOSIS — D62 Acute posthemorrhagic anemia: Secondary | ICD-10-CM | POA: Diagnosis present

## 2018-10-15 DIAGNOSIS — Z20828 Contact with and (suspected) exposure to other viral communicable diseases: Secondary | ICD-10-CM | POA: Diagnosis present

## 2018-10-15 DIAGNOSIS — N179 Acute kidney failure, unspecified: Secondary | ICD-10-CM | POA: Diagnosis present

## 2018-10-15 DIAGNOSIS — K219 Gastro-esophageal reflux disease without esophagitis: Secondary | ICD-10-CM | POA: Diagnosis present

## 2018-10-15 DIAGNOSIS — Z7984 Long term (current) use of oral hypoglycemic drugs: Secondary | ICD-10-CM | POA: Diagnosis not present

## 2018-10-15 DIAGNOSIS — D500 Iron deficiency anemia secondary to blood loss (chronic): Secondary | ICD-10-CM | POA: Diagnosis not present

## 2018-10-15 DIAGNOSIS — R700 Elevated erythrocyte sedimentation rate: Secondary | ICD-10-CM | POA: Diagnosis present

## 2018-10-15 DIAGNOSIS — F1020 Alcohol dependence, uncomplicated: Secondary | ICD-10-CM | POA: Diagnosis present

## 2018-10-15 DIAGNOSIS — D509 Iron deficiency anemia, unspecified: Secondary | ICD-10-CM | POA: Diagnosis present

## 2018-10-15 DIAGNOSIS — I10 Essential (primary) hypertension: Secondary | ICD-10-CM | POA: Diagnosis present

## 2018-10-15 LAB — PREPARE RBC (CROSSMATCH)

## 2018-10-15 LAB — CBC
HCT: 20.9 % — ABNORMAL LOW (ref 39.0–52.0)
Hemoglobin: 6.8 g/dL — ABNORMAL LOW (ref 13.0–17.0)
MCH: 28.9 pg (ref 26.0–34.0)
MCHC: 32.5 g/dL (ref 30.0–36.0)
MCV: 88.9 fL (ref 80.0–100.0)
Platelets: 171 10*3/uL (ref 150–400)
RBC: 2.35 MIL/uL — ABNORMAL LOW (ref 4.22–5.81)
RDW: 17.9 % — ABNORMAL HIGH (ref 11.5–15.5)
WBC: 3.2 10*3/uL — ABNORMAL LOW (ref 4.0–10.5)
nRBC: 2.2 % — ABNORMAL HIGH (ref 0.0–0.2)

## 2018-10-15 LAB — GLUCOSE, CAPILLARY
Glucose-Capillary: 110 mg/dL — ABNORMAL HIGH (ref 70–99)
Glucose-Capillary: 160 mg/dL — ABNORMAL HIGH (ref 70–99)
Glucose-Capillary: 90 mg/dL (ref 70–99)
Glucose-Capillary: 85 mg/dL (ref 70–99)

## 2018-10-15 LAB — BASIC METABOLIC PANEL
Anion gap: 8 (ref 5–15)
BUN: 8 mg/dL (ref 6–20)
CO2: 22 mmol/L (ref 22–32)
Calcium: 8.3 mg/dL — ABNORMAL LOW (ref 8.9–10.3)
Chloride: 110 mmol/L (ref 98–111)
Creatinine, Ser: 0.88 mg/dL (ref 0.61–1.24)
GFR calc Af Amer: >60 mL/min (ref >60)
GFR calc non Af Amer: >60 mL/min (ref >60)
Glucose, Bld: 122 mg/dL — ABNORMAL HIGH (ref 70–99)
Potassium: 3.5 mmol/L (ref 3.5–5.1)
Sodium: 140 mmol/L (ref 135–145)

## 2018-10-15 LAB — FERRITIN: Ferritin: 1267 ng/mL — ABNORMAL HIGH (ref 24–336)

## 2018-10-15 LAB — HEMOGLOBIN A1C
Hgb A1c MFr Bld: 6.1 % — ABNORMAL HIGH (ref 4.8–5.6)
Mean Plasma Glucose: 128 mg/dL

## 2018-10-15 LAB — IRON AND TIBC
Iron: 34 ug/dL — ABNORMAL LOW (ref 45–182)
Saturation Ratios: 16 % — ABNORMAL LOW (ref 17.9–39.5)
TIBC: 219 ug/dL — ABNORMAL LOW (ref 250–450)
UIBC: 185 ug/dL

## 2018-10-15 MED ORDER — PEG 3350-KCL-NA BICARB-NACL 420 G PO SOLR
4000.0000 mL | Freq: Once | ORAL | Status: AC
  Administered 2018-10-15: 18:00:00 4000 mL via ORAL
  Filled 2018-10-15: qty 4000

## 2018-10-15 MED ORDER — SODIUM CHLORIDE 0.9% IV SOLUTION
Freq: Once | INTRAVENOUS | Status: AC
  Administered 2018-10-15: 18:00:00 via INTRAVENOUS

## 2018-10-15 MED ORDER — SODIUM CHLORIDE 0.9 % IV SOLN
INTRAVENOUS | Status: DC
  Administered 2018-10-16: 09:00:00 via INTRAVENOUS

## 2018-10-15 NOTE — Consult Note (Signed)
Wyline Mood , MD [address], [place], [place], [address] [address], [place], [place], [address] Phone: [phone]  Fax: [phone]  Consultation  Referring Provider:   Dr. Valla Leaver primary Care Physician:  [facility], [facility] Primary Gastroenterologist:  Dr. Tobi Bastos         Reason for Consultation:     GI bleed  Date of Admission:  10/14/2018 Date of Consultation:  10/15/2018         HPI:   Emmanuel Busler is a 58 y.o. male is known to myself as an outpatient and was last seen at the office on 09/01/2017.  At that point of time he was also seen for melena.  He had been on Goody powders at that point of time and an EGD performed on 06/01/2017 demonstrated duodenitis felt likely secondary to NSAID use and biopsies confirmed the same.  He has a history of alcohol excess consumption.  He was recently admitted on 10/12/2018 and discharged a day later on 10/13/2018.  Presented with AKI secondary to vomiting which was treated conservatively with IV fluids.  A similar presentation in 07/02/2018 and he underwent an upper endoscopy demonstrated portal hypertensive gastropathy.  Last colonoscopy in [facility] in 2016 and one tubular adenoma was excised.  On this admission was referred to the hospital by Dr. Merlene Pulling after he complained of being dizzy and a hemoglobin of 7.5 g.  He follows with Dr. Baldo Ash) for anemia and B12 deficiency s.  He states that he last had black stool 4 days back but none in the last 4 days that he has been having brown stools.  Denies any NSAID use.  Past Medical History:  Diagnosis Date  . Anemia   . Diabetes mellitus without complication (HCC)   . Diverticulitis    Pt states diverticulitis  . EtOH dependence (HCC)   . Hypercholesteremia   . Hypertension     Past Surgical History:  Procedure Laterality Date  . ESOPHAGOGASTRODUODENOSCOPY N/A 07/02/2018   Procedure: ESOPHAGOGASTRODUODENOSCOPY (EGD);  Surgeon: Toney Reil, MD;  Location: [facility]  ENDOSCOPY;  Service: Gastroenterology;  Laterality: N/A;  . ESOPHAGOGASTRODUODENOSCOPY (EGD) WITH PROPOFOL N/A 06/01/2017   Procedure: ESOPHAGOGASTRODUODENOSCOPY (EGD) WITH PROPOFOL;  Surgeon: Wyline Mood, MD;  Location: [facility] ENDOSCOPY;  Service: Gastroenterology;  Laterality: N/A;  . none      Prior to Admission medications   Medication Sig Start Date End Date Taking? Authorizing Provider  allopurinol (ZYLOPRIM) 300 MG tablet Take 300 mg by mouth daily.    Yes [provider]  amLODipine (NORVASC) 5 MG tablet Take 5 mg by mouth daily.  06/29/18  Yes [provider]  atorvastatin (LIPITOR) 40 MG tablet Take 40 mg by mouth daily.    Yes [provider]  gabapentin (NEURONTIN) 300 MG capsule Take 300 mg by mouth 3 (three) times daily.    Yes [provider]  losartan (COZAAR) 50 MG tablet Take 1 tablet (50 mg total) by mouth daily. 10/13/18  Yes Enedina Finner, MD  Multiple Vitamin (MULTIVITAMIN WITH MINERALS) TABS tablet Take 1 tablet by mouth daily. 10/14/18  Yes Enedina Finner, MD  naltrexone (DEPADE) 50 MG tablet Take 50 mg by mouth daily.  06/15/18  Yes [provider]  pantoprazole (PROTONIX) 40 MG tablet Take 1 tablet (40 mg total) by mouth 2 (two) times daily before a meal. Patient taking differently: Take 40 mg by mouth daily.  07/02/18 10/14/18 Yes Houston Siren, MD  propranolol ER [facility]  LA) 60 MG 24 hr capsule Take 60 mg by mouth daily.  06/29/18  Yes [provider]    Family History  Problem Relation Age of Onset  . Rheum arthritis Mother   . Diabetes Father   . Heart disease Father   . Cancer Father   . Cancer Maternal Grandfather   . Cancer Paternal Grandfather      Social History   Tobacco Use  . Smoking status: Never Smoker  . Smokeless tobacco: Never Used  Substance Use Topics  . Alcohol use: Yes    Alcohol/week: 6.0 standard drinks    Types: 6 Shots of liquor per week    Comment: 0.5 pint a day of liquor  . Drug use:  No    Allergies as of 10/14/2018  . (No Known Allergies)    Review of Systems:    All systems reviewed and negative except where noted in HPI.   Physical Exam:  Vital signs in last 24 hours: Temp:  [97.3 F (36.3 C)-98.7 F (37.1 C)] 98 F (36.7 C) (09/18 0509) Pulse Rate:  [67-73] 69 (09/18 0509) Resp:  [14-18] 18 (09/18 0509) BP: (109-134)/(63-103) 134/86 (09/18 0509) SpO2:  [97 %-100 %] 97 % (09/18 0509) Weight:  [94 kg] 94 kg (09/17 1329) Last BM Date: 10/14/18 General:   Pleasant, cooperative in NAD Head:  Normocephalic and atraumatic. Eyes:   No icterus.   Conjunctiva pink. PERRLA. Ears:  Normal auditory acuity. Neck:  Supple; no masses or thyroidomegaly Lungs: Respirations even and unlabored. Lungs clear to auscultation bilaterally.   No wheezes, crackles, or rhonchi.  Heart:  Regular rate and rhythm;  Without murmur, clicks, rubs or gallops Abdomen:  Soft, nondistended, nontender. Normal bowel sounds. No appreciable masses or hepatomegaly.  No rebound or guarding.  Neurologic:  Alert and oriented x3;  grossly normal neurologically. Skin:  Intact without significant lesions or rashes. Cervical Nodes:  No significant cervical adenopathy. Psych:  Alert and cooperative. Normal affect.  LAB RESULTS: Recent Labs    10/14/18 1007 10/14/18 1421 10/15/18 0546  WBC 4.3 4.1 3.2*  HGB 7.5* 7.4* 6.8*  HCT 23.3* 22.9* 20.9*  PLT 174 181 171   BMET Recent Labs    10/14/18 1102 10/14/18 1421 10/15/18 0546  NA 134* 136 140  K 3.7 3.6 3.5  CL 104 102 110  CO2 23 22 22   GLUCOSE 130* 120* 122*  BUN 11 10 8   CREATININE 1.06 1.00 0.88  CALCIUM 8.1* 8.5* 8.3*   LFT Recent Labs    10/14/18 1421  PROT 7.6  ALBUMIN 4.0  AST 96*  ALT 46*  ALKPHOS 115  BILITOT 0.6   PT/INR No results for input(s): LABPROT, INR in the last 72 hours.  STUDIES: No results found.    Impression / Plan:   Esmeralda ArthurMichael Fullwood is a 58 y.o. y/o male with history of alcohol dependence  admitted with a history of melena 4 days back.  He has been seen by myself and Dr. Allegra LaiVanga in the past for melena and has had 2 endoscopies as well.  Neither of these have shown features of bleeding although he was noted to have portal hypertensive gastropathy in the last one about 3 months back.  His last episode of melena was 4 days back and since has had brown stool.  He has a history of iron deficiency and hence I would suggest that we proceed with colonoscopy tomorrow.  If the colonoscopy were to be normal he would also  require an upper endoscopy to be repeated.  If both of these are normal I think we should proceed with capsule study of the small bowel.  He can have clear liquids until this evening.  He should strongly abstain from drinking alcohol.  I have discussed alternative options, risks & benefits,  which include, but are not limited to, bleeding, infection, perforation,respiratory complication & drug reaction.  The patient agrees with this plan & written consent will be obtained.     Thank you for involving me in the care of this patient.      LOS: 0 days   Jonathon Bellows, MD  10/15/2018, 9:19 AM

## 2018-10-15 NOTE — Progress Notes (Signed)
[place] [facility] [place] NAME: George Gomez    MR#:  [id number]  DATE OF BIRTH:  05/08/1960  SUBJECTIVE:  patient came in after he was found to have hemoglobin of 7.5 when he went for his routine follow-up appointment with Dr. Mike Gip. Denies any nausea vomiting. Patient now tells me he has had melena for several days on and off denies any abdominal pain. He said he took couple ibuprofen's in the last few days. REVIEW OF SYSTEMS:   Review of Systems  Constitutional: Negative for chills, fever and weight loss.  HENT: Negative for ear discharge, ear pain and nosebleeds.   Eyes: Negative for blurred vision, pain and discharge.  Respiratory: Negative for sputum production, shortness of breath, wheezing and stridor.   Cardiovascular: Negative for chest pain, palpitations, orthopnea and PND.  Gastrointestinal: Negative for abdominal pain, diarrhea, nausea and vomiting.  Genitourinary: Negative for frequency and urgency.  Musculoskeletal: Negative for back pain and joint pain.  Neurological: Negative for sensory change, speech change, focal weakness and weakness.  Psychiatric/Behavioral: Negative for depression and hallucinations. The patient is not nervous/anxious.    Tolerating Diet: clear liquid Tolerating PT: not needed  DRUG ALLERGIES:  No Known Allergies  VITALS:  Blood pressure 118/77, pulse 67, temperature 98.7 F (37.1 C), temperature source Oral, resp. rate (!) 24, height 5' 11"  (1.803 m), weight 94 kg, SpO2 100 %.  PHYSICAL EXAMINATION:   Physical Exam  GENERAL:  58 y.o.-year-old patient lying in the bed with no acute distress.  EYES: Pupils equal, round, reactive to light and accommodation. No scleral icterus. Extraocular muscles intact. Pallor+ HEENT: Head atraumatic, normocephalic. Oropharynx and nasopharynx clear.  NECK:  Supple, no jugular venous distention. No thyroid enlargement, no tenderness.  LUNGS: Normal breath  sounds bilaterally, no wheezing, rales, rhonchi. No use of accessory muscles of respiration.  CARDIOVASCULAR: S1, S2 normal. No murmurs, rubs, or gallops.  ABDOMEN: Soft, nontender, nondistended. Bowel sounds present. No organomegaly or mass.  EXTREMITIES: No cyanosis, clubbing or edema b/l.    NEUROLOGIC: Cranial nerves II through XII are intact. No focal Motor or sensory deficits b/l.   PSYCHIATRIC:  patient is alert and oriented x 3.  SKIN: No obvious rash, lesion, or ulcer.   LABORATORY PANEL:  CBC Recent Labs  Lab 10/15/18 0546  WBC 3.2*  HGB 6.8*  HCT 20.9*  PLT 171    Chemistries  Recent Labs  Lab 10/13/18 0248  10/14/18 1421 10/15/18 0546  NA 135   < > 136 140  K 3.7   < > 3.6 3.5  CL 103   < > 102 110  CO2 23   < > 22 22  GLUCOSE 129*   < > 120* 122*  BUN 20   < > 10 8  CREATININE 1.77*   < > 1.00 0.88  CALCIUM 8.0*   < > 8.5* 8.3*  MG 3.2*  --   --   --   AST  --   --  96*  --   ALT  --   --  46*  --   ALKPHOS  --   --  115  --   BILITOT  --   --  0.6  --    < > = values in this interval not displayed.   Cardiac Enzymes No results for input(s): TROPONINI in the last 168 hours. RADIOLOGY:  No results found. ASSESSMENT AND PLAN:  George Gomez  is a  58 y.o. male with a known history of anemia.  Patient was recently in the hospital for acute kidney injury and was given IV fluid hydration.  The patient states he feels okay.  His hemoglobin has been in the 8-9's and upon recent discharge   1.  Acute on chronic blood loss anemia with melena a few days back.  - Dr. Vicente Males was contacted for colonoscopy and possible endoscopy tomorrow.  N.p.o. after midnight.   -IV Protonix drip - I will give IV Venofer.  -Hemoglobin 7.7 --- 6.9 --- 2 unit blood transfusion --- CBC -patient reports taking 800 mg ibuprofen few days ago. -EGD in June 2020 showed gastritis. -Colonoscopy at [facility] in 2017 showed internal hemorrhoids, sigmoid diverticulosis, polyp  2.  Alcohol  dependence with elevations of liver function test no signs of withdrawal at this time -patient says he has quit  3.  Hypertension.  Hold off on losartan.  Continue Norvasc and propranolol.  4.  Type 2 diabetes mellitus.  Sliding scale for right now.  Continue to hold metformin  5.  Recent admission for acute kidney injury secondary to GI symptoms.  Creatinine has improved.  6.  Elevation of ESR.  Check rheumatoid factor and ANA. Recent serum protein electrophoresis looked okay.  With normal white blood cell count and no fever now or recent hospitalization doubt infection as the cause.  Case discussed with Care Management/Social Worker. Management plans discussed with the patient and they are in agreement.  CODE STATUS: full  DVT Prophylaxis: scd  TOTAL TIME TAKING CARE OF THIS PATIENT: *30* minutes.  >50% time spent on counselling and coordination of care  POSSIBLE D/C IN *1-2* DAYS, DEPENDING ON CLINICAL CONDITION.  Note: This dictation was prepared with Dragon dictation along with smaller phrase technology. Any transcriptional errors that result from this process are unintentional.  Fritzi Mandes M.D on 10/15/2018 at 2:11 PM  Between 7am to 6pm - Pager - [phone]  After 6pm go to [email or web address] - password [email or web address] [place]  Office  [phone]  CC: Primary care physician; [facility], [place] ID: George Gomez, male   DOB: April 16, 1960, 58 y.o.   MRN: [id number]

## 2018-10-16 ENCOUNTER — Inpatient Hospital Stay: Payer: Medicare Other | Admitting: Anesthesiology

## 2018-10-16 ENCOUNTER — Encounter
Admission: EM | Disposition: A | Discharge status: Home / Self Care | Payer: Self-pay | Source: Home / Self Care | Attending: Internal Medicine

## 2018-10-16 DIAGNOSIS — D500 Iron deficiency anemia secondary to blood loss (chronic): Secondary | ICD-10-CM

## 2018-10-16 DIAGNOSIS — K922 Gastrointestinal hemorrhage, unspecified: Secondary | ICD-10-CM

## 2018-10-16 DIAGNOSIS — K921 Melena: Principal | ICD-10-CM

## 2018-10-16 DIAGNOSIS — K5730 Diverticulosis of large intestine without perforation or abscess without bleeding: Secondary | ICD-10-CM

## 2018-10-16 HISTORY — PX: ENTEROSCOPY: SHX5533

## 2018-10-16 HISTORY — PX: COLONOSCOPY WITH PROPOFOL: SHX5780

## 2018-10-16 LAB — TYPE AND SCREEN
ABO/RH(D): O POS
Antibody Screen: NEGATIVE
Unit division: 0
Unit division: 0
Unit division: 0

## 2018-10-16 LAB — GLUCOSE, CAPILLARY
Glucose-Capillary: 106 mg/dL — ABNORMAL HIGH (ref 70–99)
Glucose-Capillary: 123 mg/dL — ABNORMAL HIGH (ref 70–99)
Glucose-Capillary: 109 mg/dL — ABNORMAL HIGH (ref 70–99)
Glucose-Capillary: 107 mg/dL — ABNORMAL HIGH (ref 70–99)

## 2018-10-16 LAB — BPAM RBC
Blood Product Expiration Date: 202009192359
Blood Product Expiration Date: 202010182359
Blood Product Expiration Date: 202010182359
ISSUE DATE / TIME: 202009181338
ISSUE DATE / TIME: 202009181359
ISSUE DATE / TIME: 202009181728
Unit Type and Rh: 5100
Unit Type and Rh: 5100
Unit Type and Rh: 9500

## 2018-10-16 LAB — HEMOGLOBIN AND HEMATOCRIT, BLOOD
HCT: 29 % — ABNORMAL LOW (ref 39.0–52.0)
Hemoglobin: 9.4 g/dL — ABNORMAL LOW (ref 13.0–17.0)

## 2018-10-16 LAB — RHEUMATOID FACTOR: Rheumatoid fact SerPl-aCnc: 11.2 IU/mL (ref 0.0–13.9)

## 2018-10-16 LAB — ANA W/REFLEX IF POSITIVE: Anti Nuclear Antibody (ANA): NEGATIVE

## 2018-10-16 SURGERY — ENTEROSCOPY
Anesthesia: Monitor Anesthesia Care

## 2018-10-16 MED ORDER — SODIUM CHLORIDE 0.9% FLUSH
3.0000 mL | Freq: Two times a day (BID) | INTRAVENOUS | Status: DC
  Administered 2018-10-16 (×2): 3 mL via INTRAVENOUS

## 2018-10-16 MED ORDER — LIDOCAINE HCL (CARDIAC) PF 100 MG/5ML IV SOSY
PREFILLED_SYRINGE | INTRAVENOUS | Status: DC | PRN
  Administered 2018-10-16: 100 mg via INTRAVENOUS

## 2018-10-16 MED ORDER — PROPOFOL 500 MG/50ML IV EMUL
INTRAVENOUS | Status: AC
  Filled 2018-10-16: qty 50

## 2018-10-16 MED ORDER — PROPOFOL 500 MG/50ML IV EMUL
INTRAVENOUS | Status: DC | PRN
  Administered 2018-10-16: 250 ug/kg/min via INTRAVENOUS

## 2018-10-16 MED ORDER — SODIUM CHLORIDE 0.9% FLUSH: 3.0000 mL | INTRAVENOUS | Status: DC | PRN

## 2018-10-16 NOTE — Progress Notes (Signed)
[facility] [place] NAME: George Gomez    MR#:  [id number]  DATE OF BIRTH:  [birth year]  SUBJECTIVE:  patient denies any complaints. No abdominal pain or blood he stools. No vomiting. Awaiting G.I. procedures today. Had colonoscopy prep yesterday. Status post two unit blood transfusion REVIEW OF SYSTEMS:   Review of Systems  Constitutional: Negative for chills, fever and weight loss.  HENT: Negative for ear discharge, ear pain and nosebleeds.   Eyes: Negative for blurred vision, pain and discharge.  Respiratory: Negative for sputum production, shortness of breath, wheezing and stridor.   Cardiovascular: Negative for chest pain, palpitations, orthopnea and PND.  Gastrointestinal: Positive for melena. Negative for abdominal pain, diarrhea, nausea and vomiting.  Genitourinary: Negative for frequency and urgency.  Musculoskeletal: Negative for back pain and joint pain.  Neurological: Positive for weakness. Negative for sensory change, speech change and focal weakness.  Psychiatric/Behavioral: Negative for depression and hallucinations. The patient is not nervous/anxious.    Tolerating Diet: npo Tolerating PT: not needed  DRUG ALLERGIES:  No Known Allergies  VITALS:  Blood pressure 139/82, pulse (!) 59, temperature 97.8 F (36.6 C), temperature source Oral, resp. rate 19, height '5\' 11"'$  (1.803 m), weight 94 kg, SpO2 99 %.  PHYSICAL EXAMINATION:   Physical Exam  GENERAL:  58 y.o.-year-old patient lying in the bed with no acute distress.  EYES: Pupils equal, round, reactive to light and accommodation. No scleral icterus. Extraocular muscles intact. Pallor+ HEENT: Head atraumatic, normocephalic. Oropharynx and nasopharynx clear.  NECK:  Supple, no jugular venous distention. No thyroid enlargement, no tenderness.  LUNGS: Normal breath sounds bilaterally, no wheezing, rales, rhonchi. No use of accessory muscles of respiration.   CARDIOVASCULAR: S1, S2 normal. No murmurs, rubs, or gallops.  ABDOMEN: Soft, nontender, nondistended. Bowel sounds present. No organomegaly or mass.  EXTREMITIES: No cyanosis, clubbing or edema b/l.    NEUROLOGIC: Cranial nerves II through XII are intact. No focal Motor or sensory deficits b/l.   PSYCHIATRIC:  patient is alert and oriented x 3.  SKIN: No obvious rash, lesion, or ulcer.   LABORATORY PANEL:  CBC Recent Labs  Lab 10/15/18 0546 10/15/18 2251  WBC 3.2*  --   HGB 6.8* 9.4*  HCT 20.9* 29.0*  PLT 171  --     Chemistries  Recent Labs  Lab 10/13/18 0248  10/14/18 1421 10/15/18 0546  NA 135   < > 136 140  K 3.7   < > 3.6 3.5  CL 103   < > 102 110  CO2 23   < > 22 22  GLUCOSE 129*   < > 120* 122*  BUN 20   < > 10 8  CREATININE 1.77*   < > 1.00 0.88  CALCIUM 8.0*   < > 8.5* 8.3*  MG 3.2*  --   --   --   AST  --   --  96*  --   ALT  --   --  46*  --   ALKPHOS  --   --  115  --   BILITOT  --   --  0.6  --    < > = values in this interval not displayed.   Cardiac Enzymes No results for input(s): TROPONINI in the last 168 hours. RADIOLOGY:  No results found. ASSESSMENT AND PLAN:  George Gomez  is a 58 y.o. male with a known history of anemia.  Patient was recently in the  hospital for acute kidney injury and was given IV fluid hydration.  The patient states he feels okay.  His hemoglobin has been in the 8-9's and upon recent discharge   1.  Acute on chronic blood loss anemia with melena a few days back.  - Dr. Marius Ditch for colonoscopy and possible endoscopy today -  N.p.o.   -IV Protonix drip - I will give IV Venofer.  -Hemoglobin 7.7 --- 6.9 --- 2 unit blood transfusion --- 9.4 -patient reports taking 800 mg ibuprofen few days ago. -EGD in June 2020 showed gastritis. -Colonoscopy at [facility] in 2017 showed internal hemorrhoids, sigmoid diverticulosis, polyp  2.  Alcohol dependence with elevations of liver function test no signs of withdrawal at this time -patient  says he has quit  3.  Hypertension.  Hold off on losartan.  Continue Norvasc and propranolol.  4.  Type 2 diabetes mellitus.  Sliding scale for right now.  Continue to hold metformin  5.  Recent admission for acute kidney injury secondary to GI symptoms.  Creatinine has improved.  6.  Elevation of ESR.  Check rheumatoid factor and ANA. Recent serum protein electrophoresis looked okay.  With normal white blood cell count and no fever now or recent hospitalization doubt infection as the cause.  Case discussed with Care Management/Social Worker. Management plans discussed with the patient and they are in agreement.  CODE STATUS: full  DVT Prophylaxis: scd  TOTAL TIME TAKING CARE OF THIS PATIENT: *30* minutes.  >50% time spent on counselling and coordination of care  POSSIBLE D/C IN *1-2* DAYS, DEPENDING ON CLINICAL CONDITION.  Note: This dictation was prepared with Dragon dictation along with smaller phrase technology. Any transcriptional errors that result from this process are unintentional.  Fritzi Mandes M.D on 10/16/2018 at 7:50 AM  Between 7am to 6pm - Pager - [phone]  After 6pm go to [email or web address] - password [email or web address] [facility]  Office  [phone]  CC: Primary care physician; Inc, [place] ID: George Gomez, male   DOB: 01/20/1961, 58 y.o.   MRN: [id number]

## 2018-10-16 NOTE — Anesthesia Post-op Follow-up Note (Signed)
Anesthesia QCDR form completed.        

## 2018-10-16 NOTE — Transfer of Care (Signed)
Immediate Anesthesia Transfer of Care Note  Patient: George Gomez  Procedure(s) Performed: ENTEROSCOPY (N/A ) COLONOSCOPY WITH PROPOFOL (N/A )  Patient Location: PACU and Endoscopy Unit  Anesthesia Type:MAC  Level of Consciousness: drowsy  Airway & Oxygen Therapy: Patient Spontanous Breathing and Patient connected to nasal cannula oxygen  Post-op Assessment: Report given to RN and Post -op Vital signs reviewed and stable  Post vital signs: Reviewed and stable  Last Vitals:  Vitals Value Taken Time  BP 93/56 10/16/18 0928  Temp 36.8 C 10/16/18 0928  Pulse    Resp 20 10/16/18 0928  SpO2 100 % 10/16/18 0928    Last Pain:  Vitals:   10/16/18 0928  TempSrc: Skin  PainSc: 0-No pain         Complications: No apparent anesthesia complications

## 2018-10-16 NOTE — Anesthesia Preprocedure Evaluation (Signed)
Anesthesia Evaluation  Patient identified by MRN, date of birth, ID band Patient awake    Reviewed: Allergy & Precautions, NPO status , Patient's Chart, lab work & pertinent test results  History of Anesthesia Complications Negative for: history of anesthetic complications  Airway Mallampati: III       Dental   Pulmonary sleep apnea (not using CPAP) , neg COPD, Not current smoker,           Cardiovascular hypertension, Pt. on medications (-) Past MI and (-) CHF (-) dysrhythmias (-) Valvular Problems/Murmurs     Neuro/Psych neg Seizures    GI/Hepatic Neg liver ROS, GERD  Medicated and Controlled,  Endo/Other  diabetes, Type 2, Oral Hypoglycemic Agents  Renal/GU negative Renal ROS     Musculoskeletal   Abdominal   Peds  Hematology   Anesthesia Other Findings   Reproductive/Obstetrics                             Anesthesia Physical Anesthesia Plan  ASA: III  Anesthesia Plan: General   Post-op Pain Management:    Induction: Intravenous  PONV Risk Score and Plan: 2 and Propofol infusion and TIVA  Airway Management Planned: Nasal Cannula  Additional Equipment:   Intra-op Plan:   Post-operative Plan:   Informed Consent: I have reviewed the patients History and Physical, chart, labs and discussed the procedure including the risks, benefits and alternatives for the proposed anesthesia with the patient or authorized representative who has indicated his/her understanding and acceptance.       Plan Discussed with:   Anesthesia Plan Comments:         Anesthesia Quick Evaluation

## 2018-10-16 NOTE — Op Note (Signed)
[facility] Gastroenterology Patient Name: George Gomez Procedure Date: 10/16/2018 8:39 AM MRN: [id number] Account #: [id number] Date of Birth: [birth year] Admit Type: Outpatient Age: 58 Room: [facility] ENDO ROOM 4 Gender: Male Note Status: Finalized Procedure:            Colonoscopy Indications:          Melena, Evaluation of unexplained GI bleeding                        presenting with Melena (upper GI source has been                        excluded) Providers:            Lin Landsman MD, MD Medicines:            Monitored Anesthesia Care Complications:        No immediate complications. Estimated blood loss: None. Procedure:            Pre-Anesthesia Assessment:                       - Prior to the procedure, a History and Physical was                        performed, and patient medications and allergies were                        reviewed. The patient is competent. The risks and                        benefits of the procedure and the sedation options and                        risks were discussed with the patient. All questions                        were answered and informed consent was obtained.                        Patient identification and proposed procedure were                        verified by the physician, the nurse, the                        anesthesiologist, the anesthetist and the technician in                        the pre-procedure area in the procedure room in the                        endoscopy suite. Mental Status Examination: alert and                        oriented. Airway Examination: normal oropharyngeal                        airway and neck mobility. Respiratory Examination:  clear to auscultation. CV Examination: normal.                        Prophylactic Antibiotics: The patient does not require                        prophylactic antibiotics. Prior Anticoagulants: The                         patient has taken no previous anticoagulant or                        antiplatelet agents. ASA Grade Assessment: III - A                        patient with severe systemic disease. After reviewing                        the risks and benefits, the patient was deemed in                        satisfactory condition to undergo the procedure. The                        anesthesia plan was to use monitored anesthesia care                        (MAC). Immediately prior to administration of                        medications, the patient was re-assessed for adequacy                        to receive sedatives. The heart rate, respiratory rate,                        oxygen saturations, blood pressure, adequacy of                        pulmonary ventilation, and response to care were                        monitored throughout the procedure. The physical status                        of the patient was re-assessed after the procedure.                       After obtaining informed consent, the colonoscope was                        passed under direct vision. Throughout the procedure,                        the patient's blood pressure, pulse, and oxygen                        saturations were monitored continuously. The                        Colonoscope  was introduced through the anus and                        advanced to the the cecum, identified by appendiceal                        orifice and ileocecal valve. After obtaining informed                        consent, the colonoscope was passed under direct                        vision. Throughout the procedure, the patient's blood                        pressure, pulse, and oxygen saturations were monitored                        continuously.The colonoscopy was unusually difficult                        due to significant looping and the patient's body                        habitus. Successful completion of the procedure was                         aided by changing the patient to a supine position,                        changing the patient to a prone position and applying                        abdominal pressure. The patient tolerated the procedure                        well. The quality of the bowel preparation was fair. Findings:      The perianal and digital rectal examinations were normal. Pertinent       negatives include normal sphincter tone and no palpable rectal lesions.      Multiple diverticula were found in the entire colon.      The retroflexed view of the distal rectum and anal verge was normal and       showed no anal or rectal abnormalities.      Normal mucosa was found in the entire colon. Impression:           - Diverticulosis in the entire examined colon.                       - The distal rectum and anal verge are normal on                        retroflexion view.                       - Normal mucosa in the entire examined colon.                       - No specimens collected. Recommendation:       - To  visualize the small bowel, perform video capsule                        endoscopy tomorrow.                       - Clear liquid diet today.                       - Continue present medications. Procedure Code(s):    --- Professional ---                       [phone], Colonoscopy, flexible; diagnostic, including                        collection of specimen(s) by brushing or washing, when                        performed (separate procedure) Diagnosis Code(s):    --- Professional ---                       K92.1, Melena (includes Hematochezia)                       K57.30, Diverticulosis of large intestine without                        perforation or abscess without bleeding CPT copyright 2019 American Medical Association. All rights reserved. The codes documented in this report are preliminary and upon coder review may  be revised to meet current compliance requirements. Dr. Ulyess Mort Lin Landsman MD, MD 10/16/2018 9:30:23 AM This report has been signed electronically. Number of Addenda: 0 Note Initiated On: 10/16/2018 8:39 AM Scope Withdrawal Time: 0 hours 7 minutes 17 seconds  Total Procedure Duration: 0 hours 18 minutes 36 seconds  Estimated Blood Loss: Estimated blood loss: none.      [facility]

## 2018-10-16 NOTE — Op Note (Signed)
[facility] Gastroenterology Patient Name: George Gomez Procedure Date: 10/16/2018 8:39 AM MRN: [id number] Account #: [id number] Date of Birth: [birth year] Admit Type: Outpatient Age: 58 Room: [facility] ENDO ROOM 4 Gender: Male Note Status: Finalized Procedure:            Small bowel enteroscopy Indications:          Iron deficiency anemia secondary to chronic blood loss,                        Melena, GI bleeding source not documented by previous                        UGI endoscopy Providers:            Lin Landsman MD, MD Medicines:            Monitored Anesthesia Care Complications:        No immediate complications. Estimated blood loss: None. Procedure:            Pre-Anesthesia Assessment:                       - Prior to the procedure, a History and Physical was                        performed, and patient medications and allergies were                        reviewed. The patient is competent. The risks and                        benefits of the procedure and the sedation options and                        risks were discussed with the patient. All questions                        were answered and informed consent was obtained.                        Patient identification and proposed procedure were                        verified by the physician, the nurse, the                        anesthesiologist, the anesthetist and the technician in                        the pre-procedure area in the procedure room in the                        endoscopy suite. Mental Status Examination: alert and                        oriented. Airway Examination: normal oropharyngeal                        airway and neck mobility. Respiratory Examination:  clear to auscultation. CV Examination: normal.                        Prophylactic Antibiotics: The patient does not require                        prophylactic antibiotics. Prior Anticoagulants:  The                        patient has taken no previous anticoagulant or                        antiplatelet agents. ASA Grade Assessment: III - A                        patient with severe systemic disease. After reviewing                        the risks and benefits, the patient was deemed in                        satisfactory condition to undergo the procedure. The                        anesthesia plan was to use monitored anesthesia care                        (MAC). Immediately prior to administration of                        medications, the patient was re-assessed for adequacy                        to receive sedatives. The heart rate, respiratory rate,                        oxygen saturations, blood pressure, adequacy of                        pulmonary ventilation, and response to care were                        monitored throughout the procedure. The physical status                        of the patient was re-assessed after the procedure.                       After obtaining informed consent, the endoscope was                        passed under direct vision. Throughout the procedure,                        the patient's blood pressure, pulse, and oxygen                        saturations were monitored continuously. The                        Colonoscope  was introduced through the mouth and                        advanced to the mid-jejunum. The small bowel                        enteroscopy was accomplished without difficulty. The                        patient tolerated the procedure well. Findings:      There was no evidence of significant pathology in the proximal jejunum       and in the mid-jejunum.      There was no evidence of significant pathology in the entire examined       duodenum.      The esophagus was normal.      The stomach was normal. Impression:           - The examined portion of the jejunum was normal.                       - Normal examined  duodenum.                       - Normal esophagus.                       - Normal stomach.                       - No specimens collected. Recommendation:       - Perform a colonoscopy today. Procedure Code(s):    --- Professional ---                       [phone], Small intestinal endoscopy, enteroscopy beyond                        second portion of duodenum, not including ileum;                        diagnostic, including collection of specimen(s) by                        brushing or washing, when performed (separate procedure) Diagnosis Code(s):    --- Professional ---                       D50.0, Iron deficiency anemia secondary to blood loss                        (chronic)                       K92.1, Melena (includes Hematochezia)                       K92.2, Gastrointestinal hemorrhage, unspecified CPT copyright 2019 American Medical Association. All rights reserved. The codes documented in this report are preliminary and upon coder review may  be revised to meet current compliance requirements. Dr. Ulyess Mort Lin Landsman MD, MD 10/16/2018 9:06:26 AM This report has been signed electronically. Number of Addenda: 0 Note Initiated On: 10/16/2018 8:39 AM Estimated Blood Loss: Estimated blood loss: none.  [facility]

## 2018-10-16 NOTE — Progress Notes (Signed)
EGD and colonscopy completed with pt tolerating well. Denies co's. Informed consent obtained for Givens capsule procedure tomorrow. Denies co's/concern; denies any sign/symptom bleeding. Protonix drip continued.

## 2018-10-16 NOTE — Anesthesia Postprocedure Evaluation (Signed)
Anesthesia Post Note  Patient: George Gomez  Procedure(s) Performed: ENTEROSCOPY (N/A ) COLONOSCOPY WITH PROPOFOL (N/A )  Patient location during evaluation: Endoscopy Anesthesia Type: MAC Level of consciousness: awake and alert Pain management: pain level controlled Vital Signs Assessment: post-procedure vital signs reviewed and stable Respiratory status: spontaneous breathing and respiratory function stable Cardiovascular status: stable Anesthetic complications: no     Last Vitals:  Vitals:   10/16/18 0958 10/16/18 1018  BP: 125/85 (!) 162/95  Pulse: 62 (!) 56  Resp: 17 20  Temp:  (!) 36.4 C  SpO2: 100% 100%    Last Pain:  Vitals:   10/16/18 1018  TempSrc: Oral  PainSc:                  KEPHART,WILLIAM K

## 2018-10-17 ENCOUNTER — Encounter
Admission: EM | Disposition: A | Discharge status: Home / Self Care | Payer: Self-pay | Source: Home / Self Care | Attending: Internal Medicine

## 2018-10-17 HISTORY — PX: GIVENS CAPSULE STUDY: SHX5432

## 2018-10-17 LAB — GLUCOSE, CAPILLARY
Glucose-Capillary: 94 mg/dL (ref 70–99)
Glucose-Capillary: 93 mg/dL (ref 70–99)

## 2018-10-17 SURGERY — IMAGING PROCEDURE, GI TRACT, INTRALUMINAL, VIA CAPSULE

## 2018-10-17 MED ORDER — PANTOPRAZOLE SODIUM 40 MG PO TBEC: 40.0000 mg | DELAYED_RELEASE_TABLET | Freq: Every day | ORAL | 1 refills | Status: DC

## 2018-10-17 NOTE — Discharge Instructions (Signed)
Avoid NSAIDS, BC powder, alcohol

## 2018-10-17 NOTE — Progress Notes (Signed)
Pt transported to private vehicle in transport chair. Discharged home to self care.

## 2018-10-17 NOTE — Progress Notes (Signed)
Givens capsule test complete. Pt states ready for discharge home self care. Oral and written AVS instructions given with questions answered to his satisfaction. Declined POCT glucose test/SSI prior to discharge.

## 2018-10-17 NOTE — Discharge Summary (Signed)
[facility] [place] at [facility]   PATIENT NAME: George Gomez    MR#:  [id number]  DATE OF BIRTH:  Jun 03, 1960  DATE OF ADMISSION:  10/14/2018 ADMITTING PHYSICIAN: Alford Highland, MD  DATE OF DISCHARGE: 9/120/2020  PRIMARY CARE PHYSICIAN: [facility]    ADMISSION DIAGNOSIS:  Anemia, unspecified type [D64.9]  DISCHARGE DIAGNOSIS:  Acute on Chronic anemia with h/o gastritis--GI w/u so far negative HTN Dm-2--not on any po meds  SECONDARY DIAGNOSIS:   Past Medical History:  Diagnosis Date  . Anemia   . Diabetes mellitus without complication (HCC)   . Diverticulitis    Pt states diverticulitis  . EtOH dependence (HCC)   . Hypercholesteremia   . Hypertension     HOSPITAL COURSE:  MichaelFulleris a58 y.o.malewith a known history of anemia. Patient was recently in the hospital for acute kidney injury and was given IV fluid hydration. The patient states he feels okay. His hemoglobin has been in the 8-9's and upon recent discharge   1. Acute on chronic blood loss anemia with melena a few days back.  -Dr. Allegra Lai for colonoscopy and possible endoscopy today - N.p.o.  -IV Protonix drip -I will give IV Venofer.  -Hemoglobin 7.7 --- 6.9 --- 2 unit blood transfusion --- 9.4 -patient reports taking 800 mg ibuprofen few days ago. -EGD in June 2020 showed gastritis. -Small bowel enteroscopy negative 10/16/18  -Colonoscopy at [facility] in 2017 showed internal hemorrhoids, sigmoid diverticulosis, polyp -Colonoscopy normal 10/16/2018  -Capsule endoscopy done on sept 20th--results to be follwed ast out pt  2. Alcohol dependence with elevations of liver function test no signs of withdrawal at this time -patient says he has quit  3. Hypertension. Hold off on losartan. Continue Norvasc and propranolol.  4. Type 2 diabetes mellitus. Sliding scale for right now. Continue to hold metformin since sugars are well controlled  5.  Recent admission for acute kidney injury secondary to GI symptoms. Creatinine has improved.  Overall stable. D/c home later today Ok with GI CONSULTS OBTAINED:  Treatment Team:  Wyline Mood, MD Toney Reil, MD  DRUG ALLERGIES:  No Known Allergies  DISCHARGE MEDICATIONS:   Allergies as of 10/17/2018   No Known Allergies     Medication List    TAKE these medications   allopurinol 300 MG tablet Commonly known as: ZYLOPRIM Take 300 mg by mouth daily.   amLODipine 5 MG tablet Commonly known as: NORVASC Take 5 mg by mouth daily.   atorvastatin 40 MG tablet Commonly known as: LIPITOR Take 40 mg by mouth daily.   gabapentin 300 MG capsule Commonly known as: NEURONTIN Take 300 mg by mouth 3 (three) times daily.   losartan 50 MG tablet Commonly known as: COZAAR Take 1 tablet (50 mg total) by mouth daily.   multivitamin with minerals Tabs tablet Take 1 tablet by mouth daily.   naltrexone 50 MG tablet Commonly known as: DEPADE Take 50 mg by mouth daily.   pantoprazole 40 MG tablet Commonly known as: PROTONIX Take 1 tablet (40 mg total) by mouth daily.   propranolol ER 60 MG 24 hr capsule Commonly known as: INDERAL LA Take 60 mg by mouth daily.       If you experience worsening of your admission symptoms, develop shortness of breath, life threatening emergency, suicidal or homicidal thoughts you must seek medical attention immediately by calling 911 or calling your MD immediately  if symptoms less severe.  You Must read complete instructions/literature along  with all the possible adverse reactions/side effects for all the Medicines you take and that have been prescribed to you. Take any new Medicines after you have completely understood and accept all the possible adverse reactions/side effects.   Please note  You were cared for by a hospitalist during your hospital stay. If you have any questions about your discharge medications or the care you received  while you were in the hospital after you are discharged, you can call the unit and asked to speak with the hospitalist on call if the hospitalist that took care of you is not available. Once you are discharged, your primary care physician will handle any further medical issues. Please note that NO REFILLS for any discharge medications will be authorized once you are discharged, as it is imperative that you return to your primary care physician (or establish a relationship with a primary care physician if you do not have one) for your aftercare needs so that they can reassess your need for medications and monitor your lab values. Today   SUBJECTIVE    Doing well VITAL SIGNS:  Blood pressure (!) 167/81, pulse (!) 51, temperature 98.3 F (36.8 C), temperature source Oral, resp. rate 20, height 5\' 11"  (1.803 m), weight 94 kg, SpO2 100 %.  I/O:    Intake/Output Summary (Last 24 hours) at 10/17/2018 1137 Last data filed at 10/17/2018 1100 Gross per 24 hour  Intake 791.72 ml  Output -  Net 791.72 ml    PHYSICAL EXAMINATION:  GENERAL:  58 y.o.-year-old patient lying in the bed with no acute distress.  EYES: Pupils equal, round, reactive to light and accommodation. No scleral icterus. Extraocular muscles intact.  HEENT: Head atraumatic, normocephalic. Oropharynx and nasopharynx clear.  NECK:  Supple, no jugular venous distention. No thyroid enlargement, no tenderness.  LUNGS: Normal breath sounds bilaterally, no wheezing, rales,rhonchi or crepitation. No use of accessory muscles of respiration.  CARDIOVASCULAR: S1, S2 normal. No murmurs, rubs, or gallops.  ABDOMEN: Soft, non-tender, non-distended. Bowel sounds present. No organomegaly or mass.  EXTREMITIES: No pedal edema, cyanosis, or clubbing.  NEUROLOGIC: Cranial nerves II through XII are intact. Muscle strength 5/5 in all extremities. Sensation intact. Gait not checked.  PSYCHIATRIC: The patient is alert and oriented x 3.  SKIN: No obvious  rash, lesion, or ulcer.   DATA REVIEW:   CBC  Recent Labs  Lab 10/15/18 0546 10/15/18 2251  WBC 3.2*  --   HGB 6.8* 9.4*  HCT 20.9* 29.0*  PLT 171  --     Chemistries  Recent Labs  Lab 10/13/18 0248  10/14/18 1421 10/15/18 0546  NA 135   < > 136 140  K 3.7   < > 3.6 3.5  CL 103   < > 102 110  CO2 23   < > 22 22  GLUCOSE 129*   < > 120* 122*  BUN 20   < > 10 8  CREATININE 1.77*   < > 1.00 0.88  CALCIUM 8.0*   < > 8.5* 8.3*  MG 3.2*  --   --   --   AST  --   --  96*  --   ALT  --   --  46*  --   ALKPHOS  --   --  115  --   BILITOT  --   --  0.6  --    < > = values in this interval not displayed.    Microbiology Results   Recent  Results (from the past 240 hour(s))  SARS CORONAVIRUS 2 (TAT 6-24 HRS) Nasopharyngeal Nasopharyngeal Swab     Status: None   Collection Time: 10/12/18  2:14 PM   Specimen: Nasopharyngeal Swab  Result Value Ref Range Status   SARS Coronavirus 2 NEGATIVE NEGATIVE Final    Comment: (NOTE) SARS-CoV-2 target nucleic acids are NOT DETECTED. The SARS-CoV-2 RNA is generally detectable in upper and lower respiratory specimens during the acute phase of infection. Negative results do not preclude SARS-CoV-2 infection, do not rule out co-infections with other pathogens, and should not be used as the sole basis for treatment or other patient management decisions. Negative results must be combined with clinical observations, patient history, and epidemiological information. The expected result is Negative. Fact Sheet for Patients: HairSlick.nohttps://www.fda.gov/media/138098/download Fact Sheet for Healthcare Providers: quierodirigir.comhttps://www.fda.gov/media/138095/download This test is not yet approved or cleared by the [place] FDA and  has been authorized for detection and/or diagnosis of SARS-CoV-2 by FDA under an Emergency Use Authorization (EUA). This EUA will remain  in effect (meaning this test can be used) for the duration of the COVID-19 declaration under  Section 56 4(b)(1) of the Act, 21 U.S.C. section 360bbb-3(b)(1), unless the authorization is terminated or revoked sooner. Performed at [facility], [address] [place], [place] [address]     RADIOLOGY:  No results found.   CODE STATUS:     Code Status Orders  (From admission, onward)         Start     Ordered   10/14/18 1449  Full code  Continuous     10/14/18 1449        Code Status History    Date Active Date Inactive Code Status Order ID Comments User Context   10/12/2018 1600 10/13/2018 2057 Full Code [id number]  Enedina FinnerPatel, Penny Arrambide, MD Inpatient   07/01/2018 2230 07/02/2018 2125 Full Code [id number]  Pearletha AlfredSeals, Angela H, NP ED   05/30/2017 2025 06/01/2017 1825 Full Code [id number]  [place] SirenSainani, Vivek J, MD Inpatient   08/28/2016 1309 08/29/2016 1823 Full Code [id number]  Houston SirenSainani, Vivek J, MD Inpatient   03/18/2016 0427 03/20/2016 1353 Full Code [id number]  Ihor AustinPyreddy, Pavan, MD Inpatient   Advance Care Planning Activity      TOTAL TIME TAKING CARE OF THIS PATIENT: *40 minutes.    Enedina FinnerSona Janeli Lewison M.D on 10/17/2018 at 11:37 AM  Between 7am to 6pm - Pager - [phone] After 6pm go to [email or web address] - Social research officer, governmentpassword [email or web address]  [facility]  Office  [phone]  CC: Primary care physician; [facility]

## 2018-10-17 NOTE — Progress Notes (Signed)
Pt states he is ready to be discharged home as soon as his camera procedure is over. Denies co/s; pain/ signs or symptoms bleeding. Protonix drip discontinued with course completed. Givens test still in process with pt following test guidelines. Up ad lib in room. Will notify MD when test complete for discharge orders.

## 2018-10-18 ENCOUNTER — Telehealth: Payer: Self-pay | Admitting: Gastroenterology

## 2018-10-18 ENCOUNTER — Encounter: Payer: Self-pay | Admitting: Gastroenterology

## 2018-10-18 NOTE — Telephone Encounter (Signed)
Patient called & states he had capsule study 10-17-18 & would like the results.

## 2018-10-20 ENCOUNTER — Telehealth: Payer: Self-pay | Admitting: Gastroenterology

## 2018-10-20 NOTE — Telephone Encounter (Signed)
Patient called & would like his results for the capsule study.

## 2018-10-20 NOTE — Telephone Encounter (Signed)
Please advised if his capsule study is back. Tried to call patient to inform patient that we do not have the capsule study back yet but as soon as I did I would give him a call. Unable to leave a message because mailbox was full

## 2018-10-21 ENCOUNTER — Telehealth: Payer: Self-pay | Admitting: Gastroenterology

## 2018-10-21 ENCOUNTER — Other Ambulatory Visit: Payer: Self-pay

## 2018-10-21 DIAGNOSIS — K921 Melena: Secondary | ICD-10-CM

## 2018-10-21 DIAGNOSIS — D696 Thrombocytopenia, unspecified: Secondary | ICD-10-CM

## 2018-10-21 HISTORY — DX: Melena: K92.1

## 2018-10-21 NOTE — Telephone Encounter (Signed)
Tried to call patient but patient voicemail is full unable to leave a message

## 2018-10-21 NOTE — Telephone Encounter (Signed)
Tried to call patient again but patient voicemail is full unable to leave a message

## 2018-10-21 NOTE — Telephone Encounter (Signed)
Pt is calling for results on his Camera Pill study  Please call pt

## 2018-10-21 NOTE — Telephone Encounter (Signed)
Patient states he did not touch the recorder while using the bathroom. Patient verbalized understanding of the results

## 2018-10-21 NOTE — Telephone Encounter (Signed)
Documented patient response In other telephone call

## 2018-10-21 NOTE — Telephone Encounter (Signed)
Tried to call patient but mailbox is full  

## 2018-10-21 NOTE — Telephone Encounter (Signed)
Tried to call patient but patient voicemail is full unable to leave a message  

## 2018-10-21 NOTE — Telephone Encounter (Signed)
Please call patient, let him know the below results Ask him if he turned off the recorder when he as using bathroom or something  Capsule reached cecum However, study is incomplete due to several skipped small bowel images. Possible that patient may have turned off the recorder intermittently No lesions detected in the visualized portions of small bowel on this limited exam  Recs:  Suboptimal study Repeat VCE if patient agreeable Follow up in my office  Cephas Darby, MD [place]  [place], [place] [address]  Main: [phone]  Fax: [phone] Pager: [phone]

## 2018-10-22 NOTE — Telephone Encounter (Signed)
Informed patient of results on 10/21/2018

## 2018-10-24 ENCOUNTER — Encounter: Payer: Self-pay | Admitting: Hematology and Oncology

## 2018-10-24 ENCOUNTER — Other Ambulatory Visit: Payer: Self-pay

## 2018-10-25 ENCOUNTER — Inpatient Hospital Stay (HOSPITAL_BASED_OUTPATIENT_CLINIC_OR_DEPARTMENT_OTHER): Payer: Medicare Other | Admitting: Hematology and Oncology

## 2018-10-25 ENCOUNTER — Inpatient Hospital Stay: Payer: Medicare Other

## 2018-10-25 ENCOUNTER — Encounter: Payer: Self-pay | Admitting: Hematology and Oncology

## 2018-10-25 VITALS — BP 139/77 | HR 51 | Temp 98.5°F | Resp 18 | Ht 71.0 in | Wt 209.4 lb

## 2018-10-25 DIAGNOSIS — K760 Fatty (change of) liver, not elsewhere classified: Secondary | ICD-10-CM | POA: Diagnosis not present

## 2018-10-25 DIAGNOSIS — I10 Essential (primary) hypertension: Secondary | ICD-10-CM | POA: Insufficient documentation

## 2018-10-25 DIAGNOSIS — D61818 Other pancytopenia: Secondary | ICD-10-CM | POA: Diagnosis present

## 2018-10-25 DIAGNOSIS — N289 Disorder of kidney and ureter, unspecified: Secondary | ICD-10-CM | POA: Diagnosis not present

## 2018-10-25 DIAGNOSIS — D649 Anemia, unspecified: Secondary | ICD-10-CM

## 2018-10-25 DIAGNOSIS — E785 Hyperlipidemia, unspecified: Secondary | ICD-10-CM | POA: Diagnosis not present

## 2018-10-25 DIAGNOSIS — E538 Deficiency of other specified B group vitamins: Secondary | ICD-10-CM | POA: Insufficient documentation

## 2018-10-25 DIAGNOSIS — Z79899 Other long term (current) drug therapy: Secondary | ICD-10-CM | POA: Insufficient documentation

## 2018-10-25 DIAGNOSIS — E860 Dehydration: Secondary | ICD-10-CM | POA: Insufficient documentation

## 2018-10-25 DIAGNOSIS — E119 Type 2 diabetes mellitus without complications: Secondary | ICD-10-CM | POA: Diagnosis not present

## 2018-10-25 DIAGNOSIS — Z8249 Family history of ischemic heart disease and other diseases of the circulatory system: Secondary | ICD-10-CM | POA: Diagnosis not present

## 2018-10-25 DIAGNOSIS — D696 Thrombocytopenia, unspecified: Secondary | ICD-10-CM

## 2018-10-25 LAB — CBC WITH DIFFERENTIAL/PLATELET
Abs Immature Granulocytes: 0.02 10*3/uL (ref 0.00–0.07)
Basophils Absolute: 0.1 10*3/uL (ref 0.0–0.1)
Basophils Relative: 1 %
Eosinophils Absolute: 0.1 10*3/uL (ref 0.0–0.5)
Eosinophils Relative: 1 %
HCT: 28.3 % — ABNORMAL LOW (ref 39.0–52.0)
Hemoglobin: 9.1 g/dL — ABNORMAL LOW (ref 13.0–17.0)
Immature Granulocytes: 0 %
Lymphocytes Relative: 25 %
Lymphs Abs: 1.2 10*3/uL (ref 0.7–4.0)
MCH: 29.3 pg (ref 26.0–34.0)
MCHC: 32.2 g/dL (ref 30.0–36.0)
MCV: 91 fL (ref 80.0–100.0)
Monocytes Absolute: 0.6 10*3/uL (ref 0.1–1.0)
Monocytes Relative: 13 %
Neutro Abs: 2.9 10*3/uL (ref 1.7–7.7)
Neutrophils Relative %: 60 %
Platelets: 201 10*3/uL (ref 150–400)
RBC: 3.11 MIL/uL — ABNORMAL LOW (ref 4.22–5.81)
RDW: 17.7 % — ABNORMAL HIGH (ref 11.5–15.5)
WBC: 4.8 10*3/uL (ref 4.0–10.5)
nRBC: 0 % (ref 0.0–0.2)

## 2018-10-25 LAB — SAMPLE TO BLOOD BANK

## 2018-10-25 NOTE — Progress Notes (Signed)
[facility]  [address], [address] [place], [place] [address] Phone: [phone]  Fax: [phone]   Clinic Day:  10/25/2018  Referring physician: [facility]  Chief Complaint: George ArthurMichael Gomez is a 58 y.o. male with a normocytic anemia and B12 deficiency who is seen for assessment following recent admission.   HPI: The patient was last seen in the hematology clinic on 10/14/2018. At that time, he was dizzy when bending over.  He had had melena.  Hemoglobin was 7.5.  Creatinine was 1.06.  Ferritin was 1267 with an iron saturation of 16% and a TIBC 219.  Sed Rate >140. He was referred to the ER.  [facility] through 10/17/2018.  He received 400 mg Venofer on 10/14/2018.  He received 2 units of PRBCs on 10/15/2018 for a hemoglobin of 6.8.  Discharge hematocrit was 29.0 and hemoglobin 9.4.  He was seen by Dr. Wyline MoodKiran Anna of GI.    Colonoscopy on 10/16/2018 revealed diverticulosis in the entire examined colon. The distal rectum and anal verge were normal on retroflexion view.  There was normal mucosa in the entire examined colon. No specimens collected. Small bowel enteroscopy was entirely normal (esophagus, stomach, duodenum, proximal and mid-jejunum). No specimens were collected. Capsule endoscopy on 10/17/2018 was sub-optimal.  There were several skip small bowel images possibly due to the patient turning of the recorder.  No lesions were visualized in the bowel on the limited exam.  Repeat VCE was suggested  During the interim, he has felt much better.  Recently, he stopped drinking alcohol on 10/13/2018; patient states that he feels better than he has in a while. Being around peers who drink doesn't bother him. He is also a recovering crack addict for 10 years. Patient says diabetes is doing well but doesn't have at home glucose monitor; PCP recommends that he gets one   Past Medical History:  Diagnosis Date  . Anemia   .  Diabetes mellitus without complication (HCC)   . Diverticulitis    Pt states diverticulitis  . EtOH dependence (HCC)   . Hypercholesteremia   . Hypertension     Past Surgical History:  Procedure Laterality Date  . COLONOSCOPY WITH PROPOFOL N/A 10/16/2018   Procedure: COLONOSCOPY WITH PROPOFOL;  Surgeon: Toney ReilVanga, Rohini Reddy, MD;  [facility];  Service: Gastroenterology;  Laterality: N/A;  . ENTEROSCOPY N/A 10/16/2018   Procedure: ENTEROSCOPY;  Surgeon: Toney ReilVanga, Rohini Reddy, MD;  [facility];  Service: Gastroenterology;  Laterality: N/A;  . ESOPHAGOGASTRODUODENOSCOPY N/A 07/02/2018   Procedure: ESOPHAGOGASTRODUODENOSCOPY (EGD);  Surgeon: Toney ReilVanga, Rohini Reddy, MD;  [facility];  Service: Gastroenterology;  Laterality: N/A;  . ESOPHAGOGASTRODUODENOSCOPY (EGD) WITH PROPOFOL N/A 06/01/2017   Procedure: ESOPHAGOGASTRODUODENOSCOPY (EGD) WITH PROPOFOL;  Surgeon: Wyline MoodAnna, Kiran, MD;  [facility];  Service: Gastroenterology;  Laterality: N/A;  . GIVENS CAPSULE STUDY N/A 10/17/2018   Procedure: GIVENS CAPSULE STUDY;  Surgeon: Toney ReilVanga, Rohini Reddy, MD;  [facility];  Service: Gastroenterology;  Laterality: N/A;  . none      Family History  Problem Relation Age of Onset  . Rheum arthritis Mother   . Diabetes Father   . Heart disease Father   . Cancer Father   . Cancer Maternal Grandfather   . Cancer Paternal Grandfather     Social History:  reports that he has never smoked. He has never used smokeless tobacco. He reports current alcohol use of about 6.0 standard drinks of alcohol per  week. He reports that he does not use drugs. He drank liquor 2-3 x/day. He is drinking less than he did in the past (moonshine), about 2 shots daily. He did "crack" 10-15 years ago. He lives in [place]. Is no longer socially drinking, He says that if can overcome doing crack he can overcome drinking alcohol. The patient is alone today.  Allergies: No Known  Allergies  Current Medications: Current Outpatient Medications  Medication Sig Dispense Refill  . allopurinol (ZYLOPRIM) 300 MG tablet Take 300 mg by mouth daily.     Marland Kitchen amLODipine (NORVASC) 5 MG tablet Take 5 mg by mouth daily.     Marland Kitchen atorvastatin (LIPITOR) 40 MG tablet Take 40 mg by mouth daily.     Marland Kitchen gabapentin (NEURONTIN) 300 MG capsule Take 300 mg by mouth 3 (three) times daily.   3  . losartan (COZAAR) 50 MG tablet Take 1 tablet (50 mg total) by mouth daily. 30 tablet 1  . Multiple Vitamin (MULTIVITAMIN WITH MINERALS) TABS tablet Take 1 tablet by mouth daily. 30 tablet 0  . naltrexone (DEPADE) 50 MG tablet Take 50 mg by mouth daily.     . pantoprazole (PROTONIX) 40 MG tablet Take 1 tablet (40 mg total) by mouth daily. 30 tablet 1  . propranolol ER (INDERAL LA) 60 MG 24 hr capsule Take 60 mg by mouth daily.      No current facility-administered medications for this visit.     Review of Systems  Constitutional: Negative for chills, fever, malaise/fatigue and weight loss (up 2 pounds).       Feels "ok".  HENT: Negative.  Negative for congestion, ear pain, hearing loss, nosebleeds, sinus pain and sore throat.   Eyes: Negative.  Negative for blurred vision and double vision.  Respiratory: Negative.  Negative for cough, hemoptysis and shortness of breath.   Cardiovascular: Negative.  Negative for chest pain, palpitations and orthopnea.  Gastrointestinal: Negative for abdominal pain, blood in stool, constipation, diarrhea, heartburn, melena, nausea and vomiting.       Eating well.  Genitourinary: Negative for dysuria, frequency and urgency.       H/o kidney stones.  Musculoskeletal: Negative.  Negative for back pain, falls, joint pain and myalgias.  Skin: Negative.  Negative for itching and rash.  Neurological: Negative.  Negative for dizziness, tingling, sensory change, speech change, focal weakness, weakness and headaches.  Endo/Heme/Allergies: Does not bruise/bleed easily.        Diabetes is "ok".  Psychiatric/Behavioral: Negative.  Negative for depression and memory loss. The patient is not nervous/anxious and does not have insomnia.   All other systems reviewed and are negative.  Performance status (ECOG):  0  Vitals Blood pressure 139/77, pulse (!) 51, temperature 98.5 F (36.9 C), temperature source Tympanic, resp. rate 18, height 5\' 11"  (1.803 m), weight 209 lb 7 oz (95 kg), SpO2 100 %.   Physical Exam  Constitutional: He is oriented to person, place, and time. He appears well-developed and well-nourished. No distress. Face mask in place.  HENT:  Head: Normocephalic and atraumatic.  Mouth/Throat: Oropharynx is clear and moist. No oropharyngeal exudate.  Wearing a cap.  Graying hair.  Mask.  Eyes: Pupils are equal, round, and reactive to light. Conjunctivae and EOM are normal. No scleral icterus.  Brown eyes.   Neck: Normal range of motion. Neck supple. No JVD present.  Cardiovascular: Normal rate, regular rhythm and normal heart sounds.  No murmur heard. Pulmonary/Chest: Effort normal and breath sounds normal. No respiratory  distress. He has no wheezes. He has no rales. He exhibits no tenderness.  Abdominal: Soft. Bowel sounds are normal. He exhibits no distension and no mass. There is no abdominal tenderness. There is no rebound and no guarding.  Musculoskeletal: Normal range of motion.        General: No tenderness or edema.  Lymphadenopathy:    He has no cervical adenopathy.    He has no axillary adenopathy.       Right: No supraclavicular adenopathy present.       Left: No supraclavicular adenopathy present.  Neurological: He is alert and oriented to person, place, and time.  Skin: Skin is warm and dry. No rash noted. He is not diaphoretic. No erythema. No pallor.  Psychiatric: He has a normal mood and affect. His behavior is normal. Judgment and thought content normal.  Nursing note and vitals reviewed.   Appointment on 10/25/2018  Component  Date Value Ref Range Status  . WBC 10/25/2018 4.8  4.0 - 10.5 K/uL Final  . RBC 10/25/2018 3.11* 4.22 - 5.81 MIL/uL Final  . Hemoglobin 10/25/2018 9.1* 13.0 - 17.0 g/dL Final  . HCT 96/04/5407 28.3* 39.0 - 52.0 % Final  . MCV 10/25/2018 91.0  80.0 - 100.0 fL Final  . MCH 10/25/2018 29.3  26.0 - 34.0 pg Final  . MCHC 10/25/2018 32.2  30.0 - 36.0 g/dL Final  . RDW 81/19/1478 17.7* 11.5 - 15.5 % Final  . Platelets 10/25/2018 201  150 - 400 K/uL Final  . nRBC 10/25/2018 0.0  0.0 - 0.2 % Final  . Neutrophils Relative % 10/25/2018 60  % Final  . Neutro Abs 10/25/2018 2.9  1.7 - 7.7 K/uL Final  . Lymphocytes Relative 10/25/2018 25  % Final  . Lymphs Abs 10/25/2018 1.2  0.7 - 4.0 K/uL Final  . Monocytes Relative 10/25/2018 13  % Final  . Monocytes Absolute 10/25/2018 0.6  0.1 - 1.0 K/uL Final  . Eosinophils Relative 10/25/2018 1  % Final  . Eosinophils Absolute 10/25/2018 0.1  0.0 - 0.5 K/uL Final  . Basophils Relative 10/25/2018 1  % Final  . Basophils Absolute 10/25/2018 0.1  0.0 - 0.1 K/uL Final  . Immature Granulocytes 10/25/2018 0  % Final  . Abs Immature Granulocytes 10/25/2018 0.02  0.00 - 0.07 K/uL Final   Performed at [facility], [address] [place], [place] [address]    Assessment:  George Gomez is a 58 y.o. male with mild pancytopenia. CBCs from 07/31/2013 - 09/03/2017 reveal a hemoglobinfrom 10.1 - 14.7 without trend. MCV has been normal. Plateletshave ranged between 111,000 - 190,000. WBChas ranged between 2000 - 8300. WBC has been consistently low since 06/01/2017. He drinks alcoholdaily.  CBC on 08/08/2019revealed a hematocrit of 35.4, hemoglobin 11.5, MCV 94.5, platelets 149,000, WBC 2000 with an ANC of 1000. Differential included 49% segs, 34% lymphocytes, 14% monocytes, 2% eosinophils, and 1% basophils. Outside labsnote negative/normal peripheral smear, LDH, and SPEP/UPEP.  Work-up on 11/07/2019revealed a hematocrit of 29.6, hemoglobin 9.3,  MCV 97.7, WBC 2600 with an ANC of 1200. Differential included 48% segs, 36% lymphs, 14% monocytes, 1% eosinophils, and 1% basophils. . B12was 332 (low normal). Normal studiesincluded: Creatinine (1.05), LFTs, ferritin (257), iron studies, folate (7.4), TSH, copper, zinc, ANA, hepatitis B core antibody, hepatitis B surface antigen, hepatitis C antibody, and HIV testing. Retic was 2.2%. Peripheral smearrevealed no abnormalities. Flow cytometry on 01/23/2019 was negative.  He has B12 deficiencyandfolate deficiency. He began B12 injections on  12/11/2017 (last 10/14/2018). Folatewas 4.1 on 07/13/2018.  Abdomen and pelvic CTon 05/30/2017 revealed hepatic steatosis. Spleen was normal.  He has a history of a GI bleedon 05/30/2017. EGDon 06/01/2017 revealed a normal esophagus, normal stomach, and duodenitis. Biopsy revealed reactive duodenitis without dysplasia or malignancy.  Colonoscopy on 10/16/2018 revealed diverticulosis in the entire examined colon. The distal rectum and anal verge were normal on retroflexion view.  There was normal mucosa in the entire examined colon.  Small bowel enteroscopy on 10/16/2018 was entirely normal (esophagus, stomach, duodenum, proximal and mid-jejunum).  Capsule endoscopy was sub-optimal.  [facility].EGDon 16/10/9602 revealedalcoholic gastritis but no evidence of acute bleeding. Stools were heme positive. Herequireda PRBCtransfusion. Hemoglobinrangedbetween 8.4 - 8.7. He was discharged with PPI twice daily.  He was admitted to [facility] from 10/12/2018 - 10/13/2018 with nausea, vomiting and diarrhea.  He had been drinking alcohol.  Creatinine was 2.61 (baseline 0.83).  He was dehydrated.  He received IV fluids and electrolyte replacement.  Creatinine was 1.77 on 10/13/2018.  Hemoglobin was 8.5.  He was admitted to [facility] from 10/14/2018 - 10/17/2018 with symptomatic anemia.  He underwent  small bowel enteroscopy and colonoscopy.  He received 2 units of PRBCs and Venofer (400 mg).  Symptomatically, he feels "ok".  He stopped drinking alcohol on 10/13/2018.  Plan: 1.   Labs today: CBC with diff, hold tube. 2.Normocytic anemia Hematocrit28.3. Hemoglobin9.1MCV 91. He has a history of a GI bleed secondary to alcoholic gastritis with associated melena.  Review interval hospitalization and GI studies.   EGD and colonoscopy revealed no source of GI bleeding.   Capsule endoscopy was sub-optimal. Encourage alcohol cessation. 3.  B12 deficiency B12 was 593 on 07/01/2018. Patient received B12 on 10/14/2018.             Continue B12 monthly.  Check folate periodically. 4. Upper GI bleed Patient has a history of GI bleed in 05/2017 and 06/2018. Review interval GI evaluation.  Dr Marius Ditch plans to repeat capsule study. 5.Mild leukopenia, resolved WBC 4800 with an ANC of 2900.             Etiology secondary to alcohol. Continue to monitor. 6. Renal insufficiency Creatinine 2.61 on 10/12/2018 with dehydration. Creatinine 0.88  On 10/15/2018.             Creatinine has fluctuated between 0.98 - 1.78 in the past year.             Continue to monitor. 7. Weight loss Patient's baseline weight 216-217 pounds.  Etiology felt secondary to alcohol replacing calories.  Weight up 2 pounds since last visit.             Continue to monitor. 8.  RTC in 2 weeks for labs (CBC, hold tube). 9.  RTC in 4 weeks for MD assessment and labs (CBC with diff, BMP, ferritin, iron studies, sed rate, retic count).  I discussed the assessment and treatment plan with the patient.  The patient was provided an opportunity to ask questions and all were answered.  The patient agreed with the plan and demonstrated an understanding of the instructions.  The  patient was advised to call back if the symptoms worsen or if the condition fails to improve as anticipated.   Lequita Asal, MD, PhD    10/25/2018, 2:19 PM  I, Samul Dada, am acting as scribe for [employer]. Mike Gip, MD, PhD.  I, Melissa C. Mike Gip, MD, have reviewed the  above documentation for accuracy and completeness, and I agree with the above.

## 2018-10-25 NOTE — Progress Notes (Signed)
No new changes noted today 

## 2018-10-26 ENCOUNTER — Encounter: Payer: Self-pay | Admitting: Gastroenterology

## 2018-11-02 ENCOUNTER — Inpatient Hospital Stay: Payer: Medicare Other | Attending: Hematology and Oncology

## 2018-11-02 ENCOUNTER — Other Ambulatory Visit: Payer: Self-pay

## 2018-11-02 VITALS — BP 151/77 | HR 67 | Temp 98.8°F | Resp 16

## 2018-11-02 DIAGNOSIS — Z833 Family history of diabetes mellitus: Secondary | ICD-10-CM | POA: Diagnosis not present

## 2018-11-02 DIAGNOSIS — E7800 Pure hypercholesterolemia, unspecified: Secondary | ICD-10-CM | POA: Diagnosis not present

## 2018-11-02 DIAGNOSIS — E119 Type 2 diabetes mellitus without complications: Secondary | ICD-10-CM | POA: Insufficient documentation

## 2018-11-02 DIAGNOSIS — K2920 Alcoholic gastritis without bleeding: Secondary | ICD-10-CM | POA: Diagnosis not present

## 2018-11-02 DIAGNOSIS — N289 Disorder of kidney and ureter, unspecified: Secondary | ICD-10-CM | POA: Insufficient documentation

## 2018-11-02 DIAGNOSIS — E860 Dehydration: Secondary | ICD-10-CM | POA: Diagnosis not present

## 2018-11-02 DIAGNOSIS — E538 Deficiency of other specified B group vitamins: Secondary | ICD-10-CM | POA: Diagnosis present

## 2018-11-02 DIAGNOSIS — I10 Essential (primary) hypertension: Secondary | ICD-10-CM | POA: Insufficient documentation

## 2018-11-02 DIAGNOSIS — D61818 Other pancytopenia: Secondary | ICD-10-CM | POA: Insufficient documentation

## 2018-11-02 DIAGNOSIS — E785 Hyperlipidemia, unspecified: Secondary | ICD-10-CM | POA: Insufficient documentation

## 2018-11-02 DIAGNOSIS — D649 Anemia, unspecified: Secondary | ICD-10-CM | POA: Diagnosis not present

## 2018-11-02 DIAGNOSIS — Z8249 Family history of ischemic heart disease and other diseases of the circulatory system: Secondary | ICD-10-CM | POA: Insufficient documentation

## 2018-11-02 DIAGNOSIS — Z79899 Other long term (current) drug therapy: Secondary | ICD-10-CM | POA: Diagnosis not present

## 2018-11-02 MED ORDER — CYANOCOBALAMIN 1000 MCG/ML IJ SOLN
1000.0000 ug | Freq: Once | INTRAMUSCULAR | Status: AC
  Administered 2018-11-02: 16:00:00 1000 ug via INTRAMUSCULAR

## 2018-11-05 ENCOUNTER — Other Ambulatory Visit: Payer: Self-pay

## 2018-11-08 ENCOUNTER — Other Ambulatory Visit: Payer: Self-pay

## 2018-11-08 ENCOUNTER — Inpatient Hospital Stay: Payer: Medicare Other

## 2018-11-08 DIAGNOSIS — D649 Anemia, unspecified: Secondary | ICD-10-CM

## 2018-11-08 DIAGNOSIS — D696 Thrombocytopenia, unspecified: Secondary | ICD-10-CM

## 2018-11-08 DIAGNOSIS — E538 Deficiency of other specified B group vitamins: Secondary | ICD-10-CM

## 2018-11-08 LAB — CBC
HCT: 31.8 % — ABNORMAL LOW (ref 39.0–52.0)
Hemoglobin: 10.2 g/dL — ABNORMAL LOW (ref 13.0–17.0)
MCH: 29.2 pg (ref 26.0–34.0)
MCHC: 32.1 g/dL (ref 30.0–36.0)
MCV: 91.1 fL (ref 80.0–100.0)
Platelets: 212 10*3/uL (ref 150–400)
RBC: 3.49 MIL/uL — ABNORMAL LOW (ref 4.22–5.81)
RDW: 16.6 % — ABNORMAL HIGH (ref 11.5–15.5)
WBC: 6.2 10*3/uL (ref 4.0–10.5)
nRBC: 0 % (ref 0.0–0.2)

## 2018-11-08 LAB — SAMPLE TO BLOOD BANK

## 2018-11-20 NOTE — Progress Notes (Signed)
[facility]  [address] [place], [place] [address] Phone: [phone]  Fax: [phone]   Clinic Day:  11/22/2018  Referring physician: [facility]  Chief Complaint: George Gomez is a 58 y.o. male with a normocyticanemia and B12 deficiency who is seen for 1 month assessement  HPI: The patient was last seen in the hematology clinic on 10/25/2018. At that time,  he felt "ok".  He had stopped drinking alcohol on 10/13/2018.  Hematocrit was 28.3, hemoglobin 9.1, platelets 201,000, WBC 4800 with an ANC 2900.  He received a B12 injection on 11/02/2018.  Labs on 11/08/2018 showed a hematocrit 31.8, hemoglobin 10.2, MCV 91.1, platelets 212,000, WBC 6200.   During the interim, he describes having a little alcohol.  He had a single toast at wedding for a friend's daughter over the weekend. He denies getting drunk. He denies any bleeding, bloody or tarry stools. He denies any cough.  He denies any excess bruising.  Symptomatically he feels "fine" today.  His diabetes is doing fine. He has no changes in his diet but note; he eats fruits and vegetables. He mainly drink Gatorade; he notes he should drink more water.   Past Medical History:  Diagnosis Date  . Anemia   . Diabetes mellitus without complication (HCC)   . Diverticulitis    Pt states diverticulitis  . EtOH dependence (HCC)   . Hypercholesteremia   . Hypertension     Past Surgical History:  Procedure Laterality Date  . COLONOSCOPY WITH PROPOFOL N/A 10/16/2018   Procedure: COLONOSCOPY WITH PROPOFOL;  Surgeon: Toney Reil, MD;  Location[facility] ENDOSCOPY;  Service: Gastroenterology;  Laterality: N/A;  . ENTEROSCOPY N/A 10/16/2018   Procedure: ENTEROSCOPY;  Surgeon: Toney Reil, MD;  Location: [facility] ENDOSCOPY;  Service: Gastroenterology;  Laterality: N/A;  . ESOPHAGOGASTRODUODENOSCOPY N/A 07/02/2018   Procedure: ESOPHAGOGASTRODUODENOSCOPY (EGD);  Surgeon: Toney Reil, MD;  Location: [facility] ENDOSCOPY;  Service: Gastroenterology;  Laterality: N/A;  . ESOPHAGOGASTRODUODENOSCOPY (EGD) WITH PROPOFOL N/A 06/01/2017   Procedure: ESOPHAGOGASTRODUODENOSCOPY (EGD) WITH PROPOFOL;  Surgeon: Wyline Mood, MD;  Location: [facility] ENDOSCOPY;  Service: Gastroenterology;  Laterality: N/A;  . GIVENS CAPSULE STUDY N/A 10/17/2018   Procedure: GIVENS CAPSULE STUDY;  Surgeon: Toney Reil, MD;  Location: [facility] ENDOSCOPY;  Service: Gastroenterology;  Laterality: N/A;  . none      Family History  Problem Relation Age of Onset  . Rheum arthritis Mother   . Diabetes Father   . Heart disease Father   . Cancer Father   . Cancer Maternal Grandfather   . Cancer Paternal Grandfather     Social History:  reports that he has never smoked. He has never used smokeless tobacco. He reports current alcohol use of about 6.0 standard drinks of alcohol per week. He reports that he does not use drugs. He drank liquor 2-3 x/day. He is drinking less than he did in the past (moonshine), about 2 shots daily. He did "crack" 10-15 years ago. He lives in [place]. Is no longer socially drinking, He says that if can overcome doing crack he can overcome drinking alcohol. The patient is alone today.  Allergies: No Known Allergies  Current Medications: Current Outpatient Medications  Medication Sig Dispense Refill  . allopurinol (ZYLOPRIM) 300 MG tablet Take 300 mg by mouth daily.     Marland Kitchen amLODipine (NORVASC) 5 MG tablet Take 5 mg by mouth daily.     Marland Kitchen atorvastatin (LIPITOR) 40 MG tablet Take 40 mg  by mouth daily.     Marland Kitchen gabapentin (NEURONTIN) 300 MG capsule Take 300 mg by mouth 3 (three) times daily.   3  . losartan (COZAAR) 50 MG tablet Take 1 tablet (50 mg total) by mouth daily. 30 tablet 1  . Multiple Vitamin (MULTIVITAMIN WITH MINERALS) TABS tablet Take 1 tablet by mouth daily. 30 tablet 0  . naltrexone (DEPADE) 50 MG tablet Take 50 mg by mouth daily.     . pantoprazole (PROTONIX) 40  MG tablet Take 1 tablet (40 mg total) by mouth daily. 30 tablet 1  . propranolol ER (INDERAL LA) 60 MG 24 hr capsule Take 60 mg by mouth daily.      No current facility-administered medications for this visit.     Review of Systems  Constitutional: Negative for chills, fever, malaise/fatigue and weight loss (up 5 pounds).       Feels "fine".  HENT: Negative.  Negative for congestion, ear pain, hearing loss, nosebleeds, sinus pain and sore throat.   Eyes: Negative.  Negative for blurred vision and double vision.  Respiratory: Negative.  Negative for cough, hemoptysis and shortness of breath.   Cardiovascular: Negative.  Negative for chest pain, palpitations and orthopnea.  Gastrointestinal: Negative for abdominal pain, blood in stool, constipation, diarrhea, heartburn, melena, nausea and vomiting.       Eating well.  Genitourinary: Negative for dysuria, frequency and urgency.       H/o kidney stones.  Musculoskeletal: Negative.  Negative for back pain, falls, joint pain and myalgias.  Skin: Negative.  Negative for itching and rash.  Neurological: Negative.  Negative for dizziness, tingling, sensory change, speech change, focal weakness, weakness and headaches.  Endo/Heme/Allergies: Does not bruise/bleed easily.       Diabetes is "ok".  Psychiatric/Behavioral: Negative.  Negative for depression and memory loss. The patient is not nervous/anxious and does not have insomnia.   All other systems reviewed and are negative.  Performance status (ECOG): 0  Vitals Blood pressure 117/69, pulse 74, temperature 98 F (36.7 C), temperature source Tympanic, resp. rate 18, height 5\' 11"  (1.803 m), weight 214 lb 13.4 oz (97.4 kg), SpO2 100 %.   Physical Exam  Constitutional: He is oriented to person, place, and time. He appears well-developed and well-nourished. No distress. Face mask in place.  HENT:  Head: Normocephalic and atraumatic.  Mouth/Throat: Oropharynx is clear and moist. No  oropharyngeal exudate.  Graying hair.  Mask.  Eyes: Pupils are equal, round, and reactive to light. Conjunctivae and EOM are normal. No scleral icterus.  Brown eyes.   Neck: Normal range of motion. Neck supple. No JVD present.  Cardiovascular: Normal rate, regular rhythm and normal heart sounds.  No murmur heard. Pulmonary/Chest: Effort normal and breath sounds normal. No respiratory distress. He has no wheezes. He has no rales. He exhibits no tenderness.  Abdominal: Soft. Bowel sounds are normal. He exhibits no distension and no mass. There is no abdominal tenderness. There is no rebound and no guarding.  Musculoskeletal: Normal range of motion.        General: No tenderness or edema.  Lymphadenopathy:    He has no cervical adenopathy.    He has no axillary adenopathy.       Right: No supraclavicular adenopathy present.       Left: No supraclavicular adenopathy present.  Neurological: He is alert and oriented to person, place, and time.  Skin: Skin is warm and dry. No rash noted. He is not diaphoretic. No erythema. No  pallor.  Psychiatric: He has a normal mood and affect. His behavior is normal. Judgment and thought content normal.  Nursing note and vitals reviewed.   Appointment on 11/22/2018  Component Date Value Ref Range Status  . Sed Rate 11/22/2018 67* 0 - 20 mm/hr Final   Performed at [facility], [address] [place], [place] [address]  . Retic Ct Pct 11/22/2018 1.3  0.4 - 3.1 % Final  . RBC. 11/22/2018 3.55* 4.22 - 5.81 MIL/uL Final  . Retic Count, Absolute 11/22/2018 46.5  19.0 - 186.0 K/uL Final  . Immature Retic Fract 11/22/2018 17.2* 2.3 - 15.9 % Final   Performed at [facility], [address] [place], [place] [address]  . Sodium 11/22/2018 137  135 - 145 mmol/L Final  . Potassium 11/22/2018 3.8  3.5 - 5.1 mmol/L Final  . Chloride 11/22/2018 106  98 - 111 mmol/L Final  . CO2 11/22/2018 21* 22 - 32 mmol/L Final  . Glucose, Bld 11/22/2018  135* 70 - 99 mg/dL Final  . BUN 09/81/191410/26/2020 27* 6 - 20 mg/dL Final  . Creatinine, Ser 11/22/2018 1.63* 0.61 - 1.24 mg/dL Final  . Calcium 78/29/562110/26/2020 9.6  8.9 - 10.3 mg/dL Final  . GFR calc non Af Amer 11/22/2018 45* >60 mL/min Final  . GFR calc Af Amer 11/22/2018 52* >60 mL/min Final  . Anion gap 11/22/2018 10  5 - 15 Final   Performed at [facility], [address] [place], [place] [address]  . Blood Bank Specimen 11/22/2018 SAMPLE AVAILABLE FOR TESTING   Final  . Sample Expiration 11/22/2018    Final                   Value:11/25/2018,2359 Performed at [facility], [address] [place], [place] [address]     Assessment:  George Gomez is a 58 y.o. male with mild pancytopenia. CBCs from 07/31/2013 - 09/03/2017 reveal a hemoglobinfrom 10.1 - 14.7 without trend. MCV has been normal. Plateletshave ranged between 111,000 - 190,000. WBChas ranged between 2000 - 8300. WBC has been consistently low since 06/01/2017. He drinks alcoholdaily.  CBC on 08/08/2019revealed a hematocrit of 35.4, hemoglobin 11.5, MCV 94.5, platelets 149,000, WBC 2000 with an ANC of 1000. Differential included 49% segs, 34% lymphocytes, 14% monocytes, 2% eosinophils, and 1% basophils. Outside labsnote negative/normal peripheral smear, LDH, and SPEP/UPEP.  Work-up on 11/07/2019revealed a hematocrit of 29.6, hemoglobin 9.3, MCV 97.7, WBC 2600 with an ANC of 1200. Differential included 48% segs, 36% lymphs, 14% monocytes, 1% eosinophils, and 1% basophils. . B12was 332 (low normal). Normal studiesincluded: Creatinine (1.05), LFTs, ferritin (257), iron studies, folate (7.4), TSH, copper, zinc, ANA, hepatitis B core antibody, hepatitis B surface antigen, hepatitis C antibody, and HIV testing. Retic was 2.2%. Peripheral smearrevealed no abnormalities. Flow cytometry on 01/23/2019 was negative.  He has B12 deficiencyandfolate deficiency. He began B12 injections on 12/11/2017  (last 10/14/2018). Folatewas 4.1 on 07/13/2018.  Abdomen and pelvic CTon 05/30/2017 revealed hepatic steatosis. Spleen was normal.  He has a history of a GI bleedon 05/30/2017. EGDon 06/01/2017 revealed a normal esophagus, normal stomach, and duodenitis. Biopsy revealed reactive duodenitis without dysplasia or malignancy.  Colonoscopy on 10/16/2018 revealed diverticulosis in the entire examined colon. The distal rectum and anal verge were normal on retroflexion view.  There was normal mucosa in the entire examined colon.  Small bowel enteroscopy on 10/16/2018 was entirely normal (esophagus, stomach, duodenum, proximal and mid-jejunum).  Capsule endoscopy was  sub-optimal.  [facility].EGDon 07/02/2018 revealedalcoholic gastritis but no evidence of acute bleeding. Stools were heme positive. Herequireda PRBCtransfusion. Hemoglobinrangedbetween 8.4 - 8.7. He was discharged with PPI twice daily.  [facility] with nausea, vomiting and diarrhea.He had been drinking alcohol. Creatininewas 2.61 (baseline 0.83). He was dehydrated. He received IV fluids and electrolyte replacement. Creatinine was 1.77 on09/16/2020.Hemoglobin was 8.5.  [facility] with symptomatic anemia.  He underwent small bowel enteroscopy and colonoscopy.  He received 2 units of PRBCs and Venofer (400 mg).  Symptomatically, he feels "fine".  He denies any excess bruising or bleeding.  Plan: 1.   Labs today: CBC with diff, BMP, ferritin, iron studies, sed rate, retic count. 2.Normocytic anemia Hematocrit31.8. Hemoglobin10.2MCV91.1 on 11/08/2018. Hehas a history of a GI bleed secondary to alcoholic gastritis with associated melena.             EGD and colonoscopy in 09/2018 revealed no source of GI bleeding.  Capsule endoscopy was  sub-optimal. He had a little alcohol at a recent wedding.  Encourage to remain off alcohol. 3.  B12 deficiency B12 was 593 on 07/01/2018. Patient received B12 on 11/02/2018. Continue B12 monthly.             Check folate periodically (last 07/2018). 4. Upper GI bleed Patient has a history of GI bleed in 05/2017 and 06/2018. GI  Studies as noted above.             Review Dr Allegra Lai plans to repeat capsule study. 5. Renal insufficiency Creatinine2.61 on 10/12/2018 with dehydration. Creatinine 0.88  on 10/15/2018.  Creatinine 1.63 on 10/276/2020. Creatinine has fluctuated between 0.98 - 1.78 in the past year. RN to contact [facility] (Dr Orvan Falconer) re:  Creatinine.  Continue to monitor. 6. Weight loss, resolved Patient's baselineweight 216-217 pounds.             Weight up 5 pounds since last visit. Continue to monitor. 7.   RTC on 11/30/2018 for B12 then monthly x 4. 8.   RTC in 4 weeks for labs (CBC, holdtube). 9.   RTC in 8 weeks for MD assessment and labs (CBC with diff, BMP, ferritin, iron studies).  I discussed the assessment and treatment plan with the patient.  The patient was provided an opportunity to ask questions and all were answered.  The patient agreed with the plan and demonstrated an understanding of the instructions.  The patient was advised to call back if the symptoms worsen or if the condition fails to improve as anticipated.  I provided 19 minutes (2:05 PM - 2:24 PM) of face-to-face time during this this encounter and > 50% was spent counseling as documented under my assessment and plan.    Rosey Bath, MD, PhD    11/22/2018, 4:19 PM  I, Arnette Norris, am acting as a scribe for Rosey Bath, MD.  I,  C. Merlene Pulling, MD, have reviewed the above documentation for accuracy and  completeness, and I agree with the above.

## 2018-11-22 ENCOUNTER — Inpatient Hospital Stay: Payer: Medicare Other

## 2018-11-22 ENCOUNTER — Other Ambulatory Visit: Payer: Self-pay

## 2018-11-22 ENCOUNTER — Inpatient Hospital Stay (HOSPITAL_BASED_OUTPATIENT_CLINIC_OR_DEPARTMENT_OTHER): Payer: Medicare Other | Admitting: Hematology and Oncology

## 2018-11-22 ENCOUNTER — Encounter: Payer: Self-pay | Admitting: Hematology and Oncology

## 2018-11-22 VITALS — BP 117/69 | HR 74 | Temp 98.0°F | Resp 18 | Ht 71.0 in | Wt 214.8 lb

## 2018-11-22 DIAGNOSIS — F1010 Alcohol abuse, uncomplicated: Secondary | ICD-10-CM

## 2018-11-22 DIAGNOSIS — D649 Anemia, unspecified: Secondary | ICD-10-CM

## 2018-11-22 DIAGNOSIS — E538 Deficiency of other specified B group vitamins: Secondary | ICD-10-CM

## 2018-11-22 DIAGNOSIS — D696 Thrombocytopenia, unspecified: Secondary | ICD-10-CM

## 2018-11-22 LAB — BASIC METABOLIC PANEL
Anion gap: 10 (ref 5–15)
BUN: 27 mg/dL — ABNORMAL HIGH (ref 6–20)
CO2: 21 mmol/L — ABNORMAL LOW (ref 22–32)
Calcium: 9.6 mg/dL (ref 8.9–10.3)
Chloride: 106 mmol/L (ref 98–111)
Creatinine, Ser: 1.63 mg/dL — ABNORMAL HIGH (ref 0.61–1.24)
GFR calc Af Amer: 52 mL/min — ABNORMAL LOW (ref >60)
GFR calc non Af Amer: 45 mL/min — ABNORMAL LOW (ref >60)
Glucose, Bld: 135 mg/dL — ABNORMAL HIGH (ref 70–99)
Potassium: 3.8 mmol/L (ref 3.5–5.1)
Sodium: 137 mmol/L (ref 135–145)

## 2018-11-22 LAB — RETICULOCYTES
Immature Retic Fract: 17.2 % — ABNORMAL HIGH (ref 2.3–15.9)
RBC.: 3.55 MIL/uL — ABNORMAL LOW (ref 4.22–5.81)
Retic Count, Absolute: 46.5 10*3/uL (ref 19.0–186.0)
Retic Ct Pct: 1.3 % (ref 0.4–3.1)

## 2018-11-22 LAB — SAMPLE TO BLOOD BANK

## 2018-11-22 LAB — SEDIMENTATION RATE: Sed Rate: 67 mm/hr — ABNORMAL HIGH (ref 0–20)

## 2018-11-22 NOTE — Progress Notes (Signed)
No new changes noted today 

## 2018-11-23 LAB — IRON AND TIBC
Iron: 93 ug/dL (ref 45–182)
Saturation Ratios: 34 % (ref 17.9–39.5)
TIBC: 274 ug/dL (ref 250–450)
UIBC: 181 ug/dL

## 2018-11-23 LAB — FERRITIN: Ferritin: 364 ng/mL — ABNORMAL HIGH (ref 24–336)

## 2018-11-29 ENCOUNTER — Other Ambulatory Visit: Payer: Self-pay

## 2018-11-30 ENCOUNTER — Inpatient Hospital Stay: Payer: Medicare Other | Attending: Hematology and Oncology

## 2018-11-30 VITALS — BP 162/88 | HR 94 | Temp 99.3°F | Resp 18

## 2018-11-30 DIAGNOSIS — D649 Anemia, unspecified: Secondary | ICD-10-CM | POA: Insufficient documentation

## 2018-11-30 DIAGNOSIS — E538 Deficiency of other specified B group vitamins: Secondary | ICD-10-CM | POA: Diagnosis not present

## 2018-11-30 MED ORDER — CYANOCOBALAMIN 1000 MCG/ML IJ SOLN
1000.0000 ug | Freq: Once | INTRAMUSCULAR | Status: AC
  Administered 2018-11-30: 1000 ug via INTRAMUSCULAR

## 2018-11-30 NOTE — Patient Instructions (Signed)

## 2018-12-17 ENCOUNTER — Other Ambulatory Visit: Payer: Self-pay

## 2018-12-20 ENCOUNTER — Other Ambulatory Visit: Payer: Self-pay

## 2018-12-20 ENCOUNTER — Inpatient Hospital Stay: Payer: Medicare Other

## 2018-12-20 DIAGNOSIS — E538 Deficiency of other specified B group vitamins: Secondary | ICD-10-CM | POA: Diagnosis not present

## 2018-12-20 DIAGNOSIS — D649 Anemia, unspecified: Secondary | ICD-10-CM

## 2018-12-20 LAB — CBC WITH DIFFERENTIAL/PLATELET
Abs Immature Granulocytes: 0.01 10*3/uL (ref 0.00–0.07)
Basophils Absolute: 0 10*3/uL (ref 0.0–0.1)
Basophils Relative: 1 %
Eosinophils Absolute: 0.1 10*3/uL (ref 0.0–0.5)
Eosinophils Relative: 2 %
HCT: 33.1 % — ABNORMAL LOW (ref 39.0–52.0)
Hemoglobin: 10.5 g/dL — ABNORMAL LOW (ref 13.0–17.0)
Immature Granulocytes: 0 %
Lymphocytes Relative: 38 %
Lymphs Abs: 1.2 10*3/uL (ref 0.7–4.0)
MCH: 28.8 pg (ref 26.0–34.0)
MCHC: 31.7 g/dL (ref 30.0–36.0)
MCV: 90.7 fL (ref 80.0–100.0)
Monocytes Absolute: 0.4 10*3/uL (ref 0.1–1.0)
Monocytes Relative: 12 %
Neutro Abs: 1.5 10*3/uL — ABNORMAL LOW (ref 1.7–7.7)
Neutrophils Relative %: 47 %
Platelets: 170 10*3/uL (ref 150–400)
RBC: 3.65 MIL/uL — ABNORMAL LOW (ref 4.22–5.81)
RDW: 15.8 % — ABNORMAL HIGH (ref 11.5–15.5)
WBC: 3.2 10*3/uL — ABNORMAL LOW (ref 4.0–10.5)
nRBC: 0 % (ref 0.0–0.2)

## 2018-12-20 LAB — SAMPLE TO BLOOD BANK

## 2018-12-27 ENCOUNTER — Other Ambulatory Visit: Payer: Self-pay

## 2018-12-28 ENCOUNTER — Inpatient Hospital Stay: Payer: Medicare Other | Attending: Hematology and Oncology

## 2018-12-28 VITALS — BP 131/68 | HR 81 | Temp 96.6°F | Resp 18

## 2018-12-28 DIAGNOSIS — F1020 Alcohol dependence, uncomplicated: Secondary | ICD-10-CM | POA: Diagnosis not present

## 2018-12-28 DIAGNOSIS — M25569 Pain in unspecified knee: Secondary | ICD-10-CM | POA: Diagnosis not present

## 2018-12-28 DIAGNOSIS — E119 Type 2 diabetes mellitus without complications: Secondary | ICD-10-CM | POA: Insufficient documentation

## 2018-12-28 DIAGNOSIS — Z8261 Family history of arthritis: Secondary | ICD-10-CM | POA: Insufficient documentation

## 2018-12-28 DIAGNOSIS — Z8249 Family history of ischemic heart disease and other diseases of the circulatory system: Secondary | ICD-10-CM | POA: Insufficient documentation

## 2018-12-28 DIAGNOSIS — N289 Disorder of kidney and ureter, unspecified: Secondary | ICD-10-CM | POA: Insufficient documentation

## 2018-12-28 DIAGNOSIS — E785 Hyperlipidemia, unspecified: Secondary | ICD-10-CM | POA: Insufficient documentation

## 2018-12-28 DIAGNOSIS — E538 Deficiency of other specified B group vitamins: Secondary | ICD-10-CM | POA: Diagnosis present

## 2018-12-28 DIAGNOSIS — D61818 Other pancytopenia: Secondary | ICD-10-CM | POA: Diagnosis present

## 2018-12-28 DIAGNOSIS — Z833 Family history of diabetes mellitus: Secondary | ICD-10-CM | POA: Diagnosis not present

## 2018-12-28 DIAGNOSIS — Z79899 Other long term (current) drug therapy: Secondary | ICD-10-CM | POA: Insufficient documentation

## 2018-12-28 DIAGNOSIS — E7800 Pure hypercholesterolemia, unspecified: Secondary | ICD-10-CM | POA: Diagnosis not present

## 2018-12-28 DIAGNOSIS — G8929 Other chronic pain: Secondary | ICD-10-CM | POA: Diagnosis not present

## 2018-12-28 DIAGNOSIS — M25551 Pain in right hip: Secondary | ICD-10-CM | POA: Insufficient documentation

## 2018-12-28 DIAGNOSIS — I10 Essential (primary) hypertension: Secondary | ICD-10-CM | POA: Diagnosis not present

## 2018-12-28 MED ORDER — CYANOCOBALAMIN 1000 MCG/ML IJ SOLN
1000.0000 ug | Freq: Once | INTRAMUSCULAR | Status: AC
  Administered 2018-12-28: 1000 ug via INTRAMUSCULAR
  Filled 2018-12-28: qty 1

## 2019-01-07 ENCOUNTER — Other Ambulatory Visit: Payer: Self-pay

## 2019-01-07 DIAGNOSIS — Z20822 Contact with and (suspected) exposure to covid-19: Secondary | ICD-10-CM

## 2019-01-09 LAB — NOVEL CORONAVIRUS, NAA: SARS-CoV-2, NAA: NOT DETECTED

## 2019-01-10 ENCOUNTER — Telehealth: Payer: Self-pay | Admitting: General Practice

## 2019-01-10 NOTE — Telephone Encounter (Signed)
Negative COVID results given. Patient results "NOT Detected." Caller expressed understanding. ° °

## 2019-01-14 NOTE — Progress Notes (Signed)
George Gomez  [address], [address] [place], [place] [address] Phone: [phone]  Fax: [phone]   Clinic Day:  01/20/2019  Referring physician: [facility]  Chief Complaint: George Gomez is a 58 y.o. male with normocyticanemia and B12 deficiency who is seen for 2 month assessment.   HPI: The patient was last seen in the hematology clinic on 11/22/2018. At that time, he felt "fine". He denied any excess bruising or bleeding. Ferritin was 364 with an iron saturation of 34% and a TIBC 274.  Creatinine was 1.63.  Retic was 1.3%.  Sed rate was 67%.  He received B12 injections on 11/30/2018 and 12/28/2018  CBC followed: 12/20/2018: Hematocrit 33.1, hemoglobin 10.5, MCV 90.7, platelets 170,000, WBC 3200, ANC 1500.  During the interim, he has been feeling "great".  Specifically, he has been doing great aside from his chronic right hip pain. He has no complaints. He can tell the difference from when he drinks and when he doesn't drink alcohol.  He will drink 2 shots in 3 days.  He states that no one checks his diabetes unless he is the hospital. He would like a glucose monitor but is told he doesn't need one. When he gets his blood sugar checked in the hospital his results are normal.  He notes more edema on his right leg than the left. He fell last week on his couch. He had been having right leg and knee pain (feels like an "ache").  He didn't hit his head.    Past Medical History:  Diagnosis Date  . Anemia   . Diabetes mellitus without complication (HCC)   . Diverticulitis    Pt states diverticulitis  . EtOH dependence (HCC)   . Hypercholesteremia   . Hypertension     Past Surgical History:  Procedure Laterality Date  . COLONOSCOPY WITH PROPOFOL N/A 10/16/2018   Procedure: COLONOSCOPY WITH PROPOFOL;  Surgeon: Toney ReilVanga, Rohini Reddy, MD;  [facility];  Service: Gastroenterology;  Laterality: N/A;  . ENTEROSCOPY N/A 10/16/2018   Procedure: ENTEROSCOPY;  Surgeon: Toney ReilVanga, Rohini Reddy, MD;  [facility];  Service: Gastroenterology;  Laterality: N/A;  . ESOPHAGOGASTRODUODENOSCOPY N/A 07/02/2018   Procedure: ESOPHAGOGASTRODUODENOSCOPY (EGD);  Surgeon: Toney ReilVanga, Rohini Reddy, MD;  [facility];  Service: Gastroenterology;  Laterality: N/A;  . ESOPHAGOGASTRODUODENOSCOPY (EGD) WITH PROPOFOL N/A 06/01/2017   Procedure: ESOPHAGOGASTRODUODENOSCOPY (EGD) WITH PROPOFOL;  Surgeon: Wyline MoodAnna, Kiran, MD;  [facility];  Service: Gastroenterology;  Laterality: N/A;  . GIVENS CAPSULE STUDY N/A 10/17/2018   Procedure: GIVENS CAPSULE STUDY;  Surgeon: Toney ReilVanga, Rohini Reddy, MD;  [facility];  Service: Gastroenterology;  Laterality: N/A;  . none      Family History  Problem Relation Age of Onset  . Rheum arthritis Mother   . Diabetes Father   . Heart disease Father   . Cancer Father   . Cancer Maternal Grandfather   . Cancer Paternal Grandfather     Social History:  reports that he has never smoked. He has never used smokeless tobacco. He reports current alcohol use of about 6.0 standard drinks of alcohol per week. He reports that he does not use drugs.  He drankliquor 2-3 x/day. He is drinking less than he did in the past (moonshine), about 2-3 shots daily. He did "crack" 10-15 years ago. [place] in [place]. Is no longer socially drinking, He says that if can overcome doing crack he can overcome drinking alcohol. The patient is alone  today.  Allergies: No Known Allergies  Current Medications: Current Outpatient Medications  Medication Sig Dispense Refill  . allopurinol (ZYLOPRIM) 300 MG tablet Take 300 mg by mouth daily.     Marland Kitchen amLODipine (NORVASC) 5 MG tablet Take 5 mg by mouth daily.     Marland Kitchen atorvastatin (LIPITOR) 40 MG tablet Take 40 mg by mouth daily.     Marland Kitchen gabapentin (NEURONTIN) 300 MG capsule Take 300 mg by mouth 3 (three) times daily.   3  . losartan (COZAAR) 50 MG tablet Take 1 tablet  (50 mg total) by mouth daily. 30 tablet 1  . Multiple Vitamin (MULTIVITAMIN WITH MINERALS) TABS tablet Take 1 tablet by mouth daily. 30 tablet 0  . naltrexone (DEPADE) 50 MG tablet Take 50 mg by mouth daily.     . pantoprazole (PROTONIX) 40 MG tablet Take 1 tablet (40 mg total) by mouth daily. 30 tablet 1  . propranolol ER (INDERAL LA) 60 MG 24 hr capsule Take 60 mg by mouth daily.      No current facility-administered medications for this visit.    Review of Systems  Constitutional: Negative for chills, diaphoresis, fever, malaise/fatigue and weight loss (up 7 pounds).       Feels "great".  HENT: Negative.  Negative for congestion, ear pain, hearing loss, nosebleeds, sinus pain and sore throat.   Eyes: Negative.  Negative for blurred vision and double vision.  Respiratory: Negative.  Negative for cough, hemoptysis and shortness of breath.   Cardiovascular: Negative.  Negative for chest pain, palpitations and orthopnea.  Gastrointestinal: Negative for abdominal pain, blood in stool, constipation, diarrhea, heartburn, melena, nausea and vomiting.       Eating well.  Genitourinary: Negative for dysuria, frequency and urgency.       H/o kidney stones.  Musculoskeletal: Positive for falls (fell onto couch) and joint pain (chronic right knee and hip ache). Negative for back pain and myalgias.  Skin: Negative.  Negative for itching and rash.  Neurological: Negative for dizziness, tingling, sensory change, speech change, focal weakness, weakness and headaches.  Endo/Heme/Allergies: Does not bruise/bleed easily.       Diabetes is "ok".  Psychiatric/Behavioral: Positive for substance abuse (alcohol). Negative for depression and memory loss. The patient is not nervous/anxious and does not have insomnia.   All other systems reviewed and are negative.  Performance status (ECOG): 0 - Asymptomatic  Vitals Blood pressure 137/73, pulse 69, temperature (!) 97.2 F (36.2 C), temperature source  Tympanic, weight 221 lb 5.5 oz (100.4 kg), SpO2 100 %.   Physical Exam  Constitutional: He is oriented to person, place, and time. He appears well-developed and well-nourished. No distress. Face mask in place.  HENT:  Head: Normocephalic and atraumatic.  Mouth/Throat: Oropharynx is clear and moist. No oropharyngeal exudate.  Cap.  Graying hair.  Mask.  Eyes: Pupils are equal, round, and reactive to light. Conjunctivae and EOM are normal.  Brown eyes.  Neck: No JVD present.  Cardiovascular: Normal rate, regular rhythm and normal heart sounds.  No murmur heard. Pulmonary/Chest: Effort normal and breath sounds normal. No respiratory distress. He has no wheezes. He has no rales. He exhibits no tenderness.  Abdominal: Soft. Bowel sounds are normal. He exhibits no distension and no mass. There is no abdominal tenderness. There is no rebound and no guarding.  Musculoskeletal:        General: Tenderness present. No edema.     Cervical back: Normal range of motion and neck supple.     Right  knee: Tenderness present.  Lymphadenopathy:       Head (right side): No preauricular, no posterior auricular and no occipital adenopathy present.       Head (left side): No preauricular, no posterior auricular and no occipital adenopathy present.    He has no cervical adenopathy.    He has no axillary adenopathy.       Right: No inguinal and no supraclavicular adenopathy present.       Left: No inguinal and no supraclavicular adenopathy present.  Neurological: He is alert and oriented to person, place, and time.  Skin: Skin is warm. No rash noted. He is not diaphoretic. No erythema. No pallor.  Psychiatric: He has a normal mood and affect. His behavior is normal. Thought content normal.  Nursing note and vitals reviewed.   Orders Only on 01/20/2019  Component Date Value Ref Range Status  . Sodium 01/20/2019 137  135 - 145 mmol/L Final  . Potassium 01/20/2019 4.2  3.5 - 5.1 mmol/L Final  . Chloride  01/20/2019 105  98 - 111 mmol/L Final  . CO2 01/20/2019 24  22 - 32 mmol/L Final  . Glucose, Bld 01/20/2019 105* 70 - 99 mg/dL Final  . BUN 16/10/9602 25* 6 - 20 mg/dL Final  . Creatinine, Ser 01/20/2019 1.47* 0.61 - 1.24 mg/dL Final  . Calcium 54/09/8117 9.7  8.9 - 10.3 mg/dL Final  . GFR calc non Af Amer 01/20/2019 51* >60 mL/min Final  . GFR calc Af Amer 01/20/2019 59* >60 mL/min Final  . Anion gap 01/20/2019 8  5 - 15 Final   Performed at [facility], [address], [place], [place] [address]  . WBC 01/20/2019 3.0* 4.0 - 10.5 K/uL Final  . RBC 01/20/2019 3.56* 4.22 - 5.81 MIL/uL Final  . Hemoglobin 01/20/2019 10.4* 13.0 - 17.0 g/dL Final  . HCT 95/62/1308 32.5* 39.0 - 52.0 % Final  . MCV 01/20/2019 91.3  80.0 - 100.0 fL Final  . MCH 01/20/2019 29.2  26.0 - 34.0 pg Final  . MCHC 01/20/2019 32.0  30.0 - 36.0 g/dL Final  . RDW 65/78/4696 15.6* 11.5 - 15.5 % Final  . Platelets 01/20/2019 129* 150 - 400 K/uL Final  . nRBC 01/20/2019 0.0  0.0 - 0.2 % Final  . Neutrophils Relative % 01/20/2019 45  % Final  . Neutro Abs 01/20/2019 1.4* 1.7 - 7.7 K/uL Final  . Lymphocytes Relative 01/20/2019 40  % Final  . Lymphs Abs 01/20/2019 1.2  0.7 - 4.0 K/uL Final  . Monocytes Relative 01/20/2019 12  % Final  . Monocytes Absolute 01/20/2019 0.4  0.1 - 1.0 K/uL Final  . Eosinophils Relative 01/20/2019 2  % Final  . Eosinophils Absolute 01/20/2019 0.1  0.0 - 0.5 K/uL Final  . Basophils Relative 01/20/2019 1  % Final  . Basophils Absolute 01/20/2019 0.0  0.0 - 0.1 K/uL Final  . Immature Granulocytes 01/20/2019 0  % Final  . Abs Immature Granulocytes 01/20/2019 0.01  0.00 - 0.07 K/uL Final   Performed at [facility], [address], [place], [place] [address]    Assessment:  George Gomez is a 58 y.o. male with mild pancytopenia. CBCs from 07/31/2013 - 09/03/2017 reveal a hemoglobinfrom 10.1 - 14.7 without trend. MCV has been normal. Plateletshave ranged between  111,000 - 190,000. WBChas ranged between 2000 - 8300. WBC has been consistently low since 06/01/2017. He drinks alcoholdaily.  CBC on 08/08/2019revealed a hematocrit of 35.4, hemoglobin 11.5, MCV 94.5,  platelets 149,000, WBC 2000 with an ANC of 1000. Differential included 49% segs, 34% lymphocytes, 14% monocytes, 2% eosinophils, and 1% basophils. Outside labsnote negative/normal peripheral smear, LDH, and SPEP/UPEP.  Work-up on 11/07/2019revealed a hematocrit of 29.6, hemoglobin 9.3, MCV 97.7, WBC 2600 with an ANC of 1200. Differential included 48% segs, 36% lymphs, 14% monocytes, 1% eosinophils, and 1% basophils. . B12was 332 (low normal). Normal studiesincluded: Creatinine (1.05), LFTs, ferritin (257), iron studies, folate (7.4), TSH, copper, zinc, ANA, hepatitis B core antibody, hepatitis B surface antigen, hepatitis C antibody, and HIV testing. Retic was 2.2%. Peripheral smearrevealed no abnormalities. Flow cytometry on 01/23/2019 was negative.  He has B12 deficiencyandfolate deficiency. He began B12 injections on 12/11/2017 (last 12/28/2018). Folatewas 4.1 on 07/13/2018 and 8.9 on 08/10/2018.  Abdomen and pelvic CTon 05/30/2017 revealed hepatic steatosis. Spleen was normal.  He has a history of a GI bleedon 05/30/2017. EGDon 06/01/2017 revealed a normal esophagus, normal stomach, and duodenitis. Biopsy revealed reactive duodenitis without dysplasia or malignancy. Colonoscopyon 10/16/2018 revealed diverticulosis in the entire examined colon. The distal rectum and anal verge were normal on retroflexion view. There was normal mucosa in the entire examined colon. Small bowel enteroscopyon 09/19/2020was entirely normal (esophagus, stomach, duodenum, proximal and mid-jejunum).Capsule endoscopywas sub-optimal.  [facility].EGDon 07/02/2018 revealedalcoholic gastritis but no evidence of acute bleeding.  Stools were heme positive. Herequireda PRBCtransfusion. Hemoglobinrangedbetween 8.4 - 8.7. He was discharged with PPI twice daily.  [facility], vomiting and diarrhea.He had been drinking alcohol. Creatininewas 2.61 (baseline 0.83). He was dehydrated. He received IV fluids and electrolyte replacement. Creatinine was 1.77 on09/16/2020.Hemoglobin was 8.5.  [facility] with symptomatic anemia. He underwent small bowel enteroscopy and colonoscopy. He received 2 units of PRBCsand Venofer(400 mg).  Symptomatically, he feels "great".  He denies any bleeding.  Exam is unremarkable.  Plan: 1.   Labs today: CBC with diff, BMP, ferritin, iron studies. 2.Normocytic anemia Hematocrit32.5. Hemoglobin10.4MCV91.3 on 01/20/2019. Hehas a history of a GI bleed secondary to alcoholic gastritiswith associated melena. EGD and colonoscopy in 09/2018 revealed no source of GI bleeding.             Capsule endoscopy was sub-optimal. He is drinking 2 shots in 3 days.             Continue to encourage cutting back on alcohol. 3.  B12 deficiency B12 was 593 on 07/01/2018. Patient received B12 on 12/28/2018. RTC monthly x 6 for B12 (next 01/25/2019). Check folate annually (last 08/10/2018). 4. Upper GI bleed Patient has a history of GI bleed in 05/2017 and 06/2018. EGD and colonoscopy as noted above. Capsule endoscopy was sub-optimal.   Anticipate repeat resting. 5. Renal insufficiency Creatinine2.61 on 10/12/2018 with dehydration. Creatinine0.88 on 10/15/2018.  Creatinine 1.47 on 01/20/2019. Creatinine has fluctuated between 0.98 - 1.78 in the past year. Continue to monitor. 6. Weight loss,  resolved Weight up 7 pounds since last visit.  Patient's baselineweight 216-217 pounds.  Continue to monitor. 7.   RTC on 02/21/2018 (when comes in for B12) for labs (CBC, ferritin, iron studies). 8.   RTC on 03/21/2018 for MD assessment (when comes in for B12) and labs (CBC with diff, ferritin, sed rate, BMP).  I discussed the assessment and treatment plan with the patient.  The patient was provided an opportunity to ask questions and all were answered.  The patient agreed with the plan and demonstrated an understanding of  the instructions.  The patient was advised to call back if the symptoms worsen or if the condition fails to improve as anticipated.   Rosey Bath, MD, PhD    01/20/2019, 9:41 AM  I, Arnette Norris, am acting as a scribe for Rosey Bath, MD.  I, Conya Ellinwood C. Merlene Pulling, MD, have reviewed the above documentation for accuracy and completeness, and I agree with the above.

## 2019-01-17 ENCOUNTER — Ambulatory Visit: Payer: Medicare Other | Admitting: Hematology and Oncology

## 2019-01-17 ENCOUNTER — Other Ambulatory Visit: Payer: Medicare Other

## 2019-01-20 ENCOUNTER — Inpatient Hospital Stay (HOSPITAL_BASED_OUTPATIENT_CLINIC_OR_DEPARTMENT_OTHER): Payer: Medicare Other | Admitting: Hematology and Oncology

## 2019-01-20 ENCOUNTER — Inpatient Hospital Stay: Payer: Medicare Other

## 2019-01-20 ENCOUNTER — Other Ambulatory Visit: Payer: Self-pay

## 2019-01-20 ENCOUNTER — Encounter: Payer: Self-pay | Admitting: Hematology and Oncology

## 2019-01-20 VITALS — BP 137/73 | HR 69 | Temp 97.2°F | Wt 221.3 lb

## 2019-01-20 DIAGNOSIS — D649 Anemia, unspecified: Secondary | ICD-10-CM

## 2019-01-20 DIAGNOSIS — E538 Deficiency of other specified B group vitamins: Secondary | ICD-10-CM | POA: Diagnosis not present

## 2019-01-20 DIAGNOSIS — D61818 Other pancytopenia: Secondary | ICD-10-CM | POA: Diagnosis not present

## 2019-01-20 LAB — CBC WITH DIFFERENTIAL/PLATELET
Abs Immature Granulocytes: 0.01 10*3/uL (ref 0.00–0.07)
Basophils Absolute: 0 10*3/uL (ref 0.0–0.1)
Basophils Relative: 1 %
Eosinophils Absolute: 0.1 10*3/uL (ref 0.0–0.5)
Eosinophils Relative: 2 %
HCT: 32.5 % — ABNORMAL LOW (ref 39.0–52.0)
Hemoglobin: 10.4 g/dL — ABNORMAL LOW (ref 13.0–17.0)
Immature Granulocytes: 0 %
Lymphocytes Relative: 40 %
Lymphs Abs: 1.2 10*3/uL (ref 0.7–4.0)
MCH: 29.2 pg (ref 26.0–34.0)
MCHC: 32 g/dL (ref 30.0–36.0)
MCV: 91.3 fL (ref 80.0–100.0)
Monocytes Absolute: 0.4 10*3/uL (ref 0.1–1.0)
Monocytes Relative: 12 %
Neutro Abs: 1.4 10*3/uL — ABNORMAL LOW (ref 1.7–7.7)
Neutrophils Relative %: 45 %
Platelets: 129 10*3/uL — ABNORMAL LOW (ref 150–400)
RBC: 3.56 MIL/uL — ABNORMAL LOW (ref 4.22–5.81)
RDW: 15.6 % — ABNORMAL HIGH (ref 11.5–15.5)
WBC: 3 10*3/uL — ABNORMAL LOW (ref 4.0–10.5)
nRBC: 0 % (ref 0.0–0.2)

## 2019-01-20 LAB — BASIC METABOLIC PANEL
Anion gap: 8 (ref 5–15)
BUN: 25 mg/dL — ABNORMAL HIGH (ref 6–20)
CO2: 24 mmol/L (ref 22–32)
Calcium: 9.7 mg/dL (ref 8.9–10.3)
Chloride: 105 mmol/L (ref 98–111)
Creatinine, Ser: 1.47 mg/dL — ABNORMAL HIGH (ref 0.61–1.24)
GFR calc Af Amer: 59 mL/min — ABNORMAL LOW (ref >60)
GFR calc non Af Amer: 51 mL/min — ABNORMAL LOW (ref >60)
Glucose, Bld: 105 mg/dL — ABNORMAL HIGH (ref 70–99)
Potassium: 4.2 mmol/L (ref 3.5–5.1)
Sodium: 137 mmol/L (ref 135–145)

## 2019-01-20 LAB — IRON AND TIBC
Iron: 86 ug/dL (ref 45–182)
Saturation Ratios: 27 % (ref 17.9–39.5)
TIBC: 321 ug/dL (ref 250–450)
UIBC: 235 ug/dL

## 2019-01-20 LAB — FERRITIN: Ferritin: 644 ng/mL — ABNORMAL HIGH (ref 24–336)

## 2019-01-20 NOTE — Progress Notes (Signed)
Patient c/o right knee and hip pain ( pain level today 6) constantly.

## 2019-01-25 ENCOUNTER — Inpatient Hospital Stay: Payer: Medicare Other

## 2019-02-21 ENCOUNTER — Other Ambulatory Visit: Payer: Self-pay

## 2019-02-22 ENCOUNTER — Inpatient Hospital Stay: Payer: Medicare Other | Attending: Hematology and Oncology

## 2019-02-22 ENCOUNTER — Inpatient Hospital Stay: Payer: Medicare Other

## 2019-03-18 ENCOUNTER — Other Ambulatory Visit: Payer: Self-pay | Admitting: *Deleted

## 2019-03-18 DIAGNOSIS — D649 Anemia, unspecified: Secondary | ICD-10-CM

## 2019-03-20 NOTE — Progress Notes (Signed)
[facility]  [address] [place], [place] [address] Phone: [phone]  Fax: [phone]   Telephone Visit:  03/22/2019  Referring physician: [facility]  I connected with George Gomez on 03/22/2019 at 8:59 AM by telephone and verified that I was speaking with the correct person using 2 identifiers.  The patient was at a friends house.  I discussed the limitations, risk, security and privacy concerns of performing an evaluation and management service by telephone and the availability of in person appointments.  I also discussed with the patient that there may be a patient responsible charge related to this service.  The patient expressed understanding and agreed to proceed.   Chief Complaint: George Gomez is a 59 y.o. male with normocyticanemia and B12 deficiency who is seen for a 2 month assessment.   HPI:  The patient was last seen in the hematology clinic on 01/20/2019. At that time, he felt "great". He denied any bleeding. Exam was unremarkable. Hematocrit 32.5, hemoglobin 10.4, platelets 129,000, WBC 3,000. Ferritin was 644 with an iron saturation of 27% and a TIBC of 321. Creatinine was 1.47. I encouraged the patient to cut back on alcohol consumption.   Labs on 03/21/2019 included a hematocrit 34.3, hemoglobin 11.0, platelets 145,000, WBC 2,700 (ANC 1,100).  Ferritin was 1,121 with an iron saturation of 50% and a TIBC of 280. Creatinine 1.94, calcium 8.8. Sed rate was 60.   During the interim, he has felt "pretty good". He has right knee pain secondary to arthritis. His hip pain is improving. He denies any falls. He is drinking 1-2 beers/day, but not every day. He denies drinking hard alcohol. He denies any kidney stone issues.  His weight fluctuates. He is not eating well. He notes he does not feel like eating sometimes. He does not eat breakfast; his first meal his lunch. He is not drinking enough fluids throughout the day. I  encouraged increase in fluid intake.   He is not on any oral iron. Patient received his monthly B12 injection on 03/21/2019.   Past Medical History:  Diagnosis Date  . Anemia   . Diabetes mellitus without complication (HCC)   . Diverticulitis    Pt states diverticulitis  . EtOH dependence (HCC)   . Hypercholesteremia   . Hypertension     Past Surgical History:  Procedure Laterality Date  . COLONOSCOPY WITH PROPOFOL N/A 10/16/2018   Procedure: COLONOSCOPY WITH PROPOFOL;  Surgeon: Toney Reil, MD;  Location: [facility] ENDOSCOPY;  Service: Gastroenterology;  Laterality: N/A;  . ENTEROSCOPY N/A 10/16/2018   Procedure: ENTEROSCOPY;  Surgeon: Toney Reil, MD;  Location: [facility] ENDOSCOPY;  Service: Gastroenterology;  Laterality: N/A;  . ESOPHAGOGASTRODUODENOSCOPY N/A 07/02/2018   Procedure: ESOPHAGOGASTRODUODENOSCOPY (EGD);  Surgeon: Toney Reil, MD;  Location: [facility] ENDOSCOPY;  Service: Gastroenterology;  Laterality: N/A;  . ESOPHAGOGASTRODUODENOSCOPY (EGD) WITH PROPOFOL N/A 06/01/2017   Procedure: ESOPHAGOGASTRODUODENOSCOPY (EGD) WITH PROPOFOL;  Surgeon: Wyline Mood, MD;  Location: [facility] ENDOSCOPY;  Service: Gastroenterology;  Laterality: N/A;  . GIVENS CAPSULE STUDY N/A 10/17/2018   Procedure: GIVENS CAPSULE STUDY;  Surgeon: Toney Reil, MD;  Location: [facility] ENDOSCOPY;  Service: Gastroenterology;  Laterality: N/A;  . none      Family History  Problem Relation Age of Onset  . Rheum arthritis Mother   . Diabetes Father   . Heart disease Father   . Cancer Father   . Cancer Maternal Grandfather   . Cancer Paternal Grandfather  Social History:  reports that he has never smoked. He has never used smokeless tobacco. He reports current alcohol use of about 6.0 standard drinks of alcohol per week. He reports that he does not use drugs. He drankliquor 2-3 x/day. He is drinking less than he did in the past (moonshine), about 2-3 shots daily. He did "crack" 10-15 years  ago. He lives in [place]. Is no longer socially drinking, He says that if can overcome doing crack he can overcome drinking alcohol. The patient is alone today.  Participants in the patient's visit and their role in the encounter included the patient and Bronwen Betters, CMA, today.  The intake visit was provided by Bronwen Betters, CMA.  Allergies: No Known Allergies  Current Medications: Current Outpatient Medications  Medication Sig Dispense Refill  . allopurinol (ZYLOPRIM) 300 MG tablet Take 300 mg by mouth daily.     Marland Kitchen amLODipine (NORVASC) 5 MG tablet Take 5 mg by mouth daily.     Marland Kitchen atorvastatin (LIPITOR) 40 MG tablet Take 40 mg by mouth daily.     Marland Kitchen gabapentin (NEURONTIN) 300 MG capsule Take 300 mg by mouth 3 (three) times daily.   3  . losartan (COZAAR) 50 MG tablet Take 1 tablet (50 mg total) by mouth daily. 30 tablet 1  . Multiple Vitamin (MULTIVITAMIN WITH MINERALS) TABS tablet Take 1 tablet by mouth daily. 30 tablet 0  . naltrexone (DEPADE) 50 MG tablet Take 50 mg by mouth daily.     . pantoprazole (PROTONIX) 40 MG tablet Take 1 tablet (40 mg total) by mouth daily. 30 tablet 1  . propranolol ER (INDERAL LA) 60 MG 24 hr capsule Take 60 mg by mouth daily.      No current facility-administered medications for this visit.    Review of Systems  Constitutional: Negative.  Negative for chills, diaphoresis, fever, malaise/fatigue and weight loss (fluctuates).       Feels "pretty good".  HENT: Negative.  Negative for congestion, ear pain, hearing loss, nosebleeds, sinus pain and sore throat.   Eyes: Negative.  Negative for blurred vision and double vision.  Respiratory: Negative.  Negative for cough, hemoptysis and shortness of breath.   Cardiovascular: Negative.  Negative for chest pain, palpitations and orthopnea.  Gastrointestinal: Negative for abdominal pain, blood in stool, constipation, diarrhea, heartburn, melena, nausea and vomiting.       Not eating well.    Genitourinary: Negative for dysuria, frequency and urgency.       H/o kidney stones- no recent symptoms.  Musculoskeletal: Positive for joint pain (chronic right knee; hip ache improving). Negative for back pain, falls and myalgias.  Skin: Negative.  Negative for itching and rash.  Neurological: Negative.  Negative for dizziness, tingling, sensory change, speech change, focal weakness, weakness and headaches.  Endo/Heme/Allergies: Does not bruise/bleed easily.       Diabetes is "ok".  Psychiatric/Behavioral: Positive for substance abuse (alcohol; 1-2 beers/day). Negative for depression and memory loss. The patient is not nervous/anxious and does not have insomnia.   All other systems reviewed and are negative.   Performance status (ECOG):  0  Physical Exam  Constitutional: He is oriented to person, place, and time.  Neurological: He is alert and oriented to person, place, and time.  Psychiatric: He has a normal mood and affect. His behavior is normal. Judgment and thought content normal.  Nursing note reviewed.   Appointment on 03/21/2019  Component Date Value Ref Range Status  . WBC 03/21/2019 2.7* 4.0 -  10.5 K/uL Final  . RBC 03/21/2019 3.78* 4.22 - 5.81 MIL/uL Final  . Hemoglobin 03/21/2019 11.0* 13.0 - 17.0 g/dL Final  . HCT 40/98/1191 34.3* 39.0 - 52.0 % Final  . MCV 03/21/2019 90.7  80.0 - 100.0 fL Final  . MCH 03/21/2019 29.1  26.0 - 34.0 pg Final  . MCHC 03/21/2019 32.1  30.0 - 36.0 g/dL Final  . RDW 47/82/9562 16.1* 11.5 - 15.5 % Final  . Platelets 03/21/2019 145* 150 - 400 K/uL Final  . nRBC 03/21/2019 0.0  0.0 - 0.2 % Final  . Neutrophils Relative % 03/21/2019 42  % Final  . Neutro Abs 03/21/2019 1.1* 1.7 - 7.7 K/uL Final  . Lymphocytes Relative 03/21/2019 41  % Final  . Lymphs Abs 03/21/2019 1.1  0.7 - 4.0 K/uL Final  . Monocytes Relative 03/21/2019 12  % Final  . Monocytes Absolute 03/21/2019 0.3  0.1 - 1.0 K/uL Final  . Eosinophils Relative 03/21/2019 2  % Final   . Eosinophils Absolute 03/21/2019 0.1  0.0 - 0.5 K/uL Final  . Basophils Relative 03/21/2019 3  % Final  . Basophils Absolute 03/21/2019 0.1  0.0 - 0.1 K/uL Final  . Immature Granulocytes 03/21/2019 0  % Final  . Abs Immature Granulocytes 03/21/2019 0.01  0.00 - 0.07 K/uL Final   Performed at [facility], [address] [place], [place] [address]  . Blood Bank Specimen 03/21/2019 SAMPLE AVAILABLE FOR TESTING   Final  . Sample Expiration 03/21/2019    Final                   Value:03/24/2019,2359 Performed at [facility], [address] [place] [place], [place] [address]   . Sed Rate 03/21/2019 60* 0 - 20 mm/hr Final   Performed at [facility], [address], [place], [place] [address]  . Ferritin 03/21/2019 1,121* 24 - 336 ng/mL Final   Performed at [facility], [address] [place] [place], [place] [address]  . Sodium 03/21/2019 136  135 - 145 mmol/L Final  . Potassium 03/21/2019 4.4  3.5 - 5.1 mmol/L Final  . Chloride 03/21/2019 102  98 - 111 mmol/L Final  . CO2 03/21/2019 21* 22 - 32 mmol/L Final  . Glucose, Bld 03/21/2019 89  70 - 99 mg/dL Final  . BUN 24/40/1027 43* 8 - 23 mg/dL Final  . Creatinine, Ser 03/21/2019 1.94* 0.61 - 1.24 mg/dL Final  . Calcium 25/36/6440 8.8* 8.9 - 10.3 mg/dL Final  . GFR calc non Af Amer 03/21/2019 36* >60 mL/min Final  . GFR calc Af Amer 03/21/2019 42* >60 mL/min Final  . Anion gap 03/21/2019 13  5 - 15 Final   Performed at [facility], [address], [place], [place] [address]  . Iron 03/21/2019 141  45 - 182 ug/dL Final  . TIBC 59/56/3875 280  250 - 450 ug/dL Final  . Saturation Ratios 03/21/2019 50* 17.9 - 39.5 % Final  . UIBC 03/21/2019 139  ug/dL Final   Performed at [facility], [address] [place] [place], [place] [address]    Assessment:  George Gomez is a 59 y.o. male with mild pancytopenia. CBCs from 07/31/2013 - 09/03/2017 reveal a hemoglobinfrom 10.1 - 14.7 without  trend. MCV has been normal. Plateletshave ranged between 111,000 - 190,000. WBChas ranged between 2000 - 8300. WBC has been consistently low since 06/01/2017. He drinks alcoholdaily.  CBC on 08/08/2019revealed a hematocrit of 35.4, hemoglobin 11.5, MCV  94.5, platelets 149,000, WBC 2000 with an ANC of 1000. Differential included 49% segs, 34% lymphocytes, 14% monocytes, 2% eosinophils, and 1% basophils. Outside labsnote negative/normal peripheral smear, LDH, and SPEP/UPEP.  Work-up on 11/07/2019revealed a hematocrit of 29.6, hemoglobin 9.3, MCV 97.7, WBC 2600 with an ANC of 1200. Differential included 48% segs, 36% lymphs, 14% monocytes, 1% eosinophils, and 1% basophils. . B12was 332 (low normal). Normal studiesincluded: Creatinine (1.05), LFTs, ferritin (257), iron studies, folate (7.4), TSH, copper, zinc, ANA, hepatitis B core antibody, hepatitis B surface antigen, hepatitis C antibody, and HIV testing. Retic was 2.2%. Peripheral smearrevealed no abnormalities. Flow cytometry on 01/23/2019 was negative.  He has B12 deficiencyandfolate deficiency. He began B12 injections on 12/11/2017 (last 03/21/2019). Folatewas 4.1 on 07/13/2018 and 8.9 on 08/10/2018.  Abdomen and pelvic CTon 05/30/2017 revealed hepatic steatosis. Spleen was normal.  He has a history of a GI bleedon 05/30/2017. EGDon 06/01/2017 revealed a normal esophagus, normal stomach, and duodenitis. Biopsy revealed reactive duodenitis without dysplasia or malignancy. Colonoscopyon 10/16/2018 revealed diverticulosis in the entire examined colon. The distal rectum and anal verge were normal on retroflexion view. There was normal mucosa in the entire examined colon. Small bowel enteroscopyon 09/19/2020was entirely normal (esophagus, stomach, duodenum, proximal and mid-jejunum).Capsule endoscopywas sub-optimal.  [facility].EGDon 07/02/2018  revealedalcoholic gastritis but no evidence of acute bleeding. Stools were heme positive. Herequireda PRBCtransfusion. Hemoglobinrangedbetween 8.4 - 8.7. He was discharged with PPI twice daily.  [facility] with nausea, vomiting and diarrhea.He had been drinking alcohol. Creatininewas 2.61 (baseline 0.83). He was dehydrated. He received IV fluids and electrolyte replacement. Creatinine was 1.77 on09/16/2020.Hemoglobin was 8.5.  [facility] with symptomatic anemia. He underwent small bowel enteroscopy and colonoscopy. He received 2 units of PRBCsand Venofer(400 mg).  Symptomatically, he feels "pretty good".  Weight fluctuates.  Fluid intake is poor.  He drinks 1-2 beers/day.  Plan: 1.   Review labs from 03/21/2019. 2.Normocytic anemia Hematocrit34.3. Hemoglobin11.0MCV90.7 on 03/21/2019. Hehas a history of a GI bleed secondary to alcoholic gastritiswith associated melena. EGD and colonoscopyin 09/2020revealed no source of GI bleeding. Capsule endoscopy was sub-optimal. He is drinking 1-2 beers/day. Continue to encourage cutting back alcohol intake.  3.  B12 deficiency B12 was 593 on 07/01/2018. He receives B12 monthly (last 03/21/2019). RTC monthly x 6 for B12. Check folate annually (last 08/10/2018). 4. Upper GI bleed Patient has a history of GI bleed in 05/2017 and 06/2018. EGD and colonoscopy as noted above. Capsule endoscopy was sub-optimal.                         Anticipate repeat resting.  He notes no recent follow-up with GI.   Send note to Dr Allegra Lai. 5. Renal insufficiency Creatinine2.61 on 10/12/2018 with dehydration. Creatinine0.88on 10/15/2018.             Creatinine 1.47  on 01/20/2019.  Creatinine 1.94 on 03/21/2019. Creatinine has fluctuated between 0.98 - 1.78 in the past year. Check SPEP and FLCA next week.  Suspect component of decreased fluid intake.  Consult nephrology. 6. Weight loss, resolved Patient notes weight fluctuates from 207 pounds to 217 pounds.  Continue to monitor. 7.   Abnormal iron saturation  Ferritin was 1121 with an iron saturation of 50% on 03/21/2019.  Prior iron saturations 16-45%.  Ferritin fluctuating between [phone] since 12/03/2017.  Suspect acute phase reactant.  Check hemochromatosis assay. 8.  RTC 1 week for labs: BMP, hemochromatosis assay, myeloma panel, FLCA. 9.   RTC in 3 months for MD assessment and labs (CBC with diff, CMP, ferritin).  I discussed the assessment and treatment plan with the patient.  The patient was provided an opportunity to ask questions and all were answered.  The patient agreed with the plan and demonstrated an understanding of the instructions.  The patient was advised to call back or seek an in person evaluation if the symptoms worsen or if the condition fails to improve as anticipated.  I provided 14 minutes (8:59 AM - 9:13 AM) of non face-to-face video visit time during this this encounter and > 50% was spent counseling as documented under my assessment and plan.  I provided these services from the [facility] office.   Nolon Stalls, MD, PhD  03/22/2019, 8:59 AM  I, Selena Batten, am acting as scribe for [employer]. Mike Gip, MD, PhD.  I, Calais Svehla C. Mike Gip, MD, have reviewed the above documentation for accuracy and completeness, and I agree with the above.

## 2019-03-21 ENCOUNTER — Inpatient Hospital Stay: Payer: Medicare Other | Attending: Hematology and Oncology

## 2019-03-21 ENCOUNTER — Other Ambulatory Visit: Payer: Self-pay

## 2019-03-21 ENCOUNTER — Other Ambulatory Visit: Payer: Self-pay | Admitting: Hematology and Oncology

## 2019-03-21 ENCOUNTER — Inpatient Hospital Stay: Payer: Medicare Other

## 2019-03-21 VITALS — BP 158/84 | HR 69 | Temp 97.9°F | Resp 18

## 2019-03-21 DIAGNOSIS — E538 Deficiency of other specified B group vitamins: Secondary | ICD-10-CM

## 2019-03-21 DIAGNOSIS — D696 Thrombocytopenia, unspecified: Secondary | ICD-10-CM

## 2019-03-21 DIAGNOSIS — D649 Anemia, unspecified: Secondary | ICD-10-CM

## 2019-03-21 DIAGNOSIS — R7989 Other specified abnormal findings of blood chemistry: Secondary | ICD-10-CM

## 2019-03-21 LAB — BASIC METABOLIC PANEL
Anion gap: 13 (ref 5–15)
BUN: 43 mg/dL — ABNORMAL HIGH (ref 8–23)
CO2: 21 mmol/L — ABNORMAL LOW (ref 22–32)
Calcium: 8.8 mg/dL — ABNORMAL LOW (ref 8.9–10.3)
Chloride: 102 mmol/L (ref 98–111)
Creatinine, Ser: 1.94 mg/dL — ABNORMAL HIGH (ref 0.61–1.24)
GFR calc Af Amer: 42 mL/min — ABNORMAL LOW (ref >60)
GFR calc non Af Amer: 36 mL/min — ABNORMAL LOW (ref >60)
Glucose, Bld: 89 mg/dL (ref 70–99)
Potassium: 4.4 mmol/L (ref 3.5–5.1)
Sodium: 136 mmol/L (ref 135–145)

## 2019-03-21 LAB — CBC WITH DIFFERENTIAL/PLATELET
Abs Immature Granulocytes: 0.01 10*3/uL (ref 0.00–0.07)
Basophils Absolute: 0.1 10*3/uL (ref 0.0–0.1)
Basophils Relative: 3 %
Eosinophils Absolute: 0.1 10*3/uL (ref 0.0–0.5)
Eosinophils Relative: 2 %
HCT: 34.3 % — ABNORMAL LOW (ref 39.0–52.0)
Hemoglobin: 11 g/dL — ABNORMAL LOW (ref 13.0–17.0)
Immature Granulocytes: 0 %
Lymphocytes Relative: 41 %
Lymphs Abs: 1.1 10*3/uL (ref 0.7–4.0)
MCH: 29.1 pg (ref 26.0–34.0)
MCHC: 32.1 g/dL (ref 30.0–36.0)
MCV: 90.7 fL (ref 80.0–100.0)
Monocytes Absolute: 0.3 10*3/uL (ref 0.1–1.0)
Monocytes Relative: 12 %
Neutro Abs: 1.1 10*3/uL — ABNORMAL LOW (ref 1.7–7.7)
Neutrophils Relative %: 42 %
Platelets: 145 10*3/uL — ABNORMAL LOW (ref 150–400)
RBC: 3.78 MIL/uL — ABNORMAL LOW (ref 4.22–5.81)
RDW: 16.1 % — ABNORMAL HIGH (ref 11.5–15.5)
WBC: 2.7 10*3/uL — ABNORMAL LOW (ref 4.0–10.5)
nRBC: 0 % (ref 0.0–0.2)

## 2019-03-21 LAB — SAMPLE TO BLOOD BANK

## 2019-03-21 LAB — IRON AND TIBC
Iron: 141 ug/dL (ref 45–182)
Saturation Ratios: 50 % — ABNORMAL HIGH (ref 17.9–39.5)
TIBC: 280 ug/dL (ref 250–450)
UIBC: 139 ug/dL

## 2019-03-21 LAB — FERRITIN: Ferritin: 1121 ng/mL — ABNORMAL HIGH (ref 24–336)

## 2019-03-21 LAB — SEDIMENTATION RATE: Sed Rate: 60 mm/hr — ABNORMAL HIGH (ref 0–20)

## 2019-03-21 MED ORDER — CYANOCOBALAMIN 1000 MCG/ML IJ SOLN
1000.0000 ug | Freq: Once | INTRAMUSCULAR | Status: AC
  Administered 2019-03-21: 09:00:00 1000 ug via INTRAMUSCULAR

## 2019-03-21 NOTE — Progress Notes (Signed)
No new changes noted today. The patient Name DOB has been verified by phone today.

## 2019-03-21 NOTE — Patient Instructions (Signed)

## 2019-03-22 ENCOUNTER — Inpatient Hospital Stay: Payer: Medicare Other

## 2019-03-22 ENCOUNTER — Other Ambulatory Visit: Payer: Medicare Other

## 2019-03-22 ENCOUNTER — Inpatient Hospital Stay (HOSPITAL_BASED_OUTPATIENT_CLINIC_OR_DEPARTMENT_OTHER): Payer: Medicare Other | Admitting: Hematology and Oncology

## 2019-03-22 ENCOUNTER — Encounter: Payer: Self-pay | Admitting: Hematology and Oncology

## 2019-03-22 ENCOUNTER — Telehealth: Payer: Self-pay

## 2019-03-22 DIAGNOSIS — N289 Disorder of kidney and ureter, unspecified: Secondary | ICD-10-CM

## 2019-03-22 DIAGNOSIS — D509 Iron deficiency anemia, unspecified: Secondary | ICD-10-CM

## 2019-03-22 DIAGNOSIS — E785 Hyperlipidemia, unspecified: Secondary | ICD-10-CM

## 2019-03-22 DIAGNOSIS — K219 Gastro-esophageal reflux disease without esophagitis: Secondary | ICD-10-CM

## 2019-03-22 DIAGNOSIS — D649 Anemia, unspecified: Secondary | ICD-10-CM

## 2019-03-22 DIAGNOSIS — E538 Deficiency of other specified B group vitamins: Secondary | ICD-10-CM

## 2019-03-22 DIAGNOSIS — F1010 Alcohol abuse, uncomplicated: Secondary | ICD-10-CM

## 2019-03-22 DIAGNOSIS — M1990 Unspecified osteoarthritis, unspecified site: Secondary | ICD-10-CM

## 2019-03-22 DIAGNOSIS — I10 Essential (primary) hypertension: Secondary | ICD-10-CM

## 2019-03-22 DIAGNOSIS — Z79899 Other long term (current) drug therapy: Secondary | ICD-10-CM

## 2019-03-22 DIAGNOSIS — R790 Abnormal level of blood mineral: Secondary | ICD-10-CM | POA: Diagnosis not present

## 2019-03-22 DIAGNOSIS — E119 Type 2 diabetes mellitus without complications: Secondary | ICD-10-CM

## 2019-03-22 NOTE — Telephone Encounter (Signed)
-----   Message from Toney Reil, MD sent at 03/22/2019  4:15 PM EST ----- Regarding: VCE Morrie Sheldon  Can you try reach out to patient again regarding capsule endoscopy? We have tried in the past with no response  Thanks RV ----- Message ----- From: Rosey Bath, MD Sent: 03/22/2019   4:02 PM EST To: Toney Reil, MD

## 2019-03-22 NOTE — Telephone Encounter (Signed)
Patient states he had this done already last year and does not want to have this again. He states when he had this done before he followed all instructions.

## 2019-03-22 NOTE — Telephone Encounter (Signed)
It was an incomplete study. Therefore, I recommended a repeat VCE last year itself. He can call us back if he decides otherwise  RV

## 2019-03-23 ENCOUNTER — Other Ambulatory Visit: Payer: Self-pay

## 2019-03-23 DIAGNOSIS — D509 Iron deficiency anemia, unspecified: Secondary | ICD-10-CM

## 2019-03-23 NOTE — Telephone Encounter (Signed)
Called patient this morning and explained this again to the patient. Patient decided he would do the capsule study. Made capsule study for 04/13/2019. Went over instructions with patient and mailed them to patient

## 2019-03-23 NOTE — Addendum Note (Signed)
Addended by: Radene Knee L on: 03/23/2019 08:23 AM   Modules accepted: Orders

## 2019-03-29 ENCOUNTER — Inpatient Hospital Stay: Payer: Medicare Other

## 2019-03-31 ENCOUNTER — Inpatient Hospital Stay: Payer: Medicare Other | Attending: Hematology and Oncology

## 2019-03-31 ENCOUNTER — Other Ambulatory Visit: Payer: Self-pay

## 2019-03-31 DIAGNOSIS — R790 Abnormal level of blood mineral: Secondary | ICD-10-CM

## 2019-03-31 DIAGNOSIS — N289 Disorder of kidney and ureter, unspecified: Secondary | ICD-10-CM

## 2019-03-31 DIAGNOSIS — D649 Anemia, unspecified: Secondary | ICD-10-CM | POA: Insufficient documentation

## 2019-03-31 DIAGNOSIS — E538 Deficiency of other specified B group vitamins: Secondary | ICD-10-CM | POA: Diagnosis present

## 2019-03-31 LAB — BASIC METABOLIC PANEL
Anion gap: 11 (ref 5–15)
BUN: 32 mg/dL — ABNORMAL HIGH (ref 8–23)
CO2: 20 mmol/L — ABNORMAL LOW (ref 22–32)
Calcium: 9.4 mg/dL (ref 8.9–10.3)
Chloride: 103 mmol/L (ref 98–111)
Creatinine, Ser: 1.77 mg/dL — ABNORMAL HIGH (ref 0.61–1.24)
GFR calc Af Amer: 47 mL/min — ABNORMAL LOW (ref >60)
GFR calc non Af Amer: 41 mL/min — ABNORMAL LOW (ref >60)
Glucose, Bld: 126 mg/dL — ABNORMAL HIGH (ref 70–99)
Potassium: 4.5 mmol/L (ref 3.5–5.1)
Sodium: 134 mmol/L — ABNORMAL LOW (ref 135–145)

## 2019-04-01 LAB — KAPPA/LAMBDA LIGHT CHAINS
Kappa free light chain: 39.7 mg/L — ABNORMAL HIGH (ref 3.3–19.4)
Kappa, lambda light chain ratio: 1.02 (ref 0.26–1.65)
Lambda free light chains: 39.1 mg/L — ABNORMAL HIGH (ref 5.7–26.3)

## 2019-04-04 LAB — MULTIPLE MYELOMA PANEL, SERUM
Albumin SerPl Elph-Mcnc: 3.8 g/dL (ref 2.9–4.4)
Albumin/Glob SerPl: 1.3 (ref 0.7–1.7)
Alpha 1: 0.2 g/dL (ref 0.0–0.4)
Alpha2 Glob SerPl Elph-Mcnc: 0.9 g/dL (ref 0.4–1.0)
B-Globulin SerPl Elph-Mcnc: 0.8 g/dL (ref 0.7–1.3)
Gamma Glob SerPl Elph-Mcnc: 1.1 g/dL (ref 0.4–1.8)
Globulin, Total: 3.1 g/dL (ref 2.2–3.9)
IgA: 194 mg/dL (ref 61–437)
IgG (Immunoglobin G), Serum: 1164 mg/dL (ref 603–1613)
IgM (Immunoglobulin M), Srm: 94 mg/dL (ref 20–172)
Total Protein ELP: 6.9 g/dL (ref 6.0–8.5)

## 2019-04-05 LAB — HEMOCHROMATOSIS DNA-PCR(C282Y,H63D)

## 2019-04-11 ENCOUNTER — Other Ambulatory Visit: Payer: Self-pay

## 2019-04-11 ENCOUNTER — Emergency Department: Payer: Medicare Other

## 2019-04-11 ENCOUNTER — Emergency Department
Admission: EM | Admit: 2019-04-11 | Discharge: 2019-04-12 | Disposition: A | Discharge status: Home / Self Care | Payer: Medicare Other | Attending: Emergency Medicine | Admitting: Emergency Medicine

## 2019-04-11 DIAGNOSIS — Z7984 Long term (current) use of oral hypoglycemic drugs: Secondary | ICD-10-CM | POA: Diagnosis not present

## 2019-04-11 DIAGNOSIS — I10 Essential (primary) hypertension: Secondary | ICD-10-CM | POA: Insufficient documentation

## 2019-04-11 DIAGNOSIS — F10220 Alcohol dependence with intoxication, uncomplicated: Secondary | ICD-10-CM | POA: Insufficient documentation

## 2019-04-11 DIAGNOSIS — R4182 Altered mental status, unspecified: Secondary | ICD-10-CM | POA: Diagnosis not present

## 2019-04-11 DIAGNOSIS — E119 Type 2 diabetes mellitus without complications: Secondary | ICD-10-CM | POA: Insufficient documentation

## 2019-04-11 DIAGNOSIS — Y908 Blood alcohol level of 240 mg/100 ml or more: Secondary | ICD-10-CM | POA: Diagnosis not present

## 2019-04-11 DIAGNOSIS — Z79899 Other long term (current) drug therapy: Secondary | ICD-10-CM | POA: Diagnosis not present

## 2019-04-11 DIAGNOSIS — F10920 Alcohol use, unspecified with intoxication, uncomplicated: Secondary | ICD-10-CM

## 2019-04-11 LAB — CBC WITH DIFFERENTIAL/PLATELET
Abs Immature Granulocytes: 0.03 10*3/uL (ref 0.00–0.07)
Basophils Absolute: 0.1 10*3/uL (ref 0.0–0.1)
Basophils Relative: 1 %
Eosinophils Absolute: 0.1 10*3/uL (ref 0.0–0.5)
Eosinophils Relative: 2 %
HCT: 31.6 % — ABNORMAL LOW (ref 39.0–52.0)
Hemoglobin: 10 g/dL — ABNORMAL LOW (ref 13.0–17.0)
Immature Granulocytes: 1 %
Lymphocytes Relative: 40 %
Lymphs Abs: 1.8 10*3/uL (ref 0.7–4.0)
MCH: 29.9 pg (ref 26.0–34.0)
MCHC: 31.6 g/dL (ref 30.0–36.0)
MCV: 94.3 fL (ref 80.0–100.0)
Monocytes Absolute: 0.5 10*3/uL (ref 0.1–1.0)
Monocytes Relative: 11 %
Neutro Abs: 2 10*3/uL (ref 1.7–7.7)
Neutrophils Relative %: 45 %
Platelets: 156 10*3/uL (ref 150–400)
RBC: 3.35 MIL/uL — ABNORMAL LOW (ref 4.22–5.81)
RDW: 16.6 % — ABNORMAL HIGH (ref 11.5–15.5)
WBC: 4.4 10*3/uL (ref 4.0–10.5)
nRBC: 0.5 % — ABNORMAL HIGH (ref 0.0–0.2)

## 2019-04-11 LAB — TROPONIN I (HIGH SENSITIVITY): Troponin I (High Sensitivity): 5 ng/L (ref <18)

## 2019-04-11 LAB — COMPREHENSIVE METABOLIC PANEL
ALT: 47 U/L — ABNORMAL HIGH (ref 0–44)
AST: 85 U/L — ABNORMAL HIGH (ref 15–41)
Albumin: 4 g/dL (ref 3.5–5.0)
Alkaline Phosphatase: 95 U/L (ref 38–126)
Anion gap: 13 (ref 5–15)
BUN: 22 mg/dL (ref 8–23)
CO2: 19 mmol/L — ABNORMAL LOW (ref 22–32)
Calcium: 9.1 mg/dL (ref 8.9–10.3)
Chloride: 110 mmol/L (ref 98–111)
Creatinine, Ser: 1.09 mg/dL (ref 0.61–1.24)
GFR calc Af Amer: >60 mL/min (ref >60)
GFR calc non Af Amer: >60 mL/min (ref >60)
Glucose, Bld: 104 mg/dL — ABNORMAL HIGH (ref 70–99)
Potassium: 4.8 mmol/L (ref 3.5–5.1)
Sodium: 142 mmol/L (ref 135–145)
Total Bilirubin: 1.1 mg/dL (ref 0.3–1.2)
Total Protein: 7.5 g/dL (ref 6.5–8.1)

## 2019-04-11 LAB — URINE DRUG SCREEN, QUALITATIVE (ARMC ONLY)
Amphetamines, Ur Screen: NOT DETECTED
Barbiturates, Ur Screen: NOT DETECTED
Benzodiazepine, Ur Scrn: NOT DETECTED
Cannabinoid 50 Ng, Ur ~~LOC~~: NOT DETECTED
Cocaine Metabolite,Ur ~~LOC~~: NOT DETECTED
MDMA (Ecstasy)Ur Screen: NOT DETECTED
Methadone Scn, Ur: NOT DETECTED
Opiate, Ur Screen: NOT DETECTED
Phencyclidine (PCP) Ur S: NOT DETECTED
Tricyclic, Ur Screen: NOT DETECTED

## 2019-04-11 LAB — URINALYSIS, ROUTINE W REFLEX MICROSCOPIC
Bilirubin Urine: NEGATIVE
Glucose, UA: NEGATIVE mg/dL
Hgb urine dipstick: NEGATIVE
Ketones, ur: NEGATIVE mg/dL
Leukocytes,Ua: NEGATIVE
Nitrite: NEGATIVE
Protein, ur: NEGATIVE mg/dL
Specific Gravity, Urine: 1.003 — ABNORMAL LOW (ref 1.005–1.030)
pH: 6 (ref 5.0–8.0)

## 2019-04-11 LAB — ETHANOL: Alcohol, Ethyl (B): 495 mg/dL (ref <10)

## 2019-04-11 LAB — LIPASE, BLOOD: Lipase: 46 U/L (ref 11–51)

## 2019-04-11 LAB — CK: Total CK: 184 U/L (ref 49–397)

## 2019-04-11 NOTE — ED Provider Notes (Signed)
-----------------------------------------   11:08 PM on 04/11/2019 -----------------------------------------  Assuming care from Dr. Goertzen Plan.  In short, George Gomez is a 59 y.o. male with a chief complaint of AMS/intoxication.  Refer to the original H&P for additional details.  The current plan of care is to reassess when clinically sober.  ----------------------------------------- 6:41 AM on 04/12/2019 -----------------------------------------  Patient has slept all night.  He is now ambulatory to the bathroom without the need of assistance.  He is speaking in complete and coherent sentences.  He says he feels much better and is ready to go home.  He was surprised to hear how high his alcohol level was but admits that he drinks every day.  I advised him about the severity of his intoxication and encouraged him to try to decrease the amount he drinks I will provide some outpatient resources for him should he choose to pursue them.  He is going to call a family member to come pick him up.   Loleta Rose, MD 04/12/19 [phone]

## 2019-04-11 NOTE — ED Triage Notes (Signed)
Pt came in by EMS from home, was found unresponsive in his front yard. No known injury per EMS, pt has had large amount of etoh today. 16 g to left FA per ems, ivf started.

## 2019-04-11 NOTE — ED Notes (Signed)
Light green blood top drawn by Jonny Ruiz EDT and sent to the lab at this time

## 2019-04-11 NOTE — ED Notes (Addendum)
Pt lying in bed, pt appears intoxicated and is unable to retain information relayed to him at this time. Pt encouraged to stay in bed and call for assistance. Pt in NAD at this time with VS WNL, RR equal and unlabored. Call alarm on pt for safety

## 2019-04-11 NOTE — ED Provider Notes (Signed)
[facility] Emergency Department Provider Note  ____________________________________________   First MD Initiated Contact with Patient 04/11/19 2124     (approximate)  I have reviewed the triage vital signs and the nursing notes.   HISTORY  Chief Complaint Alcohol Intoxication    HPI Arad Burston is a 59 y.o. male with diabetes, EtOH dependence who comes in with alcohol intoxication.  Bystander called EMS due to patient being in his yard passed out.  On EMS arrival there was no bystander available but patient did awaken to sternal rub.  No evidence of trauma.  Patient appeared intoxicated and was slightly agitated but was able to get into the EMS truck to be evaluated.  Patient felt cold to touch and they reported that he had groceries in his car that were head now hot.  Unclear exactly how long he had been outside.  Patient himself denies any symptoms.  Does endorse to drinking alcohol.  Unable to get full HPI due to patient's altered mental status secondary to alcohol intoxication       Past Medical History:  Diagnosis Date  . Anemia   . Diabetes mellitus without complication (HCC)   . Diverticulitis    Pt states diverticulitis  . EtOH dependence (HCC)   . Hypercholesteremia   . Hypertension     Patient Active Problem List   Diagnosis Date Noted  . Abnormal iron saturation 03/22/2019  . Melena 10/21/2018  . Anemia 10/14/2018  . Acute renal failure (HCC) 10/12/2018  . Weight loss 09/15/2018  . Renal insufficiency 09/15/2018  . Folate deficiency 07/25/2018  . B12 deficiency 12/11/2017  . Normocytic anemia 12/03/2017  . Thrombocytopenia (HCC) 12/03/2017  . Leukopenia 12/03/2017  . GI bleed 05/30/2017  . Acute renal failure (ARF) (HCC) 08/28/2016  . Acute on chronic renal failure (HCC) 03/20/2016  . Dehydration 03/20/2016  . Lactic acidosis 03/20/2016  . Hyponatremia 03/20/2016  . Alcohol abuse 03/20/2016  . Clostridium difficile  diarrhea 03/18/2016    Past Surgical History:  Procedure Laterality Date  . COLONOSCOPY WITH PROPOFOL N/A 10/16/2018   Procedure: COLONOSCOPY WITH PROPOFOL;  Surgeon: Toney Reil, MD;  Location: [facility] ENDOSCOPY;  Service: Gastroenterology;  Laterality: N/A;  . ENTEROSCOPY N/A 10/16/2018   Procedure: ENTEROSCOPY;  Surgeon: Toney Reil, MD;  Location: [facility] ENDOSCOPY;  Service: Gastroenterology;  Laterality: N/A;  . ESOPHAGOGASTRODUODENOSCOPY N/A 07/02/2018   Procedure: ESOPHAGOGASTRODUODENOSCOPY (EGD);  Surgeon: Toney Reil, MD;  Location: [facility] ENDOSCOPY;  Service: Gastroenterology;  Laterality: N/A;  . ESOPHAGOGASTRODUODENOSCOPY (EGD) WITH PROPOFOL N/A 06/01/2017   Procedure: ESOPHAGOGASTRODUODENOSCOPY (EGD) WITH PROPOFOL;  Surgeon: Wyline Mood, MD;  Location: [facility] ENDOSCOPY;  Service: Gastroenterology;  Laterality: N/A;  . GIVENS CAPSULE STUDY N/A 10/17/2018   Procedure: GIVENS CAPSULE STUDY;  Surgeon: Toney Reil, MD;  Location: [facility] ENDOSCOPY;  Service: Gastroenterology;  Laterality: N/A;  . none      Prior to Admission medications   Medication Sig Start Date End Date Taking? Authorizing Provider  allopurinol (ZYLOPRIM) 300 MG tablet Take 300 mg by mouth daily.     [provider]  amLODipine (NORVASC) 5 MG tablet Take 5 mg by mouth daily.  06/29/18   [provider]  atorvastatin (LIPITOR) 40 MG tablet Take 40 mg by mouth daily.     [provider]  gabapentin (NEURONTIN) 300 MG capsule Take 300 mg by mouth 3 (three) times daily.     [provider]  losartan (COZAAR) 50 MG tablet Take 1 tablet (  50 mg total) by mouth daily. 10/13/18   Fritzi Mandes, MD  Multiple Vitamin (MULTIVITAMIN WITH MINERALS) TABS tablet Take 1 tablet by mouth daily. 10/14/18   Fritzi Mandes, MD  naltrexone (DEPADE) 50 MG tablet Take 50 mg by mouth daily.  06/15/18   [provider]  pantoprazole (PROTONIX) 40 MG tablet Take 1 tablet (40 mg total) by  mouth daily. 10/17/18 03/21/19  Fritzi Mandes, MD  propranolol ER (INDERAL LA) 60 MG 24 hr capsule Take 60 mg by mouth daily.  06/29/18   [provider]    Allergies Patient has no known allergies.  Family History  Problem Relation Age of Onset  . Rheum arthritis Mother   . Diabetes Father   . Heart disease Father   . Cancer Father   . Cancer Maternal Grandfather   . Cancer Paternal Grandfather     Social History Social History   Tobacco Use  . Smoking status: Never Smoker  . Smokeless tobacco: Never Used  Substance Use Topics  . Alcohol use: Yes    Alcohol/week: 6.0 standard drinks    Types: 6 Shots of liquor per week    Comment: 0.5 pint a day of liquor  . Drug use: No      Review of Systems Unable to get full review of systems due to patient's altered mental status due to alcohol intoxication ____________________________________________   PHYSICAL EXAM:  VITAL SIGNS: ED Triage Vitals  Enc Vitals Group     BP 04/11/19 2122 125/89     Pulse Rate 04/11/19 2122 79     Resp 04/11/19 2122 20     Temp 04/11/19 2122 98 F (36.7 C)     Temp Source 04/11/19 2122 Oral     SpO2 04/11/19 2122 97 %     Weight 04/11/19 2118 250 lb (113.4 kg)     Height --      Head Circumference --      Peak Flow --      Pain Score 04/11/19 2118 0     Pain Loc --      Pain Edu? --      Excl. in [place]? --     Constitutional: Alert and oriented.  Although slurring his words and moving around.  Appears to be under the influence Eyes: Conjunctivae are normal. EOMI. Head: Atraumatic. Nose: No congestion/rhinnorhea. Mouth/Throat: Mucous membranes are moist.   Neck: No stridor. Trachea Midline. FROM Cardiovascular: Normal rate, regular rhythm. Grossly normal heart sounds.  Good peripheral circulation.  No chest wall tenderness Respiratory: Normal respiratory effort.  No retractions. Lungs CTAB. Gastrointestinal: Soft and nontender. No distention. No abdominal bruits.   Musculoskeletal: No lower extremity tenderness nor edema.  No joint effusions. Neurologic: Appears to be under the influence.  No gross focal neurologic deficits are appreciated.  Moving all extremities well Skin:  Skin is warm, dry and intact. No rash noted. Psychiatric: Mood and affect are normal.  Appears to be under the influence GU: Deferred   ____________________________________________   LABS (all labs ordered are listed, but only abnormal results are displayed)  Labs Reviewed  CBC WITH DIFFERENTIAL/PLATELET - Abnormal; Notable for the following components:      Result Value   RBC 3.35 (*)    Hemoglobin 10.0 (*)    HCT 31.6 (*)    RDW 16.6 (*)    nRBC 0.5 (*)    All other components within normal limits  COMPREHENSIVE METABOLIC PANEL - Abnormal; Notable for the following  components:   CO2 19 (*)    Glucose, Bld 104 (*)    AST 85 (*)    ALT 47 (*)    All other components within normal limits  ETHANOL - Abnormal; Notable for the following components:   Alcohol, Ethyl (B) 495 (*)    All other components within normal limits  URINALYSIS, ROUTINE W REFLEX MICROSCOPIC - Abnormal; Notable for the following components:   Color, Urine COLORLESS (*)    APPearance CLEAR (*)    Specific Gravity, Urine 1.003 (*)    All other components within normal limits  LIPASE, BLOOD  CK  URINE DRUG SCREEN, QUALITATIVE ([facility] ONLY)  TROPONIN I (HIGH SENSITIVITY)  TROPONIN I (HIGH SENSITIVITY)   ____________________________________________   ED ECG REPORT I, Concha Se, the attending physician, personally viewed and interpreted this ECG.  EKG is sinus rate of 79, no ST elevations, no T wave inversions, normal intervals.  EKG has been read as complete heart block but patient has P waves before QRS is  Repeat EKG sinus rate of 72, no ST elevation, no T wave inversions, normal intervals ____________________________________________  RADIOLOGY   Official radiology report(s): CT Head  Wo Contrast  Result Date: 04/11/2019 CLINICAL DATA:  Found unresponsive, no known injury, large amount of ETOH EXAM: CT HEAD WITHOUT CONTRAST CT CERVICAL SPINE WITHOUT CONTRAST TECHNIQUE: Multidetector CT imaging of the head and cervical spine was performed following the standard protocol without intravenous contrast. Multiplanar CT image reconstructions of the cervical spine were also generated. COMPARISON:  CT head and cervical spine May 27, 2017 FINDINGS: CT HEAD FINDINGS Brain: No evidence of acute infarction, hemorrhage, hydrocephalus, extra-axial collection or mass lesion/mass effect. Vascular: No hyperdense vessel or unexpected calcification. Skull: Small amount of likely scarring in the left frontal soft tissues. No scalp swelling or hematoma. No calvarial fracture. Sinuses/Orbits: Minimal mural thickening in the ethmoids and right maxillary sinus. Remaining paranasal sinuses and mastoid air cells are predominantly clear. Included orbital structures are unremarkable. Other: None CT CERVICAL SPINE FINDINGS Alignment: Preservation of the normal cervical lordosis without traumatic listhesis. No abnormal facet widening. Normal alignment of the craniocervical and atlantoaxial articulations. Skull base and vertebrae: No acute fracture. No primary bone lesion or focal pathologic process. Soft tissues and spinal canal: No pre or paravertebral fluid or swelling. No visible canal hematoma. Disc levels: Multilevel intervertebral disc height loss with spondylitic endplate changes. Features are maximal C4-C6 with mild canal stenosis C4-5 and C5-6. There is mild-to-moderate neural foraminal narrowing on the left at C5-6 and bilaterally at C6-7. Upper chest: No acute abnormality in the upper chest or imaged lung apices. Other: Calcifications seen in the cervical carotids and proximal great vessels arising from the aortic arch. Normal thyroid IMPRESSION: 1. No evidence of acute intracranial pathology. 2. No fracture or  traumatic listhesis of the cervical spine. 3. Multilevel degenerative disc disease of the cervical spine. Detailed above. 4. Calcifications in the cervical carotids and proximal great vessels arising from the aortic arch. Electronically Signed   By: Kreg Shropshire M.D.   On: 04/11/2019 22:06   CT Cervical Spine Wo Contrast  Result Date: 04/11/2019 CLINICAL DATA:  Found unresponsive, no known injury, large amount of ETOH EXAM: CT HEAD WITHOUT CONTRAST CT CERVICAL SPINE WITHOUT CONTRAST TECHNIQUE: Multidetector CT imaging of the head and cervical spine was performed following the standard protocol without intravenous contrast. Multiplanar CT image reconstructions of the cervical spine were also generated. COMPARISON:  CT head and cervical spine May 27, 2017 FINDINGS: CT HEAD FINDINGS Brain: No evidence of acute infarction, hemorrhage, hydrocephalus, extra-axial collection or mass lesion/mass effect. Vascular: No hyperdense vessel or unexpected calcification. Skull: Small amount of likely scarring in the left frontal soft tissues. No scalp swelling or hematoma. No calvarial fracture. Sinuses/Orbits: Minimal mural thickening in the ethmoids and right maxillary sinus. Remaining paranasal sinuses and mastoid air cells are predominantly clear. Included orbital structures are unremarkable. Other: None CT CERVICAL SPINE FINDINGS Alignment: Preservation of the normal cervical lordosis without traumatic listhesis. No abnormal facet widening. Normal alignment of the craniocervical and atlantoaxial articulations. Skull base and vertebrae: No acute fracture. No primary bone lesion or focal pathologic process. Soft tissues and spinal canal: No pre or paravertebral fluid or swelling. No visible canal hematoma. Disc levels: Multilevel intervertebral disc height loss with spondylitic endplate changes. Features are maximal C4-C6 with mild canal stenosis C4-5 and C5-6. There is mild-to-moderate neural foraminal narrowing on the left  at C5-6 and bilaterally at C6-7. Upper chest: No acute abnormality in the upper chest or imaged lung apices. Other: Calcifications seen in the cervical carotids and proximal great vessels arising from the aortic arch. Normal thyroid IMPRESSION: 1. No evidence of acute intracranial pathology. 2. No fracture or traumatic listhesis of the cervical spine. 3. Multilevel degenerative disc disease of the cervical spine. Detailed above. 4. Calcifications in the cervical carotids and proximal great vessels arising from the aortic arch. Electronically Signed   By: Kreg Shropshire M.D.   On: 04/11/2019 22:06    ____________________________________________   PROCEDURES  Procedure(s) performed (including Critical Care):  Procedures   ____________________________________________   INITIAL IMPRESSION / ASSESSMENT AND PLAN / ED COURSE  Khamari Sheehan was evaluated in Emergency Department on 04/11/2019 for the symptoms described in the history of present illness. He was evaluated in the context of the global COVID-19 pandemic, which necessitated consideration that the patient might be at risk for infection with the SARS-CoV-2 virus that causes COVID-19. Institutional protocols and algorithms that pertain to the evaluation of patients at risk for COVID-19 are in a state of rapid change based on information released by regulatory bodies including the CDC and federal and state organizations. These policies and algorithms were followed during the patient's care in the ED.    Patient is a 59 year old who comes in intoxicated after being found down on the ground.  Will get labs to evaluate for electrolyte abnormalities, AKI, rhabdo.  Will get ethanol level.  Given patient was found on the ground will get CT head evaluate for intracranial hemorrhage and CT cervical evaluate for cervical fracture.  CT imaging was negative.  Hemoglobin around baseline.  Labs are reassuring except for significantly elevated ethanol level.   Patient is off to oncoming team pending sober reevaluation.         ____________________________________________   FINAL CLINICAL IMPRESSION(S) / ED DIAGNOSES   Final diagnoses:  Alcoholic intoxication without complication (HCC)      MEDICATIONS GIVEN DURING THIS VISIT:  Medications - No data to display   ED Discharge Orders    None       Note:  This document was prepared using Dragon voice recognition software and may include unintentional dictation errors.   Concha Se, MD 04/11/19 2312

## 2019-04-11 NOTE — ED Notes (Signed)
Fall alarm placed under pt at this time for safety. Alarm set to ring aloud. Pt asked to stay in bed and request staff if a need arises.

## 2019-04-12 ENCOUNTER — Telehealth: Payer: Self-pay

## 2019-04-12 LAB — TROPONIN I (HIGH SENSITIVITY): Troponin I (High Sensitivity): 5 ng/L (ref <18)

## 2019-04-12 NOTE — Telephone Encounter (Signed)
Patients capsule study has been rescheduled from tomorrow to next Thursday.  Pt said he thought he was having it this Thursday, and he was in the hospital last night-wasn't prepared to have it tomorrow.  Thanks,  [place], [place]

## 2019-04-12 NOTE — ED Notes (Signed)
Pt lying in bed asleep, eyes closed with equal unlabored RR. VS WNL. Fall alarm active and in place. Will continue to monitor, nothing needed from staff at this time

## 2019-04-12 NOTE — Discharge Instructions (Signed)
You were seen in the emergency department for alcohol intoxication.  Please seek help from the recommended resources for assistance with your alcohol dependence.  If you have any thoughts of hurting herself or others, please call 911 or return to the emergency department.  Please avoid drug and alcohol use.  Never drive a vehicle or operate machinery while intoxicated.  

## 2019-04-19 ENCOUNTER — Inpatient Hospital Stay: Payer: Medicare Other

## 2019-04-21 ENCOUNTER — Encounter: Admission: RE | Payer: Self-pay | Source: Home / Self Care

## 2019-04-21 ENCOUNTER — Ambulatory Visit: Admission: RE | Admit: 2019-04-21 | Payer: Medicare Other | Source: Home / Self Care | Admitting: Gastroenterology

## 2019-04-21 SURGERY — IMAGING PROCEDURE, GI TRACT, INTRALUMINAL, VIA CAPSULE

## 2019-05-24 ENCOUNTER — Other Ambulatory Visit: Payer: Self-pay

## 2019-05-24 ENCOUNTER — Inpatient Hospital Stay: Payer: Medicare Other | Attending: Hematology and Oncology

## 2019-05-24 DIAGNOSIS — E538 Deficiency of other specified B group vitamins: Secondary | ICD-10-CM | POA: Diagnosis not present

## 2019-05-24 MED ORDER — CYANOCOBALAMIN 1000 MCG/ML IJ SOLN
1000.0000 ug | Freq: Once | INTRAMUSCULAR | Status: AC
  Administered 2019-05-24: 14:00:00 1000 ug via INTRAMUSCULAR

## 2019-06-20 NOTE — Progress Notes (Signed)
[facility]  [address] [place], [place] [address] Phone: [phone]  Fax: [phone]   Clinic Day:  06/21/2019  Referring physician: [facility]  Chief Complaint: George Gomez is a 59 y.o. male with normocyticanemia and B12 deficiency who is seen for a 3 month assessment.   HPI:  The patient was last seen in the hematology clinic on 03/22/2019 for a telemedicine visit. At that time, he felt "pretty good".  Weight fluctuated.  Fluid intake was poor. He drank 1-2 beers/day.  Hematocrit was 34.3, hemoglobin 11.0, MCV 90.7, platelets 145,000, WBC 2700 with an ANC of 1100.  Ferritin was 1121 with an iron saturation of 50% and a TIBC of 280.  Sed rate was 60.  Sodium was 136 with a BUN of 43 and creatinine 1.94.     Hemochromatosis assay on 03/31/2019 revealed no mutations (C282Y, H63D, S65C). M spike was 0 gm/dL on 67/61/9509. Kappa free light chain was 39.7, lambda free light chain 39.1 and ratio 1.02 (normal) on 03/31/2019.  Patient was seen in the [facility] ER on 04/11/2019 with alcohol intoxication.  Head and cervical spine CT revealed no acute abnormalities.  Alcohol level was 495.  Hematocrit was 31.6, hemoglobin 10.0, MCV 94.3, platelets 156,000, WBC 4400 with an ANC of 2000.  BUN was 22 and creatinine 1.09.  AST was 85, ALT 47, bilirubin 1.1 and alkaline phosphatase 95.    During the interim, he has felt alright. The patient just woke up upon entering room. Reports intermittent pain and numbness in left hand x 2 weeks. He has a good appetite. He has right hip and knee pain. He has right knee swelling. His diabetes is ok. His is drinking more than 2 beers a day along with 1-2 shots of alcohol. Patient has not followed up with GI. Patient agreed to guaiac cards.    Past Medical History:  Diagnosis Date  . Anemia   . Diabetes mellitus without complication (HCC)   . Diverticulitis    Pt states diverticulitis  . EtOH dependence (HCC)   .  Hypercholesteremia   . Hypertension     Past Surgical History:  Procedure Laterality Date  . COLONOSCOPY WITH PROPOFOL N/A 10/16/2018   Procedure: COLONOSCOPY WITH PROPOFOL;  Surgeon: Toney Reil, MD;  Location: [facility] ENDOSCOPY;  Service: Gastroenterology;  Laterality: N/A;  . ENTEROSCOPY N/A 10/16/2018   Procedure: ENTEROSCOPY;  Surgeon: Toney Reil, MD;  Location: [facility] ENDOSCOPY;  Service: Gastroenterology;  Laterality: N/A;  . ESOPHAGOGASTRODUODENOSCOPY N/A 07/02/2018   Procedure: ESOPHAGOGASTRODUODENOSCOPY (EGD);  Surgeon: Toney Reil, MD;  Location: [facility] ENDOSCOPY;  Service: Gastroenterology;  Laterality: N/A;  . ESOPHAGOGASTRODUODENOSCOPY (EGD) WITH PROPOFOL N/A 06/01/2017   Procedure: ESOPHAGOGASTRODUODENOSCOPY (EGD) WITH PROPOFOL;  Surgeon: Wyline Mood, MD;  Location: [facility] ENDOSCOPY;  Service: Gastroenterology;  Laterality: N/A;  . GIVENS CAPSULE STUDY N/A 10/17/2018   Procedure: GIVENS CAPSULE STUDY;  Surgeon: Toney Reil, MD;  Location: [facility] ENDOSCOPY;  Service: Gastroenterology;  Laterality: N/A;  . none      Family History  Problem Relation Age of Onset  . Rheum arthritis Mother   . Diabetes Father   . Heart disease Father   . Cancer Father   . Cancer Maternal Grandfather   . Cancer Paternal Grandfather     Social History:  reports that he has never smoked. He has never used smokeless tobacco. He reports current alcohol use of about 6.0 standard drinks of alcohol per week. He reports that  he does not use drugs. He drankliquor 2-3 x/day. He is drinking less than he did in the past (moonshine), about 2-3 shots daily. He did "crack" 10-15 years ago. He lives in [place]. Is no longer socially drinking, He says that if can overcome doing crack he can overcome drinking alcohol. The patient is alone today.   Allergies: No Known Allergies  Current Medications: Current Outpatient Medications  Medication Sig Dispense Refill  . allopurinol  (ZYLOPRIM) 300 MG tablet Take 300 mg by mouth daily.     Marland Kitchen amLODipine (NORVASC) 10 MG tablet Take 10 mg by mouth daily.    Marland Kitchen atorvastatin (LIPITOR) 40 MG tablet Take 40 mg by mouth daily.     Marland Kitchen gabapentin (NEURONTIN) 300 MG capsule Take 300 mg by mouth 3 (three) times daily.   3  . losartan (COZAAR) 100 MG tablet Take 100 mg by mouth daily.    . metFORMIN (GLUCOPHAGE-XR) 500 MG 24 hr tablet Take 1,000 mg by mouth daily.    . Multiple Vitamin (MULTIVITAMIN ADULT PO) Gnp Century Mature Multivitamin Tab 125c    . naltrexone (DEPADE) 50 MG tablet Take 50 mg by mouth daily.     . pantoprazole (PROTONIX) 40 MG tablet Take 1 tablet (40 mg total) by mouth daily. 30 tablet 1  . propranolol ER (INDERAL LA) 60 MG 24 hr capsule Take 60 mg by mouth daily.      No current facility-administered medications for this visit.    Review of Systems  Constitutional: Positive for weight loss (7 lbs). Negative for chills, diaphoresis, fever and malaise/fatigue.       Feels alright.  HENT: Negative.  Negative for congestion, ear pain, hearing loss, nosebleeds, sinus pain and sore throat.   Eyes: Negative.  Negative for blurred vision and double vision.  Respiratory: Negative.  Negative for cough, hemoptysis and shortness of breath.   Cardiovascular: Positive for leg swelling (right knee). Negative for chest pain, palpitations and orthopnea.  Gastrointestinal: Negative for abdominal pain, blood in stool, constipation, diarrhea, heartburn, melena, nausea and vomiting.       Good appetite.  Genitourinary: Negative for dysuria, frequency and urgency.       H/o kidney stones- no recent symptoms.  Musculoskeletal: Negative for back pain, falls, joint pain (chronic, right hip and knee) and myalgias.  Skin: Negative.  Negative for itching and rash.  Neurological: Positive for sensory change (intermittent pain and numbness in left hand x 2 weeks). Negative for dizziness, tingling, speech change, focal weakness, weakness  and headaches.  Endo/Heme/Allergies: Does not bruise/bleed easily.       Diabetes is ok.  Psychiatric/Behavioral: Positive for substance abuse (alcohol; 1-2 beers/day). Negative for depression and memory loss. The patient is not nervous/anxious and does not have insomnia.   All other systems reviewed and are negative.   Performance status (ECOG):  0  Vitals: Blood pressure 106/63, pulse 83, temperature (!) 97.4 F (36.3 C), temperature source Tympanic, resp. rate 16, weight 214 lb 11.7 oz (97.4 kg), SpO2 100 %.   Physical Exam  Constitutional: He is oriented to person, place, and time. He appears well-developed and well-nourished. No distress.  HENT:  Head: Normocephalic and atraumatic.  Mouth/Throat: Oropharynx is clear and moist. No oropharyngeal exudate.  Cap and mask.  Eyes: Pupils are equal, round, and reactive to light. Conjunctivae and EOM are normal. No scleral icterus.  Blood shot eyes from waking up upon me entering the room.  Cardiovascular: Normal rate, regular rhythm and normal  heart sounds.  No murmur heard. Pulmonary/Chest: Effort normal and breath sounds normal. No respiratory distress. He has no wheezes. He has no rales. He exhibits no tenderness.  Abdominal: Soft. Bowel sounds are normal. He exhibits no distension and no mass. There is no abdominal tenderness. There is no rebound and no guarding.  Musculoskeletal:        General: No tenderness or edema. Normal range of motion.     Cervical back: Normal range of motion and neck supple.  Lymphadenopathy:    He has no cervical adenopathy.  Neurological: He is alert and oriented to person, place, and time.  Skin: Skin is warm and dry. He is not diaphoretic.  Psychiatric: He has a normal mood and affect. His behavior is normal. Judgment and thought content normal.  Nursing note and vitals reviewed.   Appointment on 06/21/2019  Component Date Value Ref Range Status  . WBC 06/21/2019 3.3* 4.0 - 10.5 K/uL Final  . RBC  06/21/2019 2.94* 4.22 - 5.81 MIL/uL Final  . Hemoglobin 06/21/2019 9.0* 13.0 - 17.0 g/dL Final  . HCT 54/09/811905/25/2021 28.3* 39.0 - 52.0 % Final  . MCV 06/21/2019 96.3  80.0 - 100.0 fL Final  . MCH 06/21/2019 30.6  26.0 - 34.0 pg Final  . MCHC 06/21/2019 31.8  30.0 - 36.0 g/dL Final  . RDW 14/78/295605/25/2021 15.9* 11.5 - 15.5 % Final  . Platelets 06/21/2019 145* 150 - 400 K/uL Final  . nRBC 06/21/2019 0.6* 0.0 - 0.2 % Final  . Neutrophils Relative % 06/21/2019 55  % Final  . Neutro Abs 06/21/2019 1.8  1.7 - 7.7 K/uL Final  . Lymphocytes Relative 06/21/2019 24  % Final  . Lymphs Abs 06/21/2019 0.8  0.7 - 4.0 K/uL Final  . Monocytes Relative 06/21/2019 17  % Final  . Monocytes Absolute 06/21/2019 0.6  0.1 - 1.0 K/uL Final  . Eosinophils Relative 06/21/2019 2  % Final  . Eosinophils Absolute 06/21/2019 0.1  0.0 - 0.5 K/uL Final  . Basophils Relative 06/21/2019 1  % Final  . Basophils Absolute 06/21/2019 0.0  0.0 - 0.1 K/uL Final  . Immature Granulocytes 06/21/2019 1  % Final  . Abs Immature Granulocytes 06/21/2019 0.04  0.00 - 0.07 K/uL Final   Performed at [facility], [address] [place], [place] [id number]    Assessment:  Esmeralda ArthurMichael Bertagnolli is a 59 y.o. male with mild pancytopenia. CBCs from 07/31/2013 - 09/03/2017 reveal a hemoglobinfrom 10.1 - 14.7 without trend. MCV has been normal. Plateletshave ranged between 111,000 - 190,000. WBChas ranged between 2000 - 8300. WBC has been consistently low since 06/01/2017. He drinks alcoholdaily.  CBC on 08/08/2019revealed a hematocrit of 35.4, hemoglobin 11.5, MCV 94.5, platelets 149,000, WBC 2000 with an ANC of 1000. Differential included 49% segs, 34% lymphocytes, 14% monocytes, 2% eosinophils, and 1% basophils. Outside labsnote negative/normal peripheral smear, LDH, and SPEP/UPEP.  Work-up on 11/07/2019revealed a hematocrit of 29.6, hemoglobin 9.3, MCV 97.7, WBC 2600 with an ANC of 1200. Differential included 48% segs, 36%  lymphs, 14% monocytes, 1% eosinophils, and 1% basophils. . B12was 332 (low normal). Normal studiesincluded: Creatinine (1.05), LFTs, ferritin (257), iron studies, folate (7.4), TSH, copper, zinc, ANA, hepatitis B core antibody, hepatitis B surface antigen, hepatitis C antibody, and HIV testing. Retic was 2.2%. Peripheral smearrevealed no abnormalities. Flow cytometry on 01/23/2019 was negative.  He has B12 deficiencyandfolate deficiency. He began B12 injections on 12/11/2017 (last 05/24/2019). Folatewas 4.1 on 07/13/2018 and 8.9 on 08/10/2018.  Abdomen and pelvic CTon 05/30/2017 revealed hepatic steatosis. Spleen was normal.  He has a history of a GI bleedon 05/30/2017. EGDon 06/01/2017 revealed a normal esophagus, normal stomach, and duodenitis. Biopsy revealed reactive duodenitis without dysplasia or malignancy. Colonoscopyon 10/16/2018 revealed diverticulosis in the entire examined colon. The distal rectum and anal verge were normal on retroflexion view. There was normal mucosa in the entire examined colon. Small bowel enteroscopyon 09/19/2020was entirely normal (esophagus, stomach, duodenum, proximal and mid-jejunum).Capsule endoscopywas sub-optimal.  [facility].EGDon 07/02/2018 revealedalcoholic gastritis but no evidence of acute bleeding. Stools were heme positive. Herequireda PRBCtransfusion. Hemoglobinrangedbetween 8.4 - 8.7. He was discharged with PPI twice daily.  [facility] with nausea, vomiting and diarrhea.He had been drinking alcohol. Creatininewas 2.61 (baseline 0.83). He was dehydrated. He received IV fluids and electrolyte replacement. Creatinine was 1.77 on09/16/2020.Hemoglobin was 8.5.  [facility] with symptomatic anemia. He underwent small bowel enteroscopy and colonoscopy. He received 2  units of PRBCsand Venofer(400 mg).  He was seen in the [facility] ER on 04/11/2019 with alcohol intoxication.   Symptomatically, he feels alright.  He is drinking more than 2 beers a day along with 1-2 shots of alcohol.  Exam is stable.  Plan: 1.   Labs today: CBC with diff, CMP, ferritin. 2.Normocytic anemia Hematocrit28.3. Hemoglobin9.0. MCV96.3 on 06/21/2019. Hehas a history of a GI bleed secondary to alcoholic gastritiswith associated melena. EGD and colonoscopyin 09/2020revealed no source of GI bleeding. Capsule endoscopy was sub-optimal. He has increased his alcohol intake during the interim. Encourage patient to cut back on alcohol as it is affecting his counts. 3.  B12 deficiency B12 was 593 on 07/01/2018. He receives B12 monthly (last 05/24/2019). RTC monthly x 6 for B12. Add folate to today's labs. 4. Upper GI bleed Patient has a history of GI bleed in 05/2017 and 06/2018. EGD and colonoscopy  revealed no source of GI bleeding. Capsule endoscopy was sub-optimal.                        Anticipate repeat testing.  Guaiac cards x3  Patient to follow-up with Dr. Maximino Greenland. 5. Renal insufficiency Creatinine2.61 on 10/12/2018 with dehydration. Creatinine0.88on 10/15/2018.             Creatinine 1.47 on 01/20/2019.  Creatinine 1.94 on 03/21/2019.  Creatinine 1.60 on 06/21/2019. Creatinine has fluctuated between 0.98 - 1.78 in the past year. M spike was 0 gm/dL and free light chain ratio normal on 03/31/2019.   Follow-up with nephrology. 6. Weight loss Weight has decreased with increased alcohol intake.  Patient notes weight fluctuates from 207 pounds to 217 pounds.  10 you to monitor 7.   Abnormal iron saturation  Ferritin was 1121 with an  iron saturation of 50% on 03/21/2019.  Prior iron saturations 16-45%.  Ferritin fluctuating between [phone] since 12/03/2017.  Suspect acute phase reactant.  Hemochromatosis assay on 03/31/2019 revealed no mutations (C282Y, H63D, S65C). 8.   Please schedule follow-up appt with Dr Maximino Greenland. 9.   RTC in 1 month for labs (CBC with diff, ferritin, retic) and B12. 10.   RTC in 2 months for MD assessment, labs (CBC with diff +/- others), and B12..  I discussed the assessment and treatment plan with the patient.  The patient was provided an opportunity to ask questions and all were answered.  The patient agreed with the plan and demonstrated an understanding of the  instructions.  The patient was advised to call back or seek an in person evaluation if the symptoms worsen or if the condition fails to improve as anticipated.   Nelva Nay, MD, PhD  06/21/2019, 11:25 AM  I, Theador Hawthorne, am acting as scribe for [employer]. Merlene Pulling, MD, PhD.  I, Devlyn Retter C. Merlene Pulling, MD, have reviewed the above documentation for accuracy and completeness, and I agree with the above.

## 2019-06-21 ENCOUNTER — Other Ambulatory Visit: Payer: Self-pay

## 2019-06-21 ENCOUNTER — Inpatient Hospital Stay (HOSPITAL_BASED_OUTPATIENT_CLINIC_OR_DEPARTMENT_OTHER): Payer: Medicare Other | Admitting: Hematology and Oncology

## 2019-06-21 ENCOUNTER — Inpatient Hospital Stay: Payer: Medicare Other | Attending: Hematology and Oncology

## 2019-06-21 ENCOUNTER — Telehealth: Payer: Self-pay

## 2019-06-21 ENCOUNTER — Inpatient Hospital Stay: Payer: Medicare Other

## 2019-06-21 VITALS — BP 106/63 | HR 83 | Temp 97.4°F | Resp 16 | Wt 214.7 lb

## 2019-06-21 DIAGNOSIS — D649 Anemia, unspecified: Secondary | ICD-10-CM

## 2019-06-21 DIAGNOSIS — E538 Deficiency of other specified B group vitamins: Secondary | ICD-10-CM

## 2019-06-21 DIAGNOSIS — K922 Gastrointestinal hemorrhage, unspecified: Secondary | ICD-10-CM | POA: Insufficient documentation

## 2019-06-21 DIAGNOSIS — R790 Abnormal level of blood mineral: Secondary | ICD-10-CM | POA: Diagnosis not present

## 2019-06-21 DIAGNOSIS — N289 Disorder of kidney and ureter, unspecified: Secondary | ICD-10-CM | POA: Diagnosis not present

## 2019-06-21 LAB — CBC WITH DIFFERENTIAL/PLATELET
Abs Immature Granulocytes: 0.04 10*3/uL (ref 0.00–0.07)
Basophils Absolute: 0 10*3/uL (ref 0.0–0.1)
Basophils Relative: 1 %
Eosinophils Absolute: 0.1 10*3/uL (ref 0.0–0.5)
Eosinophils Relative: 2 %
HCT: 28.3 % — ABNORMAL LOW (ref 39.0–52.0)
Hemoglobin: 9 g/dL — ABNORMAL LOW (ref 13.0–17.0)
Immature Granulocytes: 1 %
Lymphocytes Relative: 24 %
Lymphs Abs: 0.8 10*3/uL (ref 0.7–4.0)
MCH: 30.6 pg (ref 26.0–34.0)
MCHC: 31.8 g/dL (ref 30.0–36.0)
MCV: 96.3 fL (ref 80.0–100.0)
Monocytes Absolute: 0.6 10*3/uL (ref 0.1–1.0)
Monocytes Relative: 17 %
Neutro Abs: 1.8 10*3/uL (ref 1.7–7.7)
Neutrophils Relative %: 55 %
Platelets: 145 10*3/uL — ABNORMAL LOW (ref 150–400)
RBC: 2.94 MIL/uL — ABNORMAL LOW (ref 4.22–5.81)
RDW: 15.9 % — ABNORMAL HIGH (ref 11.5–15.5)
WBC: 3.3 10*3/uL — ABNORMAL LOW (ref 4.0–10.5)
nRBC: 0.6 % — ABNORMAL HIGH (ref 0.0–0.2)

## 2019-06-21 LAB — FERRITIN: Ferritin: 1360 ng/mL — ABNORMAL HIGH (ref 24–336)

## 2019-06-21 LAB — COMPREHENSIVE METABOLIC PANEL
ALT: 56 U/L — ABNORMAL HIGH (ref 0–44)
AST: 83 U/L — ABNORMAL HIGH (ref 15–41)
Albumin: 4 g/dL (ref 3.5–5.0)
Alkaline Phosphatase: 116 U/L (ref 38–126)
Anion gap: 12 (ref 5–15)
BUN: 25 mg/dL — ABNORMAL HIGH (ref 8–23)
CO2: 22 mmol/L (ref 22–32)
Calcium: 9.3 mg/dL (ref 8.9–10.3)
Chloride: 99 mmol/L (ref 98–111)
Creatinine, Ser: 1.6 mg/dL — ABNORMAL HIGH (ref 0.61–1.24)
GFR calc Af Amer: 53 mL/min — ABNORMAL LOW (ref >60)
GFR calc non Af Amer: 46 mL/min — ABNORMAL LOW (ref >60)
Glucose, Bld: 150 mg/dL — ABNORMAL HIGH (ref 70–99)
Potassium: 3.6 mmol/L (ref 3.5–5.1)
Sodium: 133 mmol/L — ABNORMAL LOW (ref 135–145)
Total Bilirubin: 0.4 mg/dL (ref 0.3–1.2)
Total Protein: 7.7 g/dL (ref 6.5–8.1)

## 2019-06-21 LAB — FOLATE: Folate: 15.9 ng/mL (ref >5.9)

## 2019-06-21 MED ORDER — CYANOCOBALAMIN 1000 MCG/ML IJ SOLN
1000.0000 ug | Freq: Once | INTRAMUSCULAR | Status: AC
  Administered 2019-06-21: 1000 ug via INTRAMUSCULAR

## 2019-06-21 NOTE — Telephone Encounter (Signed)
-----   Message from Rosey Bath, MD sent at 06/21/2019 12:52 PM EDT ----- Regarding: Please forward to his PCP  ----- Message ----- From: Interface, Lab In [place] Sent: 06/21/2019  10:47 AM EDT To: Rosey Bath, MD

## 2019-06-21 NOTE — Telephone Encounter (Signed)
Faxed lab work to [facility]

## 2019-06-21 NOTE — Progress Notes (Signed)
Patient would like dr Merlene Pulling to look at his left hand due to off and on pain and numbness that has been going on for 2 weeks

## 2019-06-22 ENCOUNTER — Telehealth: Payer: Self-pay

## 2019-06-22 NOTE — Telephone Encounter (Signed)
Called patient and he states that he sees Dr Orvan Falconer (pcp at [facility]) next month as well as GI. I sent lab results over to Dr Orvan Falconer

## 2019-06-22 NOTE — Telephone Encounter (Signed)
-----   Message from Rosey Bath, MD sent at 06/22/2019 10:27 AM EDT ----- Regarding: RE: pcp  Can you ask the patient to follow-up with GI?  Maybe we can make the appt for him if he agrees.  M ----- Message ----- From: Lesle Chris, RN Sent: 06/22/2019   8:44 AM EDT To: Rosey Bath, MD Subject: pcp                                            I sent his labs to his PCP and this is the response I got. Looks like the patient has no other PCP.  I can call the patient and tell him to make an appointment with PCP if you would like. ----- Message ----- From: Toney Reil, MD Sent: 06/21/2019   4:37 PM EDT To: Lesle Chris, RN  Thank youHe has not seen me for 1 year.  I also recommended video capsule endoscopy which he did not undergo.  He need to see me in my office for further evaluation if needed  Rohini Vanga ----- Message ----- From: Lesle Chris, RN Sent: 06/21/2019   1:26 PM EDT To: Toney Reil, MD  Please see todays labwork, thanks

## 2019-06-30 ENCOUNTER — Other Ambulatory Visit: Payer: Self-pay

## 2019-06-30 DIAGNOSIS — D649 Anemia, unspecified: Secondary | ICD-10-CM

## 2019-06-30 LAB — OCCULT BLOOD X 1 CARD TO LAB, STOOL
Fecal Occult Bld: NEGATIVE
Fecal Occult Bld: NEGATIVE
Fecal Occult Bld: NEGATIVE

## 2019-07-14 ENCOUNTER — Other Ambulatory Visit: Payer: Self-pay

## 2019-07-14 ENCOUNTER — Encounter: Payer: Self-pay | Admitting: Gastroenterology

## 2019-07-14 ENCOUNTER — Ambulatory Visit (INDEPENDENT_AMBULATORY_CARE_PROVIDER_SITE_OTHER): Payer: Medicare Other | Admitting: Gastroenterology

## 2019-07-14 VITALS — BP 128/76 | HR 116 | Temp 98.7°F | Ht 71.0 in | Wt 215.5 lb

## 2019-07-14 DIAGNOSIS — R066 Hiccough: Secondary | ICD-10-CM

## 2019-07-14 DIAGNOSIS — D509 Iron deficiency anemia, unspecified: Secondary | ICD-10-CM

## 2019-07-14 DIAGNOSIS — D649 Anemia, unspecified: Secondary | ICD-10-CM | POA: Diagnosis not present

## 2019-07-14 NOTE — Progress Notes (Signed)
George Darby, MD [address]  [place]  [place], [place] [address]  Main: [phone]  Fax: [phone] Pager: [phone]   Primary Care Physician: [facility]  Primary Gastroenterologist:  Dr. Vicente Males  Chief Complaint  Patient presents with  . Dysphagia    states he feels like there is something is stuck in throat sometimes and it makes it hard to breath   . Diarrhea    Had diarrhea yesterday but it is cleared up today   . Rectal Bleeding    Had blood on toliet paper yesterday when he had diarrhea   . Anemia    Patient has been fatigue some     HPI: George Gomez is a 59 y.o. male with history of alcohol abuse, pancytopenia, metabolic syndrome, mild CKD.  Patient is closely followed by Dr. Mike Gip for pancytopenia.  Patient was originally seen by GI when he was admitted with worsening anemia and melena in 05/2017.  At that time, he underwent upper endoscopy which revealed duodenitis only.  He also had a colonoscopy at [facility] in 2016, 1 tubular adenoma removed.  He underwent repeat colonoscopy as well as small bowel endoscopy in 09/2018 which was unremarkable except for colonic diverticulosis.  Small bowel capsule was inconclusive.  Patient was advised to undergo repeat capsule which he did not yet.  Today, patient reports that he has significantly cut down on alcohol to 3 beers per day.  He reports feeling better overall.  Does report feeling tired.  He denies black stools, rectal bleeding, abdominal pain.  He does report hiccups with catching of breath.  He has history of GERD for which he takes Protonix 40 mg daily.  Patient does not have iron deficiency, B12 deficiency or folate deficiency  Current Outpatient Medications  Medication Sig Dispense Refill  . allopurinol (ZYLOPRIM) 300 MG tablet Take 300 mg by mouth daily.     Marland Kitchen amLODipine (NORVASC) 10 MG tablet Take 10 mg by mouth daily.    Marland Kitchen atorvastatin (LIPITOR) 40 MG tablet Take 40 mg by mouth  daily.     Marland Kitchen gabapentin (NEURONTIN) 300 MG capsule Take 300 mg by mouth 3 (three) times daily.   3  . losartan (COZAAR) 100 MG tablet Take 100 mg by mouth daily.    . metFORMIN (GLUCOPHAGE-XR) 500 MG 24 hr tablet Take 1,000 mg by mouth daily.    . Multiple Vitamin (MULTIVITAMIN ADULT PO) Gnp Century Mature Multivitamin Tab 125c    . naltrexone (DEPADE) 50 MG tablet Take 50 mg by mouth daily.     . pantoprazole (PROTONIX) 40 MG tablet Take 1 tablet (40 mg total) by mouth daily. 30 tablet 1  . propranolol ER (INDERAL LA) 60 MG 24 hr capsule Take 60 mg by mouth daily.      No current facility-administered medications for this visit.    Allergies as of 07/14/2019  . (No Known Allergies)    NSAIDs: None  Antiplts/Anticoagulants/Anti thrombotics: None  GI procedures:  Upper endoscopy 07/02/2018 Duodenitis, no ulcer DIAGNOSIS:  A. DUODENAL BULB; COLD BIOPSY:  - DUODENAL MUCOSA WITH HEALING MUCOSAL INJURY (REACTIVE DUODENITIS).  - NEGATIVE FOR VIRAL CYTOPATHIC EFFECT, PARASITE, DYSPLASIA, AND  MALIGNANCY.   Repeat upper endoscopy 07/02/2018 DIAGNOSIS:  A. STOMACH, RANDOM; BIOPSIES:  - FINDINGS CONSISTENT WITH REACTIVE GASTROPATHY.  - NEGATIVE FOR INTESTINAL METAPLASIA, DYSPLASIA AND MALIGNANCY.   Small bowel endoscopy 10/16/2018 Normal  Colonoscopy 10/16/2018 Fair bowel prep Sigmoid diverticulosis Normal colon mucosa  ROS:  General: Negative for anorexia, weight loss, fever, chills, fatigue, weakness. ENT: Negative for hoarseness, difficulty swallowing , nasal congestion. CV: Negative for chest pain, angina, palpitations, dyspnea on exertion, peripheral edema.  Respiratory: Negative for dyspnea at rest, dyspnea on exertion, cough, sputum, wheezing.  GI: See history of present illness. GU:  Negative for dysuria, hematuria, urinary incontinence, urinary frequency, nocturnal urination.  Endo: Negative for unusual weight change.    Physical Examination:   BP 128/76 (BP Location:  Left Arm, Patient Position: Sitting, Cuff Size: Normal)   Pulse (!) 116   Temp 98.7 F (37.1 C) (Oral)   Ht 5\' 11"  (1.803 m)   Wt 215 lb 8 oz (97.8 kg)   BMI 30.06 kg/m   General: Well-nourished, well-developed in no acute distress.  Eyes: No icterus. Conjunctivae pink. Mouth: Oropharyngeal mucosa moist and pink , no lesions erythema or exudate. Lungs: Clear to auscultation bilaterally. Non-labored. Heart: Regular rate and rhythm, no murmurs rubs or gallops.  Abdomen: Bowel sounds are normal, nontender, nondistended, no hepatosplenomegaly or masses, no hernia , no rebound or guarding.   Extremities: No lower extremity edema. No clubbing or deformities. Neuro: Alert and oriented x 3.  Grossly intact. Skin: Warm and dry, no jaundice.   Psych: Alert and cooperative, normal mood and affect.   Imaging Studies: No results found.  Assessment and Plan:   George Gomez is a 59 y.o. male metabolic syndrome, CKD, history of alcohol abuse is seen in consultation for anemia, severe hiccups  Normocytic anemia, his iron studies are consistent with anemia of chronic disease No evidence of B12 or folate deficiency Given his history of black stools, recommend video capsule endoscopy.  If this is negative, do not recommend further GI work-up unless he has active GI bleed Complete abstinence from alcohol use  Intractable hiccups Increase pantoprazole 40 mg 2 times a day  Follow up after above work-up   Dr 77, MD

## 2019-07-19 ENCOUNTER — Inpatient Hospital Stay: Payer: Medicare Other

## 2019-07-19 ENCOUNTER — Ambulatory Visit: Payer: Medicare Other | Admitting: Hematology and Oncology

## 2019-07-19 ENCOUNTER — Inpatient Hospital Stay: Payer: Medicare Other | Attending: Hematology and Oncology

## 2019-07-19 DIAGNOSIS — E538 Deficiency of other specified B group vitamins: Secondary | ICD-10-CM | POA: Insufficient documentation

## 2019-07-21 ENCOUNTER — Inpatient Hospital Stay: Payer: Medicare Other

## 2019-07-21 ENCOUNTER — Other Ambulatory Visit: Payer: Self-pay

## 2019-07-21 DIAGNOSIS — E538 Deficiency of other specified B group vitamins: Secondary | ICD-10-CM

## 2019-07-21 DIAGNOSIS — R790 Abnormal level of blood mineral: Secondary | ICD-10-CM

## 2019-07-21 DIAGNOSIS — D649 Anemia, unspecified: Secondary | ICD-10-CM

## 2019-07-21 LAB — CBC WITH DIFFERENTIAL/PLATELET
Abs Immature Granulocytes: 0.03 10*3/uL (ref 0.00–0.07)
Basophils Absolute: 0 10*3/uL (ref 0.0–0.1)
Basophils Relative: 1 %
Eosinophils Absolute: 0.1 10*3/uL (ref 0.0–0.5)
Eosinophils Relative: 3 %
HCT: 27.3 % — ABNORMAL LOW (ref 39.0–52.0)
Hemoglobin: 8.8 g/dL — ABNORMAL LOW (ref 13.0–17.0)
Immature Granulocytes: 1 %
Lymphocytes Relative: 43 %
Lymphs Abs: 1.3 10*3/uL (ref 0.7–4.0)
MCH: 30.7 pg (ref 26.0–34.0)
MCHC: 32.2 g/dL (ref 30.0–36.0)
MCV: 95.1 fL (ref 80.0–100.0)
Monocytes Absolute: 0.5 10*3/uL (ref 0.1–1.0)
Monocytes Relative: 15 %
Neutro Abs: 1.1 10*3/uL — ABNORMAL LOW (ref 1.7–7.7)
Neutrophils Relative %: 37 %
Platelets: 137 10*3/uL — ABNORMAL LOW (ref 150–400)
RBC: 2.87 MIL/uL — ABNORMAL LOW (ref 4.22–5.81)
RDW: 15.3 % (ref 11.5–15.5)
WBC: 3.1 10*3/uL — ABNORMAL LOW (ref 4.0–10.5)
nRBC: 0 % (ref 0.0–0.2)

## 2019-07-21 LAB — RETICULOCYTES
Immature Retic Fract: 24.7 % — ABNORMAL HIGH (ref 2.3–15.9)
RBC.: 2.86 MIL/uL — ABNORMAL LOW (ref 4.22–5.81)
Retic Count, Absolute: 62.3 10*3/uL (ref 19.0–186.0)
Retic Ct Pct: 2.2 % (ref 0.4–3.1)

## 2019-07-21 LAB — FERRITIN: Ferritin: 654 ng/mL — ABNORMAL HIGH (ref 24–336)

## 2019-07-21 MED ORDER — CYANOCOBALAMIN 1000 MCG/ML IJ SOLN
1000.0000 ug | Freq: Once | INTRAMUSCULAR | Status: AC
  Administered 2019-07-21: 1000 ug via INTRAMUSCULAR

## 2019-07-27 ENCOUNTER — Other Ambulatory Visit: Payer: Self-pay

## 2019-07-27 ENCOUNTER — Emergency Department
Admission: EM | Admit: 2019-07-27 | Discharge: 2019-07-27 | Disposition: A | Discharge status: Home / Self Care | Payer: Medicare Other | Attending: Emergency Medicine | Admitting: Emergency Medicine

## 2019-07-27 ENCOUNTER — Encounter: Payer: Self-pay | Admitting: *Deleted

## 2019-07-27 DIAGNOSIS — R195 Other fecal abnormalities: Secondary | ICD-10-CM | POA: Diagnosis not present

## 2019-07-27 DIAGNOSIS — Z5321 Procedure and treatment not carried out due to patient leaving prior to being seen by health care provider: Secondary | ICD-10-CM | POA: Insufficient documentation

## 2019-07-27 DIAGNOSIS — M549 Dorsalgia, unspecified: Secondary | ICD-10-CM | POA: Insufficient documentation

## 2019-07-27 LAB — COMPREHENSIVE METABOLIC PANEL
ALT: 25 U/L (ref 0–44)
AST: 55 U/L — ABNORMAL HIGH (ref 15–41)
Albumin: 4.1 g/dL (ref 3.5–5.0)
Alkaline Phosphatase: 84 U/L (ref 38–126)
Anion gap: 14 (ref 5–15)
BUN: 31 mg/dL — ABNORMAL HIGH (ref 8–23)
CO2: 22 mmol/L (ref 22–32)
Calcium: 8.6 mg/dL — ABNORMAL LOW (ref 8.9–10.3)
Chloride: 105 mmol/L (ref 98–111)
Creatinine, Ser: 2.11 mg/dL — ABNORMAL HIGH (ref 0.61–1.24)
GFR calc Af Amer: 38 mL/min — ABNORMAL LOW (ref >60)
GFR calc non Af Amer: 33 mL/min — ABNORMAL LOW (ref >60)
Glucose, Bld: 91 mg/dL (ref 70–99)
Potassium: 3.7 mmol/L (ref 3.5–5.1)
Sodium: 141 mmol/L (ref 135–145)
Total Bilirubin: 0.7 mg/dL (ref 0.3–1.2)
Total Protein: 7.8 g/dL (ref 6.5–8.1)

## 2019-07-27 LAB — CBC
HCT: 26.7 % — ABNORMAL LOW (ref 39.0–52.0)
Hemoglobin: 9.2 g/dL — ABNORMAL LOW (ref 13.0–17.0)
MCH: 31.3 pg (ref 26.0–34.0)
MCHC: 34.5 g/dL (ref 30.0–36.0)
MCV: 90.8 fL (ref 80.0–100.0)
Platelets: 132 10*3/uL — ABNORMAL LOW (ref 150–400)
RBC: 2.94 MIL/uL — ABNORMAL LOW (ref 4.22–5.81)
RDW: 15.2 % (ref 11.5–15.5)
WBC: 4 10*3/uL (ref 4.0–10.5)
nRBC: 0 % (ref 0.0–0.2)

## 2019-07-27 LAB — TYPE AND SCREEN
ABO/RH(D): O POS
Antibody Screen: NEGATIVE

## 2019-07-27 LAB — ETHANOL: Alcohol, Ethyl (B): 363 mg/dL (ref <10)

## 2019-07-27 NOTE — ED Triage Notes (Addendum)
Pt to ED reporting dark stool for the past week with a  Hx of GI bleeding and needing blood transfusions. Pt is seen at the cancer center for blood checks and monitoring of his hgb. Pt reports they originally thought he had leukemia but was recently told he does not.   Pt also reports back pain.  ETOH use daily

## 2019-07-27 NOTE — ED Notes (Signed)
Pt brought in by [facility] for abd pain x 2 weeks. VSWNL, cbg 83.

## 2019-07-28 ENCOUNTER — Telehealth: Payer: Self-pay | Admitting: Emergency Medicine

## 2019-07-28 NOTE — Telephone Encounter (Signed)
Called patient due to lwot to inquire about condition and follow up plans.  No answer and no voicemail  

## 2019-08-02 ENCOUNTER — Ambulatory Visit
Admission: RE | Admit: 2019-08-02 | Discharge: 2019-08-02 | Disposition: A | Discharge status: Home / Self Care | Payer: Medicare Other | Attending: Gastroenterology | Admitting: Gastroenterology

## 2019-08-02 ENCOUNTER — Encounter
Admission: RE | Disposition: A | Discharge status: Home / Self Care | Payer: Self-pay | Source: Home / Self Care | Attending: Gastroenterology

## 2019-08-02 ENCOUNTER — Other Ambulatory Visit: Payer: Self-pay

## 2019-08-02 DIAGNOSIS — D509 Iron deficiency anemia, unspecified: Secondary | ICD-10-CM | POA: Insufficient documentation

## 2019-08-02 DIAGNOSIS — Z8719 Personal history of other diseases of the digestive system: Secondary | ICD-10-CM

## 2019-08-02 HISTORY — PX: GIVENS CAPSULE STUDY: SHX5432

## 2019-08-02 SURGERY — IMAGING PROCEDURE, GI TRACT, INTRALUMINAL, VIA CAPSULE

## 2019-08-03 ENCOUNTER — Encounter: Payer: Self-pay | Admitting: Gastroenterology

## 2019-08-15 ENCOUNTER — Telehealth: Payer: Self-pay

## 2019-08-15 NOTE — Progress Notes (Signed)
[facility]  [address] [place], [place] [address] Phone: [phone]  Fax: [phone]   Clinic Day:  08/16/2019  Referring physician: [facility]  Chief Complaint: George Gomez is a 59 y.o. male with normocyticanemia and B12 deficiency who is seen for a 2 month assessment.   HPI: The patient was last seen in the hematology clinic on 06/21/2019. At that time, he felt alright.  He was drinking more than 2 beers a day along with 1-2 shots of alcohol.  Exam was stable.  He was seen by Dr. Lannette Donath in gastroenterology on 07/14/2019, who recommended a video capsule endoscopy.   He was brought in to [facility] ER via EMS on 07/27/2019 for abdominal pain lasting 2 weeks. He reported dark stool for 1 week. Ethyl alcohol level was at 363. CBC revealed a hematocrit of 26.7, hemoglobin 9.2, MCV 90.8, platelets 132,000, and WBC 4000.  Creatinine was 2.11.  AST 55 and ALT 25.  He underwent no further evaluation.  He states that he decided to leave.   Capsule study on 08/02/2019 revealed a 1 cm non-specific circumscribed lesion with superficial erosion in the distal small bowel, likely an ileum or distal jejunum.  During the interim, he denies a good appetite. He continues to drink; he states that yesterday he drank two shots of liquor. He states that the friends that he hangs out with drink a lot as well. He does not regularly check his blood sugar for his diabetes.    Past Medical History:  Diagnosis Date  . Anemia   . B12 deficiency 12/11/2017  . Clostridium difficile diarrhea 03/18/2016  . Diabetes mellitus without complication (HCC)   . Diverticulitis    Pt states diverticulitis  . EtOH dependence (HCC)   . Folate deficiency 07/25/2018  . GI bleed 05/30/2017  . Hypercholesteremia   . Hypertension   . Melena 10/21/2018  . Weight loss 09/15/2018    Past Surgical History:  Procedure Laterality Date  . COLONOSCOPY WITH PROPOFOL N/A  10/16/2018   Procedure: COLONOSCOPY WITH PROPOFOL;  Surgeon: Toney Reil, MD;  Location: [facility] ENDOSCOPY;  Service: Gastroenterology;  Laterality: N/A;  . ENTEROSCOPY N/A 10/16/2018   Procedure: ENTEROSCOPY;  Surgeon: Toney Reil, MD;  Location: [facility] ENDOSCOPY;  Service: Gastroenterology;  Laterality: N/A;  . ESOPHAGOGASTRODUODENOSCOPY N/A 07/02/2018   Procedure: ESOPHAGOGASTRODUODENOSCOPY (EGD);  Surgeon: Toney Reil, MD;  Location: [facility] ENDOSCOPY;  Service: Gastroenterology;  Laterality: N/A;  . ESOPHAGOGASTRODUODENOSCOPY (EGD) WITH PROPOFOL N/A 06/01/2017   Procedure: ESOPHAGOGASTRODUODENOSCOPY (EGD) WITH PROPOFOL;  Surgeon: Wyline Mood, MD;  Location: [facility] ENDOSCOPY;  Service: Gastroenterology;  Laterality: N/A;  . GIVENS CAPSULE STUDY N/A 10/17/2018   Procedure: GIVENS CAPSULE STUDY;  Surgeon: Toney Reil, MD;  Location: [facility] ENDOSCOPY;  Service: Gastroenterology;  Laterality: N/A;  . GIVENS CAPSULE STUDY N/A 08/02/2019   Procedure: GIVENS CAPSULE STUDY;  Surgeon: Toney Reil, MD;  Location: [facility] ENDOSCOPY;  Service: Gastroenterology;  Laterality: N/A;  . none      Family History  Problem Relation Age of Onset  . Rheum arthritis Mother   . Diabetes Father   . Heart disease Father   . Cancer Father   . Cancer Maternal Grandfather   . Cancer Paternal Grandfather     Social History:  reports that he has never smoked. He has never used smokeless tobacco. He reports current alcohol use of about 6.0 standard drinks of alcohol per week. He reports that he does  not use drugs. He drankliquor 2-3 x/day. He is drinking less than he did in the past (moonshine), about 2-3 shots daily. He did "crack" 10-15 years ago. He lives in [place]. Is no longer socially drinking, He says that if can overcome doing crack he can overcome drinking alcohol. The patient is alone today.    Allergies: No Known Allergies  Current Medications: Current Outpatient Medications   Medication Sig Dispense Refill  . allopurinol (ZYLOPRIM) 300 MG tablet Take 300 mg by mouth daily.     Marland Kitchen amLODipine (NORVASC) 10 MG tablet Take 10 mg by mouth daily.    Marland Kitchen atorvastatin (LIPITOR) 40 MG tablet Take 40 mg by mouth daily.     Marland Kitchen gabapentin (NEURONTIN) 300 MG capsule Take 300 mg by mouth 3 (three) times daily.   3  . losartan (COZAAR) 100 MG tablet Take 100 mg by mouth daily.    . metFORMIN (GLUCOPHAGE-XR) 500 MG 24 hr tablet Take 1,000 mg by mouth daily.    . Multiple Vitamin (MULTIVITAMIN ADULT PO) Gnp Century Mature Multivitamin Tab 125c    . naltrexone (DEPADE) 50 MG tablet Take 50 mg by mouth daily.     . propranolol ER (INDERAL LA) 60 MG 24 hr capsule Take 60 mg by mouth daily.     . Multiple Vitamin (MULTIVITAMIN ADULT) TABS TAKE ONE TABLET BY MOUTH ONCE DAILY (Patient not taking: Reported on 08/16/2019)    . pantoprazole (PROTONIX) 40 MG tablet Take 1 tablet (40 mg total) by mouth daily. 30 tablet 1   No current facility-administered medications for this visit.    Review of Systems  Constitutional: Negative for chills, diaphoresis, fever, malaise/fatigue and weight loss (up 4 lbs).       Tired most of time.  HENT: Negative.  Negative for congestion, ear pain, hearing loss, nosebleeds and sore throat.   Eyes: Negative.  Negative for blurred vision and double vision.  Respiratory: Negative.  Negative for cough, hemoptysis and shortness of breath.   Cardiovascular: Positive for leg swelling (right knee). Negative for chest pain, palpitations and orthopnea.  Gastrointestinal: Negative for abdominal pain, blood in stool, constipation, diarrhea, heartburn, melena, nausea and vomiting.       Poor oral intake.  Genitourinary: Negative for dysuria, frequency and urgency.       H/o kidney stones- no recent symptoms.  Musculoskeletal: Negative for back pain, falls, joint pain (chronic, right hip and knee) and myalgias.  Skin: Negative.  Negative for itching and rash.   Neurological: Negative for dizziness, tingling, speech change, focal weakness, weakness and headaches.  Endo/Heme/Allergies: Does not bruise/bleed easily.       Diabetes- not monitored.  Psychiatric/Behavioral: Positive for substance abuse (alcohol). Negative for depression and memory loss. The patient has insomnia (wakes up and has trouble falling back asleep). The patient is not nervous/anxious.   All other systems reviewed and are negative.  Performance status (ECOG):  1  Vitals: Blood pressure 120/65, pulse 96, temperature 97.8 F (36.6 C), temperature source Tympanic, resp. rate 18, weight 218 lb 4.1 oz (99 kg), SpO2 95 %.  Physical Exam Vitals and nursing note reviewed.  Constitutional:      General: He is not in acute distress.    Appearance: He is well-developed. He is not diaphoretic.     Comments: Fatigued appearing.  HENT:     Head: Normocephalic and atraumatic.     Mouth/Throat:     Pharynx: No oropharyngeal exudate.  Eyes:     General:  No scleral icterus.    Conjunctiva/sclera: Conjunctivae normal.     Pupils: Pupils are equal, round, and reactive to light.     Comments: Red, tired eyes  Cardiovascular:     Rate and Rhythm: Normal rate and regular rhythm.     Heart sounds: Normal heart sounds. No murmur heard.   Pulmonary:     Effort: Pulmonary effort is normal. No respiratory distress.     Breath sounds: Normal breath sounds. No wheezing or rales.  Chest:     Chest wall: No tenderness.  Abdominal:     General: There is no distension.     Palpations: Abdomen is soft. There is no mass.     Tenderness: There is no abdominal tenderness. There is no guarding or rebound.  Musculoskeletal:        General: No tenderness. Normal range of motion.     Cervical back: Normal range of motion and neck supple.  Lymphadenopathy:     Head:     Right side of head: No preauricular, posterior auricular or occipital adenopathy.     Left side of head: No preauricular, posterior  auricular or occipital adenopathy.     Cervical: No cervical adenopathy.     Upper Body:     Right upper body: No supraclavicular or axillary adenopathy.     Left upper body: No supraclavicular or axillary adenopathy.     Lower Body: No right inguinal adenopathy. No left inguinal adenopathy.  Skin:    General: Skin is warm and dry.  Neurological:     Mental Status: He is alert and oriented to person, place, and time.  Psychiatric:        Behavior: Behavior normal.        Thought Content: Thought content normal.        Judgment: Judgment normal.    Appointment on 08/16/2019  Component Date Value Ref Range Status  . Sodium 08/16/2019 136  135 - 145 mmol/L Final  . Potassium 08/16/2019 3.6  3.5 - 5.1 mmol/L Final  . Chloride 08/16/2019 100  98 - 111 mmol/L Final  . CO2 08/16/2019 21* 22 - 32 mmol/L Final  . Glucose, Bld 08/16/2019 113* 70 - 99 mg/dL Final   Glucose reference range applies only to samples taken after fasting for at least 8 hours.  . BUN 08/16/2019 15  8 - 23 mg/dL Final  . Creatinine, Ser 08/16/2019 1.06  0.61 - 1.24 mg/dL Final  . Calcium 47/82/956207/20/2021 8.9  8.9 - 10.3 mg/dL Final  . Total Protein 08/16/2019 7.9  6.5 - 8.1 g/dL Final  . Albumin 13/08/657807/20/2021 4.0  3.5 - 5.0 g/dL Final  . AST 46/96/295207/20/2021 344* 15 - 41 U/L Final  . ALT 08/16/2019 187* 0 - 44 U/L Final  . Alkaline Phosphatase 08/16/2019 84  38 - 126 U/L Final  . Total Bilirubin 08/16/2019 0.7  0.3 - 1.2 mg/dL Final  . GFR calc non Af Amer 08/16/2019 >60  >60 mL/min Final  . GFR calc Af Amer 08/16/2019 >60  >60 mL/min Final  . Anion gap 08/16/2019 15  5 - 15 Final   Performed [facility], [address] [place], [place] [id number]  . WBC 08/16/2019 3.0* 4.0 - 10.5 K/uL Final  . RBC 08/16/2019 3.24* 4.22 - 5.81 MIL/uL Final  . Hemoglobin 08/16/2019 9.9* 13.0 - 17.0 g/dL Final  . HCT 44/01/027207/20/2021 30.5* 39 - 52 % Final  . MCV 08/16/2019 94.1  80.0 - 100.0  fL Final  . MCH 08/16/2019 30.6  26.0 -  34.0 pg Final  . MCHC 08/16/2019 32.5  30.0 - 36.0 g/dL Final  . RDW 24/40/1027 15.7* 11.5 - 15.5 % Final  . Platelets 08/16/2019 124* 150 - 400 K/uL Final  . nRBC 08/16/2019 0.0  0.0 - 0.2 % Final  . Neutrophils Relative % 08/16/2019 65  % Final  . Neutro Abs 08/16/2019 1.9  1.7 - 7.7 K/uL Final  . Lymphocytes Relative 08/16/2019 22  % Final  . Lymphs Abs 08/16/2019 0.6* 0.7 - 4.0 K/uL Final  . Monocytes Relative 08/16/2019 9  % Final  . Monocytes Absolute 08/16/2019 0.3  0 - 1 K/uL Final  . Eosinophils Relative 08/16/2019 2  % Final  . Eosinophils Absolute 08/16/2019 0.1  0 - 0 K/uL Final  . Basophils Relative 08/16/2019 1  % Final  . Basophils Absolute 08/16/2019 0.0  0 - 0 K/uL Final  . Immature Granulocytes 08/16/2019 1  % Final  . Abs Immature Granulocytes 08/16/2019 0.04  0.00 - 0.07 K/uL Final   Performed at [facility], [address] [place], [place] [address]    Assessment:  Apollo Timothy is a 59 y.o. male with mild pancytopenia. CBCs from 07/31/2013 - 09/03/2017 reveal a hemoglobinfrom 10.1 - 14.7 without trend. MCV has been normal. Plateletshave ranged between 111,000 - 190,000. WBChas ranged between 2000 - 8300. WBC has been consistently low since 06/01/2017. He drinks alcoholdaily.  CBC on 08/08/2019revealed a hematocrit of 35.4, hemoglobin 11.5, MCV 94.5, platelets 149,000, WBC 2000 with an ANC of 1000. Differential included 49% segs, 34% lymphocytes, 14% monocytes, 2% eosinophils, and 1% basophils. Outside labsnote negative/normal peripheral smear, LDH, and SPEP/UPEP.  Work-up on 11/07/2019revealed a hematocrit of 29.6, hemoglobin 9.3, MCV 97.7, WBC 2600 with an ANC of 1200. Differential included 48% segs, 36% lymphs, 14% monocytes, 1% eosinophils, and 1% basophils. . B12was 332 (low normal). Normal studiesincluded: Creatinine (1.05), LFTs, ferritin (257), iron studies, folate (7.4), TSH, copper, zinc, ANA, hepatitis B core antibody,  hepatitis B surface antigen, hepatitis C antibody, and HIV testing. Retic was 2.2%. Peripheral smearrevealed no abnormalities. Flow cytometry on 01/23/2019 was negative.  He has B12 deficiencyandfolate deficiency. He began B12 injections on 12/11/2017 (last 05/24/2019). Folatewas 4.1 on 07/13/2018 and 21.6 on 08/16/2019.  Abdomen and pelvic CTon 05/30/2017 revealed hepatic steatosis. Spleen was normal.  He has a history of a GI bleedon 05/30/2017. EGDon 06/01/2017 revealed a normal esophagus, normal stomach, and duodenitis. Biopsy revealed reactive duodenitis without dysplasia or malignancy. Colonoscopyon 10/16/2018 revealed diverticulosis in the entire examined colon. The distal rectum and anal verge were normal on retroflexion view. There was normal mucosa in the entire examined colon. Small bowel enteroscopyon 09/19/2020was entirely normal (esophagus, stomach, duodenum, proximal and mid-jejunum).Capsule endoscopyon 08/02/2019 revealed a 1 cm non-specific circumscribed lesion with superficial erosion in the distal small bowel, likely an ileum or distal jejunum.  [facility].EGDon 07/02/2018 revealedalcoholic gastritis but no evidence of acute bleeding. Stools were heme positive. Herequireda PRBCtransfusion. Hemoglobinrangedbetween 8.4 - 8.7. He was discharged with PPI twice daily.  [facility] with nausea, vomiting and diarrhea.He had been drinking alcohol. Creatininewas 2.61 (baseline 0.83). He was dehydrated. He received IV fluids and electrolyte replacement. Creatinine was 1.77 on09/16/2020.Hemoglobin was 8.5.  [facility] with symptomatic anemia. He underwent small bowel enteroscopy and colonoscopy. He received 2 units of PRBCsand Venofer(400 mg).  He was seen in the [facility] ER on 04/11/2019 with alcohol  intoxication.   Symptomatically, he is chronically fatigued.  He continues to drink alcohol.  He denies any bleeding.  Hemoglobin is 9.9.  Plan: 1.   Labs today: CBC with diff, CMP, retic, ferritin, iron studies, hepatitis B core antibody total, hepatitis C antibody, folate. 2.Normocytic anemia Hematocrit28.3. Hemoglobin9.0. MCV96.3 on 06/21/2019.  Hematocrit 30.5.  Hemoglobin 9.9.  MCV 94.1 on 08/16/2019.   Ferritin 2367 with an iron saturation of 30% and a TIBC of 253 today (available after clinic). Hehas a history of a GI bleed secondary to alcoholic gastritiswith associated melena. EGD and colonoscopyin 09/2020revealed no source of GI bleeding. Capsule endoscopy on 08/02/2019 revealed a 1 cm non-specific circumscribed lesion with superficial erosion in the distal small bowel, likely an ileum or distal jejunum.  Preauth Venofer if needed.  Encourage decrease in alcohol intake. 3.  B12 deficiency B12 was 593 on 07/01/2018. He receives B12 monthly (last 07/21/2019). Folate is 21.6 today.  Continue B12 monthly. 4. Upper GI bleed Patient has a history of GI bleed in 05/2017 and 06/2018. EGD and colonoscopy  revealed no source of GI bleeding. Capsule endoscopy on 08/02/2019 revealed a 1 cm non-specific circumscribed lesion with superficial erosion in the distal small bowel, likely an ileum or distal jejunum. Discuss follow-up with Dr. Maximino Greenland. 5. Renal insufficiency  Creatinine 1.60 on 06/21/2019.  Creatinine 2.11 on 07/27/2019.  Creatinine 1.06 on 08/16/2019. Creatinine has fluctuated between 0.98 - 1.78 in the past year. M spike was 0 gm/dL and free light chain ratio normal on 03/31/2019.   Continue to monitor. 6. Weight loss Weight appears to fluctuate based on caloric intake.  Caloric intake is  decreased when he drinks alcohol.  Patient notes weight fluctuates from 207 pounds to 217 pounds.  Continue to monitor. 7.   Abnormal iron saturation  Ferritin 1121 with an iron saturation of 50% on 03/21/2019.  Ferritin 2367 with an iron saturation of 30% on 08/16/2019.  Prior iron saturations 16-45%.  Ferritin has fluctuated between [phone] since 12/03/2017.  Suspect acute phase reactant.  Hemochromatosis assay on 03/31/2019 revealed no mutations (C282Y, H63D, S65C).  Continue to monitor. 8.   Elevated LFTs  AST 344.  ALT 187.  Bilirubin 0.7.  Alkaline phosphatase 84.  Etiology is felt secondary to alcohol.    Check hepatitis studies.   9.   B12 today. 10.   RTC in 1 week for MD assessment, labs (CBC, LFTs, TSH), and +/- Venofer.  I discussed the assessment and treatment plan with the patient.  The patient was provided an opportunity to ask questions and all were answered.  The patient agreed with the plan and demonstrated an understanding of the instructions.  The patient was advised to call back or seek an in person evaluation if the symptoms worsen or if the condition fails to improve as anticipated.   Nelva Nay, MD, PhD  08/16/2019, 11:24 AM  I, Mal Misty, am acting as a Neurosurgeon for [employer]. Merlene Pulling, MD.   I, Melissa C. Merlene Pulling, MD, have reviewed the above documentation for accuracy and completeness, and I agree with the above.

## 2019-08-15 NOTE — Telephone Encounter (Signed)
-----   Message from Toney Reil, MD sent at 08/14/2019  1:17 PM EDT ----- Regarding: Re: Follow up Please schedule a follow up appt next available Discuss about capsule endoscopy results Also, please check with patient if he is having black stools and if taking oral iron  RV

## 2019-08-15 NOTE — Telephone Encounter (Signed)
Made appointment 09/21/2019 at 3:15pm

## 2019-08-16 ENCOUNTER — Encounter: Payer: Self-pay | Admitting: Hematology and Oncology

## 2019-08-16 ENCOUNTER — Other Ambulatory Visit: Payer: Self-pay | Admitting: Hematology and Oncology

## 2019-08-16 ENCOUNTER — Inpatient Hospital Stay (HOSPITAL_BASED_OUTPATIENT_CLINIC_OR_DEPARTMENT_OTHER): Payer: Medicare Other | Admitting: Hematology and Oncology

## 2019-08-16 ENCOUNTER — Other Ambulatory Visit: Payer: Self-pay

## 2019-08-16 ENCOUNTER — Inpatient Hospital Stay: Payer: Medicare Other

## 2019-08-16 ENCOUNTER — Inpatient Hospital Stay: Payer: Medicare Other | Attending: Hematology and Oncology

## 2019-08-16 VITALS — BP 120/65 | HR 96 | Temp 97.8°F | Resp 18 | Wt 218.3 lb

## 2019-08-16 DIAGNOSIS — Z79899 Other long term (current) drug therapy: Secondary | ICD-10-CM | POA: Insufficient documentation

## 2019-08-16 DIAGNOSIS — D649 Anemia, unspecified: Secondary | ICD-10-CM

## 2019-08-16 DIAGNOSIS — K760 Fatty (change of) liver, not elsewhere classified: Secondary | ICD-10-CM | POA: Insufficient documentation

## 2019-08-16 DIAGNOSIS — Y908 Blood alcohol level of 240 mg/100 ml or more: Secondary | ICD-10-CM | POA: Insufficient documentation

## 2019-08-16 DIAGNOSIS — E119 Type 2 diabetes mellitus without complications: Secondary | ICD-10-CM | POA: Insufficient documentation

## 2019-08-16 DIAGNOSIS — Z809 Family history of malignant neoplasm, unspecified: Secondary | ICD-10-CM | POA: Diagnosis not present

## 2019-08-16 DIAGNOSIS — E860 Dehydration: Secondary | ICD-10-CM | POA: Insufficient documentation

## 2019-08-16 DIAGNOSIS — R790 Abnormal level of blood mineral: Secondary | ICD-10-CM

## 2019-08-16 DIAGNOSIS — D696 Thrombocytopenia, unspecified: Secondary | ICD-10-CM

## 2019-08-16 DIAGNOSIS — Z8249 Family history of ischemic heart disease and other diseases of the circulatory system: Secondary | ICD-10-CM | POA: Diagnosis not present

## 2019-08-16 DIAGNOSIS — K921 Melena: Secondary | ICD-10-CM | POA: Insufficient documentation

## 2019-08-16 DIAGNOSIS — R634 Abnormal weight loss: Secondary | ICD-10-CM | POA: Diagnosis not present

## 2019-08-16 DIAGNOSIS — E538 Deficiency of other specified B group vitamins: Secondary | ICD-10-CM

## 2019-08-16 DIAGNOSIS — Z833 Family history of diabetes mellitus: Secondary | ICD-10-CM | POA: Diagnosis not present

## 2019-08-16 DIAGNOSIS — K2920 Alcoholic gastritis without bleeding: Secondary | ICD-10-CM | POA: Insufficient documentation

## 2019-08-16 DIAGNOSIS — N189 Chronic kidney disease, unspecified: Secondary | ICD-10-CM

## 2019-08-16 DIAGNOSIS — M7989 Other specified soft tissue disorders: Secondary | ICD-10-CM | POA: Insufficient documentation

## 2019-08-16 DIAGNOSIS — D61818 Other pancytopenia: Secondary | ICD-10-CM | POA: Diagnosis not present

## 2019-08-16 DIAGNOSIS — Z8261 Family history of arthritis: Secondary | ICD-10-CM | POA: Diagnosis not present

## 2019-08-16 DIAGNOSIS — N289 Disorder of kidney and ureter, unspecified: Secondary | ICD-10-CM | POA: Insufficient documentation

## 2019-08-16 DIAGNOSIS — N179 Acute kidney failure, unspecified: Secondary | ICD-10-CM

## 2019-08-16 DIAGNOSIS — Z7984 Long term (current) use of oral hypoglycemic drugs: Secondary | ICD-10-CM | POA: Diagnosis not present

## 2019-08-16 DIAGNOSIS — F1010 Alcohol abuse, uncomplicated: Secondary | ICD-10-CM

## 2019-08-16 DIAGNOSIS — R7989 Other specified abnormal findings of blood chemistry: Secondary | ICD-10-CM

## 2019-08-16 LAB — RETICULOCYTES
Immature Retic Fract: 25.8 % — ABNORMAL HIGH (ref 2.3–15.9)
RBC.: 3.15 MIL/uL — ABNORMAL LOW (ref 4.22–5.81)
Retic Count, Absolute: 68.4 10*3/uL (ref 19.0–186.0)
Retic Ct Pct: 2.2 % (ref 0.4–3.1)

## 2019-08-16 LAB — IRON AND TIBC
Iron: 75 ug/dL (ref 45–182)
Saturation Ratios: 30 % (ref 17.9–39.5)
TIBC: 253 ug/dL (ref 250–450)
UIBC: 178 ug/dL

## 2019-08-16 LAB — CBC WITH DIFFERENTIAL/PLATELET
Abs Immature Granulocytes: 0.04 10*3/uL (ref 0.00–0.07)
Basophils Absolute: 0 10*3/uL (ref 0.0–0.1)
Basophils Relative: 1 %
Eosinophils Absolute: 0.1 10*3/uL (ref 0.0–0.5)
Eosinophils Relative: 2 %
HCT: 30.5 % — ABNORMAL LOW (ref 39.0–52.0)
Hemoglobin: 9.9 g/dL — ABNORMAL LOW (ref 13.0–17.0)
Immature Granulocytes: 1 %
Lymphocytes Relative: 22 %
Lymphs Abs: 0.6 10*3/uL — ABNORMAL LOW (ref 0.7–4.0)
MCH: 30.6 pg (ref 26.0–34.0)
MCHC: 32.5 g/dL (ref 30.0–36.0)
MCV: 94.1 fL (ref 80.0–100.0)
Monocytes Absolute: 0.3 10*3/uL (ref 0.1–1.0)
Monocytes Relative: 9 %
Neutro Abs: 1.9 10*3/uL (ref 1.7–7.7)
Neutrophils Relative %: 65 %
Platelets: 124 10*3/uL — ABNORMAL LOW (ref 150–400)
RBC: 3.24 MIL/uL — ABNORMAL LOW (ref 4.22–5.81)
RDW: 15.7 % — ABNORMAL HIGH (ref 11.5–15.5)
WBC: 3 10*3/uL — ABNORMAL LOW (ref 4.0–10.5)
nRBC: 0 % (ref 0.0–0.2)

## 2019-08-16 LAB — HEPATITIS B CORE ANTIBODY, TOTAL: Hep B Core Total Ab: NONREACTIVE

## 2019-08-16 LAB — FOLATE: Folate: 21.6 ng/mL (ref >5.9)

## 2019-08-16 LAB — COMPREHENSIVE METABOLIC PANEL
ALT: 187 U/L — ABNORMAL HIGH (ref 0–44)
AST: 344 U/L — ABNORMAL HIGH (ref 15–41)
Albumin: 4 g/dL (ref 3.5–5.0)
Alkaline Phosphatase: 84 U/L (ref 38–126)
Anion gap: 15 (ref 5–15)
BUN: 15 mg/dL (ref 8–23)
CO2: 21 mmol/L — ABNORMAL LOW (ref 22–32)
Calcium: 8.9 mg/dL (ref 8.9–10.3)
Chloride: 100 mmol/L (ref 98–111)
Creatinine, Ser: 1.06 mg/dL (ref 0.61–1.24)
GFR calc Af Amer: >60 mL/min (ref >60)
GFR calc non Af Amer: >60 mL/min (ref >60)
Glucose, Bld: 113 mg/dL — ABNORMAL HIGH (ref 70–99)
Potassium: 3.6 mmol/L (ref 3.5–5.1)
Sodium: 136 mmol/L (ref 135–145)
Total Bilirubin: 0.7 mg/dL (ref 0.3–1.2)
Total Protein: 7.9 g/dL (ref 6.5–8.1)

## 2019-08-16 LAB — FERRITIN: Ferritin: 2367 ng/mL — ABNORMAL HIGH (ref 24–336)

## 2019-08-16 LAB — HEPATITIS C ANTIBODY: HCV Ab: NONREACTIVE

## 2019-08-16 MED ORDER — CYANOCOBALAMIN 1000 MCG/ML IJ SOLN
INTRAMUSCULAR | Status: AC
  Filled 2019-08-16: qty 1

## 2019-08-16 MED ORDER — CYANOCOBALAMIN 1000 MCG/ML IJ SOLN
1000.0000 ug | Freq: Once | INTRAMUSCULAR | Status: AC
  Administered 2019-08-16: 1000 ug via INTRAMUSCULAR

## 2019-08-18 ENCOUNTER — Other Ambulatory Visit: Payer: Self-pay

## 2019-08-18 ENCOUNTER — Telehealth: Payer: Self-pay

## 2019-08-18 NOTE — Telephone Encounter (Signed)
Patient capsule study. Study is complete Capsule reached cecum, normal small bowel transit, Lymphangiectasis in proximal small bowel. About 1cm non specific circumscribed lesion with superficial erosin is found in the distal small bowel likely in Ileum or distal jejunum.  Informed patient and patient verbalized understanding

## 2019-08-22 ENCOUNTER — Other Ambulatory Visit: Payer: Self-pay

## 2019-08-22 DIAGNOSIS — R7989 Other specified abnormal findings of blood chemistry: Secondary | ICD-10-CM

## 2019-08-22 NOTE — Progress Notes (Signed)
[facility]  [address] [place], [place] [address] Phone: [phone]  Fax: [phone]   Clinic Day:  08/23/2019  Referring physician: [facility]  Chief Complaint: George Gomez is a 59 y.o. male with normocyticanemia and B12 deficiency who is seen for 1 week assessment.   HPI: The patient was last seen in the hematology clinic on 08/16/2019. At that time, he felt chronically fatigued.  He had increased his alcohol intake.  He denied any bleeding. Hematocrit was 30.5, hemoglobin 9.9, MCV 94.1, platelets 124,000, WBC 3,000 (ANC 1,900). AST was 344 and ALT 187. Ferritin was 2,367 with an iron saturation of 30% and a TIBC of 253. Retic count was 2.2%. Folate was 21.6. Hepatitis C antibody and Hepatitis B core antibody were non reactive. He received monthly B12.  During the interim, he has been "alright."  He is not sleeping well.  He falls asleep around 11:00pm, wakes up a 3:00am and cannot fall back asleep. He is eating much better. He has numbness and shooting pain in his left hand. He denies surgeries or trauma to the hands. His right knee pain and swelling has resolved.  The patient has cut back on his drinking a little bit. He did not drink over the weekend because he was at his mother's house.  The patient has an appointment at the end of next month with Dr. Allegra Lai.   Past Medical History:  Diagnosis Date  . Anemia   . B12 deficiency 12/11/2017  . Clostridium difficile diarrhea 03/18/2016  . Diabetes mellitus without complication (HCC)   . Diverticulitis    Pt states diverticulitis  . EtOH dependence (HCC)   . Folate deficiency 07/25/2018  . GI bleed 05/30/2017  . Hypercholesteremia   . Hypertension   . Melena 10/21/2018  . Weight loss 09/15/2018    Past Surgical History:  Procedure Laterality Date  . COLONOSCOPY WITH PROPOFOL N/A 10/16/2018   Procedure: COLONOSCOPY WITH PROPOFOL;  Surgeon: Toney Reil, MD;   Location: [facility] ENDOSCOPY;  Service: Gastroenterology;  Laterality: N/A;  . ENTEROSCOPY N/A 10/16/2018   Procedure: ENTEROSCOPY;  Surgeon: Toney Reil, MD;  Location: [facility] ENDOSCOPY;  Service: Gastroenterology;  Laterality: N/A;  . ESOPHAGOGASTRODUODENOSCOPY N/A 07/02/2018   Procedure: ESOPHAGOGASTRODUODENOSCOPY (EGD);  Surgeon: Toney Reil, MD;  Location: [facility] ENDOSCOPY;  Service: Gastroenterology;  Laterality: N/A;  . ESOPHAGOGASTRODUODENOSCOPY (EGD) WITH PROPOFOL N/A 06/01/2017   Procedure: ESOPHAGOGASTRODUODENOSCOPY (EGD) WITH PROPOFOL;  Surgeon: Wyline Mood, MD;  Location: [facility] ENDOSCOPY;  Service: Gastroenterology;  Laterality: N/A;  . GIVENS CAPSULE STUDY N/A 10/17/2018   Procedure: GIVENS CAPSULE STUDY;  Surgeon: Toney Reil, MD;  Location: [facility] ENDOSCOPY;  Service: Gastroenterology;  Laterality: N/A;  . GIVENS CAPSULE STUDY N/A 08/02/2019   Procedure: GIVENS CAPSULE STUDY;  Surgeon: Toney Reil, MD;  Location: [facility] ENDOSCOPY;  Service: Gastroenterology;  Laterality: N/A;  . none      Family History  Problem Relation Age of Onset  . Rheum arthritis Mother   . Diabetes Father   . Heart disease Father   . Cancer Father   . Cancer Maternal Grandfather   . Cancer Paternal Grandfather     Social History:  reports that he has never smoked. He has never used smokeless tobacco. He reports current alcohol use of about 6.0 standard drinks of alcohol per week. He reports that he does not use drugs. He drankliquor 2-3 x/day. He is drinking less than he did in the past (moonshine),  about 2-3 shots daily. He did "crack" 10-15 years ago. He lives in [place]. Is no longer socially drinking, He says that if can overcome doing crack he can overcome drinking alcohol. The patient is alone today.    Allergies: No Known Allergies  Current Medications: Current Outpatient Medications  Medication Sig Dispense Refill  . allopurinol (ZYLOPRIM) 300 MG tablet Take 300 mg by  mouth daily.     Marland Kitchen amLODipine (NORVASC) 10 MG tablet Take 10 mg by mouth daily.    Marland Kitchen atorvastatin (LIPITOR) 40 MG tablet Take 40 mg by mouth daily.     Marland Kitchen gabapentin (NEURONTIN) 300 MG capsule Take 300 mg by mouth 3 (three) times daily.   3  . losartan (COZAAR) 100 MG tablet Take 100 mg by mouth daily.    . metFORMIN (GLUCOPHAGE-XR) 500 MG 24 hr tablet Take 1,000 mg by mouth daily.    . Multiple Vitamin (MULTIVITAMIN ADULT PO) Gnp Century Mature Multivitamin Tab 125c    . naltrexone (DEPADE) 50 MG tablet Take 50 mg by mouth daily.     . pantoprazole (PROTONIX) 40 MG tablet Take 1 tablet (40 mg total) by mouth daily. 30 tablet 1  . propranolol ER (INDERAL LA) 60 MG 24 hr capsule Take 60 mg by mouth daily.     . Multiple Vitamin (MULTIVITAMIN ADULT) TABS TAKE ONE TABLET BY MOUTH ONCE DAILY (Patient not taking: Reported on 08/16/2019)     No current facility-administered medications for this visit.    Review of Systems  Constitutional: Negative for chills, diaphoresis, fever, malaise/fatigue and weight loss (up 2 lbs).       Feels "alright."  HENT: Negative.  Negative for congestion, ear pain, hearing loss, nosebleeds and sore throat.   Eyes: Negative.  Negative for blurred vision and double vision.  Respiratory: Negative.  Negative for cough, hemoptysis and shortness of breath.   Cardiovascular: Negative for chest pain, palpitations, orthopnea and leg swelling.  Gastrointestinal: Negative for abdominal pain, blood in stool, constipation, diarrhea, heartburn, melena, nausea and vomiting.       Eating better.  Genitourinary: Negative for dysuria, frequency and urgency.       H/o kidney stones- no recent symptoms.  Musculoskeletal: Negative for back pain, falls, joint pain and myalgias.  Skin: Negative.  Negative for itching and rash.  Neurological: Positive for sensory change (intermittent pain and numbness in left hand). Negative for dizziness, tingling, speech change, focal weakness, weakness  and headaches.  Endo/Heme/Allergies: Does not bruise/bleed easily.       Diabetes  Psychiatric/Behavioral: Positive for substance abuse (alcohol; cut back a little bit, ETOH level 363 on 07/27/2019). Negative for depression and memory loss. The patient has insomnia (wakes up and has trouble falling back asleep). The patient is not nervous/anxious.   All other systems reviewed and are negative.  Performance status (ECOG): 0 - Asymptomatic  Vitals: Blood pressure 116/69, pulse 76, temperature (!) 97.4 F (36.3 C), temperature source Tympanic, resp. rate 18, height 5\' 11"  (1.803 m), weight (!) 220 lb 9.1 oz (100 kg), SpO2 100 %.  Physical Exam Vitals and nursing note reviewed.  Constitutional:      General: He is not in acute distress.    Appearance: He is well-developed. He is not diaphoretic.  HENT:     Head: Normocephalic and atraumatic.     Comments: Short hair.    Mouth/Throat:     Pharynx: No oropharyngeal exudate.  Eyes:     General: No scleral icterus.  Conjunctiva/sclera: Conjunctivae normal.     Pupils: Pupils are equal, round, and reactive to light.  Cardiovascular:     Rate and Rhythm: Normal rate and regular rhythm.     Heart sounds: Normal heart sounds. No murmur heard.   Pulmonary:     Effort: Pulmonary effort is normal. No respiratory distress.     Breath sounds: Normal breath sounds. No wheezing or rales.  Abdominal:     General: There is no distension.     Palpations: Abdomen is soft. There is no mass.     Tenderness: There is no abdominal tenderness. There is no guarding or rebound.  Musculoskeletal:        General: No tenderness. Normal range of motion.     Cervical back: Normal range of motion and neck supple.  Skin:    General: Skin is warm and dry.  Neurological:     Mental Status: He is alert and oriented to person, place, and time.  Psychiatric:        Behavior: Behavior normal.        Thought Content: Thought content normal.        Judgment:  Judgment normal.     Appointment on 08/23/2019  Component Date Value Ref Range Status  . WBC 08/23/2019 3.2* 4.0 - 10.5 K/uL Final  . RBC 08/23/2019 3.26* 4.22 - 5.81 MIL/uL Final  . Hemoglobin 08/23/2019 9.9* 13.0 - 17.0 g/dL Final  . HCT 50/93/2671 31.6* 39 - 52 % Final  . MCV 08/23/2019 96.9  80.0 - 100.0 fL Final  . MCH 08/23/2019 30.4  26.0 - 34.0 pg Final  . MCHC 08/23/2019 31.3  30.0 - 36.0 g/dL Final  . RDW 24/58/0998 15.9* 11.5 - 15.5 % Final  . Platelets 08/23/2019 163  150 - 400 K/uL Final  . nRBC 08/23/2019 0.0  0.0 - 0.2 % Final  . Neutrophils Relative % 08/23/2019 45  % Final  . Neutro Abs 08/23/2019 1.4* 1.7 - 7.7 K/uL Final  . Lymphocytes Relative 08/23/2019 31  % Final  . Lymphs Abs 08/23/2019 1.0  0.7 - 4.0 K/uL Final  . Monocytes Relative 08/23/2019 18  % Final  . Monocytes Absolute 08/23/2019 0.6  0 - 1 K/uL Final  . Eosinophils Relative 08/23/2019 4  % Final  . Eosinophils Absolute 08/23/2019 0.1  0 - 0 K/uL Final  . Basophils Relative 08/23/2019 1  % Final  . Basophils Absolute 08/23/2019 0.0  0 - 0 K/uL Final  . Immature Granulocytes 08/23/2019 1  % Final  . Abs Immature Granulocytes 08/23/2019 0.04  0.00 - 0.07 K/uL Final   Performed at [facility], [address] [place], [place] [address]    Assessment:  Matin Mattioli is a 59 y.o. male with mild pancytopenia. CBCs from 07/31/2013 - 09/03/2017 reveal a hemoglobinfrom 10.1 - 14.7 without trend. MCV has been normal. Plateletshave ranged between 111,000 - 190,000. WBChas ranged between 2000 - 8300. WBC has been consistently low since 06/01/2017. He drinks alcoholdaily.  CBC on 08/08/2019revealed a hematocrit of 35.4, hemoglobin 11.5, MCV 94.5, platelets 149,000, WBC 2000 with an ANC of 1000. Differential included 49% segs, 34% lymphocytes, 14% monocytes, 2% eosinophils, and 1% basophils. Outside labsnote negative/normal peripheral smear, LDH, and SPEP/UPEP.  Work-up on  11/07/2019revealed a hematocrit of 29.6, hemoglobin 9.3, MCV 97.7, WBC 2600 with an ANC of 1200. Differential included 48% segs, 36% lymphs, 14% monocytes, 1% eosinophils, and 1% basophils. . B12was 332 (low normal). Normal studiesincluded: Creatinine (  1.05), LFTs, ferritin (257), iron studies, folate (7.4), TSH, copper, zinc, ANA, hepatitis B core antibody, hepatitis B surface antigen, hepatitis C antibody, and HIV testing. Retic was 2.2%. Peripheral smearrevealed no abnormalities. Flow cytometry on 01/23/2019 was negative.  He has B12 deficiencyandfolate deficiency. He began B12 injections on 12/11/2017 (last  08/16/2019). Folatewas 4.1 on 07/13/2018 and 8.9 on 08/10/2018.  Abdomen and pelvic CTon 05/30/2017 revealed hepatic steatosis. Spleen was normal.  He has a history of a GI bleedon 05/30/2017. EGDon 06/01/2017 revealed a normal esophagus, normal stomach, and duodenitis. Biopsy revealed reactive duodenitis without dysplasia or malignancy. Colonoscopyon 10/16/2018 revealed diverticulosis in the entire examined colon. The distal rectum and anal verge were normal on retroflexion view. There was normal mucosa in the entire examined colon. Small bowel enteroscopyon 09/19/2020was entirely normal (esophagus, stomach, duodenum, proximal and mid-jejunum).Capsule endoscopywas sub-optimal.  [facility].EGDon 07/02/2018 revealedalcoholic gastritis but no evidence of acute bleeding. Stools were heme positive. Herequireda PRBCtransfusion. Hemoglobinrangedbetween 8.4 - 8.7. He was discharged with PPI twice daily.  [facility] with nausea, vomiting and diarrhea.He had been drinking alcohol. Creatininewas 2.61 (baseline 0.83). He was dehydrated. He received IV fluids and electrolyte replacement. Creatinine was 1.77 on09/16/2020.Hemoglobin was 8.5.  [facility] with symptomatic anemia. He underwent small bowel enteroscopy and colonoscopy. He received 2 units of PRBCsand Venofer(400 mg).  He was seen in the [facility] ER on 04/11/2019 with alcohol intoxication.   Symptomatically, he is doing better this week.  He is cut back on his alcohol intake.  Exam is stable.  Plan: 1.   Labs today: CBC, LFTs, TSH. 2.Normocytic anemia Hematocrit28.3. Hemoglobin9.0. MCV96.3on 06/21/2019.             Hematocrit 30.5.  Hemoglobin 9.9.  MCV 94.1 on 08/16/2019.  Hematocrit 31.6.  Hemoglobin 9.9.  MCV 96.9 on 08/23/2019.                         Ferritin 2367 with an iron saturation of 30% and a TIBC of 253 on 08/16/2019. Hehas a history of a GI bleed secondary to alcoholic gastritiswith associated melena. EGD and colonoscopyin 09/2020revealed no source of GI bleeding. Capsule endoscopy on 08/02/2019 revealed a 1 cm non-specific circumscribed lesion with superficial erosion in the distal small bowel, likely an ileum or distal jejunum.             Check TSH today.  Continue to encourage decrease alcohol intake. 3.  B12 deficiency B12 was 593 on 07/01/2018. He receives B12 monthly (last 08/16/2019). Folate is 21.6 on 0720//2021.             Continue B12 monthly. 4. Upper GI bleed Patient has a history of GI bleed in 05/2017 and 06/2018. EGD and colonoscopy revealed no source of GI bleeding. Capsuleendoscopy on 08/02/2019 revealed a 1 cm non-specific circumscribed lesion with superficial erosion in the distal small bowel, likely an ileum or distal jejunum. Patient has follow-up with the GI at the end of 08/2019. 5. Renal insufficiency             Creatinine 1.60 on 06/21/2019.             Creatinine 2.11 on 07/27/2019.             Creatinine 1.06 on  08/16/2019. Creatinine has fluctuated between 0.98 - 1.78 in the past year. M spike was 0 gm/dL and  free light chain ratio normal on 03/31/2019.              Monitor periodically. 6. Weight loss Patient has gained 2 pounds in the past week when not drinking alcohol.  Weight fluctuates based on caloric intake..              Caloric intake is decreased when he drinks alcohol.             Patient notes weight fluctuates from 207 pounds to 217 pounds.             Continue to monitor. 7.Abnormal iron saturation             Ferritin 1121 with an iron saturation of 50% on 03/21/2019.             Ferritin 2367 with an iron saturation of 30% on 08/16/2019.             Prior iron saturations 16-45%.             Ferritin has fluctuated between [phone] since 12/03/2017.             Suspect ongoing ferritin fluctuation due to an acute phase reactant.             Hemochromatosis assay on 03/31/2019 revealed no mutations (C282Y, H63D, S65C).             Continue to monitor. 8.   Elevated LFTs             AST 344.  ALT 187.  Bilirubin 0.7.  Alkaline phosphatase 84 on 08/16/2019.             AST 80.  ALT 75.  Bilirubin 0.6.  Alkaline phosphatase 97 on 08/23/2019.  Hepatitis B and C serologies negative on 08/16/2019.  Etiology secondary to alcohol.               Continue to encourage decrease alcohol consumption. 9.   No Venofer today. 10.   Patient to f/u with Dr Leslye PeerVanga/Tahiliani in 08/2019. 11.   Continue B12 monthly (last 08/16/2019). 12.   RTC in 1 month for MD assessment and labs (CBC with diff, CMP, ferritin, iron studies, sed rate).  I discussed the assessment and treatment plan with the patient.  The patient was provided an opportunity to ask questions and all were answered.  The patient agreed with the plan and demonstrated an understanding of the instructions.  The patient was advised to call back or seek an in person evaluation if the symptoms worsen or  if the condition fails to improve as anticipated.   Nelva NayMelissa Kennetta Pavlovic, MD, PhD  08/23/2019, 10:35 AM  I, Danella PentonEmily J Tufford, am acting as a Neurosurgeonscribe for General MotorsMelissa C. Merlene Pullingorcoran, MD.   I, Ernestene Coover C. Merlene Pullingorcoran, MD, have reviewed the above documentation for accuracy and completeness, and I agree with the above.

## 2019-08-23 ENCOUNTER — Inpatient Hospital Stay: Payer: Medicare Other

## 2019-08-23 ENCOUNTER — Other Ambulatory Visit: Payer: Self-pay

## 2019-08-23 ENCOUNTER — Inpatient Hospital Stay (HOSPITAL_BASED_OUTPATIENT_CLINIC_OR_DEPARTMENT_OTHER): Payer: Medicare Other | Admitting: Hematology and Oncology

## 2019-08-23 ENCOUNTER — Encounter: Payer: Self-pay | Admitting: Hematology and Oncology

## 2019-08-23 VITALS — BP 116/69 | HR 76 | Temp 97.4°F | Resp 18 | Ht 71.0 in | Wt 220.6 lb

## 2019-08-23 DIAGNOSIS — D696 Thrombocytopenia, unspecified: Secondary | ICD-10-CM | POA: Diagnosis not present

## 2019-08-23 DIAGNOSIS — R7989 Other specified abnormal findings of blood chemistry: Secondary | ICD-10-CM | POA: Diagnosis not present

## 2019-08-23 DIAGNOSIS — D72819 Decreased white blood cell count, unspecified: Secondary | ICD-10-CM

## 2019-08-23 DIAGNOSIS — D649 Anemia, unspecified: Secondary | ICD-10-CM

## 2019-08-23 DIAGNOSIS — F1010 Alcohol abuse, uncomplicated: Secondary | ICD-10-CM

## 2019-08-23 DIAGNOSIS — E538 Deficiency of other specified B group vitamins: Secondary | ICD-10-CM

## 2019-08-23 LAB — CBC WITH DIFFERENTIAL/PLATELET
Abs Immature Granulocytes: 0.04 10*3/uL (ref 0.00–0.07)
Basophils Absolute: 0 10*3/uL (ref 0.0–0.1)
Basophils Relative: 1 %
Eosinophils Absolute: 0.1 10*3/uL (ref 0.0–0.5)
Eosinophils Relative: 4 %
HCT: 31.6 % — ABNORMAL LOW (ref 39.0–52.0)
Hemoglobin: 9.9 g/dL — ABNORMAL LOW (ref 13.0–17.0)
Immature Granulocytes: 1 %
Lymphocytes Relative: 31 %
Lymphs Abs: 1 10*3/uL (ref 0.7–4.0)
MCH: 30.4 pg (ref 26.0–34.0)
MCHC: 31.3 g/dL (ref 30.0–36.0)
MCV: 96.9 fL (ref 80.0–100.0)
Monocytes Absolute: 0.6 10*3/uL (ref 0.1–1.0)
Monocytes Relative: 18 %
Neutro Abs: 1.4 10*3/uL — ABNORMAL LOW (ref 1.7–7.7)
Neutrophils Relative %: 45 %
Platelets: 163 10*3/uL (ref 150–400)
RBC: 3.26 MIL/uL — ABNORMAL LOW (ref 4.22–5.81)
RDW: 15.9 % — ABNORMAL HIGH (ref 11.5–15.5)
WBC: 3.2 10*3/uL — ABNORMAL LOW (ref 4.0–10.5)
nRBC: 0 % (ref 0.0–0.2)

## 2019-08-23 LAB — HEPATIC FUNCTION PANEL
ALT: 75 U/L — ABNORMAL HIGH (ref 0–44)
AST: 80 U/L — ABNORMAL HIGH (ref 15–41)
Albumin: 4 g/dL (ref 3.5–5.0)
Alkaline Phosphatase: 97 U/L (ref 38–126)
Bilirubin, Direct: 0.1 mg/dL (ref 0.0–0.2)
Indirect Bilirubin: 0.5 mg/dL (ref 0.3–0.9)
Total Bilirubin: 0.6 mg/dL (ref 0.3–1.2)
Total Protein: 8 g/dL (ref 6.5–8.1)

## 2019-08-23 LAB — TSH: TSH: 1.856 u[IU]/mL (ref 0.350–4.500)

## 2019-08-23 NOTE — Progress Notes (Signed)
No new changes noted today 

## 2019-09-13 ENCOUNTER — Inpatient Hospital Stay: Payer: Medicare Other | Attending: Hematology and Oncology

## 2019-09-13 ENCOUNTER — Other Ambulatory Visit: Payer: Self-pay

## 2019-09-13 DIAGNOSIS — E538 Deficiency of other specified B group vitamins: Secondary | ICD-10-CM

## 2019-09-13 DIAGNOSIS — R634 Abnormal weight loss: Secondary | ICD-10-CM | POA: Diagnosis not present

## 2019-09-13 DIAGNOSIS — R5383 Other fatigue: Secondary | ICD-10-CM | POA: Insufficient documentation

## 2019-09-13 DIAGNOSIS — D649 Anemia, unspecified: Secondary | ICD-10-CM | POA: Diagnosis present

## 2019-09-13 MED ORDER — CYANOCOBALAMIN 1000 MCG/ML IJ SOLN
1000.0000 ug | Freq: Once | INTRAMUSCULAR | Status: AC
  Administered 2019-09-13: 1000 ug via INTRAMUSCULAR

## 2019-09-21 ENCOUNTER — Other Ambulatory Visit: Payer: Self-pay

## 2019-09-21 ENCOUNTER — Ambulatory Visit: Payer: Medicare Other | Admitting: Gastroenterology

## 2019-09-22 ENCOUNTER — Other Ambulatory Visit
Admission: RE | Admit: 2019-09-22 | Discharge: 2019-09-22 | Disposition: A | Discharge status: Home / Self Care | Payer: Medicare Other | Attending: Gastroenterology | Admitting: Gastroenterology

## 2019-09-22 ENCOUNTER — Other Ambulatory Visit: Payer: Self-pay

## 2019-09-22 ENCOUNTER — Encounter: Payer: Self-pay | Admitting: Gastroenterology

## 2019-09-22 ENCOUNTER — Ambulatory Visit (INDEPENDENT_AMBULATORY_CARE_PROVIDER_SITE_OTHER): Payer: Medicare Other | Admitting: Gastroenterology

## 2019-09-22 VITALS — BP 120/68 | HR 89 | Temp 99.0°F | Wt 221.1 lb

## 2019-09-22 DIAGNOSIS — K639 Disease of intestine, unspecified: Secondary | ICD-10-CM

## 2019-09-22 LAB — COMPREHENSIVE METABOLIC PANEL
ALT: 45 U/L — ABNORMAL HIGH (ref 0–44)
AST: 70 U/L — ABNORMAL HIGH (ref 15–41)
Albumin: 4.3 g/dL (ref 3.5–5.0)
Alkaline Phosphatase: 83 U/L (ref 38–126)
Anion gap: 9 (ref 5–15)
BUN: 18 mg/dL (ref 8–23)
CO2: 24 mmol/L (ref 22–32)
Calcium: 9.2 mg/dL (ref 8.9–10.3)
Chloride: 103 mmol/L (ref 98–111)
Creatinine, Ser: 1.34 mg/dL — ABNORMAL HIGH (ref 0.61–1.24)
GFR calc Af Amer: >60 mL/min (ref >60)
GFR calc non Af Amer: 57 mL/min — ABNORMAL LOW (ref >60)
Glucose, Bld: 113 mg/dL — ABNORMAL HIGH (ref 70–99)
Potassium: 3.5 mmol/L (ref 3.5–5.1)
Sodium: 136 mmol/L (ref 135–145)
Total Bilirubin: 0.5 mg/dL (ref 0.3–1.2)
Total Protein: 8.3 g/dL — ABNORMAL HIGH (ref 6.5–8.1)

## 2019-09-22 NOTE — Progress Notes (Signed)
George Repress, MD [address]  [address]  [place], [place] [address]  Main: [phone]  Fax: [phone] Pager: [phone]   Primary Care Physician: [facility]  Primary Gastroenterologist:  Dr. Tobi Bastos  Chief Complaint  Patient presents with  . dark stool    has had some RLQ pain   . Capsule study    discuss capsule study     HPI: George Gomez is a 59 y.o. male with history of alcohol abuse, pancytopenia, metabolic syndrome, mild CKD.  Patient is closely followed by Dr. Merlene Pulling for pancytopenia.  Patient was originally seen by GI when he was admitted with worsening anemia and melena in 05/2017.  At that time, he underwent upper endoscopy which revealed duodenitis only.  He also had a colonoscopy at [facility] in 2016, 1 tubular adenoma removed.  He underwent repeat colonoscopy as well as small bowel endoscopy in 09/2018 which was unremarkable except for colonic diverticulosis.  Small bowel capsule was inconclusive.  Patient was advised to undergo repeat capsule which he did not yet.  Today, patient reports that he has significantly cut down on alcohol to 3 beers per day.  He reports feeling better overall.  Does report feeling tired.  He denies black stools, rectal bleeding, abdominal pain.  He does report hiccups with catching of breath.  He has history of GERD for which he takes Protonix 40 mg daily.  Patient does not have iron deficiency, B12 deficiency or folate deficiency  Follow-up visit 09/22/2019 Patient underwent video capsule endoscopy due to history of black stools 2 weeks ago. His video capsule endoscopy revealed distal small bowel lesion. Apparently, he reports that he notices black stools whenever he drinks alcohol and this occurred about 2 weeks ago. He is currently experiencing brown bowel movements only. He does not have any GI concerns today. His hemoglobin has been stable  Current Outpatient Medications  Medication Sig Dispense Refill  .  allopurinol (ZYLOPRIM) 300 MG tablet Take 300 mg by mouth daily.     Marland Kitchen amLODipine (NORVASC) 10 MG tablet Take 10 mg by mouth daily.    Marland Kitchen atorvastatin (LIPITOR) 40 MG tablet Take 40 mg by mouth daily.     . clobetasol cream (TEMOVATE) 0.05 % Apply topically.    . gabapentin (NEURONTIN) 300 MG capsule Take 300 mg by mouth 3 (three) times daily.   3  . losartan (COZAAR) 100 MG tablet Take 100 mg by mouth daily.    . metFORMIN (GLUCOPHAGE-XR) 500 MG 24 hr tablet Take 1,000 mg by mouth daily.    . Multiple Vitamin (MULTIVITAMIN ADULT PO) Gnp Century Mature Multivitamin Tab 125c    . Multiple Vitamin (MULTIVITAMIN ADULT) TABS TAKE ONE TABLET BY MOUTH ONCE DAILY    . naltrexone (DEPADE) 50 MG tablet Take 50 mg by mouth daily.     . propranolol ER (INDERAL LA) 60 MG 24 hr capsule Take 60 mg by mouth daily.     . pantoprazole (PROTONIX) 40 MG tablet Take 1 tablet (40 mg total) by mouth daily. 30 tablet 1   No current facility-administered medications for this visit.    Allergies as of 09/22/2019  . (No Known Allergies)    NSAIDs: None  Antiplts/Anticoagulants/Anti thrombotics: None  GI procedures:  Upper endoscopy 07/02/2018 Duodenitis, no ulcer DIAGNOSIS:  A. DUODENAL BULB; COLD BIOPSY:  - DUODENAL MUCOSA WITH HEALING MUCOSAL INJURY (REACTIVE DUODENITIS).  - NEGATIVE FOR VIRAL CYTOPATHIC EFFECT, PARASITE, DYSPLASIA, AND  MALIGNANCY.   Repeat  upper endoscopy 07/02/2018 DIAGNOSIS:  A. STOMACH, RANDOM; BIOPSIES:  - FINDINGS CONSISTENT WITH REACTIVE GASTROPATHY.  - NEGATIVE FOR INTESTINAL METAPLASIA, DYSPLASIA AND MALIGNANCY.   Small bowel endoscopy 10/16/2018 Normal  Colonoscopy 10/16/2018 Fair bowel prep Sigmoid diverticulosis Normal colon mucosa  ROS:  General: Negative for anorexia, weight loss, fever, chills, fatigue, weakness. ENT: Negative for hoarseness, difficulty swallowing , nasal congestion. CV: Negative for chest pain, angina, palpitations, dyspnea on exertion,  peripheral edema.  Respiratory: Negative for dyspnea at rest, dyspnea on exertion, cough, sputum, wheezing.  GI: See history of present illness. GU:  Negative for dysuria, hematuria, urinary incontinence, urinary frequency, nocturnal urination.  Endo: Negative for unusual weight change.    Physical Examination:   BP 120/68 (BP Location: Left Arm, Patient Position: Sitting, Cuff Size: Normal)   Pulse 89   Temp 99 F (37.2 C) (Oral)   Wt 221 lb 2 oz (100.3 kg)   BMI 30.84 kg/m   General: Well-nourished, well-developed in no acute distress.  Eyes: No icterus. Conjunctivae pink. Mouth: Oropharyngeal mucosa moist and pink , no lesions erythema or exudate. Lungs: Clear to auscultation bilaterally. Non-labored. Heart: Regular rate and rhythm, no murmurs rubs or gallops.  Abdomen: Bowel sounds are normal, obese, nontender, nondistended, no hepatosplenomegaly or masses, no hernia , no rebound or guarding.   Extremities: No lower extremity edema. No clubbing or deformities. Neuro: Alert and oriented x 3.  Grossly intact. Skin: Warm and dry, no jaundice.   Psych: Alert and cooperative, normal mood and affect.   Imaging Studies: No results found.  Assessment and Plan:   George Gomez is a 59 y.o. male metabolic syndrome, CKD, history of alcohol abuse is seen in consultation for anemia, history of black stools  History of black stools Normocytic anemia, his iron studies are consistent with anemia of chronic disease No evidence of B12 or folate deficiency Video capsule endoscopy revealed a distal small bowel lesion which was nonbleeding. Recommend MR enterography for further evaluation and patient is agreeable Complete abstinence from alcohol use   Follow up after above work-up   Dr Lannette Donath, MD

## 2019-09-22 NOTE — Patient Instructions (Addendum)
Patient MRI is scheduled for 10/12/2019 at [facility] at 8:00am . Please arrive at 7:30am. Nothing to eat drink after midnight

## 2019-09-23 ENCOUNTER — Encounter: Payer: Self-pay | Admitting: Gastroenterology

## 2019-09-26 ENCOUNTER — Other Ambulatory Visit: Payer: Medicare Other

## 2019-09-26 ENCOUNTER — Ambulatory Visit: Payer: Medicare Other | Admitting: Hematology and Oncology

## 2019-09-26 NOTE — Progress Notes (Signed)
[facility]  [address], [address] [place], [place] [address] Phone: [phone]  Fax: [phone]   Clinic Day:  09/27/2019  Referring physician: [facility]  Chief Complaint: George Gomez is a 59 y.o. male with normocyticanemia and B12 deficiency who is seen for 1 month assessment.   HPI: The patient was last seen in the hematology clinic on 08/23/2019. At that time, he was doing better. He had cut back on his alcohol intake.  Exam was stable. Hematocrit was 31.6, hemoglobin 9.9, MCV 96.9, platelets 163,000, WBC 3,200 (ANC 1,400). AST was 80 and ALT was 75. TSH was 1.856.  The patient received a Vitamin B12 injection on 09/13/2019.  The patient saw Dr. Allegra Lai on 09/22/2019. He had cut down on alcohol to 3 beers daily. He felt better overall, but reported feeling tired. He noted black stools two weeks prior. MR enterography for further evaluation was scheduled for 10/12/2019.  During the interim, he has been "alright".  He has cut down on his alcohol intake to 2 shots per day. The patient is eating better. He denies any recent kidney stones. The numbness in his left hand has resolved. He still does not sleep well.  The patient's father passed away during the interim after being in the hospital for 7 days.   Past Medical History:  Diagnosis Date  . Acute renal failure (ARF) (HCC) 08/28/2016  . Acute renal failure (HCC) 10/12/2018  . Anemia   . B12 deficiency 12/11/2017  . Clostridium difficile diarrhea 03/18/2016  . Diabetes mellitus without complication (HCC)   . Diverticulitis    Pt states diverticulitis  . EtOH dependence (HCC)   . Folate deficiency 07/25/2018  . GI bleed 05/30/2017  . Hypercholesteremia   . Hypertension   . Melena 10/21/2018  . Weight loss 09/15/2018    Past Surgical History:  Procedure Laterality Date  . COLONOSCOPY WITH PROPOFOL N/A 10/16/2018   Procedure: COLONOSCOPY WITH PROPOFOL;  Surgeon: Toney Reil,  MD;  Location: [facility] ENDOSCOPY;  Service: Gastroenterology;  Laterality: N/A;  . ENTEROSCOPY N/A 10/16/2018   Procedure: ENTEROSCOPY;  Surgeon: Toney Reil, MD;  Location: [facility] ENDOSCOPY;  Service: Gastroenterology;  Laterality: N/A;  . ESOPHAGOGASTRODUODENOSCOPY N/A 07/02/2018   Procedure: ESOPHAGOGASTRODUODENOSCOPY (EGD);  Surgeon: Toney Reil, MD;  Location: [facility] ENDOSCOPY;  Service: Gastroenterology;  Laterality: N/A;  . ESOPHAGOGASTRODUODENOSCOPY (EGD) WITH PROPOFOL N/A 06/01/2017   Procedure: ESOPHAGOGASTRODUODENOSCOPY (EGD) WITH PROPOFOL;  Surgeon: Wyline Mood, MD;  Location: [facility] ENDOSCOPY;  Service: Gastroenterology;  Laterality: N/A;  . GIVENS CAPSULE STUDY N/A 10/17/2018   Procedure: GIVENS CAPSULE STUDY;  Surgeon: Toney Reil, MD;  Location: [facility] ENDOSCOPY;  Service: Gastroenterology;  Laterality: N/A;  . GIVENS CAPSULE STUDY N/A 08/02/2019   Procedure: GIVENS CAPSULE STUDY;  Surgeon: Toney Reil, MD;  Location: [facility] ENDOSCOPY;  Service: Gastroenterology;  Laterality: N/A;  . none      Family History  Problem Relation Age of Onset  . Rheum arthritis Mother   . Diabetes Father   . Heart disease Father   . Cancer Father   . Cancer Maternal Grandfather   . Cancer Paternal Grandfather     Social History:  reports that he has never smoked. He has never used smokeless tobacco. He reports current alcohol use of about 6.0 standard drinks of alcohol per week. He reports that he does not use drugs. He drankliquor 2-3 x/day. He is drinking less than he did in the past (moonshine), about 2-3  shots daily. He did "crack" 10-15 years ago. He lives in [place]. Is no longer socially drinking, He says that if can overcome doing crack he can overcome drinking alcohol. The patient is alone today.    Allergies: No Known Allergies  Current Medications: Current Outpatient Medications  Medication Sig Dispense Refill  . allopurinol (ZYLOPRIM) 300 MG tablet Take 300 mg  by mouth daily.     Marland Kitchen amLODipine (NORVASC) 10 MG tablet Take 10 mg by mouth daily.    Marland Kitchen atorvastatin (LIPITOR) 40 MG tablet Take 40 mg by mouth daily.     . clobetasol cream (TEMOVATE) 0.05 % Apply topically.    . gabapentin (NEURONTIN) 300 MG capsule Take 300 mg by mouth 3 (three) times daily.   3  . losartan (COZAAR) 100 MG tablet Take 100 mg by mouth daily.    . metFORMIN (GLUCOPHAGE-XR) 500 MG 24 hr tablet Take 1,000 mg by mouth daily.    . Multiple Vitamin (MULTIVITAMIN ADULT PO) Gnp Century Mature Multivitamin Tab 125c    . naltrexone (DEPADE) 50 MG tablet Take 50 mg by mouth daily.     . pantoprazole (PROTONIX) 40 MG tablet Take 1 tablet (40 mg total) by mouth daily. 30 tablet 1  . propranolol ER (INDERAL LA) 60 MG 24 hr capsule Take 60 mg by mouth daily.     . Multiple Vitamin (MULTIVITAMIN ADULT) TABS TAKE ONE TABLET BY MOUTH ONCE DAILY (Patient not taking: Reported on 09/27/2019)     No current facility-administered medications for this visit.    Review of Systems  Constitutional: Negative for chills, diaphoresis, fever, malaise/fatigue and weight loss (up 2 lbs).       Feels "alright."  HENT: Negative.  Negative for congestion, ear discharge, ear pain, hearing loss, nosebleeds, sinus pain, sore throat and tinnitus.   Eyes: Negative.  Negative for blurred vision and double vision.  Respiratory: Negative.  Negative for cough, hemoptysis and shortness of breath.   Cardiovascular: Negative for chest pain, palpitations, orthopnea and leg swelling.  Gastrointestinal: Negative for abdominal pain, blood in stool, constipation, diarrhea, heartburn, melena, nausea and vomiting.       Eating better.  Genitourinary: Negative for dysuria, frequency and urgency.       H/o kidney stones- no recent symptoms.  Musculoskeletal: Negative for back pain, falls, joint pain and myalgias.  Skin: Negative.  Negative for itching and rash.  Neurological: Negative for dizziness, tingling, sensory change,  speech change, focal weakness, weakness and headaches.  Endo/Heme/Allergies: Does not bruise/bleed easily.       Diabetes  Psychiatric/Behavioral: Positive for substance abuse (alcohol; cut back to 2 shots daily, ETOH level 363 on 07/27/2019). Negative for depression and memory loss. The patient has insomnia (wakes up and has trouble falling back asleep). The patient is not nervous/anxious.        Father recently passed.  All other systems reviewed and are negative.  Performance status (ECOG): 1  Vitals: Blood pressure 118/70, pulse 81, temperature 98 F (36.7 C), temperature source Tympanic, resp. rate 18, height 5\' 11"  (1.803 m), weight 224 lb 5.1 oz (101.8 kg), SpO2 99 %.  Physical Exam Vitals and nursing note reviewed.  Constitutional:      General: He is not in acute distress.    Appearance: He is well-developed. He is not diaphoretic.  HENT:     Head: Normocephalic and atraumatic.     Comments: Short hair.    Mouth/Throat:     Mouth: Mucous membranes are  moist.     Pharynx: Oropharynx is clear.  Eyes:     General: No scleral icterus.    Extraocular Movements: Extraocular movements intact.     Conjunctiva/sclera: Conjunctivae normal.     Pupils: Pupils are equal, round, and reactive to light.  Cardiovascular:     Rate and Rhythm: Normal rate and regular rhythm.     Heart sounds: Normal heart sounds. No murmur heard.   Pulmonary:     Effort: Pulmonary effort is normal. No respiratory distress.     Breath sounds: Normal breath sounds. No wheezing or rales.  Abdominal:     General: There is no distension.     Palpations: Abdomen is soft. There is no hepatomegaly, splenomegaly or mass.     Tenderness: There is no abdominal tenderness. There is no guarding or rebound.  Musculoskeletal:        General: No tenderness. Normal range of motion.     Cervical back: Normal range of motion and neck supple.  Lymphadenopathy:     Head:     Right side of head: No preauricular,  posterior auricular or occipital adenopathy.     Left side of head: No preauricular, posterior auricular or occipital adenopathy.     Cervical: No cervical adenopathy.     Upper Body:     Right upper body: No supraclavicular or axillary adenopathy.     Left upper body: No supraclavicular or axillary adenopathy.     Lower Body: No right inguinal adenopathy. No left inguinal adenopathy.  Skin:    General: Skin is warm and dry.  Neurological:     Mental Status: He is alert and oriented to person, place, and time.  Psychiatric:        Behavior: Behavior normal.        Thought Content: Thought content normal.        Judgment: Judgment normal.    Appointment on 09/27/2019  Component Date Value Ref Range Status  . WBC 09/27/2019 4.1  4.0 - 10.5 K/uL Final  . RBC 09/27/2019 3.39* 4.22 - 5.81 MIL/uL Final  . Hemoglobin 09/27/2019 10.3* 13.0 - 17.0 g/dL Final  . HCT 62/13/086508/31/2021 31.8* 39 - 52 % Final  . MCV 09/27/2019 93.8  80.0 - 100.0 fL Final  . MCH 09/27/2019 30.4  26.0 - 34.0 pg Final  . MCHC 09/27/2019 32.4  30.0 - 36.0 g/dL Final  . RDW 78/46/962908/31/2021 15.1  11.5 - 15.5 % Final  . Platelets 09/27/2019 180  150 - 400 K/uL Final  . nRBC 09/27/2019 0.0  0.0 - 0.2 % Final  . Neutrophils Relative % 09/27/2019 60  % Final  . Neutro Abs 09/27/2019 2.5  1.7 - 7.7 K/uL Final  . Lymphocytes Relative 09/27/2019 24  % Final  . Lymphs Abs 09/27/2019 1.0  0.7 - 4.0 K/uL Final  . Monocytes Relative 09/27/2019 12  % Final  . Monocytes Absolute 09/27/2019 0.5  0 - 1 K/uL Final  . Eosinophils Relative 09/27/2019 2  % Final  . Eosinophils Absolute 09/27/2019 0.1  0 - 0 K/uL Final  . Basophils Relative 09/27/2019 1  % Final  . Basophils Absolute 09/27/2019 0.0  0 - 0 K/uL Final  . Immature Granulocytes 09/27/2019 1  % Final  . Abs Immature Granulocytes 09/27/2019 0.03  0.00 - 0.07 K/uL Final   Performed at [facility], [address] [place], [place] [id number]    Assessment:  George ArthurMichael  Gomez is a 59 y.o.  male with mild pancytopenia. CBCs from 07/31/2013 - 09/03/2017 reveal a hemoglobinfrom 10.1 - 14.7 without trend. MCV has been normal. Plateletshave ranged between 111,000 - 190,000. WBChas ranged between 2000 - 8300. WBC has been consistently low since 06/01/2017. He drinks alcoholdaily.  CBC on 08/08/2019revealed a hematocrit of 35.4, hemoglobin 11.5, MCV 94.5, platelets 149,000, WBC 2000 with an ANC of 1000. Differential included 49% segs, 34% lymphocytes, 14% monocytes, 2% eosinophils, and 1% basophils. Outside labsnote negative/normal peripheral smear, LDH, and SPEP/UPEP.  Work-up on 11/07/2019revealed a hematocrit of 29.6, hemoglobin 9.3, MCV 97.7, WBC 2600 with an ANC of 1200. Differential included 48% segs, 36% lymphs, 14% monocytes, 1% eosinophils, and 1% basophils. . B12was 332 (low normal). Normal studiesincluded: Creatinine (1.05), LFTs, ferritin (257), iron studies, folate (7.4), TSH, copper, zinc, ANA, hepatitis B core antibody, hepatitis B surface antigen, hepatitis C antibody, and HIV testing. Retic was 2.2%. Peripheral smearrevealed no abnormalities. Flow cytometry on 01/23/2019 was negative.  He has B12 deficiencyandfolate deficiency. He began B12 injections on 12/11/2017 (last  09/13/2019). Folatewas 4.1 on 07/13/2018 and 8.9 on 08/10/2018.  Abdomen and pelvic CTon 05/30/2017 revealed hepatic steatosis. Spleen was normal.  He has a history of a GI bleedon 05/30/2017. EGDon 06/01/2017 revealed a normal esophagus, normal stomach, and duodenitis. Biopsy revealed reactive duodenitis without dysplasia or malignancy. Colonoscopyon 10/16/2018 revealed diverticulosis in the entire examined colon. The distal rectum and anal verge were normal on retroflexion view. There was normal mucosa in the entire examined colon. Small bowel enteroscopyon 09/19/2020was entirely normal (esophagus, stomach, duodenum, proximal and  mid-jejunum).Capsule endoscopyon 08/02/2019 revealed a 1 cm non-specific circumscribed lesion with superficial erosion in the distal small bowel, likely an ileum or distal jejunum.  He has a history of elevated iron saturation. Ferritin was 1121 with an iron saturation of 50% on 03/21/2019.  Hemochromatosis assay on 03/31/2019 revealed no mutations (C282Y, H63D, S65C).   [facility].EGDon 07/02/2018 revealedalcoholic gastritis but no evidence of acute bleeding. Stools were heme positive. Herequireda PRBCtransfusion. Hemoglobinrangedbetween 8.4 - 8.7. He was discharged with PPI twice daily.  [facility] with nausea, vomiting and diarrhea.He had been drinking alcohol. Creatininewas 2.61 (baseline 0.83). He was dehydrated. He received IV fluids and electrolyte replacement. Creatinine was 1.77 on09/16/2020.Hemoglobin was 8.5.  [facility] with symptomatic anemia. He underwent small bowel enteroscopy and colonoscopy. He received 2 units of PRBCsand Venofer(400 mg).  He was seen in the [facility] ER on 04/11/2019 with alcohol intoxication.   Symptomatically, he feels "all right".  He is cut down his alcohol consumption to 2 shots a day.  Exam is stable.  Plan: 1.   Labs today: CBC with diff, CMP, ferritin, iron studies, sed rate. 2.Normocytic anemia Hematocrit28.3. Hemoglobin9.0. MCV96.3on 06/21/2019.  Hematocrit 31.6.  Hemoglobin 9.9.  MCV 96.9 on 08/23/2019.  Hematocrit 31.8.  Hemoglobin 10.3.  MCV 93.8 on 09/27/2019.                         Ferritin 638 with an iron saturation of 26 % and a TIBC of 302. Hehas a history of a GI bleed secondary to alcoholic gastritiswith associated melena. EGD and colonoscopyin 09/2020revealed no source of GI bleeding. Capsule endoscopy on  08/02/2019 revealed a 1 cm non-specific circumscribed lesion with superficial erosion in the distal small bowel, likely an ileum or distal jejunum.  MRI enterography is scheduled for 10/12/2019.    Continue to encourage decrease alcohol intake.  Follow-up with GI as scheduled. 3.  B12 deficiency B12 was 593 on 07/01/2018. He receives B12 monthly (last 09/13/2019). Folate is 21.6 on 08/16/2019.             Continue B12 monthly.  Monitor folate annually. 4. Upper GI bleed Patient has a history of GI bleed in 05/2017 and 06/2018. EGD and colonoscopy revealed no source of GI bleeding. Capsuleendoscopy on 08/02/2019 revealed a 1 cm non-specific circumscribed lesion with superficial erosion in the distal small bowel, likely an ileum or distal jejunum. He has a follow-up with GI after MRI enterography. 5. Weight loss Patient has gained 2 pounds.  Weight fluctuates based on caloric intake..              Caloric intake is decreased when he drinks alcohol.             Patient notes weight fluctuates from 207 pounds to 217 pounds.             Continue to monitor. 6.   Elevated LFTs, resolved             AST 344.  ALT 187.  Bilirubin 0.7.  Alkaline phosphatase 84 on 08/16/2019.             AST   80.  ALT   75.  Bilirubin 0.6.  Alkaline phosphatase 97 on 08/23/2019.  AST   39.    ALT 35.  Bilirubin 0.6.  Alkaline phosphatase 81 on 09/27/2019  Hepatitis B and C serologies negative on 08/16/2019.  Etiology secondary to alcohol.               Encourage decrease alcohol intake. 7.   Please call [facility] re: shot given today (? Hepatitis B vaccine). 8.   RTC monthly x 6 for B12 (last 09/13/2019). 9.   RTC in 6 weeks (on day he receives B12) for labs (CBC). 10.   RTC in 3 months for MD assessment and labs (CBC with diff, ferritin, iron studies).  I discussed the assessment and  treatment plan with the patient.  The patient was provided an opportunity to ask questions and all were answered.  The patient agreed with the plan and demonstrated an understanding of the instructions.  The patient was advised to call back or seek an in person evaluation if the symptoms worsen or if the condition fails to improve as anticipated.  I provided 16 minutes of face-to-face time during this this encounter and > 50% was spent counseling as documented under my assessment and plan.   Nelva Nay, MD, PhD  09/27/2019, 11:53 AM  I, Danella Penton Tufford, am acting as a Neurosurgeon for [employer]. Merlene Pulling, MD.   I, Melissa C. Merlene Pulling, MD, have reviewed the above documentation for accuracy and completeness, and I agree with the above.

## 2019-09-27 ENCOUNTER — Encounter: Payer: Self-pay | Admitting: Hematology and Oncology

## 2019-09-27 ENCOUNTER — Inpatient Hospital Stay: Payer: Medicare Other

## 2019-09-27 ENCOUNTER — Inpatient Hospital Stay (HOSPITAL_BASED_OUTPATIENT_CLINIC_OR_DEPARTMENT_OTHER): Payer: Medicare Other | Admitting: Hematology and Oncology

## 2019-09-27 ENCOUNTER — Other Ambulatory Visit: Payer: Self-pay

## 2019-09-27 VITALS — BP 118/70 | HR 81 | Temp 98.0°F | Resp 18 | Ht 71.0 in | Wt 224.3 lb

## 2019-09-27 DIAGNOSIS — D649 Anemia, unspecified: Secondary | ICD-10-CM

## 2019-09-27 DIAGNOSIS — E538 Deficiency of other specified B group vitamins: Secondary | ICD-10-CM

## 2019-09-27 DIAGNOSIS — D696 Thrombocytopenia, unspecified: Secondary | ICD-10-CM

## 2019-09-27 LAB — CBC WITH DIFFERENTIAL/PLATELET
Abs Immature Granulocytes: 0.03 10*3/uL (ref 0.00–0.07)
Basophils Absolute: 0 10*3/uL (ref 0.0–0.1)
Basophils Relative: 1 %
Eosinophils Absolute: 0.1 10*3/uL (ref 0.0–0.5)
Eosinophils Relative: 2 %
HCT: 31.8 % — ABNORMAL LOW (ref 39.0–52.0)
Hemoglobin: 10.3 g/dL — ABNORMAL LOW (ref 13.0–17.0)
Immature Granulocytes: 1 %
Lymphocytes Relative: 24 %
Lymphs Abs: 1 10*3/uL (ref 0.7–4.0)
MCH: 30.4 pg (ref 26.0–34.0)
MCHC: 32.4 g/dL (ref 30.0–36.0)
MCV: 93.8 fL (ref 80.0–100.0)
Monocytes Absolute: 0.5 10*3/uL (ref 0.1–1.0)
Monocytes Relative: 12 %
Neutro Abs: 2.5 10*3/uL (ref 1.7–7.7)
Neutrophils Relative %: 60 %
Platelets: 180 10*3/uL (ref 150–400)
RBC: 3.39 MIL/uL — ABNORMAL LOW (ref 4.22–5.81)
RDW: 15.1 % (ref 11.5–15.5)
WBC: 4.1 10*3/uL (ref 4.0–10.5)
nRBC: 0 % (ref 0.0–0.2)

## 2019-09-27 LAB — COMPREHENSIVE METABOLIC PANEL
ALT: 35 U/L (ref 0–44)
AST: 39 U/L (ref 15–41)
Albumin: 3.9 g/dL (ref 3.5–5.0)
Alkaline Phosphatase: 81 U/L (ref 38–126)
Anion gap: 10 (ref 5–15)
BUN: 23 mg/dL (ref 8–23)
CO2: 23 mmol/L (ref 22–32)
Calcium: 9.1 mg/dL (ref 8.9–10.3)
Chloride: 105 mmol/L (ref 98–111)
Creatinine, Ser: 1.06 mg/dL (ref 0.61–1.24)
GFR calc Af Amer: >60 mL/min (ref >60)
GFR calc non Af Amer: >60 mL/min (ref >60)
Glucose, Bld: 158 mg/dL — ABNORMAL HIGH (ref 70–99)
Potassium: 3.6 mmol/L (ref 3.5–5.1)
Sodium: 138 mmol/L (ref 135–145)
Total Bilirubin: 0.6 mg/dL (ref 0.3–1.2)
Total Protein: 7.9 g/dL (ref 6.5–8.1)

## 2019-09-27 LAB — IRON AND TIBC
Iron: 79 ug/dL (ref 45–182)
Saturation Ratios: 26 % (ref 17.9–39.5)
TIBC: 302 ug/dL (ref 250–450)
UIBC: 223 ug/dL

## 2019-09-27 LAB — SEDIMENTATION RATE: Sed Rate: 64 mm/hr — ABNORMAL HIGH (ref 0–20)

## 2019-09-27 LAB — FERRITIN: Ferritin: 638 ng/mL — ABNORMAL HIGH (ref 24–336)

## 2019-10-07 ENCOUNTER — Telehealth (HOSPITAL_COMMUNITY): Payer: Self-pay

## 2019-10-11 ENCOUNTER — Ambulatory Visit: Payer: Medicare Other

## 2019-10-12 ENCOUNTER — Other Ambulatory Visit: Payer: Medicare Other

## 2019-10-12 ENCOUNTER — Telehealth: Payer: Self-pay

## 2019-10-12 ENCOUNTER — Ambulatory Visit: Admission: RE | Admit: 2019-10-12 | Payer: Medicare Other | Source: Ambulatory Visit

## 2019-10-12 NOTE — Telephone Encounter (Signed)
Patient verbalized understanding  

## 2019-10-12 NOTE — Telephone Encounter (Signed)
Patient states his enterogram was canceled.  When I asked him to call back over there to reschedule it.  He said he was informed that our office had to do so.  Please call back to reschedule.  Thanks,  [place], [place]

## 2019-10-12 NOTE — Telephone Encounter (Signed)
Called scheduled to find out what happen. They state patient had to arrive  At 7 this morning like he was supposed to and know one arrived at the MRI place yet. She states that she did not know why they made that appointment then. Got patient rescheduled for 10/31/2019 arrive at 2:00 for a 3:00 scan. Nothing to eat or drink 4 hours before the scan.

## 2019-10-15 ENCOUNTER — Ambulatory Visit: Payer: Medicare Other

## 2019-10-15 ENCOUNTER — Other Ambulatory Visit: Payer: Medicare Other

## 2019-10-17 ENCOUNTER — Inpatient Hospital Stay: Payer: Medicare Other | Attending: Hematology and Oncology

## 2019-10-31 ENCOUNTER — Other Ambulatory Visit: Payer: Self-pay

## 2019-10-31 ENCOUNTER — Ambulatory Visit
Admission: RE | Admit: 2019-10-31 | Discharge: 2019-10-31 | Disposition: A | Discharge status: Home / Self Care | Payer: Medicare Other | Source: Ambulatory Visit | Attending: Gastroenterology | Admitting: Gastroenterology

## 2019-10-31 DIAGNOSIS — K639 Disease of intestine, unspecified: Secondary | ICD-10-CM

## 2019-10-31 MED ORDER — GADOBUTROL 1 MMOL/ML IV SOLN
10.0000 mL | Freq: Once | INTRAVENOUS | Status: AC | PRN
  Administered 2019-10-31: 10 mL via INTRAVENOUS

## 2019-11-01 ENCOUNTER — Telehealth: Payer: Self-pay

## 2019-11-01 NOTE — Telephone Encounter (Signed)
Patient verbalized understanding results  

## 2019-11-01 NOTE — Telephone Encounter (Signed)
-----   Message from George Reil, MD sent at 11/01/2019  2:15 PM EDT ----- Please inform patient that there was no small bowel lesion detected based on MRI  RV

## 2019-11-08 ENCOUNTER — Ambulatory Visit: Payer: Medicare Other

## 2019-11-14 ENCOUNTER — Inpatient Hospital Stay: Payer: Medicare Other | Attending: Hematology and Oncology

## 2019-11-14 ENCOUNTER — Inpatient Hospital Stay: Payer: Medicare Other

## 2019-11-14 ENCOUNTER — Other Ambulatory Visit: Payer: Self-pay

## 2019-11-14 DIAGNOSIS — E538 Deficiency of other specified B group vitamins: Secondary | ICD-10-CM | POA: Insufficient documentation

## 2019-11-14 DIAGNOSIS — D649 Anemia, unspecified: Secondary | ICD-10-CM

## 2019-11-14 LAB — CBC WITH DIFFERENTIAL/PLATELET
Abs Immature Granulocytes: 0.03 10*3/uL (ref 0.00–0.07)
Basophils Absolute: 0 10*3/uL (ref 0.0–0.1)
Basophils Relative: 1 %
Eosinophils Absolute: 0.1 10*3/uL (ref 0.0–0.5)
Eosinophils Relative: 3 %
HCT: 33.1 % — ABNORMAL LOW (ref 39.0–52.0)
Hemoglobin: 10.6 g/dL — ABNORMAL LOW (ref 13.0–17.0)
Immature Granulocytes: 1 %
Lymphocytes Relative: 20 %
Lymphs Abs: 1 10*3/uL (ref 0.7–4.0)
MCH: 29.5 pg (ref 26.0–34.0)
MCHC: 32 g/dL (ref 30.0–36.0)
MCV: 92.2 fL (ref 80.0–100.0)
Monocytes Absolute: 0.7 10*3/uL (ref 0.1–1.0)
Monocytes Relative: 15 %
Neutro Abs: 2.9 10*3/uL (ref 1.7–7.7)
Neutrophils Relative %: 60 %
Platelets: 128 10*3/uL — ABNORMAL LOW (ref 150–400)
RBC: 3.59 MIL/uL — ABNORMAL LOW (ref 4.22–5.81)
RDW: 15.2 % (ref 11.5–15.5)
WBC: 4.8 10*3/uL (ref 4.0–10.5)
nRBC: 0 % (ref 0.0–0.2)

## 2019-11-14 MED ORDER — CYANOCOBALAMIN 1000 MCG/ML IJ SOLN
1000.0000 ug | Freq: Once | INTRAMUSCULAR | Status: AC
  Administered 2019-11-14: 1000 ug via INTRAMUSCULAR
  Filled 2019-11-14: qty 1

## 2019-12-06 ENCOUNTER — Ambulatory Visit: Payer: Medicare Other

## 2019-12-15 ENCOUNTER — Inpatient Hospital Stay: Payer: Medicare Other | Attending: Hematology and Oncology

## 2019-12-15 DIAGNOSIS — E538 Deficiency of other specified B group vitamins: Secondary | ICD-10-CM | POA: Insufficient documentation

## 2019-12-15 DIAGNOSIS — R5383 Other fatigue: Secondary | ICD-10-CM | POA: Insufficient documentation

## 2019-12-15 DIAGNOSIS — D649 Anemia, unspecified: Secondary | ICD-10-CM | POA: Insufficient documentation

## 2019-12-15 DIAGNOSIS — R634 Abnormal weight loss: Secondary | ICD-10-CM | POA: Insufficient documentation

## 2019-12-26 NOTE — Progress Notes (Signed)
[facility]  [address], [address] [place], [place] [address] Phone: [phone]  Fax: [phone]   Clinic Day:  12/27/2019  Referring physician: [facility]  Chief Complaint: George ArthurMichael Gomez is a 59 y.o. male with normocyticanemia and B12 deficiency who is seen for 3 month assessment.   HPI: The patient was last seen in the hematology clinic on 09/27/2019. At that time, he felt "alright".  He had cut down his alcohol consumption to 2 shots a day.  Exam was stable. Hematocrit was 31.8, hemoglobin 10.3, MCV 93.8, platelets 180,000, WBC 4,100. CMP was normal. Ferritin was 638 with an iron saturation of 26% and a TIBC of 302. Sed rate was 64.   MRI abdomen and pelvis enterography on 10/31/2019 was limited. There were no discrete bowel masses. There was no bowel wall thickening or other evidence of active bowel inflammation. There was no lymphadenopathy. There was marked diffuse colonic diverticulosis.  CBC on 11/14/2019 revealed a hematocrit of 33.1, hemoglobin 10.6, MCV 92.2, platelets 128,000, WBC 4,800.  He received a vitamin B12 injection.  During the interim, he has been doing well. He has been drinking less because he started working again. He has gained weight because he started eating 3 meals per day. Previously, he was only eating two meals. He has trouble sleeping due to the time change.  The patient has itchy spots all over his body. He saw dermatology and they gave him Claritin. He is going to get a biopsy.   Past Medical History:  Diagnosis Date  . Acute renal failure (ARF) (HCC) 08/28/2016  . Acute renal failure (HCC) 10/12/2018  . Anemia   . B12 deficiency 12/11/2017  . Clostridium difficile diarrhea 03/18/2016  . Diabetes mellitus without complication (HCC)   . Diverticulitis    Pt states diverticulitis  . EtOH dependence (HCC)   . Folate deficiency 07/25/2018  . GI bleed 05/30/2017  . Hypercholesteremia   . Hypertension   .  Melena 10/21/2018  . Weight loss 09/15/2018    Past Surgical History:  Procedure Laterality Date  . COLONOSCOPY WITH PROPOFOL N/A 10/16/2018   Procedure: COLONOSCOPY WITH PROPOFOL;  Surgeon: Toney ReilVanga, Rohini Reddy, MD;  [facility];  Service: Gastroenterology;  Laterality: N/A;  . ENTEROSCOPY N/A 10/16/2018   Procedure: ENTEROSCOPY;  Surgeon: Toney ReilVanga, Rohini Reddy, MD;  [facility];  Service: Gastroenterology;  Laterality: N/A;  . ESOPHAGOGASTRODUODENOSCOPY N/A 07/02/2018   Procedure: ESOPHAGOGASTRODUODENOSCOPY (EGD);  Surgeon: Toney ReilVanga, Rohini Reddy, MD;  [facility];  Service: Gastroenterology;  Laterality: N/A;  . ESOPHAGOGASTRODUODENOSCOPY (EGD) WITH PROPOFOL N/A 06/01/2017   Procedure: ESOPHAGOGASTRODUODENOSCOPY (EGD) WITH PROPOFOL;  Surgeon: Wyline MoodAnna, Kiran, MD;  [facility];  Service: Gastroenterology;  Laterality: N/A;  . GIVENS CAPSULE STUDY N/A 10/17/2018   Procedure: GIVENS CAPSULE STUDY;  Surgeon: Toney ReilVanga, Rohini Reddy, MD;  [facility];  Service: Gastroenterology;  Laterality: N/A;  . GIVENS CAPSULE STUDY N/A 08/02/2019   Procedure: GIVENS CAPSULE STUDY;  Surgeon: Toney ReilVanga, Rohini Reddy, MD;  [facility];  Service: Gastroenterology;  Laterality: N/A;  . none      Family History  Problem Relation Age of Onset  . Rheum arthritis Mother   . Diabetes Father   . Heart disease Father   . Cancer Father   . Cancer Maternal Grandfather   . Cancer Paternal Grandfather     Social History:  reports that he has never smoked. He has never used smokeless tobacco. He reports current alcohol use of  about 6.0 standard drinks of alcohol per week. He reports that he does not use drugs. He drankliquor 2-3 x/day. He is drinking less than he did in the past (moonshine), about 2-3 shots daily. He did "crack" 10-15 years ago. He lives in [place]. Is no longer socially drinking, He says that if can overcome doing crack he can overcome drinking  alcohol. 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 The patient is alone today.    Allergies: No Known Allergies  Current Medications: Current Outpatient Medications  Medication Sig Dispense Refill  . allopurinol (ZYLOPRIM) 300 MG tablet Take 300 mg by mouth daily.     Marland Kitchen amLODipine (NORVASC) 10 MG tablet Take 10 mg by mouth daily.    Marland Kitchen atorvastatin (LIPITOR) 40 MG tablet Take 40 mg by mouth daily.     . clobetasol cream (TEMOVATE) 0.05 % Apply topically.    . gabapentin (NEURONTIN) 300 MG capsule Take 300 mg by mouth 3 (three) times daily.   3  . loratadine (CLARITIN) 10 MG tablet Take 10 mg by mouth daily.    Marland Kitchen losartan (COZAAR) 100 MG tablet Take 100 mg by mouth daily.    . metFORMIN (GLUCOPHAGE-XR) 500 MG 24 hr tablet Take 1,000 mg by mouth daily.    . Multiple Vitamin (MULTIVITAMIN ADULT PO) Gnp Century Mature Multivitamin Tab 125c    . Multiple Vitamin (MULTIVITAMIN ADULT PO) TAKE ONE TABLET BY MOUTH ONCE DAILY    . naltrexone (DEPADE) 50 MG tablet Take 50 mg by mouth daily.     . propranolol ER (INDERAL LA) 60 MG 24 hr capsule Take 60 mg by mouth daily.     . pantoprazole (PROTONIX) 40 MG tablet Take 1 tablet (40 mg total) by mouth daily. 30 tablet 1   No current facility-administered medications for this visit.    Review of Systems  Constitutional: Negative for chills, diaphoresis, fever, malaise/fatigue and weight loss (up 5 lbs).  HENT: Negative.  Negative for congestion, ear discharge, ear pain, hearing loss, nosebleeds, sinus pain, sore throat and tinnitus.   Eyes: Negative.  Negative for blurred vision and double vision.  Respiratory: Negative.  Negative for cough, hemoptysis and shortness of breath.   Cardiovascular: Negative.  Negative for chest pain, palpitations, orthopnea and leg swelling.  Gastrointestinal: Negative.  Negative for abdominal pain, blood in stool, constipation, diarrhea, heartburn, melena, nausea and vomiting.       Started eating 3 meals per  day.  Genitourinary: Negative for dysuria, frequency and urgency.       H/o kidney stones- no recent symptoms.  Musculoskeletal: Negative.  Negative for back pain, joint pain, myalgias and neck pain.  Skin: Positive for itching and rash (spots all over his body, on claritin).  Neurological: Negative for dizziness, tingling, sensory change, speech change, focal weakness, weakness and headaches.  Endo/Heme/Allergies: Does not bruise/bleed easily.       Diabetes  Psychiatric/Behavioral: Positive for substance abuse (alcohol, has cut back). Negative for depression and memory loss. The patient has insomnia (due to daylight savings). The patient is not nervous/anxious.   All other systems reviewed and are negative.  Performance status (ECOG): 1  Vitals: Blood pressure 128/68, pulse 87, temperature 97.7 F (36.5 C), temperature source Tympanic, resp. rate 18, weight 229 lb 4.5 oz (104 kg), SpO2 97 %.  Physical Exam Vitals and nursing note reviewed.  Constitutional:      General: He is not in acute distress.    Appearance: He is well-developed.  He is not diaphoretic.  HENT:     Head: Normocephalic and atraumatic.     Comments: Short hair.    Mouth/Throat:     Mouth: Mucous membranes are moist.     Pharynx: Oropharynx is clear.  Eyes:     General: No scleral icterus.    Extraocular Movements: Extraocular movements intact.     Conjunctiva/sclera: Conjunctivae normal.     Pupils: Pupils are equal, round, and reactive to light.  Cardiovascular:     Rate and Rhythm: Normal rate and regular rhythm.     Heart sounds: Normal heart sounds. No murmur heard.   Pulmonary:     Effort: Pulmonary effort is normal. No respiratory distress.     Breath sounds: Normal breath sounds. No wheezing or rales.  Abdominal:     General: There is no distension.     Palpations: Abdomen is soft. There is no hepatomegaly, splenomegaly or mass.     Tenderness: There is no abdominal tenderness. There is no  guarding or rebound.  Musculoskeletal:        General: No tenderness. Normal range of motion.     Cervical back: Normal range of motion and neck supple.     Right lower leg: Edema (chronic) present.  Lymphadenopathy:     Head:     Right side of head: No preauricular, posterior auricular or occipital adenopathy.     Left side of head: No preauricular, posterior auricular or occipital adenopathy.     Cervical: No cervical adenopathy.     Upper Body:     Right upper body: No supraclavicular or axillary adenopathy.     Left upper body: No supraclavicular or axillary adenopathy.     Lower Body: No right inguinal adenopathy. No left inguinal adenopathy.  Skin:    General: Skin is warm and dry.     Comments: Spots on abdomen  Neurological:     Mental Status: He is alert and oriented to person, place, and time.  Psychiatric:        Behavior: Behavior normal.        Thought Content: Thought content normal.        Judgment: Judgment normal.    Appointment on 12/27/2019  Component Date Value Ref Range Status  . Sodium 12/27/2019 135  135 - 145 mmol/L Final  . Potassium 12/27/2019 3.7  3.5 - 5.1 mmol/L Final  . Chloride 12/27/2019 101  98 - 111 mmol/L Final  . CO2 12/27/2019 26  22 - 32 mmol/L Final  . Glucose, Bld 12/27/2019 139* 70 - 99 mg/dL Final   Glucose reference range applies only to samples taken after fasting for at least 8 hours.  . BUN 12/27/2019 25* 8 - 23 mg/dL Final  . Creatinine, Ser 12/27/2019 1.23  0.61 - 1.24 mg/dL Final  . Calcium 16/10/9602 9.2  8.9 - 10.3 mg/dL Final  . Total Protein 12/27/2019 8.1  6.5 - 8.1 g/dL Final  . Albumin 54/09/8117 4.0  3.5 - 5.0 g/dL Final  . AST 14/78/2956 43* 15 - 41 U/L Final  . ALT 12/27/2019 34  0 - 44 U/L Final  . Alkaline Phosphatase 12/27/2019 98  38 - 126 U/L Final  . Total Bilirubin 12/27/2019 0.7  0.3 - 1.2 mg/dL Final  . GFR, Estimated 12/27/2019 >60  >60 mL/min Final   Comment: (NOTE) Calculated using the CKD-EPI  Creatinine Equation (2021)   . Anion gap 12/27/2019 8  5 - 15 Final   Performed at  [facility], [address] [place], [place] [address]  . WBC 12/27/2019 3.5* 4.0 - 10.5 K/uL Final  . RBC 12/27/2019 3.73* 4.22 - 5.81 MIL/uL Final  . Hemoglobin 12/27/2019 11.1* 13.0 - 17.0 g/dL Final  . HCT 02/04/3233 34.4* 39 - 52 % Final  . MCV 12/27/2019 92.2  80.0 - 100.0 fL Final  . MCH 12/27/2019 29.8  26.0 - 34.0 pg Final  . MCHC 12/27/2019 32.3  30.0 - 36.0 g/dL Final  . RDW 57/32/2025 15.1  11.5 - 15.5 % Final  . Platelets 12/27/2019 165  150 - 400 K/uL Final  . nRBC 12/27/2019 0.0  0.0 - 0.2 % Final  . Neutrophils Relative % 12/27/2019 59  % Final  . Neutro Abs 12/27/2019 2.1  1.7 - 7.7 K/uL Final  . Lymphocytes Relative 12/27/2019 22  % Final  . Lymphs Abs 12/27/2019 0.8  0.7 - 4.0 K/uL Final  . Monocytes Relative 12/27/2019 14  % Final  . Monocytes Absolute 12/27/2019 0.5  0.1 - 1.0 K/uL Final  . Eosinophils Relative 12/27/2019 3  % Final  . Eosinophils Absolute 12/27/2019 0.1  0.0 - 0.5 K/uL Final  . Basophils Relative 12/27/2019 1  % Final  . Basophils Absolute 12/27/2019 0.0  0.0 - 0.1 K/uL Final  . Immature Granulocytes 12/27/2019 1  % Final  . Abs Immature Granulocytes 12/27/2019 0.03  0.00 - 0.07 K/uL Final   Performed at [facility], [address] [place], [place] [address]    Assessment:  Quinterius Gaida is a 59 y.o. male with mild pancytopenia. CBCs from 07/31/2013 - 09/03/2017 reveal a hemoglobinfrom 10.1 - 14.7 without trend. MCV has been normal. Plateletshave ranged between 111,000 - 190,000. WBChas ranged between 2000 - 8300. WBC has been consistently low since 06/01/2017. He drinks alcoholdaily.  CBC on 08/08/2019revealed a hematocrit of 35.4, hemoglobin 11.5, MCV 94.5, platelets 149,000, WBC 2000 with an ANC of 1000. Differential included 49% segs, 34% lymphocytes, 14% monocytes, 2% eosinophils, and 1% basophils. Outside labsnote  negative/normal peripheral smear, LDH, and SPEP/UPEP.  Work-up on 11/07/2019revealed a hematocrit of 29.6, hemoglobin 9.3, MCV 97.7, WBC 2600 with an ANC of 1200. Differential included 48% segs, 36% lymphs, 14% monocytes, 1% eosinophils, and 1% basophils. . B12was 332 (low normal). Normal studiesincluded: Creatinine (1.05), LFTs, ferritin (257), iron studies, folate (7.4), TSH, copper, zinc, ANA, hepatitis B core antibody, hepatitis B surface antigen, hepatitis C antibody, and HIV testing. Retic was 2.2%. Peripheral smearrevealed no abnormalities. Flow cytometry on 01/23/2019 was negative.  He has B12 deficiencyandfolate deficiency. He began B12 injections on 12/11/2017 (last  11/14/2019). Folatewas 4.1 on 07/13/2018 and 21.6 on 08/16/2019.  Abdomen and pelvic CTon 05/30/2017 revealed hepatic steatosis. Spleen was normal.  He has a history of a GI bleedon 05/30/2017. EGDon 06/01/2017 revealed a normal esophagus, normal stomach, and duodenitis. Biopsy revealed reactive duodenitis without dysplasia or malignancy. Colonoscopyon 10/16/2018 revealed diverticulosis in the entire examined colon. The distal rectum and anal verge were normal on retroflexion view. There was normal mucosa in the entire examined colon. Small bowel enteroscopyon 09/19/2020was entirely normal (esophagus, stomach, duodenum, proximal and mid-jejunum).Capsule endoscopyon 08/02/2019 revealed a 1 cm non-specific circumscribed lesion with superficial erosion in the distal small bowel, likely an ileum or distal jejunum.  MRI abdomen and pelvis enterography on 10/31/2019 was limited. There were no discrete bowel masses. There was no bowel wall thickening or other evidence of active bowel inflammation. There was no lymphadenopathy. There was marked diffuse  colonic diverticulosis.  He has a history of elevated iron saturation. Ferritin was 1121 with an iron saturation of 50% on 03/21/2019.  Hemochromatosis  assay on 03/31/2019 revealed no mutations (C282Y, H63D, S65C).   [facility].EGDon 07/02/2018 revealedalcoholic gastritis but no evidence of acute bleeding. Stools were heme positive. Herequireda PRBCtransfusion. Hemoglobinrangedbetween 8.4 - 8.7. He was discharged with PPI twice daily.  [facility] with nausea, vomiting and diarrhea.He had been drinking alcohol. Creatininewas 2.61 (baseline 0.83). He was dehydrated. He received IV fluids and electrolyte replacement. Creatinine was 1.77 on09/16/2020.Hemoglobin was 8.5.  [facility] with symptomatic anemia. He underwent small bowel enteroscopy and colonoscopy. He received 2 units of PRBCsand Venofer(400 mg).  He was seen in the [facility] ER on 04/11/2019 with alcohol intoxication.   Symptomatically, he has been doing well. He has been drinking less. He has gained weight because he started eating 3 meals per day. He has itchy spots all over his body.   Plan: 1.   Labs today: CBC with diff, ferritin, iron studies 2.Normocytic anemia Hematocrit28.3. Hemoglobin  9.0. MCV96.3on 06/21/2019.  Hematocrit 31.8.  Hemoglobin 10.3.  MCV 93.8 on 09/27/2019.                         Ferritin 638 with an iron saturation of 26 % and a TIBC of 302.  Hematocrit 34.4.  Hemoglobin 11.1.  MCV 92.2 on 12/27/2019.   Ferritin 538 with an iron saturation of 25% and a TIBC of 335. Hehas a history of a GI bleed secondary to alcoholic gastritiswith associated melena. EGD and colonoscopyin 09/2020revealed no source of GI bleeding. Capsule endoscopy on 08/02/2019 revealed a 1 cm non-specific circumscribed lesion with superficial erosion in the distal small bowel, likely an ileum or distal jejunum.             MRI abdomen and pelvis enterography on  10/31/2019 was limited. There were no discrete bowel masses.    There was no bowel wall thickening or other evidence of active bowel inflammation.   CBC continues to improve likely secondary to decrease alcohol intake.  Continue to monitor. 3.  B12 deficiency B12 was 593 on 07/01/2018. He receives B12 monthly (last 11/14/2019). Folate is 21.6 on 08/16/2019.             B12 today and monthly x6.  Monitor folate annually 4. Upper GI bleed Patient has a history of GI bleed in 05/2017 and 06/2018. EGD and colonoscopy revealed no source of GI bleeding. Capsuleendoscopy on 08/02/2019 revealed a 1 cm non-specific circumscribed lesion with superficial erosion in the distal small bowel, likely an ileum or distal jejunum. MRI abdomen and pelvis enterography on 10/31/2019 revealed no evidence of bleeding. 5. Weight loss Weight is up 5 pounds.  He is eating better and drinking less alcohol.             Patient notes weight fluctuates from 207 pounds to 217 pounds.             Continue to monitor. 6.   Elevated LFTs, resolved             AST 344.  ALT 187. Bilirubin 0.7.  Alkaline phosphatase 84 on 08/16/2019.  AST   39.  ALT  35.  Bilirubin 0.6.  Alkaline phosphatase 81 on 09/27/2019.  AST   43.  ALT  34.  Bilirubin 0.7.  Alkaline phosphatase 98 on 12/27/2019.  Hepatitis B and C serologies negative on 08/16/2019.  Etiology secondary to alcohol.               Continue to encourage to decrease alcohol intake. 7.   B12 today and monthly x 6. 8.   RTC in 3 months (on day he receives B12) for labs (CBC, AFP). 9.   RTC in 6 months for MD assessment, labs (CBC with diff, ferritin, iron studies), and B12.  I discussed the assessment and treatment plan with the patient.  The patient was provided an opportunity to ask questions and all were answered.  The patient agreed with the plan and demonstrated an  understanding of the instructions.  The patient was advised to call back or seek an in person evaluation if the symptoms worsen or if the condition fails to improve as anticipated.   Nelva Nay, MD, PhD  12/27/2019, 12:02 PM  I, Danella Penton Tufford, am acting as a Neurosurgeon for [employer]. Merlene Pulling, MD.   I, Melissa C. Merlene Pulling, MD, have reviewed the above documentation for accuracy and completeness, and I agree with the above.

## 2019-12-27 ENCOUNTER — Other Ambulatory Visit: Payer: Self-pay | Admitting: Hematology and Oncology

## 2019-12-27 ENCOUNTER — Other Ambulatory Visit: Payer: Self-pay

## 2019-12-27 ENCOUNTER — Inpatient Hospital Stay (HOSPITAL_BASED_OUTPATIENT_CLINIC_OR_DEPARTMENT_OTHER): Payer: Medicare Other | Admitting: Hematology and Oncology

## 2019-12-27 ENCOUNTER — Inpatient Hospital Stay: Payer: Medicare Other

## 2019-12-27 ENCOUNTER — Encounter: Payer: Self-pay | Admitting: Hematology and Oncology

## 2019-12-27 VITALS — BP 128/68 | HR 87 | Temp 97.7°F | Resp 18 | Wt 229.3 lb

## 2019-12-27 DIAGNOSIS — D649 Anemia, unspecified: Secondary | ICD-10-CM

## 2019-12-27 DIAGNOSIS — D696 Thrombocytopenia, unspecified: Secondary | ICD-10-CM | POA: Diagnosis not present

## 2019-12-27 DIAGNOSIS — R5383 Other fatigue: Secondary | ICD-10-CM | POA: Diagnosis not present

## 2019-12-27 DIAGNOSIS — R634 Abnormal weight loss: Secondary | ICD-10-CM | POA: Diagnosis not present

## 2019-12-27 DIAGNOSIS — E538 Deficiency of other specified B group vitamins: Secondary | ICD-10-CM

## 2019-12-27 DIAGNOSIS — R7989 Other specified abnormal findings of blood chemistry: Secondary | ICD-10-CM

## 2019-12-27 LAB — COMPREHENSIVE METABOLIC PANEL
ALT: 34 U/L (ref 0–44)
AST: 43 U/L — ABNORMAL HIGH (ref 15–41)
Albumin: 4 g/dL (ref 3.5–5.0)
Alkaline Phosphatase: 98 U/L (ref 38–126)
Anion gap: 8 (ref 5–15)
BUN: 25 mg/dL — ABNORMAL HIGH (ref 8–23)
CO2: 26 mmol/L (ref 22–32)
Calcium: 9.2 mg/dL (ref 8.9–10.3)
Chloride: 101 mmol/L (ref 98–111)
Creatinine, Ser: 1.23 mg/dL (ref 0.61–1.24)
GFR, Estimated: >60 mL/min (ref >60)
Glucose, Bld: 139 mg/dL — ABNORMAL HIGH (ref 70–99)
Potassium: 3.7 mmol/L (ref 3.5–5.1)
Sodium: 135 mmol/L (ref 135–145)
Total Bilirubin: 0.7 mg/dL (ref 0.3–1.2)
Total Protein: 8.1 g/dL (ref 6.5–8.1)

## 2019-12-27 LAB — CBC WITH DIFFERENTIAL/PLATELET
Abs Immature Granulocytes: 0.03 10*3/uL (ref 0.00–0.07)
Basophils Absolute: 0 10*3/uL (ref 0.0–0.1)
Basophils Relative: 1 %
Eosinophils Absolute: 0.1 10*3/uL (ref 0.0–0.5)
Eosinophils Relative: 3 %
HCT: 34.4 % — ABNORMAL LOW (ref 39.0–52.0)
Hemoglobin: 11.1 g/dL — ABNORMAL LOW (ref 13.0–17.0)
Immature Granulocytes: 1 %
Lymphocytes Relative: 22 %
Lymphs Abs: 0.8 10*3/uL (ref 0.7–4.0)
MCH: 29.8 pg (ref 26.0–34.0)
MCHC: 32.3 g/dL (ref 30.0–36.0)
MCV: 92.2 fL (ref 80.0–100.0)
Monocytes Absolute: 0.5 10*3/uL (ref 0.1–1.0)
Monocytes Relative: 14 %
Neutro Abs: 2.1 10*3/uL (ref 1.7–7.7)
Neutrophils Relative %: 59 %
Platelets: 165 10*3/uL (ref 150–400)
RBC: 3.73 MIL/uL — ABNORMAL LOW (ref 4.22–5.81)
RDW: 15.1 % (ref 11.5–15.5)
WBC: 3.5 10*3/uL — ABNORMAL LOW (ref 4.0–10.5)
nRBC: 0 % (ref 0.0–0.2)

## 2019-12-27 LAB — IRON AND TIBC
Iron: 82 ug/dL (ref 45–182)
Saturation Ratios: 25 % (ref 17.9–39.5)
TIBC: 335 ug/dL (ref 250–450)
UIBC: 253 ug/dL

## 2019-12-27 LAB — FERRITIN: Ferritin: 538 ng/mL — ABNORMAL HIGH (ref 24–336)

## 2019-12-27 MED ORDER — CYANOCOBALAMIN 1000 MCG/ML IJ SOLN
1000.0000 ug | Freq: Once | INTRAMUSCULAR | Status: AC
  Administered 2019-12-27: 1000 ug via INTRAMUSCULAR

## 2019-12-27 NOTE — Progress Notes (Signed)
Patient has spots all over body that itch, PCP gave clairitin, he goes back for a biopsy. He would like to get B12 shot today since he missed the last one.

## 2020-01-16 ENCOUNTER — Inpatient Hospital Stay: Payer: Medicare Other

## 2020-01-24 ENCOUNTER — Inpatient Hospital Stay: Payer: Medicare Other | Attending: Hematology and Oncology

## 2020-02-13 ENCOUNTER — Inpatient Hospital Stay: Payer: Medicare Other

## 2020-02-21 ENCOUNTER — Other Ambulatory Visit: Payer: Self-pay

## 2020-02-21 ENCOUNTER — Inpatient Hospital Stay: Payer: Medicare Other | Attending: Hematology and Oncology

## 2020-02-21 DIAGNOSIS — E538 Deficiency of other specified B group vitamins: Secondary | ICD-10-CM | POA: Diagnosis present

## 2020-02-21 MED ORDER — CYANOCOBALAMIN 1000 MCG/ML IJ SOLN
1000.0000 ug | Freq: Once | INTRAMUSCULAR | Status: AC
  Administered 2020-02-21: 1000 ug via INTRAMUSCULAR
  Filled 2020-02-21: qty 1

## 2020-03-15 ENCOUNTER — Inpatient Hospital Stay: Payer: Medicare Other

## 2020-03-20 ENCOUNTER — Inpatient Hospital Stay: Payer: Medicare Other | Attending: Hematology and Oncology

## 2020-03-20 ENCOUNTER — Inpatient Hospital Stay: Payer: Medicare Other

## 2020-03-20 ENCOUNTER — Other Ambulatory Visit: Payer: Self-pay

## 2020-03-20 DIAGNOSIS — E538 Deficiency of other specified B group vitamins: Secondary | ICD-10-CM | POA: Insufficient documentation

## 2020-03-20 DIAGNOSIS — R7989 Other specified abnormal findings of blood chemistry: Secondary | ICD-10-CM

## 2020-03-20 DIAGNOSIS — D649 Anemia, unspecified: Secondary | ICD-10-CM

## 2020-03-20 DIAGNOSIS — D696 Thrombocytopenia, unspecified: Secondary | ICD-10-CM

## 2020-03-20 LAB — CBC
HCT: 34.3 % — ABNORMAL LOW (ref 39.0–52.0)
Hemoglobin: 10.7 g/dL — ABNORMAL LOW (ref 13.0–17.0)
MCH: 28.4 pg (ref 26.0–34.0)
MCHC: 31.2 g/dL (ref 30.0–36.0)
MCV: 91 fL (ref 80.0–100.0)
Platelets: 154 10*3/uL (ref 150–400)
RBC: 3.77 MIL/uL — ABNORMAL LOW (ref 4.22–5.81)
RDW: 15.8 % — ABNORMAL HIGH (ref 11.5–15.5)
WBC: 3.3 10*3/uL — ABNORMAL LOW (ref 4.0–10.5)
nRBC: 0 % (ref 0.0–0.2)

## 2020-03-20 MED ORDER — CYANOCOBALAMIN 1000 MCG/ML IJ SOLN
1000.0000 ug | Freq: Once | INTRAMUSCULAR | Status: AC
  Administered 2020-03-20: 1000 ug via INTRAMUSCULAR
  Filled 2020-03-20: qty 1

## 2020-03-21 LAB — AFP TUMOR MARKER: AFP, Serum, Tumor Marker: 5.4 ng/mL (ref 0.0–8.3)

## 2020-04-17 ENCOUNTER — Other Ambulatory Visit: Payer: Self-pay

## 2020-04-17 ENCOUNTER — Inpatient Hospital Stay: Payer: Medicare Other | Attending: Hematology and Oncology

## 2020-04-17 VITALS — BP 134/72 | HR 100 | Temp 98.9°F | Resp 18

## 2020-04-17 DIAGNOSIS — E538 Deficiency of other specified B group vitamins: Secondary | ICD-10-CM | POA: Diagnosis present

## 2020-04-17 MED ORDER — CYANOCOBALAMIN 1000 MCG/ML IJ SOLN
1000.0000 ug | Freq: Once | INTRAMUSCULAR | Status: AC
  Administered 2020-04-17: 1000 ug via INTRAMUSCULAR
  Filled 2020-04-17: qty 1

## 2020-04-17 NOTE — Progress Notes (Signed)
Patient tolerated B 12 injection well, no concerns voiced. Patient discharged. Stable.

## 2020-05-15 ENCOUNTER — Inpatient Hospital Stay: Payer: Medicare Other | Attending: Hematology and Oncology

## 2020-06-06 ENCOUNTER — Other Ambulatory Visit: Payer: Self-pay

## 2020-06-06 DIAGNOSIS — E538 Deficiency of other specified B group vitamins: Secondary | ICD-10-CM

## 2020-06-12 ENCOUNTER — Encounter: Payer: Self-pay | Admitting: Nurse Practitioner

## 2020-06-12 ENCOUNTER — Inpatient Hospital Stay: Payer: Medicare Other

## 2020-06-12 ENCOUNTER — Inpatient Hospital Stay: Payer: Medicare Other | Attending: Nurse Practitioner | Admitting: Nurse Practitioner

## 2020-06-12 ENCOUNTER — Other Ambulatory Visit: Payer: Self-pay

## 2020-06-12 VITALS — BP 115/73 | HR 89 | Temp 99.3°F | Resp 20 | Wt 231.2 lb

## 2020-06-12 DIAGNOSIS — E538 Deficiency of other specified B group vitamins: Secondary | ICD-10-CM

## 2020-06-12 DIAGNOSIS — D649 Anemia, unspecified: Secondary | ICD-10-CM

## 2020-06-12 DIAGNOSIS — R7989 Other specified abnormal findings of blood chemistry: Secondary | ICD-10-CM | POA: Diagnosis not present

## 2020-06-12 LAB — CBC WITH DIFFERENTIAL/PLATELET
Abs Immature Granulocytes: 0.04 10*3/uL (ref 0.00–0.07)
Basophils Absolute: 0 10*3/uL (ref 0.0–0.1)
Basophils Relative: 1 %
Eosinophils Absolute: 0.1 10*3/uL (ref 0.0–0.5)
Eosinophils Relative: 3 %
HCT: 29.1 % — ABNORMAL LOW (ref 39.0–52.0)
Hemoglobin: 9.2 g/dL — ABNORMAL LOW (ref 13.0–17.0)
Immature Granulocytes: 1 %
Lymphocytes Relative: 26 %
Lymphs Abs: 1 10*3/uL (ref 0.7–4.0)
MCH: 29.1 pg (ref 26.0–34.0)
MCHC: 31.6 g/dL (ref 30.0–36.0)
MCV: 92.1 fL (ref 80.0–100.0)
Monocytes Absolute: 0.6 10*3/uL (ref 0.1–1.0)
Monocytes Relative: 16 %
Neutro Abs: 2 10*3/uL (ref 1.7–7.7)
Neutrophils Relative %: 53 %
Platelets: 144 10*3/uL — ABNORMAL LOW (ref 150–400)
RBC: 3.16 MIL/uL — ABNORMAL LOW (ref 4.22–5.81)
RDW: 17.4 % — ABNORMAL HIGH (ref 11.5–15.5)
WBC: 3.8 10*3/uL — ABNORMAL LOW (ref 4.0–10.5)
nRBC: 0.5 % — ABNORMAL HIGH (ref 0.0–0.2)

## 2020-06-12 LAB — COMPREHENSIVE METABOLIC PANEL
ALT: 75 U/L — ABNORMAL HIGH (ref 0–44)
AST: 116 U/L — ABNORMAL HIGH (ref 15–41)
Albumin: 4.2 g/dL (ref 3.5–5.0)
Alkaline Phosphatase: 90 U/L (ref 38–126)
Anion gap: 11 (ref 5–15)
BUN: 19 mg/dL (ref 8–23)
CO2: 22 mmol/L (ref 22–32)
Calcium: 8.5 mg/dL — ABNORMAL LOW (ref 8.9–10.3)
Chloride: 105 mmol/L (ref 98–111)
Creatinine, Ser: 1.28 mg/dL — ABNORMAL HIGH (ref 0.61–1.24)
GFR, Estimated: >60 mL/min (ref >60)
Glucose, Bld: 126 mg/dL — ABNORMAL HIGH (ref 70–99)
Potassium: 3.9 mmol/L (ref 3.5–5.1)
Sodium: 138 mmol/L (ref 135–145)
Total Bilirubin: 0.6 mg/dL (ref 0.3–1.2)
Total Protein: 8.3 g/dL — ABNORMAL HIGH (ref 6.5–8.1)

## 2020-06-12 LAB — IRON AND TIBC
Iron: 81 ug/dL (ref 45–182)
Saturation Ratios: 29 % (ref 17.9–39.5)
TIBC: 284 ug/dL (ref 250–450)
UIBC: 203 ug/dL

## 2020-06-12 LAB — FOLATE: Folate: 17.9 ng/mL (ref >5.9)

## 2020-06-12 LAB — FERRITIN: Ferritin: 1487 ng/mL — ABNORMAL HIGH (ref 24–336)

## 2020-06-12 MED ORDER — CYANOCOBALAMIN 1000 MCG/ML IJ SOLN
1000.0000 ug | Freq: Once | INTRAMUSCULAR | Status: AC
  Administered 2020-06-12: 1000 ug via INTRAMUSCULAR

## 2020-06-12 NOTE — Progress Notes (Signed)
[facility]  [address] [place], [place] [address] Phone: [phone]  Fax: [phone]   Clinic Day:  06/12/2020  Referring physician: [facility]  Chief Complaint: normocyticanemia and B12 deficiency   Hematology History: George Gomez is a 60 y.o. male with mild pancytopenia. CBCs from 07/31/2013 - 09/03/2017 reveal a hemoglobinfrom 10.1 - 14.7 without trend. MCV has been normal. Plateletshave ranged between 111,000 - 190,000. WBChas ranged between 2000 - 8300. WBC has been consistently low since 06/01/2017. He drinks alcoholdaily.  CBC on 08/08/2019revealed a hematocrit of 35.4, hemoglobin 11.5, MCV 94.5, platelets 149,000, WBC 2000 with an ANC of 1000. Differential included 49% segs, 34% lymphocytes, 14% monocytes, 2% eosinophils, and 1% basophils. Outside labsnote negative/normal peripheral smear, LDH, and SPEP/UPEP.  Work-up on 11/07/2019revealed a hematocrit of 29.6, hemoglobin 9.3, MCV 97.7, WBC 2600 with an ANC of 1200. Differential included 48% segs, 36% lymphs, 14% monocytes, 1% eosinophils, and 1% basophils. . B12was 332 (low normal). Normal studiesincluded: Creatinine (1.05), LFTs, ferritin (257), iron studies, folate (7.4), TSH, copper, zinc, ANA, hepatitis B core antibody, hepatitis B surface antigen, hepatitis C antibody, and HIV testing. Retic was 2.2%. Peripheral smearrevealed no abnormalities. Flow cytometry on 01/23/2019 was negative.  He has B12 deficiencyandfolate deficiency. He began B12 injections on 12/11/2017 (last  11/14/2019). Folatewas 4.1 on 07/13/2018 and 21.6 on 08/16/2019.  Abdomen and pelvic CTon 05/30/2017 revealed hepatic steatosis. Spleen was normal.  He has a history of a GI bleedon 05/30/2017. EGDon 06/01/2017 revealed a normal esophagus, normal stomach, and duodenitis. Biopsy revealed reactive duodenitis without dysplasia or malignancy. Colonoscopyon  10/16/2018 revealed diverticulosis in the entire examined colon. The distal rectum and anal verge were normal on retroflexion view. There was normal mucosa in the entire examined colon. Small bowel enteroscopyon 09/19/2020was entirely normal (esophagus, stomach, duodenum, proximal and mid-jejunum).Capsule endoscopyon 08/02/2019 revealed a 1 cm non-specific circumscribed lesion with superficial erosion in the distal small bowel, likely an ileum or distal jejunum.  MRI abdomen and pelvis enterography on 10/31/2019 was limited. There were no discrete bowel masses. There was no bowel wall thickening or other evidence of active bowel inflammation. There was no lymphadenopathy. There was marked diffuse colonic diverticulosis.  He has a history of elevated iron saturation. Ferritin was 1121 with an iron saturation of 50% on 03/21/2019.  Hemochromatosis assay on 03/31/2019 revealed no mutations (C282Y, H63D, S65C).   [facility].EGDon 07/02/2018 revealedalcoholic gastritis but no evidence of acute bleeding. Stools were heme positive. Herequireda PRBCtransfusion. Hemoglobinrangedbetween 8.4 - 8.7. He was discharged with PPI twice daily.  [facility] with nausea, vomiting and diarrhea.He had been drinking alcohol. Creatininewas 2.61 (baseline 0.83). He was dehydrated. He received IV fluids and electrolyte replacement. Creatinine was 1.77 on09/16/2020.Hemoglobin was 8.5.  [facility] with symptomatic anemia. He underwent small bowel enteroscopy and colonoscopy. He received 2 units of PRBCsand Venofer(400 mg).  He was seen in the [facility] ER on 04/11/2019 with alcohol intoxication.    HPI: Patient is 60 year old male with history of normocytic anemia and B12 deficiency secondary to history of alcoholism who presents for labs and reevaluation.  He  has continued B12 injections monthly.  Says he is drinking less, 2 drinks per day.  Feels like his energy level is somewhat improved since starting B12.  No shortness of breath or chest pain.  No leg pains.  Denies melena or hematochezia.  No  hemoptysis.  No dizziness or weakness.  No hematuria.  No nausea, vomiting, constipation, diarrhea.  No rash.  Appetite is stable and he endorses weight gain.  Past Medical History:  Diagnosis Date  . Acute renal failure (ARF) (HCC) 08/28/2016  . Acute renal failure (HCC) 10/12/2018  . Anemia   . B12 deficiency 12/11/2017  . Clostridium difficile diarrhea 03/18/2016  . Diabetes mellitus without complication (HCC)   . Diverticulitis    Pt states diverticulitis  . EtOH dependence (HCC)   . Folate deficiency 07/25/2018  . GI bleed 05/30/2017  . Hypercholesteremia   . Hypertension   . Melena 10/21/2018  . Weight loss 09/15/2018    Past Surgical History:  Procedure Laterality Date  . COLONOSCOPY WITH PROPOFOL N/A 10/16/2018   Procedure: COLONOSCOPY WITH PROPOFOL;  Surgeon: Toney Reil, MD;  Location: [facility] ENDOSCOPY;  Service: Gastroenterology;  Laterality: N/A;  . ENTEROSCOPY N/A 10/16/2018   Procedure: ENTEROSCOPY;  Surgeon: Toney Reil, MD;  Location: [facility] ENDOSCOPY;  Service: Gastroenterology;  Laterality: N/A;  . ESOPHAGOGASTRODUODENOSCOPY N/A 07/02/2018   Procedure: ESOPHAGOGASTRODUODENOSCOPY (EGD);  Surgeon: Toney Reil, MD;  Location: [facility] ENDOSCOPY;  Service: Gastroenterology;  Laterality: N/A;  . ESOPHAGOGASTRODUODENOSCOPY (EGD) WITH PROPOFOL N/A 06/01/2017   Procedure: ESOPHAGOGASTRODUODENOSCOPY (EGD) WITH PROPOFOL;  Surgeon: Wyline Mood, MD;  Location: [facility] ENDOSCOPY;  Service: Gastroenterology;  Laterality: N/A;  . GIVENS CAPSULE STUDY N/A 10/17/2018   Procedure: GIVENS CAPSULE STUDY;  Surgeon: Toney Reil, MD;  Location: [facility] ENDOSCOPY;  Service: Gastroenterology;  Laterality: N/A;  . GIVENS CAPSULE STUDY N/A 08/02/2019    Procedure: GIVENS CAPSULE STUDY;  Surgeon: Toney Reil, MD;  Location: [facility] ENDOSCOPY;  Service: Gastroenterology;  Laterality: N/A;  . none      Family History  Problem Relation Age of Onset  . Rheum arthritis Mother   . Diabetes Father   . Heart disease Father   . Cancer Father   . Cancer Maternal Grandfather   . Cancer Paternal Grandfather     Social History:  reports that he has never smoked. He has never used smokeless tobacco. He reports current alcohol use of about 6.0 standard drinks of alcohol per week. He reports that he does not use drugs. He drankliquor 2-3 x/day. He is drinking less than he did in the past (moonshine), about 2-3 shots daily. He did "crack" 10-15 years ago. He lives in [place]. Is no longer socially drinking, He says that if can overcome doing crack he can overcome drinking alcohol. 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 The patient is alone today.    Allergies: No Known Allergies  Current Medications: Current Outpatient Medications  Medication Sig Dispense Refill  . allopurinol (ZYLOPRIM) 300 MG tablet Take 300 mg by mouth daily.     Marland Kitchen amLODipine (NORVASC) 10 MG tablet Take 10 mg by mouth daily.    Marland Kitchen atorvastatin (LIPITOR) 40 MG tablet Take 40 mg by mouth daily.     . clobetasol cream (TEMOVATE) 0.05 % Apply topically.    . gabapentin (NEURONTIN) 300 MG capsule Take 300 mg by mouth 3 (three) times daily.   3  . loratadine (CLARITIN) 10 MG tablet Take 10 mg by mouth daily.    Marland Kitchen losartan (COZAAR) 100 MG tablet Take 100 mg by mouth daily.    . metFORMIN (GLUCOPHAGE-XR) 500 MG 24 hr tablet Take 1,000 mg by mouth daily.    . Multiple Vitamin (MULTIVITAMIN ADULT PO) Gnp Century Mature Multivitamin Tab 125c    .  Multiple Vitamin (MULTIVITAMIN ADULT PO) TAKE ONE TABLET BY MOUTH ONCE DAILY    . Multiple Vitamins-Minerals (CENTRUM SILVER) tablet Take 1 tablet by mouth daily.    . naltrexone (DEPADE) 50 MG tablet Take 50 mg by mouth  daily.     . pantoprazole (PROTONIX) 40 MG tablet Take 1 tablet (40 mg total) by mouth daily. 30 tablet 1  . propranolol ER (INDERAL LA) 60 MG 24 hr capsule Take 60 mg by mouth daily.     . TYLENOL 500 MG tablet Take 1,000 mg by mouth every 8 (eight) hours as needed.    . VOLTAREN 1 % GEL Apply 4 gram to affected area four times a day as needed for pain for knee pain. Do not exceed 16gm for LE joint per day     No current facility-administered medications for this visit.    Review of Systems  Constitutional: Positive for malaise/fatigue. Negative for chills, fever and weight loss.  HENT: Negative for hearing loss, nosebleeds, sore throat and tinnitus.   Eyes: Negative for blurred vision and double vision.  Respiratory: Negative for cough, hemoptysis, shortness of breath and wheezing.   Cardiovascular: Negative for chest pain, palpitations and leg swelling.  Gastrointestinal: Negative for abdominal pain, blood in stool, constipation, diarrhea, melena, nausea and vomiting.  Genitourinary: Negative for dysuria and urgency.  Musculoskeletal: Positive for joint pain (left knee). Negative for back pain, falls and myalgias.  Skin: Negative for itching and rash.  Neurological: Negative for dizziness, tingling, sensory change, loss of consciousness, weakness and headaches.  Endo/Heme/Allergies: Negative for environmental allergies. Does not bruise/bleed easily.  Psychiatric/Behavioral: Positive for substance abuse (improved). Negative for depression. The patient is not nervous/anxious and does not have insomnia.    Performance status (ECOG): 1  Vitals: Blood pressure 115/73, pulse 89, temperature 99.3 F (37.4 C), resp. rate 20, weight 231 lb 2.4 oz (104.9 kg), SpO2 100 %.  General: Well-developed, well-nourished, no acute distress. Eyes: Pink conjunctiva, anicteric sclera. Lungs: No audible wheezing or coughing Heart: Regular rate and rhythm.  Abdomen: Soft, nontender, nondistended.   Musculoskeletal: No edema, cyanosis, or clubbing. Neuro: Alert, answering all questions appropriately.  Skin: No rashes or petechiae noted. Psych: flat affect.   Appointment on 06/12/2020  Component Date Value Ref Range Status  . WBC 06/12/2020 3.8* 4.0 - 10.5 K/uL Final  . RBC 06/12/2020 3.16* 4.22 - 5.81 MIL/uL Final  . Hemoglobin 06/12/2020 9.2* 13.0 - 17.0 g/dL Final  . HCT 72/53/664405/17/2022 29.1* 39.0 - 52.0 % Final  . MCV 06/12/2020 92.1  80.0 - 100.0 fL Final  . MCH 06/12/2020 29.1  26.0 - 34.0 pg Final  . MCHC 06/12/2020 31.6  30.0 - 36.0 g/dL Final  . RDW 03/47/425905/17/2022 17.4* 11.5 - 15.5 % Final  . Platelets 06/12/2020 144* 150 - 400 K/uL Final  . nRBC 06/12/2020 0.5* 0.0 - 0.2 % Final  . Neutrophils Relative % 06/12/2020 53  % Final  . Neutro Abs 06/12/2020 2.0  1.7 - 7.7 K/uL Final  . Lymphocytes Relative 06/12/2020 26  % Final  . Lymphs Abs 06/12/2020 1.0  0.7 - 4.0 K/uL Final  . Monocytes Relative 06/12/2020 16  % Final  . Monocytes Absolute 06/12/2020 0.6  0.1 - 1.0 K/uL Final  . Eosinophils Relative 06/12/2020 3  % Final  . Eosinophils Absolute 06/12/2020 0.1  0.0 - 0.5 K/uL Final  . Basophils Relative 06/12/2020 1  % Final  . Basophils Absolute 06/12/2020 0.0  0.0 - 0.1 K/uL  Final  . Immature Granulocytes 06/12/2020 1  % Final  . Abs Immature Granulocytes 06/12/2020 0.04  0.00 - 0.07 K/uL Final   Performed at [facility], [address], [place], [place] [address]     Assessment:  1.Normocytic anemia - Hemoglobin 9.2. Decreased from previous visits but within baseline. Hematocrit 29.1. Ferritin 538 with an iron saturation of 25% and a TIBC of 335. Hx of Gi bleed secondary to alcoholic gastritis with associated melena. EGD & colonoscopy 09/2018 negative. Capsule 08/02/19 revealed non specific lesion. MRI negative on 10/31/19. Etiology unclear. Previously thought to be secondary to alcohol intake but as patient has decreased EtOH intake, hemoglobin hasn't  improved. No evidence of bleeding. Ferritin, iron studies, folate, b12, and cmp today. Results pending. Monitor.   2. B12 Deficiency - likely secondary to EtOH and malnutrition. Results pending today. Folate pending. Continue monthly b12 injections.   3. Upper GI Bleed- hx of gi bleed in 05/2017 and 06/2018. EGD and colonoscopy negative. Capsule on 08/02/19 revealed 1 cm non-specific circumscribed lesion with superficial erosion in the distal small bowel, likely an ileum or distal jejunum. MRI abdomen and pelvis enterography on 10/31/2019 revealed no evidence of bleeding. Hasn't been seen by GI since 2021. If counts down trending without apparent etiology, recommend he schedule f/u appt.   4. Elevated LFTs-hepatitis B and C serologies were - July 2021.  Etiology thought secondary to alcohol.  Patient's been decreasing alcohol intake and previously, LFTs have normalized.  We will check CMP today.  Plan:  B12 today and monthly x 3 RTC in 3 months for labs (cbc, ferritin, iron studies, afp, b12, folate), MD/NP, b12 injections  I discussed the assessment and treatment plan with the patient.  The patient was provided an opportunity to ask questions and all were answered.  The patient agreed with the plan and demonstrated an understanding of the instructions.  The patient was advised to call back or seek an in person evaluation if the symptoms worsen or if the condition fails to improve as anticipated.  Consuello Masse, DNP, AGNP-[facility] [phone] (clinic)

## 2020-06-13 LAB — VITAMIN B12: Vitamin B-12: 6869 pg/mL — ABNORMAL HIGH (ref 180–914)

## 2020-07-09 ENCOUNTER — Telehealth: Payer: Self-pay

## 2020-07-09 NOTE — Telephone Encounter (Signed)
Called pt to inform of elevated B12, Lauren will like to cancel June and July B12 inj and see Dr. Smith Robert in August as scheduled. Pt did not answer, left VM if any questions or concerns please return call.

## 2020-07-13 ENCOUNTER — Ambulatory Visit: Payer: Medicare Other

## 2020-07-18 ENCOUNTER — Inpatient Hospital Stay: Payer: Medicare Other | Attending: Oncology

## 2020-07-18 ENCOUNTER — Other Ambulatory Visit: Payer: Self-pay

## 2020-07-18 DIAGNOSIS — E538 Deficiency of other specified B group vitamins: Secondary | ICD-10-CM | POA: Insufficient documentation

## 2020-07-18 MED ORDER — CYANOCOBALAMIN 1000 MCG/ML IJ SOLN
1000.0000 ug | Freq: Once | INTRAMUSCULAR | Status: AC
  Administered 2020-07-18: 1000 ug via INTRAMUSCULAR

## 2020-07-19 ENCOUNTER — Encounter: Payer: Self-pay | Admitting: Nurse Practitioner

## 2020-07-19 ENCOUNTER — Ambulatory Visit (INDEPENDENT_AMBULATORY_CARE_PROVIDER_SITE_OTHER): Payer: Medicare Other | Admitting: Nurse Practitioner

## 2020-07-19 VITALS — BP 123/66 | HR 80 | Temp 98.5°F | Ht 69.0 in | Wt 230.2 lb

## 2020-07-19 DIAGNOSIS — E1140 Type 2 diabetes mellitus with diabetic neuropathy, unspecified: Secondary | ICD-10-CM | POA: Insufficient documentation

## 2020-07-19 DIAGNOSIS — E538 Deficiency of other specified B group vitamins: Secondary | ICD-10-CM | POA: Diagnosis not present

## 2020-07-19 DIAGNOSIS — I10 Essential (primary) hypertension: Secondary | ICD-10-CM | POA: Diagnosis not present

## 2020-07-19 DIAGNOSIS — R350 Frequency of micturition: Secondary | ICD-10-CM | POA: Diagnosis not present

## 2020-07-19 DIAGNOSIS — G629 Polyneuropathy, unspecified: Secondary | ICD-10-CM | POA: Diagnosis not present

## 2020-07-19 DIAGNOSIS — Z7689 Persons encountering health services in other specified circumstances: Secondary | ICD-10-CM | POA: Diagnosis not present

## 2020-07-19 DIAGNOSIS — D696 Thrombocytopenia, unspecified: Secondary | ICD-10-CM

## 2020-07-19 DIAGNOSIS — R7303 Prediabetes: Secondary | ICD-10-CM | POA: Insufficient documentation

## 2020-07-19 DIAGNOSIS — E1169 Type 2 diabetes mellitus with other specified complication: Secondary | ICD-10-CM | POA: Insufficient documentation

## 2020-07-19 DIAGNOSIS — E782 Mixed hyperlipidemia: Secondary | ICD-10-CM | POA: Diagnosis not present

## 2020-07-19 DIAGNOSIS — L309 Dermatitis, unspecified: Secondary | ICD-10-CM | POA: Insufficient documentation

## 2020-07-19 DIAGNOSIS — I152 Hypertension secondary to endocrine disorders: Secondary | ICD-10-CM | POA: Insufficient documentation

## 2020-07-19 DIAGNOSIS — M1A9XX0 Chronic gout, unspecified, without tophus (tophi): Secondary | ICD-10-CM | POA: Diagnosis not present

## 2020-07-19 DIAGNOSIS — K219 Gastro-esophageal reflux disease without esophagitis: Secondary | ICD-10-CM | POA: Diagnosis not present

## 2020-07-19 DIAGNOSIS — R7989 Other specified abnormal findings of blood chemistry: Secondary | ICD-10-CM | POA: Diagnosis not present

## 2020-07-19 LAB — MICROALBUMIN, URINE WAIVED
Creatinine, Urine Waived: 300 mg/dL (ref 10–300)
Microalb, Ur Waived: 80 mg/L — ABNORMAL HIGH (ref 0–19)

## 2020-07-19 LAB — BAYER DCA HB A1C WAIVED: HB A1C (BAYER DCA - WAIVED): 5.8 % (ref <7.0)

## 2020-07-19 NOTE — Assessment & Plan Note (Signed)
Chronic, stable. Controlled with gabapentin. Having numbness and tingling to left pinky finger. Neuropathy versus ulnar nerve compression. Can try compression with over the counter brace/strap below his elbow. Follow-up if symptoms worsen or don't improve.

## 2020-07-19 NOTE — Assessment & Plan Note (Addendum)
Chronic, stable. BP 123/66  Today. Continue current regimen. CMP and CBC reviewed from last month. check TSH today. Follow-up in 6 months

## 2020-07-19 NOTE — Assessment & Plan Note (Signed)
Chronic, stable. Has history of GI bleed. Continue protonix. Check magnesium today. Follow-up in 6 months

## 2020-07-19 NOTE — Assessment & Plan Note (Signed)
Chronic, well controlled with allopurinol. Check uric acid level today. Follow-up in 6 months.

## 2020-07-19 NOTE — Assessment & Plan Note (Signed)
Chronic, stable. Check A1C. He is currently taking metformin 500mg  daily. Will treat based on lab results. Check urine microalbumin.

## 2020-07-19 NOTE — Assessment & Plan Note (Signed)
Being followed by hematology. Currently getting vitamin B12 injections. Continue collaboration and recommendations by hematology.

## 2020-07-19 NOTE — Progress Notes (Signed)
New Patient Office Visit  Subjective:  Patient ID: George Gomez, male    DOB: [birth year]  Age: 60 y.o. MRN: [id number]  CC:  Chief Complaint  Patient presents with   Establish Care   Skin Problem    Pt states he has a rash type spot on his ankles and feet. States they do itch and have been there for a couple of months      HPI [name] presents for new patient visit to establish care.  Introduced to Publishing rights manager role and practice setting.  All questions answered.  Discussed provider/patient relationship and expectations.  HYPERTENSION / HYPERLIPIDEMIA  Satisfied with current treatment? yes Duration of hypertension: chronic BP monitoring frequency: not checking BP range: n/a BP medication side effects: no Past BP meds: amlodipine and losartan (cozaar) Duration of hyperlipidemia: chronic Cholesterol medication side effects: no Cholesterol supplements: none Past cholesterol medications: atorvastain (lipitor) Medication compliance: excellent compliance Aspirin: no Recent stressors: no Recurrent headaches: no Visual changes: no Palpitations: no Dyspnea:  with exertion, stable Chest pain: no Lower extremity edema:  intermittent Dizzy/lightheaded: no   GOUT  He has a history of gout flares and has been taking allopurinol which is working well. He has not had a gout flare in several years.   PREDIABETES WITH NEUROPATHY  He is taking metformin 500mg  daily. He is not checking his blood sugar at home. He denies polyuria and polydipsia. He is taking metformin three times a day to help with neuropathy in his feet.    Depression screen [facility] 2/9 07/19/2020  Decreased Interest 0  Down, Depressed, Hopeless 0  PHQ - 2 Score 0     Past Medical History:  Diagnosis Date   Acute on chronic renal failure (HCC) 03/20/2016   Acute renal failure (ARF) (HCC) 08/28/2016   Acute renal failure (HCC) 10/12/2018   Anemia    B12 deficiency 12/11/2017   Clostridium difficile  diarrhea 03/18/2016   Diabetes mellitus without complication (HCC)    Diverticulitis    Pt states diverticulitis   EtOH dependence (HCC)    Folate deficiency 07/25/2018   GI bleed 05/30/2017   Hypercholesteremia    Hypertension    Melena 10/21/2018   Weight loss 09/15/2018    Past Surgical History:  Procedure Laterality Date   COLONOSCOPY WITH PROPOFOL N/A 10/16/2018   Procedure: COLONOSCOPY WITH PROPOFOL;  Surgeon: 10/18/2018, MD;  Location: [facility] ENDOSCOPY;  Service: Gastroenterology;  Laterality: N/A;   ENTEROSCOPY N/A 10/16/2018   Procedure: ENTEROSCOPY;  Surgeon: 10/18/2018, MD;  Location: [facility] ENDOSCOPY;  Service: Gastroenterology;  Laterality: N/A;   ESOPHAGOGASTRODUODENOSCOPY N/A 07/02/2018   Procedure: ESOPHAGOGASTRODUODENOSCOPY (EGD);  Surgeon: 09/01/2018, MD;  Location: [facility] ENDOSCOPY;  Service: Gastroenterology;  Laterality: N/A;   ESOPHAGOGASTRODUODENOSCOPY (EGD) WITH PROPOFOL N/A 06/01/2017   Procedure: ESOPHAGOGASTRODUODENOSCOPY (EGD) WITH PROPOFOL;  Surgeon: 08/01/2017, MD;  Location: [facility] ENDOSCOPY;  Service: Gastroenterology;  Laterality: N/A;   GIVENS CAPSULE STUDY N/A 10/17/2018   Procedure: GIVENS CAPSULE STUDY;  Surgeon: 10/19/2018, MD;  Location: [facility] ENDOSCOPY;  Service: Gastroenterology;  Laterality: N/A;   GIVENS CAPSULE STUDY N/A 08/02/2019   Procedure: GIVENS CAPSULE STUDY;  Surgeon: 10/03/2019, MD;  Location: [facility] ENDOSCOPY;  Service: Gastroenterology;  Laterality: N/A;   none      Family History  Problem Relation Age of Onset   Rheum arthritis Mother    Diabetes Father    Heart disease Father    Cancer Father  COPD Sister    Rheum arthritis Sister    Diabetes Maternal Grandmother    Cancer Maternal Grandfather    Diabetes Paternal Grandmother    Cancer Paternal Grandfather     Social History   Socioeconomic History   Marital status: Divorced    Spouse name: Not on file   Number of children: Not on file    Years of education: Not on file   Highest education level: Not on file  Occupational History   Occupation: on disability  Tobacco Use   Smoking status: Never   Smokeless tobacco: Never  Vaping Use   Vaping Use: Never used  Substance and Sexual Activity   Alcohol use: Not Currently    Comment: on occasion   Drug use: No   Sexual activity: Yes  Other Topics Concern   Not on file  Social History Narrative   Not on file   Social Determinants of Health   Financial Resource Strain: Not on file  Food Insecurity: Not on file  Transportation Needs: Not on file  Physical Activity: Not on file  Stress: Not on file  Social Connections: Not on file  Intimate Partner Violence: Not on file    ROS Review of Systems  Constitutional: Negative.   HENT: Negative.    Eyes: Negative.   Respiratory:  Positive for shortness of breath (with exertion, chronic). Negative for cough.   Cardiovascular: Negative.   Gastrointestinal: Negative.   Endocrine: Negative.   Genitourinary: Negative.   Musculoskeletal:  Positive for arthralgias (left knee, chronic, stable).  Skin:  Positive for rash (eczema to right ankle).  Allergic/Immunologic: Positive for environmental allergies.  Neurological:  Positive for numbness (left pinky, intermittent).  Hematological: Negative.   Psychiatric/Behavioral: Negative.     Objective:   Today's Vitals: BP 123/66 (BP Location: Right Arm, Cuff Size: Normal)   Pulse 80   Temp 98.5 F (36.9 C) (Oral)   Ht 5\' 9"  (1.753 m)   Wt 230 lb 3.2 oz (104.4 kg)   SpO2 91%   BMI 33.99 kg/m   Physical Exam Vitals and nursing note reviewed.  Constitutional:      Appearance: Normal appearance.  HENT:     Head: Normocephalic.     Mouth/Throat:     Mouth: Mucous membranes are moist.  Eyes:     Conjunctiva/sclera: Conjunctivae normal.  Cardiovascular:     Rate and Rhythm: Normal rate and regular rhythm.     Pulses: Normal pulses.     Heart sounds: Normal heart  sounds.  Pulmonary:     Effort: Pulmonary effort is normal.     Breath sounds: Normal breath sounds.  Abdominal:     Palpations: Abdomen is soft.     Tenderness: There is no abdominal tenderness.  Musculoskeletal:        General: Normal range of motion.     Cervical back: Normal range of motion.     Right lower leg: No edema.     Left lower leg: No edema.  Skin:    General: Skin is warm.     Findings: Rash (dry, scaly patch to right ankle) present.  Neurological:     General: No focal deficit present.     Mental Status: He is alert and oriented to person, place, and time.  Psychiatric:        Mood and Affect: Mood normal.        Behavior: Behavior normal.        Thought Content: Thought content  normal.        Judgment: Judgment normal.    Assessment & Plan:   Problem List Items Addressed This Visit       Cardiovascular and Mediastinum   Primary hypertension    Chronic, stable. BP 123/66  Today. Continue current regimen. CMP and CBC reviewed from last month. check TSH today. Follow-up in 6 months       Relevant Orders   Microalbumin, Urine Waived   TSH     Digestive   Gastroesophageal reflux disease    Chronic, stable. Has history of GI bleed. Continue protonix. Check magnesium today. Follow-up in 6 months       Relevant Orders   Magnesium     Nervous and Auditory   Neuropathy    Chronic, stable. Controlled with gabapentin. Having numbness and tingling to left pinky finger. Neuropathy versus ulnar nerve compression. Can try compression with over the counter brace/strap below his elbow. Follow-up if symptoms worsen or don't improve.          Musculoskeletal and Integument   Eczema    Scaly, dry patch noted to his right ankle. He can continue using triamcinolone cream daily to this area. Follow-up if not improving.          Other   Thrombocytopenia (HCC)    Following with hematology. Continue collaboration and recommendations from them.        B12  deficiency    Being followed by hematology. Currently getting vitamin B12 injections. Continue collaboration and recommendations by hematology.       Elevated LFTs    Has a history of alcohol abuse. He has significantly cut back on alcohol intake, now drinking only a couple times a week. His hepatitis work-up was negative. Hematology has been checking CMP and has labs scheduled for next visit with them. Will continue watching AST/ALT.        Prediabetes - Primary    Chronic, stable. Check A1C. He is currently taking metformin 500mg  daily. Will treat based on lab results. Check urine microalbumin.        Relevant Orders   Bayer DCA Hb A1c Waived   Microalbumin, Urine Waived   Mixed hyperlipidemia    Chronic, stable. Check lipid panel today. Continue current regimen. Follow up in 6 months.        Relevant Orders   Lipid Panel w/o Chol/HDL Ratio   Chronic gout without tophus    Chronic, well controlled with allopurinol. Check uric acid level today. Follow-up in 6 months.        Relevant Orders   Uric acid   Other Visit Diagnoses     Urinary frequency       Check PSA today   Relevant Orders   PSA   Encounter to establish care           Outpatient Encounter Medications as of 07/19/2020  Medication Sig   allopurinol (ZYLOPRIM) 300 MG tablet Take 300 mg by mouth daily.    amLODipine (NORVASC) 10 MG tablet Take 10 mg by mouth daily.   atorvastatin (LIPITOR) 40 MG tablet Take 40 mg by mouth daily.    gabapentin (NEURONTIN) 300 MG capsule Take 300 mg by mouth 3 (three) times daily.    loratadine (CLARITIN) 10 MG tablet Take 10 mg by mouth daily.   losartan (COZAAR) 100 MG tablet Take 100 mg by mouth daily.   metFORMIN (GLUCOPHAGE-XR) 500 MG 24 hr tablet Take 500 mg by mouth daily.   Multiple  Vitamin (MULTIVITAMIN ADULT PO) Gnp Century Mature Multivitamin Tab 125c   propranolol ER (INDERAL LA) 60 MG 24 hr capsule Take 60 mg by mouth daily.    TYLENOL 500 MG tablet Take  1,000 mg by mouth every 8 (eight) hours as needed.   VOLTAREN 1 % GEL Apply 4 gram to affected area four times a day as needed for pain for knee pain. Do not exceed 16gm for LE joint per day   pantoprazole (PROTONIX) 40 MG tablet Take 1 tablet (40 mg total) by mouth daily.   [DISCONTINUED] clobetasol cream (TEMOVATE) 0.05 % Apply topically.   [DISCONTINUED] Multiple Vitamin (MULTIVITAMIN ADULT PO) TAKE ONE TABLET BY MOUTH ONCE DAILY   [DISCONTINUED] Multiple Vitamins-Minerals (CENTRUM SILVER) tablet Take 1 tablet by mouth daily.   [DISCONTINUED] naltrexone (DEPADE) 50 MG tablet Take 50 mg by mouth daily.    No facility-administered encounter medications on file as of 07/19/2020.    Follow-up: Return in about 6 months (around 01/18/2021) for htn, hld, prediabetes.   Gerre Scull, NP

## 2020-07-19 NOTE — Assessment & Plan Note (Signed)
Has a history of alcohol abuse. He has significantly cut back on alcohol intake, now drinking only a couple times a week. His hepatitis work-up was negative. Hematology has been checking CMP and has labs scheduled for next visit with them. Will continue watching AST/ALT.

## 2020-07-19 NOTE — Assessment & Plan Note (Signed)
Scaly, dry patch noted to his right ankle. He can continue using triamcinolone cream daily to this area. Follow-up if not improving.

## 2020-07-19 NOTE — Assessment & Plan Note (Signed)
Chronic, stable. Check lipid panel today. Continue current regimen. Follow up in 6 months.

## 2020-07-19 NOTE — Assessment & Plan Note (Signed)
Following with hematology. Continue collaboration and recommendations from them.

## 2020-07-20 LAB — LIPID PANEL W/O CHOL/HDL RATIO
Cholesterol, Total: 149 mg/dL (ref 100–199)
HDL: 60 mg/dL (ref >39)
LDL Chol Calc (NIH): 64 mg/dL (ref 0–99)
Triglycerides: 150 mg/dL — ABNORMAL HIGH (ref 0–149)
VLDL Cholesterol Cal: 25 mg/dL (ref 5–40)

## 2020-07-20 LAB — TSH: TSH: 2.84 u[IU]/mL (ref 0.450–4.500)

## 2020-07-20 LAB — URIC ACID: Uric Acid: 6 mg/dL (ref 3.8–8.4)

## 2020-07-20 LAB — PSA: Prostate Specific Ag, Serum: 0.9 ng/mL (ref 0.0–4.0)

## 2020-07-20 LAB — MAGNESIUM: Magnesium: 1.1 mg/dL — ABNORMAL LOW (ref 1.6–2.3)

## 2020-08-09 DIAGNOSIS — I10 Essential (primary) hypertension: Secondary | ICD-10-CM | POA: Diagnosis not present

## 2020-08-09 DIAGNOSIS — E113293 Type 2 diabetes mellitus with mild nonproliferative diabetic retinopathy without macular edema, bilateral: Secondary | ICD-10-CM | POA: Diagnosis not present

## 2020-08-10 ENCOUNTER — Ambulatory Visit: Payer: Medicare Other

## 2020-08-15 ENCOUNTER — Ambulatory Visit: Payer: Medicare Other

## 2020-08-15 ENCOUNTER — Telehealth: Payer: Self-pay | Admitting: Oncology

## 2020-08-16 ENCOUNTER — Inpatient Hospital Stay: Payer: Medicare Other | Attending: Oncology

## 2020-08-16 ENCOUNTER — Other Ambulatory Visit: Payer: Self-pay

## 2020-08-16 DIAGNOSIS — E538 Deficiency of other specified B group vitamins: Secondary | ICD-10-CM | POA: Insufficient documentation

## 2020-08-16 MED ORDER — CYANOCOBALAMIN 1000 MCG/ML IJ SOLN
1000.0000 ug | Freq: Once | INTRAMUSCULAR | Status: AC
  Administered 2020-08-16: 1000 ug via INTRAMUSCULAR

## 2020-09-12 ENCOUNTER — Inpatient Hospital Stay (HOSPITAL_BASED_OUTPATIENT_CLINIC_OR_DEPARTMENT_OTHER): Payer: Medicare Other | Admitting: Oncology

## 2020-09-12 ENCOUNTER — Encounter: Payer: Self-pay | Admitting: Oncology

## 2020-09-12 ENCOUNTER — Other Ambulatory Visit: Payer: Self-pay

## 2020-09-12 ENCOUNTER — Inpatient Hospital Stay: Payer: Medicare Other

## 2020-09-12 ENCOUNTER — Inpatient Hospital Stay: Payer: Medicare Other | Attending: Oncology

## 2020-09-12 VITALS — BP 112/67 | HR 109 | Temp 98.6°F | Resp 16 | Ht 69.0 in | Wt 231.0 lb

## 2020-09-12 DIAGNOSIS — D649 Anemia, unspecified: Secondary | ICD-10-CM | POA: Diagnosis not present

## 2020-09-12 DIAGNOSIS — E538 Deficiency of other specified B group vitamins: Secondary | ICD-10-CM

## 2020-09-12 DIAGNOSIS — R7989 Other specified abnormal findings of blood chemistry: Secondary | ICD-10-CM

## 2020-09-12 LAB — CBC WITH DIFFERENTIAL/PLATELET
Abs Immature Granulocytes: 0.04 10*3/uL (ref 0.00–0.07)
Basophils Absolute: 0 10*3/uL (ref 0.0–0.1)
Basophils Relative: 1 %
Eosinophils Absolute: 0.1 10*3/uL (ref 0.0–0.5)
Eosinophils Relative: 4 %
HCT: 31.7 % — ABNORMAL LOW (ref 39.0–52.0)
Hemoglobin: 10.3 g/dL — ABNORMAL LOW (ref 13.0–17.0)
Immature Granulocytes: 1 %
Lymphocytes Relative: 27 %
Lymphs Abs: 1 10*3/uL (ref 0.7–4.0)
MCH: 29.9 pg (ref 26.0–34.0)
MCHC: 32.5 g/dL (ref 30.0–36.0)
MCV: 92.2 fL (ref 80.0–100.0)
Monocytes Absolute: 0.4 10*3/uL (ref 0.1–1.0)
Monocytes Relative: 10 %
Neutro Abs: 2.2 10*3/uL (ref 1.7–7.7)
Neutrophils Relative %: 57 %
Platelets: 178 10*3/uL (ref 150–400)
RBC: 3.44 MIL/uL — ABNORMAL LOW (ref 4.22–5.81)
RDW: 16.1 % — ABNORMAL HIGH (ref 11.5–15.5)
WBC: 3.7 10*3/uL — ABNORMAL LOW (ref 4.0–10.5)
nRBC: 0.5 % — ABNORMAL HIGH (ref 0.0–0.2)

## 2020-09-12 LAB — IRON AND TIBC
Iron: 93 ug/dL (ref 45–182)
Saturation Ratios: 32 % (ref 17.9–39.5)
TIBC: 293 ug/dL (ref 250–450)
UIBC: 200 ug/dL

## 2020-09-12 LAB — COMPREHENSIVE METABOLIC PANEL
ALT: 62 U/L — ABNORMAL HIGH (ref 0–44)
AST: 70 U/L — ABNORMAL HIGH (ref 15–41)
Albumin: 3.7 g/dL (ref 3.5–5.0)
Alkaline Phosphatase: 98 U/L (ref 38–126)
Anion gap: 10 (ref 5–15)
BUN: 18 mg/dL (ref 8–23)
CO2: 24 mmol/L (ref 22–32)
Calcium: 9 mg/dL (ref 8.9–10.3)
Chloride: 101 mmol/L (ref 98–111)
Creatinine, Ser: 1.19 mg/dL (ref 0.61–1.24)
GFR, Estimated: >60 mL/min (ref >60)
Glucose, Bld: 174 mg/dL — ABNORMAL HIGH (ref 70–99)
Potassium: 3.5 mmol/L (ref 3.5–5.1)
Sodium: 135 mmol/L (ref 135–145)
Total Bilirubin: 0.6 mg/dL (ref 0.3–1.2)
Total Protein: 7.9 g/dL (ref 6.5–8.1)

## 2020-09-12 LAB — FERRITIN: Ferritin: 832 ng/mL — ABNORMAL HIGH (ref 24–336)

## 2020-09-12 MED ORDER — CYANOCOBALAMIN 1000 MCG/ML IJ SOLN
1000.0000 ug | Freq: Once | INTRAMUSCULAR | Status: AC
  Administered 2020-09-12: 1000 ug via INTRAMUSCULAR

## 2020-09-12 NOTE — Progress Notes (Signed)
Hematology/Oncology Consult note [facility]  Telephone:([phone] [phone]  Patient Care Team: Charyl Dancer, NP as PCP - General (Internal Medicine) Lin Landsman, MD as Consulting Physician (Gastroenterology) Lequita Asal, MD (Inactive) as Referring Physician (Hematology and Oncology)   Name of the patient: George Gomez  [id number]  Apr 24, 1960   Date of visit: 09/12/20  Diagnosis-normocytic anemia of unclear etiology  Chief complaint/ Reason for visit-routine follow-up of normocytic anemia  Heme/Onc history: Patient is a 60 year old African-American male who is transferring his care from Dr. Mike Gip to me.He has been followed for his pancytopenia but mainly anemia.  Hemoglobin has remained around 10 for the last 3 years.  He also has mild intermittent leukopenia with a white count that fluctuates between 3.3-4 mainly neutropenia.  Platelet counts have been mostly normal except for intermittent mild low platelet counts between 120s to 140s.  He has prior history of B12 deficiency and he gets monthly B12 injections.  History of folate deficiency but of late that has been normal.  He had a flow cytometry in December 2020 that was normal.  Anemia work-up including TSH copper zinc ANA hepatitis testing and HIV testing was negative.Patient has had an elevated ferritin which has been again a longstanding issue and his ferritin levels fluctuate between [phone].  He had HFE gene testing done in the past which was negative.LFTs have shown mildly elevated AST and ALT intermittently but no hyperbilirubinemia.MR eneterogram in October 2021 showed normal liver size and configuration.  No splenomegaly.  Interval history-patient reports that he occasionally gets coughing spells and that translates into hiccups.  He reports ongoing tingling numbness in his hands and feet.  He does have baseline diabetes  ECOG PS- 1 Pain scale- 0   Review of  systems- Review of Systems  Constitutional:  Positive for malaise/fatigue. Negative for chills, fever and weight loss.  HENT:  Negative for congestion, ear discharge and nosebleeds.   Eyes:  Negative for blurred vision.  Respiratory:  Negative for cough, hemoptysis, sputum production, shortness of breath and wheezing.        Hiccups  Cardiovascular:  Negative for chest pain, palpitations, orthopnea and claudication.  Gastrointestinal:  Negative for abdominal pain, blood in stool, constipation, diarrhea, heartburn, melena, nausea and vomiting.  Genitourinary:  Negative for dysuria, flank pain, frequency, hematuria and urgency.  Musculoskeletal:  Negative for back pain, joint pain and myalgias.  Skin:  Negative for rash.  Neurological:  Positive for sensory change (Peripheral neuropathy). Negative for dizziness, tingling, focal weakness, seizures, weakness and headaches.  Endo/Heme/Allergies:  Does not bruise/bleed easily.  Psychiatric/Behavioral:  Negative for depression and suicidal ideas. The patient does not have insomnia.       No Known Allergies   Past Medical History:  Diagnosis Date   Acute on chronic renal failure ([place]) 03/20/2016   Acute renal failure (ARF) ([place]) 08/28/2016   Acute renal failure ([place]) 10/12/2018   Anemia    B12 deficiency 12/11/2017   Clostridium difficile diarrhea 03/18/2016   Diabetes mellitus without complication ([place])    Diverticulitis    Pt states diverticulitis   EtOH dependence ([place])    Folate deficiency 07/25/2018   GI bleed 05/30/2017   Hypercholesteremia    Hypertension    Melena 10/21/2018   Weight loss 09/15/2018     Past Surgical History:  Procedure Laterality Date   COLONOSCOPY WITH PROPOFOL N/A 10/16/2018   Procedure: COLONOSCOPY WITH PROPOFOL;  Surgeon: Lin Landsman, MD;  Location: [facility] ENDOSCOPY;  Service: Gastroenterology;  Laterality: N/A;   ENTEROSCOPY N/A 10/16/2018   Procedure: ENTEROSCOPY;  Surgeon: Lin Landsman, MD;   Location: [facility] ENDOSCOPY;  Service: Gastroenterology;  Laterality: N/A;   ESOPHAGOGASTRODUODENOSCOPY N/A 07/02/2018   Procedure: ESOPHAGOGASTRODUODENOSCOPY (EGD);  Surgeon: Lin Landsman, MD;  Location: [facility] ENDOSCOPY;  Service: Gastroenterology;  Laterality: N/A;   ESOPHAGOGASTRODUODENOSCOPY (EGD) WITH PROPOFOL N/A 06/01/2017   Procedure: ESOPHAGOGASTRODUODENOSCOPY (EGD) WITH PROPOFOL;  Surgeon: Jonathon Bellows, MD;  Location: [facility] ENDOSCOPY;  Service: Gastroenterology;  Laterality: N/A;   GIVENS CAPSULE STUDY N/A 10/17/2018   Procedure: GIVENS CAPSULE STUDY;  Surgeon: Lin Landsman, MD;  Location: [facility] ENDOSCOPY;  Service: Gastroenterology;  Laterality: N/A;   GIVENS CAPSULE STUDY N/A 08/02/2019   Procedure: GIVENS CAPSULE STUDY;  Surgeon: Lin Landsman, MD;  Location: [facility] ENDOSCOPY;  Service: Gastroenterology;  Laterality: N/A;   none      Social History   Socioeconomic History   Marital status: Divorced    Spouse name: Not on file   Number of children: Not on file   Years of education: Not on file   Highest education level: Not on file  Occupational History   Occupation: on disability  Tobacco Use   Smoking status: Never   Smokeless tobacco: Never  Vaping Use   Vaping Use: Never used  Substance and Sexual Activity   Alcohol use: Yes    Comment: on occasion   Drug use: No   Sexual activity: Yes  Other Topics Concern   Not on file  Social History Narrative   Not on file   Social Determinants of Health   Financial Resource Strain: Not on file  Food Insecurity: Not on file  Transportation Needs: Not on file  Physical Activity: Not on file  Stress: Not on file  Social Connections: Not on file  Intimate Partner Violence: Not on file    Family History  Problem Relation Age of Onset   Rheum arthritis Mother    Diabetes Father    Heart disease Father    Cancer Father    COPD Sister    Rheum arthritis Sister    Diabetes Maternal Grandmother    Cancer  Maternal Grandfather    Diabetes Paternal Grandmother    Cancer Paternal Grandfather      Current Outpatient Medications:    allopurinol (ZYLOPRIM) 300 MG tablet, Take 300 mg by mouth daily. , Disp: , Rfl:    amLODipine (NORVASC) 10 MG tablet, Take 10 mg by mouth daily., Disp: , Rfl:    atorvastatin (LIPITOR) 40 MG tablet, Take 40 mg by mouth daily. , Disp: , Rfl:    gabapentin (NEURONTIN) 300 MG capsule, Take 300 mg by mouth 3 (three) times daily. , Disp: , Rfl: 3   loratadine (CLARITIN) 10 MG tablet, Take 10 mg by mouth daily., Disp: , Rfl:    losartan (COZAAR) 100 MG tablet, Take 100 mg by mouth daily., Disp: , Rfl:    metFORMIN (GLUCOPHAGE-XR) 500 MG 24 hr tablet, Take 500 mg by mouth daily., Disp: , Rfl:    Multiple Vitamin (MULTIVITAMIN ADULT PO), Gnp Century Mature Multivitamin Tab 125c, Disp: , Rfl:    pantoprazole (PROTONIX) 40 MG tablet, Take 1 tablet (40 mg total) by mouth daily., Disp: 30 tablet, Rfl: 1   propranolol ER (INDERAL LA) 60 MG 24 hr capsule, Take 60 mg by mouth daily. , Disp: , Rfl:    TYLENOL 500 MG tablet, Take 1,000 mg by mouth every  8 (eight) hours as needed., Disp: , Rfl:    VOLTAREN 1 % GEL, Apply 4 gram to affected area four times a day as needed for pain for knee pain. Do not exceed 16gm for LE joint per day, Disp: , Rfl:   Physical exam:  Vitals:   09/12/20 1113 09/12/20 1118  BP:  112/67  Pulse:  (!) 109  Resp:  16  Temp:  98.6 F (37 C)  TempSrc:  Oral  Weight: 231 lb 0.7 oz (104.8 kg) 231 lb 0.7 oz (104.8 kg)  Height:  _0  (1.753 m)   Physical Exam Constitutional:      General: He is not in acute distress. Cardiovascular:     Rate and Rhythm: Normal rate and regular rhythm.     Heart sounds: Normal heart sounds.  Pulmonary:     Effort: Pulmonary effort is normal.     Breath sounds: Normal breath sounds.  Abdominal:     General: Bowel sounds are normal.     Palpations: Abdomen is soft.  Skin:    General: Skin is warm and dry.   Neurological:     Mental Status: He is alert and oriented to person, place, and time.     CMP Latest Ref Rng & Units 09/12/2020  Glucose 70 - 99 mg/dL 174(H)  BUN 8 - 23 mg/dL 18  Creatinine 0.61 - 1.24 mg/dL 1.19  Sodium 135 - 145 mmol/L 135  Potassium 3.5 - 5.1 mmol/L 3.5  Chloride 98 - 111 mmol/L 101  CO2 22 - 32 mmol/L 24  Calcium 8.9 - 10.3 mg/dL 9.0  Total Protein 6.5 - 8.1 g/dL 7.9  Total Bilirubin 0.3 - 1.2 mg/dL 0.6  Alkaline Phos 38 - 126 U/L 98  AST 15 - 41 U/L 70(H)  ALT 0 - 44 U/L 62(H)   CBC Latest Ref Rng & Units 09/12/2020  WBC 4.0 - 10.5 K/uL 3.7(L)  Hemoglobin 13.0 - 17.0 g/dL 10.3(L)  Hematocrit 39.0 - 52.0 % 31.7(L)  Platelets 150 - 400 K/uL 178    Assessment and plan- Patient is a 60 y.o. male who is here for following issues:  History of B12 deficiency: Continue monthly B12 injections Normocytic anemia: Hemoglobin is remained stable around 10 for the last 3 years.  Anemia work-up in the past was unremarkable.  Suspect anemia of chronic disease due to underlying comorbidities.  If there is continued worsening in his anemia we will consider getting a bone marrow biopsy at that time Elevated ferritin: I suspect this is secondary to ongoing alcohol use although patient states that he drinks alcohol occasionally.CT in the past has shown fatty liver and he has had persistently elevated AST and ALT as well.  Suspect elevated ferritin is reactive Peripheral neuropathy: Suspect secondary to diabetes.  Patient is interested in neurology evaluation we will refer him for the same.  CBC and ferritin in 3 in 6 months and I will see him in 6 months.  B12 folate and reticulocyte count also to be checked in 3 months.   Visit Diagnosis 1. Normocytic anemia   2. B12 deficiency      Dr. Randa Evens, MD, MPH [facility] at [facility] [phone] 09/12/2020 3:42 PM

## 2020-09-12 NOTE — Progress Notes (Signed)
Pt has no concerns except for he has coughing spell and then turn into hiccups. His PCP says that he is drinking too fast. The pt says he can wake up from sleep and have coughing and has hiccups

## 2020-09-13 LAB — AFP TUMOR MARKER: AFP, Serum, Tumor Marker: 4 ng/mL (ref 0.0–8.4)

## 2020-09-28 ENCOUNTER — Telehealth: Payer: Self-pay

## 2020-09-28 ENCOUNTER — Ambulatory Visit: Payer: Medicare Other

## 2020-09-28 NOTE — Telephone Encounter (Signed)
This nurse attempted to call patient three times in regards to our scheduled telephonic AWV. Called at 1425, 1430, and 1440. Message left to call back to reschedule or we will call him.

## 2020-10-17 ENCOUNTER — Ambulatory Visit (INDEPENDENT_AMBULATORY_CARE_PROVIDER_SITE_OTHER): Payer: Medicare Other

## 2020-10-17 ENCOUNTER — Telehealth: Payer: Self-pay

## 2020-10-17 ENCOUNTER — Inpatient Hospital Stay: Payer: Medicare Other | Attending: Nurse Practitioner

## 2020-10-17 DIAGNOSIS — Z0000 Encounter for general adult medical examination without abnormal findings: Secondary | ICD-10-CM

## 2020-10-17 DIAGNOSIS — E538 Deficiency of other specified B group vitamins: Secondary | ICD-10-CM | POA: Insufficient documentation

## 2020-10-17 NOTE — Patient Instructions (Addendum)
Health Maintenance, Male Adopting a healthy lifestyle and getting preventive care are important in promoting health and wellness. Ask your health care provider about: The right schedule for you to have regular tests and exams. Things you can do on your own to prevent diseases and keep yourself healthy. What should I know about diet, weight, and exercise? Eat a healthy diet  Eat a diet that includes plenty of vegetables, fruits, low-fat dairy products, and lean protein. Do not eat a lot of foods that are high in solid fats, added sugars, or sodium. Maintain a healthy weight Body mass index (BMI) is a measurement that can be used to identify possible weight problems. It estimates body fat based on height and weight. Your health care provider can help determine your BMI and help you achieve or maintain a healthy weight. Get regular exercise Get regular exercise. This is one of the most important things you can do for your health. Most adults should: Exercise for at least 150 minutes each week. The exercise should increase your heart rate and make you sweat (moderate-intensity exercise). Do strengthening exercises at least twice a week. This is in addition to the moderate-intensity exercise. Spend less time sitting. Even light physical activity can be beneficial. Watch cholesterol and blood lipids Have your blood tested for lipids and cholesterol at 62 years of age, then have this test every 5 years. You may need to have your cholesterol levels checked more often if: Your lipid or cholesterol levels are high. You are older than 62 years of age. You are at high risk for heart disease. What should I know about cancer screening? Many types of cancers can be detected early and may often be prevented. Depending on your health history and family history, you may need to have cancer screening at various ages. This may include screening for: Colorectal cancer. Prostate cancer. Skin cancer. Lung  cancer. What should I know about heart disease, diabetes, and high blood pressure? Blood pressure and heart disease High blood pressure causes heart disease and increases the risk of stroke. This is more likely to develop in people who have high blood pressure readings, are of African descent, or are overweight. Talk with your health care provider about your target blood pressure readings. Have your blood pressure checked: Every 3-5 years if you are 18-39 years of age. Every year if you are 40 years old or older. If you are between the ages of 65 and 75 and are a current or former smoker, ask your health care provider if you should have a one-time screening for abdominal aortic aneurysm (AAA). Diabetes Have regular diabetes screenings. This checks your fasting blood sugar level. Have the screening done: Once every three years after age 45 if you are at a normal weight and have a low risk for diabetes. More often and at a younger age if you are overweight or have a high risk for diabetes. What should I know about preventing infection? Hepatitis B If you have a higher risk for hepatitis B, you should be screened for this virus. Talk with your health care provider to find out if you are at risk for hepatitis B infection. Hepatitis C Blood testing is recommended for: Everyone born from 1945 through 1965. Anyone with known risk factors for hepatitis C. Sexually transmitted infections (STIs) You should be screened each year for STIs, including gonorrhea and chlamydia, if: You are sexually active and are younger than 62 years of age. You are older than 62 years   of age and your health care provider tells you that you are at risk for this type of infection. Your sexual activity has changed since you were last screened, and you are at increased risk for chlamydia or gonorrhea. Ask your health care provider if you are at risk. Ask your health care provider about whether you are at high risk for HIV.  Your health care provider may recommend a prescription medicine to help prevent HIV infection. If you choose to take medicine to prevent HIV, you should first get tested for HIV. You should then be tested every 3 months for as long as you are taking the medicine. Follow these instructions at home: Lifestyle Do not use any products that contain nicotine or tobacco, such as cigarettes, e-cigarettes, and chewing tobacco. If you need help quitting, ask your health care provider. Do not use street drugs. Do not share needles. Ask your health care provider for help if you need support or information about quitting drugs. Alcohol use Do not drink alcohol if your health care provider tells you not to drink. If you drink alcohol: Limit how much you have to 0-2 drinks a day. Be aware of how much alcohol is in your drink. In the [place] one drink equals one 12 oz bottle of beer (355 mL), one 5 oz glass of wine (148 mL), or one 1 oz glass of hard liquor (44 mL). General instructions Schedule regular health, dental, and eye exams. Stay current with your vaccines. Tell your health care provider if: You often feel depressed. You have ever been abused or do not feel safe at home. Summary Adopting a healthy lifestyle and getting preventive care are important in promoting health and wellness. Follow your health care provider's instructions about healthy diet, exercising, and getting tested or screened for diseases. Follow your health care provider's instructions on monitoring your cholesterol and blood pressure. This information is not intended to replace advice given to you by your health care provider. Make sure you discuss any questions you have with your health care provider. Document Revised: 03/23/2020 Document Reviewed: 01/06/2018 [employer] Patient Education  2022 [employer]  Health Maintenance, Male Adopting a healthy lifestyle and getting preventive care are important in promoting health and  wellness. Ask your health care provider about: The right schedule for you to have regular tests and exams. Things you can do on your own to prevent diseases and keep yourself healthy. What should I know about diet, weight, and exercise? Eat a healthy diet  Eat a diet that includes plenty of vegetables, fruits, low-fat dairy products, and lean protein. Do not eat a lot of foods that are high in solid fats, added sugars, or sodium. Maintain a healthy weight Body mass index (BMI) is a measurement that can be used to identify possible weight problems. It estimates body fat based on height and weight. Your health care provider can help determine your BMI and help you achieve or maintain a healthy weight. Get regular exercise Get regular exercise. This is one of the most important things you can do for your health. Most adults should: Exercise for at least 150 minutes each week. The exercise should increase your heart rate and make you sweat (moderate-intensity exercise). Do strengthening exercises at least twice a week. This is in addition to the moderate-intensity exercise. Spend less time sitting. Even light physical activity can be beneficial. Watch cholesterol and blood lipids Have your blood tested for lipids and cholesterol at 60 years of age, then have   this test every 5 years. You may need to have your cholesterol levels checked more often if: Your lipid or cholesterol levels are high. You are older than 62 years of age. You are at high risk for heart disease. What should I know about cancer screening? Many types of cancers can be detected early and may often be prevented. Depending on your health history and family history, you may need to have cancer screening at various ages. This may include screening for: Colorectal cancer. Prostate cancer. Skin cancer. Lung cancer. What should I know about heart disease, diabetes, and high blood pressure? Blood pressure and heart disease High  blood pressure causes heart disease and increases the risk of stroke. This is more likely to develop in people who have high blood pressure readings, are of African descent, or are overweight. Talk with your health care provider about your target blood pressure readings. Have your blood pressure checked: Every 3-5 years if you are 18-39 years of age. Every year if you are 40 years old or older. If you are between the ages of 65 and 75 and are a current or former smoker, ask your health care provider if you should have a one-time screening for abdominal aortic aneurysm (AAA). Diabetes Have regular diabetes screenings. This checks your fasting blood sugar level. Have the screening done: Once every three years after age 45 if you are at a normal weight and have a low risk for diabetes. More often and at a younger age if you are overweight or have a high risk for diabetes. What should I know about preventing infection? Hepatitis B If you have a higher risk for hepatitis B, you should be screened for this virus. Talk with your health care provider to find out if you are at risk for hepatitis B infection. Hepatitis C Blood testing is recommended for: Everyone born from 1945 through 1965. Anyone with known risk factors for hepatitis C. Sexually transmitted infections (STIs) You should be screened each year for STIs, including gonorrhea and chlamydia, if: You are sexually active and are younger than 62 years of age. You are older than 62 years of age and your health care provider tells you that you are at risk for this type of infection. Your sexual activity has changed since you were last screened, and you are at increased risk for chlamydia or gonorrhea. Ask your health care provider if you are at risk. Ask your health care provider about whether you are at high risk for HIV. Your health care provider may recommend a prescription medicine to help prevent HIV infection. If you choose to take medicine  to prevent HIV, you should first get tested for HIV. You should then be tested every 3 months for as long as you are taking the medicine. Follow these instructions at home: Lifestyle Do not use any products that contain nicotine or tobacco, such as cigarettes, e-cigarettes, and chewing tobacco. If you need help quitting, ask your health care provider. Do not use street drugs. Do not share needles. Ask your health care provider for help if you need support or information about quitting drugs. Alcohol use Do not drink alcohol if your health care provider tells you not to drink. If you drink alcohol: Limit how much you have to 0-2 drinks a day. Be aware of how much alcohol is in your drink. In the [place] one drink equals one 12 oz bottle of beer (355 mL), one 5 oz glass of wine (148 mL), or one   1 oz glass of hard liquor (44 mL). General instructions Schedule regular health, dental, and eye exams. Stay current with your vaccines. Tell your health care provider if: You often feel depressed. You have ever been abused or do not feel safe at home. Summary Adopting a healthy lifestyle and getting preventive care are important in promoting health and wellness. Follow your health care provider's instructions about healthy diet, exercising, and getting tested or screened for diseases. Follow your health care provider's instructions on monitoring your cholesterol and blood pressure. This information is not intended to replace advice given to you by your health care provider. Make sure you discuss any questions you have with your health care provider. Document Revised: 03/23/2020 Document Reviewed: 01/06/2018 Else[employer] Patient Education  2022 [employer]  

## 2020-10-17 NOTE — Telephone Encounter (Signed)
Attempted to reach patient for visit and I had to leave him a VM requesting he call the office to get back on the schedule

## 2020-10-24 ENCOUNTER — Other Ambulatory Visit: Payer: Self-pay

## 2020-10-24 ENCOUNTER — Inpatient Hospital Stay: Payer: Medicare Other

## 2020-10-24 DIAGNOSIS — E538 Deficiency of other specified B group vitamins: Secondary | ICD-10-CM | POA: Diagnosis not present

## 2020-10-24 MED ORDER — CYANOCOBALAMIN 1000 MCG/ML IJ SOLN
1000.0000 ug | Freq: Once | INTRAMUSCULAR | Status: AC
  Administered 2020-10-24: 1000 ug via INTRAMUSCULAR

## 2020-11-07 ENCOUNTER — Other Ambulatory Visit: Payer: Self-pay

## 2020-11-07 NOTE — Telephone Encounter (Signed)
Requesting refill of Gabapentin 300 mg caps TID  Last seen 07/19/20  Up coming appt 01/14/21

## 2020-11-08 MED ORDER — GABAPENTIN 300 MG PO CAPS: 300.0000 mg | ORAL_CAPSULE | Freq: Three times a day (TID) | ORAL | 1 refills | Status: DC

## 2020-11-14 ENCOUNTER — Inpatient Hospital Stay: Payer: Medicare Other | Attending: Oncology

## 2020-12-12 ENCOUNTER — Other Ambulatory Visit: Payer: Medicare Other

## 2020-12-12 ENCOUNTER — Inpatient Hospital Stay: Payer: Medicare Other | Attending: Oncology

## 2021-01-01 ENCOUNTER — Telehealth: Payer: Self-pay | Admitting: Oncology

## 2021-01-01 NOTE — Telephone Encounter (Signed)
Pt called to set up appt for labs and injection, please call back at [phone]

## 2021-01-11 NOTE — Progress Notes (Signed)
Established Patient Office Visit  Subjective:  Patient ID: George Gomez, male    DOB: 1960-07-09  Age: 60 y.o. MRN: [id number]  CC:  Chief Complaint  Patient presents with   Annual Exam   Prediabetes   Hypertension    HPI George Gomez presents for wellness check and hypertension follow up. He started noticing a rash on both of his feet for the last few months.  RASH  Duration:  months  Location: feet  Itching: yes Burning: yes Redness: yes Oozing: no Scaling: no Blisters: no Painful: yes Fevers: no Change in detergents/soaps/personal care products: no Recent illness: no Recent travel:no History of same: no Context: worse Alleviating factors: hydrocortisone cream Treatments attempted:hydrocortisone cream Shortness of breath: no  Throat/tongue swelling: no Myalgias/arthralgias: no  HYPERTENSION / HYPERLIPIDEMIA  Satisfied with current treatment? yes Duration of hypertension: chronic BP monitoring frequency: a few times a week BP range: 130s/70s BP medication side effects: no Past BP meds: amlodipine, losartan Duration of hyperlipidemia: chronic Cholesterol medication side effects: no Cholesterol supplements: none Past cholesterol medications: atorvastatin Medication compliance: excellent compliance Aspirin: no Recent stressors: no Recurrent headaches: no Visual changes: no Palpitations: no Dyspnea: no Chest pain: no Lower extremity edema: no Dizzy/lightheaded: no  Past Medical History:  Diagnosis Date   Acute on chronic renal failure (HCC) 03/20/2016   Acute renal failure (ARF) (HCC) 08/28/2016   Acute renal failure (HCC) 10/12/2018   Anemia    B12 deficiency 12/11/2017   Clostridium difficile diarrhea 03/18/2016   Diabetes mellitus without complication (HCC)    Diverticulitis    Pt states diverticulitis   EtOH dependence (HCC)    Folate deficiency 07/25/2018   GI bleed 05/30/2017   Hypercholesteremia    Hypertension    Melena 10/21/2018    Weight loss 09/15/2018    Past Surgical History:  Procedure Laterality Date   COLONOSCOPY WITH PROPOFOL N/A 10/16/2018   Procedure: COLONOSCOPY WITH PROPOFOL;  Surgeon: Toney Reil, MD;  Location: [facility] ENDOSCOPY;  Service: Gastroenterology;  Laterality: N/A;   ENTEROSCOPY N/A 10/16/2018   Procedure: ENTEROSCOPY;  Surgeon: Toney Reil, MD;  Location: [facility] ENDOSCOPY;  Service: Gastroenterology;  Laterality: N/A;   ESOPHAGOGASTRODUODENOSCOPY N/A 07/02/2018   Procedure: ESOPHAGOGASTRODUODENOSCOPY (EGD);  Surgeon: Toney Reil, MD;  Location: [facility] ENDOSCOPY;  Service: Gastroenterology;  Laterality: N/A;   ESOPHAGOGASTRODUODENOSCOPY (EGD) WITH PROPOFOL N/A 06/01/2017   Procedure: ESOPHAGOGASTRODUODENOSCOPY (EGD) WITH PROPOFOL;  Surgeon: Wyline Mood, MD;  Location: [facility] ENDOSCOPY;  Service: Gastroenterology;  Laterality: N/A;   GIVENS CAPSULE STUDY N/A 10/17/2018   Procedure: GIVENS CAPSULE STUDY;  Surgeon: Toney Reil, MD;  Location: [facility] ENDOSCOPY;  Service: Gastroenterology;  Laterality: N/A;   GIVENS CAPSULE STUDY N/A 08/02/2019   Procedure: GIVENS CAPSULE STUDY;  Surgeon: Toney Reil, MD;  Location: [facility] ENDOSCOPY;  Service: Gastroenterology;  Laterality: N/A;   none      Family History  Problem Relation Age of Onset   Rheum arthritis Mother    Diabetes Father    Heart disease Father    Cancer Father    COPD Sister    Rheum arthritis Sister    Diabetes Maternal Grandmother    Cancer Maternal Grandfather    Diabetes Paternal Grandmother    Cancer Paternal Grandfather     Social History   Socioeconomic History   Marital status: Divorced    Spouse name: Not on file   Number of children: Not on file   Years of education: Not on file   Highest  education level: Not on file  Occupational History   Occupation: on disability  Tobacco Use   Smoking status: Never   Smokeless tobacco: Never  Vaping Use   Vaping Use: Never used  Substance and Sexual  Activity   Alcohol use: Yes    Comment: on occasion   Drug use: No   Sexual activity: Yes  Other Topics Concern   Not on file  Social History Narrative   Not on file   Social Determinants of Health   Financial Resource Strain: Not on file  Food Insecurity: Not on file  Transportation Needs: Not on file  Physical Activity: Not on file  Stress: Not on file  Social Connections: Not on file  Intimate Partner Violence: Not on file    Outpatient Medications Prior to Visit  Medication Sig Dispense Refill   allopurinol (ZYLOPRIM) 300 MG tablet Take 300 mg by mouth daily.      amLODipine (NORVASC) 10 MG tablet Take 10 mg by mouth daily.     atorvastatin (LIPITOR) 40 MG tablet Take 40 mg by mouth daily.      gabapentin (NEURONTIN) 300 MG capsule Take 1 capsule (300 mg total) by mouth 3 (three) times daily. 270 capsule 1   loratadine (CLARITIN) 10 MG tablet Take 10 mg by mouth daily.     losartan (COZAAR) 100 MG tablet Take 100 mg by mouth daily.     metFORMIN (GLUCOPHAGE-XR) 500 MG 24 hr tablet Take 500 mg by mouth daily.     Multiple Vitamin (MULTIVITAMIN ADULT PO) Gnp Century Mature Multivitamin Tab 125c     propranolol ER (INDERAL LA) 60 MG 24 hr capsule Take 60 mg by mouth daily.      TYLENOL 500 MG tablet Take 1,000 mg by mouth every 8 (eight) hours as needed.     VOLTAREN 1 % GEL Apply 4 gram to affected area four times a day as needed for pain for knee pain. Do not exceed 16gm for LE joint per day     pantoprazole (PROTONIX) 40 MG tablet Take 1 tablet (40 mg total) by mouth daily. 30 tablet 1   No facility-administered medications prior to visit.    No Known Allergies  ROS Review of Systems  Constitutional:  Positive for fatigue.  HENT: Negative.    Eyes: Negative.   Respiratory: Negative.    Cardiovascular: Negative.   Gastrointestinal: Negative.   Genitourinary: Negative.   Musculoskeletal:  Positive for arthralgias (chronic).  Skin:  Positive for rash.   Neurological:  Positive for numbness (and tingling in his feet).  Psychiatric/Behavioral: Negative.       Objective:    Physical Exam Vitals and nursing note reviewed.  Constitutional:      Appearance: Normal appearance. He is obese.  HENT:     Head: Normocephalic and atraumatic.     Right Ear: Tympanic membrane, ear canal and external ear normal.     Left Ear: Tympanic membrane, ear canal and external ear normal.     Nose: Nose normal.     Mouth/Throat:     Mouth: Mucous membranes are moist.  Eyes:     Pupils: Pupils are equal, round, and reactive to light.  Cardiovascular:     Rate and Rhythm: Normal rate and regular rhythm.     Pulses: Normal pulses.     Heart sounds: Normal heart sounds.  Pulmonary:     Effort: Pulmonary effort is normal.     Breath sounds: Normal breath sounds.  Abdominal:     Palpations: Abdomen is soft.  Musculoskeletal:        General: Normal range of motion.     Cervical back: Normal range of motion and neck supple. No tenderness.     Comments: Strength 5/5 in bilateral upper and lower extremities  Lymphadenopathy:     Cervical: No cervical adenopathy.  Skin:    General: Skin is warm and dry.     Findings: Rash (circular patches of dry, scaly skin to bilateral feet) present.  Neurological:     General: No focal deficit present.     Mental Status: He is alert and oriented to person, place, and time.  Psychiatric:        Mood and Affect: Mood normal.        Behavior: Behavior normal.        Thought Content: Thought content normal.        Judgment: Judgment normal.    BP 138/82 (BP Location: Right Arm, Patient Position: Sitting)    Pulse (!) 104    Temp 98.3 F (36.8 C) (Oral)    Resp 20    Ht 5\' 11"  (1.803 m)    Wt 229 lb 6.4 oz (104.1 kg)    SpO2 96%    BMI 31.99 kg/m  Wt Readings from Last 3 Encounters:  01/14/21 229 lb 6.4 oz (104.1 kg)  09/12/20 231 lb 0.7 oz (104.8 kg)  07/19/20 230 lb 3.2 oz (104.4 kg)     Health Maintenance Due   Topic Date Due   COVID-19 Vaccine (1) Never done   Pneumococcal Vaccine 28-16 Years old (1 - PCV) Never done    There are no preventive care reminders to display for this patient.  Lab Results  Component Value Date   TSH 2.840 07/19/2020   Lab Results  Component Value Date   WBC 3.7 (L) 09/12/2020   HGB 10.3 (L) 09/12/2020   HCT 31.7 (L) 09/12/2020   MCV 92.2 09/12/2020   PLT 178 09/12/2020   Lab Results  Component Value Date   NA 135 09/12/2020   K 3.5 09/12/2020   CO2 24 09/12/2020   GLUCOSE 174 (H) 09/12/2020   BUN 18 09/12/2020   CREATININE 1.19 09/12/2020   BILITOT 0.6 09/12/2020   ALKPHOS 98 09/12/2020   AST 70 (H) 09/12/2020   ALT 62 (H) 09/12/2020   PROT 7.9 09/12/2020   ALBUMIN 3.7 09/12/2020   CALCIUM 9.0 09/12/2020   ANIONGAP 10 09/12/2020   Lab Results  Component Value Date   CHOL 149 07/19/2020   Lab Results  Component Value Date   HDL 60 07/19/2020   Lab Results  Component Value Date   LDLCALC 64 07/19/2020   Lab Results  Component Value Date   TRIG 150 (H) 07/19/2020   No results found for: CHOLHDL Lab Results  Component Value Date   HGBA1C 5.8 07/19/2020      Assessment & Plan:   Problem List Items Addressed This Visit       Cardiovascular and Mediastinum   Primary hypertension - Primary    Chronic, stable. Continue current regimen. Check CMP and CBC. No refills needed. Follow up in 6 months.       Relevant Orders   CBC with Differential/Platelet   Comprehensive metabolic panel     Digestive   Gastroesophageal reflux disease    Chronic, stable. Continue current regimen. Check magnesium level today.         Nervous and Auditory  Neuropathy    Chronic, ongoing. He was only taking his gabapentin twice a day and states he still has some burning in his feet. Educated him that he can take gabapentin 3 times a day to help control his symptoms.         Musculoskeletal and Integument   Eczema    Ongoing to bilateral  feet. Will send in triamcinolone cream to use twice a day. Follow up if symptoms not improving.        Hematopoietic and Hemostatic   Thrombocytopenia (HCC)    Check CMP and CBC today.         Other   B12 deficiency    Following with hematology and getting vitamin B12  Infusions. Continue collaboration and recommendations from hematology.       Prediabetes    Check A1C today and treat based on results. Continue being mindful of the amount of carbs and sugars he is eating.       Relevant Orders   Bayer DCA Hb A1c Waived   Mixed hyperlipidemia    Chronic, ongoing. Check lipid panel today and adjust regimen based on results.       Relevant Orders   Lipid Panel w/o Chol/HDL Ratio   Chronic gout without tophus    Chronic, stable. Continue allopurinol. Follow up in 6 months.       Obesity (BMI 30-39.9)    BMI 31.9 today. Discussed diet and exercise. Goal is to lose 1-2 pounds per week.       Other Visit Diagnoses     Hypomagnesemia       Magnesium low on prior labs, will check magnesium today and treat based on results.    Relevant Orders   Magnesium   Routine general medical examination at a health care facility       Health maintenance reviewed and updated. Flu vaccine given today.    Influenza vaccine needed       Flu vaccine given today   Relevant Orders   Flu Vaccine QUAD 6+ mos PF IM (Fluarix Quad PF) (Completed)      IMMUNIZATIONS:   - Tdap: Tetanus vaccination status reviewed: last tetanus booster within 10 years. - Influenza: Administered today - Pneumovax: Not applicable - Prevnar: Not applicable - HPV: Not applicable - Zostavax vaccine: Refused  SCREENING: - Colonoscopy: Up to date  Discussed with patient purpose of the colonoscopy is to detect colon cancer at curable precancerous or early stages   - AAA Screening: Not applicable  -Hearing Test: Not applicable  -Spirometry: Not applicable   PATIENT COUNSELING:    Sexuality: Discussed sexually  transmitted diseases, partner selection, use of condoms, avoidance of unintended pregnancy  and contraceptive alternatives.   Advised to avoid cigarette smoking.  I discussed with the patient that most people either abstain from alcohol or drink within safe limits (<=14/week and <=4 drinks/occasion for males, <=7/weeks and <= 3 drinks/occasion for females) and that the risk for alcohol disorders and other health effects rises proportionally with the number of drinks per week and how often a drinker exceeds daily limits.  Discussed cessation/primary prevention of drug use and availability of treatment for abuse.   Diet: Encouraged to adjust caloric intake to maintain  or achieve ideal body weight, to reduce intake of dietary saturated fat and total fat, to limit sodium intake by avoiding high sodium foods and not adding table salt, and to maintain adequate dietary potassium and calcium preferably from fresh fruits, vegetables, and low-fat  dairy products.    stressed the importance of regular exercise  Injury prevention: Discussed safety belts, safety helmets, smoke detector, smoking near bedding or upholstery.   Dental health: Discussed importance of regular tooth brushing, flossing, and dental visits.   Meds ordered this encounter  Medications   triamcinolone cream (KENALOG) 0.1 %    Sig: Apply 1 application topically 2 (two) times daily.    Dispense:  453.6 g    Refill:  0    Follow-up: No follow-ups on file.    Gerre Scull, NP

## 2021-01-14 ENCOUNTER — Encounter: Payer: Self-pay | Admitting: Nurse Practitioner

## 2021-01-14 ENCOUNTER — Other Ambulatory Visit: Payer: Self-pay

## 2021-01-14 ENCOUNTER — Ambulatory Visit (INDEPENDENT_AMBULATORY_CARE_PROVIDER_SITE_OTHER): Payer: Medicare Other | Admitting: Nurse Practitioner

## 2021-01-14 VITALS — BP 138/82 | HR 104 | Temp 98.3°F | Resp 20 | Ht 71.0 in | Wt 229.4 lb

## 2021-01-14 DIAGNOSIS — R7303 Prediabetes: Secondary | ICD-10-CM

## 2021-01-14 DIAGNOSIS — E669 Obesity, unspecified: Secondary | ICD-10-CM

## 2021-01-14 DIAGNOSIS — M1A9XX0 Chronic gout, unspecified, without tophus (tophi): Secondary | ICD-10-CM

## 2021-01-14 DIAGNOSIS — K219 Gastro-esophageal reflux disease without esophagitis: Secondary | ICD-10-CM

## 2021-01-14 DIAGNOSIS — Z23 Encounter for immunization: Secondary | ICD-10-CM

## 2021-01-14 DIAGNOSIS — E782 Mixed hyperlipidemia: Secondary | ICD-10-CM

## 2021-01-14 DIAGNOSIS — G629 Polyneuropathy, unspecified: Secondary | ICD-10-CM

## 2021-01-14 DIAGNOSIS — E538 Deficiency of other specified B group vitamins: Secondary | ICD-10-CM | POA: Diagnosis not present

## 2021-01-14 DIAGNOSIS — I10 Essential (primary) hypertension: Secondary | ICD-10-CM | POA: Diagnosis not present

## 2021-01-14 DIAGNOSIS — Z0000 Encounter for general adult medical examination without abnormal findings: Secondary | ICD-10-CM | POA: Diagnosis not present

## 2021-01-14 DIAGNOSIS — D696 Thrombocytopenia, unspecified: Secondary | ICD-10-CM

## 2021-01-14 DIAGNOSIS — L309 Dermatitis, unspecified: Secondary | ICD-10-CM

## 2021-01-14 LAB — BAYER DCA HB A1C WAIVED: HB A1C (BAYER DCA - WAIVED): 6.2 % — ABNORMAL HIGH (ref 4.8–5.6)

## 2021-01-14 MED ORDER — TRIAMCINOLONE ACETONIDE 0.1 % EX CREA: 1.0000 "application " | TOPICAL_CREAM | Freq: Two times a day (BID) | CUTANEOUS | 0 refills | Status: DC

## 2021-01-14 NOTE — Assessment & Plan Note (Signed)
Chronic, stable. Continue current regimen. Check magnesium level today.

## 2021-01-14 NOTE — Assessment & Plan Note (Signed)
Check A1C today and treat based on results. Continue being mindful of the amount of carbs and sugars he is eating.

## 2021-01-14 NOTE — Patient Instructions (Signed)
Make sure you are taking your gabapentin 3 times a day

## 2021-01-14 NOTE — Assessment & Plan Note (Signed)
Check CMP and CBC today. 

## 2021-01-14 NOTE — Assessment & Plan Note (Signed)
BMI 31.9 today. Discussed diet and exercise. Goal is to lose 1-2 pounds per week.

## 2021-01-14 NOTE — Assessment & Plan Note (Signed)
Chronic, ongoing. He was only taking his gabapentin twice a day and states he still has some burning in his feet. Educated him that he can take gabapentin 3 times a day to help control his symptoms.

## 2021-01-14 NOTE — Assessment & Plan Note (Addendum)
Chronic, stable. Continue current regimen. Check CMP and CBC. No refills needed. Follow up in 6 months.

## 2021-01-14 NOTE — Assessment & Plan Note (Signed)
Chronic, stable. Continue allopurinol. Follow up in 6 months.

## 2021-01-14 NOTE — Assessment & Plan Note (Signed)
Ongoing to bilateral feet. Will send in triamcinolone cream to use twice a day. Follow up if symptoms not improving.

## 2021-01-14 NOTE — Assessment & Plan Note (Signed)
Chronic, ongoing. Check lipid panel today and adjust regimen based on results.

## 2021-01-14 NOTE — Assessment & Plan Note (Signed)
Following with hematology and getting vitamin B12  Infusions. Continue collaboration and recommendations from hematology.

## 2021-01-15 LAB — CBC WITH DIFFERENTIAL/PLATELET
Basophils Absolute: 0 10*3/uL (ref 0.0–0.2)
Basos: 1 %
EOS (ABSOLUTE): 0.1 10*3/uL (ref 0.0–0.4)
Eos: 3 %
Hematocrit: 31.6 % — ABNORMAL LOW (ref 37.5–51.0)
Hemoglobin: 10.7 g/dL — ABNORMAL LOW (ref 13.0–17.7)
Immature Grans (Abs): 0 10*3/uL (ref 0.0–0.1)
Immature Granulocytes: 0 %
Lymphocytes Absolute: 1.1 10*3/uL (ref 0.7–3.1)
Lymphs: 25 %
MCH: 29.8 pg (ref 26.6–33.0)
MCHC: 33.9 g/dL (ref 31.5–35.7)
MCV: 88 fL (ref 79–97)
Monocytes Absolute: 0.6 10*3/uL (ref 0.1–0.9)
Monocytes: 13 %
Neutrophils Absolute: 2.6 10*3/uL (ref 1.4–7.0)
Neutrophils: 58 %
Platelets: 183 10*3/uL (ref 150–450)
RBC: 3.59 x10E6/uL — ABNORMAL LOW (ref 4.14–5.80)
RDW: 14.3 % (ref 11.6–15.4)
WBC: 4.5 10*3/uL (ref 3.4–10.8)

## 2021-01-15 LAB — COMPREHENSIVE METABOLIC PANEL
ALT: 58 IU/L — ABNORMAL HIGH (ref 0–44)
AST: 59 IU/L — ABNORMAL HIGH (ref 0–40)
Albumin/Globulin Ratio: 1.3 (ref 1.2–2.2)
Albumin: 4.5 g/dL (ref 3.8–4.8)
Alkaline Phosphatase: 130 IU/L — ABNORMAL HIGH (ref 44–121)
BUN/Creatinine Ratio: 18 (ref 10–24)
BUN: 21 mg/dL (ref 8–27)
Bilirubin Total: 0.4 mg/dL (ref 0.0–1.2)
CO2: 22 mmol/L (ref 20–29)
Calcium: 9.9 mg/dL (ref 8.6–10.2)
Chloride: 104 mmol/L (ref 96–106)
Creatinine, Ser: 1.15 mg/dL (ref 0.76–1.27)
Globulin, Total: 3.6 g/dL (ref 1.5–4.5)
Glucose: 118 mg/dL — ABNORMAL HIGH (ref 70–99)
Potassium: 4.2 mmol/L (ref 3.5–5.2)
Sodium: 139 mmol/L (ref 134–144)
Total Protein: 8.1 g/dL (ref 6.0–8.5)
eGFR: 72 mL/min/{1.73_m2} (ref >59)

## 2021-01-15 LAB — LIPID PANEL W/O CHOL/HDL RATIO
Cholesterol, Total: 156 mg/dL (ref 100–199)
HDL: 66 mg/dL (ref >39)
LDL Chol Calc (NIH): 70 mg/dL (ref 0–99)
Triglycerides: 115 mg/dL (ref 0–149)
VLDL Cholesterol Cal: 20 mg/dL (ref 5–40)

## 2021-01-15 LAB — MAGNESIUM: Magnesium: 1.2 mg/dL — ABNORMAL LOW (ref 1.6–2.3)

## 2021-01-15 MED ORDER — MAGNESIUM OXIDE -MG SUPPLEMENT 200 MG PO TABS: 200.0000 mg | ORAL_TABLET | Freq: Two times a day (BID) | ORAL | 1 refills | Status: DC

## 2021-01-15 NOTE — Addendum Note (Signed)
Addended by: Rodman Pickle A on: 01/15/2021 08:08 AM   Modules accepted: Orders

## 2021-01-16 ENCOUNTER — Inpatient Hospital Stay: Payer: Medicare Other | Attending: Oncology

## 2021-01-16 ENCOUNTER — Inpatient Hospital Stay: Payer: Medicare Other

## 2021-01-24 ENCOUNTER — Other Ambulatory Visit: Payer: Self-pay | Admitting: Nurse Practitioner

## 2021-01-24 NOTE — Telephone Encounter (Signed)
°  Requested medication (s) are on the active medication list yes  Future visit scheduled: 02/15/21  Notes to clinic:  Historical Provider, please assess.  Requested Prescriptions  Pending Prescriptions Disp Refills   atorvastatin (LIPITOR) 40 MG tablet      Sig: Take 1 tablet (40 mg total) by mouth daily.     Cardiovascular:  Antilipid - Statins Passed - 01/24/2021  4:23 PM      Passed - Total Cholesterol in normal range and within 360 days    Cholesterol, Total  Date Value Ref Range Status  01/14/2021 156 100 - 199 mg/dL Final          Passed - LDL in normal range and within 360 days    LDL Chol Calc (NIH)  Date Value Ref Range Status  01/14/2021 70 0 - 99 mg/dL Final          Passed - HDL in normal range and within 360 days    HDL  Date Value Ref Range Status  01/14/2021 66 >39 mg/dL Final          Passed - Triglycerides in normal range and within 360 days    Triglycerides  Date Value Ref Range Status  01/14/2021 115 0 - 149 mg/dL Final          Passed - Patient is not pregnant      Passed - Valid encounter within last 12 months    Recent Outpatient Visits           1 week ago Primary hypertension   [facility] McElwee, Lauren A, NP   6 months ago Prediabetes   [facility] McElwee, Jake Church, NP

## 2021-01-24 NOTE — Telephone Encounter (Signed)
Medication Refill - Medication: atorvastatin (LIPITOR) 40 MG tablet  Has the patient contacted their pharmacy? Yes.   (He said pharmacy told him to call his dr.  Preferred Pharmacy (with phone number or street name): [facility] - [place], [place] - [address] Has the patient been seen for an appointment in the last year OR does the patient have an upcoming appointment? Yes.    Agent: Please be advised that RX refills may take up to 3 business days. We ask that you follow-up with your pharmacy.

## 2021-01-25 MED ORDER — ATORVASTATIN CALCIUM 40 MG PO TABS: 40.0000 mg | ORAL_TABLET | Freq: Every day | ORAL | 1 refills | Status: DC

## 2021-01-31 ENCOUNTER — Encounter: Payer: Self-pay | Admitting: Hematology and Oncology

## 2021-02-07 ENCOUNTER — Other Ambulatory Visit: Payer: Self-pay

## 2021-02-07 MED ORDER — LOSARTAN POTASSIUM 100 MG PO TABS
100.0000 mg | ORAL_TABLET | Freq: Every day | ORAL | 1 refills | Status: DC
Start: 2021-02-07 — End: 2021-02-28

## 2021-02-13 ENCOUNTER — Inpatient Hospital Stay: Payer: Medicare Other

## 2021-02-13 ENCOUNTER — Inpatient Hospital Stay: Payer: Medicare Other | Attending: Oncology

## 2021-02-15 ENCOUNTER — Other Ambulatory Visit: Payer: Medicare Other

## 2021-02-26 ENCOUNTER — Other Ambulatory Visit: Payer: Self-pay

## 2021-02-26 MED ORDER — PROPRANOLOL HCL ER 60 MG PO CP24: 60.0000 mg | ORAL_CAPSULE | Freq: Every day | ORAL | 0 refills | Status: DC

## 2021-02-28 ENCOUNTER — Other Ambulatory Visit: Payer: Self-pay

## 2021-02-28 MED ORDER — LOSARTAN POTASSIUM 100 MG PO TABS: 100.0000 mg | ORAL_TABLET | Freq: Every day | ORAL | 1 refills | Status: DC

## 2021-02-28 NOTE — Telephone Encounter (Signed)
New to Dr. Charlotta Newton, was seeing Leotis Shames previously

## 2021-03-04 ENCOUNTER — Encounter: Payer: Self-pay | Admitting: Hematology and Oncology

## 2021-03-06 ENCOUNTER — Telehealth: Payer: Self-pay | Admitting: Internal Medicine

## 2021-03-06 NOTE — Telephone Encounter (Signed)
Request forwarded to office for review- Rx protocol does not give directions for giving over 90 day supply at a time-although it is probably acceptable in this case

## 2021-03-06 NOTE — Telephone Encounter (Signed)
Autumn from the pharmacy stated a refill request was sent for the medication atorvastatin (LIPITOR) 40 MG tablet . Autumn stated that insurance is requesting a 100-day supply of Rx in order for the patient to have a 30-day co-pay.

## 2021-03-07 NOTE — Telephone Encounter (Signed)
Spoke with Autumn at [facility]. She states that the insurance company is wanting a 100 day supply so atorvastatin prescription was approved for 100 day supply.

## 2021-03-12 ENCOUNTER — Other Ambulatory Visit: Payer: Self-pay

## 2021-03-13 MED ORDER — ALLOPURINOL 300 MG PO TABS: 300.0000 mg | ORAL_TABLET | Freq: Every day | ORAL | 0 refills | Status: DC

## 2021-03-19 ENCOUNTER — Other Ambulatory Visit: Payer: Self-pay | Admitting: *Deleted

## 2021-03-19 DIAGNOSIS — E538 Deficiency of other specified B group vitamins: Secondary | ICD-10-CM

## 2021-03-19 DIAGNOSIS — R7989 Other specified abnormal findings of blood chemistry: Secondary | ICD-10-CM

## 2021-03-19 DIAGNOSIS — D649 Anemia, unspecified: Secondary | ICD-10-CM

## 2021-03-20 ENCOUNTER — Other Ambulatory Visit: Payer: Medicare Other

## 2021-03-20 ENCOUNTER — Other Ambulatory Visit: Payer: Self-pay

## 2021-03-20 ENCOUNTER — Inpatient Hospital Stay: Payer: Medicare Other

## 2021-03-20 ENCOUNTER — Inpatient Hospital Stay (HOSPITAL_BASED_OUTPATIENT_CLINIC_OR_DEPARTMENT_OTHER): Payer: Medicare Other | Admitting: Oncology

## 2021-03-20 ENCOUNTER — Inpatient Hospital Stay: Payer: Medicare Other | Attending: Oncology

## 2021-03-20 ENCOUNTER — Encounter: Payer: Self-pay | Admitting: Oncology

## 2021-03-20 ENCOUNTER — Ambulatory Visit: Payer: Medicare Other

## 2021-03-20 ENCOUNTER — Ambulatory Visit: Payer: Medicare Other | Admitting: Oncology

## 2021-03-20 VITALS — BP 114/68 | HR 83 | Temp 99.8°F | Resp 16 | Ht 71.0 in | Wt 232.0 lb

## 2021-03-20 DIAGNOSIS — R200 Anesthesia of skin: Secondary | ICD-10-CM | POA: Insufficient documentation

## 2021-03-20 DIAGNOSIS — G629 Polyneuropathy, unspecified: Secondary | ICD-10-CM | POA: Insufficient documentation

## 2021-03-20 DIAGNOSIS — Z836 Family history of other diseases of the respiratory system: Secondary | ICD-10-CM | POA: Insufficient documentation

## 2021-03-20 DIAGNOSIS — R7401 Elevation of levels of liver transaminase levels: Secondary | ICD-10-CM | POA: Insufficient documentation

## 2021-03-20 DIAGNOSIS — Z8261 Family history of arthritis: Secondary | ICD-10-CM | POA: Diagnosis not present

## 2021-03-20 DIAGNOSIS — K709 Alcoholic liver disease, unspecified: Secondary | ICD-10-CM | POA: Diagnosis not present

## 2021-03-20 DIAGNOSIS — D61818 Other pancytopenia: Secondary | ICD-10-CM | POA: Insufficient documentation

## 2021-03-20 DIAGNOSIS — G609 Hereditary and idiopathic neuropathy, unspecified: Secondary | ICD-10-CM

## 2021-03-20 DIAGNOSIS — R202 Paresthesia of skin: Secondary | ICD-10-CM | POA: Diagnosis not present

## 2021-03-20 DIAGNOSIS — Z79899 Other long term (current) drug therapy: Secondary | ICD-10-CM | POA: Diagnosis not present

## 2021-03-20 DIAGNOSIS — N6310 Unspecified lump in the right breast, unspecified quadrant: Secondary | ICD-10-CM | POA: Diagnosis not present

## 2021-03-20 DIAGNOSIS — R7989 Other specified abnormal findings of blood chemistry: Secondary | ICD-10-CM | POA: Insufficient documentation

## 2021-03-20 DIAGNOSIS — E538 Deficiency of other specified B group vitamins: Secondary | ICD-10-CM

## 2021-03-20 DIAGNOSIS — Z809 Family history of malignant neoplasm, unspecified: Secondary | ICD-10-CM | POA: Diagnosis not present

## 2021-03-20 DIAGNOSIS — N63 Unspecified lump in unspecified breast: Secondary | ICD-10-CM

## 2021-03-20 DIAGNOSIS — D649 Anemia, unspecified: Secondary | ICD-10-CM

## 2021-03-20 DIAGNOSIS — I10 Essential (primary) hypertension: Secondary | ICD-10-CM | POA: Diagnosis not present

## 2021-03-20 DIAGNOSIS — Z8249 Family history of ischemic heart disease and other diseases of the circulatory system: Secondary | ICD-10-CM | POA: Insufficient documentation

## 2021-03-20 DIAGNOSIS — Z833 Family history of diabetes mellitus: Secondary | ICD-10-CM | POA: Insufficient documentation

## 2021-03-20 LAB — CBC WITH DIFFERENTIAL/PLATELET
Abs Immature Granulocytes: 0.03 K/uL (ref 0.00–0.07)
Basophils Absolute: 0 K/uL (ref 0.0–0.1)
Basophils Relative: 1 %
Eosinophils Absolute: 0.1 K/uL (ref 0.0–0.5)
Eosinophils Relative: 2 %
HCT: 33.6 % — ABNORMAL LOW (ref 39.0–52.0)
Hemoglobin: 10.8 g/dL — ABNORMAL LOW (ref 13.0–17.0)
Immature Granulocytes: 1 %
Lymphocytes Relative: 20 %
Lymphs Abs: 0.8 K/uL (ref 0.7–4.0)
MCH: 29.6 pg (ref 26.0–34.0)
MCHC: 32.1 g/dL (ref 30.0–36.0)
MCV: 92.1 fL (ref 80.0–100.0)
Monocytes Absolute: 0.6 K/uL (ref 0.1–1.0)
Monocytes Relative: 14 %
Neutro Abs: 2.7 K/uL (ref 1.7–7.7)
Neutrophils Relative %: 62 %
Platelets: 197 K/uL (ref 150–400)
RBC: 3.65 MIL/uL — ABNORMAL LOW (ref 4.22–5.81)
RDW: 14.8 % (ref 11.5–15.5)
WBC: 4.2 K/uL (ref 4.0–10.5)
nRBC: 0 % (ref 0.0–0.2)

## 2021-03-20 LAB — RETICULOCYTES
Immature Retic Fract: 12.6 % (ref 2.3–15.9)
RBC.: 3.75 MIL/uL — ABNORMAL LOW (ref 4.22–5.81)
Retic Count, Absolute: 67.9 10*3/uL (ref 19.0–186.0)
Retic Ct Pct: 1.8 % (ref 0.4–3.1)

## 2021-03-20 LAB — VITAMIN B12: Vitamin B-12: 454 pg/mL (ref 180–914)

## 2021-03-20 LAB — FERRITIN: Ferritin: 623 ng/mL — ABNORMAL HIGH (ref 24–336)

## 2021-03-20 LAB — FOLATE: Folate: 18 ng/mL

## 2021-03-20 MED ORDER — CYANOCOBALAMIN 1000 MCG/ML IJ SOLN
1000.0000 ug | Freq: Once | INTRAMUSCULAR | Status: AC
  Administered 2021-03-20: 1000 ug via INTRAMUSCULAR
  Filled 2021-03-20: qty 1

## 2021-03-20 MED ORDER — CYANOCOBALAMIN 1000 MCG/ML IJ SOLN: 1000.0000 ug | Freq: Once | INTRAMUSCULAR | Status: DC

## 2021-03-20 NOTE — Progress Notes (Signed)
Hematology/Oncology Consult note [facility]  Telephone:([phone] [phone]  Patient Care Team: Loura Pardon, MD as PCP - General (Internal Medicine) Toney Reil, MD as Consulting Physician (Gastroenterology) Creig Hines, MD as Consulting Physician (Hematology and Oncology)   Name of the patient: George Gomez  [id number]  04-15-1960   Date of visit: 03/20/21  Diagnosis- 1.  Normocytic anemia likely secondary to chronic disease 2.  Elevated ferritin secondary to alcoholic liver disease  Chief complaint/ Reason for visit-routine follow-up of anemia  Heme/Onc history: Patient is a 61 year old African-American male who is transferring his care from Dr. Merlene Pulling to me.He has been followed for his pancytopenia but mainly anemia.  Hemoglobin has remained around 10 for the last 3 years.  He also has mild intermittent leukopenia with a white count that fluctuates between 3.3-4 mainly neutropenia.  Platelet counts have been mostly normal except for intermittent mild low platelet counts between 120s to 140s.  He has prior history of B12 deficiency and he gets monthly B12 injections.  History of folate deficiency but of late that has been normal.  He had a flow cytometry in December 2020 that was normal.  Anemia work-up including TSH copper zinc ANA hepatitis testing and HIV testing was negative.Patient has had an elevated ferritin which has been again a longstanding issue and his ferritin levels fluctuate between [phone].  He had HFE gene testing done in the past which was negative.LFTs have shown mildly elevated AST and ALT intermittently but no hyperbilirubinemia.MR eneterogram in October 2021 showed normal liver size and configuration.  No splenomegaly.  Interval history-patient reports palpating a right breast mass over the last 1 to 2 months.  Continues to have tingling numbness in his hands and feet and has not yet been seen by neurology.  ECOG  PS- 1 Pain scale- 3   Review of systems- Review of Systems  Constitutional:  Negative for chills, fever, malaise/fatigue and weight loss.  HENT:  Negative for congestion, ear discharge and nosebleeds.   Eyes:  Negative for blurred vision.  Respiratory:  Negative for cough, hemoptysis, sputum production, shortness of breath and wheezing.   Cardiovascular:  Negative for chest pain, palpitations, orthopnea and claudication.  Gastrointestinal:  Negative for abdominal pain, blood in stool, constipation, diarrhea, heartburn, melena, nausea and vomiting.  Genitourinary:  Negative for dysuria, flank pain, frequency, hematuria and urgency.  Musculoskeletal:  Negative for back pain, joint pain and myalgias.  Skin:  Negative for rash.  Neurological:  Positive for sensory change (Peripheral neuropathy). Negative for dizziness, tingling, focal weakness, seizures, weakness and headaches.  Endo/Heme/Allergies:  Does not bruise/bleed easily.  Psychiatric/Behavioral:  Negative for depression and suicidal ideas. The patient does not have insomnia.      No Known Allergies   Past Medical History:  Diagnosis Date   Acute on chronic renal failure (HCC) 03/20/2016   Acute renal failure (ARF) (HCC) 08/28/2016   Acute renal failure (HCC) 10/12/2018   Anemia    B12 deficiency 12/11/2017   Clostridium difficile diarrhea 03/18/2016   Diabetes mellitus without complication (HCC)    Diverticulitis    Pt states diverticulitis   EtOH dependence (HCC)    Folate deficiency 07/25/2018   GI bleed 05/30/2017   Hypercholesteremia    Hypertension    Melena 10/21/2018   Weight loss 09/15/2018     Past Surgical History:  Procedure Laterality Date   COLONOSCOPY WITH PROPOFOL N/A 10/16/2018   Procedure: COLONOSCOPY WITH PROPOFOL;  Surgeon:  Lin Landsman, MD;  Location: [facility] ENDOSCOPY;  Service: Gastroenterology;  Laterality: N/A;   ENTEROSCOPY N/A 10/16/2018   Procedure: ENTEROSCOPY;  Surgeon: Lin Landsman,  MD;  Location: [facility] ENDOSCOPY;  Service: Gastroenterology;  Laterality: N/A;   ESOPHAGOGASTRODUODENOSCOPY N/A 07/02/2018   Procedure: ESOPHAGOGASTRODUODENOSCOPY (EGD);  Surgeon: Lin Landsman, MD;  Location: [facility] ENDOSCOPY;  Service: Gastroenterology;  Laterality: N/A;   ESOPHAGOGASTRODUODENOSCOPY (EGD) WITH PROPOFOL N/A 06/01/2017   Procedure: ESOPHAGOGASTRODUODENOSCOPY (EGD) WITH PROPOFOL;  Surgeon: Jonathon Bellows, MD;  Location: [facility] ENDOSCOPY;  Service: Gastroenterology;  Laterality: N/A;   GIVENS CAPSULE STUDY N/A 10/17/2018   Procedure: GIVENS CAPSULE STUDY;  Surgeon: Lin Landsman, MD;  Location: [facility] ENDOSCOPY;  Service: Gastroenterology;  Laterality: N/A;   GIVENS CAPSULE STUDY N/A 08/02/2019   Procedure: GIVENS CAPSULE STUDY;  Surgeon: Lin Landsman, MD;  Location: [facility] ENDOSCOPY;  Service: Gastroenterology;  Laterality: N/A;   none      Social History   Socioeconomic History   Marital status: Divorced    Spouse name: Not on file   Number of children: Not on file   Years of education: Not on file   Highest education level: Not on file  Occupational History   Occupation: on disability  Tobacco Use   Smoking status: Never   Smokeless tobacco: Never  Vaping Use   Vaping Use: Never used  Substance and Sexual Activity   Alcohol use: Yes    Comment: on occasion   Drug use: No   Sexual activity: Yes  Other Topics Concern   Not on file  Social History Narrative   Not on file   Social Determinants of Health   Financial Resource Strain: Not on file  Food Insecurity: Not on file  Transportation Needs: Not on file  Physical Activity: Not on file  Stress: Not on file  Social Connections: Not on file  Intimate Partner Violence: Not on file    Family History  Problem Relation Age of Onset   Rheum arthritis Mother    Diabetes Father    Heart disease Father    Cancer Father    COPD Sister    Rheum arthritis Sister    Diabetes Maternal Grandmother    Cancer  Maternal Grandfather    Diabetes Paternal Grandmother    Cancer Paternal Grandfather      Current Outpatient Medications:    allopurinol (ZYLOPRIM) 300 MG tablet, Take 1 tablet (300 mg total) by mouth daily. needs appointment for further refills, Disp: 30 tablet, Rfl: 0   amLODipine (NORVASC) 10 MG tablet, Take 10 mg by mouth daily., Disp: , Rfl:    atorvastatin (LIPITOR) 40 MG tablet, Take 1 tablet (40 mg total) by mouth daily., Disp: 90 tablet, Rfl: 1   gabapentin (NEURONTIN) 300 MG capsule, Take 1 capsule (300 mg total) by mouth 3 (three) times daily., Disp: 270 capsule, Rfl: 1   loratadine (CLARITIN) 10 MG tablet, Take 10 mg by mouth daily., Disp: , Rfl:    losartan (COZAAR) 100 MG tablet, Take 1 tablet (100 mg total) by mouth daily., Disp: 90 tablet, Rfl: 1   Magnesium Oxide 200 MG TABS, Take 1 tablet (200 mg total) by mouth 2 (two) times daily., Disp: 180 tablet, Rfl: 1   metFORMIN (GLUCOPHAGE-XR) 500 MG 24 hr tablet, Take 500 mg by mouth daily., Disp: , Rfl:    Multiple Vitamin (MULTIVITAMIN ADULT PO), Gnp Century Mature Multivitamin Tab 125c, Disp: , Rfl:    pantoprazole (PROTONIX) 40 MG tablet, Take  1 tablet (40 mg total) by mouth daily., Disp: 30 tablet, Rfl: 1   propranolol ER (INDERAL LA) 60 MG 24 hr capsule, Take 1 capsule (60 mg total) by mouth daily. needs appointment for further refills, Disp: 30 capsule, Rfl: 0   triamcinolone cream (KENALOG) 0.1 %, Apply 1 application topically 2 (two) times daily., Disp: 453.6 g, Rfl: 0   TYLENOL 500 MG tablet, Take 1,000 mg by mouth every 8 (eight) hours as needed., Disp: , Rfl:    VOLTAREN 1 % GEL, Apply 4 gram to affected area four times a day as needed for pain for knee pain. Do not exceed 16gm for LE joint per day, Disp: , Rfl:   Current Facility-Administered Medications:    cyanocobalamin ((VITAMIN B-12)) injection 1,000 mcg, 1,000 mcg, Intramuscular, Once, Sindy Guadeloupe, MD  Physical exam:  Vitals:   03/20/21 1304  BP: 114/68   Pulse: 83  Resp: 16  Temp: 99.8 F (37.7 C)  TempSrc: Tympanic  SpO2: 98%  Weight: 232 lb (105.2 kg)  Height: 5\' 11"  (1.803 m)   Physical Exam Constitutional:      General: He is not in acute distress. Cardiovascular:     Rate and Rhythm: Normal rate and regular rhythm.     Heart sounds: Normal heart sounds.  Pulmonary:     Effort: Pulmonary effort is normal.     Breath sounds: Normal breath sounds.  Abdominal:     General: Bowel sounds are normal.     Palpations: Abdomen is soft.  Skin:    General: Skin is warm and dry.  Neurological:     Mental Status: He is alert and oriented to person, place, and time.    Chest wall exam: Patient has mild bilateral gynecomastia but there is a palpable mass in the right breast in the retroareolar region  CMP Latest Ref Rng & Units 01/14/2021  Glucose 70 - 99 mg/dL 118(H)  BUN 8 - 27 mg/dL 21  Creatinine 0.76 - 1.27 mg/dL 1.15  Sodium 134 - 144 mmol/L 139  Potassium 3.5 - 5.2 mmol/L 4.2  Chloride 96 - 106 mmol/L 104  CO2 20 - 29 mmol/L 22  Calcium 8.6 - 10.2 mg/dL 9.9  Total Protein 6.0 - 8.5 g/dL 8.1  Total Bilirubin 0.0 - 1.2 mg/dL 0.4  Alkaline Phos 44 - 121 IU/L 130(H)  AST 0 - 40 IU/L 59(H)  ALT 0 - 44 IU/L 58(H)   CBC Latest Ref Rng & Units 03/20/2021  WBC 4.0 - 10.5 K/uL 4.2  Hemoglobin 13.0 - 17.0 g/dL 10.8(L)  Hematocrit 39.0 - 52.0 % 33.6(L)  Platelets 150 - 400 K/uL 197     Assessment and plan- Patient is a 61 y.o. male who is here for follow-up of following issues:  Normocytic anemia: Hemoglobin stable around 10.  Likely secondary to chronic disease.  Continue to monitor  2.  Elevated ferritin: Likely secondary to alcoholic liver disease.  He has had chronically elevated liver functions as well and admits to drinking alcohol daily.Ferritin levels today elevated at 623 but improved as compared to prior.  They wax and wane.  Continue to monitor  3.  There is a palpable mass in the right breast for which I will  obtain right breast mammogram and ultrasound and see him thereafter  4.  Peripheral neuropathy: Myeloma work-up in the past has been negative.  Refer to neurology  5.  B12 deficiency: Continue monthly B12 injections   Visit Diagnosis 1. Numbness and  tingling of both legs   2. Numbness and tingling of both feet   3. Breast mass in male   4. Swelling of breast   5. B12 deficiency   6. Normocytic anemia      Dr. Randa Evens, MD, MPH [facility] [id number] 03/20/2021 4:15 PM

## 2021-03-21 ENCOUNTER — Telehealth: Payer: Self-pay | Admitting: *Deleted

## 2021-03-21 NOTE — Telephone Encounter (Signed)
Faxed info to neurology for ref. Neuropathy of feet and legs bil. And no help from gabapentin

## 2021-03-24 NOTE — Telephone Encounter (Signed)
Transmission complete on 03/21/2021 to neurology and pt will be contacted with appt.

## 2021-03-25 ENCOUNTER — Ambulatory Visit: Payer: Medicare Other

## 2021-03-25 ENCOUNTER — Telehealth: Payer: Self-pay | Admitting: *Deleted

## 2021-03-25 NOTE — Telephone Encounter (Signed)
Called patient 2 times for appointment left message.  Patient called back office, patient was told I would call after I finished up with another patient he agreed excepting call in 5 minutes .   Tried two more times went straight to voice mail.

## 2021-03-28 ENCOUNTER — Other Ambulatory Visit: Payer: Self-pay | Admitting: Internal Medicine

## 2021-03-28 NOTE — Telephone Encounter (Signed)
Called patient to schedule appt. No answer, LVMTCB to schedule appt before any refills could be approved.  ?

## 2021-03-28 NOTE — Telephone Encounter (Signed)
Pt needs to schedule appt for refills 

## 2021-03-28 NOTE — Telephone Encounter (Signed)
Requested medication (s) are due for refill today: see note to hold  ? ?Requested medication (s) are on the active medication list: yes ? ?Last refill:  03/13/21 #30 0 refills ? ?Future visit scheduled: no ? ?Notes to clinic:  called patient to schedule appt no answer LVMTCB. Continue to hold ? ? ? ?  ?Requested Prescriptions  ?Pending Prescriptions Disp Refills  ? allopurinol (ZYLOPRIM) 300 MG tablet [Pharmacy Med Name: ALLOPURINOL 300MG  TABLETS] 30 tablet 0  ?  Sig: DO NOT FILL. THIS PRESCRIPTION WAS ON HOLD AT THE PREVIOUS PHARMACY, PLEASE CONTACT THE PRESCRIBER TO OBTAIN A NEW PRESCRIPTION.  ?  ? Endocrinology:  Gout Agents - allopurinol Failed - 03/28/2021 10:43 AM  ?  ?  Failed - CBC within normal limits and completed in the last 12 months  ?  WBC  ?Date Value Ref Range Status  ?03/20/2021 4.2 4.0 - 10.5 K/uL Final  ? ?RBC  ?Date Value Ref Range Status  ?03/20/2021 3.65 (L) 4.22 - 5.81 MIL/uL Final  ? ?RBC.  ?Date Value Ref Range Status  ?03/20/2021 3.75 (L) 4.22 - 5.81 MIL/uL Final  ? ?Hemoglobin  ?Date Value Ref Range Status  ?03/20/2021 10.8 (L) 13.0 - 17.0 g/dL Final  ?03/22/2021 23/55/7322 (L) 13.0 - 17.7 g/dL Final  ? ?HCT  ?Date Value Ref Range Status  ?03/20/2021 33.6 (L) 39.0 - 52.0 % Final  ? ?Hematocrit  ?Date Value Ref Range Status  ?01/14/2021 31.6 (L) 37.5 - 51.0 % Final  ? ?MCHC  ?Date Value Ref Range Status  ?03/20/2021 32.1 30.0 - 36.0 g/dL Final  ? ?MCH  ?Date Value Ref Range Status  ?03/20/2021 29.6 26.0 - 34.0 pg Final  ? ?MCV  ?Date Value Ref Range Status  ?03/20/2021 92.1 80.0 - 100.0 fL Final  ?01/14/2021 88 79 - 97 fL Final  ?09/19/2013 91 80 - 100 fL Final  ? ?No results found for: PLTCOUNTKUC, LABPLAT, POCPLA ?RDW  ?Date Value Ref Range Status  ?03/20/2021 14.8 11.5 - 15.5 % Final  ?01/14/2021 14.3 11.6 - 15.4 % Final  ?09/19/2013 15.5 (H) 11.5 - 14.5 % Final  ? ?  ?  ?  Passed - Uric Acid in normal range and within 360 days  ?  Uric Acid  ?Date Value Ref Range Status  ?07/19/2020 6.0 3.8 -  8.4 mg/dL Final  ?  Comment:  ?             Therapeutic target for gout patients: <6.0  ?  ?  ?  ?  Passed - Cr in normal range and within 360 days  ?  Creatinine  ?Date Value Ref Range Status  ?09/19/2013 1.25 0.60 - 1.30 mg/dL Final  ? ?Creatinine, Ser  ?Date Value Ref Range Status  ?01/14/2021 1.15 0.76 - 1.27 mg/dL Final  ?  ?  ?  ?  Passed - Valid encounter within last 12 months  ?  Recent Outpatient Visits   ? ?      ? 2 months ago Primary hypertension  ? [facility], Lauren A, NP  ? 8 months ago Prediabetes  ? [facility], Lauren A, NP  ? ?  ?  ?Future Appointments   ? ?        ? In 1 week ST. ELIZABETH OWEN, MD [facility] [place]  ? In 3 months Vigg, Avanti, MD [facility], [facility]  ? ?  ? ?  ?  ?  ? ?

## 2021-04-02 ENCOUNTER — Ambulatory Visit
Admission: RE | Admit: 2021-04-02 | Discharge: 2021-04-02 | Disposition: A | Discharge status: Home / Self Care | Payer: Medicare Other | Source: Ambulatory Visit | Attending: Oncology | Admitting: Oncology

## 2021-04-02 ENCOUNTER — Other Ambulatory Visit: Payer: Self-pay

## 2021-04-02 DIAGNOSIS — N63 Unspecified lump in unspecified breast: Secondary | ICD-10-CM | POA: Diagnosis not present

## 2021-04-02 DIAGNOSIS — R922 Inconclusive mammogram: Secondary | ICD-10-CM | POA: Diagnosis not present

## 2021-04-02 DIAGNOSIS — N62 Hypertrophy of breast: Secondary | ICD-10-CM | POA: Diagnosis not present

## 2021-04-02 NOTE — Addendum Note (Signed)
Addended by: Wilford Corner on: 04/02/2021 02:33 PM ? ? Modules accepted: Orders ? ?

## 2021-04-02 NOTE — Telephone Encounter (Signed)
Pt is calling back to follow up on Medication Refill.  ?Pt made an appointment for 04/03/2021 with PCP.  ? ?[facility] #[id number] [place], [place] - [address] AT [facility]  ?[address] [place] [address]  ?Phone: [phone] Fax: [phone]  ?Hours: Not open 24 hours  ? ?

## 2021-04-03 ENCOUNTER — Other Ambulatory Visit: Payer: Self-pay | Admitting: Internal Medicine

## 2021-04-03 ENCOUNTER — Encounter: Payer: Self-pay | Admitting: Internal Medicine

## 2021-04-03 ENCOUNTER — Ambulatory Visit (INDEPENDENT_AMBULATORY_CARE_PROVIDER_SITE_OTHER): Payer: Medicare Other | Admitting: Internal Medicine

## 2021-04-03 ENCOUNTER — Other Ambulatory Visit: Payer: Self-pay

## 2021-04-03 VITALS — BP 134/76 | HR 72 | Temp 98.7°F | Wt 229.0 lb

## 2021-04-03 DIAGNOSIS — E119 Type 2 diabetes mellitus without complications: Secondary | ICD-10-CM | POA: Diagnosis not present

## 2021-04-03 DIAGNOSIS — G629 Polyneuropathy, unspecified: Secondary | ICD-10-CM

## 2021-04-03 LAB — URINALYSIS, ROUTINE W REFLEX MICROSCOPIC
Bilirubin, UA: NEGATIVE
Glucose, UA: NEGATIVE
Ketones, UA: NEGATIVE
Leukocytes,UA: NEGATIVE
Nitrite, UA: NEGATIVE
RBC, UA: NEGATIVE
Specific Gravity, UA: 1.015 (ref 1.005–1.030)
Urobilinogen, Ur: 0.2 mg/dL (ref 0.2–1.0)
pH, UA: 7 (ref 5.0–7.5)

## 2021-04-03 LAB — MICROSCOPIC EXAMINATION
Bacteria, UA: NONE SEEN
Epithelial Cells (non renal): NONE SEEN /hpf (ref 0–10)
RBC, Urine: NONE SEEN /hpf (ref 0–2)
WBC, UA: NONE SEEN /hpf (ref 0–5)

## 2021-04-03 LAB — BAYER DCA HB A1C WAIVED: HB A1C (BAYER DCA - WAIVED): 6.1 % — ABNORMAL HIGH (ref 4.8–5.6)

## 2021-04-03 MED ORDER — PROPRANOLOL HCL ER 60 MG PO CP24: 60.0000 mg | ORAL_CAPSULE | Freq: Every day | ORAL | 1 refills | Status: DC

## 2021-04-03 MED ORDER — PANTOPRAZOLE SODIUM 40 MG PO TBEC: 40.0000 mg | DELAYED_RELEASE_TABLET | Freq: Every day | ORAL | 1 refills | Status: DC

## 2021-04-03 MED ORDER — AMLODIPINE BESYLATE 10 MG PO TABS: 10.0000 mg | ORAL_TABLET | Freq: Every day | ORAL | 1 refills | Status: DC

## 2021-04-03 MED ORDER — ALLOPURINOL 300 MG PO TABS: 300.0000 mg | ORAL_TABLET | Freq: Every day | ORAL | 0 refills | Status: DC

## 2021-04-03 MED ORDER — METFORMIN HCL ER 500 MG PO TB24: 500.0000 mg | ORAL_TABLET | Freq: Every day | ORAL | 1 refills | Status: DC

## 2021-04-03 MED ORDER — GABAPENTIN 300 MG PO CAPS: 600.0000 mg | ORAL_CAPSULE | Freq: Every day | ORAL | 2 refills | Status: DC

## 2021-04-03 NOTE — Progress Notes (Signed)
BP 134/76 (BP Location: Left Arm, Cuff Size: Normal)    Pulse 72    Temp 98.7 F (37.1 C) (Oral)    Wt 229 lb (103.9 kg)    SpO2 95%    BMI 31.94 kg/m    Subjective:    Patient ID: George Gomez, male    DOB: Mar 04, 1960, 61 y.o.   MRN: [id number]  No chief complaint on file.   HPI: George Gomez is a 61 y.o. male  Diabetes He presents for his follow-up diabetic visit. He has type 2 diabetes mellitus. His disease course has been improving. Pertinent negatives for diabetes include no blurred vision, no chest pain, no fatigue, no foot paresthesias, no foot ulcerations, no polydipsia, no polyphagia and no polyuria.  Hypertension This is a chronic problem. Pertinent negatives include no blurred vision or chest pain.   No chief complaint on file.   Relevant past medical, surgical, family and social history reviewed and updated as indicated. Interim medical history since our last visit reviewed. Allergies and medications reviewed and updated.  Review of Systems  Constitutional:  Negative for fatigue.  Eyes:  Negative for blurred vision.  Cardiovascular:  Negative for chest pain.  Endocrine: Negative for polydipsia, polyphagia and polyuria.   Per HPI unless specifically indicated above     Objective:    BP 134/76 (BP Location: Left Arm, Cuff Size: Normal)    Pulse 72    Temp 98.7 F (37.1 C) (Oral)    Wt 229 lb (103.9 kg)    SpO2 95%    BMI 31.94 kg/m   Wt Readings from Last 3 Encounters:  04/03/21 229 lb (103.9 kg)  03/20/21 232 lb (105.2 kg)  01/14/21 229 lb 6.4 oz (104.1 kg)    Physical Exam  Results for orders placed or performed in visit on 03/20/21  Reticulocytes  Result Value Ref Range   Retic Ct Pct 1.8 0.4 - 3.1 %   RBC. 3.75 (L) 4.22 - 5.81 MIL/uL   Retic Count, Absolute 67.9 19.0 - 186.0 K/uL   Immature Retic Fract 12.6 2.3 - 15.9 %  Folate  Result Value Ref Range   Folate 18.0 >5.9 ng/mL  Vitamin B12  Result Value Ref Range   Vitamin B-12 454 180 - 914  pg/mL  Ferritin  Result Value Ref Range   Ferritin 623 (H) 24 - 336 ng/mL  CBC with Differential/Platelet  Result Value Ref Range   WBC 4.2 4.0 - 10.5 K/uL   RBC 3.65 (L) 4.22 - 5.81 MIL/uL   Hemoglobin 10.8 (L) 13.0 - 17.0 g/dL   HCT 76.7 (L) 34.1 - 93.7 %   MCV 92.1 80.0 - 100.0 fL   MCH 29.6 26.0 - 34.0 pg   MCHC 32.1 30.0 - 36.0 g/dL   RDW 90.2 40.9 - 73.5 %   Platelets 197 150 - 400 K/uL   nRBC 0.0 0.0 - 0.2 %   Neutrophils Relative % 62 %   Neutro Abs 2.7 1.7 - 7.7 K/uL   Lymphocytes Relative 20 %   Lymphs Abs 0.8 0.7 - 4.0 K/uL   Monocytes Relative 14 %   Monocytes Absolute 0.6 0.1 - 1.0 K/uL   Eosinophils Relative 2 %   Eosinophils Absolute 0.1 0.0 - 0.5 K/uL   Basophils Relative 1 %   Basophils Absolute 0.0 0.0 - 0.1 K/uL   Immature Granulocytes 1 %   Abs Immature Granulocytes 0.03 0.00 - 0.07 K/uL        Current  Outpatient Medications:    atorvastatin (LIPITOR) 40 MG tablet, Take 1 tablet (40 mg total) by mouth daily., Disp: 90 tablet, Rfl: 1   gabapentin (NEURONTIN) 300 MG capsule, Take 1 capsule (300 mg total) by mouth 3 (three) times daily., Disp: 270 capsule, Rfl: 1   loratadine (CLARITIN) 10 MG tablet, Take 10 mg by mouth daily., Disp: , Rfl:    losartan (COZAAR) 100 MG tablet, Take 1 tablet (100 mg total) by mouth daily., Disp: 90 tablet, Rfl: 1   Magnesium Oxide 200 MG TABS, Take 1 tablet (200 mg total) by mouth 2 (two) times daily., Disp: 180 tablet, Rfl: 1   Multiple Vitamin (MULTIVITAMIN ADULT PO), Gnp Century Mature Multivitamin Tab 125c, Disp: , Rfl:    triamcinolone cream (KENALOG) 0.1 %, Apply 1 application topically 2 (two) times daily., Disp: 453.6 g, Rfl: 0   TYLENOL 500 MG tablet, Take 1,000 mg by mouth every 8 (eight) hours as needed., Disp: , Rfl:    VOLTAREN 1 % GEL, Apply 4 gram to affected area four times a day as needed for pain for knee pain. Do not exceed 16gm for LE joint per day, Disp: , Rfl:    allopurinol (ZYLOPRIM) 300 MG tablet, Take  1 tablet (300 mg total) by mouth daily. needs appointment for further refills, Disp: 30 tablet, Rfl: 0   amLODipine (NORVASC) 10 MG tablet, Take 1 tablet (10 mg total) by mouth daily., Disp: 90 tablet, Rfl: 1   metFORMIN (GLUCOPHAGE-XR) 500 MG 24 hr tablet, Take 1 tablet (500 mg total) by mouth daily., Disp: 90 tablet, Rfl: 1   pantoprazole (PROTONIX) 40 MG tablet, Take 1 tablet (40 mg total) by mouth daily., Disp: 90 tablet, Rfl: 1   propranolol ER (INDERAL LA) 60 MG 24 hr capsule, Take 1 capsule (60 mg total) by mouth daily. needs appointment for further refills, Disp: 90 capsule, Rfl: 1    Assessment & Plan:  DM is on metformin for such a1c at 6.1 check HbA1c,  urine  microalbumin  diabetic diet plan given to pt  adviced regarding hypoglycemia and instructions given to pt today on how to prevent and treat the same if it were to occur. pt acknowledges the plan and voices understanding of the same.  exercise plan given and encouraged.   advice diabetic yearly podiatry, ophthalmology , nutritionist , dental check q 6 months,   HTN  Continue current meds.  Medication compliance emphasised. pt advised to keep Bp logs. Pt verbalised understanding of the same. Pt to have a low salt diet . Exercise to reach a goal of at least 150 mins a week.  lifestyle modifications explained and pt understands importance of the above. Under good control on current regimen. Continue current regimen. Continue to monitor. Call with any concerns. Refills given. Labs drawn today.  HLD is on iptro 40 mg recheck FLP, check LFT's work on diet, SE of meds explained to pt. low fat and high fiber diet explained to pt.  Gout ; stable is on allopurinol for such  not acutely inflammed  -  is on allopurinol will check URIC acid levels today   Neuropathy  sec to B12 def causing neuropathy vs etoh abuse. Referred to see neurology  Will need to continue b12 shots mised them  septemeber   Latest Reference Range & Units 09/12/20  11:03 01/14/21 09:41 03/20/21 12:45  Hemoglobin 13.0 - 17.0 g/dL 40.910.3 (L) 81.110.7 (L) 91.410.8 (L)    Etoh abuse :  Drinks  3 shots a day of vodka every day has cut back some.       Problem List Items Addressed This Visit       Nervous and Auditory   Neuropathy - Primary   Relevant Medications   allopurinol (ZYLOPRIM) 300 MG tablet   amLODipine (NORVASC) 10 MG tablet   metFORMIN (GLUCOPHAGE-XR) 500 MG 24 hr tablet   propranolol ER (INDERAL LA) 60 MG 24 hr capsule   pantoprazole (PROTONIX) 40 MG tablet   gabapentin (NEURONTIN) 300 MG capsule   Other Relevant Orders   TSH (Completed)   Bayer DCA Hb A1c Waived (Completed)   Lipid panel (Completed)   Basic metabolic panel (Completed)   Vitamin B12 (Completed)   Urinalysis, Routine w reflex microscopic (Completed)   Microscopic Examination (Completed)   Other Visit Diagnoses     Diabetes mellitus without complication (HCC)       Relevant Medications   allopurinol (ZYLOPRIM) 300 MG tablet   amLODipine (NORVASC) 10 MG tablet   metFORMIN (GLUCOPHAGE-XR) 500 MG 24 hr tablet   propranolol ER (INDERAL LA) 60 MG 24 hr capsule   pantoprazole (PROTONIX) 40 MG tablet   gabapentin (NEURONTIN) 300 MG capsule   Other Relevant Orders   TSH (Completed)   Bayer DCA Hb A1c Waived (Completed)   Lipid panel (Completed)   Basic metabolic panel (Completed)   Vitamin B12 (Completed)   Urinalysis, Routine w reflex microscopic (Completed)   Microscopic Examination (Completed)        Orders Placed This Encounter  Procedures   Microscopic Examination   TSH   Bayer DCA Hb A1c Waived   Lipid panel   Basic metabolic panel   Vitamin B12   Urinalysis, Routine w reflex microscopic     Meds ordered this encounter  Medications   allopurinol (ZYLOPRIM) 300 MG tablet    Sig: Take 1 tablet (300 mg total) by mouth daily. needs appointment for further refills    Dispense:  30 tablet    Refill:  0   amLODipine (NORVASC) 10 MG tablet    Sig: Take 1  tablet (10 mg total) by mouth daily.    Dispense:  90 tablet    Refill:  1   metFORMIN (GLUCOPHAGE-XR) 500 MG 24 hr tablet    Sig: Take 1 tablet (500 mg total) by mouth daily.    Dispense:  90 tablet    Refill:  1   propranolol ER (INDERAL LA) 60 MG 24 hr capsule    Sig: Take 1 capsule (60 mg total) by mouth daily. needs appointment for further refills    Dispense:  90 capsule    Refill:  1   pantoprazole (PROTONIX) 40 MG tablet    Sig: Take 1 tablet (40 mg total) by mouth daily.    Dispense:  90 tablet    Refill:  1     Follow up plan: No follow-ups on file.

## 2021-04-04 ENCOUNTER — Telehealth: Payer: Self-pay

## 2021-04-04 LAB — BASIC METABOLIC PANEL
BUN/Creatinine Ratio: 14 (ref 10–24)
BUN: 20 mg/dL (ref 8–27)
CO2: 21 mmol/L (ref 20–29)
Calcium: 10.3 mg/dL — ABNORMAL HIGH (ref 8.6–10.2)
Chloride: 100 mmol/L (ref 96–106)
Creatinine, Ser: 1.45 mg/dL — ABNORMAL HIGH (ref 0.76–1.27)
Glucose: 138 mg/dL — ABNORMAL HIGH (ref 70–99)
Potassium: 4.6 mmol/L (ref 3.5–5.2)
Sodium: 140 mmol/L (ref 134–144)
eGFR: 54 mL/min/{1.73_m2} — ABNORMAL LOW (ref >59)

## 2021-04-04 LAB — LIPID PANEL
Chol/HDL Ratio: 2.3 ratio (ref 0.0–5.0)
Cholesterol, Total: 202 mg/dL — ABNORMAL HIGH (ref 100–199)
HDL: 89 mg/dL (ref >39)
LDL Chol Calc (NIH): 89 mg/dL (ref 0–99)
Triglycerides: 142 mg/dL (ref 0–149)
VLDL Cholesterol Cal: 24 mg/dL (ref 5–40)

## 2021-04-04 LAB — TSH: TSH: 3.85 u[IU]/mL (ref 0.450–4.500)

## 2021-04-04 LAB — VITAMIN B12: Vitamin B-12: 726 pg/mL (ref 232–1245)

## 2021-04-04 NOTE — Telephone Encounter (Signed)
Reached out to [facility] to check on pts referral that was sent on 03/21/21 for neuropathy. Per front desk, they have reached out to pt with no answer, was going to call pt today after their phone turn off.  ?

## 2021-04-04 NOTE — Telephone Encounter (Signed)
Requested Prescriptions  ?Pending Prescriptions Disp Refills  ?? allopurinol (ZYLOPRIM) 300 MG tablet [Pharmacy Med Name: ALLOPURINOL 300MG  TABLETS] 90 tablet   ?  Sig: TAKE 1 TABLET(300 MG) BY MOUTH DAILY  ?  ? Endocrinology:  Gout Agents - allopurinol Failed - 04/03/2021 12:18 PM  ?  ?  Failed - CBC within normal limits and completed in the last 12 months  ?  WBC  ?Date Value Ref Range Status  ?03/20/2021 4.2 4.0 - 10.5 K/uL Final  ? ?RBC  ?Date Value Ref Range Status  ?03/20/2021 3.65 (L) 4.22 - 5.81 MIL/uL Final  ? ?RBC.  ?Date Value Ref Range Status  ?03/20/2021 3.75 (L) 4.22 - 5.81 MIL/uL Final  ? ?Hemoglobin  ?Date Value Ref Range Status  ?03/20/2021 10.8 (L) 13.0 - 17.0 g/dL Final  ?01/14/2021 10.7 (L) 13.0 - 17.7 g/dL Final  ? ?HCT  ?Date Value Ref Range Status  ?03/20/2021 33.6 (L) 39.0 - 52.0 % Final  ? ?Hematocrit  ?Date Value Ref Range Status  ?01/14/2021 31.6 (L) 37.5 - 51.0 % Final  ? ?MCHC  ?Date Value Ref Range Status  ?03/20/2021 32.1 30.0 - 36.0 g/dL Final  ? ?MCH  ?Date Value Ref Range Status  ?03/20/2021 29.6 26.0 - 34.0 pg Final  ? ?MCV  ?Date Value Ref Range Status  ?03/20/2021 92.1 80.0 - 100.0 fL Final  ?01/14/2021 88 79 - 97 fL Final  ?09/19/2013 91 80 - 100 fL Final  ? ?No results found for: PLTCOUNTKUC, LABPLAT, [place] ?RDW  ?Date Value Ref Range Status  ?03/20/2021 14.8 11.5 - 15.5 % Final  ?01/14/2021 14.3 11.6 - 15.4 % Final  ?09/19/2013 15.5 (H) 11.5 - 14.5 % Final  ? ?  ?  ?  Passed - Uric Acid in normal range and within 360 days  ?  Uric Acid  ?Date Value Ref Range Status  ?07/19/2020 6.0 3.8 - 8.4 mg/dL Final  ?  Comment:  ?             Therapeutic target for gout patients: <6.0  ?   ?  ?  Passed - Cr in normal range and within 360 days  ?  Creatinine  ?Date Value Ref Range Status  ?09/19/2013 1.25 0.60 - 1.30 mg/dL Final  ? ?Creatinine, Ser  ?Date Value Ref Range Status  ?04/03/2021 1.45 (H) 0.76 - 1.27 mg/dL Final  ?   ?  ?  Passed - Valid encounter within last 12 months  ?  Recent  Outpatient Visits   ?      ? Yesterday Neuropathy  ? [facility] Vigg, Avanti, MD  ? 2 months ago Primary hypertension  ? [facility], Lauren A, NP  ? 8 months ago Prediabetes  ? [facility], Scheryl Darter, NP  ?  ?  ?Future Appointments   ?        ? Tomorrow Sindy Guadeloupe, MD [facility] [place] [facility]  ? In 2 months Vigg, Avanti, MD [facility]  ?  ? ?  ?  ?  ? ?Receipt confirmed by pharmacy 04/03/21  At 1006am ?

## 2021-04-05 ENCOUNTER — Inpatient Hospital Stay: Payer: Medicare Other | Attending: Oncology | Admitting: Oncology

## 2021-04-05 ENCOUNTER — Encounter: Payer: Self-pay | Admitting: Oncology

## 2021-04-05 DIAGNOSIS — E538 Deficiency of other specified B group vitamins: Secondary | ICD-10-CM | POA: Insufficient documentation

## 2021-04-05 DIAGNOSIS — D509 Iron deficiency anemia, unspecified: Secondary | ICD-10-CM

## 2021-04-08 ENCOUNTER — Encounter: Payer: Self-pay | Admitting: Internal Medicine

## 2021-04-11 DIAGNOSIS — Z789 Other specified health status: Secondary | ICD-10-CM | POA: Diagnosis not present

## 2021-04-11 DIAGNOSIS — R208 Other disturbances of skin sensation: Secondary | ICD-10-CM | POA: Diagnosis not present

## 2021-04-11 DIAGNOSIS — Z7689 Persons encountering health services in other specified circumstances: Secondary | ICD-10-CM | POA: Diagnosis not present

## 2021-04-11 DIAGNOSIS — R278 Other lack of coordination: Secondary | ICD-10-CM | POA: Diagnosis not present

## 2021-04-11 DIAGNOSIS — R202 Paresthesia of skin: Secondary | ICD-10-CM | POA: Diagnosis not present

## 2021-04-11 DIAGNOSIS — R200 Anesthesia of skin: Secondary | ICD-10-CM | POA: Diagnosis not present

## 2021-04-11 DIAGNOSIS — E1349 Other specified diabetes mellitus with other diabetic neurological complication: Secondary | ICD-10-CM | POA: Diagnosis not present

## 2021-04-14 NOTE — Progress Notes (Deleted)
I connected with George Gomez on 04/14/21 at  3:00 PM EST by video enabled telemedicine visit and verified that I am speaking with the correct person using two identifiers. ?  ?I discussed the limitations, risks, security and privacy concerns of performing an evaluation and management service by telemedicine and the availability of in-person appointments. I also discussed with the patient that there may be a patient responsible charge related to this service. The patient expressed understanding and agreed to proceed. ? ?Other persons participating in the visit and their role in the encounter:  none ? ?Patient's location:  home ?Provider's location:  work ? ?Chief Complaint:  1.  Normocytic anemia likely secondary to chronic disease ?2.  Elevated ferritin secondary to alcoholic liver disease ?  ? ?History of present illness: Patient is a 61 year old African-American male who is transferring his care from Dr. Merlene Pulling to me.He has been followed for his pancytopenia but mainly anemia.  Hemoglobin has remained around 10 for the last 3 years.  He also has mild intermittent leukopenia with a white count that fluctuates between 3.3-4 mainly neutropenia.  Platelet counts have been mostly normal except for intermittent mild low platelet counts between 120s to 140s.  He has prior history of B12 deficiency and he gets monthly B12 injections.  History of folate deficiency but of late that has been normal.  He had a flow cytometry in December 2020 that was normal.  Anemia work-up including TSH copper zinc ANA hepatitis testing and HIV testing was negative.Patient has had an elevated ferritin which has been again a longstanding issue and his ferritin levels fluctuate between [phone].  He had HFE gene testing done in the past which was negative.LFTs have shown mildly elevated AST and ALT intermittently but no hyperbilirubinemia.MR eneterogram in October 2021 showed normal liver size and configuration.  No  splenomegaly. ? ?Interval history doing well. Denies any specific complaints other than chronic tingling numbness in his hands and feet ? ? ?Review of Systems  ?Constitutional:  Negative for chills, fever, malaise/fatigue and weight loss.  ?HENT:  Negative for congestion, ear discharge and nosebleeds.   ?Eyes:  Negative for blurred vision.  ?Respiratory:  Negative for cough, hemoptysis, sputum production, shortness of breath and wheezing.   ?Cardiovascular:  Negative for chest pain, palpitations, orthopnea and claudication.  ?Gastrointestinal:  Negative for abdominal pain, blood in stool, constipation, diarrhea, heartburn, melena, nausea and vomiting.  ?Genitourinary:  Negative for dysuria, flank pain, frequency, hematuria and urgency.  ?Musculoskeletal:  Negative for back pain, joint pain and myalgias.  ?Skin:  Negative for rash.  ?Neurological:  Positive for sensory change (peripheral neuropathy). Negative for dizziness, tingling, focal weakness, seizures, weakness and headaches.  ?Endo/Heme/Allergies:  Does not bruise/bleed easily.  ?Psychiatric/Behavioral:  Negative for depression and suicidal ideas. The patient does not have insomnia.   ? ?No Known Allergies ? ?Past Medical History:  ?Diagnosis Date  ? Acute on chronic renal failure (HCC) 03/20/2016  ? Acute renal failure (ARF) (HCC) 08/28/2016  ? Acute renal failure (HCC) 10/12/2018  ? Anemia   ? B12 deficiency 12/11/2017  ? Clostridium difficile diarrhea 03/18/2016  ? Diabetes mellitus without complication (HCC)   ? Diverticulitis   ? Pt states diverticulitis  ? EtOH dependence (HCC)   ? Folate deficiency 07/25/2018  ? GI bleed 05/30/2017  ? Hypercholesteremia   ? Hypertension   ? Melena 10/21/2018  ? Weight loss 09/15/2018  ? ? ?Past Surgical History:  ?Procedure Laterality Date  ? COLONOSCOPY WITH PROPOFOL N/A  10/16/2018  ? Procedure: COLONOSCOPY WITH PROPOFOL;  Surgeon: Toney Reil, MD;  Location: [facility] ENDOSCOPY;  Service: Gastroenterology;  Laterality: N/A;   ? ENTEROSCOPY N/A 10/16/2018  ? Procedure: ENTEROSCOPY;  Surgeon: Toney Reil, MD;  Location: [facility] ENDOSCOPY;  Service: Gastroenterology;  Laterality: N/A;  ? ESOPHAGOGASTRODUODENOSCOPY N/A 07/02/2018  ? Procedure: ESOPHAGOGASTRODUODENOSCOPY (EGD);  Surgeon: Toney Reil, MD;  Location: [facility] ENDOSCOPY;  Service: Gastroenterology;  Laterality: N/A;  ? ESOPHAGOGASTRODUODENOSCOPY (EGD) WITH PROPOFOL N/A 06/01/2017  ? Procedure: ESOPHAGOGASTRODUODENOSCOPY (EGD) WITH PROPOFOL;  Surgeon: Wyline Mood, MD;  Location: [facility] ENDOSCOPY;  Service: Gastroenterology;  Laterality: N/A;  ? GIVENS CAPSULE STUDY N/A 10/17/2018  ? Procedure: GIVENS CAPSULE STUDY;  Surgeon: Toney Reil, MD;  Location[facility] ENDOSCOPY;  Service: Gastroenterology;  Laterality: N/A;  ? GIVENS CAPSULE STUDY N/A 08/02/2019  ? Procedure: GIVENS CAPSULE STUDY;  Surgeon: Toney Reil, MD;  Location: [facility] ENDOSCOPY;  Service: Gastroenterology;  Laterality: N/A;  ? none    ? ? ?Social History  ? ?Socioeconomic History  ? Marital status: Divorced  ?  Spouse name: Not on file  ? Number of children: Not on file  ? Years of education: Not on file  ? Highest education level: Not on file  ?Occupational History  ? Occupation: on disability  ?Tobacco Use  ? Smoking status: Never  ? Smokeless tobacco: Never  ?Vaping Use  ? Vaping Use: Never used  ?Substance and Sexual Activity  ? Alcohol use: Yes  ?  Comment: on occasion  ? Drug use: No  ? Sexual activity: Yes  ?Other Topics Concern  ? Not on file  ?Social History Narrative  ? Not on file  ? ?Social Determinants of Health  ? ?Financial Resource Strain: Not on file  ?Food Insecurity: Not on file  ?Transportation Needs: Not on file  ?Physical Activity: Not on file  ?Stress: Not on file  ?Social Connections: Not on file  ?Intimate Partner Violence: Not on file  ? ? ?Family History  ?Problem Relation Age of Onset  ? Rheum arthritis Mother   ? Diabetes Father   ? Heart disease Father   ? Cancer Father   ?  COPD Sister   ? Rheum arthritis Sister   ? Diabetes Maternal Grandmother   ? Cancer Maternal Grandfather   ? Diabetes Paternal Grandmother   ? Cancer Paternal Grandfather   ? ? ? ?Current Outpatient Medications:  ?  allopurinol (ZYLOPRIM) 300 MG tablet, Take 1 tablet (300 mg total) by mouth daily. needs appointment for further refills, Disp: 30 tablet, Rfl: 0 ?  amLODipine (NORVASC) 10 MG tablet, Take 1 tablet (10 mg total) by mouth daily., Disp: 90 tablet, Rfl: 1 ?  atorvastatin (LIPITOR) 40 MG tablet, Take 1 tablet (40 mg total) by mouth daily., Disp: 90 tablet, Rfl: 1 ?  gabapentin (NEURONTIN) 300 MG capsule, Take 2 capsules (600 mg total) by mouth at bedtime., Disp: 60 capsule, Rfl: 2 ?  loratadine (CLARITIN) 10 MG tablet, Take 10 mg by mouth daily., Disp: , Rfl:  ?  losartan (COZAAR) 100 MG tablet, Take 1 tablet (100 mg total) by mouth daily., Disp: 90 tablet, Rfl: 1 ?  Magnesium Oxide 200 MG TABS, Take 1 tablet (200 mg total) by mouth 2 (two) times daily., Disp: 180 tablet, Rfl: 1 ?  metFORMIN (GLUCOPHAGE-XR) 500 MG 24 hr tablet, Take 1 tablet (500 mg total) by mouth daily., Disp: 90 tablet, Rfl: 1 ?  Multiple Vitamin (MULTIVITAMIN ADULT PO), Gnp Century Mature  Multivitamin Tab 125c, Disp: , Rfl:  ?  pantoprazole (PROTONIX) 40 MG tablet, Take 1 tablet (40 mg total) by mouth daily., Disp: 90 tablet, Rfl: 1 ?  propranolol ER (INDERAL LA) 60 MG 24 hr capsule, Take 1 capsule (60 mg total) by mouth daily. needs appointment for further refills, Disp: 90 capsule, Rfl: 1 ?  triamcinolone cream (KENALOG) 0.1 %, Apply 1 application topically 2 (two) times daily., Disp: 453.6 g, Rfl: 0 ?  TYLENOL 500 MG tablet, Take 1,000 mg by mouth every 8 (eight) hours as needed., Disp: , Rfl:  ?  VOLTAREN 1 % GEL, Apply 4 gram to affected area four times a day as needed for pain for knee pain. Do not exceed 16gm for LE joint per day, Disp: , Rfl:  ? ?US Breast Limited Uni Right Inc Axilla ? ?Result Date: 04/02/2021 ?CLINICAL DATA:   Patient reports thickening behind the RIGHT nipple for 2 months. EXAM: DIGITAL DIAGNOSTIC BILATERAL MAMMOGRAM WITH TOMOSYNTHESIS AND CAD; ULTRASOUND RIGHT BREAST LIMITED TECHNIQUE: Bilateral digital diagnostic mammography an

## 2021-04-15 ENCOUNTER — Telehealth: Payer: Self-pay | Admitting: Internal Medicine

## 2021-04-15 NOTE — Telephone Encounter (Signed)
Eveyrthing apart from kidney funciton looks great.  ?He needs to increase water intake and fu in June. Recheck cmp then pleas.e thnx.

## 2021-04-15 NOTE — Telephone Encounter (Signed)
Patient made aware of results and verbalized understanding.  

## 2021-04-15 NOTE — Telephone Encounter (Signed)
Copied from CRM [id number]. Topic: General - Other ?>> Apr 15, 2021 11:00 AM Payton Spark N wrote: ?Reason for CRM: Pt called in about his lab results he had done on 04/03/21, and requested a call back for someone to go over them with him, please advise. ?

## 2021-04-16 DIAGNOSIS — R200 Anesthesia of skin: Secondary | ICD-10-CM | POA: Diagnosis not present

## 2021-04-17 ENCOUNTER — Other Ambulatory Visit: Payer: Self-pay

## 2021-04-17 ENCOUNTER — Inpatient Hospital Stay: Payer: Medicare Other

## 2021-04-17 DIAGNOSIS — E538 Deficiency of other specified B group vitamins: Secondary | ICD-10-CM

## 2021-04-17 MED ORDER — CYANOCOBALAMIN 1000 MCG/ML IJ SOLN
1000.0000 ug | INTRAMUSCULAR | Status: DC
  Administered 2021-04-17: 1000 ug via INTRAMUSCULAR
  Filled 2021-04-17: qty 1

## 2021-04-23 DIAGNOSIS — E113211 Type 2 diabetes mellitus with mild nonproliferative diabetic retinopathy with macular edema, right eye: Secondary | ICD-10-CM | POA: Diagnosis not present

## 2021-04-23 DIAGNOSIS — E113292 Type 2 diabetes mellitus with mild nonproliferative diabetic retinopathy without macular edema, left eye: Secondary | ICD-10-CM | POA: Diagnosis not present

## 2021-04-24 ENCOUNTER — Inpatient Hospital Stay: Payer: Medicare Other | Admitting: Oncology

## 2021-04-26 DIAGNOSIS — M5416 Radiculopathy, lumbar region: Secondary | ICD-10-CM | POA: Diagnosis not present

## 2021-04-26 DIAGNOSIS — M25551 Pain in right hip: Secondary | ICD-10-CM | POA: Diagnosis not present

## 2021-04-30 ENCOUNTER — Other Ambulatory Visit: Payer: Self-pay | Admitting: Nurse Practitioner

## 2021-05-02 DIAGNOSIS — R202 Paresthesia of skin: Secondary | ICD-10-CM | POA: Diagnosis not present

## 2021-05-02 DIAGNOSIS — R200 Anesthesia of skin: Secondary | ICD-10-CM | POA: Diagnosis not present

## 2021-05-02 DIAGNOSIS — R278 Other lack of coordination: Secondary | ICD-10-CM | POA: Diagnosis not present

## 2021-05-02 DIAGNOSIS — G629 Polyneuropathy, unspecified: Secondary | ICD-10-CM | POA: Diagnosis not present

## 2021-05-02 DIAGNOSIS — Z789 Other specified health status: Secondary | ICD-10-CM | POA: Diagnosis not present

## 2021-05-02 DIAGNOSIS — E1349 Other specified diabetes mellitus with other diabetic neurological complication: Secondary | ICD-10-CM | POA: Diagnosis not present

## 2021-05-06 ENCOUNTER — Encounter: Payer: Self-pay | Admitting: Oncology

## 2021-05-06 ENCOUNTER — Inpatient Hospital Stay: Payer: Medicare Other | Attending: Oncology | Admitting: Oncology

## 2021-05-06 VITALS — BP 121/67 | HR 85 | Temp 97.6°F | Resp 16 | Wt 228.7 lb

## 2021-05-06 DIAGNOSIS — N62 Hypertrophy of breast: Secondary | ICD-10-CM | POA: Insufficient documentation

## 2021-05-06 DIAGNOSIS — R7989 Other specified abnormal findings of blood chemistry: Secondary | ICD-10-CM | POA: Insufficient documentation

## 2021-05-06 DIAGNOSIS — E538 Deficiency of other specified B group vitamins: Secondary | ICD-10-CM | POA: Diagnosis not present

## 2021-05-06 DIAGNOSIS — D649 Anemia, unspecified: Secondary | ICD-10-CM | POA: Diagnosis not present

## 2021-05-06 NOTE — Progress Notes (Signed)
? ? ? ?Hematology/Oncology Consult note ?[facility]  ?[phone] [phone] ? ?Patient Care Team: ?Charlynne Cousins, MD as PCP - General (Internal Medicine) ?Lin Landsman, MD as Consulting Physician (Gastroenterology) ?Sindy Guadeloupe, MD as Consulting Physician (Hematology and Oncology)  ? ?Name of the patient: George Gomez  ?ED:[id number]  ?Mar 12, 1960  ? ?Date of visit: 05/06/21 ? ?Diagnosis-normocytic anemia likely secondary to chronic disease ? ?Chief complaint/ Reason for visit-discuss breast ultrasound results and further management ? ?Heme/Onc history: Patient is a 61 year old African-American male who is transferring his care from Dr. Mike Gip to me.He has been followed for his pancytopenia but mainly anemia.  Hemoglobin has remained around 10 for the last 3 years.  He also has mild intermittent leukopenia with a white count that fluctuates between 3.3-4 mainly neutropenia.  Platelet counts have been mostly normal except for intermittent mild low platelet counts between 120s to 140s.  He has prior history of B12 deficiency and he gets monthly B12 injections.  History of folate deficiency but of late that has been normal.  He had a flow cytometry in December 2020 that was normal.  Anemia work-up including TSH copper zinc ANA hepatitis testing and HIV testing was negative.Patient has had an elevated ferritin which has been again a longstanding issue and his ferritin levels fluctuate between [phone].  He had HFE gene testing done in the past which was negative.LFTs have shown mildly elevated AST and ALT intermittently but no hyperbilirubinemia.MR eneterogram in October 2021 showed normal liver size and configuration.  No splenomegaly. ?  ? ?Interval history-reports ongoing back pain for which she will be seeing physical therapy soon.  He was also evaluated by neurology for his ongoing complaints of neuropathy and was started on gabapentin.  Right breast mass has not  changed significantly and does not bother him. ? ?ECOG PS- 1 ?Pain scale- 4-low back pain ? ? ?Review of systems- Review of Systems  ?Constitutional:  Negative for chills, fever, malaise/fatigue and weight loss.  ?HENT:  Negative for congestion, ear discharge and nosebleeds.   ?Eyes:  Negative for blurred vision.  ?Respiratory:  Negative for cough, hemoptysis, sputum production, shortness of breath and wheezing.   ?Cardiovascular:  Negative for chest pain, palpitations, orthopnea and claudication.  ?Gastrointestinal:  Negative for abdominal pain, blood in stool, constipation, diarrhea, heartburn, melena, nausea and vomiting.  ?Genitourinary:  Negative for dysuria, flank pain, frequency, hematuria and urgency.  ?Musculoskeletal:  Negative for back pain, joint pain and myalgias.  ?Skin:  Negative for rash.  ?Neurological:  Negative for dizziness, tingling, focal weakness, seizures, weakness and headaches.  ?Endo/Heme/Allergies:  Does not bruise/bleed easily.  ?Psychiatric/Behavioral:  Negative for depression and suicidal ideas. The patient does not have insomnia.    ? ? ?No Known Allergies ? ? ?Past Medical History:  ?Diagnosis Date  ? Acute on chronic renal failure ([place]) 03/20/2016  ? Acute renal failure (ARF) ([place]) 08/28/2016  ? Acute renal failure ([place]) 10/12/2018  ? Anemia   ? B12 deficiency 12/11/2017  ? Clostridium difficile diarrhea 03/18/2016  ? Diabetes mellitus without complication ([place])   ? Diverticulitis   ? Pt states diverticulitis  ? EtOH dependence ([place])   ? Folate deficiency 07/25/2018  ? GI bleed 05/30/2017  ? Hypercholesteremia   ? Hypertension   ? Melena 10/21/2018  ? Weight loss 09/15/2018  ? ? ? ?Past Surgical History:  ?Procedure Laterality Date  ? COLONOSCOPY WITH PROPOFOL N/A 10/16/2018  ? Procedure: COLONOSCOPY WITH PROPOFOL;  Surgeon:  Lin Landsman, MD;  Location: [facility] ENDOSCOPY;  Service: Gastroenterology;  Laterality: N/A;  ? ENTEROSCOPY N/A 10/16/2018  ? Procedure: ENTEROSCOPY;  Surgeon: Lin Landsman, MD;  Location: [facility] ENDOSCOPY;  Service: Gastroenterology;  Laterality: N/A;  ? ESOPHAGOGASTRODUODENOSCOPY N/A 07/02/2018  ? Procedure: ESOPHAGOGASTRODUODENOSCOPY (EGD);  Surgeon: Lin Landsman, MD;  Location: [facility] ENDOSCOPY;  Service: Gastroenterology;  Laterality: N/A;  ? ESOPHAGOGASTRODUODENOSCOPY (EGD) WITH PROPOFOL N/A 06/01/2017  ? Procedure: ESOPHAGOGASTRODUODENOSCOPY (EGD) WITH PROPOFOL;  Surgeon: Jonathon Bellows, MD;  Location: [facility] ENDOSCOPY;  Service: Gastroenterology;  Laterality: N/A;  ? GIVENS CAPSULE STUDY N/A 10/17/2018  ? Procedure: GIVENS CAPSULE STUDY;  Surgeon: Lin Landsman, MD;  Location: [facility] ENDOSCOPY;  Service: Gastroenterology;  Laterality: N/A;  ? GIVENS CAPSULE STUDY N/A 08/02/2019  ? Procedure: GIVENS CAPSULE STUDY;  Surgeon: Lin Landsman, MD;  Location: [facility] ENDOSCOPY;  Service: Gastroenterology;  Laterality: N/A;  ? none    ? ? ?Social History  ? ?Socioeconomic History  ? Marital status: Divorced  ?  Spouse name: Not on file  ? Number of children: Not on file  ? Years of education: Not on file  ? Highest education level: Not on file  ?Occupational History  ? Occupation: on disability  ?Tobacco Use  ? Smoking status: Never  ? Smokeless tobacco: Never  ?Vaping Use  ? Vaping Use: Never used  ?Substance and Sexual Activity  ? Alcohol use: Yes  ?  Comment: on occasion  ? Drug use: No  ? Sexual activity: Yes  ?Other Topics Concern  ? Not on file  ?Social History Narrative  ? Not on file  ? ?Social Determinants of Health  ? ?Financial Resource Strain: Not on file  ?Food Insecurity: Not on file  ?Transportation Needs: Not on file  ?Physical Activity: Not on file  ?Stress: Not on file  ?Social Connections: Not on file  ?Intimate Partner Violence: Not on file  ? ? ?Family History  ?Problem Relation Age of Onset  ? Rheum arthritis Mother   ? Diabetes Father   ? Heart disease Father   ? Cancer Father   ? COPD Sister   ? Rheum arthritis Sister   ? Diabetes Maternal Grandmother    ? Cancer Maternal Grandfather   ? Diabetes Paternal Grandmother   ? Cancer Paternal Grandfather   ? ? ? ?Current Outpatient Medications:  ?  allopurinol (ZYLOPRIM) 300 MG tablet, Take 1 tablet (300 mg total) by mouth daily. needs appointment for further refills, Disp: 30 tablet, Rfl: 0 ?  amLODipine (NORVASC) 10 MG tablet, Take 1 tablet (10 mg total) by mouth daily., Disp: 90 tablet, Rfl: 1 ?  atorvastatin (LIPITOR) 40 MG tablet, TAKE 1 TABLET BY MOUTH ONCE DAILY, Disp: 100 tablet, Rfl: 1 ?  gabapentin (NEURONTIN) 300 MG capsule, Take 2 capsules (600 mg total) by mouth at bedtime., Disp: 60 capsule, Rfl: 2 ?  loratadine (CLARITIN) 10 MG tablet, Take 10 mg by mouth daily., Disp: , Rfl:  ?  losartan (COZAAR) 100 MG tablet, Take 1 tablet (100 mg total) by mouth daily., Disp: 90 tablet, Rfl: 1 ?  Magnesium Oxide 200 MG TABS, Take 1 tablet (200 mg total) by mouth 2 (two) times daily., Disp: 180 tablet, Rfl: 1 ?  metFORMIN (GLUCOPHAGE-XR) 500 MG 24 hr tablet, Take 1 tablet (500 mg total) by mouth daily., Disp: 90 tablet, Rfl: 1 ?  methylPREDNISolone (MEDROL) 4 MG tablet, Take 6 tablets on days 1 and 2, 5 tablets on days 3 and  4, 4 tablets on days 5 and 6, etc., until completed., Disp: , Rfl:  ?  Multiple Vitamin (MULTIVITAMIN ADULT PO), Gnp Century Mature Multivitamin Tab 125c, Disp: , Rfl:  ?  pantoprazole (PROTONIX) 40 MG tablet, Take 1 tablet (40 mg total) by mouth daily., Disp: 90 tablet, Rfl: 1 ?  propranolol ER (INDERAL LA) 60 MG 24 hr capsule, Take 1 capsule (60 mg total) by mouth daily. needs appointment for further refills, Disp: 90 capsule, Rfl: 1 ?  triamcinolone cream (KENALOG) 0.1 %, Apply 1 application topically 2 (two) times daily., Disp: 453.6 g, Rfl: 0 ?  TYLENOL 500 MG tablet, Take 1,000 mg by mouth every 8 (eight) hours as needed., Disp: , Rfl:  ?  VOLTAREN 1 % GEL, Apply 4 gram to affected area four times a day as needed for pain for knee pain. Do not exceed 16gm for LE joint per day, Disp: , Rfl:   ? ?Physical exam:  ?Vitals:  ? 05/06/21 1408  ?BP: 121/67  ?Pulse: 85  ?Resp: 16  ?Temp: 97.6 ?F (36.4 ?C)  ?TempSrc: Tympanic  ?SpO2: 98%  ?Weight: 228 lb 11.2 oz (103.7 kg)  ? ?Physical Exam ?Constitu

## 2021-05-06 NOTE — Progress Notes (Signed)
Patient here for follow up he is being treated for chronic hip pain, he denies chills and shortness of breath. ?

## 2021-05-07 ENCOUNTER — Other Ambulatory Visit: Payer: Self-pay | Admitting: Internal Medicine

## 2021-05-07 DIAGNOSIS — E119 Type 2 diabetes mellitus without complications: Secondary | ICD-10-CM

## 2021-05-07 DIAGNOSIS — G629 Polyneuropathy, unspecified: Secondary | ICD-10-CM

## 2021-05-08 NOTE — Telephone Encounter (Signed)
Requested Prescriptions  ?Pending Prescriptions Disp Refills  ?? allopurinol (ZYLOPRIM) 300 MG tablet [Pharmacy Med Name: ALLOPURINOL 300MG  TABLETS] 30 tablet 0  ?  Sig: TAKE 1 TABLET(300 MG) BY MOUTH DAILY  ?  ? Endocrinology:  Gout Agents - allopurinol Failed - 05/07/2021  3:39 AM  ?  ?  Failed - Cr in normal range and within 360 days  ?  Creatinine  ?Date Value Ref Range Status  ?09/19/2013 1.25 0.60 - 1.30 mg/dL Final  ? ?Creatinine, Ser  ?Date Value Ref Range Status  ?04/03/2021 1.45 (H) 0.76 - 1.27 mg/dL Final  ?   ?  ?  Passed - Uric Acid in normal range and within 360 days  ?  Uric Acid  ?Date Value Ref Range Status  ?07/19/2020 6.0 3.8 - 8.4 mg/dL Final  ?  Comment:  ?             Therapeutic target for gout patients: <6.0  ?   ?  ?  Passed - Valid encounter within last 12 months  ?  Recent Outpatient Visits   ?      ? 1 month ago Neuropathy  ? [facility] Vigg, Avanti, MD  ? 3 months ago Primary hypertension  ? [facility], Lauren A, NP  ? 9 months ago Prediabetes  ? [facility], NP  ?  ?  ?Future Appointments   ?        ? In 1 month Vigg, Avanti, MD [facility]  ?  ? ?  ?  ?  Passed - CBC within normal limits and completed in the last 12 months  ?  WBC  ?Date Value Ref Range Status  ?03/20/2021 4.2 4.0 - 10.5 K/uL Final  ? ?RBC  ?Date Value Ref Range Status  ?03/20/2021 3.65 (L) 4.22 - 5.81 MIL/uL Final  ? ?RBC.  ?Date Value Ref Range Status  ?03/20/2021 3.75 (L) 4.22 - 5.81 MIL/uL Final  ? ?Hemoglobin  ?Date Value Ref Range Status  ?03/20/2021 10.8 (L) 13.0 - 17.0 g/dL Final  ?03/22/2021 45/62/5638 (L) 13.0 - 17.7 g/dL Final  ? ?HCT  ?Date Value Ref Range Status  ?03/20/2021 33.6 (L) 39.0 - 52.0 % Final  ? ?Hematocrit  ?Date Value Ref Range Status  ?01/14/2021 31.6 (L) 37.5 - 51.0 % Final  ? ?MCHC  ?Date Value Ref Range Status  ?03/20/2021 32.1 30.0 - 36.0 g/dL Final  ? ?MCH  ?Date Value Ref Range Status  ?03/20/2021 29.6 26.0 -  34.0 pg Final  ? ?MCV  ?Date Value Ref Range Status  ?03/20/2021 92.1 80.0 - 100.0 fL Final  ?01/14/2021 88 79 - 97 fL Final  ?09/19/2013 91 80 - 100 fL Final  ? ?No results found for: PLTCOUNTKUC, LABPLAT, POCPLA ?RDW  ?Date Value Ref Range Status  ?03/20/2021 14.8 11.5 - 15.5 % Final  ?01/14/2021 14.3 11.6 - 15.4 % Final  ?09/19/2013 15.5 (H) 11.5 - 14.5 % Final  ? ?  ?  ?  ? ? ?

## 2021-05-17 DIAGNOSIS — R200 Anesthesia of skin: Secondary | ICD-10-CM | POA: Insufficient documentation

## 2021-05-21 DIAGNOSIS — M549 Dorsalgia, unspecified: Secondary | ICD-10-CM | POA: Diagnosis not present

## 2021-05-21 DIAGNOSIS — M79609 Pain in unspecified limb: Secondary | ICD-10-CM | POA: Diagnosis not present

## 2021-05-22 ENCOUNTER — Inpatient Hospital Stay: Payer: Medicare Other

## 2021-05-23 ENCOUNTER — Inpatient Hospital Stay: Payer: Medicare Other

## 2021-05-23 DIAGNOSIS — D649 Anemia, unspecified: Secondary | ICD-10-CM | POA: Diagnosis not present

## 2021-05-23 DIAGNOSIS — N62 Hypertrophy of breast: Secondary | ICD-10-CM | POA: Diagnosis not present

## 2021-05-23 DIAGNOSIS — M2569 Stiffness of other specified joint, not elsewhere classified: Secondary | ICD-10-CM | POA: Diagnosis not present

## 2021-05-23 DIAGNOSIS — R7989 Other specified abnormal findings of blood chemistry: Secondary | ICD-10-CM | POA: Diagnosis not present

## 2021-05-23 DIAGNOSIS — E538 Deficiency of other specified B group vitamins: Secondary | ICD-10-CM | POA: Diagnosis not present

## 2021-05-23 MED ORDER — CYANOCOBALAMIN 1000 MCG/ML IJ SOLN
1000.0000 ug | INTRAMUSCULAR | Status: DC
  Administered 2021-05-23: 1000 ug via INTRAMUSCULAR
  Filled 2021-05-23: qty 1

## 2021-05-27 DIAGNOSIS — M2569 Stiffness of other specified joint, not elsewhere classified: Secondary | ICD-10-CM | POA: Diagnosis not present

## 2021-05-29 DIAGNOSIS — M2569 Stiffness of other specified joint, not elsewhere classified: Secondary | ICD-10-CM | POA: Diagnosis not present

## 2021-06-03 DIAGNOSIS — M2569 Stiffness of other specified joint, not elsewhere classified: Secondary | ICD-10-CM | POA: Diagnosis not present

## 2021-06-13 DIAGNOSIS — M2569 Stiffness of other specified joint, not elsewhere classified: Secondary | ICD-10-CM | POA: Diagnosis not present

## 2021-06-19 ENCOUNTER — Inpatient Hospital Stay: Payer: Medicare Other | Attending: Oncology

## 2021-07-02 ENCOUNTER — Ambulatory Visit: Payer: Medicaid Other | Admitting: Internal Medicine

## 2021-07-09 ENCOUNTER — Encounter: Payer: Self-pay | Admitting: Internal Medicine

## 2021-07-09 ENCOUNTER — Ambulatory Visit (INDEPENDENT_AMBULATORY_CARE_PROVIDER_SITE_OTHER): Payer: Medicare Other | Admitting: Internal Medicine

## 2021-07-09 VITALS — BP 108/70 | HR 71 | Temp 98.5°F | Ht 70.98 in | Wt 229.0 lb

## 2021-07-09 DIAGNOSIS — E782 Mixed hyperlipidemia: Secondary | ICD-10-CM

## 2021-07-09 DIAGNOSIS — E1129 Type 2 diabetes mellitus with other diabetic kidney complication: Secondary | ICD-10-CM | POA: Insufficient documentation

## 2021-07-09 DIAGNOSIS — E1169 Type 2 diabetes mellitus with other specified complication: Secondary | ICD-10-CM | POA: Insufficient documentation

## 2021-07-09 DIAGNOSIS — G629 Polyneuropathy, unspecified: Secondary | ICD-10-CM

## 2021-07-09 DIAGNOSIS — E119 Type 2 diabetes mellitus without complications: Secondary | ICD-10-CM | POA: Insufficient documentation

## 2021-07-09 LAB — URINALYSIS, ROUTINE W REFLEX MICROSCOPIC
Bilirubin, UA: NEGATIVE
Glucose, UA: NEGATIVE
Ketones, UA: NEGATIVE
Leukocytes,UA: NEGATIVE
Nitrite, UA: NEGATIVE
Protein,UA: NEGATIVE
RBC, UA: NEGATIVE
Specific Gravity, UA: 1.015 (ref 1.005–1.030)
Urobilinogen, Ur: 0.2 mg/dL (ref 0.2–1.0)
pH, UA: 6.5 (ref 5.0–7.5)

## 2021-07-09 LAB — BAYER DCA HB A1C WAIVED: HB A1C (BAYER DCA - WAIVED): 5.8 % — ABNORMAL HIGH (ref 4.8–5.6)

## 2021-07-09 NOTE — Progress Notes (Signed)
BP 108/70   Pulse 71   Temp 98.5 F (36.9 C) (Oral)   Ht 5' 10.98" (1.803 m)   Wt 229 lb (103.9 kg)   SpO2 95%   BMI 31.95 kg/m    Subjective:    Patient ID: George Gomez, male    DOB: 08-Jun-1960, 61 y.o.   MRN: [id number]  Chief Complaint  Patient presents with  . Back Pain    Back is still painful and is finding it hard to stand for long period of time.  . Diabetes    HPI: George Gomez is a 61 y.o. male  Back Pain This is a chronic problem. Pertinent negatives include no chest pain, weakness or weight loss.  Diabetes He presents for his follow-up diabetic visit. He has type 2 diabetes mellitus. Pertinent negatives for diabetes include no blurred vision, no chest pain, no fatigue, no foot paresthesias, no foot ulcerations, no polydipsia, no polyphagia, no polyuria, no visual change, no weakness and no weight loss.    Chief Complaint  Patient presents with  . Back Pain    Back is still painful and is finding it hard to stand for long period of time.  . Diabetes    Relevant past medical, surgical, family and social history reviewed and updated as indicated. Interim medical history since our last visit reviewed. Allergies and medications reviewed and updated.  Review of Systems  Constitutional:  Negative for fatigue and weight loss.  Eyes:  Negative for blurred vision.  Cardiovascular:  Negative for chest pain.  Endocrine: Negative for polydipsia, polyphagia and polyuria.  Musculoskeletal:  Positive for back pain.  Neurological:  Negative for weakness.    Per HPI unless specifically indicated above     Objective:    BP 108/70   Pulse 71   Temp 98.5 F (36.9 C) (Oral)   Ht 5' 10.98" (1.803 m)   Wt 229 lb (103.9 kg)   SpO2 95%   BMI 31.95 kg/m   Wt Readings from Last 3 Encounters:  07/09/21 229 lb (103.9 kg)  05/06/21 228 lb 11.2 oz (103.7 kg)  04/03/21 229 lb (103.9 kg)    Physical Exam Vitals and nursing note reviewed.  Constitutional:       General: He is not in acute distress.    Appearance: Normal appearance. He is not ill-appearing or diaphoretic.  HENT:     Head: Normocephalic and atraumatic.     Right Ear: Tympanic membrane and external ear normal. There is no impacted cerumen.     Left Ear: External ear normal.     Nose: No congestion or rhinorrhea.     Mouth/Throat:     Pharynx: No oropharyngeal exudate or posterior oropharyngeal erythema.  Eyes:     Conjunctiva/sclera: Conjunctivae normal.     Pupils: Pupils are equal, round, and reactive to light.  Cardiovascular:     Rate and Rhythm: Normal rate and regular rhythm.     Heart sounds: No murmur heard.    No friction rub. No gallop.  Pulmonary:     Effort: No respiratory distress.     Breath sounds: No stridor. No wheezing or rhonchi.  Chest:     Chest wall: No tenderness.  Abdominal:     General: Abdomen is flat. Bowel sounds are normal.     Palpations: Abdomen is soft.     Tenderness: There is no abdominal tenderness.  Musculoskeletal:     Cervical back: Normal range of motion and neck supple. No rigidity  or tenderness.     Left lower leg: No edema.  Skin:    General: Skin is warm.  Neurological:     Mental Status: He is alert.    Results for orders placed or performed in visit on 07/09/21  Bayer DCA Hb A1c Waived  Result Value Ref Range   HB A1C (BAYER DCA - WAIVED) 5.8 (H) 4.8 - 5.6 %  Urinalysis, Routine w reflex microscopic  Result Value Ref Range   Specific Gravity, UA 1.015 1.005 - 1.030   pH, UA 6.5 5.0 - 7.5   Color, UA Yellow Yellow   Appearance Ur Clear Clear   Leukocytes,UA Negative Negative   Protein,UA Negative Negative/Trace   Glucose, UA Negative Negative   Ketones, UA Negative Negative   RBC, UA Negative Negative   Bilirubin, UA Negative Negative   Urobilinogen, Ur 0.2 0.2 - 1.0 mg/dL   Nitrite, UA Negative Negative        Current Outpatient Medications:  .  allopurinol (ZYLOPRIM) 300 MG tablet, TAKE 1 TABLET(300 MG) BY  MOUTH DAILY, Disp: 30 tablet, Rfl: 2 .  amLODipine (NORVASC) 10 MG tablet, Take 1 tablet (10 mg total) by mouth daily., Disp: 90 tablet, Rfl: 1 .  atorvastatin (LIPITOR) 40 MG tablet, TAKE 1 TABLET BY MOUTH ONCE DAILY, Disp: 100 tablet, Rfl: 1 .  loratadine (CLARITIN) 10 MG tablet, Take 10 mg by mouth daily., Disp: , Rfl:  .  losartan (COZAAR) 100 MG tablet, Take 1 tablet (100 mg total) by mouth daily., Disp: 90 tablet, Rfl: 1 .  Magnesium Oxide 200 MG TABS, Take 1 tablet (200 mg total) by mouth 2 (two) times daily., Disp: 180 tablet, Rfl: 1 .  metFORMIN (GLUCOPHAGE-XR) 500 MG 24 hr tablet, Take 1 tablet (500 mg total) by mouth daily., Disp: 90 tablet, Rfl: 1 .  Multiple Vitamin (MULTIVITAMIN ADULT PO), Gnp Century Mature Multivitamin Tab 125c, Disp: , Rfl:  .  pantoprazole (PROTONIX) 40 MG tablet, Take 1 tablet (40 mg total) by mouth daily., Disp: 90 tablet, Rfl: 1 .  propranolol ER (INDERAL LA) 60 MG 24 hr capsule, Take 1 capsule (60 mg total) by mouth daily. needs appointment for further refills, Disp: 90 capsule, Rfl: 1 .  triamcinolone cream (KENALOG) 0.1 %, Apply 1 application topically 2 (two) times daily., Disp: 453.6 g, Rfl: 0 .  TYLENOL 500 MG tablet, Take 1,000 mg by mouth every 8 (eight) hours as needed., Disp: , Rfl:  .  VOLTAREN 1 % GEL, Apply 4 gram to affected area four times a day as needed for pain for knee pain. Do not exceed 16gm for LE joint per day, Disp: , Rfl:  .  gabapentin (NEURONTIN) 300 MG capsule, Take 2 capsules (600 mg total) by mouth at bedtime., Disp: 60 capsule, Rfl: 2    Assessment & Plan:  DM is on metformin for such a1c at 6.1 check HbA1c,  urine  microalbumin  diabetic diet plan given to pt  adviced regarding hypoglycemia and instructions given to pt today on how to prevent and treat the same if it were to occur. pt acknowledges the plan and voices understanding of the same.  exercise plan given and encouraged.   advice diabetic yearly podiatry, ophthalmology ,  nutritionist , dental check q 6 months,     HTN  Continue current meds.  Medication compliance emphasised. pt advised to keep Bp logs. Pt verbalised understanding of the same. Pt to have a low salt diet . Exercise to  reach a goal of at least 150 mins a week.  lifestyle modifications explained and pt understands importance of the above. Under good control on current regimen. Continue current regimen. Continue to monitor. Call with any concerns. Refills given. Labs drawn today.   HLD is on iptro 40 mg recheck FLP, check LFT's work on diet, SE of meds explained to pt. low fat and high fiber diet explained to pt.     Neuropathy  sec to B12 def causing neuropathy vs etoh abuse. Referred to see neurology  Will need to continue b12 shots mised them  septemeber    Problem List Items Addressed This Visit       Nervous and Auditory   Neuropathy - Primary   Relevant Orders   TSH   CBC with Differential/Platelet   Bayer DCA Hb A1c Waived (Completed)   Lipid panel   Basic metabolic panel   Urinalysis, Routine w reflex microscopic (Completed)     Other   Mixed hyperlipidemia   Other Visit Diagnoses     Diabetes mellitus without complication ([place])       Relevant Orders   TSH   CBC with Differential/Platelet   Bayer DCA Hb A1c Waived (Completed)   Lipid panel   Basic metabolic panel   Urinalysis, Routine w reflex microscopic (Completed)        Orders Placed This Encounter  Procedures  . TSH  . CBC with Differential/Platelet  . Bayer DCA Hb A1c Waived  . Lipid panel  . Basic metabolic panel  . Urinalysis, Routine w reflex microscopic     No orders of the defined types were placed in this encounter.    Follow up plan: Return in about 3 months (around 10/09/2021).

## 2021-07-10 LAB — BASIC METABOLIC PANEL
BUN/Creatinine Ratio: 13 (ref 10–24)
BUN: 16 mg/dL (ref 8–27)
CO2: 23 mmol/L (ref 20–29)
Calcium: 9.5 mg/dL (ref 8.6–10.2)
Chloride: 100 mmol/L (ref 96–106)
Creatinine, Ser: 1.2 mg/dL (ref 0.76–1.27)
Glucose: 126 mg/dL — ABNORMAL HIGH (ref 70–99)
Potassium: 3.5 mmol/L (ref 3.5–5.2)
Sodium: 139 mmol/L (ref 134–144)
eGFR: 68 mL/min/{1.73_m2} (ref >59)

## 2021-07-10 LAB — CBC WITH DIFFERENTIAL/PLATELET
Basophils Absolute: 0 10*3/uL (ref 0.0–0.2)
Basos: 1 %
EOS (ABSOLUTE): 0.1 10*3/uL (ref 0.0–0.4)
Eos: 3 %
Hematocrit: 31 % — ABNORMAL LOW (ref 37.5–51.0)
Hemoglobin: 10.4 g/dL — ABNORMAL LOW (ref 13.0–17.7)
Immature Grans (Abs): 0 10*3/uL (ref 0.0–0.1)
Immature Granulocytes: 1 %
Lymphocytes Absolute: 0.7 10*3/uL (ref 0.7–3.1)
Lymphs: 25 %
MCH: 29.8 pg (ref 26.6–33.0)
MCHC: 33.5 g/dL (ref 31.5–35.7)
MCV: 89 fL (ref 79–97)
Monocytes Absolute: 0.5 10*3/uL (ref 0.1–0.9)
Monocytes: 18 %
Neutrophils Absolute: 1.6 10*3/uL (ref 1.4–7.0)
Neutrophils: 52 %
Platelets: 156 10*3/uL (ref 150–450)
RBC: 3.49 x10E6/uL — ABNORMAL LOW (ref 4.14–5.80)
RDW: 15.5 % — ABNORMAL HIGH (ref 11.6–15.4)
WBC: 3 10*3/uL — ABNORMAL LOW (ref 3.4–10.8)

## 2021-07-10 LAB — LIPID PANEL
Chol/HDL Ratio: 2.3 ratio (ref 0.0–5.0)
Cholesterol, Total: 148 mg/dL (ref 100–199)
HDL: 65 mg/dL (ref >39)
LDL Chol Calc (NIH): 63 mg/dL (ref 0–99)
Triglycerides: 116 mg/dL (ref 0–149)
VLDL Cholesterol Cal: 20 mg/dL (ref 5–40)

## 2021-07-10 LAB — TSH: TSH: 2.3 u[IU]/mL (ref 0.450–4.500)

## 2021-07-24 ENCOUNTER — Other Ambulatory Visit: Payer: Self-pay

## 2021-07-24 ENCOUNTER — Inpatient Hospital Stay: Payer: Medicare Other | Attending: Oncology

## 2021-07-24 DIAGNOSIS — E538 Deficiency of other specified B group vitamins: Secondary | ICD-10-CM | POA: Insufficient documentation

## 2021-07-24 DIAGNOSIS — D649 Anemia, unspecified: Secondary | ICD-10-CM | POA: Diagnosis not present

## 2021-07-24 DIAGNOSIS — G629 Polyneuropathy, unspecified: Secondary | ICD-10-CM

## 2021-07-24 DIAGNOSIS — E119 Type 2 diabetes mellitus without complications: Secondary | ICD-10-CM

## 2021-07-24 MED ORDER — GABAPENTIN 300 MG PO CAPS: 600.0000 mg | ORAL_CAPSULE | Freq: Every day | ORAL | 0 refills | Status: DC

## 2021-07-24 MED ORDER — CYANOCOBALAMIN 1000 MCG/ML IJ SOLN
1000.0000 ug | INTRAMUSCULAR | Status: DC
  Administered 2021-07-24: 1000 ug via INTRAMUSCULAR
  Filled 2021-07-24: qty 1

## 2021-07-29 ENCOUNTER — Other Ambulatory Visit: Payer: Self-pay

## 2021-07-29 DIAGNOSIS — G629 Polyneuropathy, unspecified: Secondary | ICD-10-CM

## 2021-07-29 DIAGNOSIS — E119 Type 2 diabetes mellitus without complications: Secondary | ICD-10-CM

## 2021-07-29 MED ORDER — ALLOPURINOL 300 MG PO TABS: ORAL_TABLET | ORAL | 0 refills | Status: DC

## 2021-07-29 MED ORDER — LOSARTAN POTASSIUM 100 MG PO TABS
100.0000 mg | ORAL_TABLET | Freq: Every day | ORAL | 0 refills | Status: DC
Start: 2021-07-29 — End: 2022-04-10

## 2021-07-29 NOTE — Telephone Encounter (Signed)
Last ov 07/09/21 No up coming visit in future

## 2021-08-05 ENCOUNTER — Ambulatory Visit: Admission: RE | Admit: 2021-08-05 | Payer: Medicare Other | Source: Ambulatory Visit

## 2021-08-21 ENCOUNTER — Emergency Department
Admission: EM | Admit: 2021-08-21 | Discharge: 2021-08-21 | Disposition: A | Discharge status: Home / Self Care | Payer: Medicare Other | Attending: Emergency Medicine | Admitting: Emergency Medicine

## 2021-08-21 ENCOUNTER — Emergency Department: Payer: Medicare Other

## 2021-08-21 ENCOUNTER — Inpatient Hospital Stay: Payer: Medicare Other | Attending: Oncology

## 2021-08-21 ENCOUNTER — Other Ambulatory Visit: Payer: Self-pay

## 2021-08-21 ENCOUNTER — Inpatient Hospital Stay: Payer: Medicare Other

## 2021-08-21 DIAGNOSIS — I129 Hypertensive chronic kidney disease with stage 1 through stage 4 chronic kidney disease, or unspecified chronic kidney disease: Secondary | ICD-10-CM | POA: Diagnosis not present

## 2021-08-21 DIAGNOSIS — E119 Type 2 diabetes mellitus without complications: Secondary | ICD-10-CM | POA: Insufficient documentation

## 2021-08-21 DIAGNOSIS — R109 Unspecified abdominal pain: Secondary | ICD-10-CM | POA: Diagnosis not present

## 2021-08-21 DIAGNOSIS — R112 Nausea with vomiting, unspecified: Secondary | ICD-10-CM | POA: Diagnosis not present

## 2021-08-21 DIAGNOSIS — R197 Diarrhea, unspecified: Secondary | ICD-10-CM | POA: Insufficient documentation

## 2021-08-21 DIAGNOSIS — N189 Chronic kidney disease, unspecified: Secondary | ICD-10-CM | POA: Insufficient documentation

## 2021-08-21 DIAGNOSIS — K5730 Diverticulosis of large intestine without perforation or abscess without bleeding: Secondary | ICD-10-CM | POA: Diagnosis not present

## 2021-08-21 DIAGNOSIS — E860 Dehydration: Secondary | ICD-10-CM | POA: Insufficient documentation

## 2021-08-21 DIAGNOSIS — K760 Fatty (change of) liver, not elsewhere classified: Secondary | ICD-10-CM | POA: Diagnosis not present

## 2021-08-21 LAB — CBC
HCT: 29.2 % — ABNORMAL LOW (ref 39.0–52.0)
Hemoglobin: 9.5 g/dL — ABNORMAL LOW (ref 13.0–17.0)
MCH: 28.7 pg (ref 26.0–34.0)
MCHC: 32.5 g/dL (ref 30.0–36.0)
MCV: 88.2 fL (ref 80.0–100.0)
Platelets: 175 10*3/uL (ref 150–400)
RBC: 3.31 MIL/uL — ABNORMAL LOW (ref 4.22–5.81)
RDW: 15 % (ref 11.5–15.5)
WBC: 3.6 10*3/uL — ABNORMAL LOW (ref 4.0–10.5)
nRBC: 0 % (ref 0.0–0.2)

## 2021-08-21 LAB — COMPREHENSIVE METABOLIC PANEL
ALT: 27 U/L (ref 0–44)
AST: 43 U/L — ABNORMAL HIGH (ref 15–41)
Albumin: 3.8 g/dL (ref 3.5–5.0)
Alkaline Phosphatase: 88 U/L (ref 38–126)
Anion gap: 14 (ref 5–15)
BUN: 27 mg/dL — ABNORMAL HIGH (ref 8–23)
CO2: 21 mmol/L — ABNORMAL LOW (ref 22–32)
Calcium: 8.8 mg/dL — ABNORMAL LOW (ref 8.9–10.3)
Chloride: 99 mmol/L (ref 98–111)
Creatinine, Ser: 3.23 mg/dL — ABNORMAL HIGH (ref 0.61–1.24)
GFR, Estimated: 21 mL/min — ABNORMAL LOW (ref >60)
Glucose, Bld: 131 mg/dL — ABNORMAL HIGH (ref 70–99)
Potassium: 3.5 mmol/L (ref 3.5–5.1)
Sodium: 134 mmol/L — ABNORMAL LOW (ref 135–145)
Total Bilirubin: 1.6 mg/dL — ABNORMAL HIGH (ref 0.3–1.2)
Total Protein: 7.8 g/dL (ref 6.5–8.1)

## 2021-08-21 LAB — URINALYSIS, ROUTINE W REFLEX MICROSCOPIC
Bilirubin Urine: NEGATIVE
Glucose, UA: NEGATIVE mg/dL
Hgb urine dipstick: NEGATIVE
Ketones, ur: 5 mg/dL — AB
Leukocytes,Ua: NEGATIVE
Nitrite: NEGATIVE
Protein, ur: 30 mg/dL — AB
Specific Gravity, Urine: 1.013 (ref 1.005–1.030)
pH: 5 (ref 5.0–8.0)

## 2021-08-21 LAB — LIPASE, BLOOD: Lipase: 49 U/L (ref 11–51)

## 2021-08-21 MED ORDER — SODIUM CHLORIDE 0.9 % IV BOLUS
1000.0000 mL | Freq: Once | INTRAVENOUS | Status: AC
  Administered 2021-08-21: 1000 mL via INTRAVENOUS

## 2021-08-21 MED ORDER — ONDANSETRON HCL 4 MG/2ML IJ SOLN
4.0000 mg | Freq: Once | INTRAMUSCULAR | Status: AC
  Administered 2021-08-21: 4 mg via INTRAVENOUS
  Filled 2021-08-21: qty 2

## 2021-08-21 MED ORDER — ONDANSETRON 4 MG PO TBDP: 4.0000 mg | ORAL_TABLET | Freq: Three times a day (TID) | ORAL | 0 refills | Status: DC | PRN

## 2021-08-21 NOTE — ED Provider Notes (Signed)
[facility] ([place]) Provider Note    Event Date/Time   First MD Initiated Contact with Patient 08/21/21 1151     (approximate)   History   Abdominal Pain   HPI  George Gomez is a 61 y.o. male with history of anemia, hypertension, diabetes, remaining history as listed below presents to the emergency department for treatment and evaluation of abdominal pain for the past week.  He is also had nausea, vomiting, and diarrhea.  Patient denies fever. . Past Medical History:  Diagnosis Date   Acute on chronic renal failure (HCC) 03/20/2016   Acute renal failure (ARF) (HCC) 08/28/2016   Acute renal failure (HCC) 10/12/2018   Anemia    B12 deficiency 12/11/2017   Clostridium difficile diarrhea 03/18/2016   Diabetes mellitus without complication (HCC)    Diverticulitis    Pt states diverticulitis   EtOH dependence (HCC)    Folate deficiency 07/25/2018   GI bleed 05/30/2017   Hypercholesteremia    Hypertension    Melena 10/21/2018   Weight loss 09/15/2018     Physical Exam   Triage Vital Signs: ED Triage Vitals  Enc Vitals Group     BP 08/21/21 1117 99/69     Pulse Rate 08/21/21 1117 91     Resp 08/21/21 1117 18     Temp 08/21/21 1117 98.7 F (37.1 C)     Temp Source 08/21/21 1117 Oral     SpO2 08/21/21 1117 97 %     Weight 08/21/21 1116 207 lb (93.9 kg)     Height 08/21/21 1116 5\' 11"  (1.803 m)     Head Circumference --      Peak Flow --      Pain Score 08/21/21 1116 6     Pain Loc --      Pain Edu? --      Excl. in GC? --     Most recent vital signs: Vitals:   08/21/21 1348 08/21/21 1504  BP: 120/72 125/87  Pulse: 92 78  Resp: 17 16  Temp:    SpO2: 98% 96%    General: Awake, no distress.  CV:  Good peripheral perfusion.  Resp:  Normal effort.  Abd:  Mildly distended, soft, bowel sounds present x4 quadrants.  No focal tenderness. Other:     ED Results / Procedures / Treatments   Labs (all labs ordered are listed, but only abnormal results  are displayed) Labs Reviewed  COMPREHENSIVE METABOLIC PANEL - Abnormal; Notable for the following components:      Result Value   Sodium 134 (*)    CO2 21 (*)    Glucose, Bld 131 (*)    BUN 27 (*)    Creatinine, Ser 3.23 (*)    Calcium 8.8 (*)    AST 43 (*)    Total Bilirubin 1.6 (*)    GFR, Estimated 21 (*)    All other components within normal limits  CBC - Abnormal; Notable for the following components:   WBC 3.6 (*)    RBC 3.31 (*)    Hemoglobin 9.5 (*)    HCT 29.2 (*)    All other components within normal limits  URINALYSIS, ROUTINE W REFLEX MICROSCOPIC - Abnormal; Notable for the following components:   Color, Urine YELLOW (*)    APPearance HAZY (*)    Ketones, ur 5 (*)    Protein, ur 30 (*)    Bacteria, UA RARE (*)    All other components within normal limits  LIPASE, BLOOD     EKG  Not indicated   RADIOLOGY  CT abdomen and pelvis negative for acute findings.   I have independently reviewed and interpreted imaging as well as reviewed report from radiology.  PROCEDURES:  Critical Care performed: No  Procedures   MEDICATIONS ORDERED IN ED:  Medications  ondansetron (ZOFRAN) injection 4 mg (4 mg Intravenous Given 08/21/21 1306)  sodium chloride 0.9 % bolus 1,000 mL (0 mLs Intravenous Stopped 08/21/21 1536)     IMPRESSION / MDM / ASSESSMENT AND PLAN / ED COURSE   I reviewed the triage vital signs and the nursing notes.  Differential diagnosis includes, but is not limited to:   Patient's presentation is most consistent with acute presentation with potential threat to life or bodily function.  61 year old male presents to the ER for abdominal pain with nausea, vomiting, and diarrhea.   Labs concerning for dehydration with a creatinine of 3.23 and BUN of 27. IV fluids ordered as well as CT abdomen and pelvis without contrast.   CT negative for acute concerns.   CBC shows stable, chronic anemia.  After fluids and zofran, patient tolerating fluids  and crackers. We discussed admission for small AKI and further monitoring, patient would like to go home now that he is able to eat and drink. Strict ER return precautions discussed.      FINAL CLINICAL IMPRESSION(S) / ED DIAGNOSES   Final diagnoses:  Dehydration, mild  Nausea vomiting and diarrhea     Rx / DC Orders   ED Discharge Orders          Ordered    ondansetron (ZOFRAN-ODT) 4 MG disintegrating tablet  Every 8 hours PRN        08/21/21 1533             Note:  This document was prepared using Dragon voice recognition software and may include unintentional dictation errors.   Chinita Pester, FNP 08/23/21 0941    Sharman Cheek, MD 08/27/21 [phone]

## 2021-08-21 NOTE — ED Triage Notes (Signed)
Pt here with abd pain x1 week. Pt endorses N/V/D. Pt states pain is in the upper abd region. Pt ambulatory to triage.

## 2021-08-21 NOTE — Discharge Instructions (Addendum)
Please call and schedule a follow up with your primary care provider.  Increase fluid intake.  Return to the ER for symptoms of concern.

## 2021-08-22 ENCOUNTER — Telehealth: Payer: Self-pay | Admitting: *Deleted

## 2021-08-22 NOTE — Telephone Encounter (Signed)
Transition Care Management Unsuccessful Follow-up Telephone Call  Date of discharge and from where:  [facility] 08-21-2021  Attempts:  1st Attempt  Reason for unsuccessful TCM follow-up call:  Left voice message

## 2021-08-26 NOTE — Telephone Encounter (Signed)
Transition Care Management Unsuccessful Follow-up Telephone Call  Date of discharge and from where:  [facility] 08-21-2021  Attempts:  2nd Attempt  Reason for unsuccessful TCM follow-up call:  Left voice message  IF PATIENT DOES CALL BACK  PLEASE SCHEDULE A HOSPITAL FOLLOW UP

## 2021-08-27 ENCOUNTER — Telehealth: Payer: Self-pay | Admitting: *Deleted

## 2021-08-27 NOTE — Telephone Encounter (Signed)
Transition Care Management Follow-up Telephone Call Date of discharge and from where: [facility] 08-21-2021 How have you been since you were released from the hospital? ok Any questions or concerns? No  Items Reviewed: Did the pt receive and understand the discharge instructions provided? Yes  Medications obtained and verified?  Other?  Any new allergies since your discharge? No  Dietary orders reviewed? No Do you have support at home? Yes   Home Care and Equipment/Supplies: Were home health services ordered?  If so, what is the name of the agency?   Has the agency set up a time to come to the patient's home?  Were any new equipment or medical supplies ordered?   What is the name of the medical supply agency?  Were you able to get the supplies/equipment?  Do you have any questions related to the use of the equipment or supplies?   Functional Questionnaire: (I = Independent and D = Dependent) ADLs:   Bathing/Dressing- I  Meal Prep- I  Eating- I  Maintaining continence- I  Transferring/Ambulation- I  Managing Meds- I  Follow up appointments reviewed:  PCP Hospital f/u appt confirmed? Yes  Scheduled to see 08-28-2021 on 9:20 @ [facility] f/u appt confirmed? No  . Are transportation arrangements needed? No  If their condition worsens, is the pt aware to call PCP or go to the Emergency Dept.?  yes Was the patient provided with contact information for the PCP's office or ED? yes Was to pt encouraged to call back with questions or concerns? Yes

## 2021-08-28 ENCOUNTER — Inpatient Hospital Stay: Payer: Medicare Other | Admitting: Physician Assistant

## 2021-08-28 NOTE — Progress Notes (Deleted)
Established Patient Office Visit  Name: George Gomez   MRN: [id number]    DOB: 1960-11-20   Date:08/28/2021  Today's Provider: Talitha Givens, MHS, PA-C Introduced myself to the patient as a PA-C and provided education on APPs in clinical practice.         Subjective  Chief Complaint  No chief complaint on file.   HPI  Emergency room follow up for dehydration and nausea Diabetes is in goal as of 07/09/21 Will need to recheck CMP, cbc as there were derangements in the ED    Patient Active Problem List   Diagnosis Date Noted   Diabetes mellitus without complication ([place]) 21/62/4469   Obesity (BMI 30-39.9) 01/14/2021   Prediabetes 07/19/2020   Mixed hyperlipidemia 07/19/2020   Primary hypertension 07/19/2020   Chronic gout without tophus 07/19/2020   Gastroesophageal reflux disease 07/19/2020   Neuropathy 07/19/2020   Eczema 07/19/2020   Elevated LFTs 08/23/2019   Abnormal iron saturation 03/22/2019   Anemia of chronic disease 10/14/2018   B12 deficiency 12/11/2017   Normocytic anemia 12/03/2017   Thrombocytopenia ([place]) 12/03/2017   Alcohol abuse 03/20/2016    Past Surgical History:  Procedure Laterality Date   COLONOSCOPY WITH PROPOFOL N/A 10/16/2018   Procedure: COLONOSCOPY WITH PROPOFOL;  Surgeon: Lin Landsman, MD;  Location: [place];  Service: Gastroenterology;  Laterality: N/A;   ENTEROSCOPY N/A 10/16/2018   Procedure: ENTEROSCOPY;  Surgeon: Lin Landsman, MD;  Location: [facility] ENDOSCOPY;  Service: Gastroenterology;  Laterality: N/A;   ESOPHAGOGASTRODUODENOSCOPY N/A 07/02/2018   Procedure: ESOPHAGOGASTRODUODENOSCOPY (EGD);  Surgeon: Lin Landsman, MD;  Location: [facility] ENDOSCOPY;  Service: Gastroenterology;  Laterality: N/A;   ESOPHAGOGASTRODUODENOSCOPY (EGD) WITH PROPOFOL N/A 06/01/2017   Procedure: ESOPHAGOGASTRODUODENOSCOPY (EGD) WITH PROPOFOL;  Surgeon: Jonathon Bellows, MD;  Location: [facility] ENDOSCOPY;  Service: Gastroenterology;  Laterality:  N/A;   GIVENS CAPSULE STUDY N/A 10/17/2018   Procedure: GIVENS CAPSULE STUDY;  Surgeon: Lin Landsman, MD;  Location: [facility] ENDOSCOPY;  Service: Gastroenterology;  Laterality: N/A;   GIVENS CAPSULE STUDY N/A 08/02/2019   Procedure: GIVENS CAPSULE STUDY;  Surgeon: Lin Landsman, MD;  Location: [facility] ENDOSCOPY;  Service: Gastroenterology;  Laterality: N/A;   none      Family History  Problem Relation Age of Onset   Rheum arthritis Mother    Diabetes Father    Heart disease Father    Cancer Father    COPD Sister    Rheum arthritis Sister    Diabetes Maternal Grandmother    Cancer Maternal Grandfather    Diabetes Paternal Grandmother    Cancer Paternal Grandfather     Social History   Tobacco Use   Smoking status: Never   Smokeless tobacco: Never  Substance Use Topics   Alcohol use: Yes    Comment: on occasion     Current Outpatient Medications:    allopurinol (ZYLOPRIM) 300 MG tablet, TAKE 1 TABLET(300 MG) BY MOUTH DAILY, Disp: 90 tablet, Rfl: 0   amLODipine (NORVASC) 10 MG tablet, Take 1 tablet (10 mg total) by mouth daily., Disp: 90 tablet, Rfl: 1   atorvastatin (LIPITOR) 40 MG tablet, TAKE 1 TABLET BY MOUTH ONCE DAILY, Disp: 100 tablet, Rfl: 1   gabapentin (NEURONTIN) 300 MG capsule, Take 2 capsules (600 mg total) by mouth at bedtime., Disp: 180 capsule, Rfl: 0   loratadine (CLARITIN) 10 MG tablet, Take 10 mg by mouth daily., Disp: , Rfl:    losartan (COZAAR) 100 MG tablet, Take  1 tablet (100 mg total) by mouth daily., Disp: 90 tablet, Rfl: 0   Magnesium Oxide 200 MG TABS, Take 1 tablet (200 mg total) by mouth 2 (two) times daily., Disp: 180 tablet, Rfl: 1   metFORMIN (GLUCOPHAGE-XR) 500 MG 24 hr tablet, Take 1 tablet (500 mg total) by mouth daily., Disp: 90 tablet, Rfl: 1   Multiple Vitamin (MULTIVITAMIN ADULT PO), Gnp Century Mature Multivitamin Tab 125c, Disp: , Rfl:    ondansetron (ZOFRAN-ODT) 4 MG disintegrating tablet, Take 1 tablet (4 mg total) by mouth every  8 (eight) hours as needed for nausea or vomiting., Disp: 20 tablet, Rfl: 0   pantoprazole (PROTONIX) 40 MG tablet, Take 1 tablet (40 mg total) by mouth daily., Disp: 90 tablet, Rfl: 1   propranolol ER (INDERAL LA) 60 MG 24 hr capsule, Take 1 capsule (60 mg total) by mouth daily. needs appointment for further refills, Disp: 90 capsule, Rfl: 1   triamcinolone cream (KENALOG) 0.1 %, Apply 1 application topically 2 (two) times daily., Disp: 453.6 g, Rfl: 0   TYLENOL 500 MG tablet, Take 1,000 mg by mouth every 8 (eight) hours as needed., Disp: , Rfl:    VOLTAREN 1 % GEL, Apply 4 gram to affected area four times a day as needed for pain for knee pain. Do not exceed 16gm for LE joint per day, Disp: , Rfl:   No Known Allergies  I personally reviewed {Reviewed:[id number]} with the patient/caregiver today.   ROS    Objective  There were no vitals filed for this visit.  There is no height or weight on file to calculate BMI.  Physical Exam   Recent Results (from the past 2160 hour(s))  Bayer DCA Hb A1c Waived     Status: Abnormal   Collection Time: 07/09/21  9:17 AM  Result Value Ref Range   HB A1C (BAYER DCA - WAIVED) 5.8 (H) 4.8 - 5.6 %    Comment:          Prediabetes: 5.7 - 6.4          Diabetes: >6.4          Glycemic control for adults with diabetes: <7.0   Urinalysis, Routine w reflex microscopic     Status: None   Collection Time: 07/09/21  9:17 AM  Result Value Ref Range   Specific Gravity, UA 1.015 1.005 - 1.030   pH, UA 6.5 5.0 - 7.5   Color, UA Yellow Yellow   Appearance Ur Clear Clear   Leukocytes,UA Negative Negative   Protein,UA Negative Negative/Trace   Glucose, UA Negative Negative   Ketones, UA Negative Negative   RBC, UA Negative Negative   Bilirubin, UA Negative Negative   Urobilinogen, Ur 0.2 0.2 - 1.0 mg/dL   Nitrite, UA Negative Negative  TSH     Status: None   Collection Time: 07/09/21  9:19 AM  Result Value Ref Range   TSH 2.300 0.450 - 4.500 uIU/mL  CBC  with Differential/Platelet     Status: Abnormal   Collection Time: 07/09/21  9:19 AM  Result Value Ref Range   WBC 3.0 (L) 3.4 - 10.8 x10E3/uL   RBC 3.49 (L) 4.14 - 5.80 x10E6/uL   Hemoglobin 10.4 (L) 13.0 - 17.7 g/dL   Hematocrit 31.0 (L) 37.5 - 51.0 %   MCV 89 79 - 97 fL   MCH 29.8 26.6 - 33.0 pg   MCHC 33.5 31.5 - 35.7 g/dL   RDW 15.5 (H) 11.6 - 15.4 %  Platelets 156 150 - 450 x10E3/uL   Neutrophils 52 Not Estab. %   Lymphs 25 Not Estab. %   Monocytes 18 Not Estab. %   Eos 3 Not Estab. %   Basos 1 Not Estab. %   Neutrophils Absolute 1.6 1.4 - 7.0 x10E3/uL   Lymphocytes Absolute 0.7 0.7 - 3.1 x10E3/uL   Monocytes Absolute 0.5 0.1 - 0.9 x10E3/uL   EOS (ABSOLUTE) 0.1 0.0 - 0.4 x10E3/uL   Basophils Absolute 0.0 0.0 - 0.2 x10E3/uL   Immature Granulocytes 1 Not Estab. %   Immature Grans (Abs) 0.0 0.0 - 0.1 x10E3/uL  Lipid panel     Status: None   Collection Time: 07/09/21  9:19 AM  Result Value Ref Range   Cholesterol, Total 148 100 - 199 mg/dL   Triglycerides 116 0 - 149 mg/dL   HDL 65 >39 mg/dL   VLDL Cholesterol Cal 20 5 - 40 mg/dL   LDL Chol Calc (NIH) 63 0 - 99 mg/dL   Chol/HDL Ratio 2.3 0.0 - 5.0 ratio    Comment:                                   T. Chol/HDL Ratio                                             Men  Women                               1/2 Avg.Risk  3.4    3.3                                   Avg.Risk  5.0    4.4                                2X Avg.Risk  9.6    7.1                                3X Avg.Risk 23.4   93.7   Basic metabolic panel     Status: Abnormal   Collection Time: 07/09/21  9:19 AM  Result Value Ref Range   Glucose 126 (H) 70 - 99 mg/dL   BUN 16 8 - 27 mg/dL   Creatinine, Ser 1.20 0.76 - 1.27 mg/dL   eGFR 68 >59 mL/min/1.73   BUN/Creatinine Ratio 13 10 - 24   Sodium 139 134 - 144 mmol/L   Potassium 3.5 3.5 - 5.2 mmol/L   Chloride 100 96 - 106 mmol/L   CO2 23 20 - 29 mmol/L   Calcium 9.5 8.6 - 10.2 mg/dL  Lipase, blood      Status: None   Collection Time: 08/21/21 11:18 AM  Result Value Ref Range   Lipase 49 11 - 51 U/L    Comment: Performed at [facility], [address], [place], [place] [address]  Comprehensive metabolic panel     Status: Abnormal   Collection Time: 08/21/21 11:18 AM  Result Value Ref Range   Sodium 134 (L) 135 -  145 mmol/L   Potassium 3.5 3.5 - 5.1 mmol/L   Chloride 99 98 - 111 mmol/L   CO2 21 (L) 22 - 32 mmol/L   Glucose, Bld 131 (H) 70 - 99 mg/dL    Comment: Glucose reference range applies only to samples taken after fasting for at least 8 hours.   BUN 27 (H) 8 - 23 mg/dL   Creatinine, Ser 1.29 (H) 0.61 - 1.24 mg/dL   Calcium 8.8 (L) 8.9 - 10.3 mg/dL   Total Protein 7.8 6.5 - 8.1 g/dL   Albumin 3.8 3.5 - 5.0 g/dL   AST 43 (H) 15 - 41 U/L   ALT 27 0 - 44 U/L   Alkaline Phosphatase 88 38 - 126 U/L   Total Bilirubin 1.6 (H) 0.3 - 1.2 mg/dL   GFR, Estimated 21 (L) >60 mL/min    Comment: (NOTE) Calculated using the CKD-EPI Creatinine Equation (2021)    Anion gap 14 5 - 15    Comment: Performed at [facility], [address] [place], [place] [address]  CBC     Status: Abnormal   Collection Time: 08/21/21 11:18 AM  Result Value Ref Range   WBC 3.6 (L) 4.0 - 10.5 K/uL   RBC 3.31 (L) 4.22 - 5.81 MIL/uL   Hemoglobin 9.5 (L) 13.0 - 17.0 g/dL   HCT 39.1 (L) 79.2 - 17.8 %   MCV 88.2 80.0 - 100.0 fL   MCH 28.7 26.0 - 34.0 pg   MCHC 32.5 30.0 - 36.0 g/dL   RDW 37.5 42.3 - 70.2 %   Platelets 175 150 - 400 K/uL   nRBC 0.0 0.0 - 0.2 %    Comment: Performed at [facility], [address] [place], [place] [address]  Urinalysis, Routine w reflex microscopic     Status: Abnormal   Collection Time: 08/21/21 11:36 AM  Result Value Ref Range   Color, Urine YELLOW (A) YELLOW   APPearance HAZY (A) CLEAR   Specific Gravity, Urine 1.013 1.005 - 1.030   pH 5.0 5.0 - 8.0   Glucose, UA NEGATIVE NEGATIVE mg/dL   Hgb urine dipstick NEGATIVE NEGATIVE   Bilirubin  Urine NEGATIVE NEGATIVE   Ketones, ur 5 (A) NEGATIVE mg/dL   Protein, ur 30 (A) NEGATIVE mg/dL   Nitrite NEGATIVE NEGATIVE   Leukocytes,Ua NEGATIVE NEGATIVE   RBC / HPF 0-5 0 - 5 RBC/hpf   WBC, UA 0-5 0 - 5 WBC/hpf   Bacteria, UA RARE (A) NONE SEEN   Squamous Epithelial / LPF 0-5 0 - 5   Mucus PRESENT    Hyaline Casts, UA PRESENT     Comment: Performed at [facility], [address] [place], [place] [address]     PHQ2/9:    07/09/2021    9:51 AM 01/14/2021    9:40 AM 07/19/2020    8:37 AM  Depression screen PHQ 2/9  Decreased Interest 2 0 0  Down, Depressed, Hopeless 1 0 0  PHQ - 2 Score 3 0 0  Altered sleeping 1 1   Tired, decreased energy 3 3   Change in appetite 1 0   Feeling bad or failure about yourself  0 0   Trouble concentrating 0 1   Moving slowly or fidgety/restless 0 0   Suicidal thoughts 0 0   PHQ-9 Score 8 5   Difficult doing work/chores Somewhat difficult Not difficult at all       Fall Risk:    07/09/2021  9:51 AM 01/14/2021    9:40 AM 07/19/2020    8:37 AM  Fall Risk   Falls in the past year? 0 0 0  Number falls in past yr: 0 0 0  Injury with Fall? 0 0 0  Risk for fall due to : No Fall Risks  No Fall Risks  Follow up Falls evaluation completed  Falls evaluation completed      Functional Status Survey:      Assessment & Plan

## 2021-08-31 IMAGING — CT CT CERVICAL SPINE W/O CM
3 of 4 series · 9 of 33 positions shown, 11 images · non-contrast
Comparison: CT head and cervical spine May 27, 2017

CLINICAL DATA: Found unresponsive, no known injury, large amount of
ETOH

EXAM:
CT HEAD WITHOUT CONTRAST
CT CERVICAL SPINE WITHOUT CONTRAST
TECHNIQUE: Multidetector CT imaging of the head and cervical spine was
performed following the standard protocol without intravenous
contrast. Multiplanar CT image reconstructions of the cervical spine
were also generated.

[Series 6: sagittal bone · sagittal · 0.21mm/px · 5 of 82 slices shown, 6 images]
[im 28/82  bone]
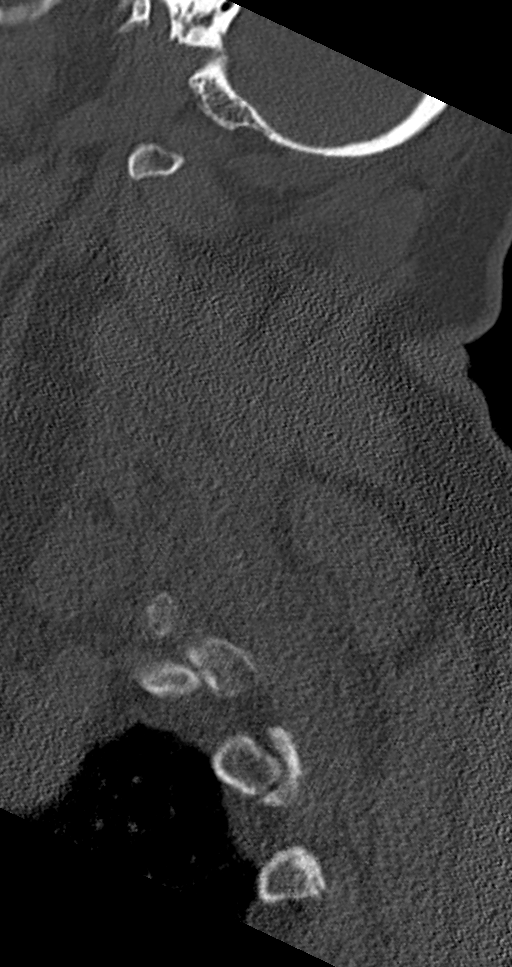
[im 34/82  bone]
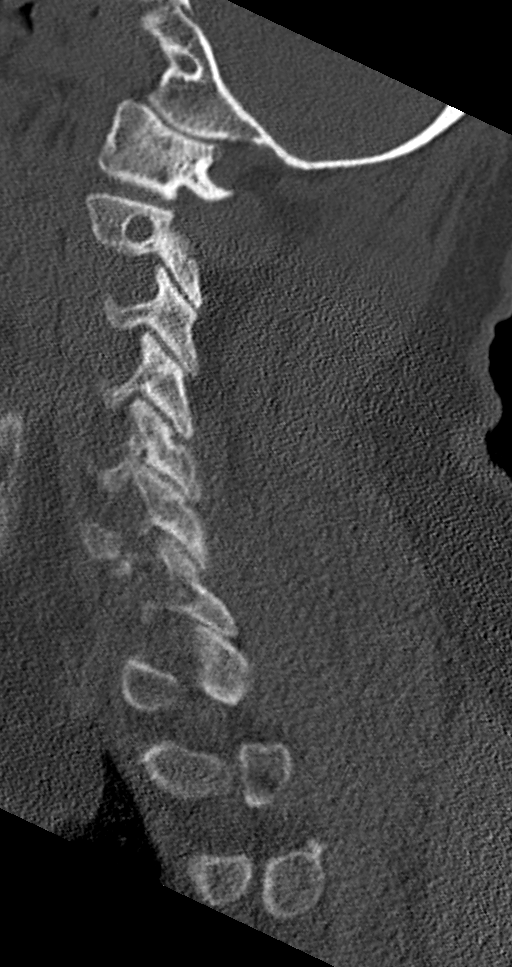
[im 41/82  soft-tissue]
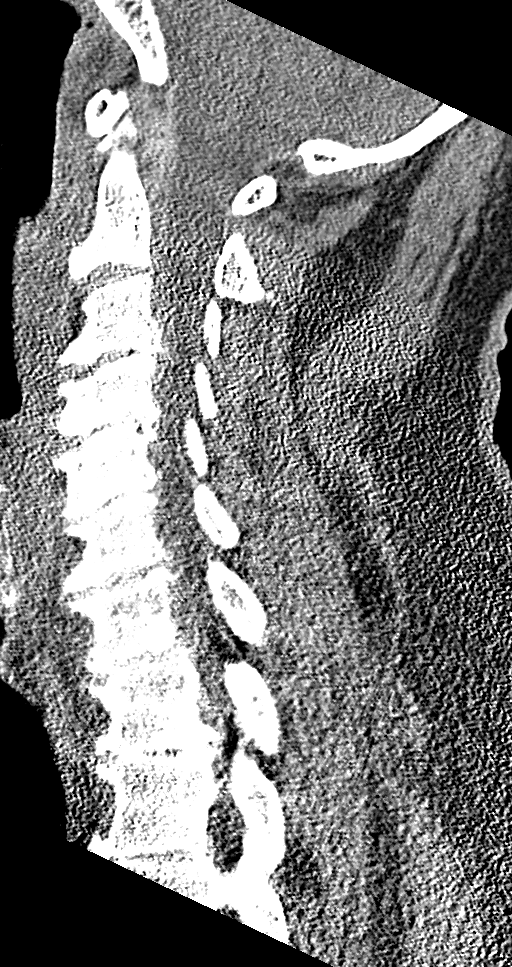
[im 41/82  bone]
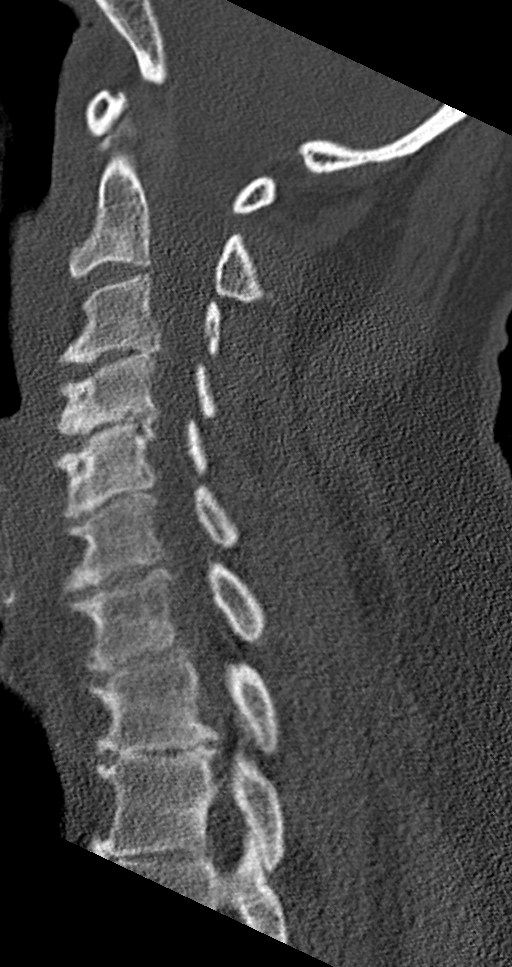
[im 48/82  bone]
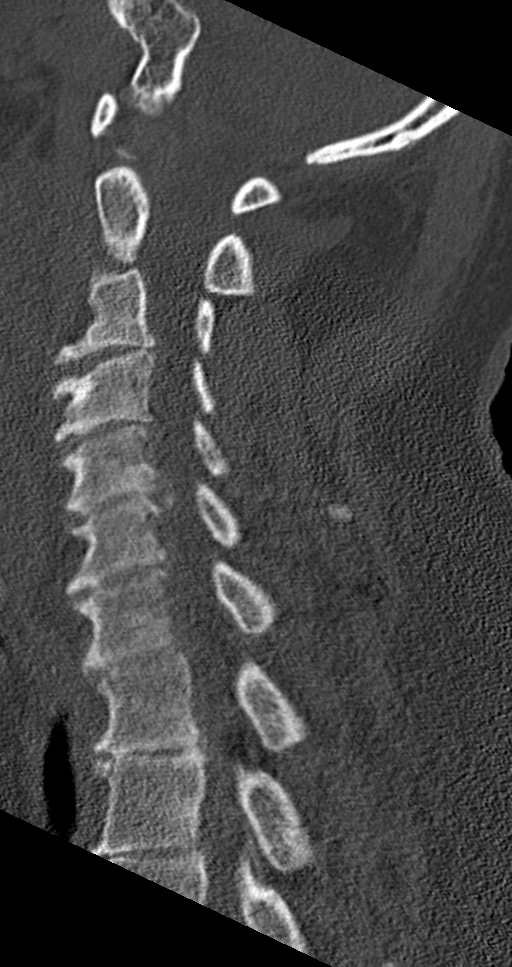
[im 55/82  bone]
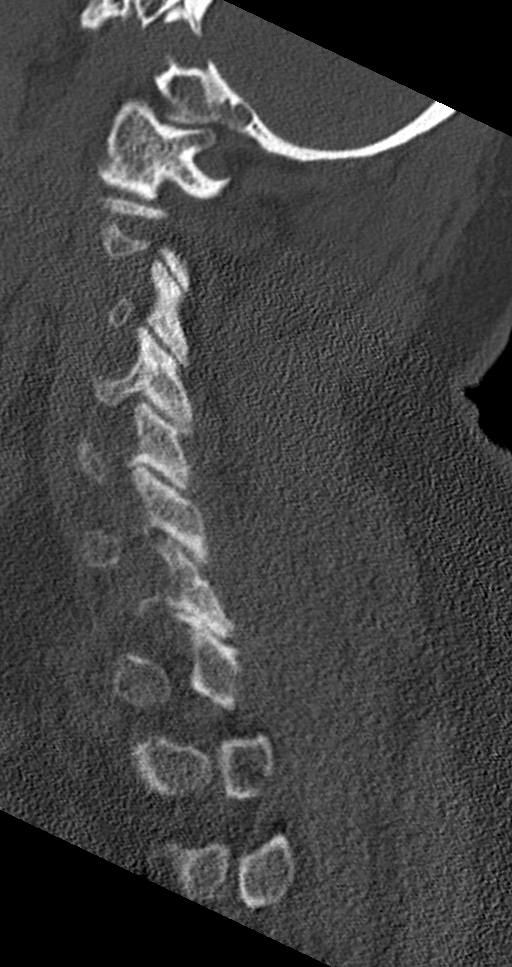

[Series 7: coronal bone · coronal · 0.32mm/px · 3 of 55 slices shown]
[im 11/55  bone]
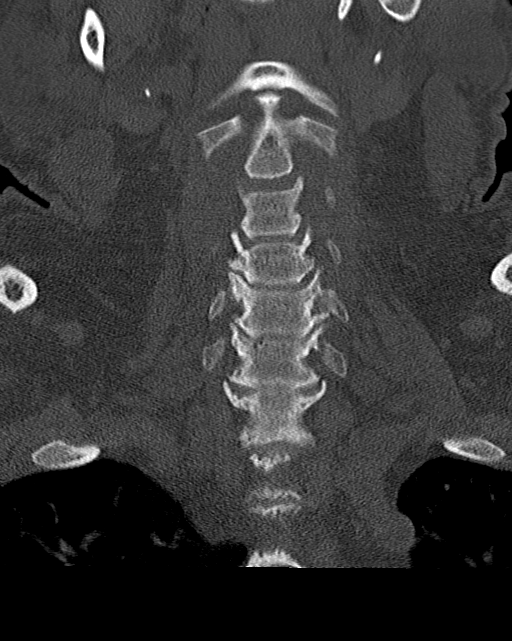
[im 22/55  bone]
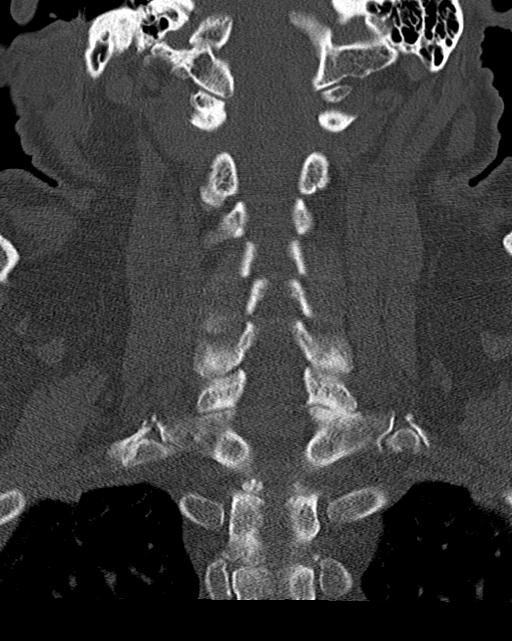
[im 33/55  bone]
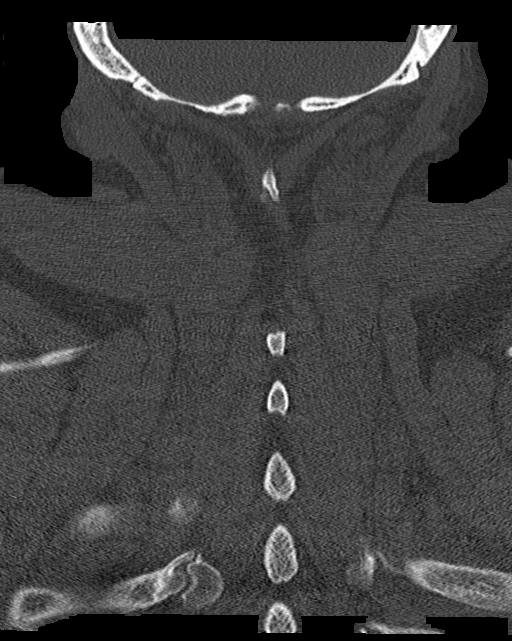

[Series 8: orthogonal bone · axial · 0.21mm/px · z∈[+440,+440]mm · 1 of 103 slices shown, 2 images]
[im 59/103  soft-tissue]
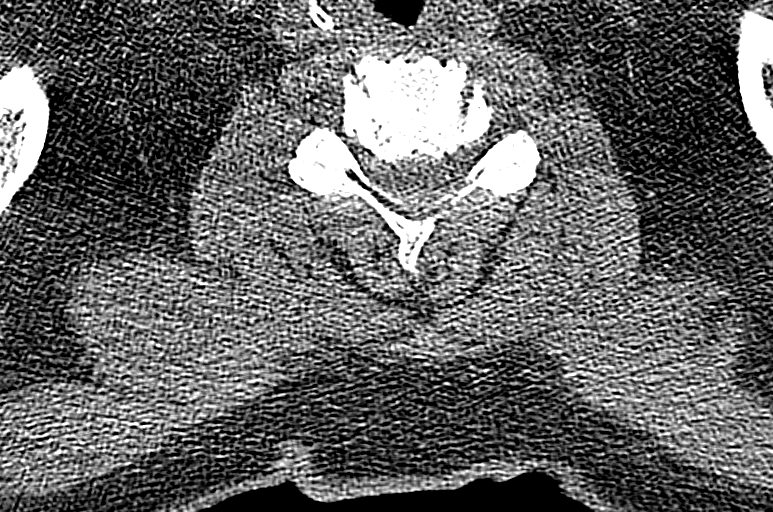
[im 59/103  bone]
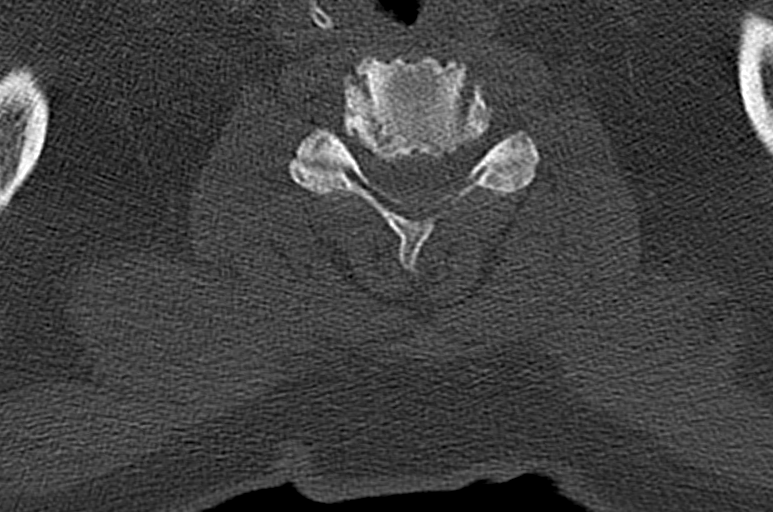

[9 of 33 positions shown; findings below may reference images not displayed]

FINDINGS: CT HEAD FINDINGS

Brain: No evidence of acute infarction, hemorrhage, hydrocephalus,
extra-axial collection or mass lesion/mass effect.

Vascular: No hyperdense vessel or unexpected calcification.

Skull: Small amount of likely scarring in the left frontal soft
tissues. No scalp swelling or hematoma. No calvarial fracture.

Sinuses/Orbits: Minimal mural thickening in the ethmoids and right
maxillary sinus. Remaining paranasal sinuses and mastoid air cells
are predominantly clear. Included orbital structures are
unremarkable.

Other: None

CT CERVICAL SPINE FINDINGS

Alignment: Preservation of the normal cervical lordosis without
traumatic listhesis. No abnormal facet widening. Normal alignment of
the craniocervical and atlantoaxial articulations.

Skull base and vertebrae: No acute fracture. No primary bone lesion
or focal pathologic process.

Soft tissues and spinal canal: No pre or paravertebral fluid or
swelling. No visible canal hematoma.

Disc levels: Multilevel intervertebral disc height loss with
spondylitic endplate changes. Features are maximal C4-C6 with mild
canal stenosis C4-5 and C5-6. There is mild-to-moderate neural
foraminal narrowing on the left at C5-6 and bilaterally at C6-7.

Upper chest: No acute abnormality in the upper chest or imaged lung
apices.

Other: Calcifications seen in the cervical carotids and proximal
great vessels arising from the aortic arch. Normal thyroid
IMPRESSION: 1. No evidence of acute intracranial pathology.
2. No fracture or traumatic listhesis of the cervical spine.
3. Multilevel degenerative disc disease of the cervical spine.
Detailed above.
4. Calcifications in the cervical carotids and proximal great
vessels arising from the aortic arch.

## 2021-08-31 IMAGING — CT CT HEAD W/O CM
3 series · 14 of 47 positions shown, 16 images · non-contrast
Comparison: CT head and cervical spine May 27, 2017

CLINICAL DATA: Found unresponsive, no known injury, large amount of
ETOH

EXAM:
CT HEAD WITHOUT CONTRAST
CT CERVICAL SPINE WITHOUT CONTRAST
TECHNIQUE: Multidetector CT imaging of the head and cervical spine was
performed following the standard protocol without intravenous
contrast. Multiplanar CT image reconstructions of the cervical spine
were also generated.

[Series 3: head wo · axial · 0.39mm/px · z∈[+543,+668]mm · 8 of 30 slices shown, 10 images]
[im 3/30  brain]
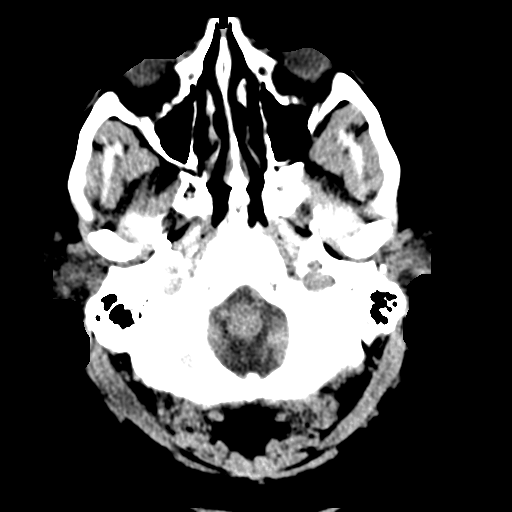
[im 3/30  bone]
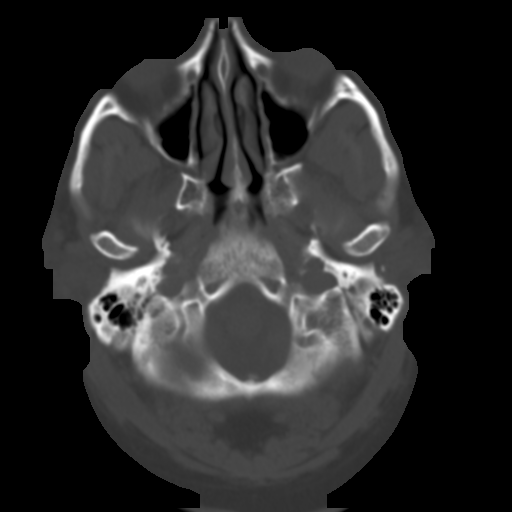
[im 7/30  brain]
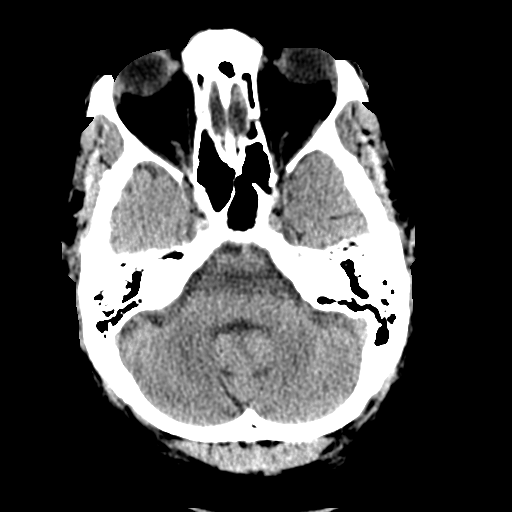
[im 10/30  brain]
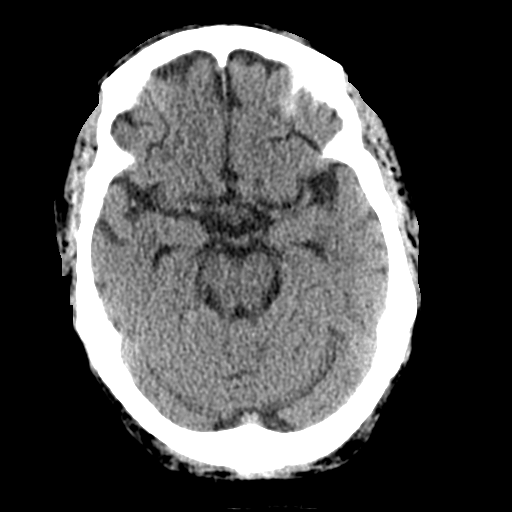
[im 14/30  brain]
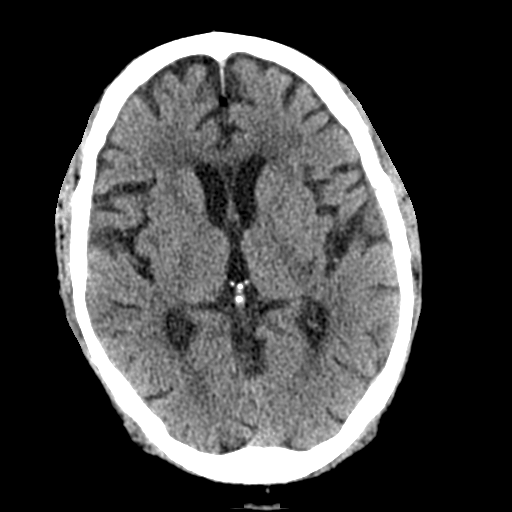
[im 17/30  brain]
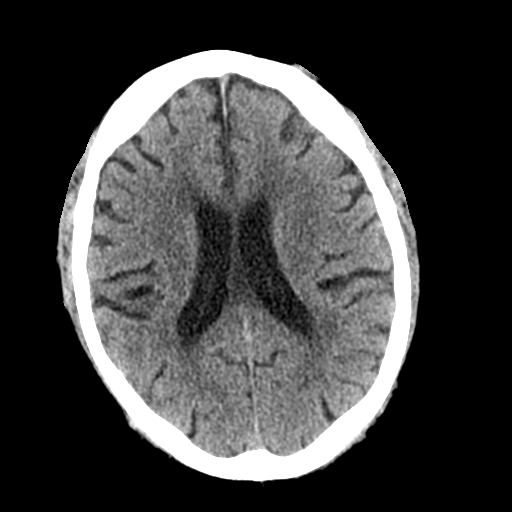
[im 17/30  bone]
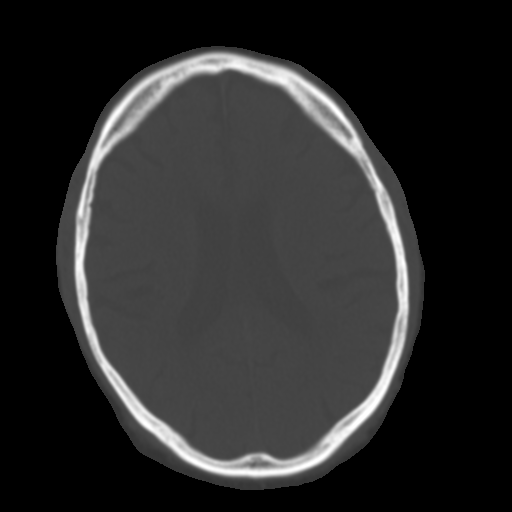
[im 21/30  brain]
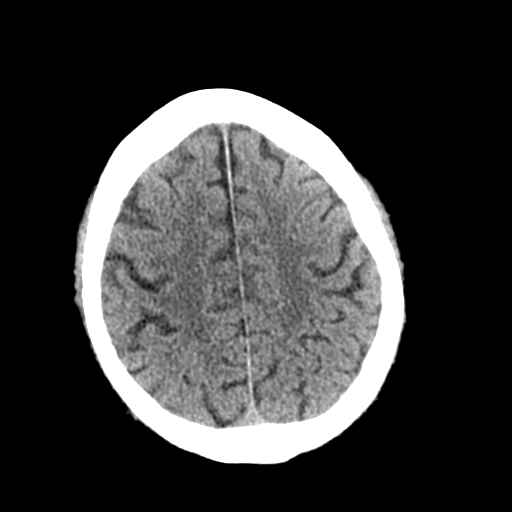
[im 24/30  brain]
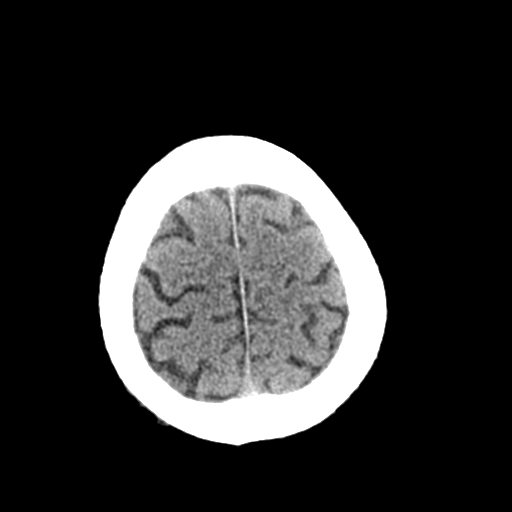
[im 28/30  brain]
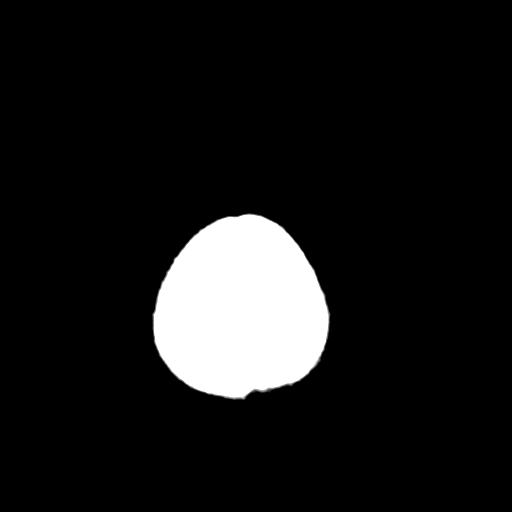

[Series 4: coronal soft tissue · coronal · 0.31mm/px · 3 of 68 slices shown]
[im 23/68  brain]
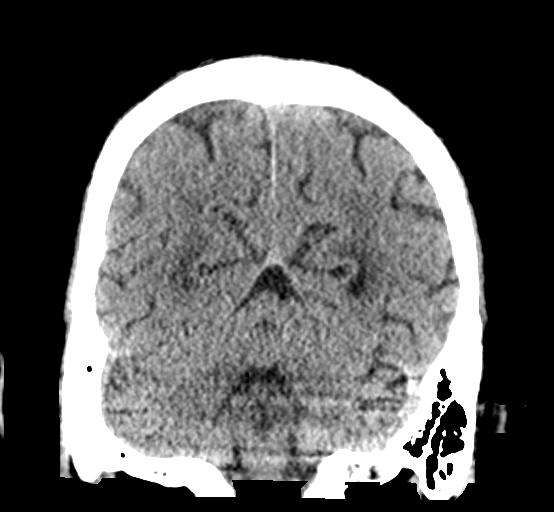
[im 30/68  brain]
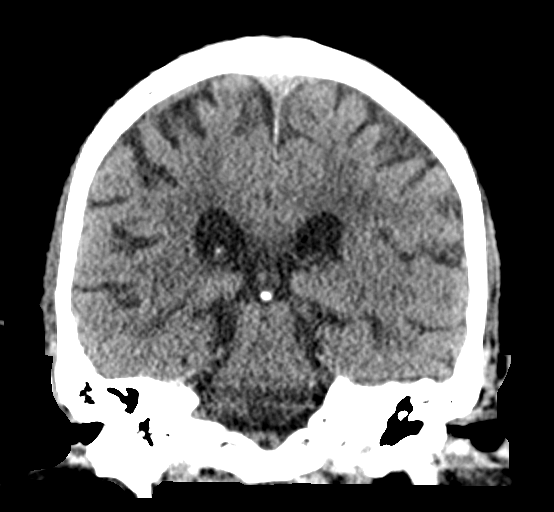
[im 38/68  brain]
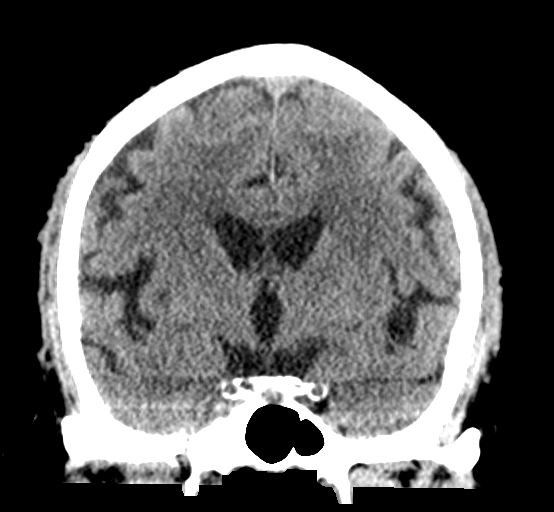

[Series 5: sagittal soft tissue · sagittal · 0.31mm/px · 3 of 57 slices shown]
[im 19/57  brain]
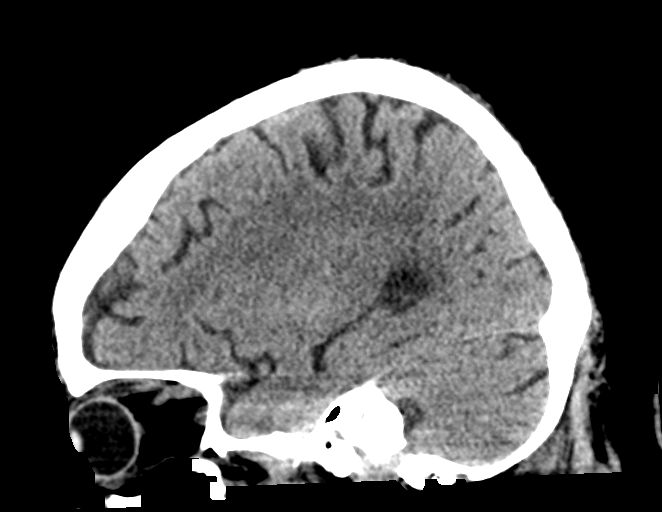
[im 29/57  brain]
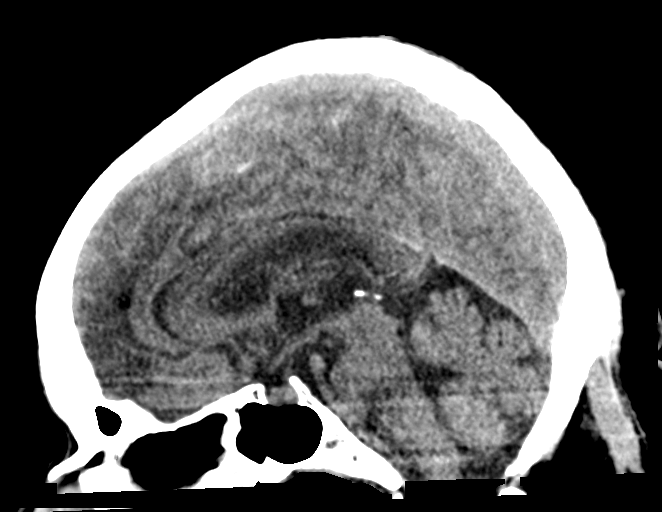
[im 38/57  brain]
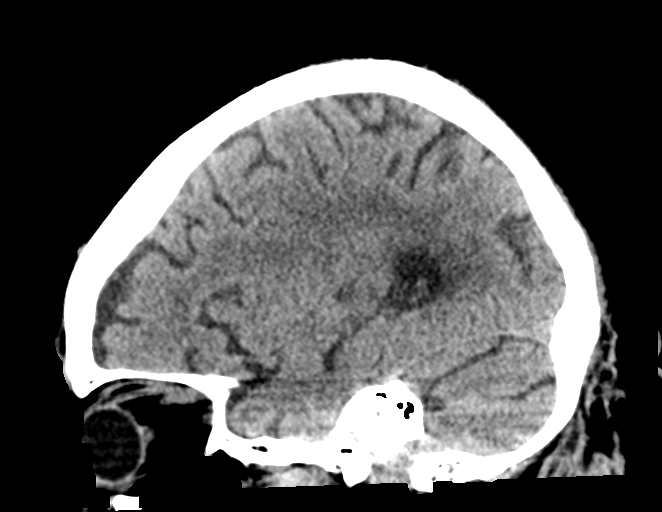

[14 of 47 positions shown; findings below may reference images not displayed]

FINDINGS: CT HEAD FINDINGS

Brain: No evidence of acute infarction, hemorrhage, hydrocephalus,
extra-axial collection or mass lesion/mass effect.

Vascular: No hyperdense vessel or unexpected calcification.

Skull: Small amount of likely scarring in the left frontal soft
tissues. No scalp swelling or hematoma. No calvarial fracture.

Sinuses/Orbits: Minimal mural thickening in the ethmoids and right
maxillary sinus. Remaining paranasal sinuses and mastoid air cells
are predominantly clear. Included orbital structures are
unremarkable.

Other: None

CT CERVICAL SPINE FINDINGS

Alignment: Preservation of the normal cervical lordosis without
traumatic listhesis. No abnormal facet widening. Normal alignment of
the craniocervical and atlantoaxial articulations.

Skull base and vertebrae: No acute fracture. No primary bone lesion
or focal pathologic process.

Soft tissues and spinal canal: No pre or paravertebral fluid or
swelling. No visible canal hematoma.

Disc levels: Multilevel intervertebral disc height loss with
spondylitic endplate changes. Features are maximal C4-C6 with mild
canal stenosis C4-5 and C5-6. There is mild-to-moderate neural
foraminal narrowing on the left at C5-6 and bilaterally at C6-7.

Upper chest: No acute abnormality in the upper chest or imaged lung
apices.

Other: Calcifications seen in the cervical carotids and proximal
great vessels arising from the aortic arch. Normal thyroid
IMPRESSION: 1. No evidence of acute intracranial pathology.
2. No fracture or traumatic listhesis of the cervical spine.
3. Multilevel degenerative disc disease of the cervical spine.
Detailed above.
4. Calcifications in the cervical carotids and proximal great
vessels arising from the aortic arch.

## 2021-09-13 ENCOUNTER — Encounter: Payer: Self-pay | Admitting: Internal Medicine

## 2021-09-17 ENCOUNTER — Ambulatory Visit: Payer: Medicare Other | Admitting: Nurse Practitioner

## 2021-09-17 ENCOUNTER — Other Ambulatory Visit: Payer: Medicare Other

## 2021-09-23 ENCOUNTER — Inpatient Hospital Stay: Payer: Medicare Other | Attending: Oncology

## 2021-09-24 ENCOUNTER — Ambulatory Visit (INDEPENDENT_AMBULATORY_CARE_PROVIDER_SITE_OTHER): Payer: Medicare Other | Admitting: *Deleted

## 2021-09-24 DIAGNOSIS — Z0000 Encounter for general adult medical examination without abnormal findings: Secondary | ICD-10-CM | POA: Diagnosis not present

## 2021-09-24 NOTE — Patient Instructions (Signed)
George Gomez , Thank you for taking time to come for your Medicare Wellness Visit. I appreciate your ongoing commitment to your health goals. Please review the following plan we discussed and let me know if I can assist you in the future.   Screening recommendations/referrals: Colonoscopy: up to date Recommended yearly ophthalmology/optometry visit for glaucoma screening and checkup Recommended yearly dental visit for hygiene and checkup  Vaccinations: Influenza vaccine: up to date Pneumococcal vaccine: Education provided Tdap vaccine: Education provided Shingles vaccine: Education provided    Advanced directives: Education provided    Next appointment: 10-22-2021 @ 10:40  [place]  Preventive Care 40-64 Years, Male Preventive care refers to lifestyle choices and visits with your health care provider that can promote health and wellness. What does preventive care include? A yearly physical exam. This is also called an annual well check. Dental exams once or twice a year. Routine eye exams. Ask your health care provider how often you should have your eyes checked. Personal lifestyle choices, including: Daily care of your teeth and gums. Regular physical activity. Eating a healthy diet. Avoiding tobacco and drug use. Limiting alcohol use. Practicing safe sex. Taking low-dose aspirin every day starting at age 51. What happens during an annual well check? The services and screenings done by your health care provider during your annual well check will depend on your age, overall health, lifestyle risk factors, and family history of disease. Counseling  Your health care provider may ask you questions about your: Alcohol use. Tobacco use. Drug use. Emotional well-being. Home and relationship well-being. Sexual activity. Eating habits. Work and work Astronomer. Screening  You may have the following tests or measurements: Height, weight, and BMI. Blood pressure. Lipid and  cholesterol levels. These may be checked every 5 years, or more frequently if you are over 37 years old. Skin check. Lung cancer screening. You may have this screening every year starting at age 43 if you have a 30-pack-year history of smoking and currently smoke or have quit within the past 15 years. Fecal occult blood test (FOBT) of the stool. You may have this test every year starting at age 29. Flexible sigmoidoscopy or colonoscopy. You may have a sigmoidoscopy every 5 years or a colonoscopy every 10 years starting at age 36. Prostate cancer screening. Recommendations will vary depending on your family history and other risks. Hepatitis C blood test. Hepatitis B blood test. Sexually transmitted disease (STD) testing. Diabetes screening. This is done by checking your blood sugar (glucose) after you have not eaten for a while (fasting). You may have this done every 1-3 years. Discuss your test results, treatment options, and if necessary, the need for more tests with your health care provider. Vaccines  Your health care provider may recommend certain vaccines, such as: Influenza vaccine. This is recommended every year. Tetanus, diphtheria, and acellular pertussis (Tdap, Td) vaccine. You may need a Td booster every 10 years. Zoster vaccine. You may need this after age 59. Pneumococcal 13-valent conjugate (PCV13) vaccine. You may need this if you have certain conditions and have not been vaccinated. Pneumococcal polysaccharide (PPSV23) vaccine. You may need one or two doses if you smoke cigarettes or if you have certain conditions. Talk to your health care provider about which screenings and vaccines you need and how often you need them. This information is not intended to replace advice given to you by your health care provider. Make sure you discuss any questions you have with your health care provider. Document Released: 02/09/2015  Document Revised: 10/03/2015 Document Reviewed:  11/14/2014 Elsevier Interactive Patient Education  2017 [employer].  Fall Prevention in the Home Falls can cause injuries. They can happen to people of all ages. There are many things you can do to make your home safe and to help prevent falls. What can I do on the outside of my home? Regularly fix the edges of walkways and driveways and fix any cracks. Remove anything that might make you trip as you walk through a door, such as a raised step or threshold. Trim any bushes or trees on the path to your home. Use bright outdoor lighting. Clear any walking paths of anything that might make someone trip, such as rocks or tools. Regularly check to see if handrails are loose or broken. Make sure that both sides of any steps have handrails. Any raised decks and porches should have guardrails on the edges. Have any leaves, snow, or ice cleared regularly. Use sand or salt on walking paths during winter. Clean up any spills in your garage right away. This includes oil or grease spills. What can I do in the bathroom? Use night lights. Install grab bars by the toilet and in the tub and shower. Do not use towel bars as grab bars. Use non-skid mats or decals in the tub or shower. If you need to sit down in the shower, use a plastic, non-slip stool. Keep the floor dry. Clean up any water that spills on the floor as soon as it happens. Remove soap buildup in the tub or shower regularly. Attach bath mats securely with double-sided non-slip rug tape. Do not have throw rugs and other things on the floor that can make you trip. What can I do in the bedroom? Use night lights. Make sure that you have a light by your bed that is easy to reach. Do not use any sheets or blankets that are too big for your bed. They should not hang down onto the floor. Have a firm chair that has side arms. You can use this for support while you get dressed. Do not have throw rugs and other things on the floor that can make you  trip. What can I do in the kitchen? Clean up any spills right away. Avoid walking on wet floors. Keep items that you use a lot in easy-to-reach places. If you need to reach something above you, use a strong step stool that has a grab bar. Keep electrical cords out of the way. Do not use floor polish or wax that makes floors slippery. If you must use wax, use non-skid floor wax. Do not have throw rugs and other things on the floor that can make you trip. What can I do with my stairs? Do not leave any items on the stairs. Make sure that there are handrails on both sides of the stairs and use them. Fix handrails that are broken or loose. Make sure that handrails are as long as the stairways. Check any carpeting to make sure that it is firmly attached to the stairs. Fix any carpet that is loose or worn. Avoid having throw rugs at the top or bottom of the stairs. If you do have throw rugs, attach them to the floor with carpet tape. Make sure that you have a light switch at the top of the stairs and the bottom of the stairs. If you do not have them, ask someone to add them for you. What else can I do to help prevent falls?  Wear shoes that: Do not have high heels. Have rubber bottoms. Are comfortable and fit you well. Are closed at the toe. Do not wear sandals. If you use a stepladder: Make sure that it is fully opened. Do not climb a closed stepladder. Make sure that both sides of the stepladder are locked into place. Ask someone to hold it for you, if possible. Clearly mark and make sure that you can see: Any grab bars or handrails. First and last steps. Where the edge of each step is. Use tools that help you move around (mobility aids) if they are needed. These include: Canes. Walkers. Scooters. Crutches. Turn on the lights when you go into a dark area. Replace any light bulbs as soon as they burn out. Set up your furniture so you have a clear path. Avoid moving your furniture  around. If any of your floors are uneven, fix them. If there are any pets around you, be aware of where they are. Review your medicines with your doctor. Some medicines can make you feel dizzy. This can increase your chance of falling. Ask your doctor what other things that you can do to help prevent falls. This information is not intended to replace advice given to you by your health care provider. Make sure you discuss any questions you have with your health care provider. Document Released: 11/09/2008 Document Revised: 06/21/2015 Document Reviewed: 02/17/2014 Elsevier Interactive Patient Education  2017 [employer].

## 2021-09-24 NOTE — Progress Notes (Signed)
Subjective:   George Gomez is a 61 y.o. male who presents for an Initial Medicare Annual Wellness Visit.  I connected with  Eagan Baerga on 09/24/21 by a telephone enabled telemedicine application and verified that I am speaking with the correct person using two identifiers.   I discussed the limitations of evaluation and management by telemedicine. The patient expressed understanding and agreed to proceed.  Patient location: home  Provider location:  Tele-Health-home    Review of Systems     Cardiac Risk Factors include: advanced age (>60men, >44 women);diabetes mellitus;male gender;family history of premature cardiovascular disease;obesity (BMI >30kg/m2);hypertension     Objective:    Today's Vitals   09/24/21 0847  PainSc: 4    There is no height or weight on file to calculate BMI.     09/24/2021    8:51 AM 08/21/2021   11:17 AM 05/06/2021    2:05 PM 03/20/2021    1:09 PM 09/12/2020   11:14 AM 06/12/2020    1:50 PM 09/27/2019   11:43 AM  Advanced Directives  Does Patient Have a Medical Advance Directive? No No No No No No No  Would patient like information on creating a medical advance directive? No - Patient declined No - Patient declined No - Patient declined No - Patient declined Yes (MAU/Ambulatory/Procedural Areas - Information given) No - Patient declined No - Patient declined    Current Medications (verified) Outpatient Encounter Medications as of 09/24/2021  Medication Sig   allopurinol (ZYLOPRIM) 300 MG tablet TAKE 1 TABLET(300 MG) BY MOUTH DAILY   amLODipine (NORVASC) 10 MG tablet Take 1 tablet (10 mg total) by mouth daily.   atorvastatin (LIPITOR) 40 MG tablet TAKE 1 TABLET BY MOUTH ONCE DAILY   gabapentin (NEURONTIN) 300 MG capsule Take 2 capsules (600 mg total) by mouth at bedtime.   loratadine (CLARITIN) 10 MG tablet Take 10 mg by mouth daily.   losartan (COZAAR) 100 MG tablet Take 1 tablet (100 mg total) by mouth daily.   Magnesium Oxide 200 MG TABS  Take 1 tablet (200 mg total) by mouth 2 (two) times daily.   metFORMIN (GLUCOPHAGE-XR) 500 MG 24 hr tablet Take 1 tablet (500 mg total) by mouth daily.   Multiple Vitamin (MULTIVITAMIN ADULT PO) Gnp Century Mature Multivitamin Tab 125c   ondansetron (ZOFRAN-ODT) 4 MG disintegrating tablet Take 1 tablet (4 mg total) by mouth every 8 (eight) hours as needed for nausea or vomiting.   pantoprazole (PROTONIX) 40 MG tablet Take 1 tablet (40 mg total) by mouth daily.   propranolol ER (INDERAL LA) 60 MG 24 hr capsule Take 1 capsule (60 mg total) by mouth daily. needs appointment for further refills   triamcinolone cream (KENALOG) 0.1 % Apply 1 application topically 2 (two) times daily.   TYLENOL 500 MG tablet Take 1,000 mg by mouth every 8 (eight) hours as needed.   VOLTAREN 1 % GEL Apply 4 gram to affected area four times a day as needed for pain for knee pain. Do not exceed 16gm for LE joint per day   No facility-administered encounter medications on file as of 09/24/2021.    Allergies (verified) Patient has no known allergies.   History: Past Medical History:  Diagnosis Date   Acute on chronic renal failure ([place]) 03/20/2016   Acute renal failure (ARF) ([place]) 08/28/2016   Acute renal failure ([place]) 10/12/2018   Anemia    B12 deficiency 12/11/2017   Clostridium difficile diarrhea 03/18/2016   Diabetes mellitus without complication (  HCC)    Diverticulitis    Pt states diverticulitis   EtOH dependence (HCC)    Folate deficiency 07/25/2018   GI bleed 05/30/2017   Hypercholesteremia    Hypertension    Melena 10/21/2018   Weight loss 09/15/2018   Past Surgical History:  Procedure Laterality Date   COLONOSCOPY WITH PROPOFOL N/A 10/16/2018   Procedure: COLONOSCOPY WITH PROPOFOL;  Surgeon: Toney Reil, MD;  Location[facility] ENDOSCOPY;  Service: Gastroenterology;  Laterality: N/A;   ENTEROSCOPY N/A 10/16/2018   Procedure: ENTEROSCOPY;  Surgeon: Toney Reil, MD;  Location: [facility] ENDOSCOPY;   Service: Gastroenterology;  Laterality: N/A;   ESOPHAGOGASTRODUODENOSCOPY N/A 07/02/2018   Procedure: ESOPHAGOGASTRODUODENOSCOPY (EGD);  Surgeon: Toney Reil, MD;  Location: [facility] ENDOSCOPY;  Service: Gastroenterology;  Laterality: N/A;   ESOPHAGOGASTRODUODENOSCOPY (EGD) WITH PROPOFOL N/A 06/01/2017   Procedure: ESOPHAGOGASTRODUODENOSCOPY (EGD) WITH PROPOFOL;  Surgeon: Wyline Mood, MD;  Location: [facility] ENDOSCOPY;  Service: Gastroenterology;  Laterality: N/A;   GIVENS CAPSULE STUDY N/A 10/17/2018   Procedure: GIVENS CAPSULE STUDY;  Surgeon: Toney Reil, MD;  Location: [facility] ENDOSCOPY;  Service: Gastroenterology;  Laterality: N/A;   GIVENS CAPSULE STUDY N/A 08/02/2019   Procedure: GIVENS CAPSULE STUDY;  Surgeon: Toney Reil, MD;  Location: [facility] ENDOSCOPY;  Service: Gastroenterology;  Laterality: N/A;   none     Family History  Problem Relation Age of Onset   Rheum arthritis Mother    Diabetes Father    Heart disease Father    Cancer Father    COPD Sister    Rheum arthritis Sister    Diabetes Maternal Grandmother    Cancer Maternal Grandfather    Diabetes Paternal Grandmother    Cancer Paternal Grandfather    Social History   Socioeconomic History   Marital status: Divorced    Spouse name: Not on file   Number of children: Not on file   Years of education: Not on file   Highest education level: Not on file  Occupational History   Occupation: on disability  Tobacco Use   Smoking status: Never   Smokeless tobacco: Never  Vaping Use   Vaping Use: Never used  Substance and Sexual Activity   Alcohol use: Yes    Comment: on occasion   Drug use: No   Sexual activity: Yes  Other Topics Concern   Not on file  Social History Narrative   Not on file   Social Determinants of Health   Financial Resource Strain: Low Risk  (09/24/2021)   Overall Financial Resource Strain (CARDIA)    Difficulty of Paying Living Expenses: Not hard at all  Food Insecurity: No Food  Insecurity (09/24/2021)   Hunger Vital Sign    Worried About Running Out of Food in the Last Year: Never true    Ran Out of Food in the Last Year: Never true  Transportation Needs: No Transportation Needs (09/24/2021)   PRAPARE - Administrator, Civil Service (Medical): No    Lack of Transportation (Non-Medical): No  Physical Activity: Insufficiently Active (09/24/2021)   Exercise Vital Sign    Days of Exercise per Week: 4 days    Minutes of Exercise per Session: 30 min  Stress: No Stress Concern Present (09/24/2021)   [employer] of Occupational Health - Occupational Stress Questionnaire    Feeling of Stress : Only a little  Social Connections: Socially Isolated (09/24/2021)   Social Connection and Isolation Panel [NHANES]    Frequency of Communication with Friends and Family: More  than three times a week    Frequency of Social Gatherings with Friends and Family: More than three times a week    Attends Religious Services: Never    Marine scientist or Organizations: No    Attends Music therapist: Never    Marital Status: Divorced    Tobacco Counseling Counseling given: Not Answered   Clinical Intake:  Pre-visit preparation completed: Yes  Pain : 0-10 Pain Score: 4  Pain Type: Chronic pain Pain Location: Knee Pain Orientation: Right Pain Descriptors / Indicators: Burning, Constant, Aching Pain Onset: More than a month ago Pain Relieving Factors: gabepentin,volteran gel Effect of Pain on Daily Activities: yes  Pain Relieving Factors: gabepentin,volteran gel  Nutritional Risks: None Diabetes: Yes CBG done?: No Did pt. bring in CBG monitor from home?: No  How often do you need to have someone help you when you read instructions, pamphlets, or other written materials from your doctor or pharmacy?: 1 - Never  Diabetic?  Yes  Nutrition Risk Assessment:  Has the patient had any N/V/D within the last 2 months?  No  Does the patient have  any non-healing wounds?  No  Has the patient had any unintentional weight loss or weight gain?  No   Diabetes:  Is the patient diabetic?  Yes  If diabetic, was a CBG obtained today?  No  Did the patient bring in their glucometer from home?  No  How often do you monitor your CBG's? Does not check.   Financial Strains and Diabetes Management:  Are you having any financial strains with the device, your supplies or your medication? No .  Does the patient want to be seen by Chronic Care Management for management of their diabetes?  No  Would the patient like to be referred to a Nutritionist or for Diabetic Management?  No   Diabetic Exams:  Diabetic Eye Exam:  Pt has been advised about the importance in completing this exam.  Diabetic Foot Exam: Pt has been advised about the importance in completing this exam. .    Interpreter Needed?: No  Information entered by :: Leroy Kennedy LPN   Activities of Daily Living    09/24/2021    9:02 AM  In your present state of health, do you have any difficulty performing the following activities:  Hearing? 0  Vision? 0  Difficulty concentrating or making decisions? 0  Walking or climbing stairs? 1  Dressing or bathing? 0  Doing errands, shopping? 0  Preparing Food and eating ? N  Using the Toilet? N  In the past six months, have you accidently leaked urine? N  Do you have problems with loss of bowel control? N  Managing your Medications? N  Managing your Finances? N  Housekeeping or managing your Housekeeping? N    Patient Care Team: Charlynne Cousins, MD as PCP - General (Internal Medicine) Lin Landsman, MD as Consulting Physician (Gastroenterology) Sindy Guadeloupe, MD as Consulting Physician (Hematology and Oncology)  Indicate any recent Medical Services you may have received from other than [facility] providers in the past year (date may be approximate).     Assessment:   This is a routine wellness examination for  [place].  Hearing/Vision screen Hearing Screening - Comments:: No trouble hearing Vision Screening - Comments:: Not  up to date Vision  Dietary issues and exercise activities discussed: Current Exercise Habits: Home exercise routine, Type of exercise: walking, Time (Minutes): 30, Frequency (Times/Week): 4, Weekly Exercise (Minutes/Week): 120, Intensity:  Mild   Goals Addressed             This Visit's Progress    Increase physical activity         Depression Screen    09/24/2021    8:56 AM 07/09/2021    9:51 AM 01/14/2021    9:40 AM 07/19/2020    8:37 AM  PHQ 2/9 Scores  PHQ - 2 Score 0 3 0 0  PHQ- 9 Score 3 8 5      Fall Risk    07/09/2021    9:51 AM 01/14/2021    9:40 AM 07/19/2020    8:37 AM  Fall Risk   Falls in the past year? 0 0 0  Number falls in past yr: 0 0 0  Injury with Fall? 0 0 0  Risk for fall due to : No Fall Risks  No Fall Risks  Follow up Falls evaluation completed  Falls evaluation completed    [place]:  Any stairs in or around the home? No  If so, are there any without handrails? No  Home free of loose throw rugs in walkways, pet beds, electrical cords, etc? Yes  Adequate lighting in your home to reduce risk of falls? Yes   ASSISTIVE DEVICES UTILIZED TO PREVENT FALLS:  Life alert? No  Use of a cane, walker or w/c? Yes  Grab bars in the bathroom? No  Shower chair or bench in shower? No  Elevated toilet seat or a handicapped toilet? No   TIMED UP AND GO:  Was the test performed? No .    Cognitive Function:        09/24/2021    8:49 AM  6CIT Screen  What Year? 0 points  What month? 0 points  What time? 0 points  Count back from 20 0 points  Months in reverse 0 points  Repeat phrase 2 points  Total Score 2 points    Immunizations Immunization History  Administered Date(s) Administered   Influenza,inj,Quad PF,6+ Mos 10/13/2018, 01/14/2021   PFIZER(Purple Top)SARS-COV-2 Vaccination  04/26/2019, 05/17/2019, 01/05/2020, 12/17/2020    TDAP status: Due, Education has been provided regarding the importance of this vaccine. Advised may receive this vaccine at local pharmacy or Health Dept. Aware to provide a copy of the vaccination record if obtained from local pharmacy or Health Dept. Verbalized acceptance and understanding.  Flu Vaccine status: Up to date  Pneumococcal vaccine status: Due, Education has been provided regarding the importance of this vaccine. Advised may receive this vaccine at local pharmacy or Health Dept. Aware to provide a copy of the vaccination record if obtained from local pharmacy or Health Dept. Verbalized acceptance and understanding.  Covid-19 vaccine status: Information provided on how to obtain vaccines.   Qualifies for Shingles Vaccine? Yes   Zostavax completed No   Shingrix Completed?: Yes  Screening Tests Health Maintenance  Topic Date Due   FOOT EXAM  Never done   OPHTHALMOLOGY EXAM  Never done   INFLUENZA VACCINE  08/27/2021   COVID-19 Vaccine (5 - Pfizer series) 10/10/2021 (Originally 02/11/2021)   Zoster Vaccines- Shingrix (1 of 2) 12/25/2021 (Originally 03/07/2008)   TETANUS/TDAP  09/25/2022 (Originally [birth year]/07/1977)   HEMOGLOBIN A1C  01/08/2022   COLONOSCOPY (Pts 45-34yrs Insurance coverage will need to be confirmed)  10/15/2028   Hepatitis C Screening  Completed   HIV Screening  Completed   HPV VACCINES  Aged Out    Health Maintenance  Health Maintenance Due  Topic Date Due   FOOT EXAM  Never done   OPHTHALMOLOGY EXAM  Never done   INFLUENZA VACCINE  08/27/2021    Colorectal cancer screening: Type of screening: Colonoscopy. Completed 2020. Repeat every 10 years  Lung Cancer Screening: (Low Dose CT Chest recommended if Age 59-80 years, 30 pack-year currently smoking OR have quit w/in 15years.) does not qualify.   Lung Cancer Screening Referral:   Additional Screening:  Hepatitis C Screening: does not qualify; Completed  2021  Vision Screening: Recommended annual ophthalmology exams for early detection of glaucoma and other disorders of the eye. Is the patient up to date with their annual eye exam?  No  Who is the provider or what is the name of the office in which the patient attends annual eye exams? Vision If pt is not established with a provider, would they like to be referred to a provider to establish care? No .   Dental Screening: Recommended annual dental exams for proper oral hygiene  Community Resource Referral / Chronic Care Management: CRR required this visit?  No   CCM required this visit?  No      Plan:     I have personally reviewed and noted the following in the patient's chart:   Medical and social history Use of alcohol, tobacco or illicit drugs  Current medications and supplements including opioid prescriptions. Patient is not currently taking opioid prescriptions. Functional ability and status Nutritional status Physical activity Advanced directives List of other physicians Hospitalizations, surgeries, and ER visits in previous 12 months Vitals Screenings to include cognitive, depression, and falls Referrals and appointments  In addition, I have reviewed and discussed with patient certain preventive protocols, quality metrics, and best practice recommendations. A written personalized care plan for preventive services as well as general preventive health recommendations were provided to patient.     Remi Haggard, LPN   09/05/1749   Nurse Notes:

## 2021-10-04 ENCOUNTER — Ambulatory Visit: Payer: Medicare Other

## 2021-10-07 ENCOUNTER — Other Ambulatory Visit: Payer: Self-pay | Admitting: Family Medicine

## 2021-10-07 DIAGNOSIS — E119 Type 2 diabetes mellitus without complications: Secondary | ICD-10-CM

## 2021-10-07 DIAGNOSIS — G629 Polyneuropathy, unspecified: Secondary | ICD-10-CM

## 2021-10-07 MED ORDER — PANTOPRAZOLE SODIUM 40 MG PO TBEC: 40.0000 mg | DELAYED_RELEASE_TABLET | Freq: Every day | ORAL | 0 refills | Status: DC

## 2021-10-07 MED ORDER — GABAPENTIN 300 MG PO CAPS: 600.0000 mg | ORAL_CAPSULE | Freq: Every day | ORAL | 0 refills | Status: DC

## 2021-10-07 NOTE — Telephone Encounter (Signed)
LOV 07/09/21  Future visit 10/22/21

## 2021-10-14 ENCOUNTER — Telehealth: Payer: Self-pay

## 2021-10-14 MED ORDER — MAGNESIUM OXIDE -MG SUPPLEMENT 200 MG PO TABS: 200.0000 mg | ORAL_TABLET | Freq: Two times a day (BID) | ORAL | 0 refills | Status: DC

## 2021-10-14 NOTE — Telephone Encounter (Signed)
Medication refill for magnesium 200 mg last ov 07/09/21, upcoming ov 10/20/21 . Please advise

## 2021-10-15 ENCOUNTER — Ambulatory Visit: Payer: Medicare Other | Admitting: Unknown Physician Specialty

## 2021-10-15 ENCOUNTER — Other Ambulatory Visit: Payer: Self-pay | Admitting: Family Medicine

## 2021-10-15 NOTE — Telephone Encounter (Signed)
Pharmacy states they do not have Magnesium Oxide -Mg Supplement 200 MG TABS in stock  They only have Magnesium Oxide -Mg Supplement 400 MG TABS  and inquiring if provider wants to do an otc supplement or fill rx w/ 400 mg  Please advise

## 2021-10-15 NOTE — Telephone Encounter (Signed)
Requested medications are due for refill today.  No - too soon  Requested medications are on the active medications list.  yes  Last refill. 07/29/2021 #90 0 rf  Future visit scheduled.   yes  Notes to clinic.  Dr. Neomia Dear pt.    Requested Prescriptions  Pending Prescriptions Disp Refills   losartan (COZAAR) 100 MG tablet [Pharmacy Med Name: Losartan Potassium 100 MG Oral Tablet] 90 tablet 3    Sig: TAKE 1 TABLET BY MOUTH DAILY     Cardiovascular:  Angiotensin Receptor Blockers Failed - 10/15/2021  9:23 AM      Failed - Cr in normal range and within 180 days    Creatinine  Date Value Ref Range Status  09/19/2013 1.25 0.60 - 1.30 mg/dL Final   Creatinine, Ser  Date Value Ref Range Status  08/21/2021 3.23 (H) 0.61 - 1.24 mg/dL Final         Passed - K in normal range and within 180 days    Potassium  Date Value Ref Range Status  08/21/2021 3.5 3.5 - 5.1 mmol/L Final  09/19/2013 3.4 (L) 3.5 - 5.1 mmol/L Final         Passed - Patient is not pregnant      Passed - Last BP in normal range    BP Readings from Last 1 Encounters:  08/21/21 125/87         Passed - Valid encounter within last 6 months    Recent Outpatient Visits           3 months ago Neuropathy   [facility] Vigg, Avanti, MD   6 months ago Neuropathy   [facility] Vigg, Avanti, MD   9 months ago Primary hypertension   [facility] McElwee, Lauren A, NP   1 year ago Prediabetes   [facility] McElwee, Lauren A, NP       Future Appointments             In 1 week Mecum, Dani Gobble, PA-C [employer], [facility]

## 2021-10-16 NOTE — Telephone Encounter (Signed)
Pharmacy was called and informed that the OTC 200 mg supplement for Magnesium was ok. Pharmacy will let the patient know

## 2021-10-22 ENCOUNTER — Ambulatory Visit: Payer: Medicare Other | Admitting: Unknown Physician Specialty

## 2021-10-23 ENCOUNTER — Telehealth: Payer: Medicare Other | Admitting: Physician Assistant

## 2021-10-24 ENCOUNTER — Inpatient Hospital Stay: Payer: Medicare Other | Attending: Oncology

## 2021-10-25 ENCOUNTER — Encounter: Payer: Self-pay | Admitting: Hematology and Oncology

## 2021-10-25 NOTE — Progress Notes (Signed)
error 

## 2021-10-29 ENCOUNTER — Ambulatory Visit: Payer: Medicare Other | Admitting: Nurse Practitioner

## 2021-10-30 ENCOUNTER — Ambulatory Visit: Payer: Medicare Other | Admitting: Nurse Practitioner

## 2021-10-30 NOTE — Progress Notes (Deleted)
There were no vitals taken for this visit.   Subjective:    Patient ID: George Gomez, male    DOB: 1960-11-28, 61 y.o.   MRN: [id number]  HPI: George Gomez is a 61 y.o. male  No chief complaint on file.  HYPERTENSION / HYPERLIPIDEMIA Satisfied with current treatment? {Blank single:19197::"yes","no"} Duration of hypertension: {Blank single:19197::"chronic","months","years"} BP monitoring frequency: {Blank single:19197::"not checking","rarely","daily","weekly","monthly","a few times a day","a few times a week","a few times a month"} BP range:  BP medication side effects: {Blank single:19197::"yes","no"} Past BP meds: {Blank BTDVVOHY:07371::"GGYI","RSWNIOEVOJ","JKKXFGHWEX/HBZJIRCVEL","FYBOFBPZ","WCHENIDPOE","UMPNTIRWER/XVQM","GQQPYPPJKD (bystolic)","carvedilol","chlorthalidone","clonidine","diltiazem","exforge HCT","HCTZ","irbesartan (avapro)","labetalol","lisinopril","lisinopril-HCTZ","losartan (cozaar)","methyldopa","nifedipine","olmesartan (benicar)","olmesartan-HCTZ","quinapril","ramipril","spironalactone","tekturna","valsartan","valsartan-HCTZ","verapamil"} Duration of hyperlipidemia: {Blank single:19197::"chronic","months","years"} Cholesterol medication side effects: {Blank single:19197::"yes","no"} Cholesterol supplements: {Blank multiple:19196::"none","fish oil","niacin","red yeast rice"} Past cholesterol medications: {Blank multiple:19196::"none","atorvastain (lipitor)","lovastatin (mevacor)","pravastatin (pravachol)","rosuvastatin (crestor)","simvastatin (zocor)","vytorin","fenofibrate (tricor)","gemfibrozil","ezetimide (zetia)","niaspan","lovaza"} Medication compliance: {Blank single:19197::"excellent compliance","good compliance","fair compliance","poor compliance"} Aspirin: {Blank single:19197::"yes","no"} Recent stressors: {Blank single:19197::"yes","no"} Recurrent headaches: {Blank single:19197::"yes","no"} Visual changes: {Blank single:19197::"yes","no"} Palpitations:  {Blank single:19197::"yes","no"} Dyspnea: {Blank single:19197::"yes","no"} Chest pain: {Blank single:19197::"yes","no"} Lower extremity edema: {Blank single:19197::"yes","no"} Dizzy/lightheaded: {Blank single:19197::"yes","no"}  DIABETES Hypoglycemic episodes:{Blank single:19197::"yes","no"} Polydipsia/polyuria: {Blank single:19197::"yes","no"} Visual disturbance: {Blank single:19197::"yes","no"} Chest pain: {Blank single:19197::"yes","no"} Paresthesias: {Blank single:19197::"yes","no"} Glucose Monitoring: {Blank single:19197::"yes","no"}  Accucheck frequency: {Blank single:19197::"Not Checking","Daily","BID","TID"}  Fasting glucose:  Post prandial:  Evening:  Before meals: Taking Insulin?: {Blank single:19197::"yes","no"}  Long acting insulin:  Short acting insulin: Blood Pressure Monitoring: {Blank single:19197::"not checking","rarely","daily","weekly","monthly","a few times a day","a few times a week","a few times a month"} Retinal Examination: {Blank single:19197::"Up to Date","Not up to Date"} Foot Exam: {Blank single:19197::"Up to Date","Not up to Date"} Diabetic Education: {Blank single:19197::"Completed","Not Completed"} Pneumovax: {Blank single:19197::"Up to Date","Not up to Date","unknown"} Influenza: {Blank single:19197::"Up to Date","Not up to Date","unknown"} Aspirin: {Blank single:19197::"yes","no"}  ANEMIA Anemia status: {Blank single:19197::"controlled","uncontrolled","better","worse","exacerbated","stable"} Etiology of anemia: Duration of anemia treatment:  Compliance with treatment: {Blank single:19197::"excellent compliance","good compliance","fair compliance","poor compliance"} Iron supplementation side effects: {Blank single:19197::"yes","no"} Severity of anemia: {Blank single:19197::"mild","moderate","severe"} Fatigue: {Blank single:19197::"yes","no"} Decreased exercise tolerance: {Blank single:19197::"yes","no"}  Dyspnea on exertion: {Blank  single:19197::"yes","no"} Palpitations: {Blank single:19197::"yes","no"} Bleeding: {Blank single:19197::"yes","no"} Pica: {Blank single:19197::"yes","no"}  ALCOHOL USE   Relevant past medical, surgical, family and social history reviewed and updated as indicated. Interim medical history since our last visit reviewed. Allergies and medications reviewed and updated.  Review of Systems  Per HPI unless specifically indicated above     Objective:    There were no vitals taken for this visit.  Wt Readings from Last 3 Encounters:  08/21/21 207 lb (93.9 kg)  07/09/21 229 lb (103.9 kg)  05/06/21 228 lb 11.2 oz (103.7 kg)    Physical Exam  Results for orders placed or performed during the hospital encounter of 08/21/21  Lipase, blood  Result Value Ref Range   Lipase 49 11 - 51 U/L  Comprehensive metabolic panel  Result Value Ref Range   Sodium 134 (L) 135 - 145 mmol/L   Potassium 3.5 3.5 - 5.1 mmol/L   Chloride 99 98 - 111 mmol/L   CO2 21 (L) 22 - 32 mmol/L   Glucose, Bld 131 (H) 70 - 99 mg/dL   BUN 27 (H) 8 - 23 mg/dL   Creatinine, Ser 3.23 (H) 0.61 - 1.24 mg/dL   Calcium 8.8 (L) 8.9 - 10.3 mg/dL   Total Protein 7.8 6.5 - 8.1 g/dL   Albumin 3.8 3.5 - 5.0 g/dL   AST 43 (H) 15 - 41 U/L   ALT 27 0 - 44 U/L   Alkaline Phosphatase 88 38 - 126 U/L   Total Bilirubin 1.6 (H) 0.3 - 1.2 mg/dL   GFR, Estimated 21 (L) >  60 mL/min   Anion gap 14 5 - 15  CBC  Result Value Ref Range   WBC 3.6 (L) 4.0 - 10.5 K/uL   RBC 3.31 (L) 4.22 - 5.81 MIL/uL   Hemoglobin 9.5 (L) 13.0 - 17.0 g/dL   HCT 29.2 (L) 39.0 - 52.0 %   MCV 88.2 80.0 - 100.0 fL   MCH 28.7 26.0 - 34.0 pg   MCHC 32.5 30.0 - 36.0 g/dL   RDW 15.0 11.5 - 15.5 %   Platelets 175 150 - 400 K/uL   nRBC 0.0 0.0 - 0.2 %  Urinalysis, Routine w reflex microscopic  Result Value Ref Range   Color, Urine YELLOW (A) YELLOW   APPearance HAZY (A) CLEAR   Specific Gravity, Urine 1.013 1.005 - 1.030   pH 5.0 5.0 - 8.0   Glucose, UA  NEGATIVE NEGATIVE mg/dL   Hgb urine dipstick NEGATIVE NEGATIVE   Bilirubin Urine NEGATIVE NEGATIVE   Ketones, ur 5 (A) NEGATIVE mg/dL   Protein, ur 30 (A) NEGATIVE mg/dL   Nitrite NEGATIVE NEGATIVE   Leukocytes,Ua NEGATIVE NEGATIVE   RBC / HPF 0-5 0 - 5 RBC/hpf   WBC, UA 0-5 0 - 5 WBC/hpf   Bacteria, UA RARE (A) NONE SEEN   Squamous Epithelial / LPF 0-5 0 - 5   Mucus PRESENT    Hyaline Casts, UA PRESENT       Assessment & Plan:   Problem List Items Addressed This Visit       Cardiovascular and Mediastinum   Primary hypertension - Primary     Endocrine   Diabetes mellitus without complication (HCC)     Nervous and Auditory   Neuropathy     Other   Alcohol abuse   Normocytic anemia   B12 deficiency   Prediabetes   Mixed hyperlipidemia     Follow up plan: No follow-ups on file.

## 2021-10-31 ENCOUNTER — Other Ambulatory Visit: Payer: Self-pay

## 2021-10-31 DIAGNOSIS — G629 Polyneuropathy, unspecified: Secondary | ICD-10-CM

## 2021-10-31 DIAGNOSIS — E119 Type 2 diabetes mellitus without complications: Secondary | ICD-10-CM

## 2021-10-31 MED ORDER — ATORVASTATIN CALCIUM 40 MG PO TABS: 40.0000 mg | ORAL_TABLET | Freq: Every day | ORAL | 0 refills | Status: DC

## 2021-10-31 MED ORDER — AMLODIPINE BESYLATE 10 MG PO TABS: 10.0000 mg | ORAL_TABLET | Freq: Every day | ORAL | 0 refills | Status: DC

## 2021-10-31 NOTE — Telephone Encounter (Signed)
Medication refill for Amlodipine 10 mg and Atorvastatin 40mg   last ov 10/29/21, upcoming ov none . Please advise

## 2021-11-01 ENCOUNTER — Ambulatory Visit: Payer: Medicare Other | Admitting: Physician Assistant

## 2021-11-05 ENCOUNTER — Inpatient Hospital Stay: Payer: Medicare Other

## 2021-11-05 ENCOUNTER — Inpatient Hospital Stay: Payer: Medicare Other | Admitting: Oncology

## 2021-11-05 ENCOUNTER — Encounter: Payer: Self-pay | Admitting: Oncology

## 2021-11-26 ENCOUNTER — Inpatient Hospital Stay (HOSPITAL_BASED_OUTPATIENT_CLINIC_OR_DEPARTMENT_OTHER): Payer: Medicare Other | Admitting: Oncology

## 2021-11-26 ENCOUNTER — Encounter: Payer: Self-pay | Admitting: Oncology

## 2021-11-26 ENCOUNTER — Inpatient Hospital Stay: Payer: Medicare Other | Attending: Oncology

## 2021-11-26 DIAGNOSIS — E1122 Type 2 diabetes mellitus with diabetic chronic kidney disease: Secondary | ICD-10-CM | POA: Diagnosis not present

## 2021-11-26 DIAGNOSIS — E538 Deficiency of other specified B group vitamins: Secondary | ICD-10-CM

## 2021-11-26 DIAGNOSIS — I129 Hypertensive chronic kidney disease with stage 1 through stage 4 chronic kidney disease, or unspecified chronic kidney disease: Secondary | ICD-10-CM | POA: Insufficient documentation

## 2021-11-26 DIAGNOSIS — D649 Anemia, unspecified: Secondary | ICD-10-CM | POA: Diagnosis not present

## 2021-11-26 DIAGNOSIS — Z7984 Long term (current) use of oral hypoglycemic drugs: Secondary | ICD-10-CM | POA: Diagnosis not present

## 2021-11-26 DIAGNOSIS — Z79899 Other long term (current) drug therapy: Secondary | ICD-10-CM | POA: Insufficient documentation

## 2021-11-26 DIAGNOSIS — N189 Chronic kidney disease, unspecified: Secondary | ICD-10-CM | POA: Diagnosis not present

## 2021-11-26 LAB — COMPREHENSIVE METABOLIC PANEL
ALT: 24 U/L (ref 0–44)
AST: 52 U/L — ABNORMAL HIGH (ref 15–41)
Albumin: 3.9 g/dL (ref 3.5–5.0)
Alkaline Phosphatase: 95 U/L (ref 38–126)
Anion gap: 9 (ref 5–15)
BUN: 27 mg/dL — ABNORMAL HIGH (ref 8–23)
CO2: 23 mmol/L (ref 22–32)
Calcium: 7.9 mg/dL — ABNORMAL LOW (ref 8.9–10.3)
Chloride: 102 mmol/L (ref 98–111)
Creatinine, Ser: 1.52 mg/dL — ABNORMAL HIGH (ref 0.61–1.24)
GFR, Estimated: 51 mL/min — ABNORMAL LOW (ref >60)
Glucose, Bld: 120 mg/dL — ABNORMAL HIGH (ref 70–99)
Potassium: 4.1 mmol/L (ref 3.5–5.1)
Sodium: 134 mmol/L — ABNORMAL LOW (ref 135–145)
Total Bilirubin: 0.5 mg/dL (ref 0.3–1.2)
Total Protein: 7.8 g/dL (ref 6.5–8.1)

## 2021-11-26 LAB — CBC WITH DIFFERENTIAL/PLATELET
Abs Immature Granulocytes: 0.01 10*3/uL (ref 0.00–0.07)
Basophils Absolute: 0 10*3/uL (ref 0.0–0.1)
Basophils Relative: 1 %
Eosinophils Absolute: 0 10*3/uL (ref 0.0–0.5)
Eosinophils Relative: 1 %
HCT: 29.1 % — ABNORMAL LOW (ref 39.0–52.0)
Hemoglobin: 9.2 g/dL — ABNORMAL LOW (ref 13.0–17.0)
Immature Granulocytes: 0 %
Lymphocytes Relative: 21 %
Lymphs Abs: 0.8 10*3/uL (ref 0.7–4.0)
MCH: 29.5 pg (ref 26.0–34.0)
MCHC: 31.6 g/dL (ref 30.0–36.0)
MCV: 93.3 fL (ref 80.0–100.0)
Monocytes Absolute: 0.5 10*3/uL (ref 0.1–1.0)
Monocytes Relative: 13 %
Neutro Abs: 2.3 10*3/uL (ref 1.7–7.7)
Neutrophils Relative %: 64 %
Platelets: 167 10*3/uL (ref 150–400)
RBC: 3.12 MIL/uL — ABNORMAL LOW (ref 4.22–5.81)
RDW: 17.5 % — ABNORMAL HIGH (ref 11.5–15.5)
WBC: 3.6 10*3/uL — ABNORMAL LOW (ref 4.0–10.5)
nRBC: 0 % (ref 0.0–0.2)

## 2021-11-26 LAB — FERRITIN: Ferritin: 398 ng/mL — ABNORMAL HIGH (ref 24–336)

## 2021-11-26 LAB — IRON AND TIBC
Iron: 65 ug/dL (ref 45–182)
Saturation Ratios: 20 % (ref 17.9–39.5)
TIBC: 326 ug/dL (ref 250–450)
UIBC: 261 ug/dL

## 2021-11-26 LAB — VITAMIN B12: Vitamin B-12: 268 pg/mL (ref 180–914)

## 2021-11-26 NOTE — Progress Notes (Signed)
Hematology/Oncology Consult note [facility]  Telephone:([phone] [phone]  Patient Care Team: Venita Lick, NP as PCP - General (Nurse Practitioner) Lin Landsman, MD as Consulting Physician (Gastroenterology) Sindy Guadeloupe, MD as Consulting Physician (Hematology and Oncology)   Name of the patient: George Gomez  [id number]  23-Jun-1960   Date of visit: 11/26/21  Diagnosis- normocytic anemia likely secondary to chronic disease  Chief complaint/ Reason for visit-routine follow-up of anemia  Heme/Onc history: Patient is a 61 year old African-American male who is transferring his care from Dr. Mike Gip to me.He has been followed for his pancytopenia but mainly anemia.  Hemoglobin has remained around 10 for the last 3 years.  He also has mild intermittent leukopenia with a white count that fluctuates between 3.3-4 mainly neutropenia.  Platelet counts have been mostly normal except for intermittent mild low platelet counts between 120s to 140s.  He has prior history of B12 deficiency and he gets monthly B12 injections.  History of folate deficiency but of late that has been normal.  He had a flow cytometry in December 2020 that was normal.  Anemia work-up including TSH copper zinc ANA hepatitis testing and HIV testing was negative.Patient has had an elevated ferritin which has been again a longstanding issue and his ferritin levels fluctuate between [phone].  He had HFE gene testing done in the past which was negative.LFTs have shown mildly elevated AST and ALT intermittently but no hyperbilirubinemia.MR eneterogram in October 2021 showed normal liver size and configuration.  No splenomegaly.    Interval history-patient has symptoms of chronic neuropathy.  Also reports ongoing fatigue.  ECOG PS- 1 Pain scale- 0 Recently noticed that people were not keeping any candies foot recorded outside at the house we need to keep it in front of her  driveway we need to tell daddy okay he is back tolerated that he needs to put all that in the bowl and keep it on a table in front of our driveway  Review of systems- Review of Systems  Constitutional:  Positive for malaise/fatigue. Negative for chills, fever and weight loss.  HENT:  Negative for congestion, ear discharge and nosebleeds.   Eyes:  Negative for blurred vision.  Respiratory:  Negative for cough, hemoptysis, sputum production, shortness of breath and wheezing.   Cardiovascular:  Negative for chest pain, palpitations, orthopnea and claudication.  Gastrointestinal:  Negative for abdominal pain, blood in stool, constipation, diarrhea, heartburn, melena, nausea and vomiting.  Genitourinary:  Negative for dysuria, flank pain, frequency, hematuria and urgency.  Musculoskeletal:  Negative for back pain, joint pain and myalgias.  Skin:  Negative for rash.  Neurological:  Negative for dizziness, tingling, focal weakness, seizures, weakness and headaches.  Endo/Heme/Allergies:  Does not bruise/bleed easily.  Psychiatric/Behavioral:  Negative for depression and suicidal ideas. The patient does not have insomnia.       No Known Allergies   Past Medical History:  Diagnosis Date   Acute on chronic renal failure ([place]) 03/20/2016   Acute renal failure (ARF) ([place]) 08/28/2016   Acute renal failure ([place]) 10/12/2018   Anemia    B12 deficiency 12/11/2017   Clostridium difficile diarrhea 03/18/2016   Diabetes mellitus without complication ([place])    Diverticulitis    Pt states diverticulitis   EtOH dependence ([place])    Folate deficiency 07/25/2018   GI bleed 05/30/2017   Hypercholesteremia    Hypertension    Melena 10/21/2018   Weight loss 09/15/2018  Past Surgical History:  Procedure Laterality Date   COLONOSCOPY WITH PROPOFOL N/A 10/16/2018   Procedure: COLONOSCOPY WITH PROPOFOL;  Surgeon: Lin Landsman, MD;  Location[facility] ENDOSCOPY;  Service: Gastroenterology;  Laterality: N/A;    ENTEROSCOPY N/A 10/16/2018   Procedure: ENTEROSCOPY;  Surgeon: Lin Landsman, MD;  Location: [facility] ENDOSCOPY;  Service: Gastroenterology;  Laterality: N/A;   ESOPHAGOGASTRODUODENOSCOPY N/A 07/02/2018   Procedure: ESOPHAGOGASTRODUODENOSCOPY (EGD);  Surgeon: Lin Landsman, MD;  Location: [facility] ENDOSCOPY;  Service: Gastroenterology;  Laterality: N/A;   ESOPHAGOGASTRODUODENOSCOPY (EGD) WITH PROPOFOL N/A 06/01/2017   Procedure: ESOPHAGOGASTRODUODENOSCOPY (EGD) WITH PROPOFOL;  Surgeon: Jonathon Bellows, MD;  Location: [facility] ENDOSCOPY;  Service: Gastroenterology;  Laterality: N/A;   GIVENS CAPSULE STUDY N/A 10/17/2018   Procedure: GIVENS CAPSULE STUDY;  Surgeon: Lin Landsman, MD;  Location: [facility] ENDOSCOPY;  Service: Gastroenterology;  Laterality: N/A;   GIVENS CAPSULE STUDY N/A 08/02/2019   Procedure: GIVENS CAPSULE STUDY;  Surgeon: Lin Landsman, MD;  Location: [facility] ENDOSCOPY;  Service: Gastroenterology;  Laterality: N/A;   none      Social History   Socioeconomic History   Marital status: Divorced    Spouse name: Not on file   Number of children: Not on file   Years of education: Not on file   Highest education level: Not on file  Occupational History   Occupation: on disability  Tobacco Use   Smoking status: Never   Smokeless tobacco: Never  Vaping Use   Vaping Use: Never used  Substance and Sexual Activity   Alcohol use: Yes    Comment: on occasion   Drug use: No   Sexual activity: Yes  Other Topics Concern   Not on file  Social History Narrative   Not on file   Social Determinants of Health   Financial Resource Strain: Low Risk  (09/24/2021)   Overall Financial Resource Strain (CARDIA)    Difficulty of Paying Living Expenses: Not hard at all  Food Insecurity: No Food Insecurity (09/24/2021)   Hunger Vital Sign    Worried About Running Out of Food in the Last Year: Never true    [place] in the Last Year: Never true  Transportation Needs: No Transportation  Needs (09/24/2021)   PRAPARE - Hydrologist (Medical): No    Lack of Transportation (Non-Medical): No  Physical Activity: Insufficiently Active (09/24/2021)   Exercise Vital Sign    Days of Exercise per Week: 4 days    Minutes of Exercise per Session: 30 min  Stress: No Stress Concern Present (09/24/2021)   [place]    Feeling of Stress : Only a little  Social Connections: Socially Isolated (09/24/2021)   Social Connection and Isolation Panel [NHANES]    Frequency of Communication with Friends and Family: More than three times a week    Frequency of Social Gatherings with Friends and Family: More than three times a week    Attends Religious Services: Never    Marine scientist or Organizations: No    Attends Archivist Meetings: Never    Marital Status: Divorced  Human resources officer Violence: Not At Risk (09/24/2021)   Humiliation, Afraid, Rape, and Kick questionnaire    Fear of Current or Ex-Partner: No    Emotionally Abused: No    Physically Abused: No    Sexually Abused: No    Family History  Problem Relation Age of Onset   Rheum arthritis  Mother    Diabetes Father    Heart disease Father    Cancer Father    COPD Sister    Rheum arthritis Sister    Diabetes Maternal Grandmother    Cancer Maternal Grandfather    Diabetes Paternal Grandmother    Cancer Paternal Grandfather      Current Outpatient Medications:    allopurinol (ZYLOPRIM) 300 MG tablet, Take 1 tablet (300 mg total) by mouth daily for 15 days. NEEDS APPOINTMENT FOR FURTHER REFILLS, Disp: 15 tablet, Rfl: 0   amLODipine (NORVASC) 10 MG tablet, Take 1 tablet (10 mg total) by mouth daily., Disp: 90 tablet, Rfl: 0   atorvastatin (LIPITOR) 40 MG tablet, Take 1 tablet (40 mg total) by mouth daily., Disp: 90 tablet, Rfl: 0   gabapentin (NEURONTIN) 300 MG capsule, Take 2 capsules (600 mg total) by mouth at bedtime  for 15 days. NEEDS APPOINTMENT FOR FURTHER REFILLS, Disp: 30 capsule, Rfl: 0   losartan (COZAAR) 100 MG tablet, Take 1 tablet (100 mg total) by mouth daily., Disp: 90 tablet, Rfl: 0   metFORMIN (GLUCOPHAGE-XR) 500 MG 24 hr tablet, Take 1 tablet (500 mg total) by mouth daily., Disp: 90 tablet, Rfl: 1   Multiple Vitamin (MULTIVITAMIN ADULT PO), Gnp Century Mature Multivitamin Tab 125c, Disp: , Rfl:    pantoprazole (PROTONIX) 40 MG tablet, Take 1 tablet (40 mg total) by mouth daily for 15 days. NEEDS APPOINTMENT FOR FURTHER REFILLS, Disp: 15 tablet, Rfl: 0   propranolol ER (INDERAL LA) 60 MG 24 hr capsule, Take 1 capsule (60 mg total) by mouth daily. needs appointment for further refills, Disp: 90 capsule, Rfl: 1   triamcinolone cream (KENALOG) 0.1 %, Apply 1 application topically 2 (two) times daily., Disp: 453.6 g, Rfl: 0   TYLENOL 500 MG tablet, Take 1,000 mg by mouth every 8 (eight) hours as needed., Disp: , Rfl:    VOLTAREN 1 % GEL, Apply 4 gram to affected area four times a day as needed for pain for knee pain. Do not exceed 16gm for LE joint per day, Disp: , Rfl:    loratadine (CLARITIN) 10 MG tablet, Take 10 mg by mouth daily. (Patient not taking: Reported on 11/26/2021), Disp: , Rfl:    Magnesium Oxide -Mg Supplement 200 MG TABS, Take 1 tablet (200 mg total) by mouth 2 (two) times daily. (Patient not taking: Reported on 11/26/2021), Disp: 30 tablet, Rfl: 0   ondansetron (ZOFRAN-ODT) 4 MG disintegrating tablet, Take 1 tablet (4 mg total) by mouth every 8 (eight) hours as needed for nausea or vomiting. (Patient not taking: Reported on 11/26/2021), Disp: 20 tablet, Rfl: 0  Physical exam:  Physical Exam Constitutional:      General: He is not in acute distress. Cardiovascular:     Rate and Rhythm: Normal rate and regular rhythm.     Heart sounds: Normal heart sounds.  Pulmonary:     Effort: Pulmonary effort is normal.     Breath sounds: Normal breath sounds.  Skin:    General: Skin is warm  and dry.  Neurological:     Mental Status: He is alert and oriented to person, place, and time.         Latest Ref Rng & Units 11/26/2021   10:58 AM  CMP  Glucose 70 - 99 mg/dL 120   BUN 8 - 23 mg/dL 27   Creatinine 0.61 - 1.24 mg/dL 1.52   Sodium 135 - 145 mmol/L 134   Potassium 3.5 - 5.1  mmol/L 4.1   Chloride 98 - 111 mmol/L 102   CO2 22 - 32 mmol/L 23   Calcium 8.9 - 10.3 mg/dL 7.9   Total Protein 6.5 - 8.1 g/dL 7.8   Total Bilirubin 0.3 - 1.2 mg/dL 0.5   Alkaline Phos 38 - 126 U/L 95   AST 15 - 41 U/L 52   ALT 0 - 44 U/L 24       Latest Ref Rng & Units 11/26/2021   10:58 AM  CBC  WBC 4.0 - 10.5 K/uL 3.6   Hemoglobin 13.0 - 17.0 g/dL 9.2   Hematocrit 39.0 - 52.0 % 29.1   Platelets 150 - 400 K/uL 167      Assessment and plan- Patient is a 61 y.o. male here for routine follow-up of anemia  Patient's baseline hemoglobin runs between 10-11.  Over the last 3 to 4 months it has been between 9-9.5.  Iron studies are presently normal.  He does have some element of CKD given that his creatinine was elevated at 1.5 although better as compared to 3.2 in July 2023.  I will plan to send him to nephrology to evaluate him for possible CKD.  At this point I am holding off on starting any Retacrit for CKD.  We will check CBC ferritin and iron studies in 3 in 6 months and I will see him back in 6 months   Visit Diagnosis 1. B12 deficiency   2. Normocytic anemia      Dr. Randa Evens, MD, MPH [facility] [id number] 11/26/2021 4:11 PM

## 2021-11-26 NOTE — Progress Notes (Signed)
Pt will like to be referred back to neurology due to not being able to stand for a long period of time and will like to resume PT.

## 2021-12-15 NOTE — Patient Instructions (Incomplete)
Hip Pain The hip is the joint between the upper legs and the lower pelvis. The bones, cartilage, tendons, and muscles of your hip joint support your body and allow you to move around. Hip pain can range from a minor ache to severe pain in one or both of your hips. The pain may be felt on the inside of the hip joint near the groin, or on the outside near the buttocks and upper thigh. You may also have swelling or stiffness in your hip area. Follow these instructions at home: Managing pain, stiffness, and swelling     If directed, put ice on the painful area. To do this: Put ice in a plastic bag. Place a towel between your skin and the bag. Leave the ice on for 20 minutes, 2-3 times a day. If directed, apply heat to the affected area as often as told by your health care provider. Use the heat source that your health care provider recommends, such as a moist heat pack or a heating pad. Place a towel between your skin and the heat source. Leave the heat on for 20-30 minutes. Remove the heat if your skin turns bright red. This is especially important if you are unable to feel pain, heat, or cold. You may have a greater risk of getting burned. Activity Do exercises as told by your health care provider. Avoid activities that cause pain. General instructions  Take over-the-counter and prescription medicines only as told by your health care provider. Keep a journal of your symptoms. Write down: How often you have hip pain. The location of your pain. What the pain feels like. What makes the pain worse. Sleep with a pillow between your legs on your most comfortable side. Keep all follow-up visits as told by your health care provider. This is important. Contact a health care provider if: You cannot put weight on your leg. Your pain or swelling continues or gets worse after one week. It gets harder to walk. You have a fever. Get help right away if: You fall. You have a sudden increase in pain  and swelling in your hip. Your hip is red or swollen or very tender to touch. Summary Hip pain can range from a minor ache to severe pain in one or both of your hips. The pain may be felt on the inside of the hip joint near the groin, or on the outside near the buttocks and upper thigh. Avoid activities that cause pain. Write down how often you have hip pain, the location of the pain, what makes it worse, and what it feels like. This information is not intended to replace advice given to you by your health care provider. Make sure you discuss any questions you have with your health care provider. Document Revised: 05/31/2018 Document Reviewed: 05/31/2018 Elsevier Patient Education  2023 [employer]  

## 2021-12-18 ENCOUNTER — Ambulatory Visit: Payer: Medicare Other | Admitting: Nurse Practitioner

## 2021-12-18 DIAGNOSIS — I152 Hypertension secondary to endocrine disorders: Secondary | ICD-10-CM

## 2021-12-18 DIAGNOSIS — N400 Enlarged prostate without lower urinary tract symptoms: Secondary | ICD-10-CM

## 2021-12-18 DIAGNOSIS — D649 Anemia, unspecified: Secondary | ICD-10-CM

## 2021-12-18 DIAGNOSIS — E1169 Type 2 diabetes mellitus with other specified complication: Secondary | ICD-10-CM

## 2021-12-18 DIAGNOSIS — R7989 Other specified abnormal findings of blood chemistry: Secondary | ICD-10-CM

## 2021-12-18 DIAGNOSIS — E559 Vitamin D deficiency, unspecified: Secondary | ICD-10-CM

## 2021-12-18 DIAGNOSIS — Z23 Encounter for immunization: Secondary | ICD-10-CM

## 2021-12-18 DIAGNOSIS — E538 Deficiency of other specified B group vitamins: Secondary | ICD-10-CM

## 2021-12-18 DIAGNOSIS — K219 Gastro-esophageal reflux disease without esophagitis: Secondary | ICD-10-CM

## 2021-12-18 DIAGNOSIS — D696 Thrombocytopenia, unspecified: Secondary | ICD-10-CM

## 2021-12-18 DIAGNOSIS — F1010 Alcohol abuse, uncomplicated: Secondary | ICD-10-CM

## 2021-12-18 DIAGNOSIS — E669 Obesity, unspecified: Secondary | ICD-10-CM

## 2021-12-18 DIAGNOSIS — M1A0710 Idiopathic chronic gout, right ankle and foot, without tophus (tophi): Secondary | ICD-10-CM

## 2021-12-18 DIAGNOSIS — E1140 Type 2 diabetes mellitus with diabetic neuropathy, unspecified: Secondary | ICD-10-CM

## 2021-12-29 NOTE — Patient Instructions (Signed)
Hip Pain The hip is the joint between the upper legs and the lower pelvis. The bones, cartilage, tendons, and muscles of your hip joint support your body and allow you to move around. Hip pain can range from a minor ache to severe pain in one or both of your hips. The pain may be felt on the inside of the hip joint near the groin, or on the outside near the buttocks and upper thigh. You may also have swelling or stiffness in your hip area. Follow these instructions at home: Managing pain, stiffness, and swelling     If directed, put ice on the painful area. To do this: Put ice in a plastic bag. Place a towel between your skin and the bag. Leave the ice on for 20 minutes, 2-3 times a day. If directed, apply heat to the affected area as often as told by your health care provider. Use the heat source that your health care provider recommends, such as a moist heat pack or a heating pad. Place a towel between your skin and the heat source. Leave the heat on for 20-30 minutes. Remove the heat if your skin turns bright red. This is especially important if you are unable to feel pain, heat, or cold. You may have a greater risk of getting burned. Activity Do exercises as told by your health care provider. Avoid activities that cause pain. General instructions  Take over-the-counter and prescription medicines only as told by your health care provider. Keep a journal of your symptoms. Write down: How often you have hip pain. The location of your pain. What the pain feels like. What makes the pain worse. Sleep with a pillow between your legs on your most comfortable side. Keep all follow-up visits as told by your health care provider. This is important. Contact a health care provider if: You cannot put weight on your leg. Your pain or swelling continues or gets worse after one week. It gets harder to walk. You have a fever. Get help right away if: You fall. You have a sudden increase in pain  and swelling in your hip. Your hip is red or swollen or very tender to touch. Summary Hip pain can range from a minor ache to severe pain in one or both of your hips. The pain may be felt on the inside of the hip joint near the groin, or on the outside near the buttocks and upper thigh. Avoid activities that cause pain. Write down how often you have hip pain, the location of the pain, what makes it worse, and what it feels like. This information is not intended to replace advice given to you by your health care provider. Make sure you discuss any questions you have with your health care provider. Document Revised: 05/31/2018 Document Reviewed: 05/31/2018 Elsevier Patient Education  2023 Elsevier Inc.  

## 2022-01-01 ENCOUNTER — Encounter: Payer: Self-pay | Admitting: Nurse Practitioner

## 2022-01-01 ENCOUNTER — Ambulatory Visit (INDEPENDENT_AMBULATORY_CARE_PROVIDER_SITE_OTHER): Payer: Medicare Other | Admitting: Nurse Practitioner

## 2022-01-01 VITALS — BP 115/74 | HR 96 | Temp 97.9°F | Ht 70.98 in | Wt 218.1 lb

## 2022-01-01 DIAGNOSIS — E785 Hyperlipidemia, unspecified: Secondary | ICD-10-CM

## 2022-01-01 DIAGNOSIS — F1010 Alcohol abuse, uncomplicated: Secondary | ICD-10-CM

## 2022-01-01 DIAGNOSIS — E1159 Type 2 diabetes mellitus with other circulatory complications: Secondary | ICD-10-CM

## 2022-01-01 DIAGNOSIS — M1A0610 Idiopathic chronic gout, right knee, without tophus (tophi): Secondary | ICD-10-CM

## 2022-01-01 DIAGNOSIS — N400 Enlarged prostate without lower urinary tract symptoms: Secondary | ICD-10-CM

## 2022-01-01 DIAGNOSIS — E1122 Type 2 diabetes mellitus with diabetic chronic kidney disease: Secondary | ICD-10-CM

## 2022-01-01 DIAGNOSIS — D696 Thrombocytopenia, unspecified: Secondary | ICD-10-CM | POA: Diagnosis not present

## 2022-01-01 DIAGNOSIS — R809 Proteinuria, unspecified: Secondary | ICD-10-CM

## 2022-01-01 DIAGNOSIS — I152 Hypertension secondary to endocrine disorders: Secondary | ICD-10-CM | POA: Diagnosis not present

## 2022-01-01 DIAGNOSIS — M79642 Pain in left hand: Secondary | ICD-10-CM

## 2022-01-01 DIAGNOSIS — E1129 Type 2 diabetes mellitus with other diabetic kidney complication: Secondary | ICD-10-CM

## 2022-01-01 DIAGNOSIS — E1140 Type 2 diabetes mellitus with diabetic neuropathy, unspecified: Secondary | ICD-10-CM

## 2022-01-01 DIAGNOSIS — D649 Anemia, unspecified: Secondary | ICD-10-CM

## 2022-01-01 DIAGNOSIS — E1169 Type 2 diabetes mellitus with other specified complication: Secondary | ICD-10-CM

## 2022-01-01 DIAGNOSIS — E538 Deficiency of other specified B group vitamins: Secondary | ICD-10-CM

## 2022-01-01 DIAGNOSIS — R7989 Other specified abnormal findings of blood chemistry: Secondary | ICD-10-CM

## 2022-01-01 DIAGNOSIS — K219 Gastro-esophageal reflux disease without esophagitis: Secondary | ICD-10-CM

## 2022-01-01 DIAGNOSIS — M79641 Pain in right hand: Secondary | ICD-10-CM | POA: Insufficient documentation

## 2022-01-01 DIAGNOSIS — Z23 Encounter for immunization: Secondary | ICD-10-CM

## 2022-01-01 DIAGNOSIS — N183 Chronic kidney disease, stage 3 unspecified: Secondary | ICD-10-CM | POA: Insufficient documentation

## 2022-01-01 DIAGNOSIS — M25551 Pain in right hip: Secondary | ICD-10-CM

## 2022-01-01 DIAGNOSIS — E669 Obesity, unspecified: Secondary | ICD-10-CM

## 2022-01-01 LAB — MICROALBUMIN, URINE WAIVED
Creatinine, Urine Waived: 100 mg/dL (ref 10–300)
Microalb, Ur Waived: 80 mg/L — ABNORMAL HIGH (ref 0–19)

## 2022-01-01 LAB — BAYER DCA HB A1C WAIVED: HB A1C (BAYER DCA - WAIVED): 6 % — ABNORMAL HIGH (ref 4.8–5.6)

## 2022-01-01 MED ORDER — AMLODIPINE BESYLATE 10 MG PO TABS: 10.0000 mg | ORAL_TABLET | Freq: Every day | ORAL | 4 refills | Status: DC

## 2022-01-01 MED ORDER — GABAPENTIN 300 MG PO CAPS: 600.0000 mg | ORAL_CAPSULE | Freq: Every day | ORAL | 4 refills | Status: DC

## 2022-01-01 MED ORDER — PANTOPRAZOLE SODIUM 40 MG PO TBEC: 40.0000 mg | DELAYED_RELEASE_TABLET | Freq: Every day | ORAL | 4 refills | Status: DC

## 2022-01-01 MED ORDER — ATORVASTATIN CALCIUM 40 MG PO TABS: 40.0000 mg | ORAL_TABLET | Freq: Every day | ORAL | 4 refills | Status: DC

## 2022-01-01 MED ORDER — METFORMIN HCL ER 500 MG PO TB24: 500.0000 mg | ORAL_TABLET | Freq: Every day | ORAL | 4 refills | Status: DC

## 2022-01-01 MED ORDER — PROPRANOLOL HCL ER 60 MG PO CP24: 60.0000 mg | ORAL_CAPSULE | Freq: Every day | ORAL | 4 refills | Status: DC

## 2022-01-01 MED ORDER — ALLOPURINOL 300 MG PO TABS: 300.0000 mg | ORAL_TABLET | Freq: Every day | ORAL | 4 refills | Status: DC

## 2022-01-01 NOTE — Assessment & Plan Note (Signed)
Chronic, ongoing.  Followed by hematology, recent notes reviewed and labs. 

## 2022-01-01 NOTE — Assessment & Plan Note (Signed)
Chronic, ongoing.  Followed by hematology and receiving shots, continue this collaboration.

## 2022-01-01 NOTE — Assessment & Plan Note (Signed)
Chronic, stable with no recent flares.  Continue Allopurinol and renal dose as needed.  Labs today.

## 2022-01-01 NOTE — Progress Notes (Signed)
BP 115/74   Pulse 96   Temp 97.9 F (36.6 C) (Oral)   Ht 5' 10.98" (1.803 m)   Wt 218 lb 1.6 oz (98.9 kg)   SpO2 98%   BMI 30.43 kg/m    Subjective:    Patient ID: George Gomez, male    DOB: [birth year], 61 y.o.   MRN: [id number]  HPI: George Gomez is a 61 y.o. male  Chief Complaint  Patient presents with   Hand Pain    B/l hand pain, patient states it has been going on for the past 2 to 3 months. States that the finger tips are numb and fell like fire is going through them.    Hip Pain   DIABETES Continues on Metformin XL 500 MG daily.  Last A1c was 5.8%, June.   Hypoglycemic episodes:no Polydipsia/polyuria: no Visual disturbance: no Chest pain: no Paresthesias: no Glucose Monitoring: no  Accucheck frequency: Not Checking  Fasting glucose:  Post prandial:  Evening:  Before meals: Taking Insulin?: no  Long acting insulin:  Short acting insulin: Blood Pressure Monitoring: not checking Retinal Examination: Not up to Date -- needs to schedule Foot Exam: Up to Date Pneumovax: next visit will obtain Influenza: Up to Date Aspirin: no   CHRONIC KIDNEY DISEASE Seen on recent labs. CKD status: stable Medications renally dose: yes Previous renal evaluation: no Pneumovax:  Not up to Date Influenza Vaccine:  Up to Date   HYPERTENSION / HYPERLIPIDEMIA Continues on Amlodipine, Losartan, Propranolol, + Atorvastatin. Satisfied with current treatment? yes Duration of hypertension: chronic BP monitoring frequency: weekly BP range: 110/70 range BP medication side effects: no Duration of hyperlipidemia: chronic Cholesterol medication side effects: no Cholesterol supplements: none Medication compliance: good compliance Aspirin: no Recent stressors: no Recurrent headaches: no Visual changes: no Palpitations: no Dyspnea: no Chest pain: no Lower extremity edema: occasional Dizzy/lightheaded: occasional  GOUT No recent flares, takes Allopurinol daily.  Drinks a  shot or two of liquor daily -- vodka. Have noticed elevation in LFTs on labs in past. Duration:chronic Right 1st metatarsophalangeal pain: no Left 1st metatarsophalangeal pain: no Swelling: no Redness: no Trauma: no Recent dietary change or indiscretion: no Fevers: no Nausea/vomiting: no Aggravating factors: foods Alleviating factors: Allopurinol Status:  stable Treatments attempted: Allopurinol    ANEMIA Followed by hematology for low B12 and anemia.  Last visit 11/26/21.  Obtains B12 shots from them. Anemia status: stable Etiology of anemia: B12 and iron deficiency Duration of anemia treatment: via hematology Compliance with treatment: good compliance Iron supplementation side effects: no Severity of anemia: moderate Fatigue: no Decreased exercise tolerance: no  Dyspnea on exertion: no Palpitations: no Bleeding: no Pica: no   HIP & HAND PAIN Has ongoing right hip pain, feels this worsening.  Has seen ortho in past, last visit 04/26/21, did physical therapy in past.  Having bilateral hand pain for over a year -- notices this most when driving and pinky finger will go numb and be painful.  Imaging done at [facility] - noted lumbar arthritis and right hip arthritis. Duration: chronic Involved hip: right  Mechanism of injury: unknown Location: diffuse Onset: gradual  Severity: 9/10  Quality: dull, aching, and throbbing Frequency: intermittent Radiation: no Aggravating factors: weight bearing and movement   Alleviating factors: Gabapentin  Status: stable Treatments attempted: Gabapentin, Ibuprofen   Relief with NSAIDs?: No NSAIDs Taken Weakness with weight bearing: yes Weakness with walking: yes Paresthesias / decreased sensation: yes Swelling: no Redness:no Fevers: no   Relevant past medical,  surgical, family and social history reviewed and updated as indicated. Interim medical history since our last visit reviewed. Allergies and medications reviewed and  updated.  Review of Systems  Constitutional:  Negative for activity change, diaphoresis, fatigue and fever.  Respiratory:  Negative for cough, chest tightness, shortness of breath and wheezing.   Cardiovascular:  Negative for chest pain, palpitations and leg swelling.  Endocrine: Negative for cold intolerance, heat intolerance, polydipsia, polyphagia and polyuria.  Musculoskeletal:  Positive for arthralgias.  Neurological:  Negative for dizziness, syncope, weakness, light-headedness, numbness and headaches.  Psychiatric/Behavioral: Negative.      Per HPI unless specifically indicated above     Objective:    BP 115/74   Pulse 96   Temp 97.9 F (36.6 C) (Oral)   Ht 5' 10.98" (1.803 m)   Wt 218 lb 1.6 oz (98.9 kg)   SpO2 98%   BMI 30.43 kg/m   Wt Readings from Last 3 Encounters:  01/01/22 218 lb 1.6 oz (98.9 kg)  08/21/21 207 lb (93.9 kg)  07/09/21 229 lb (103.9 kg)    Physical Exam Vitals and nursing note reviewed.  Constitutional:      General: He is awake. He is not in acute distress.    Appearance: He is well-developed and well-groomed. He is obese. He is not ill-appearing or toxic-appearing.  HENT:     Head: Normocephalic and atraumatic.     Right Ear: Hearing and external ear normal. No drainage.     Left Ear: Hearing and external ear normal. No drainage.  Eyes:     General: Lids are normal.        Right eye: No discharge.        Left eye: No discharge.     Conjunctiva/sclera: Conjunctivae normal.     Pupils: Pupils are equal, round, and reactive to light.  Neck:     Thyroid: No thyromegaly.     Vascular: No carotid bruit.  Cardiovascular:     Rate and Rhythm: Normal rate and regular rhythm.     Heart sounds: Normal heart sounds, S1 normal and S2 normal. No murmur heard.    No gallop.  Pulmonary:     Effort: Pulmonary effort is normal. No accessory muscle usage or respiratory distress.     Breath sounds: Normal breath sounds.  Abdominal:     General: Bowel  sounds are normal.     Palpations: Abdomen is soft.  Musculoskeletal:     Right hand: No swelling or tenderness. Normal range of motion. Decreased strength of finger abduction. Normal sensation. Normal capillary refill.     Left hand: No swelling or tenderness. Normal range of motion. Decreased strength of finger abduction. Normal sensation. Normal capillary refill.     Cervical back: Normal range of motion and neck supple.     Lumbar back: No swelling or tenderness. Normal range of motion. Negative right straight leg raise test and negative left straight leg raise test.     Right hip: Tenderness and crepitus present. No bony tenderness. Decreased range of motion. Decreased strength.     Left hip: Normal.     Right lower leg: No edema.     Left lower leg: No edema.  Lymphadenopathy:     Cervical: No cervical adenopathy.  Skin:    General: Skin is warm and dry.     Capillary Refill: Capillary refill takes less than 2 seconds.     Findings: No rash.  Neurological:     Mental Status:  He is alert and oriented to person, place, and time.     Cranial Nerves: Cranial nerves 2-12 are intact.     Sensory: Sensation is intact.     Coordination: Coordination is intact.     Gait: Gait abnormal (antalgic gait present).     Deep Tendon Reflexes: Reflexes are normal and symmetric.     Reflex Scores:      Brachioradialis reflexes are 2+ on the right side and 2+ on the left side.      Patellar reflexes are 2+ on the right side and 2+ on the left side. Psychiatric:        Attention and Perception: Attention normal.        Mood and Affect: Mood normal.        Speech: Speech normal.        Behavior: Behavior normal. Behavior is cooperative.        Thought Content: Thought content normal.     Results for orders placed or performed in visit on 01/01/22  Bayer DCA Hb A1c Waived  Result Value Ref Range   HB A1C (BAYER DCA - WAIVED) 6.0 (H) 4.8 - 5.6 %  Microalbumin, Urine Waived  Result Value Ref  Range   Microalb, Ur Waived 80 (H) 0 - 19 mg/L   Creatinine, Urine Waived 100 10 - 300 mg/dL   Microalb/Creat Ratio 30-300 (H) <30 mg/g      Assessment & Plan:   Problem List Items Addressed This Visit       Cardiovascular and Mediastinum   Hypertension associated with type 2 diabetes mellitus (HCC)    Chronic, stable with BP at goal in office.  Recommend he monitor BP at least a few mornings a week at home and document.  DASH diet at home.  Continue current medication regimen and adjust as needed.  Labs today: CBC, CMP, TSH, urine ALB.  Refills sent.  Return in 3 months.        Relevant Medications   propranolol ER (INDERAL LA) 60 MG 24 hr capsule   metFORMIN (GLUCOPHAGE-XR) 500 MG 24 hr tablet   atorvastatin (LIPITOR) 40 MG tablet   amLODipine (NORVASC) 10 MG tablet   Other Relevant Orders   Bayer DCA Hb A1c Waived (Completed)   Comprehensive metabolic panel   TSH   Microalbumin, Urine Waived (Completed)     Digestive   Gastroesophageal reflux disease    Chronic, ongoing with recent low mag level.  Recheck today and adjust regimen as needed.  Risks of PPI use were discussed with patient including bone loss, C. Diff diarrhea, pneumonia, infections, CKD, electrolyte abnormalities.  Verbalizes understanding and chooses to continue the medication.       Relevant Medications   pantoprazole (PROTONIX) 40 MG tablet   Other Relevant Orders   Magnesium     Endocrine   CKD stage 3 due to type 2 diabetes mellitus (HCC)    Chronic, ongoing with A1c 6% today.  At this time continue Metformin as ordered and monitor closely, renal dose as needed.  Urine PRT 80 (December 2023), continue Losartan for kidney protection.  Is to schedule with nephrology per referral from hematology, he plans to call.  Recommend he check BS 3 days a week and document for provider visits.  Return in 3 months.      Relevant Medications   metFORMIN (GLUCOPHAGE-XR) 500 MG 24 hr tablet   atorvastatin (LIPITOR)  40 MG tablet   Hyperlipidemia due to type 2 diabetes  mellitus (HCC)    Chronic, ongoing.  Continue current medication regimen and adjust as needed.  Lipid panel today.  Refills sent in.         Relevant Medications   propranolol ER (INDERAL LA) 60 MG 24 hr capsule   metFORMIN (GLUCOPHAGE-XR) 500 MG 24 hr tablet   atorvastatin (LIPITOR) 40 MG tablet   amLODipine (NORVASC) 10 MG tablet   Other Relevant Orders   Bayer DCA Hb A1c Waived (Completed)   Comprehensive metabolic panel   Lipid Panel w/o Chol/HDL Ratio   Type 2 diabetes mellitus with diabetic neuropathy (HCC)    Chronic, ongoing with A1c 6% today.  At this time continue Metformin as ordered and monitor closely, renal dose as needed.  Urine PRT 80 (December 2023), continue Losartan for kidney protection.  Continue Gabapentin for neuropathy pain.  Recommend he check BS 3 days a week and document for provider visits.  Return in 3 months.      Relevant Medications   gabapentin (NEURONTIN) 300 MG capsule   metFORMIN (GLUCOPHAGE-XR) 500 MG 24 hr tablet   atorvastatin (LIPITOR) 40 MG tablet   amLODipine (NORVASC) 10 MG tablet   Other Relevant Orders   Bayer DCA Hb A1c Waived (Completed)   Comprehensive metabolic panel   Microalbumin, Urine Waived (Completed)   Type 2 diabetes mellitus with proteinuria (HCC) - Primary    Chronic, ongoing with A1c 6% today.  At this time continue Metformin as ordered and monitor closely, renal dose as needed.  Urine PRT 80 (December 2023), continue Losartan for kidney protection -- CKD noted on recent labs and is to schedule with nephrology per hematology referral.  Continue Gabapentin for neuropathy pain.  Recommend he check BS 3 days a week and document for provider visits.  Return in 3 months.      Relevant Medications   metFORMIN (GLUCOPHAGE-XR) 500 MG 24 hr tablet   atorvastatin (LIPITOR) 40 MG tablet   amLODipine (NORVASC) 10 MG tablet     Hematopoietic and Hemostatic   Thrombocytopenia (HCC)     Chronic, ongoing.  Followed by hematology and recent notes reviewed.        Other   Alcohol abuse    Chronic, he reports a few shots vodka daily.  Check CMP today and consider imaging if elevations.  Recommend working towards complete cessation of alcohol use.      Relevant Orders   Comprehensive metabolic panel   I09 deficiency    Chronic, ongoing.  Followed by hematology and receiving shots, continue this collaboration.      Bilateral hand pain    Chronic, bilateral and ongoing.  Suspect some ulnar tunnel present.  Referral to ortho and continue Gabapentin.      Relevant Orders   Ambulatory referral to Orthopedics   Chronic gout without tophus    Chronic, stable with no recent flares.  Continue Allopurinol and renal dose as needed.  Labs today.      Relevant Medications   allopurinol (ZYLOPRIM) 300 MG tablet   Elevated LFTs    Recheck on labs today and consider imaging in future if elevations trend up.  Recommend cut back on alcohol use.      Relevant Orders   Comprehensive metabolic panel   Hypomagnesemia    Recheck on labs today, low on recent check.      Relevant Orders   Magnesium   Normocytic anemia    Chronic, ongoing.  Followed by hematology, recent notes reviewed and labs.  Obesity (BMI 30-39.9)    BMI 30.43.  Recommended eating smaller high protein, low fat meals more frequently and exercising 30 mins a day 5 times a week with a goal of 10-15lb weight loss in the next 3 months. Patient voiced their understanding and motivation to adhere to these recommendations.       Relevant Medications   metFORMIN (GLUCOPHAGE-XR) 500 MG 24 hr tablet   Right hip pain    Chronic, ongoing.  Referral to return to ortho for further recommendations placed.      Relevant Orders   Ambulatory referral to Orthopedics   Other Visit Diagnoses     Benign prostatic hyperplasia without lower urinary tract symptoms       Relevant Orders   PSA   Flu vaccine need        Flu vaccine in office today.   Relevant Orders   Flu Vaccine QUAD 6+ mos PF IM (Fluarix Quad PF) (Completed)        Follow up plan: Return in about 3 months (around 04/02/2022) for T2DM, HTN/HLD, ANEMIA, CKD.

## 2022-01-01 NOTE — Assessment & Plan Note (Signed)
Chronic, ongoing with A1c 6% today.  At this time continue Metformin as ordered and monitor closely, renal dose as needed.  Urine PRT 80 (December 2023), continue Losartan for kidney protection.  Continue Gabapentin for neuropathy pain.  Recommend he check BS 3 days a week and document for provider visits.  Return in 3 months.

## 2022-01-01 NOTE — Assessment & Plan Note (Addendum)
Chronic, stable with BP at goal in office.  Recommend he monitor BP at least a few mornings a week at home and document.  DASH diet at home.  Continue current medication regimen and adjust as needed.  Labs today: CBC, CMP, TSH, urine ALB.  Refills sent.  Return in 3 months.

## 2022-01-01 NOTE — Assessment & Plan Note (Signed)
Chronic, he reports a few shots vodka daily.  Check CMP today and consider imaging if elevations.  Recommend working towards complete cessation of alcohol use. 

## 2022-01-01 NOTE — Assessment & Plan Note (Signed)
Chronic, ongoing.  Followed by hematology and recent notes reviewed. 

## 2022-01-01 NOTE — Assessment & Plan Note (Signed)
Chronic, ongoing.  Referral to return to ortho for further recommendations placed. 

## 2022-01-01 NOTE — Assessment & Plan Note (Signed)
Chronic, ongoing.  Continue current medication regimen and adjust as needed.  Lipid panel today.  Refills sent in. 

## 2022-01-01 NOTE — Assessment & Plan Note (Signed)
Chronic, ongoing with A1c 6% today.  At this time continue Metformin as ordered and monitor closely, renal dose as needed.  Urine PRT 80 (December 2023), continue Losartan for kidney protection -- CKD noted on recent labs and is to schedule with nephrology per hematology referral.  Continue Gabapentin for neuropathy pain.  Recommend he check BS 3 days a week and document for provider visits.  Return in 3 months.

## 2022-01-01 NOTE — Assessment & Plan Note (Signed)
Recheck on labs today and consider imaging in future if elevations trend up.  Recommend cut back on alcohol use. 

## 2022-01-01 NOTE — Assessment & Plan Note (Signed)
Chronic, bilateral and ongoing.  Suspect some ulnar tunnel present.  Referral to ortho and continue Gabapentin.

## 2022-01-01 NOTE — Assessment & Plan Note (Signed)
Chronic, ongoing with A1c 6% today.  At this time continue Metformin as ordered and monitor closely, renal dose as needed.  Urine PRT 80 (December 2023), continue Losartan for kidney protection.  Is to schedule with nephrology per referral from hematology, he plans to call.  Recommend he check BS 3 days a week and document for provider visits.  Return in 3 months.

## 2022-01-01 NOTE — Assessment & Plan Note (Signed)
BMI 30.43.  Recommended eating smaller high protein, low fat meals more frequently and exercising 30 mins a day 5 times a week with a goal of 10-15lb weight loss in the next 3 months. Patient voiced their understanding and motivation to adhere to these recommendations. ° °

## 2022-01-01 NOTE — Assessment & Plan Note (Signed)
Chronic, ongoing with recent low mag level.  Recheck today and adjust regimen as needed.  Risks of PPI use were discussed with patient including bone loss, C. Diff diarrhea, pneumonia, infections, CKD, electrolyte abnormalities.  Verbalizes understanding and chooses to continue the medication.

## 2022-01-01 NOTE — Assessment & Plan Note (Signed)
Recheck on labs today, low on recent check.

## 2022-01-02 ENCOUNTER — Telehealth: Payer: Self-pay

## 2022-01-02 ENCOUNTER — Other Ambulatory Visit: Payer: Self-pay | Admitting: Nurse Practitioner

## 2022-01-02 LAB — COMPREHENSIVE METABOLIC PANEL
ALT: 27 IU/L (ref 0–44)
AST: 32 IU/L (ref 0–40)
Albumin/Globulin Ratio: 1.4 (ref 1.2–2.2)
Albumin: 4.3 g/dL (ref 3.9–4.9)
Alkaline Phosphatase: 126 IU/L — ABNORMAL HIGH (ref 44–121)
BUN/Creatinine Ratio: 18 (ref 10–24)
BUN: 18 mg/dL (ref 8–27)
Bilirubin Total: 0.3 mg/dL (ref 0.0–1.2)
CO2: 21 mmol/L (ref 20–29)
Calcium: 10 mg/dL (ref 8.6–10.2)
Chloride: 100 mmol/L (ref 96–106)
Creatinine, Ser: 1 mg/dL (ref 0.76–1.27)
Globulin, Total: 3 g/dL (ref 1.5–4.5)
Glucose: 120 mg/dL — ABNORMAL HIGH (ref 70–99)
Potassium: 4.1 mmol/L (ref 3.5–5.2)
Sodium: 136 mmol/L (ref 134–144)
Total Protein: 7.3 g/dL (ref 6.0–8.5)
eGFR: 85 mL/min/{1.73_m2} (ref >59)

## 2022-01-02 LAB — LIPID PANEL W/O CHOL/HDL RATIO
Cholesterol, Total: 166 mg/dL (ref 100–199)
HDL: 60 mg/dL (ref >39)
LDL Chol Calc (NIH): 79 mg/dL (ref 0–99)
Triglycerides: 158 mg/dL — ABNORMAL HIGH (ref 0–149)
VLDL Cholesterol Cal: 27 mg/dL (ref 5–40)

## 2022-01-02 LAB — PSA: Prostate Specific Ag, Serum: 0.8 ng/mL (ref 0.0–4.0)

## 2022-01-02 LAB — MAGNESIUM: Magnesium: 1.2 mg/dL — ABNORMAL LOW (ref 1.6–2.3)

## 2022-01-02 LAB — TSH: TSH: 2.68 u[IU]/mL (ref 0.450–4.500)

## 2022-01-02 MED ORDER — MAGNESIUM OXIDE -MG SUPPLEMENT 400 (240 MG) MG PO TABS: 400.0000 mg | ORAL_TABLET | Freq: Every day | ORAL | 12 refills | Status: DC

## 2022-01-02 NOTE — Telephone Encounter (Signed)
Called to check on referral. Spoke to Annice Pih who stated referral coordinator contacted patient phone has been off but patient finally scheduled 03/26/22

## 2022-01-02 NOTE — Progress Notes (Signed)
Good morning, patient does not check MyChart.  Please let him know labs have returned.  Kidney function improved on this check!!  Great news!!  Liver function is normal.  Everything on labs is stable and can continue current medications, but magnesium level is low and this at times can cause some pain.  I am going to send in magnesium supplement for him to start taking every evening.  Any questions? Keep being stellar!!  Thank you for allowing me to participate in your care.  I appreciate you. Kindest regards, Phat Dalton

## 2022-01-29 DIAGNOSIS — R809 Proteinuria, unspecified: Secondary | ICD-10-CM | POA: Diagnosis not present

## 2022-01-29 DIAGNOSIS — I10 Essential (primary) hypertension: Secondary | ICD-10-CM | POA: Diagnosis not present

## 2022-01-29 DIAGNOSIS — N1831 Chronic kidney disease, stage 3a: Secondary | ICD-10-CM | POA: Diagnosis not present

## 2022-01-29 DIAGNOSIS — M1000 Idiopathic gout, unspecified site: Secondary | ICD-10-CM | POA: Diagnosis not present

## 2022-01-29 DIAGNOSIS — E785 Hyperlipidemia, unspecified: Secondary | ICD-10-CM | POA: Diagnosis not present

## 2022-01-29 DIAGNOSIS — R8290 Unspecified abnormal findings in urine: Secondary | ICD-10-CM | POA: Diagnosis not present

## 2022-01-29 DIAGNOSIS — E1122 Type 2 diabetes mellitus with diabetic chronic kidney disease: Secondary | ICD-10-CM | POA: Diagnosis not present

## 2022-01-29 DIAGNOSIS — D631 Anemia in chronic kidney disease: Secondary | ICD-10-CM | POA: Diagnosis not present

## 2022-02-17 DIAGNOSIS — Z79899 Other long term (current) drug therapy: Secondary | ICD-10-CM | POA: Diagnosis not present

## 2022-02-17 DIAGNOSIS — M79642 Pain in left hand: Secondary | ICD-10-CM | POA: Diagnosis not present

## 2022-02-17 DIAGNOSIS — M25551 Pain in right hip: Secondary | ICD-10-CM | POA: Diagnosis not present

## 2022-02-17 DIAGNOSIS — Z131 Encounter for screening for diabetes mellitus: Secondary | ICD-10-CM | POA: Diagnosis not present

## 2022-02-17 DIAGNOSIS — G8929 Other chronic pain: Secondary | ICD-10-CM | POA: Diagnosis not present

## 2022-02-17 DIAGNOSIS — M129 Arthropathy, unspecified: Secondary | ICD-10-CM | POA: Diagnosis not present

## 2022-02-17 DIAGNOSIS — E559 Vitamin D deficiency, unspecified: Secondary | ICD-10-CM | POA: Diagnosis not present

## 2022-02-17 DIAGNOSIS — Z1159 Encounter for screening for other viral diseases: Secondary | ICD-10-CM | POA: Diagnosis not present

## 2022-02-19 ENCOUNTER — Encounter: Payer: Self-pay | Admitting: Hematology and Oncology

## 2022-02-26 ENCOUNTER — Inpatient Hospital Stay: Payer: 59 | Attending: Oncology

## 2022-02-26 DIAGNOSIS — D649 Anemia, unspecified: Secondary | ICD-10-CM | POA: Diagnosis not present

## 2022-02-26 DIAGNOSIS — E1122 Type 2 diabetes mellitus with diabetic chronic kidney disease: Secondary | ICD-10-CM | POA: Insufficient documentation

## 2022-02-26 DIAGNOSIS — N189 Chronic kidney disease, unspecified: Secondary | ICD-10-CM | POA: Insufficient documentation

## 2022-02-26 DIAGNOSIS — Z79899 Other long term (current) drug therapy: Secondary | ICD-10-CM | POA: Insufficient documentation

## 2022-02-26 DIAGNOSIS — I129 Hypertensive chronic kidney disease with stage 1 through stage 4 chronic kidney disease, or unspecified chronic kidney disease: Secondary | ICD-10-CM | POA: Diagnosis not present

## 2022-02-26 DIAGNOSIS — E538 Deficiency of other specified B group vitamins: Secondary | ICD-10-CM | POA: Insufficient documentation

## 2022-02-26 DIAGNOSIS — Z7984 Long term (current) use of oral hypoglycemic drugs: Secondary | ICD-10-CM | POA: Insufficient documentation

## 2022-02-26 LAB — FERRITIN: Ferritin: 173 ng/mL (ref 24–336)

## 2022-02-26 LAB — CBC WITH DIFFERENTIAL/PLATELET
Abs Immature Granulocytes: 0.03 10*3/uL (ref 0.00–0.07)
Basophils Absolute: 0 10*3/uL (ref 0.0–0.1)
Basophils Relative: 1 %
Eosinophils Absolute: 0.1 10*3/uL (ref 0.0–0.5)
Eosinophils Relative: 3 %
HCT: 35 % — ABNORMAL LOW (ref 39.0–52.0)
Hemoglobin: 11 g/dL — ABNORMAL LOW (ref 13.0–17.0)
Immature Granulocytes: 1 %
Lymphocytes Relative: 28 %
Lymphs Abs: 1.2 10*3/uL (ref 0.7–4.0)
MCH: 28.9 pg (ref 26.0–34.0)
MCHC: 31.4 g/dL (ref 30.0–36.0)
MCV: 92.1 fL (ref 80.0–100.0)
Monocytes Absolute: 0.5 10*3/uL (ref 0.1–1.0)
Monocytes Relative: 11 %
Neutro Abs: 2.5 10*3/uL (ref 1.7–7.7)
Neutrophils Relative %: 56 %
Platelets: 201 10*3/uL (ref 150–400)
RBC: 3.8 MIL/uL — ABNORMAL LOW (ref 4.22–5.81)
RDW: 15.1 % (ref 11.5–15.5)
WBC: 4.3 10*3/uL (ref 4.0–10.5)
nRBC: 0 % (ref 0.0–0.2)

## 2022-02-26 LAB — COMPREHENSIVE METABOLIC PANEL
ALT: 18 U/L (ref 0–44)
AST: 23 U/L (ref 15–41)
Albumin: 4 g/dL (ref 3.5–5.0)
Alkaline Phosphatase: 89 U/L (ref 38–126)
Anion gap: 8 (ref 5–15)
BUN: 19 mg/dL (ref 8–23)
CO2: 26 mmol/L (ref 22–32)
Calcium: 9.3 mg/dL (ref 8.9–10.3)
Chloride: 102 mmol/L (ref 98–111)
Creatinine, Ser: 1.15 mg/dL (ref 0.61–1.24)
GFR, Estimated: >60 mL/min (ref >60)
Glucose, Bld: 120 mg/dL — ABNORMAL HIGH (ref 70–99)
Potassium: 4 mmol/L (ref 3.5–5.1)
Sodium: 136 mmol/L (ref 135–145)
Total Bilirubin: 0.5 mg/dL (ref 0.3–1.2)
Total Protein: 8.1 g/dL (ref 6.5–8.1)

## 2022-02-26 LAB — IRON AND TIBC
Iron: 76 ug/dL (ref 45–182)
Saturation Ratios: 23 % (ref 17.9–39.5)
TIBC: 336 ug/dL (ref 250–450)
UIBC: 260 ug/dL

## 2022-02-28 DIAGNOSIS — G8929 Other chronic pain: Secondary | ICD-10-CM | POA: Diagnosis not present

## 2022-02-28 DIAGNOSIS — E1142 Type 2 diabetes mellitus with diabetic polyneuropathy: Secondary | ICD-10-CM | POA: Diagnosis not present

## 2022-02-28 DIAGNOSIS — M79642 Pain in left hand: Secondary | ICD-10-CM | POA: Diagnosis not present

## 2022-02-28 DIAGNOSIS — R030 Elevated blood-pressure reading, without diagnosis of hypertension: Secondary | ICD-10-CM | POA: Diagnosis not present

## 2022-02-28 DIAGNOSIS — I10 Essential (primary) hypertension: Secondary | ICD-10-CM | POA: Diagnosis not present

## 2022-02-28 DIAGNOSIS — M25551 Pain in right hip: Secondary | ICD-10-CM | POA: Diagnosis not present

## 2022-02-28 DIAGNOSIS — Z0000 Encounter for general adult medical examination without abnormal findings: Secondary | ICD-10-CM | POA: Diagnosis not present

## 2022-02-28 DIAGNOSIS — K5909 Other constipation: Secondary | ICD-10-CM | POA: Diagnosis not present

## 2022-02-28 DIAGNOSIS — Z79899 Other long term (current) drug therapy: Secondary | ICD-10-CM | POA: Diagnosis not present

## 2022-02-28 DIAGNOSIS — E119 Type 2 diabetes mellitus without complications: Secondary | ICD-10-CM | POA: Diagnosis not present

## 2022-03-06 DIAGNOSIS — Z79899 Other long term (current) drug therapy: Secondary | ICD-10-CM | POA: Diagnosis not present

## 2022-03-18 ENCOUNTER — Encounter: Payer: Self-pay | Admitting: Hematology and Oncology

## 2022-04-02 ENCOUNTER — Ambulatory Visit: Payer: 59 | Admitting: Nurse Practitioner

## 2022-04-02 DIAGNOSIS — E1140 Type 2 diabetes mellitus with diabetic neuropathy, unspecified: Secondary | ICD-10-CM

## 2022-04-02 DIAGNOSIS — E1129 Type 2 diabetes mellitus with other diabetic kidney complication: Secondary | ICD-10-CM

## 2022-04-02 DIAGNOSIS — D649 Anemia, unspecified: Secondary | ICD-10-CM

## 2022-04-02 DIAGNOSIS — D696 Thrombocytopenia, unspecified: Secondary | ICD-10-CM

## 2022-04-02 DIAGNOSIS — R7989 Other specified abnormal findings of blood chemistry: Secondary | ICD-10-CM

## 2022-04-02 DIAGNOSIS — E669 Obesity, unspecified: Secondary | ICD-10-CM

## 2022-04-02 DIAGNOSIS — E538 Deficiency of other specified B group vitamins: Secondary | ICD-10-CM

## 2022-04-02 DIAGNOSIS — E1159 Type 2 diabetes mellitus with other circulatory complications: Secondary | ICD-10-CM

## 2022-04-02 DIAGNOSIS — E1169 Type 2 diabetes mellitus with other specified complication: Secondary | ICD-10-CM

## 2022-04-02 DIAGNOSIS — F1010 Alcohol abuse, uncomplicated: Secondary | ICD-10-CM

## 2022-04-02 DIAGNOSIS — E1122 Type 2 diabetes mellitus with diabetic chronic kidney disease: Secondary | ICD-10-CM

## 2022-04-10 ENCOUNTER — Other Ambulatory Visit: Payer: Self-pay | Admitting: Family Medicine

## 2022-04-10 NOTE — Telephone Encounter (Signed)
Requested medication (s) are due for refill today: yes  Requested medication (s) are on the active medication list: yes  Last refill:  07/29/21 #90 0 refills  Future visit scheduled: no   Notes to clinic:  no refills remain. Do you want to refill Rx?     Requested Prescriptions  Pending Prescriptions Disp Refills   losartan (COZAAR) 100 MG tablet [Pharmacy Med Name: LOSARTAN '100MG'$  TABLETS] 90 tablet 0    Sig: TAKE ONE TABLET BY MOUTH ONCE DAILY     Cardiovascular:  Angiotensin Receptor Blockers Passed - 04/10/2022 10:06 AM      Passed - Cr in normal range and within 180 days    Creatinine  Date Value Ref Range Status  09/19/2013 1.25 0.60 - 1.30 mg/dL Final   Creatinine, Ser  Date Value Ref Range Status  02/26/2022 1.15 0.61 - 1.24 mg/dL Final         Passed - K in normal range and within 180 days    Potassium  Date Value Ref Range Status  02/26/2022 4.0 3.5 - 5.1 mmol/L Final  09/19/2013 3.4 (L) 3.5 - 5.1 mmol/L Final         Passed - Patient is not pregnant      Passed - Last BP in normal range    BP Readings from Last 1 Encounters:  01/01/22 115/74         Passed - Valid encounter within last 6 months    Recent Outpatient Visits           3 months ago Type 2 diabetes mellitus with proteinuria ([place])   [place], Henrine Screws T, NP   9 months ago Neuropathy   [place] V[place], Avanti, MD   1 year ago Neuropathy   [place] Vigg, Avanti, MD   1 year ago Primary hypertension   [place], Scheryl Darter, NP   1 year ago Prediabetes   [place] McEl[place]e, Scheryl Darter, NP

## 2022-04-11 ENCOUNTER — Other Ambulatory Visit: Payer: Self-pay | Admitting: Nurse Practitioner

## 2022-04-11 NOTE — Telephone Encounter (Signed)
Unable to refill per protocol, Rx request is too soon. Last refill 04/10/22 for 60 days.  Requested Prescriptions  Pending Prescriptions Disp Refills   losartan (COZAAR) 100 MG tablet [Pharmacy Med Name: LOSARTAN 100MG  TABLETS] 90 tablet     Sig: TAKE 1 TABLET BY MOUTH DAILY     Cardiovascular:  Angiotensin Receptor Blockers Passed - 04/11/2022  9:02 AM      Passed - Cr in normal range and within 180 days    Creatinine  Date Value Ref Range Status  09/19/2013 1.25 0.60 - 1.30 mg/dL Final   Creatinine, Ser  Date Value Ref Range Status  02/26/2022 1.15 0.61 - 1.24 mg/dL Final         Passed - K in normal range and within 180 days    Potassium  Date Value Ref Range Status  02/26/2022 4.0 3.5 - 5.1 mmol/L Final  09/19/2013 3.4 (L) 3.5 - 5.1 mmol/L Final         Passed - Patient is not pregnant      Passed - Last BP in normal range    BP Readings from Last 1 Encounters:  01/01/22 115/74         Passed - Valid encounter within last 6 months    Recent Outpatient Visits           3 months ago Type 2 diabetes mellitus with proteinuria ([place])   [place], Henrine Screws T, NP   9 months ago Neuropathy   [place] Vigg, Avanti, MD   1 year ago Neuropathy   [place] Vigg, Avanti, MD   1 year ago Primary hypertension   [place], Scheryl Darter, NP   1 year ago Prediabetes   [place] McElwee, Scheryl Darter, NP

## 2022-04-19 NOTE — Patient Instructions (Incomplete)
Lyla Son, MD (Attending) [phone] (Work)  Hip Pain The hip is the joint between the upper legs and the lower pelvis. The bones, cartilage, tendons, and muscles of your hip joint support your body and allow you to move around. Hip pain can range from a minor ache to severe pain in one or both of your hips. The pain may be felt on the inside of the hip joint near the groin, or on the outside near the buttocks and upper thigh. You may also have swelling or stiffness in your hip area. Follow these instructions at home: Managing pain, stiffness, and swelling     If told, put ice on the painful area. Put ice in a plastic bag. Place a towel between your skin and the bag. Leave the ice on for 20 minutes, 2-3 times a day. If told, apply heat to the affected area as often as told by your health care provider. Use the heat source that your provider recommends, such as a moist heat pack or a heating pad. Place a towel between your skin and the heat source. Leave the heat on for 20-30 minutes. If your skin turns bright red, remove the ice or heat right away to prevent skin damage. The risk of damage is higher if you cannot feel pain, heat, or cold. Activity Do exercises as told by your provider. Avoid activities that cause pain. General instructions  Take over-the-counter and prescription medicines only as told by your provider. Keep a journal of your symptoms. Write down: How often you have hip pain. The location of your pain. What the pain feels like. What makes the pain worse. Sleep with a pillow between your legs on your most comfortable side. Keep all follow-up visits. Your provider will monitor your pain and activity. Contact a health care provider if: You cannot put weight on your leg. Your pain or swelling gets worse after a week. It gets harder to walk. You have a fever. Get help right away if: You fall. You have a sudden increase in pain and swelling in your hip. Your  hip is red or swollen or very tender to touch. This information is not intended to replace advice given to you by your health care provider. Make sure you discuss any questions you have with your health care provider. Document Revised: 09/17/2021 Document Reviewed: 09/17/2021 Elsevier Patient Education  [place].

## 2022-04-23 ENCOUNTER — Encounter: Payer: Self-pay | Admitting: Nurse Practitioner

## 2022-04-23 ENCOUNTER — Ambulatory Visit (INDEPENDENT_AMBULATORY_CARE_PROVIDER_SITE_OTHER): Payer: 59 | Admitting: Nurse Practitioner

## 2022-04-23 VITALS — BP 120/78 | HR 108 | Temp 98.1°F | Ht 70.98 in | Wt 219.7 lb

## 2022-04-23 DIAGNOSIS — E669 Obesity, unspecified: Secondary | ICD-10-CM

## 2022-04-23 DIAGNOSIS — E1159 Type 2 diabetes mellitus with other circulatory complications: Secondary | ICD-10-CM

## 2022-04-23 DIAGNOSIS — E785 Hyperlipidemia, unspecified: Secondary | ICD-10-CM

## 2022-04-23 DIAGNOSIS — K219 Gastro-esophageal reflux disease without esophagitis: Secondary | ICD-10-CM

## 2022-04-23 DIAGNOSIS — R809 Proteinuria, unspecified: Secondary | ICD-10-CM

## 2022-04-23 DIAGNOSIS — R7989 Other specified abnormal findings of blood chemistry: Secondary | ICD-10-CM

## 2022-04-23 DIAGNOSIS — E1129 Type 2 diabetes mellitus with other diabetic kidney complication: Secondary | ICD-10-CM

## 2022-04-23 DIAGNOSIS — E1122 Type 2 diabetes mellitus with diabetic chronic kidney disease: Secondary | ICD-10-CM | POA: Diagnosis not present

## 2022-04-23 DIAGNOSIS — E1140 Type 2 diabetes mellitus with diabetic neuropathy, unspecified: Secondary | ICD-10-CM | POA: Diagnosis not present

## 2022-04-23 DIAGNOSIS — E1169 Type 2 diabetes mellitus with other specified complication: Secondary | ICD-10-CM

## 2022-04-23 DIAGNOSIS — F1010 Alcohol abuse, uncomplicated: Secondary | ICD-10-CM

## 2022-04-23 DIAGNOSIS — D649 Anemia, unspecified: Secondary | ICD-10-CM | POA: Diagnosis not present

## 2022-04-23 DIAGNOSIS — M25551 Pain in right hip: Secondary | ICD-10-CM

## 2022-04-23 DIAGNOSIS — I152 Hypertension secondary to endocrine disorders: Secondary | ICD-10-CM

## 2022-04-23 DIAGNOSIS — N183 Chronic kidney disease, stage 3 unspecified: Secondary | ICD-10-CM

## 2022-04-23 DIAGNOSIS — D696 Thrombocytopenia, unspecified: Secondary | ICD-10-CM | POA: Diagnosis not present

## 2022-04-23 LAB — BAYER DCA HB A1C WAIVED: HB A1C (BAYER DCA - WAIVED): 6.3 % — ABNORMAL HIGH (ref 4.8–5.6)

## 2022-04-23 LAB — MICROALBUMIN, URINE WAIVED
Creatinine, Urine Waived: 100 mg/dL (ref 10–300)
Microalb, Ur Waived: 80 mg/L — ABNORMAL HIGH (ref 0–19)

## 2022-04-23 MED ORDER — TIZANIDINE HCL 2 MG PO TABS: 2.0000 mg | ORAL_TABLET | Freq: Three times a day (TID) | ORAL | 0 refills | Status: DC | PRN

## 2022-04-23 MED ORDER — PANTOPRAZOLE SODIUM 40 MG PO TBEC: 40.0000 mg | DELAYED_RELEASE_TABLET | Freq: Every day | ORAL | 4 refills | Status: DC

## 2022-04-23 MED ORDER — GABAPENTIN 300 MG PO CAPS: ORAL_CAPSULE | ORAL | 4 refills | Status: DC

## 2022-04-23 NOTE — Assessment & Plan Note (Signed)
Chronic, stable.  BP at goal in office.  Recommend he monitor BP at least a few mornings a week at home and document.  DASH diet at home.  Continue current medication regimen and adjust as needed.  Labs today: CMP and urine ALB.  Urine ALB 80 March 2024, continue Losartan for kidney protection.  Return in 3 months.

## 2022-04-23 NOTE — Assessment & Plan Note (Signed)
Recheck on labs today and consider imaging in future if elevations trend up.  Recommend cut back on alcohol use.

## 2022-04-23 NOTE — Assessment & Plan Note (Signed)
Chronic, ongoing.  Followed by hematology, recent notes reviewed and labs.

## 2022-04-23 NOTE — Assessment & Plan Note (Addendum)
Chronic, ongoing with A1c 6.3% today.  At this time continue Metformin as ordered and monitor closely, renal dose as needed.  Urine ALB 80 (March 2024), continue Losartan for kidney protection -- CKD noted on recent labs and is to schedule with nephrology per hematology referral -- provided him # today to call.  Continue Gabapentin for neuropathy pain, but adjust dosing to 300 MG morning and noon + 600 MG night time.  Recommend he check BS 3 days a week and document for provider visits.  Return in 3 months.

## 2022-04-23 NOTE — Assessment & Plan Note (Signed)
Chronic, ongoing.  Followed by hematology and recent notes reviewed.

## 2022-04-23 NOTE — Assessment & Plan Note (Signed)
Chronic, ongoing with A1c 6.3% today.  At this time continue Metformin as ordered and monitor closely, renal dose as needed.  Urine ALB 80 (March 2024), continue Losartan for kidney protection -- CKD noted on recent labs and is to schedule with nephrology per hematology referral -- provided him # today to call.  Recommend he check BS 3 days a week and document for provider visits.  Return in 3 months.

## 2022-04-23 NOTE — Assessment & Plan Note (Signed)
Chronic, ongoing with A1c 6.3% today.  At this time continue Metformin as ordered and monitor closely, renal dose as needed.  Urine ALB 80 (March 2024), continue Losartan for kidney protection -- CKD noted on recent labs and is to schedule with nephrology per hematology referral -- provided him # today to call.  Continue Gabapentin for neuropathy pain.  Recommend he check BS 3 days a week and document for provider visits.  Return in 3 months.

## 2022-04-23 NOTE — Assessment & Plan Note (Signed)
Recheck on labs today, low on recent check. To be taking supplement.

## 2022-04-23 NOTE — Assessment & Plan Note (Signed)
Chronic, ongoing.  Continue current medication regimen and adjust as needed. Lipid panel today. 

## 2022-04-23 NOTE — Assessment & Plan Note (Signed)
Chronic, ongoing.  Referral to return to ortho for further recommendations placed.

## 2022-04-23 NOTE — Assessment & Plan Note (Signed)
BMI 30.66.  Recommended eating smaller high protein, low fat meals more frequently and exercising 30 mins a day 5 times a week with a goal of 10-15lb weight loss in the next 3 months. Patient voiced their understanding and motivation to adhere to these recommendations.

## 2022-04-23 NOTE — Progress Notes (Signed)
BP 120/78   Pulse (!) 108   Temp 98.1 F (36.7 C) (Oral)   Ht 5' 10.98" (1.803 m)   Wt 219 lb 11.2 oz (99.7 kg)   SpO2 98%   BMI 30.66 kg/m    Subjective:    Patient ID: George Gomez, male    DOB: 1960-02-08, 62 y.o.   MRN: [id number]  HPI: George Gomez is a 62 y.o. male  Chief Complaint  Patient presents with   Hip Pain    Started getting worse about a week ago   DIABETES Continues on Metformin XL 500 MG daily.  Last A1c was 6% December.   Hypoglycemic episodes:no Polydipsia/polyuria: no Visual disturbance: no Chest pain: no Paresthesias: no Glucose Monitoring: no  Accucheck frequency: Not Checking  Fasting glucose:  Post prandial:  Evening:  Before meals: Taking Insulin?: no  Long acting insulin:  Short acting insulin: Blood Pressure Monitoring: not checking Retinal Examination: Not up to Date -- [facility] Foot Exam: Up to Date Pneumovax: Refuses Influenza: Up to Date Aspirin: no   CHRONIC KIDNEY DISEASE Improving recent labs.  Last saw nephrology 01/29/22 CKD status: stable Medications renally dose: yes Previous renal evaluation: no Pneumovax:  Not up to Date Influenza Vaccine:  Up to Date   HYPERTENSION / HYPERLIPIDEMIA Continues on Amlodipine, Losartan, Propranolol, + Atorvastatin. Drinks a shot or two of liquor daily -- vodka. Have noticed elevation in LFTs on labs in past. Satisfied with current treatment? yes Duration of hypertension: chronic BP monitoring frequency: weekly BP range: 120/70 range BP medication side effects: no Duration of hyperlipidemia: chronic Cholesterol medication side effects: no Cholesterol supplements: none Medication compliance: good compliance Aspirin: no Recent stressors: no Recurrent headaches: no Visual changes: no Palpitations: no Dyspnea: no Chest pain: no Lower extremity edema: occasional Dizzy/lightheaded: no  ANEMIA Followed by hematology for low B12 and anemia.  Last visit 11/26/21.  Obtains  B12 shots from them.  Missed recent visit with them.  Continues Protonix for stomach and GERD + has low Mag levels recently.  To be taking supplement. Anemia status: stable Etiology of anemia: B12 and iron deficiency Duration of anemia treatment: via hematology Compliance with treatment: good compliance Iron supplementation side effects: no Severity of anemia: moderate Fatigue: no Decreased exercise tolerance: no  Dyspnea on exertion: no Palpitations: no Bleeding: no Pica: no   HIP PAIN Has ongoing right hip pain, feels this worsening.  Has seen ortho in past, last visit 04/26/21, did physical therapy in past. Imaging done at [facility] - noted lumbar arthritis and right hip arthritis.  Currently taking Gabapentin, ordered 600 MG at bedtime and reports he does take a dose in morning.    Went to [facility] in [place], he reports they gave a couple shots, but this did not help. Duration: chronic Involved hip: right  Mechanism of injury: unknown Location: diffuse Onset: gradual  Severity: 9/10  Quality: dull, aching, and throbbing Frequency: intermittent Radiation: no Aggravating factors: weight bearing and movement   Alleviating factors: Gabapentin  Status: stable Treatments attempted: Gabapentin, Ibuprofen   Relief with NSAIDs?: No NSAIDs Taken Weakness with weight bearing: yes Weakness with walking: yes Paresthesias / decreased sensation: yes Swelling: no Redness:no Fevers: no   Relevant past medical, surgical, family and social history reviewed and updated as indicated. Interim medical history since our last visit reviewed. Allergies and medications reviewed and updated.  Review of Systems  Constitutional:  Negative for activity change, diaphoresis, fatigue and fever.  Respiratory:  Negative for cough, chest tightness, shortness of breath and wheezing.   Cardiovascular:  Negative for chest pain, palpitations and leg swelling.  Endocrine: Negative for  cold intolerance, heat intolerance, polydipsia, polyphagia and polyuria.  Musculoskeletal:  Positive for arthralgias.  Neurological:  Negative for dizziness, syncope, weakness, light-headedness, numbness and headaches.  Psychiatric/Behavioral: Negative.      Per HPI unless specifically indicated above     Objective:    BP 120/78   Pulse (!) 108   Temp 98.1 F (36.7 C) (Oral)   Ht 5' 10.98" (1.803 m)   Wt 219 lb 11.2 oz (99.7 kg)   SpO2 98%   BMI 30.66 kg/m   Wt Readings from Last 3 Encounters:  04/23/22 219 lb 11.2 oz (99.7 kg)  01/01/22 218 lb 1.6 oz (98.9 kg)  08/21/21 207 lb (93.9 kg)    Physical Exam Vitals and nursing note reviewed.  Constitutional:      General: He is awake. He is not in acute distress.    Appearance: He is well-developed and well-groomed. He is obese. He is not ill-appearing or toxic-appearing.  HENT:     Head: Normocephalic and atraumatic.     Right Ear: Hearing and external ear normal. No drainage.     Left Ear: Hearing and external ear normal. No drainage.  Eyes:     General: Lids are normal.        Right eye: No discharge.        Left eye: No discharge.     Conjunctiva/sclera: Conjunctivae normal.     Pupils: Pupils are equal, round, and reactive to light.  Neck:     Thyroid: No thyromegaly.     Vascular: No carotid bruit.  Cardiovascular:     Rate and Rhythm: Normal rate and regular rhythm.     Heart sounds: Normal heart sounds, S1 normal and S2 normal. No murmur heard.    No gallop.  Pulmonary:     Effort: Pulmonary effort is normal. No accessory muscle usage or respiratory distress.     Breath sounds: Normal breath sounds.  Abdominal:     General: Bowel sounds are normal.     Palpations: Abdomen is soft.  Musculoskeletal:     Cervical back: Normal range of motion and neck supple.     Lumbar back: No swelling or tenderness. Normal range of motion. Negative right straight leg raise test and negative left straight leg raise test.      Right hip: Tenderness and crepitus present. No bony tenderness. Decreased range of motion. Decreased strength.     Left hip: Normal.     Right lower leg: No edema.     Left lower leg: No edema.  Lymphadenopathy:     Cervical: No cervical adenopathy.  Skin:    General: Skin is warm and dry.     Capillary Refill: Capillary refill takes less than 2 seconds.     Findings: No rash.  Neurological:     Mental Status: He is alert and oriented to person, place, and time.     Cranial Nerves: Cranial nerves 2-12 are intact.     Sensory: Sensation is intact.     Coordination: Coordination is intact.     Gait: Gait abnormal (antalgic gait present).     Deep Tendon Reflexes: Reflexes are normal and symmetric.     Reflex Scores:      Brachioradialis reflexes are 2+ on the right side and 2+ on the left side.  Patellar reflexes are 2+ on the right side and 2+ on the left side. Psychiatric:        Attention and Perception: Attention normal.        Mood and Affect: Mood normal.        Speech: Speech normal.        Behavior: Behavior normal. Behavior is cooperative.        Thought Content: Thought content normal.     Results for orders placed or performed in visit on 04/23/22  Bayer DCA Hb A1c Waived  Result Value Ref Range   HB A1C (BAYER DCA - WAIVED) 6.3 (H) 4.8 - 5.6 %  Microalbumin, Urine Waived  Result Value Ref Range   Microalb, Ur Waived 80 (H) 0 - 19 mg/L   Creatinine, Urine Waived 100 10 - 300 mg/dL   Microalb/Creat Ratio 30-300 (H) <30 mg/g      Assessment & Plan:   Problem List Items Addressed This Visit       Cardiovascular and Mediastinum   Hypertension associated with type 2 diabetes mellitus (HCC)    Chronic, stable.  BP at goal in office.  Recommend he monitor BP at least a few mornings a week at home and document.  DASH diet at home.  Continue current medication regimen and adjust as needed.  Labs today: CMP and urine ALB.  Urine ALB 80 March 2024, continue Losartan  for kidney protection.  Return in 3 months.        Relevant Orders   Bayer DCA Hb A1c Waived (Completed)   Microalbumin, Urine Waived (Completed)   Comprehensive metabolic panel     Digestive   Gastroesophageal reflux disease    Chronic, ongoing with occasional low mag level.  Recheck today and adjust regimen as needed.  Risks of PPI use were discussed with patient including bone loss, C. Diff diarrhea, pneumonia, infections, CKD, electrolyte abnormalities.  Verbalizes understanding and chooses to continue the medication.       Relevant Medications   pantoprazole (PROTONIX) 40 MG tablet     Endocrine   CKD stage 3 due to type 2 diabetes mellitus (HCC)    Chronic, ongoing with A1c 6.3% today.  At this time continue Metformin as ordered and monitor closely, renal dose as needed.  Urine ALB 80 (March 2024), continue Losartan for kidney protection -- CKD noted on recent labs and is to schedule with nephrology per hematology referral -- provided him # today to call.  Recommend he check BS 3 days a week and document for provider visits.  Return in 3 months.      Relevant Orders   Bayer DCA Hb A1c Waived (Completed)   Microalbumin, Urine Waived (Completed)   Comprehensive metabolic panel   Hyperlipidemia due to type 2 diabetes mellitus (HCC)    Chronic, ongoing.  Continue current medication regimen and adjust as needed.  Lipid panel today.        Relevant Orders   Bayer DCA Hb A1c Waived (Completed)   Comprehensive metabolic panel   Lipid Panel w/o Chol/HDL Ratio   Type 2 diabetes mellitus with diabetic neuropathy (HCC)    Chronic, ongoing with A1c 6.3% today.  At this time continue Metformin as ordered and monitor closely, renal dose as needed.  Urine ALB 80 (March 2024), continue Losartan for kidney protection -- CKD noted on recent labs and is to schedule with nephrology per hematology referral -- provided him # today to call.  Continue Gabapentin for neuropathy pain,  but adjust  dosing to 300 MG morning and noon + 600 MG night time.  Recommend he check BS 3 days a week and document for provider visits.  Return in 3 months.      Relevant Medications   gabapentin (NEURONTIN) 300 MG capsule   Other Relevant Orders   Bayer DCA Hb A1c Waived (Completed)   Microalbumin, Urine Waived (Completed)   Comprehensive metabolic panel   Type 2 diabetes mellitus with proteinuria (HCC) - Primary    Chronic, ongoing with A1c 6.3% today.  At this time continue Metformin as ordered and monitor closely, renal dose as needed.  Urine ALB 80 (March 2024), continue Losartan for kidney protection -- CKD noted on recent labs and is to schedule with nephrology per hematology referral -- provided him # today to call.  Continue Gabapentin for neuropathy pain.  Recommend he check BS 3 days a week and document for provider visits.  Return in 3 months.      Relevant Orders   Bayer DCA Hb A1c Waived (Completed)   Microalbumin, Urine Waived (Completed)   Comprehensive metabolic panel     Hematopoietic and Hemostatic   Thrombocytopenia (HCC)    Chronic, ongoing.  Followed by hematology and recent notes reviewed.        Other   Alcohol abuse    Chronic, he reports a few shots vodka daily.  Check CMP today and consider imaging if elevations.  Recommend working towards complete cessation of alcohol use.      Relevant Orders   Comprehensive metabolic panel   Elevated LFTs    Recheck on labs today and consider imaging in future if elevations trend up.  Recommend cut back on alcohol use.      Relevant Orders   Comprehensive metabolic panel   Hypomagnesemia    Recheck on labs today, low on recent check. To be taking supplement.      Relevant Orders   Magnesium   Normocytic anemia    Chronic, ongoing.  Followed by hematology, recent notes reviewed and labs.      Obesity (BMI 30-39.9)    BMI 30.66.  Recommended eating smaller high protein, low fat meals more frequently and exercising 30  mins a day 5 times a week with a goal of 10-15lb weight loss in the next 3 months. Patient voiced their understanding and motivation to adhere to these recommendations.       Right hip pain    Chronic, ongoing.  Referral to return to ortho for further recommendations placed.      Relevant Orders   Ambulatory referral to Orthopedic Surgery     Follow up plan: Return in about 3 months (around 07/24/2022) for T2DM, HTN/HLD, GOUT, ANEMIA/CKD.

## 2022-04-23 NOTE — Assessment & Plan Note (Signed)
Chronic, ongoing with occasional low mag level.  Recheck today and adjust regimen as needed.  Risks of PPI use were discussed with patient including bone loss, C. Diff diarrhea, pneumonia, infections, CKD, electrolyte abnormalities.  Verbalizes understanding and chooses to continue the medication.

## 2022-04-23 NOTE — Assessment & Plan Note (Signed)
Chronic, he reports a few shots vodka daily.  Check CMP today and consider imaging if elevations.  Recommend working towards complete cessation of alcohol use.

## 2022-04-24 LAB — COMPREHENSIVE METABOLIC PANEL
ALT: 27 IU/L (ref 0–44)
AST: 38 IU/L (ref 0–40)
Albumin/Globulin Ratio: 1.4 (ref 1.2–2.2)
Albumin: 4.4 g/dL (ref 3.9–4.9)
Alkaline Phosphatase: 128 IU/L — ABNORMAL HIGH (ref 44–121)
BUN/Creatinine Ratio: 15 (ref 10–24)
BUN: 16 mg/dL (ref 8–27)
Bilirubin Total: 0.3 mg/dL (ref 0.0–1.2)
CO2: 24 mmol/L (ref 20–29)
Calcium: 9.9 mg/dL (ref 8.6–10.2)
Chloride: 101 mmol/L (ref 96–106)
Creatinine, Ser: 1.05 mg/dL (ref 0.76–1.27)
Globulin, Total: 3.2 g/dL (ref 1.5–4.5)
Glucose: 93 mg/dL (ref 70–99)
Potassium: 4.1 mmol/L (ref 3.5–5.2)
Sodium: 140 mmol/L (ref 134–144)
Total Protein: 7.6 g/dL (ref 6.0–8.5)
eGFR: 79 mL/min/{1.73_m2} (ref >59)

## 2022-04-24 LAB — LIPID PANEL W/O CHOL/HDL RATIO
Cholesterol, Total: 166 mg/dL (ref 100–199)
HDL: 66 mg/dL (ref >39)
LDL Chol Calc (NIH): 61 mg/dL (ref 0–99)
Triglycerides: 244 mg/dL — ABNORMAL HIGH (ref 0–149)
VLDL Cholesterol Cal: 39 mg/dL (ref 5–40)

## 2022-04-24 LAB — MAGNESIUM: Magnesium: 1.3 mg/dL — ABNORMAL LOW (ref 1.6–2.3)

## 2022-04-25 ENCOUNTER — Other Ambulatory Visit: Payer: Self-pay | Admitting: Nurse Practitioner

## 2022-04-25 MED ORDER — MAGNESIUM OXIDE -MG SUPPLEMENT 400 (240 MG) MG PO TABS: 400.0000 mg | ORAL_TABLET | Freq: Two times a day (BID) | ORAL | 1 refills | Status: DC

## 2022-04-25 NOTE — Progress Notes (Signed)
Contacted via MyChart == do not think he checks this, so please call to ensure, thanks:) Good day George Gomez, your labs have returned: - Kidney function remains stable, as is liver function. - Cholesterol levels show stable LDL, triglycerides remain a little elevated -- please ensure to focus on healthy diet and continue all medications at home. - Magnesium level remains on low side.  I am changing your Magnesium to twice a day dosing and recommend you cut back on your Protonix to half of a tablet daily, as this medication lowers magnesium levels.  Any questions? Keep being amazing!!  Thank you for allowing me to participate in your care.  I appreciate you. Kindest regards, Ercia Crisafulli

## 2022-05-13 ENCOUNTER — Other Ambulatory Visit: Payer: Self-pay | Admitting: Physician Assistant

## 2022-05-13 DIAGNOSIS — M25551 Pain in right hip: Secondary | ICD-10-CM | POA: Diagnosis not present

## 2022-05-13 DIAGNOSIS — M5416 Radiculopathy, lumbar region: Secondary | ICD-10-CM | POA: Diagnosis not present

## 2022-05-15 ENCOUNTER — Ambulatory Visit
Admission: RE | Admit: 2022-05-15 | Discharge: 2022-05-15 | Disposition: A | Discharge status: Home / Self Care | Payer: 59 | Source: Ambulatory Visit | Attending: Physician Assistant | Admitting: Physician Assistant

## 2022-05-15 DIAGNOSIS — M48061 Spinal stenosis, lumbar region without neurogenic claudication: Secondary | ICD-10-CM | POA: Diagnosis not present

## 2022-05-15 DIAGNOSIS — F17210 Nicotine dependence, cigarettes, uncomplicated: Secondary | ICD-10-CM | POA: Diagnosis not present

## 2022-05-15 DIAGNOSIS — M5416 Radiculopathy, lumbar region: Secondary | ICD-10-CM | POA: Diagnosis not present

## 2022-05-15 DIAGNOSIS — H532 Diplopia: Secondary | ICD-10-CM | POA: Diagnosis not present

## 2022-05-15 DIAGNOSIS — Z79899 Other long term (current) drug therapy: Secondary | ICD-10-CM | POA: Diagnosis not present

## 2022-05-15 DIAGNOSIS — M5126 Other intervertebral disc displacement, lumbar region: Secondary | ICD-10-CM | POA: Diagnosis not present

## 2022-05-15 DIAGNOSIS — I10 Essential (primary) hypertension: Secondary | ICD-10-CM | POA: Diagnosis not present

## 2022-05-15 DIAGNOSIS — R29818 Other symptoms and signs involving the nervous system: Secondary | ICD-10-CM | POA: Diagnosis not present

## 2022-05-27 ENCOUNTER — Inpatient Hospital Stay: Payer: 59 | Admitting: Oncology

## 2022-05-27 ENCOUNTER — Encounter: Payer: Self-pay | Admitting: Oncology

## 2022-05-27 ENCOUNTER — Inpatient Hospital Stay: Payer: 59 | Attending: Oncology

## 2022-05-29 ENCOUNTER — Other Ambulatory Visit: Payer: Self-pay

## 2022-05-29 ENCOUNTER — Emergency Department: Payer: 59

## 2022-05-29 ENCOUNTER — Emergency Department
Admission: EM | Admit: 2022-05-29 | Discharge: 2022-05-29 | Disposition: A | Discharge status: Home / Self Care | Payer: 59 | Attending: Emergency Medicine | Admitting: Emergency Medicine

## 2022-05-29 DIAGNOSIS — Z20822 Contact with and (suspected) exposure to covid-19: Secondary | ICD-10-CM | POA: Diagnosis not present

## 2022-05-29 DIAGNOSIS — I959 Hypotension, unspecified: Secondary | ICD-10-CM | POA: Diagnosis not present

## 2022-05-29 DIAGNOSIS — E860 Dehydration: Secondary | ICD-10-CM | POA: Insufficient documentation

## 2022-05-29 DIAGNOSIS — E878 Other disorders of electrolyte and fluid balance, not elsewhere classified: Secondary | ICD-10-CM | POA: Diagnosis not present

## 2022-05-29 DIAGNOSIS — J9811 Atelectasis: Secondary | ICD-10-CM | POA: Diagnosis not present

## 2022-05-29 LAB — BASIC METABOLIC PANEL
Anion gap: 14 (ref 5–15)
BUN: 17 mg/dL (ref 8–23)
CO2: 18 mmol/L — ABNORMAL LOW (ref 22–32)
Calcium: 8.3 mg/dL — ABNORMAL LOW (ref 8.9–10.3)
Chloride: 98 mmol/L (ref 98–111)
Creatinine, Ser: 1.04 mg/dL (ref 0.61–1.24)
GFR, Estimated: >60 mL/min (ref >60)
Glucose, Bld: 157 mg/dL — ABNORMAL HIGH (ref 70–99)
Potassium: 3.4 mmol/L — ABNORMAL LOW (ref 3.5–5.1)
Sodium: 130 mmol/L — ABNORMAL LOW (ref 135–145)

## 2022-05-29 LAB — HEPATIC FUNCTION PANEL
ALT: 27 U/L (ref 0–44)
AST: 53 U/L — ABNORMAL HIGH (ref 15–41)
Albumin: 3.8 g/dL (ref 3.5–5.0)
Alkaline Phosphatase: 79 U/L (ref 38–126)
Bilirubin, Direct: 0.3 mg/dL — ABNORMAL HIGH (ref 0.0–0.2)
Indirect Bilirubin: 1.5 mg/dL — ABNORMAL HIGH (ref 0.3–0.9)
Total Bilirubin: 1.8 mg/dL — ABNORMAL HIGH (ref 0.3–1.2)
Total Protein: 7.5 g/dL (ref 6.5–8.1)

## 2022-05-29 LAB — CBC
HCT: 34.2 % — ABNORMAL LOW (ref 39.0–52.0)
Hemoglobin: 11.1 g/dL — ABNORMAL LOW (ref 13.0–17.0)
MCH: 29.4 pg (ref 26.0–34.0)
MCHC: 32.5 g/dL (ref 30.0–36.0)
MCV: 90.5 fL (ref 80.0–100.0)
Platelets: 109 10*3/uL — ABNORMAL LOW (ref 150–400)
RBC: 3.78 MIL/uL — ABNORMAL LOW (ref 4.22–5.81)
RDW: 15.7 % — ABNORMAL HIGH (ref 11.5–15.5)
WBC: 3.7 10*3/uL — ABNORMAL LOW (ref 4.0–10.5)
nRBC: 0 % (ref 0.0–0.2)

## 2022-05-29 LAB — CBG MONITORING, ED: Glucose-Capillary: 156 mg/dL — ABNORMAL HIGH (ref 70–99)

## 2022-05-29 LAB — SARS CORONAVIRUS 2 BY RT PCR: SARS Coronavirus 2 by RT PCR: NEGATIVE

## 2022-05-29 LAB — TROPONIN I (HIGH SENSITIVITY)
Troponin I (High Sensitivity): 10 ng/L (ref <18)
Troponin I (High Sensitivity): 8 ng/L (ref <18)

## 2022-05-29 MED ORDER — SODIUM CHLORIDE 0.9 % IV BOLUS
1000.0000 mL | Freq: Once | INTRAVENOUS | Status: AC
  Administered 2022-05-29: 1000 mL via INTRAVENOUS

## 2022-05-29 NOTE — ED Provider Notes (Signed)
[facility] Provider Note    Event Date/Time   First MD Initiated Contact with Patient 05/29/22 0932     (approximate)   History   Hypotension   HPI  George Gomez is a 62 y.o. male who comes in from a doctor's office with concerns for low blood pressure.  Patient reports that he took all of his normal medications today.  He states he is not sure of the medications but he does take medicine for blood pressure.  He denies taking too many of them.  On review of records it appears that patient is on amlodipine 10 mg, losartan 100 mg, propranolol ER 60.   Patient reports that he did not eat breakfast this morning but he states that he intermittently does skip breakfast.  He states that he was at his doctor appointment when he felt lightheaded.  He states it was worse with movements and felt like he was going to pass out.  He denies any current dizziness.  He denies any chest pain, shortness of breath.  He denies any abdominal pain, urinary symptoms or any other concerns.  He states that prior to this he felt fine.  Denies any falls or hitting his head or any other concerns    Physical Exam   Triage Vital Signs: ED Triage Vitals  Enc Vitals Group     BP      Pulse      Resp      Temp      Temp src      SpO2      Weight      Height      Head Circumference      Peak Flow      Pain Score      Pain Loc      Pain Edu?      Excl. in GC?     Most recent vital signs: Vitals:   05/29/22 1030 05/29/22 1100  BP: 102/69   Pulse:    Resp:    Temp:  97.7 F (36.5 C)  SpO2:       General: Awake, no distress.  CV:  Good peripheral perfusion.  Resp:  Normal effort.  Abd:  No distention.  Soft nontender. Other:  No swelling in legs.  No calf tenderness.   ED Results / Procedures / Treatments   Labs (all labs ordered are listed, but only abnormal results are displayed) Labs Reviewed  BASIC METABOLIC PANEL - Abnormal; Notable for the following  components:      Result Value   Sodium 130 (*)    Potassium 3.4 (*)    CO2 18 (*)    Glucose, Bld 157 (*)    Calcium 8.3 (*)    All other components within normal limits  CBC - Abnormal; Notable for the following components:   WBC 3.7 (*)    RBC 3.78 (*)    Hemoglobin 11.1 (*)    HCT 34.2 (*)    RDW 15.7 (*)    Platelets 109 (*)    All other components within normal limits  HEPATIC FUNCTION PANEL - Abnormal; Notable for the following components:   AST 53 (*)    Total Bilirubin 1.8 (*)    Bilirubin, Direct 0.3 (*)    Indirect Bilirubin 1.5 (*)    All other components within normal limits  CBG MONITORING, ED - Abnormal; Notable for the following components:   Glucose-Capillary 156 (*)    All  other components within normal limits  SARS CORONAVIRUS 2 BY RT PCR  URINALYSIS, ROUTINE W REFLEX MICROSCOPIC  TROPONIN I (HIGH SENSITIVITY)     EKG  My interpretation of EKG:  Normal sinus rate of 82 without any ST elevation or T wave inversions, left anterior fascicular block.  RADIOLOGY I have reviewed the xray personally and interpreted and patient has some right basilar atelectasis  PROCEDURES:  Critical Care performed: No  .1-3 Lead EKG Interpretation  Performed by: Concha Se, MD Authorized by: Concha Se, MD     Interpretation: normal     ECG rate:  80   ECG rate assessment: normal     Rhythm: sinus rhythm     Ectopy: none     Conduction: normal      MEDICATIONS ORDERED IN ED: Medications  sodium chloride 0.9 % bolus 1,000 mL (0 mLs Intravenous Stopped 05/29/22 1104)     IMPRESSION / MDM / ASSESSMENT AND PLAN / ED COURSE  I reviewed the triage vital signs and the nursing notes.   Patient's presentation is most consistent with acute presentation with potential threat to life or bodily function.   Patient comes in with some low blood pressures.  Otherwise asymptomatic other than feeling some intermittent lightheadedness with movements which is most  likely from the low blood pressure.  Labs ordered evaluate for heart attack, AKI, UTI etc.  Patient given a liter of fluid.  Troponin negative.  Hepatic function slightly elevated T. bili but patient had no abdominal pain.  He has had some elevation of his liver test previously.  Patient's bicarb, sodium are slightly low.  He is getting IV fluids to help with this.  His hemoglobin is stable and he denies any black stools or bleeding issues.  Reevaluated patient.  Continues to not have any pain in his right upper quadrant.  Discussed his elevated LFTs.  He reports being told he had fatty liver.  We discussed the concern that he states he does drink alcohol daily.  His platelets are kind of low which could represent some cirrhosis but discussed with him needing to follow-up with GI for further workup to ensure that he is not developing things like cirrhosis.  He expressed understanding.  He reports feeling at his baseline self and being ready for discharge home.  He denies any Tylenol, aspirin use.  His repeat troponin is negative.  Considered admission for presyncopal symptoms but given this is most likely related to dehydration potential EtOH abuse patient denies any alcohol today and does request discharge home.  He will follow-up outpatient.  We discussed holding his amlodipine and checking his blood pressures at home given he does have a blood pressure cuff and if they are showing to go elevated he can restart them at half dose and then go up to full dose if necessary.   The patient is on the cardiac monitor to evaluate for evidence of arrhythmia and/or significant heart rate changes.      FINAL CLINICAL IMPRESSION(S) / ED DIAGNOSES   Final diagnoses:  Dehydration  Hypotension, unspecified hypotension type     Rx / DC Orders   ED Discharge Orders     None        Note:  This document was prepared using Dragon voice recognition software and may include unintentional dictation errors.    Concha Se, MD 05/29/22 1236

## 2022-05-29 NOTE — ED Notes (Signed)
Pt ambulated in room without difficulty. States that he felt good standing up. States that he drove himself here and that he feels comfortable driving himself home. MD aware and agreeable to plan

## 2022-05-29 NOTE — Discharge Instructions (Addendum)
Your workup today noted some low blood pressures this could be related to some dehydration as makes sure to have breakfast in the morning and not skip it drink plenty of fluid.  You have some elevation of your liver test need to be followed up with GI to make sure not developing hepatic steatosis, cirrhosis from alcohol drinking.  I would recommend holding your amlodipine and rechecking her blood pressure the next few days in the morning.  If it staying above 140 systolic you can start off with a half of your medication or 5 mg.  Keep it this for a few days and if your blood pressures are staying elevated again you should can go back up to your normal amount of 10 mg.  You can call your primary care doctor to make a follow-up appointment as well.

## 2022-05-29 NOTE — ED Triage Notes (Signed)
Pt presents to ED and states he was sent from "back pain doctor". Report called to first nurse for hypotension in office, pt states he took BP meds today. Pt does endorse being dizzy.

## 2022-05-29 NOTE — ED Notes (Signed)
Pt verbalizes understanding of discharge instructions. Opportunity for questioning and answers were provided. Pt discharged from ED to home.   ? ?

## 2022-06-04 ENCOUNTER — Telehealth: Payer: Self-pay | Admitting: *Deleted

## 2022-06-04 NOTE — Progress Notes (Signed)
  Care Coordination   Note   06/04/2022 Name: George Gomez MRN: [id number] DOB: [birth year]  [name] is a 61 y.o. year old male who sees Aura Dials T, NP for primary care. I reached out to [name] by phone today to offer care coordination services.  [name] was given information about Care Coordination services today including:   The Care Coordination services include support from the care team which includes your Nurse Coordinator, Clinical Social Worker, or Pharmacist.  The Care Coordination team is here to help remove barriers to the health concerns and goals most important to you. Care Coordination services are voluntary, and the patient may decline or stop services at any time by request to their care team member.   Care Coordination Consent Status: Patient did not agree to participate in care coordination services at this time.  Follow up plan:  none indicated - pt appreciative but declined any need for LCSW at this time   Encounter Outcome:  Pt. Refused  Burman Nieves, CCMA Care Coordination Care Guide Direct Dial: [phone]

## 2022-06-25 ENCOUNTER — Other Ambulatory Visit: Payer: Self-pay | Admitting: Nurse Practitioner

## 2022-06-25 NOTE — Telephone Encounter (Signed)
Requested Prescriptions  Pending Prescriptions Disp Refills   losartan (COZAAR) 100 MG tablet [Pharmacy Med Name: LOSARTAN 100MG  TABLETS] 90 tablet 0    Sig: TAKE 1 TABLET BY MOUTH DAILY     Cardiovascular:  Angiotensin Receptor Blockers Failed - 06/25/2022  9:04 AM      Failed - K in normal range and within 180 days    Potassium  Date Value Ref Range Status  05/29/2022 3.4 (L) 3.5 - 5.1 mmol/L Final  09/19/2013 3.4 (L) 3.5 - 5.1 mmol/L Final         Passed - Cr in normal range and within 180 days    Creatinine  Date Value Ref Range Status  09/19/2013 1.25 0.60 - 1.30 mg/dL Final   Creatinine, Ser  Date Value Ref Range Status  05/29/2022 1.04 0.61 - 1.24 mg/dL Final         Passed - Patient is not pregnant      Passed - Last BP in normal range    BP Readings from Last 1 Encounters:  05/29/22 105/75         Passed - Valid encounter within last 6 months    Recent Outpatient Visits           2 months ago Type 2 diabetes mellitus with proteinuria (HCC)   [place] [facility] [place], [place] T, NP   5 months ago Type 2 diabetes mellitus with proteinuria (HCC)   [place] [facility] [place], Corrie Dandy T, NP   11 months ago Neuropathy   [place] [facility] [place], Avanti, MD   1 year ago Neuropathy   [place] [facility] Vigg, Avanti, MD   1 year ago Primary hypertension   [place] [facility] [place], Jake Church, NP       Future Appointments             In 4 weeks Cannady, Dorie Rank, NP [place] [facility]             tiZANidine (ZANAFLEX) 2 MG tablet [Pharmacy Med Name: TIZANIDINE 2MG  TABLETS] 30 tablet 0    Sig: TAKE 1 TABLET(2 MG) BY MOUTH EVERY 8 HOURS AS NEEDED FOR MUSCLE SPASMS     Not Delegated - Cardiovascular:  Alpha-2 Agonists - tizanidine Failed - 06/25/2022  9:04 AM      Failed - This refill cannot be delegated      Passed - Valid encounter within last 6  months    Recent Outpatient Visits           2 months ago Type 2 diabetes mellitus with proteinuria (HCC)   [place] [facility] [place], [place] T, NP   5 months ago Type 2 diabetes mellitus with proteinuria (HCC)   [place] [facility] [place], Corrie Dandy T, NP   11 months ago Neuropathy   [place] [facility] Vigg, Avanti, MD   1 year ago Neuropathy   [place] [facility] Vigg, Avanti, MD   1 year ago Primary hypertension    [facility] [place], Jake Church, NP       Future Appointments             In 4 weeks Cannady, Dorie Rank, NP  [facility]

## 2022-06-25 NOTE — Telephone Encounter (Signed)
Requested medication (s) are due for refill today: yes  Requested medication (s) are on the active medication list: yes  Last refill:  04/23/22  Future visit scheduled: yes  Notes to clinic:  Unable to refill per protocol, cannot delegate.      Requested Prescriptions  Pending Prescriptions Disp Refills   tiZANidine (ZANAFLEX) 2 MG tablet [Pharmacy Med Name: TIZANIDINE 2MG  TABLETS] 30 tablet 0    Sig: TAKE 1 TABLET(2 MG) BY MOUTH EVERY 8 HOURS AS NEEDED FOR MUSCLE SPASMS     Not Delegated - Cardiovascular:  Alpha-2 Agonists - tizanidine Failed - 06/25/2022  9:04 AM      Failed - This refill cannot be delegated      Passed - Valid encounter within last 6 months    Recent Outpatient Visits           2 months ago Type 2 diabetes mellitus with proteinuria (HCC)   [place] [facility] [place], [place] T, NP   5 months ago Type 2 diabetes mellitus with proteinuria (HCC)   [place] [facility] [place], [place] T, NP   11 months ago Neuropathy   [place] [facility] Vigg, Avanti, MD   1 year ago Neuropathy   [place] [facility] Vigg, Avanti, MD   1 year ago Primary hypertension   [place] [facility] [place], Lauren A, NP       Future Appointments             In 4 weeks Cannady, Dorie Rank, NP [place] [facility]            Signed Prescriptions Disp Refills   losartan (COZAAR) 100 MG tablet 90 tablet 0    Sig: TAKE 1 TABLET BY MOUTH DAILY     Cardiovascular:  Angiotensin Receptor Blockers Failed - 06/25/2022  9:04 AM      Failed - K in normal range and within 180 days    Potassium  Date Value Ref Range Status  05/29/2022 3.4 (L) 3.5 - 5.1 mmol/L Final  09/19/2013 3.4 (L) 3.5 - 5.1 mmol/L Final         Passed - Cr in normal range and within 180 days    Creatinine  Date Value Ref Range Status  09/19/2013 1.25 0.60 - 1.30 mg/dL Final   Creatinine, Ser  Date Value  Ref Range Status  05/29/2022 1.04 0.61 - 1.24 mg/dL Final         Passed - Patient is not pregnant      Passed - Last BP in normal range    BP Readings from Last 1 Encounters:  05/29/22 105/75         Passed - Valid encounter within last 6 months    Recent Outpatient Visits           2 months ago Type 2 diabetes mellitus with proteinuria (HCC)   [place] [facility] [place], [place] T, NP   5 months ago Type 2 diabetes mellitus with proteinuria (HCC)   [place] [facility] [place], Dorie Rank, NP   11 months ago Neuropathy   [place] [facility] Vigg, Avanti, MD   1 year ago Neuropathy   [place] [facility] Vigg, Avanti, MD   1 year ago Primary hypertension   [place] [facility] [place], NP       Future Appointments  In 4 weeks Cannady, Dorie Rank, NP [facility]

## 2022-07-23 NOTE — Patient Instructions (Incomplete)
Call the cancer center for follow-up and call orthopedics for follow-up!!!  Be Involved in Your Health Care:  Taking Medications When medications are taken as directed, they can greatly improve your health. But if they are not taken as instructed, they may not work. In some cases, not taking them correctly can be harmful. To help ensure your treatment remains effective and safe, understand your medications and how to take them.  Your lab results, notes and after visit summary will be available on My Chart. We strongly encourage you to use this feature. If lab results are abnormal the clinic will contact you with the appropriate steps. If the clinic does not contact you assume the results are satisfactory. You can always see your results on My Chart. If you have questions regarding your condition, please contact the clinic during office hours. You can also ask questions on My Chart.  We at [facility] are grateful that you chose [place] to provide care. We strive to provide excellent and compassionate care and are always looking for feedback. If you get a survey from the clinic please complete this.   Diabetes Mellitus Basics  Diabetes mellitus, or diabetes, is a long-term (chronic) disease. It occurs when the body does not properly use sugar (glucose) that is released from food after you eat. Diabetes mellitus may be caused by one or both of these problems: Your pancreas does not make enough of a hormone called insulin. Your body does not react in a normal way to the insulin that it makes. Insulin lets glucose enter cells in your body. This gives you energy. If you have diabetes, glucose cannot get into cells. This causes high blood glucose (hyperglycemia). How to treat and manage diabetes You may need to take insulin or other diabetes medicines daily to keep your glucose in balance. If you are prescribed insulin, you will learn how to give yourself insulin by injection. You may need to  adjust the amount of insulin you take based on the foods that you eat. You will need to check your blood glucose levels using a glucose monitor as told by your health care provider. The readings can help determine if you have low or high blood glucose. Generally, you should have these blood glucose levels: Before meals (preprandial): 80-130 mg/dL (0.1-0.2 mmol/L). After meals (postprandial): below 180 mg/dL (10 mmol/L). Hemoglobin A1c (HbA1c) level: less than 7%. Your health care provider will set treatment goals for you. Keep all follow-up visits. This is important. Follow these instructions at home: Diabetes medicines Take your diabetes medicines every day as told by your health care provider. List your diabetes medicines here: Name of medicine: ______________________________ Amount (dose): _______________ Time (a.m./p.m.): _______________ Notes: ___________________________________ Name of medicine: ______________________________ Amount (dose): _______________ Time (a.m./p.m.): _______________ Notes: ___________________________________ Name of medicine: ______________________________ Amount (dose): _______________ Time (a.m./p.m.): _______________ Notes: ___________________________________ Insulin If you use insulin, list the types of insulin you use here: Insulin type: ______________________________ Amount (dose): _______________ Time (a.m./p.m.): _______________Notes: ___________________________________ Insulin type: ______________________________ Amount (dose): _______________ Time (a.m./p.m.): _______________ Notes: ___________________________________ Insulin type: ______________________________ Amount (dose): _______________ Time (a.m./p.m.): _______________ Notes: ___________________________________ Insulin type: ______________________________ Amount (dose): _______________ Time (a.m./p.m.): _______________ Notes: ___________________________________ Insulin type:  ______________________________ Amount (dose): _______________ Time (a.m./p.m.): _______________ Notes: ___________________________________ Managing blood glucose  Check your blood glucose levels using a glucose monitor as told by your health care provider. Write down the times that you check your glucose levels here: Time: _______________ Notes: ___________________________________ Time: _______________ Notes: ___________________________________ Time: _______________ Notes: ___________________________________  Time: _______________ Notes: ___________________________________ Time: _______________ Notes: ___________________________________ Time: _______________ Notes: ___________________________________  Low blood glucose Low blood glucose (hypoglycemia) is when glucose is at or below 70 mg/dL (3.9 mmol/L). Symptoms may include: Feeling: Hungry. Sweaty and clammy. Irritable or easily upset. Dizzy. Sleepy. Having: A fast heartbeat. A headache. A change in your vision. Numbness around the mouth, lips, or tongue. Having trouble with: Moving (coordination). Sleeping. Treating low blood glucose To treat low blood glucose, eat or drink something containing sugar right away. If you can think clearly and swallow safely, follow the 15:15 rule: Take 15 grams of a fast-acting carb (carbohydrate), as told by your health care provider. Some fast-acting carbs are: Glucose tablets: take 3-4 tablets. Hard candy: eat 3-5 pieces. Fruit juice: drink 4 oz (120 mL). Regular (not diet) soda: drink 4-6 oz (120-180 mL). Honey or sugar: eat 1 Tbsp (15 mL). Check your blood glucose levels 15 minutes after you take the carb. If your glucose is still at or below 70 mg/dL (3.9 mmol/L), take 15 grams of a carb again. If your glucose does not go above 70 mg/dL (3.9 mmol/L) after 3 tries, get help right away. After your glucose goes back to normal, eat a meal or a snack within 1 hour. Treating very low blood  glucose If your glucose is at or below 54 mg/dL (3 mmol/L), you have very low blood glucose (severe hypoglycemia). This is an emergency. Do not wait to see if the symptoms will go away. Get medical help right away. Call your local emergency services (911 in the [place] Do not drive yourself to the hospital. Questions to ask your health care provider Should I talk with a diabetes educator? What equipment will I need to care for myself at home? What diabetes medicines do I need? When should I take them? How often do I need to check my blood glucose levels? What number can I call if I have questions? When is my follow-up visit? Where can I find a support group for people with diabetes? Where to find more information American Diabetes Association: www.diabetes.org Association of Diabetes Care and Education Specialists: www.diabeteseducator.org Contact a health care provider if: Your blood glucose is at or above 240 mg/dL (32.4 mmol/L) for 2 days in a row. You have been sick or have had a fever for 2 days or more, and you are not getting better. You have any of these problems for more than 6 hours: You cannot eat or drink. You feel nauseous. You vomit. You have diarrhea. Get help right away if: Your blood glucose is lower than 54 mg/dL (3 mmol/L). You get confused. You have trouble thinking clearly. You have trouble breathing. These symptoms may represent a serious problem that is an emergency. Do not wait to see if the symptoms will go away. Get medical help right away. Call your local emergency services (911 in the [place] Do not drive yourself to the hospital. Summary Diabetes mellitus is a chronic disease that occurs when the body does not properly use sugar (glucose) that is released from food after you eat. Take insulin and diabetes medicines as told. Check your blood glucose every day, as often as told. Keep all follow-up visits. This is important. This information is not intended to  replace advice given to you by your health care provider. Make sure you discuss any questions you have with your health care provider. Document Revised: 05/17/2019 Document Reviewed: 05/17/2019 Elsevier Patient Education  2024 [employer].

## 2022-07-24 ENCOUNTER — Ambulatory Visit (INDEPENDENT_AMBULATORY_CARE_PROVIDER_SITE_OTHER): Payer: 59 | Admitting: Nurse Practitioner

## 2022-07-24 ENCOUNTER — Encounter: Payer: Self-pay | Admitting: Nurse Practitioner

## 2022-07-24 VITALS — BP 134/65 | HR 67 | Temp 98.0°F | Wt 224.0 lb

## 2022-07-24 DIAGNOSIS — E538 Deficiency of other specified B group vitamins: Secondary | ICD-10-CM | POA: Diagnosis not present

## 2022-07-24 DIAGNOSIS — R7989 Other specified abnormal findings of blood chemistry: Secondary | ICD-10-CM

## 2022-07-24 DIAGNOSIS — D696 Thrombocytopenia, unspecified: Secondary | ICD-10-CM | POA: Diagnosis not present

## 2022-07-24 DIAGNOSIS — I152 Hypertension secondary to endocrine disorders: Secondary | ICD-10-CM | POA: Diagnosis not present

## 2022-07-24 DIAGNOSIS — E1159 Type 2 diabetes mellitus with other circulatory complications: Secondary | ICD-10-CM

## 2022-07-24 DIAGNOSIS — E1140 Type 2 diabetes mellitus with diabetic neuropathy, unspecified: Secondary | ICD-10-CM | POA: Diagnosis not present

## 2022-07-24 DIAGNOSIS — E1129 Type 2 diabetes mellitus with other diabetic kidney complication: Secondary | ICD-10-CM

## 2022-07-24 DIAGNOSIS — F1010 Alcohol abuse, uncomplicated: Secondary | ICD-10-CM

## 2022-07-24 DIAGNOSIS — N183 Chronic kidney disease, stage 3 unspecified: Secondary | ICD-10-CM | POA: Diagnosis not present

## 2022-07-24 DIAGNOSIS — E1169 Type 2 diabetes mellitus with other specified complication: Secondary | ICD-10-CM | POA: Diagnosis not present

## 2022-07-24 DIAGNOSIS — Z23 Encounter for immunization: Secondary | ICD-10-CM

## 2022-07-24 DIAGNOSIS — D649 Anemia, unspecified: Secondary | ICD-10-CM | POA: Diagnosis not present

## 2022-07-24 DIAGNOSIS — E1122 Type 2 diabetes mellitus with diabetic chronic kidney disease: Secondary | ICD-10-CM

## 2022-07-24 DIAGNOSIS — E669 Obesity, unspecified: Secondary | ICD-10-CM

## 2022-07-24 LAB — BAYER DCA HB A1C WAIVED: HB A1C (BAYER DCA - WAIVED): 6.1 % — ABNORMAL HIGH (ref 4.8–5.6)

## 2022-07-24 MED ORDER — ONETOUCH ULTRASOFT LANCETS MISC: 12 refills | Status: AC

## 2022-07-24 MED ORDER — ONETOUCH VERIO VI STRP: ORAL_STRIP | 12 refills | Status: AC

## 2022-07-24 MED ORDER — ONETOUCH VERIO W/DEVICE KIT: PACK | 0 refills | Status: AC

## 2022-07-24 NOTE — Assessment & Plan Note (Signed)
Chronic, he reports a few shots vodka daily.  Check CMP today and consider imaging if elevations.  Recommend working towards complete cessation of alcohol use. 

## 2022-07-24 NOTE — Progress Notes (Signed)
BP 134/65   Pulse 67   Temp 98 F (36.7 C) (Oral)   Wt 224 lb (101.6 kg)   SpO2 96%   BMI 31.24 kg/m    Subjective:    Patient ID: George Gomez, male    DOB: September 09, 1960, 62 y.o.   MRN: [id number]  HPI: George Gomez is a 62 y.o. male  Chief Complaint  Patient presents with   Diabetes    Pt states he has not had a recent eye exam    Hyperlipidemia   Hypertension   DIABETES Continues on Metformin XL 500 MG daily.  March 2024 A1c 6.4%. Hypoglycemic episodes:no Polydipsia/polyuria: no Visual disturbance: no Chest pain: no Paresthesias: no Glucose Monitoring: no  Accucheck frequency: Not Checking  Fasting glucose:  Post prandial:  Evening:  Before meals: Taking Insulin?: no  Long acting insulin:  Short acting insulin: Blood Pressure Monitoring: not checking Retinal Examination: Not up to Date -- [facility] Foot Exam: Up to Date Pneumovax: Refuses Influenza: Up to Date Aspirin: no   CHRONIC KIDNEY DISEASE Improving recent labs.  Last saw nephrology 01/29/22. CKD status: stable Medications renally dose: yes Previous renal evaluation: no Pneumovax:  Not up to Date Influenza Vaccine:  Up to Date   HYPERTENSION / HYPERLIPIDEMIA Continues on Amlodipine, Losartan, Propranolol, + Atorvastatin.   Drinks a shot or two of liquor daily -- vodka, sometimes a little more if at a party or cook out. Have noticed elevation in LFTs on labs in past. Satisfied with current treatment? yes Duration of hypertension: chronic BP monitoring frequency: two times a week BP range: 120-130/70 range BP medication side effects: no Duration of hyperlipidemia: chronic Cholesterol medication side effects: no Cholesterol supplements: none Medication compliance: good compliance Aspirin: no Recent stressors: no Recurrent headaches: no Visual changes: no Palpitations: no Dyspnea: no Chest pain: no Lower extremity edema: occasional Dizzy/lightheaded: no  ANEMIA Followed by  hematology for low B12 and anemia, last visit 11/26/21.  Obtains B12 shots, last was in October.  Missed recent visit with them.  Continues Protonix for stomach and GERD + has low Mag levels recently.  States he is taking a supplement Anemia status: stable Etiology of anemia: B12 and iron deficiency Duration of anemia treatment: via hematology Compliance with treatment: good compliance Iron supplementation side effects: no Severity of anemia: moderate Fatigue: no Decreased exercise tolerance: no  Dyspnea on exertion: no Palpitations: no Bleeding: no Pica: no   Relevant past medical, surgical, family and social history reviewed and updated as indicated. Interim medical history since our last visit reviewed. Allergies and medications reviewed and updated.  Review of Systems  Constitutional:  Negative for activity change, diaphoresis, fatigue and fever.  Respiratory:  Negative for cough, chest tightness, shortness of breath and wheezing.   Cardiovascular:  Negative for chest pain, palpitations and leg swelling.  Endocrine: Negative for cold intolerance, heat intolerance, polydipsia, polyphagia and polyuria.  Musculoskeletal:  Positive for arthralgias.  Neurological:  Negative for dizziness, syncope, weakness, light-headedness, numbness and headaches.  Psychiatric/Behavioral: Negative.      Per HPI unless specifically indicated above     Objective:    BP 134/65   Pulse 67   Temp 98 F (36.7 C) (Oral)   Wt 224 lb (101.6 kg)   SpO2 96%   BMI 31.24 kg/m   Wt Readings from Last 3 Encounters:  07/24/22 224 lb (101.6 kg)  05/29/22 224 lb (101.6 kg)  04/23/22 219 lb 11.2 oz (99.7 kg)    Physical  Exam Vitals and nursing note reviewed.  Constitutional:      General: He is awake. He is not in acute distress.    Appearance: He is well-developed and well-groomed. He is obese. He is not ill-appearing or toxic-appearing.  HENT:     Head: Normocephalic and atraumatic.     Right Ear:  Hearing and external ear normal. No drainage.     Left Ear: Hearing and external ear normal. No drainage.  Eyes:     General: Lids are normal.        Right eye: No discharge.        Left eye: No discharge.     Conjunctiva/sclera: Conjunctivae normal.     Pupils: Pupils are equal, round, and reactive to light.  Neck:     Thyroid: No thyromegaly.     Vascular: No carotid bruit.  Cardiovascular:     Rate and Rhythm: Normal rate and regular rhythm.     Heart sounds: Normal heart sounds, S1 normal and S2 normal. No murmur heard.    No gallop.  Pulmonary:     Effort: Pulmonary effort is normal. No accessory muscle usage or respiratory distress.     Breath sounds: Normal breath sounds.  Abdominal:     General: Bowel sounds are normal.     Palpations: Abdomen is soft.  Musculoskeletal:     Cervical back: Normal range of motion and neck supple.     Right lower leg: 2+ Edema (ankles) present.     Left lower leg: 2+ Edema (ankles) present.  Lymphadenopathy:     Cervical: No cervical adenopathy.  Skin:    General: Skin is warm and dry.     Capillary Refill: Capillary refill takes less than 2 seconds.     Findings: No rash.  Neurological:     Mental Status: He is alert and oriented to person, place, and time.     Cranial Nerves: Cranial nerves 2-12 are intact.     Sensory: Sensation is intact.     Coordination: Coordination is intact.     Gait: Gait abnormal (antalgic gait present).     Deep Tendon Reflexes: Reflexes are normal and symmetric.     Reflex Scores:      Brachioradialis reflexes are 2+ on the right side and 2+ on the left side.      Patellar reflexes are 2+ on the right side and 2+ on the left side. Psychiatric:        Attention and Perception: Attention normal.        Mood and Affect: Mood normal.        Speech: Speech normal.        Behavior: Behavior normal. Behavior is cooperative.        Thought Content: Thought content normal.     Results for orders placed or  performed during the hospital encounter of 05/29/22  SARS Coronavirus 2 by RT PCR (hospital order, performed in [facility] hospital lab) *cepheid single result test* Anterior Nasal Swab   Specimen: Anterior Nasal Swab  Result Value Ref Range   SARS Coronavirus 2 by RT PCR NEGATIVE NEGATIVE  Basic metabolic panel  Result Value Ref Range   Sodium 130 (L) 135 - 145 mmol/L   Potassium 3.4 (L) 3.5 - 5.1 mmol/L   Chloride 98 98 - 111 mmol/L   CO2 18 (L) 22 - 32 mmol/L   Glucose, Bld 157 (H) 70 - 99 mg/dL   BUN 17 8 - 23 mg/dL  Creatinine, Ser 1.04 0.61 - 1.24 mg/dL   Calcium 8.3 (L) 8.9 - 10.3 mg/dL   GFR, Estimated >81 >19 mL/min   Anion gap 14 5 - 15  CBC  Result Value Ref Range   WBC 3.7 (L) 4.0 - 10.5 K/uL   RBC 3.78 (L) 4.22 - 5.81 MIL/uL   Hemoglobin 11.1 (L) 13.0 - 17.0 g/dL   HCT 14.7 (L) 82.9 - 56.2 %   MCV 90.5 80.0 - 100.0 fL   MCH 29.4 26.0 - 34.0 pg   MCHC 32.5 30.0 - 36.0 g/dL   RDW 13.0 (H) 86.5 - 78.4 %   Platelets 109 (L) 150 - 400 K/uL   nRBC 0.0 0.0 - 0.2 %  Hepatic function panel  Result Value Ref Range   Total Protein 7.5 6.5 - 8.1 g/dL   Albumin 3.8 3.5 - 5.0 g/dL   AST 53 (H) 15 - 41 U/L   ALT 27 0 - 44 U/L   Alkaline Phosphatase 79 38 - 126 U/L   Total Bilirubin 1.8 (H) 0.3 - 1.2 mg/dL   Bilirubin, Direct 0.3 (H) 0.0 - 0.2 mg/dL   Indirect Bilirubin 1.5 (H) 0.3 - 0.9 mg/dL  CBG monitoring, ED  Result Value Ref Range   Glucose-Capillary 156 (H) 70 - 99 mg/dL  Troponin I (High Sensitivity)  Result Value Ref Range   Troponin I (High Sensitivity) 10 <18 ng/L  Troponin I (High Sensitivity)  Result Value Ref Range   Troponin I (High Sensitivity) 8 <18 ng/L      Assessment & Plan:   Problem List Items Addressed This Visit       Cardiovascular and Mediastinum   Hypertension associated with type 2 diabetes mellitus (HCC)    Chronic, stable.  BP at goal in office.  Recommend he monitor BP at least a few mornings a week at home and document.  DASH  diet at home.  Continue current medication regimen and adjust as needed.  Labs today: CMP.  Urine ALB 80 March 2024, continue Losartan for kidney protection.  Return in 6 months.        Relevant Orders   Bayer DCA Hb A1c Waived   Comprehensive metabolic panel     Endocrine   CKD stage 3 due to type 2 diabetes mellitus (HCC)    Chronic, ongoing with A1c 6.1% today.  At this time continue Metformin as ordered and monitor closely, renal dose as needed.  Urine ALB 80 (March 2024), continue Losartan for kidney protection -- CKD stable on recent labs.  Continue Gabapentin for neuropathy pain.  Recommend he check BS 3 days a week and document for provider visits.  Return in 6 months. - Foot exam up to date - Needs to schedule eye exam - Vaccines up to date - ARB and statin on board.      Relevant Orders   Bayer DCA Hb A1c Waived   Comprehensive metabolic panel   Hyperlipidemia due to type 2 diabetes mellitus (HCC)    Chronic, ongoing.  Continue current medication regimen and adjust as needed.  Lipid panel today.        Relevant Orders   Comprehensive metabolic panel   Lipid Panel w/o Chol/HDL Ratio   Type 2 diabetes mellitus with diabetic neuropathy (HCC)    Chronic, ongoing with A1c 6.1% today.  At this time continue Metformin as ordered and monitor closely, renal dose as needed.  Urine ALB 80 (March 2024), continue Losartan for kidney protection --  CKD stable on recent labs.  Continue Gabapentin for neuropathy pain.  Recommend he check BS 3 days a week and document for provider visits.  Return in 6 months. - Foot exam up to date - Needs to schedule eye exam - Vaccines up to date - ARB and statin on board.      Relevant Orders   Bayer DCA Hb A1c Waived   Type 2 diabetes mellitus with proteinuria (HCC) - Primary    Chronic, ongoing with A1c 6.1% today.  At this time continue Metformin as ordered and monitor closely, renal dose as needed.  Urine ALB 80 (March 2024), continue Losartan  for kidney protection -- CKD stable on recent labs.  Continue Gabapentin for neuropathy pain.  Recommend he check BS 3 days a week and document for provider visits.  Return in 6 months. - Foot exam up to date - Needs to schedule eye exam - Vaccines up to date - ARB and statin on board.      Relevant Orders   Bayer DCA Hb A1c Waived     Hematopoietic and Hemostatic   Thrombocytopenia (HCC)    Chronic, ongoing.  Followed by hematology and recent notes reviewed.  Recommend he schedule follow-up with them.      Relevant Orders   CBC with Differential/Platelet     Other   Alcohol abuse    Chronic, he reports a few shots vodka daily.  Check CMP today and consider imaging if elevations.  Recommend working towards complete cessation of alcohol use.      Relevant Orders   Comprehensive metabolic panel   Z61 deficiency    Chronic, ongoing.  Followed by hematology and recent notes reviewed.  Recommend he schedule follow-up with them.      Hypomagnesemia   Relevant Orders   Magnesium   Normocytic anemia    Chronic, ongoing.  Followed by hematology and recent notes reviewed.  Recommend he schedule follow-up with them.      Obesity (BMI 30-39.9)    BMI 31.24.  Recommended eating smaller high protein, low fat meals more frequently and exercising 30 mins a day 5 times a week with a goal of 10-15lb weight loss in the next 3 months. Patient voiced their understanding and motivation to adhere to these recommendations.       Other Visit Diagnoses     Pneumococcal vaccination given       PCV20 in office today, educated on this and diabetes.   Relevant Orders   Pneumococcal conjugate vaccine 20-valent (Prevnar 20)   Need for Td vaccine       Td vaccine in office today, educated on this.   Relevant Orders   Td vaccine greater than or equal to 7yo preservative free IM        Follow up plan: Return in about 6 months (around 01/23/2023) for T2DM, HTN/HLD, ANEMIA, CHRONIC  PAIN.

## 2022-07-24 NOTE — Assessment & Plan Note (Signed)
Chronic, ongoing.  Continue current medication regimen and adjust as needed. Lipid panel today. 

## 2022-07-24 NOTE — Assessment & Plan Note (Signed)
Chronic, ongoing.  Followed by hematology and recent notes reviewed.  Recommend he schedule follow-up with them. 

## 2022-07-24 NOTE — Assessment & Plan Note (Addendum)
Chronic, stable.  BP at goal in office.  Recommend he monitor BP at least a few mornings a week at home and document.  DASH diet at home.  Continue current medication regimen and adjust as needed.  Labs today: CMP.  Urine ALB 80 March 2024, continue Losartan for kidney protection.  Return in 6 months.

## 2022-07-24 NOTE — Assessment & Plan Note (Signed)
Chronic, ongoing with A1c 6.1% today.  At this time continue Metformin as ordered and monitor closely, renal dose as needed.  Urine ALB 80 (March 2024), continue Losartan for kidney protection -- CKD stable on recent labs.  Continue Gabapentin for neuropathy pain.  Recommend he check BS 3 days a week and document for provider visits.  Return in 6 months. - Foot exam up to date - Needs to schedule eye exam - Vaccines up to date - ARB and statin on board. 

## 2022-07-24 NOTE — Assessment & Plan Note (Signed)
BMI 31.24.  Recommended eating smaller high protein, low fat meals more frequently and exercising 30 mins a day 5 times a week with a goal of 10-15lb weight loss in the next 3 months. Patient voiced their understanding and motivation to adhere to these recommendations.  

## 2022-07-24 NOTE — Assessment & Plan Note (Signed)
Chronic, ongoing with A1c 6.1% today.  At this time continue Metformin as ordered and monitor closely, renal dose as needed.  Urine ALB 80 (March 2024), continue Losartan for kidney protection -- CKD stable on recent labs.  Continue Gabapentin for neuropathy pain.  Recommend he check BS 3 days a week and document for provider visits.  Return in 6 months. - Foot exam up to date - Needs to schedule eye exam - Vaccines up to date - ARB and statin on board.

## 2022-07-24 NOTE — Assessment & Plan Note (Signed)
Chronic, ongoing.  Followed by hematology and recent notes reviewed.  Recommend he schedule follow-up with them.

## 2022-07-25 ENCOUNTER — Other Ambulatory Visit: Payer: Self-pay | Admitting: Nurse Practitioner

## 2022-07-25 DIAGNOSIS — E1140 Type 2 diabetes mellitus with diabetic neuropathy, unspecified: Secondary | ICD-10-CM

## 2022-07-25 DIAGNOSIS — E1129 Type 2 diabetes mellitus with other diabetic kidney complication: Secondary | ICD-10-CM

## 2022-07-25 LAB — COMPREHENSIVE METABOLIC PANEL
BUN/Creatinine Ratio: 17 (ref 10–24)
Bilirubin Total: <0.2 mg/dL (ref 0.0–1.2)
Creatinine, Ser: 1.18 mg/dL (ref 0.76–1.27)
eGFR: 69 mL/min/{1.73_m2} (ref >59)

## 2022-07-25 LAB — CBC WITH DIFFERENTIAL/PLATELET

## 2022-07-25 LAB — LIPID PANEL W/O CHOL/HDL RATIO: Cholesterol, Total: 189 mg/dL (ref 100–199)

## 2022-07-25 LAB — MAGNESIUM: Magnesium: 1.1 mg/dL — ABNORMAL LOW (ref 1.6–2.3)

## 2022-07-25 MED ORDER — MAGNESIUM OXIDE -MG SUPPLEMENT 400 (240 MG) MG PO TABS: 400.0000 mg | ORAL_TABLET | Freq: Two times a day (BID) | ORAL | 4 refills | Status: DC

## 2022-07-25 MED ORDER — METFORMIN HCL ER 500 MG PO TB24: 500.0000 mg | ORAL_TABLET | Freq: Every day | ORAL | 4 refills | Status: DC

## 2022-07-25 NOTE — Progress Notes (Signed)
Contacted via MyChart, however he does not often check, so please call too + needs lab only visit in 4 weeks please:  Good afternoon George Gomez, your labs have returned: - Magnesium remains very low, which can make you feel bad.  Please ensure you are taking the Magnesium supplement we ordered twice a day.  I have sent in refills.  Plus cut back on alcohol use.  You are on Protonix for heart burn and at times this can cause low magnesium levels as well.  I would like to recheck in 4 weeks via outpatient lab visit, which my staff will help schedule. - Kidney and liver function are normal. - Cholesterol levels remain stable. - Still waiting on blood counts and if abnormal will let you know.  Any questions? Keep being amazing!!  Thank you for allowing me to participate in your care.  I appreciate you. Kindest regards, Daria Mcmeekin

## 2022-07-26 LAB — COMPREHENSIVE METABOLIC PANEL
ALT: 18 IU/L (ref 0–44)
AST: 28 IU/L (ref 0–40)
Albumin: 4.1 g/dL (ref 3.9–4.9)
Alkaline Phosphatase: 111 IU/L (ref 44–121)
BUN: 20 mg/dL (ref 8–27)
CO2: 21 mmol/L (ref 20–29)
Calcium: 9.5 mg/dL (ref 8.6–10.2)
Chloride: 106 mmol/L (ref 96–106)
Globulin, Total: 3 g/dL (ref 1.5–4.5)
Glucose: 112 mg/dL — ABNORMAL HIGH (ref 70–99)
Potassium: 3.5 mmol/L (ref 3.5–5.2)
Sodium: 144 mmol/L (ref 134–144)
Total Protein: 7.1 g/dL (ref 6.0–8.5)

## 2022-07-26 LAB — CBC WITH DIFFERENTIAL/PLATELET
Basos: 1 %
Eos: 3 %
Hemoglobin: 10 g/dL — ABNORMAL LOW (ref 13.0–17.7)
Immature Granulocytes: 2 %
Lymphocytes Absolute: 0.9 10*3/uL (ref 0.7–3.1)
Lymphs: 41 %
MCH: 29.4 pg (ref 26.6–33.0)
MCHC: 31.8 g/dL (ref 31.5–35.7)
Neutrophils Absolute: 0.8 10*3/uL — ABNORMAL LOW (ref 1.4–7.0)
Neutrophils: 38 %
Platelets: 221 10*3/uL (ref 150–450)
RDW: 16.2 % — ABNORMAL HIGH (ref 11.6–15.4)
WBC: 2.1 10*3/uL — CL (ref 3.4–10.8)

## 2022-07-26 LAB — LIPID PANEL W/O CHOL/HDL RATIO
HDL: 64 mg/dL (ref >39)
LDL Chol Calc (NIH): 85 mg/dL (ref 0–99)
Triglycerides: 241 mg/dL — ABNORMAL HIGH (ref 0–149)
VLDL Cholesterol Cal: 40 mg/dL (ref 5–40)

## 2022-07-28 NOTE — Progress Notes (Signed)
Good day, please let George Gomez know his labs have returned: Good afternoon George Gomez, your labs have returned: - Magnesium remains very low, which can make you feel bad.  Please ensure you are taking the Magnesium supplement we ordered twice a day.  I have sent in refills.  Plus cut back on alcohol use.  You are on Protonix for heart burn and at times this can cause low magnesium levels as well.  I would like to recheck in 4 weeks via outpatient lab visit, which my staff will help schedule. - Kidney and liver function are normal. - Cholesterol levels remain stable. - CBC (blood counts) do show some concerns -- there is ongoing anemia and you white blood cell count is low -- these help protect you against infection.  You last saw hematology (the blood doctors) on 11/26/21.  I need you to call them ASAP at  [phone] to schedule visit for this.  Any questions? Keep being amazing!!  Thank you for allowing me to participate in your care.  I appreciate you. Kindest regards, Hayward Rylander

## 2022-07-28 NOTE — Progress Notes (Signed)
Sending CBC to you for review, I sent you a staff message on this:) Lauren I think you are covering her inbox.  FYI.

## 2022-08-06 ENCOUNTER — Other Ambulatory Visit: Payer: Self-pay | Admitting: *Deleted

## 2022-08-06 DIAGNOSIS — D509 Iron deficiency anemia, unspecified: Secondary | ICD-10-CM

## 2022-08-14 ENCOUNTER — Telehealth: Payer: Self-pay | Admitting: Nurse Practitioner

## 2022-08-14 DIAGNOSIS — K219 Gastro-esophageal reflux disease without esophagitis: Secondary | ICD-10-CM

## 2022-08-14 DIAGNOSIS — R7989 Other specified abnormal findings of blood chemistry: Secondary | ICD-10-CM

## 2022-08-14 DIAGNOSIS — D649 Anemia, unspecified: Secondary | ICD-10-CM

## 2022-08-14 DIAGNOSIS — F1010 Alcohol abuse, uncomplicated: Secondary | ICD-10-CM

## 2022-08-14 NOTE — Telephone Encounter (Signed)
Copied from CRM [id number]. Topic: Referral - Request for Referral >> Aug 14, 2022 11:46 AM Patsy Lager T wrote: Has patient seen PCP for this complaint? Yes.   *If NO, is insurance requiring patient see PCP for this issue before PCP can refer them? Referral for which specialty: Gastroenterologist Preferred provider/office: [facility] Reason for referral: stomach issues

## 2022-08-20 ENCOUNTER — Ambulatory Visit: Payer: 59 | Admitting: Nurse Practitioner

## 2022-08-20 ENCOUNTER — Other Ambulatory Visit: Payer: 59

## 2022-08-20 DIAGNOSIS — D696 Thrombocytopenia, unspecified: Secondary | ICD-10-CM

## 2022-08-21 LAB — CBC WITH DIFFERENTIAL/PLATELET
Basophils Absolute: 0 10*3/uL (ref 0.0–0.2)
Basos: 1 %
EOS (ABSOLUTE): 0.1 10*3/uL (ref 0.0–0.4)
Eos: 3 %
Hematocrit: 30.8 % — ABNORMAL LOW (ref 37.5–51.0)
Hemoglobin: 10.1 g/dL — ABNORMAL LOW (ref 13.0–17.7)
Immature Grans (Abs): 0 10*3/uL (ref 0.0–0.1)
Immature Granulocytes: 1 %
Lymphocytes Absolute: 0.8 10*3/uL (ref 0.7–3.1)
Lymphs: 25 %
MCH: 30.1 pg (ref 26.6–33.0)
MCHC: 32.8 g/dL (ref 31.5–35.7)
MCV: 92 fL (ref 79–97)
Monocytes Absolute: 0.5 10*3/uL (ref 0.1–0.9)
Monocytes: 15 %
Neutrophils Absolute: 1.9 10*3/uL (ref 1.4–7.0)
Neutrophils: 55 %
Platelets: 207 10*3/uL (ref 150–450)
RBC: 3.35 x10E6/uL — ABNORMAL LOW (ref 4.14–5.80)
WBC: 3.3 10*3/uL — ABNORMAL LOW (ref 3.4–10.8)

## 2022-08-21 NOTE — Progress Notes (Signed)
Please call patient:  Good morning George Gomez, your labs have returned and continue to show a low white blood cell count and some anemia.  I want you to ensure to attend your visit with Dr. Smith Robert, with hematology, on 09/26/22.  Do not miss this visit, as it is very important for further work-up for these abnormal levels.  Any questions? Keep being amazing!!  Thank you for allowing me to participate in your care.  I appreciate you. Kindest regards, Anastassia Noack

## 2022-09-12 ENCOUNTER — Emergency Department
Admission: EM | Admit: 2022-09-12 | Discharge: 2022-09-12 | Disposition: A | Discharge status: Home / Self Care | Payer: 59 | Attending: Emergency Medicine | Admitting: Emergency Medicine

## 2022-09-12 ENCOUNTER — Other Ambulatory Visit: Payer: Self-pay

## 2022-09-12 DIAGNOSIS — E1122 Type 2 diabetes mellitus with diabetic chronic kidney disease: Secondary | ICD-10-CM | POA: Insufficient documentation

## 2022-09-12 DIAGNOSIS — N189 Chronic kidney disease, unspecified: Secondary | ICD-10-CM | POA: Insufficient documentation

## 2022-09-12 DIAGNOSIS — R220 Localized swelling, mass and lump, head: Secondary | ICD-10-CM | POA: Diagnosis present

## 2022-09-12 DIAGNOSIS — T783XXA Angioneurotic edema, initial encounter: Secondary | ICD-10-CM | POA: Diagnosis not present

## 2022-09-12 MED ORDER — PREDNISONE 20 MG PO TABS
60.0000 mg | ORAL_TABLET | Freq: Once | ORAL | Status: AC
  Administered 2022-09-12: 60 mg via ORAL
  Filled 2022-09-12: qty 3

## 2022-09-12 MED ORDER — PREDNISONE 20 MG PO TABS: 40.0000 mg | ORAL_TABLET | Freq: Every day | ORAL | 0 refills | Status: AC

## 2022-09-12 NOTE — Discharge Instructions (Addendum)
Please take your prednisone as written for the next 5 days.  Please follow-up with your doctor for recheck/reevaluation.  As we discussed please discontinue use of your Cozaar/losartan medication.  Return to the emergency department immediately for any worsening swelling, any swelling of the tongue or throat any trouble breathing or any symptom personally concerning to yourself.

## 2022-09-12 NOTE — ED Triage Notes (Signed)
Pt comes with c/o facial swelling to left side. Pt denies any bad tooth. Pt states he is on lisinopril. Pt states little pain.   Pt denies any trouble swallowing or breathing. Pt speaking in complete sentences.

## 2022-09-12 NOTE — ED Provider Notes (Signed)
[facility] Provider Note    Event Date/Time   First MD Initiated Contact with Patient 09/12/22 1013     (approximate)  History   Chief Complaint: Facial Swelling  HPI  George Gomez is a 62 y.o. male with a past medical history of CKD, diabetes, angioedema, presents to the emergency department for facial swelling.  According to the patient he woke up this morning with swelling to his upper and lower lip of his face.  Denies any rash or itching.  Denies any trouble breathing.  Denies any tongue or throat swelling.  Patient states several years ago this happened as well and the patient was taken off of lisinopril and placed on losartan.  States he did not take any of his medications yet this morning.  Patient states already he feels like the swelling is going down compared to this morning.  Physical Exam   Triage Vital Signs: ED Triage Vitals  Encounter Vitals Group     BP 09/12/22 0955 132/83     Systolic BP Percentile --      Diastolic BP Percentile --      Pulse Rate 09/12/22 0955 (!) 106     Resp 09/12/22 0955 18     Temp 09/12/22 0955 98.7 F (37.1 C)     Temp src --      SpO2 09/12/22 0955 95 %     Weight --      Height --      Head Circumference --      Peak Flow --      Pain Score 09/12/22 0953 2     Pain Loc --      Pain Education --      Exclude from Growth Chart --     Most recent vital signs: Vitals:   09/12/22 0955  BP: 132/83  Pulse: (!) 106  Resp: 18  Temp: 98.7 F (37.1 C)  SpO2: 95%    General: Awake, no distress.  CV:  Good peripheral perfusion.  Regular rate and rhythm  Resp:  Normal effort.  Equal breath sounds bilaterally.  Abd:  No distention.  Soft, nontender.  No rebound or guarding. Other:  Mild swelling of the upper and lower lip which appears to be bilaterally.  No tongue swelling noted, widely patent oropharynx.   ED Results / Procedures / Treatments   MEDICATIONS ORDERED IN ED: Medications  predniSONE  (DELTASONE) tablet 60 mg (has no administration in time range)     IMPRESSION / MDM / ASSESSMENT AND PLAN / ED COURSE  I reviewed the triage vital signs and the nursing notes.  Patient's presentation is most consistent with acute presentation with potential threat to life or bodily function.  Patient presents emergency department for what appears to be angioedema of the upper and lower lip.  History of angioedema previously thought to be due to lisinopril taken off lisinopril and placed on losartan.  No other signs of allergic reaction such as itching hives or rash.  Given the patient's swelling highly suspect angioedema once again.  Could possibly be related to losartan use but could also be unrelated to the patient's blood pressure medication.  I discussed with the patient the need to discontinue losartan and again reiterated that he is not to take any lisinopril.  Patient's blood pressure is well-controlled today and he has not taken any of his blood pressure medications he is also on propranolol ER and amlodipine.  I discussed with the patient  to restart his home medications but to not take his losartan/Cozaar and again reiterated not to take any ACE inhibitors.  We will dose prednisone to see if this helps with the swelling and continue to closely monitor.  Patient denies any trouble breathing denies any tongue or throat swelling appears widely patent on exam but there is mild edema of the upper and lower lip.  Patient states he feels like the swelling has already started to come down compared to this morning.  We will reassess after prednisone as long the patient is feeling better anticipate likely discharge home on prednisone to restart his home medications besides Cozaar.  Patient states he is feeling better.  States he feels like the swelling is going down.  We will discharge with a 5-day course of prednisone discussed with the patient not to take his Cozaar and to follow-up with his doctor.  I  discussed return precautions for any return of/worsening swelling any trouble breathing or any swelling of the throat or tongue.  FINAL CLINICAL IMPRESSION(S) / ED DIAGNOSES   Angioedema   Note:  This document was prepared using Dragon voice recognition software and may include unintentional dictation errors.   Minna Antis, MD 09/12/22 1201

## 2022-09-19 ENCOUNTER — Telehealth: Payer: Self-pay | Admitting: *Deleted

## 2022-09-19 NOTE — Telephone Encounter (Signed)
Transition Care Management Unsuccessful Follow-up Telephone Call  Date of discharge and from where:  [facility]  09/12/2022  Attempts: 1st  Reason for unsuccessful TCM follow-up call:  No answer/busy

## 2022-09-23 ENCOUNTER — Telehealth: Payer: Self-pay | Admitting: *Deleted

## 2022-09-23 NOTE — Telephone Encounter (Signed)
Transition Care Management Unsuccessful Follow-up Telephone Call  Date of discharge and from where:  [facility]   09/12/2022  Attempts:  2nd Attempt  Reason for unsuccessful TCM follow-up call:  No answer/busy

## 2022-09-26 ENCOUNTER — Inpatient Hospital Stay: Payer: 59 | Admitting: Oncology

## 2022-10-01 ENCOUNTER — Other Ambulatory Visit: Payer: Self-pay | Admitting: Nurse Practitioner

## 2022-10-02 ENCOUNTER — Inpatient Hospital Stay: Payer: 59 | Attending: Oncology

## 2022-10-02 DIAGNOSIS — N189 Chronic kidney disease, unspecified: Secondary | ICD-10-CM | POA: Insufficient documentation

## 2022-10-02 DIAGNOSIS — R7401 Elevation of levels of liver transaminase levels: Secondary | ICD-10-CM | POA: Insufficient documentation

## 2022-10-02 DIAGNOSIS — E538 Deficiency of other specified B group vitamins: Secondary | ICD-10-CM | POA: Insufficient documentation

## 2022-10-02 DIAGNOSIS — D509 Iron deficiency anemia, unspecified: Secondary | ICD-10-CM | POA: Insufficient documentation

## 2022-10-02 DIAGNOSIS — D61818 Other pancytopenia: Secondary | ICD-10-CM | POA: Insufficient documentation

## 2022-10-02 NOTE — Telephone Encounter (Signed)
Requested Prescriptions  Pending Prescriptions Disp Refills   losartan (COZAAR) 100 MG tablet [Pharmacy Med Name: LOSARTAN 100MG  TABLETS] 90 tablet 0    Sig: TAKE 1 TABLET BY MOUTH DAILY     Cardiovascular:  Angiotensin Receptor Blockers Failed - 10/01/2022  5:14 PM      Failed - Last BP in normal range    BP Readings from Last 1 Encounters:  09/12/22 (!) 145/75         Passed - Cr in normal range and within 180 days    Creatinine  Date Value Ref Range Status  09/19/2013 1.25 0.60 - 1.30 mg/dL Final   Creatinine, Ser  Date Value Ref Range Status  07/24/2022 1.18 0.76 - 1.27 mg/dL Final         Passed - K in normal range and within 180 days    Potassium  Date Value Ref Range Status  07/24/2022 3.5 3.5 - 5.2 mmol/L Final  09/19/2013 3.4 (L) 3.5 - 5.1 mmol/L Final         Passed - Patient is not pregnant      Passed - Valid encounter within last 6 months    Recent Outpatient Visits           2 months ago Type 2 diabetes mellitus with proteinuria (HCC)   [place] [facility] [place], [place] T, NP   5 months ago Type 2 diabetes mellitus with proteinuria (HCC)   [place] [facility] [place], Jolene T, NP   9 months ago Type 2 diabetes mellitus with proteinuria (HCC)   [place] [facility] [place], Corrie Dandy T, NP   1 year ago Neuropathy   [place] [facility] V[place], Avanti, MD   1 year ago Neuropathy   [place] Vigg, Avanti, MD       Future Appointments             In 3 months [place], Dorie Rank, NP [place] [facility]

## 2022-10-15 ENCOUNTER — Inpatient Hospital Stay (HOSPITAL_BASED_OUTPATIENT_CLINIC_OR_DEPARTMENT_OTHER): Payer: 59 | Admitting: Oncology

## 2022-10-15 ENCOUNTER — Encounter: Payer: Self-pay | Admitting: Oncology

## 2022-10-15 ENCOUNTER — Inpatient Hospital Stay: Payer: 59

## 2022-10-15 VITALS — BP 133/79 | HR 85 | Temp 98.9°F | Resp 16 | Wt 224.2 lb

## 2022-10-15 DIAGNOSIS — D631 Anemia in chronic kidney disease: Secondary | ICD-10-CM

## 2022-10-15 DIAGNOSIS — D509 Iron deficiency anemia, unspecified: Secondary | ICD-10-CM | POA: Diagnosis not present

## 2022-10-15 DIAGNOSIS — E538 Deficiency of other specified B group vitamins: Secondary | ICD-10-CM | POA: Diagnosis not present

## 2022-10-15 DIAGNOSIS — N183 Chronic kidney disease, stage 3 unspecified: Secondary | ICD-10-CM

## 2022-10-15 DIAGNOSIS — D649 Anemia, unspecified: Secondary | ICD-10-CM

## 2022-10-15 DIAGNOSIS — R7401 Elevation of levels of liver transaminase levels: Secondary | ICD-10-CM | POA: Diagnosis not present

## 2022-10-15 DIAGNOSIS — N189 Chronic kidney disease, unspecified: Secondary | ICD-10-CM | POA: Diagnosis not present

## 2022-10-15 DIAGNOSIS — D61818 Other pancytopenia: Secondary | ICD-10-CM | POA: Diagnosis not present

## 2022-10-15 LAB — CBC WITH DIFFERENTIAL/PLATELET
Abs Immature Granulocytes: 0.07 10*3/uL (ref 0.00–0.07)
Basophils Absolute: 0 10*3/uL (ref 0.0–0.1)
Basophils Relative: 0 %
Eosinophils Absolute: 0 10*3/uL (ref 0.0–0.5)
Eosinophils Relative: 1 %
HCT: 29.6 % — ABNORMAL LOW (ref 39.0–52.0)
Hemoglobin: 9.5 g/dL — ABNORMAL LOW (ref 13.0–17.0)
Immature Granulocytes: 1 %
Lymphocytes Relative: 13 %
Lymphs Abs: 0.7 10*3/uL (ref 0.7–4.0)
MCH: 30.7 pg (ref 26.0–34.0)
MCHC: 32.1 g/dL (ref 30.0–36.0)
MCV: 95.8 fL (ref 80.0–100.0)
Monocytes Absolute: 1 10*3/uL (ref 0.1–1.0)
Monocytes Relative: 19 %
Neutro Abs: 3.6 10*3/uL (ref 1.7–7.7)
Neutrophils Relative %: 66 %
Platelets: 212 10*3/uL (ref 150–400)
RBC: 3.09 MIL/uL — ABNORMAL LOW (ref 4.22–5.81)
RDW: 17.9 % — ABNORMAL HIGH (ref 11.5–15.5)
WBC: 5.5 10*3/uL (ref 4.0–10.5)
nRBC: 0.7 % — ABNORMAL HIGH (ref 0.0–0.2)

## 2022-10-15 LAB — IRON AND TIBC
Iron: 76 ug/dL (ref 45–182)
Saturation Ratios: 34 % (ref 17.9–39.5)
TIBC: 224 ug/dL — ABNORMAL LOW (ref 250–450)
UIBC: 148 ug/dL

## 2022-10-15 LAB — FERRITIN: Ferritin: 369 ng/mL — ABNORMAL HIGH (ref 24–336)

## 2022-10-15 NOTE — Progress Notes (Signed)
Hematology/Oncology Consult note [facility]  [phone] [phone]  Patient Care Team: Marjie Skiff, NP as PCP - General (Nurse Practitioner) Toney Reil, MD as Consulting Physician (Gastroenterology) Creig Hines, MD as Consulting Physician (Hematology and Oncology)   Name of the patient: George Gomez  [id number]  1960/09/04   Date of visit: 10/15/22  Diagnosis- normocytic anemia likely secondary to chronic disease and component of iron deficiency  Chief complaint/ Reason for visit-routine follow-up of anemia  Heme/Onc history:  Patient is a 62 year old African-American male who is transferring his care from Dr. Merlene Pulling to me.He has been followed for his pancytopenia but mainly anemia.  Hemoglobin has remained around 10 for the last 3 years.  He also has mild intermittent leukopenia with a white count that fluctuates between 3.3-4 mainly neutropenia.  Platelet counts have been mostly normal except for intermittent mild low platelet counts between 120s to 140s.  He has prior history of B12 deficiency and he gets monthly B12 injections.  History of folate deficiency but of late that has been normal.  He had a flow cytometry in December 2020 that was normal.  Anemia work-up including TSH copper zinc ANA hepatitis testing and HIV testing was negative.Patient has had an elevated ferritin which has been again a longstanding issue and his ferritin levels fluctuate between 986 304 2606.  He had HFE gene testing done in the past which was negative.LFTs have shown mildly elevated AST and ALT intermittently but no hyperbilirubinemia.MR eneterogram in October 2021 showed normal liver size and configuration.  No splenomegaly.    Interval history- No acute issues since last visit.  Denies any blood loss in his stool or urine.  ECOG PS- 1 Pain scale- 0   Review of systems- Review of Systems  Constitutional:  Negative for chills, fever,  malaise/fatigue and weight loss.  HENT:  Negative for congestion, ear discharge and nosebleeds.   Eyes:  Negative for blurred vision.  Respiratory:  Negative for cough, hemoptysis, sputum production, shortness of breath and wheezing.   Cardiovascular:  Negative for chest pain, palpitations, orthopnea and claudication.  Gastrointestinal:  Negative for abdominal pain, blood in stool, constipation, diarrhea, heartburn, melena, nausea and vomiting.  Genitourinary:  Negative for dysuria, flank pain, frequency, hematuria and urgency.  Musculoskeletal:  Negative for back pain, joint pain and myalgias.  Skin:  Negative for rash.  Neurological:  Negative for dizziness, tingling, focal weakness, seizures, weakness and headaches.  Endo/Heme/Allergies:  Does not bruise/bleed easily.  Psychiatric/Behavioral:  Negative for depression and suicidal ideas. The patient does not have insomnia.       No Known Allergies   Past Medical History:  Diagnosis Date   Acute on chronic renal failure (HCC) 03/20/2016   Acute renal failure (ARF) (HCC) 08/28/2016   Acute renal failure (HCC) 10/12/2018   Anemia    B12 deficiency 12/11/2017   Clostridium difficile diarrhea 03/18/2016   Diabetes mellitus without complication (HCC)    Diverticulitis    Pt states diverticulitis   EtOH dependence (HCC)    Folate deficiency 07/25/2018   GI bleed 05/30/2017   Hypercholesteremia    Hypertension    Melena 10/21/2018   Weight loss 09/15/2018     Past Surgical History:  Procedure Laterality Date   COLONOSCOPY WITH PROPOFOL N/A 10/16/2018   Procedure: COLONOSCOPY WITH PROPOFOL;  Surgeon: Toney Reil, MD;  Location: [facility] ENDOSCOPY;  Service: Gastroenterology;  Laterality: N/A;   ENTEROSCOPY N/A 10/16/2018   Procedure: ENTEROSCOPY;  Surgeon: Toney Reil, MD;  Location: [facility] ENDOSCOPY;  Service: Gastroenterology;  Laterality: N/A;   ESOPHAGOGASTRODUODENOSCOPY N/A 07/02/2018   Procedure: ESOPHAGOGASTRODUODENOSCOPY  (EGD);  Surgeon: Toney Reil, MD;  Location: [facility] ENDOSCOPY;  Service: Gastroenterology;  Laterality: N/A;   ESOPHAGOGASTRODUODENOSCOPY (EGD) WITH PROPOFOL N/A 06/01/2017   Procedure: ESOPHAGOGASTRODUODENOSCOPY (EGD) WITH PROPOFOL;  Surgeon: Wyline Mood, MD;  Location: [facility] ENDOSCOPY;  Service: Gastroenterology;  Laterality: N/A;   GIVENS CAPSULE STUDY N/A 10/17/2018   Procedure: GIVENS CAPSULE STUDY;  Surgeon: Toney Reil, MD;  Location: [facility] ENDOSCOPY;  Service: Gastroenterology;  Laterality: N/A;   GIVENS CAPSULE STUDY N/A 08/02/2019   Procedure: GIVENS CAPSULE STUDY;  Surgeon: Toney Reil, MD;  Location: [facility] ENDOSCOPY;  Service: Gastroenterology;  Laterality: N/A;   none      Social History   Socioeconomic History   Marital status: Divorced    Spouse name: Not on file   Number of children: Not on file   Years of education: Not on file   Highest education level: Not on file  Occupational History   Occupation: on disability  Tobacco Use   Smoking status: Never   Smokeless tobacco: Never  Vaping Use   Vaping status: Never Used  Substance and Sexual Activity   Alcohol use: Yes    Comment: on occasion   Drug use: No   Sexual activity: Yes  Other Topics Concern   Not on file  Social History Narrative   Not on file   Social Determinants of Health   Financial Resource Strain: Low Risk  (09/24/2021)   Overall Financial Resource Strain (CARDIA)    Difficulty of Paying Living Expenses: Not hard at all  Food Insecurity: No Food Insecurity (09/24/2021)   Hunger Vital Sign    Worried About Running Out of Food in the Last Year: Never true    Ran Out of Food in the Last Year: Never true  Transportation Needs: No Transportation Needs (09/24/2021)   PRAPARE - Administrator, Civil Service (Medical): No    Lack of Transportation (Non-Medical): No  Physical Activity: Insufficiently Active (09/24/2021)   Exercise Vital Sign    Days of Exercise per Week: 4  days    Minutes of Exercise per Session: 30 min  Stress: No Stress Concern Present (09/24/2021)   [employer] of Occupational Health - Occupational Stress Questionnaire    Feeling of Stress : Only a little  Social Connections: Socially Isolated (09/24/2021)   Social Connection and Isolation Panel [NHANES]    Frequency of Communication with Friends and Family: More than three times a week    Frequency of Social Gatherings with Friends and Family: More than three times a week    Attends Religious Services: Never    Database administrator or Organizations: No    Attends Banker Meetings: Never    Marital Status: Divorced  Catering manager Violence: Not At Risk (09/24/2021)   Humiliation, Afraid, Rape, and Kick questionnaire    Fear of Current or Ex-Partner: No    Emotionally Abused: No    Physically Abused: No    Sexually Abused: No    Family History  Problem Relation Age of Onset   Rheum arthritis Mother    Diabetes Father    Heart disease Father    Cancer Father    COPD Sister    Rheum arthritis Sister    Diabetes Maternal Grandmother    Cancer Maternal Grandfather    Diabetes Paternal Grandmother  Cancer Paternal Grandfather      Current Outpatient Medications:    allopurinol (ZYLOPRIM) 300 MG tablet, Take 1 tablet (300 mg total) by mouth daily., Disp: 90 tablet, Rfl: 4   atorvastatin (LIPITOR) 40 MG tablet, Take 1 tablet (40 mg total) by mouth daily., Disp: 90 tablet, Rfl: 4   Blood Glucose Monitoring Suppl (ONETOUCH VERIO) w/Device KIT, Use to check blood sugar 3 times a day and document results, bring to appointments.  Goal is <130 fasting blood sugar and <180 two hours after meals., Disp: 1 kit, Rfl: 0   gabapentin (NEURONTIN) 300 MG capsule, Take one tablet (300 MG) by mouth in the morning, then take one tablet (300 MG) by mouth at noon, and then take 600 MG (two tablets) by mouth before bedtime., Disp: 360 capsule, Rfl: 4   glucose blood (ONETOUCH  VERIO) test strip, Use to check blood sugar 3 times a day and document results, bring to appointments.  Goal is <130 fasting blood sugar and <180 two hours after meals., Disp: 100 each, Rfl: 12   Lancets (ONETOUCH ULTRASOFT) lancets, Use to check blood sugar 3 times a day and document results, bring to appointments.  Goal is <130 fasting blood sugar and <180 two hours after meals., Disp: 100 each, Rfl: 12   losartan (COZAAR) 100 MG tablet, TAKE 1 TABLET BY MOUTH DAILY, Disp: 90 tablet, Rfl: 0   magnesium oxide (MAG-OX) 400 (240 Mg) MG tablet, Take 1 tablet (400 mg total) by mouth 2 (two) times daily., Disp: 180 tablet, Rfl: 4   metFORMIN (GLUCOPHAGE-XR) 500 MG 24 hr tablet, Take 1 tablet (500 mg total) by mouth daily., Disp: 90 tablet, Rfl: 4   Multiple Vitamin (MULTIVITAMIN ADULT PO), Gnp Century Mature Multivitamin Tab 125c, Disp: , Rfl:    pantoprazole (PROTONIX) 40 MG tablet, Take 1 tablet (40 mg total) by mouth daily., Disp: 90 tablet, Rfl: 4   propranolol ER (INDERAL LA) 60 MG 24 hr capsule, Take 1 capsule (60 mg total) by mouth daily., Disp: 90 capsule, Rfl: 4   tiZANidine (ZANAFLEX) 2 MG tablet, TAKE 1 TABLET(2 MG) BY MOUTH EVERY 8 HOURS AS NEEDED FOR MUSCLE SPASMS, Disp: 30 tablet, Rfl: 0   triamcinolone cream (KENALOG) 0.1 %, Apply 1 application topically 2 (two) times daily., Disp: 453.6 g, Rfl: 0   VOLTAREN 1 % GEL, Apply 4 gram to affected area four times a day as needed for pain for knee pain. Do not exceed 16gm for LE joint per day, Disp: , Rfl:    amLODipine (NORVASC) 10 MG tablet, Take 1 tablet (10 mg total) by mouth daily. (Patient not taking: Reported on 10/15/2022), Disp: 90 tablet, Rfl: 4  Physical exam:  Vitals:   10/15/22 1115  BP: 133/79  Pulse: 85  Resp: 16  Temp: 98.9 F (37.2 C)  TempSrc: Tympanic  SpO2: 100%  Weight: 224 lb 3.2 oz (101.7 kg)   Physical Exam Cardiovascular:     Rate and Rhythm: Normal rate and regular rhythm.     Heart sounds: Normal heart  sounds.  Pulmonary:     Effort: Pulmonary effort is normal.     Breath sounds: Normal breath sounds.  Skin:    General: Skin is warm and dry.  Neurological:     Mental Status: He is alert and oriented to person, place, and time.         Latest Ref Rng & Units 07/24/2022   10:24 AM  CMP  Glucose 70 - 99  mg/dL 409   BUN 8 - 27 mg/dL 20   Creatinine 8.11 - 1.27 mg/dL 9.14   Sodium 782 - 956 mmol/L 144   Potassium 3.5 - 5.2 mmol/L 3.5   Chloride 96 - 106 mmol/L 106   CO2 20 - 29 mmol/L 21   Calcium 8.6 - 10.2 mg/dL 9.5   Total Protein 6.0 - 8.5 g/dL 7.1   Total Bilirubin 0.0 - 1.2 mg/dL <2.1   Alkaline Phos 44 - 121 IU/L 111   AST 0 - 40 IU/L 28   ALT 0 - 44 IU/L 18       Latest Ref Rng & Units 10/15/2022   10:58 AM  CBC  WBC 4.0 - 10.5 K/uL 5.5   Hemoglobin 13.0 - 17.0 g/dL 9.5   Hematocrit 30.8 - 52.0 % 29.6   Platelets 150 - 400 K/uL 212      Assessment and plan- Patient is a 62 y.o. male Here for routine follow-up of normocytic anemia   patient's baseline hemoglobin typically runs between 10-11.  Presently it is 9.5.  Platelet counts are normal.  Ferritin levels are 369 with an iron saturation of 34%.  He does not require any IV iron at this time.  I suspect his anemia is also secondary to chronic kidney disease.  Given that his hemoglobin is close to 10 he does not require any initiation of EPO at this time.  If his hemoglobin is consistently less than 9 we will consider starting EPO at that time.  CBC ferritin and iron studies in 3 and 6 months and I will see him back in 6 months   Visit Diagnosis 1. Iron deficiency anemia, unspecified iron deficiency anemia type   2. B12 deficiency      Dr. Owens Shark, MD, MPH [facility] at [facility] [phone] 10/15/2022 4:00 PM

## 2022-10-20 ENCOUNTER — Encounter: Payer: Self-pay | Admitting: Hematology and Oncology

## 2022-12-02 ENCOUNTER — Other Ambulatory Visit: Payer: 59

## 2022-12-02 ENCOUNTER — Ambulatory Visit: Payer: 59 | Admitting: Oncology

## 2022-12-09 ENCOUNTER — Other Ambulatory Visit: Payer: Self-pay

## 2022-12-09 ENCOUNTER — Emergency Department
Admission: EM | Admit: 2022-12-09 | Discharge: 2022-12-09 | Disposition: A | Discharge status: Home / Self Care | Payer: 59 | Attending: Emergency Medicine | Admitting: Emergency Medicine

## 2022-12-09 ENCOUNTER — Emergency Department: Payer: 59

## 2022-12-09 DIAGNOSIS — Z1152 Encounter for screening for COVID-19: Secondary | ICD-10-CM | POA: Diagnosis not present

## 2022-12-09 DIAGNOSIS — N183 Chronic kidney disease, stage 3 unspecified: Secondary | ICD-10-CM | POA: Insufficient documentation

## 2022-12-09 DIAGNOSIS — R112 Nausea with vomiting, unspecified: Secondary | ICD-10-CM | POA: Insufficient documentation

## 2022-12-09 DIAGNOSIS — R197 Diarrhea, unspecified: Secondary | ICD-10-CM | POA: Insufficient documentation

## 2022-12-09 DIAGNOSIS — R0789 Other chest pain: Secondary | ICD-10-CM | POA: Diagnosis not present

## 2022-12-09 DIAGNOSIS — R9389 Abnormal findings on diagnostic imaging of other specified body structures: Secondary | ICD-10-CM | POA: Diagnosis not present

## 2022-12-09 DIAGNOSIS — R109 Unspecified abdominal pain: Secondary | ICD-10-CM | POA: Diagnosis not present

## 2022-12-09 DIAGNOSIS — M791 Myalgia, unspecified site: Secondary | ICD-10-CM | POA: Diagnosis not present

## 2022-12-09 DIAGNOSIS — E119 Type 2 diabetes mellitus without complications: Secondary | ICD-10-CM | POA: Diagnosis not present

## 2022-12-09 DIAGNOSIS — R0602 Shortness of breath: Secondary | ICD-10-CM | POA: Insufficient documentation

## 2022-12-09 DIAGNOSIS — R6883 Chills (without fever): Secondary | ICD-10-CM | POA: Diagnosis not present

## 2022-12-09 DIAGNOSIS — R079 Chest pain, unspecified: Secondary | ICD-10-CM | POA: Diagnosis not present

## 2022-12-09 DIAGNOSIS — J984 Other disorders of lung: Secondary | ICD-10-CM | POA: Diagnosis not present

## 2022-12-09 DIAGNOSIS — I129 Hypertensive chronic kidney disease with stage 1 through stage 4 chronic kidney disease, or unspecified chronic kidney disease: Secondary | ICD-10-CM | POA: Insufficient documentation

## 2022-12-09 DIAGNOSIS — Z20822 Contact with and (suspected) exposure to covid-19: Secondary | ICD-10-CM | POA: Insufficient documentation

## 2022-12-09 DIAGNOSIS — N3289 Other specified disorders of bladder: Secondary | ICD-10-CM | POA: Diagnosis not present

## 2022-12-09 LAB — BASIC METABOLIC PANEL
Anion gap: 13 (ref 5–15)
BUN: 18 mg/dL (ref 8–23)
CO2: 22 mmol/L (ref 22–32)
Calcium: 8.7 mg/dL — ABNORMAL LOW (ref 8.9–10.3)
Chloride: 95 mmol/L — ABNORMAL LOW (ref 98–111)
Creatinine, Ser: 1.05 mg/dL (ref 0.61–1.24)
GFR, Estimated: >60 mL/min (ref >60)
Glucose, Bld: 132 mg/dL — ABNORMAL HIGH (ref 70–99)
Potassium: 3.3 mmol/L — ABNORMAL LOW (ref 3.5–5.1)
Sodium: 130 mmol/L — ABNORMAL LOW (ref 135–145)

## 2022-12-09 LAB — CBC
HCT: 24.8 % — ABNORMAL LOW (ref 39.0–52.0)
Hemoglobin: 8.2 g/dL — ABNORMAL LOW (ref 13.0–17.0)
MCH: 30.8 pg (ref 26.0–34.0)
MCHC: 33.1 g/dL (ref 30.0–36.0)
MCV: 93.2 fL (ref 80.0–100.0)
Platelets: 119 10*3/uL — ABNORMAL LOW (ref 150–400)
RBC: 2.66 MIL/uL — ABNORMAL LOW (ref 4.22–5.81)
RDW: 18.7 % — ABNORMAL HIGH (ref 11.5–15.5)
WBC: 3.1 10*3/uL — ABNORMAL LOW (ref 4.0–10.5)
nRBC: 2.6 % — ABNORMAL HIGH (ref 0.0–0.2)

## 2022-12-09 LAB — HEPATIC FUNCTION PANEL
ALT: 25 U/L (ref 0–44)
AST: 69 U/L — ABNORMAL HIGH (ref 15–41)
Albumin: 4 g/dL (ref 3.5–5.0)
Alkaline Phosphatase: 78 U/L (ref 38–126)
Bilirubin, Direct: 0.4 mg/dL — ABNORMAL HIGH (ref 0.0–0.2)
Indirect Bilirubin: 1 mg/dL — ABNORMAL HIGH (ref 0.3–0.9)
Total Bilirubin: 1.4 mg/dL — ABNORMAL HIGH (ref <1.2)
Total Protein: 7.2 g/dL (ref 6.5–8.1)

## 2022-12-09 LAB — URINALYSIS, ROUTINE W REFLEX MICROSCOPIC
Bilirubin Urine: NEGATIVE
Glucose, UA: NEGATIVE mg/dL
Hgb urine dipstick: NEGATIVE
Ketones, ur: 20 mg/dL — AB
Leukocytes,Ua: NEGATIVE
Nitrite: NEGATIVE
Protein, ur: NEGATIVE mg/dL
Specific Gravity, Urine: >1.046 — ABNORMAL HIGH (ref 1.005–1.030)
pH: 6 (ref 5.0–8.0)

## 2022-12-09 LAB — RESP PANEL BY RT-PCR (RSV, FLU A&B, COVID)  RVPGX2
Influenza A by PCR: NEGATIVE
Influenza B by PCR: NEGATIVE
Resp Syncytial Virus by PCR: NEGATIVE
SARS Coronavirus 2 by RT PCR: NEGATIVE

## 2022-12-09 LAB — TROPONIN I (HIGH SENSITIVITY)
Troponin I (High Sensitivity): 8 ng/L (ref <18)
Troponin I (High Sensitivity): 7 ng/L (ref <18)

## 2022-12-09 LAB — LIPASE, BLOOD: Lipase: 38 U/L (ref 11–51)

## 2022-12-09 MED ORDER — MORPHINE SULFATE (PF) 4 MG/ML IV SOLN
4.0000 mg | Freq: Once | INTRAVENOUS | Status: AC
  Administered 2022-12-09: 4 mg via INTRAVENOUS
  Filled 2022-12-09: qty 1

## 2022-12-09 MED ORDER — ONDANSETRON HCL 4 MG/2ML IJ SOLN
4.0000 mg | Freq: Once | INTRAMUSCULAR | Status: AC
  Administered 2022-12-09: 4 mg via INTRAVENOUS
  Filled 2022-12-09: qty 2

## 2022-12-09 MED ORDER — ONDANSETRON HCL 4 MG PO TABS: 4.0000 mg | ORAL_TABLET | Freq: Three times a day (TID) | ORAL | 0 refills | Status: AC | PRN

## 2022-12-09 MED ORDER — IOHEXOL 300 MG/ML  SOLN
100.0000 mL | Freq: Once | INTRAMUSCULAR | Status: AC | PRN
  Administered 2022-12-09: 100 mL via INTRAVENOUS

## 2022-12-09 MED ORDER — LACTATED RINGERS IV BOLUS
1000.0000 mL | Freq: Once | INTRAVENOUS | Status: AC
  Administered 2022-12-09: 1000 mL via INTRAVENOUS

## 2022-12-09 NOTE — ED Notes (Signed)
Patient passed po challenge.

## 2022-12-09 NOTE — ED Provider Notes (Signed)
[facility] Provider Note    Event Date/Time   First MD Initiated Contact with Patient 12/09/22 1459     (approximate)   History   Chest Pain   HPI Glean [place] is a 62 y.o. male with history of alcohol abuse, HTN, HLD, DM2, CKD stage III presenting today for nausea and vomiting.  Patient states for the past 3 days he has had nausea, vomiting, diarrhea, and abdominal pain.  He has felt some chills and bodyaches associated with this.  Because of all the vomiting he started having intermittent midline chest pain.  Otherwise denies shortness of breath, dysuria, hematuria, leg pain, leg swelling.  No confusion.  Reviewed recent chart notes.     Physical Exam   Triage Vital Signs: ED Triage Vitals  Encounter Vitals Group     BP 12/09/22 1246 (!) 137/92     Systolic BP Percentile --      Diastolic BP Percentile --      Pulse Rate 12/09/22 1246 86     Resp 12/09/22 1246 18     Temp 12/09/22 1246 98.3 F (36.8 C)     Temp src --      SpO2 12/09/22 1246 100 %     Weight 12/09/22 1252 198 lb (89.8 kg)     Height 12/09/22 1252 5\' 10"  (1.778 m)     Head Circumference --      Peak Flow --      Pain Score 12/09/22 1252 5     Pain Loc --      Pain Education --      Exclude from Growth Chart --     Most recent vital signs: Vitals:   12/09/22 1930 12/09/22 2000  BP: 137/78 127/78  Pulse: 72 82  Resp: 14 20  Temp:    SpO2: 100% 100%   Physical Exam: I have reviewed the vital signs and nursing notes. General: Awake, alert, no acute distress.  Nontoxic appearing. Head:  Atraumatic, normocephalic.   ENT:  EOM intact, PERRL. Oral mucosa is pink and moist with no lesions. Neck: Neck is supple with full range of motion, No meningeal signs. Cardiovascular:  RRR, No murmurs. Peripheral pulses palpable and equal bilaterally. Respiratory:  Symmetrical chest wall expansion.  No rhonchi, rales, or wheezes.  Good air movement throughout.  No use of accessory  muscles.   Musculoskeletal:  No cyanosis or edema. Moving extremities with full ROM Abdomen:  Soft, tenderness to palpation to epigastric, right upper quadrant, right lower quadrant, nondistended. Neuro:  GCS 15, moving all four extremities, interacting appropriately. Speech clear. Psych:  Calm, appropriate.   Skin:  Warm, dry, no rash.    ED Results / Procedures / Treatments   Labs (all labs ordered are listed, but only abnormal results are displayed) Labs Reviewed  BASIC METABOLIC PANEL - Abnormal; Notable for the following components:      Result Value   Sodium 130 (*)    Potassium 3.3 (*)    Chloride 95 (*)    Glucose, Bld 132 (*)    Calcium 8.7 (*)    All other components within normal limits  CBC - Abnormal; Notable for the following components:   WBC 3.1 (*)    RBC 2.66 (*)    Hemoglobin 8.2 (*)    HCT 24.8 (*)    RDW 18.7 (*)    Platelets 119 (*)    nRBC 2.6 (*)    All other components within normal  limits  HEPATIC FUNCTION PANEL - Abnormal; Notable for the following components:   AST 69 (*)    Total Bilirubin 1.4 (*)    Bilirubin, Direct 0.4 (*)    Indirect Bilirubin 1.0 (*)    All other components within normal limits  URINALYSIS, ROUTINE W REFLEX MICROSCOPIC - Abnormal; Notable for the following components:   Color, Urine YELLOW (*)    APPearance CLEAR (*)    Specific Gravity, Urine >1.046 (*)    Ketones, ur 20 (*)    All other components within normal limits  RESP PANEL BY RT-PCR (RSV, FLU A&B, COVID)  RVPGX2  LIPASE, BLOOD  TROPONIN I (HIGH SENSITIVITY)  TROPONIN I (HIGH SENSITIVITY)     EKG My EKG interpretation: Rate of 91, normal sinus rhythm.  Left axis deviation.  No acute ST elevations or depressions   RADIOLOGY Independently interpreted chest x-ray and CT abdomen/pelvis with no acute pathology   PROCEDURES:  Critical Care performed: No  Procedures   MEDICATIONS ORDERED IN ED: Medications  ondansetron (ZOFRAN) injection 4 mg (has no  administration in time range)  lactated ringers bolus 1,000 mL (0 mLs Intravenous Stopped 12/09/22 1750)  ondansetron (ZOFRAN) injection 4 mg (4 mg Intravenous Given 12/09/22 1548)  morphine (PF) 4 MG/ML injection 4 mg (4 mg Intravenous Given 12/09/22 1546)  iohexol (OMNIPAQUE) 300 MG/ML solution 100 mL (100 mLs Intravenous Contrast Given 12/09/22 1550)     IMPRESSION / MDM / ASSESSMENT AND PLAN / ED COURSE  I reviewed the triage vital signs and the nursing notes.                              Differential diagnosis includes, but is not limited to, viral GI, cholecystitis, appendicitis, SBO  Patient's presentation is most consistent with acute complicated illness / injury requiring diagnostic workup.  Patient is a 62 year old male presenting today for nausea, vomiting, and abdominal cramping.  Started having chest pain after multiple episodes of vomiting.  No chest pain at this time.  Vital signs stable on arrival.  Physical exam notable for tenderness palpation along the right side of the abdomen.  Patient was given fluids, morphine, and Zofran with symptom improvement.  EKG unremarkable and troponins negative x 2.  BMP notable for likely dehydration with hyponatremia and hypochloremia.  Lipase otherwise normal.  Respiratory panel normal.  Chest x-ray unremarkable.  CT abdomen/pelvis showed no acute intra-abdominal pathology.  UA with high specific gravity and ketones likely indicative of dehydration from his nausea with vomiting.  Most likely suspect this is a viral GI bug at this time.  Patient was able to tolerate p.o. here.  At this time, safe for discharge as he is tolerating p.o.  Will discharge home with nausea medication to take as needed.  Told to follow-up with his primary care provider for reassessment in a couple of days and given strict return precautions.  The patient is on the cardiac monitor to evaluate for evidence of arrhythmia and/or significant heart rate changes. Clinical  Course as of 12/09/22 2028  Tue Dec 09, 2022  1858 CT ABDOMEN PELVIS W CONTRAST No acute intra-abdominal pathology [DW]  1858 Patient feeling significantly better at this time with no ongoing symptoms.  Awaiting UA results and then likely plan for discharge [DW]  1925 Urinalysis, Routine w reflex microscopic -Urine, Clean Catch(!) No UTI [DW]  2027 Patient tolerating p.o. and feeling well.  Will give him 1 additional  dose of Zofran before he goes home to cover him for the evening and then pick up his medication in the morning. [DW]    Clinical Course User Index [DW] Janith Lima, MD     FINAL CLINICAL IMPRESSION(S) / ED DIAGNOSES   Final diagnoses:  Nausea vomiting and diarrhea     Rx / DC Orders   ED Discharge Orders          Ordered    ondansetron (ZOFRAN) 4 MG tablet  Every 8 hours PRN        12/09/22 2028             Note:  This document was prepared using Dragon voice recognition software and may include unintentional dictation errors.   Janith Lima, MD 12/09/22 2030

## 2022-12-09 NOTE — Discharge Instructions (Signed)
I have sent nausea medication to your pharmacy.  You can pick this up in the morning and take as needed.  Please follow-up with your primary care doctor to recheck labs in a couple of days.  Please return for any worsening symptoms.

## 2022-12-09 NOTE — ED Notes (Signed)
Pt to ED for CP since a few days, also lower abdominal pain and N/V. States has been unable to eat and feels weak. Pt also states is a daily drinker, "couple shots" daily, last drink yesterday.

## 2022-12-09 NOTE — ED Triage Notes (Addendum)
Pt comes with mid sternal cp for few days. Pt state some sob. Pt states pain is all in chest and radiates up and to arms and neck. Pt states he has been tired and just not feeling good. Pt states headache also.  Pt states vomiting. Pt states he has had significant weight loss also here recently and just feels weak.

## 2022-12-09 NOTE — ED Notes (Signed)
Pt to CT

## 2022-12-10 ENCOUNTER — Telehealth: Payer: Self-pay

## 2022-12-10 NOTE — Telephone Encounter (Signed)
Noted  

## 2022-12-10 NOTE — Transitions of Care (Post Inpatient/ED Visit) (Signed)
12/10/2022  Name: George Gomez MRN: [id number] DOB: 1961-01-10  Today's TOC FU Call Status: Today's TOC FU Call Status:: Successful TOC FU Call Completed TOC FU Call Complete Date: 12/10/22 Patient's Name and Date of Birth confirmed.  Transition Care Management Follow-up Telephone Call Date of Discharge: 12/09/22 Discharge Facility: [facility]) Type of Discharge: Emergency Department Reason for ED Visit: Other: (nausea/vomiting) How have you been since you were released from the hospital?: Same Any questions or concerns?: No  Items Reviewed: Did you receive and understand the discharge instructions provided?: No Medications obtained,verified, and reconciled?: Yes (Medications Reviewed) Any new allergies since your discharge?: No Dietary orders reviewed?: Yes Do you have support at home?: No  Medications Reviewed Today: Medications Reviewed Today     Reviewed by Karena Addison, LPN (Licensed Practical Nurse) on 12/10/22 at [id number]  Med List Status: <None>   Medication Order Taking? Sig Documenting Provider Last Dose Status Informant  allopurinol (ZYLOPRIM) 300 MG tablet [id number] No Take 1 tablet (300 mg total) by mouth daily. Aura Dials T, NP Taking Active   amLODipine (NORVASC) 10 MG tablet [id number] No Take 1 tablet (10 mg total) by mouth daily.  Patient not taking: Reported on 10/15/2022   Aura Dials T, NP Not Taking Active   atorvastatin (LIPITOR) 40 MG tablet [id number] No Take 1 tablet (40 mg total) by mouth daily. Aura Dials T, NP Taking Active   Blood Glucose Monitoring Suppl (ONETOUCH VERIO) w/Device KIT [id number] No Use to check blood sugar 3 times a day and document results, bring to appointments.  Goal is <130 fasting blood sugar and <180 two hours after meals. Aura Dials T, NP Taking Active   gabapentin (NEURONTIN) 300 MG capsule [id number] No Take one tablet (300 MG) by mouth in the morning, then take one tablet (300  MG) by mouth at noon, and then take 600 MG (two tablets) by mouth before bedtime. Aura Dials T, NP Taking Active   glucose blood (ONETOUCH VERIO) test strip [id number] No Use to check blood sugar 3 times a day and document results, bring to appointments.  Goal is <130 fasting blood sugar and <180 two hours after meals. Aura Dials T, NP Taking Active   Lancets [facility] ULTRASOFT) lancets [id number] No Use to check blood sugar 3 times a day and document results, bring to appointments.  Goal is <130 fasting blood sugar and <180 two hours after meals. Aura Dials T, NP Taking Active   losartan (COZAAR) 100 MG tablet [id number] No TAKE 1 TABLET BY MOUTH DAILY Cannady, Jolene T, NP Taking Active   magnesium oxide (MAG-OX) 400 (240 Mg) MG tablet [id number] No Take 1 tablet (400 mg total) by mouth 2 (two) times daily. Aura Dials T, NP Taking Active   metFORMIN (GLUCOPHAGE-XR) 500 MG 24 hr tablet [id number] No Take 1 tablet (500 mg total) by mouth daily. Aura Dials T, NP Taking Active   Multiple Vitamin (MULTIVITAMIN ADULT PO) [id number] No Gnp Century Mature Multivitamin Tab 125c [provider] Taking Active   ondansetron (ZOFRAN) 4 MG tablet [id number]  Take 1 tablet (4 mg total) by mouth every 8 (eight) hours as needed. Janith Lima, MD  Active   pantoprazole (PROTONIX) 40 MG tablet [id number] No Take 1 tablet (40 mg total) by mouth daily. Aura Dials T, NP Taking Active   propranolol ER (INDERAL LA) 60 MG 24 hr capsule [id number] No Take 1 capsule (60 mg total) by mouth daily. Marjie Skiff, NP Taking  Active   tiZANidine (ZANAFLEX) 2 MG tablet [id number] No TAKE 1 TABLET(2 MG) BY MOUTH EVERY 8 HOURS AS NEEDED FOR MUSCLE SPASMS Cannady, Jolene T, NP Taking Active   triamcinolone cream (KENALOG) 0.1 % [id number] No Apply 1 application topically 2 (two) times daily. McElwee, Lauren A, NP Taking Active   VOLTAREN 1 % GEL [id number] No Apply 4 gram to affected area four times a day  as needed for pain for knee pain. Do not exceed 16gm for LE joint per day [provider] Taking Active             Home Care and Equipment/Supplies: Were Home Health Services Ordered?: NA Any new equipment or medical supplies ordered?: NA  Functional Questionnaire: Do you need assistance with bathing/showering or dressing?: No Do you need assistance with meal preparation?: No Do you need assistance with eating?: No Do you have difficulty maintaining continence: No Do you need assistance with getting out of bed/getting out of a chair/moving?: No Do you have difficulty managing or taking your medications?: No  Follow up appointments reviewed: PCP Follow-up appointment confirmed?: No (no avail appts, sent message to staff) MD Provider Line [phone] Given: No Specialist Hospital Follow-up appointment confirmed?: NA Do you need transportation to your follow-up appointment?: No Do you understand care options if your condition(s) worsen?: Yes-patient verbalized understanding    SIGNATURE Karena Addison, LPN [facility] Nurse Health Advisor Direct Dial [phone]

## 2022-12-16 ENCOUNTER — Encounter: Payer: Self-pay | Admitting: Family Medicine

## 2022-12-16 ENCOUNTER — Telehealth: Payer: Self-pay

## 2022-12-16 ENCOUNTER — Ambulatory Visit: Payer: 59 | Admitting: Family Medicine

## 2022-12-16 VITALS — BP 132/72 | HR 81 | Temp 98.5°F | Resp 16 | Wt 202.4 lb

## 2022-12-16 DIAGNOSIS — K029 Dental caries, unspecified: Secondary | ICD-10-CM | POA: Diagnosis not present

## 2022-12-16 DIAGNOSIS — E1159 Type 2 diabetes mellitus with other circulatory complications: Secondary | ICD-10-CM

## 2022-12-16 DIAGNOSIS — R6884 Jaw pain: Secondary | ICD-10-CM | POA: Diagnosis not present

## 2022-12-16 DIAGNOSIS — I152 Hypertension secondary to endocrine disorders: Secondary | ICD-10-CM

## 2022-12-16 DIAGNOSIS — E1169 Type 2 diabetes mellitus with other specified complication: Secondary | ICD-10-CM | POA: Diagnosis not present

## 2022-12-16 DIAGNOSIS — F1010 Alcohol abuse, uncomplicated: Secondary | ICD-10-CM

## 2022-12-16 DIAGNOSIS — E785 Hyperlipidemia, unspecified: Secondary | ICD-10-CM | POA: Diagnosis not present

## 2022-12-16 DIAGNOSIS — I700 Atherosclerosis of aorta: Secondary | ICD-10-CM

## 2022-12-16 DIAGNOSIS — Z87898 Personal history of other specified conditions: Secondary | ICD-10-CM

## 2022-12-16 DIAGNOSIS — Z23 Encounter for immunization: Secondary | ICD-10-CM | POA: Diagnosis not present

## 2022-12-16 DIAGNOSIS — K760 Fatty (change of) liver, not elsewhere classified: Secondary | ICD-10-CM

## 2022-12-16 DIAGNOSIS — Z7984 Long term (current) use of oral hypoglycemic drugs: Secondary | ICD-10-CM

## 2022-12-16 NOTE — Assessment & Plan Note (Signed)
Will restart his atorvastatin and recheck with PCP at physical in December.

## 2022-12-16 NOTE — Patient Instructions (Addendum)
STOP LOSARTAN AND AMLODIPINE  RESTART PROPRANOLOL (blood pressure) AND ATORVASTATIN (cholesterol)  Pharmacist Elnita Maxwell) is going to call you  Brayton Caves is going to call about the dentist  Follow up with Jolene in December

## 2022-12-16 NOTE — Assessment & Plan Note (Signed)
Found incidentally on imaging. Continue to monitor.

## 2022-12-16 NOTE — Progress Notes (Signed)
BP 132/72 (BP Location: Left Arm, Patient Position: Sitting, Cuff Size: Normal)   Pulse 81   Temp 98.5 F (36.9 C) (Oral)   Resp 16   Wt 202 lb 6.4 oz (91.8 kg)   SpO2 97%   BMI 29.04 kg/m    Subjective:    Patient ID: George Gomez, male    DOB: 19-Apr-1960, 62 y.o.   MRN: [id number]  HPI: George Gomez is a 62 y.o. male  Chief Complaint  Patient presents with   Hospitalization Follow-up    Feels like pain is minimally better, throwing up a lot. Hiccups on and off for sometime. Nausea and vomiting are triggered after hiccups.    Jaw Pain    Started last week, had blood one morning on his mouth. Feels like the jaw is half locked. No fevers   Medication Problem    One of the BP meds caused facial swelling. States one of the hospitalist stopped one of them but he is not certain which one they said to stop so he isn't taking any of the ones listed as not taking.    ER FOLLOW UP Time since discharge: 7 days Hospital/facility: [facility] Diagnosis: Nausea/vomiting/diarrhea- thought to be viral GI bug Procedures/tests: CLINICAL DATA:  Chest pain and shortness of breath over the last 3 days.   EXAM: CHEST - 2 VIEW   COMPARISON:  05/29/2022   FINDINGS: Heart size remains normal. Left chest remains clear. Chronic elevation of the right hemidiaphragm with chronic volume loss at the right lung base, similar to the prior exam. Upper right lung appears clear and normal. No acute bone finding.   IMPRESSION: No active disease. Chronic elevation of the right hemidiaphragm with chronic volume loss at the right lung base.   CLINICAL DATA:  Epigastric, right upper quadrant, right lower quadrant abdominal pain x 3 days associated with vomiting and diarrhea concern for appendicitis versus nephrolithiasis versus cholecystitis versus enteritis   EXAM: CT ABDOMEN AND PELVIS WITH CONTRAST   TECHNIQUE: Multidetector CT imaging of the abdomen and pelvis was performed using the standard  protocol following bolus administration of intravenous contrast.   RADIATION DOSE REDUCTION: This exam was performed according to the departmental dose-optimization program which includes automated exposure control, adjustment of the mA and/or kV according to patient size and/or use of iterative reconstruction technique.   CONTRAST:  OMNIPAQUE IOHEXOL 300 MG/ML  SOLN   COMPARISON:  CT abdomen pelvis 08/21/2021   FINDINGS: Lower chest: No acute abnormality.  Atherosclerotic plaque.   Hepatobiliary: The hepatic parenchyma is diffusely hypodense compared to the splenic parenchyma consistent with fatty infiltration. No focal liver abnormality. No gallstones, gallbladder wall thickening, or pericholecystic fluid. No biliary dilatation.   Pancreas: No focal lesion. Normal pancreatic contour. No surrounding inflammatory changes. No main pancreatic ductal dilatation.   Spleen: Normal in size without focal abnormality.   Adrenals/Urinary Tract:   No adrenal nodule bilaterally.   Bilateral kidneys enhance symmetrically.   No hydronephrosis. No hydroureter.   The urinary bladder is decompressed with circumferential urinary bladder wall thickening.   No renal excretion of intravenous contrast noted on delayed imaging.   Stomach/Bowel: Stomach is within normal limits. No evidence of bowel wall thickening or dilatation. Colonic diverticulosis appendix appears normal.   Vascular/Lymphatic: No abdominal aorta or iliac aneurysm. Moderate atherosclerotic plaque of the aorta and its branches. No abdominal, pelvic, or inguinal lymphadenopathy.   Reproductive: Prostate is unremarkable.   Other: No intraperitoneal free fluid. No intraperitoneal free gas.  No organized fluid collection.   Musculoskeletal:   No abdominal wall hernia or abnormality.   No suspicious lytic or blastic osseous lesions. No acute displaced fracture. Multilevel degenerative changes of the spine.    IMPRESSION: 1. Hepatic steatosis. 2. Urinary bladder is decompressed with circumferential urinary bladder wall thickening. If clinically indicated consider urinalysis for evaluation of superimposed infection. 3. No renal excretion of intravenous contrast noted on delayed imaging. Recommend correlation with renal function. 4. Aortic Atherosclerosis (ICD10-I70.0). Consultants: none New medications: zofran  Discharge instructions: Follow up here  Status: better  [name] went to the ER in August for angioedema. He was taken off his losartan at that time. He did not have a follow up at that time and went back to the ER nausea and vomiting and diarrhea a week ago. He has entirely recovered from that and notes that he is here today for a follow up.  Was having some jaw pain about 2 weeks ago. It's still there. He feels like he can't totally open his jaw. He has not seen his dentist in an extended period.  HYPERTENSION- has not been taking any of his medicine in about 2 weeks  Hypertension status: controlled  Satisfied with current treatment? no Duration of hypertension: chronic BP monitoring frequency:   occasionally BP range: unknown- "good" BP medication side effects:  yes- angioedema on losartan and lisinopril Medication compliance: poor compliance Previous BP meds:lisinopril, losartan, propranolol, amlodipine Aspirin: no Recurrent headaches: no Visual changes: no Palpitations: no Dyspnea: no Chest pain: no Lower extremity edema: no Dizzy/lightheaded: no   Relevant past medical, surgical, family and social history reviewed and updated as indicated. Interim medical history since our last visit reviewed. Allergies and medications reviewed and updated.  Review of Systems  Constitutional: Negative.   HENT:  Positive for dental problem. Negative for congestion, drooling, ear discharge, ear pain, facial swelling, hearing loss, mouth sores, nosebleeds, postnasal drip, rhinorrhea,  sinus pressure, sinus pain, sneezing, sore throat, tinnitus, trouble swallowing and voice change.        + jaw pain  Respiratory: Negative.    Cardiovascular: Negative.   Gastrointestinal: Negative.   Musculoskeletal: Negative.   Psychiatric/Behavioral: Negative.      Per HPI unless specifically indicated above     Objective:    BP 132/72 (BP Location: Left Arm, Patient Position: Sitting, Cuff Size: Normal)   Pulse 81   Temp 98.5 F (36.9 C) (Oral)   Resp 16   Wt 202 lb 6.4 oz (91.8 kg)   SpO2 97%   BMI 29.04 kg/m   Wt Readings from Last 3 Encounters:  12/16/22 202 lb 6.4 oz (91.8 kg)  12/09/22 198 lb (89.8 kg)  10/15/22 224 lb 3.2 oz (101.7 kg)    Physical Exam Vitals and nursing note reviewed.  Constitutional:      General: He is not in acute distress.    Appearance: Normal appearance. He is not ill-appearing, toxic-appearing or diaphoretic.  HENT:     Head: Normocephalic and atraumatic.     Right Ear: External ear normal.     Left Ear: External ear normal.     Nose: Nose normal.     Mouth/Throat:     Mouth: Mucous membranes are moist.     Pharynx: Oropharynx is clear.     Comments: Poor dentition and dental caries Eyes:     General: No scleral icterus.       Right eye: No discharge.        Left  eye: No discharge.     Extraocular Movements: Extraocular movements intact.     Conjunctiva/sclera: Conjunctivae normal.     Pupils: Pupils are equal, round, and reactive to light.  Cardiovascular:     Rate and Rhythm: Normal rate and regular rhythm.     Pulses: Normal pulses.     Heart sounds: Normal heart sounds. No murmur heard.    No friction rub. No gallop.  Pulmonary:     Effort: Pulmonary effort is normal. No respiratory distress.     Breath sounds: Normal breath sounds. No stridor. No wheezing, rhonchi or rales.  Chest:     Chest wall: No tenderness.  Musculoskeletal:        General: Normal range of motion.     Cervical back: Normal range of motion and  neck supple.  Lymphadenopathy:     Cervical: Cervical adenopathy (submandibular) present.  Skin:    General: Skin is warm and dry.     Capillary Refill: Capillary refill takes less than 2 seconds.     Coloration: Skin is not jaundiced or pale.     Findings: No bruising, erythema, lesion or rash.  Neurological:     General: No focal deficit present.     Mental Status: He is alert and oriented to person, place, and time. Mental status is at baseline.  Psychiatric:        Mood and Affect: Mood normal.        Behavior: Behavior normal.        Thought Content: Thought content normal.        Judgment: Judgment normal.     Results for orders placed or performed during the hospital encounter of 12/09/22  Resp panel by RT-PCR (RSV, Flu A&B, Covid) Anterior Nasal Swab   Specimen: Anterior Nasal Swab  Result Value Ref Range   SARS Coronavirus 2 by RT PCR NEGATIVE NEGATIVE   Influenza A by PCR NEGATIVE NEGATIVE   Influenza B by PCR NEGATIVE NEGATIVE   Resp Syncytial Virus by PCR NEGATIVE NEGATIVE  Basic metabolic panel  Result Value Ref Range   Sodium 130 (L) 135 - 145 mmol/L   Potassium 3.3 (L) 3.5 - 5.1 mmol/L   Chloride 95 (L) 98 - 111 mmol/L   CO2 22 22 - 32 mmol/L   Glucose, Bld 132 (H) 70 - 99 mg/dL   BUN 18 8 - 23 mg/dL   Creatinine, Ser 1.61 0.61 - 1.24 mg/dL   Calcium 8.7 (L) 8.9 - 10.3 mg/dL   GFR, Estimated >09 >60 mL/min   Anion gap 13 5 - 15  CBC  Result Value Ref Range   WBC 3.1 (L) 4.0 - 10.5 K/uL   RBC 2.66 (L) 4.22 - 5.81 MIL/uL   Hemoglobin 8.2 (L) 13.0 - 17.0 g/dL   HCT 45.4 (L) 09.8 - 11.9 %   MCV 93.2 80.0 - 100.0 fL   MCH 30.8 26.0 - 34.0 pg   MCHC 33.1 30.0 - 36.0 g/dL   RDW 14.7 (H) 82.9 - 56.2 %   Platelets 119 (L) 150 - 400 K/uL   nRBC 2.6 (H) 0.0 - 0.2 %  Hepatic function panel  Result Value Ref Range   Total Protein 7.2 6.5 - 8.1 g/dL   Albumin 4.0 3.5 - 5.0 g/dL   AST 69 (H) 15 - 41 U/L   ALT 25 0 - 44 U/L   Alkaline Phosphatase 78 38 - 126  U/L   Total Bilirubin 1.4 (H) <1.2 mg/dL  Bilirubin, Direct 0.4 (H) 0.0 - 0.2 mg/dL   Indirect Bilirubin 1.0 (H) 0.3 - 0.9 mg/dL  Lipase, blood  Result Value Ref Range   Lipase 38 11 - 51 U/L  Urinalysis, Routine w reflex microscopic -Urine, Clean Catch  Result Value Ref Range   Color, Urine YELLOW (A) YELLOW   APPearance CLEAR (A) CLEAR   Specific Gravity, Urine >1.046 (H) 1.005 - 1.030   pH 6.0 5.0 - 8.0   Glucose, UA NEGATIVE NEGATIVE mg/dL   Hgb urine dipstick NEGATIVE NEGATIVE   Bilirubin Urine NEGATIVE NEGATIVE   Ketones, ur 20 (A) NEGATIVE mg/dL   Protein, ur NEGATIVE NEGATIVE mg/dL   Nitrite NEGATIVE NEGATIVE   Leukocytes,Ua NEGATIVE NEGATIVE  Troponin I (High Sensitivity)  Result Value Ref Range   Troponin I (High Sensitivity) 8 <18 ng/L  Troponin I (High Sensitivity)  Result Value Ref Range   Troponin I (High Sensitivity) 7 <18 ng/L      Assessment & Plan:   Problem List Items Addressed This Visit       Cardiovascular and Mediastinum   Hypertension associated with type 2 diabetes mellitus (HCC) - Primary    BP doing OK not on any medicine right now. Has been having palpitations. Will restart his propranolol and stop losartan and amlodipine. Referral to pharmacy placed- may need to physically remove prior pills from patient and he has taken them again without realizing. Allergy list updated- have not called pharmacy yet to make them aware of severe allergy. Follow up 1 month with PCP.       Relevant Orders   AMB Referral [facility] Care Management   Aortic atherosclerosis (HCC)    Will restart his atorvastatin and recheck with PCP at physical in December.       Relevant Orders   AMB Referral [facility] Care Management     Digestive   Hepatic steatosis    Found incidentally on imaging. Continue to monitor.       Relevant Orders   AMB Referral [facility] Care Management     Endocrine   Hyperlipidemia due to type 2 diabetes mellitus (HCC)    Will restart his atorvastatin  and recheck with PCP at physical in December.         Other   Alcohol abuse   Relevant Orders   AMB Referral [facility] Care Management   History of angioedema    Will get pharmacy involved. Do not take losartan or lisinopril. Call with any concerns.      Relevant Orders   AMB Referral [facility] Care Management   Other Visit Diagnoses     Jaw pain       Referral to dentistry placed today. Await their input.   Relevant Orders   Ambulatory referral to Dentistry   Dental caries       Referral to dentistry placed today. Await their input.   Relevant Orders   Ambulatory referral to Dentistry   Need for vaccination       Flu and covid given today.   Relevant Orders   Pfizer Comirnaty Covid -19 Vaccine 37yrs and older (Completed)   Flu vaccine trivalent PF, 6mos and older(Flulaval,Afluria,Fluarix,Fluzone) (Completed)        Follow up plan: Return As scheduled with Jolene- please get him on eye exam schedule for December as well.Marland Kitchen

## 2022-12-16 NOTE — Assessment & Plan Note (Signed)
BP doing OK not on any medicine right now. Has been having palpitations. Will restart his propranolol and stop losartan and amlodipine. Referral to pharmacy placed- may need to physically remove prior pills from patient and he has taken them again without realizing. Allergy list updated- have not called pharmacy yet to make them aware of severe allergy. Follow up 1 month with PCP.

## 2022-12-16 NOTE — Progress Notes (Signed)
   12/16/2022  Patient ID: George Gomez, male   DOB: 09-11-1960, 62 y.o.   MRN: [id number]  Contacted patient to schedule in person medication review in response to clinic routed request from Dr. Laural Benes.  Patient is coming into [facility] tomorrow morning at 830am and will bring all home medications with him.  Lenna Gilford, PharmD, DPLA

## 2022-12-16 NOTE — Assessment & Plan Note (Signed)
Will get pharmacy involved. Do not take losartan or lisinopril. Call with any concerns.

## 2022-12-17 ENCOUNTER — Other Ambulatory Visit: Payer: Self-pay

## 2022-12-17 NOTE — Progress Notes (Signed)
12/17/2022 Name: George Gomez MRN: [id number] DOB: 09/14/1960  Chief Complaint  Patient presents with   Medication Management   George Gomez is Gomez 62 y.o. year old male who was referred for medication management by their primary care provider, George Skiff, Gomez. They presented for Gomez face to face visit today.   They were referred to the pharmacist by  George Gomez  for assistance in managing complex medication management   Subjective:  Care Team: Primary Care Provider: Marjie Skiff, Gomez ; Next Scheduled Visit: 12/27  Medication Access/Adherence  Current Pharmacy:  [facility] [place], [place] - [address] [place] [address] Phone: [phone] Fax: [phone]  [facility] #[id number] [place], [place] - [address] AT [facility] [address] [place] [address] Phone: [phone] Fax: [phone]  [facility] #[id number] [place], [place] - [address] AT [facility] [address] [facility] [address] [place] [address] Phone: [phone] Fax: [phone]  -Patient reports affordability concerns with their medications: No  -Patient reports access/transportation concerns to their pharmacy: No  -Patient reports adherence concerns with their medications:  Yes    Patient presented with all medication bottles from home for complete review.  Medication list accurately reflects what patient has and is taking currently.  Patient states previous pharmacy would include on bottle label what medication was for, and this was helpful to him.  Diabetes: Current medications: metformin XR 500mg  daily -patient endorses good adherence to medication regimen, and pharmacy fill history reflects this -does not check home BG  Hypertension: Current medications: propranolol ER 60mg  daily -Medications previously tried: losartan and lisinopril caused angioedema -Patient has Gomez validated, automated, upper arm home BP cuff but unable to  provide home readings -Last OV BP 132/72 -Patient presented with bottle of losartan 100mg  in with his other medications, but he states he has not taken this since hospital visit in August for angioedema -Taking propranolol for heart palpitations in addition to BP control  Hyperlipidemia/ASCVD Risk Reduction Current lipid lowering medications: atorvastatin 40mg  daily -Patient endorses good adherence to medication regimen, but pharmacy fill history does not reflect this  Objective: Lab Results  Component Value Date   HGBA1C 6.1 (H) 07/24/2022   Lab Results  Component Value Date   CREATININE 1.05 12/09/2022   BUN 18 12/09/2022   NA 130 (L) 12/09/2022   K 3.3 (L) 12/09/2022   CL 95 (L) 12/09/2022   CO2 22 12/09/2022   Lab Results  Component Value Date   CHOL 189 07/24/2022   HDL 64 07/24/2022   LDLCALC 85 07/24/2022   TRIG 241 (H) 07/24/2022   CHOLHDL 2.3 07/09/2021   Medications Reviewed Today     Reviewed by George Gomez, RPH (Pharmacist) on 12/17/22 at 0854  Med List Status: <None>   Medication Order Taking? Sig Documenting Provider Last Dose Status Informant  allopurinol (ZYLOPRIM) 300 MG tablet [id number] Yes Take 1 tablet (300 mg total) by mouth daily. George Gomez Taking Active   atorvastatin (LIPITOR) 40 MG tablet [id number] Yes Take 1 tablet (40 mg total) by mouth daily. George Gomez Taking Active   Blood Glucose Monitoring Suppl (ONETOUCH VERIO) w/Device KIT [id number] No Use to check blood sugar 3 times Gomez day and document results, bring to appointments.  Goal is <130 fasting blood sugar and <180 two hours after meals.  Patient not taking:  Reported on 12/17/2022   George Gomez Not Taking Active   Cholecalciferol 50 MCG (2000 UT) CAPS [id number] Yes Take 1 capsule by mouth daily. [provider] Taking Active   cyanocobalamin (VITAMIN B12) 1000 MCG tablet [id number] Yes Take 1,000 mcg by mouth daily. [provider] Taking  Active   gabapentin (NEURONTIN) 300 MG capsule [id number] Yes Take one tablet (300 MG) by mouth in the morning, then take one tablet (300 MG) by mouth at noon, and then take 600 MG (two tablets) by mouth before bedtime. George Gomez Taking Active            Med Note George Gomez, George Gomez   Wed Dec 17, 2022  8:38 AM) Taking as needed  glucose blood [facility] VERIO) test strip [id number] No Use to check blood sugar 3 times Gomez day and document results, bring to appointments.  Goal is <130 fasting blood sugar and <180 two hours after meals.  Patient not taking: Reported on 12/17/2022   George Gomez Not Taking Active   Lancets [facility] ULTRASOFT) lancets [id number] No Use to check blood sugar 3 times Gomez day and document results, bring to appointments.  Goal is <130 fasting blood sugar and <180 two hours after meals.  Patient not taking: Reported on 12/17/2022   George Gomez Not Taking Active   magnesium oxide (MAG-OX) 400 (240 Mg) MG tablet [id number] Yes Take 1 tablet (400 mg total) by mouth 2 (two) times daily. George Gomez Taking Active   metFORMIN (GLUCOPHAGE-XR) 500 MG 24 hr tablet [id number] Yes Take 1 tablet (500 mg total) by mouth daily. George Gomez Taking Active   Multiple Vitamin (MULTIVITAMIN ADULT PO) [id number] No Gnp Century Mature Multivitamin Tab 125c  Patient not taking: Reported on 12/16/2022   [provider] Not Taking Active   ondansetron (ZOFRAN) 4 MG tablet [id number] Yes Take 1 tablet (4 mg total) by mouth every 8 (eight) hours as needed. George Lima, MD Taking Active   pantoprazole (PROTONIX) 40 MG tablet [id number] Yes Take 1 tablet (40 mg total) by mouth daily. George Gomez Taking Active   propranolol ER (INDERAL LA) 60 MG 24 hr capsule [id number] Yes Take 1 capsule (60 mg total) by mouth daily. George Gomez Taking Active   tiZANidine (ZANAFLEX) 2 MG tablet [id number] Yes TAKE 1 TABLET(2 MG) BY MOUTH EVERY 8 HOURS AS  NEEDED FOR MUSCLE SPASMS Cannady, Jolene T, Gomez Taking Active   triamcinolone cream (KENALOG) 0.1 % [id number] Yes Apply 1 application topically 2 (two) times daily. McElwee, Lauren Gomez, Gomez Taking Active   VOLTAREN 1 % GEL [id number] Yes Apply 4 gram to affected area four times Gomez day as needed for pain for knee pain. Do not exceed 16gm for LE joint per day [provider] Taking Active            Assessment/Plan:   Medication Access/Adherence -Added indication to current medication sig, so when provider renews it should prompt to keep previous sig or "way patient is taking."  If way patient is taking is selecting, these will reorder with indication in the directions -Discussed adherence packaging and home delivery, but patient does not believe that is needed at this time  Diabetes: - Currently controlled - Continue current regimen  Hypertension: - Currently moderately controlled - Patient has removed losartan from his other medications to prevent any confusion or accidental taking of medication, and I am  contacting [facility] to update his allergy list with them - Continue propranolol ER 60mg  daily and regular monitoring of BP  Hyperlipidemia/ASCVD Risk Reduction: - Currently uncontrolled with LDL>70 - Reiterated daily use of atorvastatin for cholesterol control - Recommend f/u lipid panel in 3 months; if LDL>70, I recommend increasing atorvastatin to 80mg  daily  Follow Up Plan: patient sees PCP 12/27, and I will follow-up as recommended  George Gomez, PharmD, DPLA

## 2022-12-30 DIAGNOSIS — D126 Benign neoplasm of colon, unspecified: Secondary | ICD-10-CM | POA: Diagnosis not present

## 2022-12-30 DIAGNOSIS — R634 Abnormal weight loss: Secondary | ICD-10-CM | POA: Diagnosis not present

## 2022-12-30 DIAGNOSIS — K219 Gastro-esophageal reflux disease without esophagitis: Secondary | ICD-10-CM | POA: Diagnosis not present

## 2022-12-30 DIAGNOSIS — K7581 Nonalcoholic steatohepatitis (NASH): Secondary | ICD-10-CM | POA: Diagnosis not present

## 2022-12-30 DIAGNOSIS — Z800 Family history of malignant neoplasm of digestive organs: Secondary | ICD-10-CM | POA: Diagnosis not present

## 2022-12-30 DIAGNOSIS — R1319 Other dysphagia: Secondary | ICD-10-CM | POA: Diagnosis not present

## 2022-12-30 DIAGNOSIS — E1122 Type 2 diabetes mellitus with diabetic chronic kidney disease: Secondary | ICD-10-CM | POA: Diagnosis not present

## 2022-12-30 DIAGNOSIS — E1159 Type 2 diabetes mellitus with other circulatory complications: Secondary | ICD-10-CM | POA: Diagnosis not present

## 2022-12-30 DIAGNOSIS — Z862 Personal history of diseases of the blood and blood-forming organs and certain disorders involving the immune mechanism: Secondary | ICD-10-CM | POA: Diagnosis not present

## 2022-12-30 DIAGNOSIS — R7401 Elevation of levels of liver transaminase levels: Secondary | ICD-10-CM | POA: Diagnosis not present

## 2023-01-08 ENCOUNTER — Other Ambulatory Visit: Payer: Self-pay | Admitting: Nurse Practitioner

## 2023-01-08 DIAGNOSIS — I152 Hypertension secondary to endocrine disorders: Secondary | ICD-10-CM

## 2023-01-08 NOTE — Telephone Encounter (Signed)
Requested Prescriptions  Pending Prescriptions Disp Refills   propranolol ER (INDERAL LA) 60 MG 24 hr capsule [Pharmacy Med Name: PROPRANOLOL ER 60MG  CAPSULES] 90 capsule 4    Sig: TAKE 1 CAPSULE(60 MG) BY MOUTH DAILY     Cardiovascular:  Beta Blockers Passed - 01/08/2023 12:08 PM      Passed - Last BP in normal range    BP Readings from Last 1 Encounters:  12/16/22 132/72         Passed - Last Heart Rate in normal range    Pulse Readings from Last 1 Encounters:  12/16/22 81         Passed - Valid encounter within last 6 months    Recent Outpatient Visits           3 weeks ago Hypertension associated with type 2 diabetes mellitus (HCC)   [facility] [place], Megan P, DO   5 months ago Type 2 diabetes mellitus with proteinuria (HCC)   [facility] [place], [place] T, NP   8 months ago Type 2 diabetes mellitus with proteinuria (HCC)   [facility] [place], Corrie Dandy T, NP   1 year ago Type 2 diabetes mellitus with proteinuria (HCC)   [facility] [place], Corrie Dandy T, NP   1 year ago Neuropathy   [facility] Vigg, Avanti, MD       Future Appointments             In 2 weeks Cannady, Dorie Rank, NP [place] [facility], [facility]

## 2023-01-14 ENCOUNTER — Other Ambulatory Visit: Payer: Self-pay | Admitting: *Deleted

## 2023-01-14 ENCOUNTER — Ambulatory Visit: Payer: 59

## 2023-01-14 ENCOUNTER — Other Ambulatory Visit: Payer: Self-pay

## 2023-01-14 ENCOUNTER — Inpatient Hospital Stay: Payer: 59 | Attending: Oncology

## 2023-01-14 DIAGNOSIS — E538 Deficiency of other specified B group vitamins: Secondary | ICD-10-CM | POA: Insufficient documentation

## 2023-01-14 DIAGNOSIS — N189 Chronic kidney disease, unspecified: Secondary | ICD-10-CM | POA: Insufficient documentation

## 2023-01-14 DIAGNOSIS — D61818 Other pancytopenia: Secondary | ICD-10-CM | POA: Diagnosis not present

## 2023-01-14 DIAGNOSIS — N183 Chronic kidney disease, stage 3 unspecified: Secondary | ICD-10-CM

## 2023-01-14 DIAGNOSIS — E1140 Type 2 diabetes mellitus with diabetic neuropathy, unspecified: Secondary | ICD-10-CM

## 2023-01-14 DIAGNOSIS — R7401 Elevation of levels of liver transaminase levels: Secondary | ICD-10-CM | POA: Diagnosis not present

## 2023-01-14 DIAGNOSIS — D509 Iron deficiency anemia, unspecified: Secondary | ICD-10-CM | POA: Insufficient documentation

## 2023-01-14 DIAGNOSIS — E1129 Type 2 diabetes mellitus with other diabetic kidney complication: Secondary | ICD-10-CM

## 2023-01-14 LAB — IRON AND TIBC
Iron: 125 ug/dL (ref 45–182)
Saturation Ratios: 40 % — ABNORMAL HIGH (ref 17.9–39.5)
TIBC: 314 ug/dL (ref 250–450)
UIBC: 189 ug/dL

## 2023-01-14 LAB — CBC WITH DIFFERENTIAL/PLATELET
Abs Immature Granulocytes: 0.02 10*3/uL (ref 0.00–0.07)
Basophils Absolute: 0 10*3/uL (ref 0.0–0.1)
Basophils Relative: 2 %
Eosinophils Absolute: 0 10*3/uL (ref 0.0–0.5)
Eosinophils Relative: 1 %
HCT: 32.5 % — ABNORMAL LOW (ref 39.0–52.0)
Hemoglobin: 10.3 g/dL — ABNORMAL LOW (ref 13.0–17.0)
Immature Granulocytes: 1 %
Lymphocytes Relative: 26 %
Lymphs Abs: 0.6 10*3/uL — ABNORMAL LOW (ref 0.7–4.0)
MCH: 31.9 pg (ref 26.0–34.0)
MCHC: 31.7 g/dL (ref 30.0–36.0)
MCV: 100.6 fL — ABNORMAL HIGH (ref 80.0–100.0)
Monocytes Absolute: 0.2 10*3/uL (ref 0.1–1.0)
Monocytes Relative: 8 %
Neutro Abs: 1.6 10*3/uL — ABNORMAL LOW (ref 1.7–7.7)
Neutrophils Relative %: 62 %
Platelets: 173 10*3/uL (ref 150–400)
RBC: 3.23 MIL/uL — ABNORMAL LOW (ref 4.22–5.81)
RDW: 17.2 % — ABNORMAL HIGH (ref 11.5–15.5)
WBC: 2.5 10*3/uL — ABNORMAL LOW (ref 4.0–10.5)
nRBC: 0 % (ref 0.0–0.2)

## 2023-01-14 LAB — COMPREHENSIVE METABOLIC PANEL
ALT: 34 U/L (ref 0–44)
AST: 80 U/L — ABNORMAL HIGH (ref 15–41)
Albumin: 4.2 g/dL (ref 3.5–5.0)
Alkaline Phosphatase: 86 U/L (ref 38–126)
Anion gap: 13 (ref 5–15)
BUN: 23 mg/dL (ref 8–23)
CO2: 25 mmol/L (ref 22–32)
Calcium: 9.1 mg/dL (ref 8.9–10.3)
Chloride: 97 mmol/L — ABNORMAL LOW (ref 98–111)
Creatinine, Ser: 1.3 mg/dL — ABNORMAL HIGH (ref 0.61–1.24)
GFR, Estimated: >60 mL/min (ref >60)
Glucose, Bld: 106 mg/dL — ABNORMAL HIGH (ref 70–99)
Potassium: 3.9 mmol/L (ref 3.5–5.1)
Sodium: 135 mmol/L (ref 135–145)
Total Bilirubin: 1 mg/dL (ref <1.2)
Total Protein: 8 g/dL (ref 6.5–8.1)

## 2023-01-14 LAB — FERRITIN: Ferritin: 322 ng/mL (ref 24–336)

## 2023-01-14 LAB — VITAMIN B12: Vitamin B-12: 374 pg/mL (ref 180–914)

## 2023-01-15 ENCOUNTER — Ambulatory Visit: Payer: 59

## 2023-01-20 NOTE — Patient Instructions (Signed)

## 2023-01-23 ENCOUNTER — Ambulatory Visit (INDEPENDENT_AMBULATORY_CARE_PROVIDER_SITE_OTHER): Payer: 59 | Admitting: Nurse Practitioner

## 2023-01-23 ENCOUNTER — Encounter: Payer: Self-pay | Admitting: Nurse Practitioner

## 2023-01-23 VITALS — BP 132/70 | HR 80 | Temp 98.3°F | Ht 71.0 in | Wt 201.8 lb

## 2023-01-23 DIAGNOSIS — E1140 Type 2 diabetes mellitus with diabetic neuropathy, unspecified: Secondary | ICD-10-CM | POA: Diagnosis not present

## 2023-01-23 DIAGNOSIS — E1159 Type 2 diabetes mellitus with other circulatory complications: Secondary | ICD-10-CM

## 2023-01-23 DIAGNOSIS — E538 Deficiency of other specified B group vitamins: Secondary | ICD-10-CM | POA: Diagnosis not present

## 2023-01-23 DIAGNOSIS — D696 Thrombocytopenia, unspecified: Secondary | ICD-10-CM | POA: Diagnosis not present

## 2023-01-23 DIAGNOSIS — E1169 Type 2 diabetes mellitus with other specified complication: Secondary | ICD-10-CM | POA: Diagnosis not present

## 2023-01-23 DIAGNOSIS — D649 Anemia, unspecified: Secondary | ICD-10-CM | POA: Diagnosis not present

## 2023-01-23 DIAGNOSIS — N400 Enlarged prostate without lower urinary tract symptoms: Secondary | ICD-10-CM

## 2023-01-23 DIAGNOSIS — E1122 Type 2 diabetes mellitus with diabetic chronic kidney disease: Secondary | ICD-10-CM

## 2023-01-23 DIAGNOSIS — N183 Chronic kidney disease, stage 3 unspecified: Secondary | ICD-10-CM | POA: Diagnosis not present

## 2023-01-23 DIAGNOSIS — M1A0610 Idiopathic chronic gout, right knee, without tophus (tophi): Secondary | ICD-10-CM

## 2023-01-23 DIAGNOSIS — R7989 Other specified abnormal findings of blood chemistry: Secondary | ICD-10-CM

## 2023-01-23 DIAGNOSIS — E669 Obesity, unspecified: Secondary | ICD-10-CM

## 2023-01-23 DIAGNOSIS — F1010 Alcohol abuse, uncomplicated: Secondary | ICD-10-CM

## 2023-01-23 DIAGNOSIS — E785 Hyperlipidemia, unspecified: Secondary | ICD-10-CM | POA: Diagnosis not present

## 2023-01-23 LAB — BAYER DCA HB A1C WAIVED: HB A1C (BAYER DCA - WAIVED): 5.1 % (ref 4.8–5.6)

## 2023-01-23 MED ORDER — ATORVASTATIN CALCIUM 40 MG PO TABS
40.0000 mg | ORAL_TABLET | Freq: Every day | ORAL | 4 refills | Status: DC
  Filled 2023-03-20: qty 90, 90d supply, fill #0
  Filled 2023-04-01: qty 30, 30d supply, fill #0
  Filled 2023-05-04 – 2023-05-05 (×3): qty 30, 30d supply, fill #1

## 2023-01-23 MED ORDER — ALLOPURINOL 300 MG PO TABS
300.0000 mg | ORAL_TABLET | Freq: Every day | ORAL | 4 refills | Status: DC
  Filled 2023-03-20: qty 90, 90d supply, fill #0
  Filled 2023-04-01: qty 30, 30d supply, fill #0
  Filled 2023-05-04 – 2023-05-05 (×3): qty 30, 30d supply, fill #1

## 2023-01-23 MED ORDER — VITAMIN B-12 1000 MCG PO TABS
1000.0000 ug | ORAL_TABLET | Freq: Every day | ORAL | 4 refills | Status: DC
  Filled 2023-03-20: qty 90, 90d supply, fill #0
  Filled 2023-04-01: qty 30, 30d supply, fill #0
  Filled 2023-05-04 – 2023-05-05 (×3): qty 30, 30d supply, fill #1

## 2023-01-23 MED ORDER — PROPRANOLOL HCL ER 60 MG PO CP24
60.0000 mg | ORAL_CAPSULE | Freq: Every day | ORAL | 4 refills | Status: DC
  Filled 2023-03-20: qty 90, 90d supply, fill #0
  Filled 2023-04-01: qty 30, 30d supply, fill #0
  Filled 2023-05-04 – 2023-05-05 (×3): qty 30, 30d supply, fill #1

## 2023-01-23 NOTE — Assessment & Plan Note (Signed)
Recheck on labs today and consider imaging in future if elevations trend up.  Recommend cut back on alcohol use.

## 2023-01-23 NOTE — Assessment & Plan Note (Signed)
Chronic, ongoing with A1c 5.1% today.  At this time recommend he stop Metformin, he has had some weight loss recently.  Urine ALB 80 (March 2024), can not take ACE or ARB.  Continue Gabapentin for neuropathy pain + recommend he restart B12 and place Voltaren gel on feet at night.  Recommend he check BS 3 days a week and document for provider visits.   - Foot exam up to date - Needs to schedule eye exam - Vaccines up to date - Statin on board.

## 2023-01-23 NOTE — Progress Notes (Signed)
BP 132/70 (BP Location: Left Arm)   Pulse 80   Temp 98.3 F (36.8 C) (Oral)   Ht 5\' 11"  (1.803 m)   Wt 201 lb 12.8 oz (91.5 kg)   SpO2 99%   BMI 28.15 kg/m    Subjective:    Patient ID: George Gomez, male    DOB: [birth year], 62 y.o.   MRN: [id number]  HPI: George Gomez is a 62 y.o. male  Chief Complaint  Patient presents with   Anemia   Diabetes    No recent eye exam per patient    Hyperlipidemia   Hypertension   DIABETES A1c in June 6.1%.  Continues on Metformin XL 500 MG daily. Continues on Gabapentin for neuropathy -- worse at night, burning and sharp pains.  Currently not taking B12 shots or oral supplement, recent level 374.   Hypoglycemic episodes:no Polydipsia/polyuria: no Visual disturbance: no Chest pain: no Paresthesias: no Glucose Monitoring: no  Accucheck frequency: Not Checking  Fasting glucose:  Post prandial:  Evening:  Before meals: Taking Insulin?: no  Long acting insulin:  Short acting insulin: Blood Pressure Monitoring: not checking Retinal Examination: Not up to Date -- [facility] Foot Exam: Up to Date Pneumovax: Up To Date Influenza: Up to Date Aspirin: no   HYPERTENSION / HYPERLIPIDEMIA Continues on Propranolol + Atorvastatin. Previously took ACE and ARB, both with severe reactions.  Took Amlodipine in past. Drinks a shot or two of liquor daily -- vodka, sometimes a little more over the holidays. Have noticed elevation in LFTs on labs in past. Satisfied with current treatment? yes Duration of hypertension: chronic BP monitoring frequency: twice a week BP range: 120-130/70 range BP medication side effects: no Duration of hyperlipidemia: chronic Cholesterol medication side effects: no Cholesterol supplements: none Medication compliance: good compliance Aspirin: no Recent stressors: no Recurrent headaches: no Visual changes: no Palpitations: no Dyspnea: no Chest pain: no Lower extremity edema: occasional Dizzy/lightheaded:  no  CHRONIC KIDNEY DISEASE CKD status: stable Medications renally dose: yes Previous renal evaluation: no Pneumovax:  Up to Date Influenza Vaccine:  Up to Date  ANEMIA Follows with hematology, last office visit was 10/15/22 -- no current B12 shots or iron infusions.  Saw GI on 12/30/22, they are planning for colonoscopy in February.  Continues Protonix dailyfor GERD.  Had one episode of dark, black stool a couple weeks back -- took no Pepto Bismol or iron prior. Has history of low magnesium levels and takes supplement for this.  Has had some abnormal weight loss over past months, which is one reason for GI visits. Anemia status: stable Etiology of anemia: B12 and iron deficiency Duration of anemia treatment: via hematology Compliance with treatment: good compliance Iron supplementation side effects: no Severity of anemia: moderate Fatigue: no Decreased exercise tolerance: no  Dyspnea on exertion: no Palpitations: no Bleeding: no Pica: no   Relevant past medical, surgical, family and social history reviewed and updated as indicated. Interim medical history since our last visit reviewed. Allergies and medications reviewed and updated.  Review of Systems  Constitutional:  Negative for activity change, diaphoresis, fatigue and fever.  Respiratory:  Negative for cough, chest tightness, shortness of breath and wheezing.   Cardiovascular:  Negative for chest pain, palpitations and leg swelling.  Endocrine: Negative for cold intolerance, heat intolerance, polydipsia, polyphagia and polyuria.  Musculoskeletal:  Positive for arthralgias.  Neurological:  Negative for dizziness, syncope, weakness, light-headedness, numbness and headaches.  Psychiatric/Behavioral: Negative.      Per HPI unless specifically  indicated above     Objective:    BP 132/70 (BP Location: Left Arm)   Pulse 80   Temp 98.3 F (36.8 C) (Oral)   Ht 5\' 11"  (1.803 m)   Wt 201 lb 12.8 oz (91.5 kg)   SpO2 99%    BMI 28.15 kg/m   Wt Readings from Last 3 Encounters:  01/23/23 201 lb 12.8 oz (91.5 kg)  12/16/22 202 lb 6.4 oz (91.8 kg)  12/09/22 198 lb (89.8 kg)    Physical Exam Vitals and nursing note reviewed.  Constitutional:      General: He is awake. He is not in acute distress.    Appearance: He is well-developed and well-groomed. He is obese. He is not ill-appearing or toxic-appearing.  HENT:     Head: Normocephalic and atraumatic.     Right Ear: Hearing and external ear normal. No drainage.     Left Ear: Hearing and external ear normal. No drainage.  Eyes:     General: Lids are normal.        Right eye: No discharge.        Left eye: No discharge.     Conjunctiva/sclera: Conjunctivae normal.     Pupils: Pupils are equal, round, and reactive to light.  Neck:     Thyroid: No thyromegaly.     Vascular: No carotid bruit.  Cardiovascular:     Rate and Rhythm: Normal rate and regular rhythm.     Heart sounds: Normal heart sounds, S1 normal and S2 normal. No murmur heard.    No gallop.  Pulmonary:     Effort: Pulmonary effort is normal. No accessory muscle usage or respiratory distress.     Breath sounds: Normal breath sounds.  Abdominal:     General: Bowel sounds are normal.     Palpations: Abdomen is soft.  Musculoskeletal:     Cervical back: Normal range of motion and neck supple.     Right lower leg: Edema (trace) present.     Left lower leg: Edema (trace) present.  Lymphadenopathy:     Cervical: No cervical adenopathy.  Skin:    General: Skin is warm and dry.     Capillary Refill: Capillary refill takes less than 2 seconds.     Findings: No rash.  Neurological:     Mental Status: He is alert and oriented to person, place, and time.     Cranial Nerves: Cranial nerves 2-12 are intact.     Sensory: Sensation is intact.     Coordination: Coordination is intact.     Gait: Gait is intact.     Deep Tendon Reflexes: Reflexes are normal and symmetric.     Reflex Scores:       Brachioradialis reflexes are 2+ on the right side and 2+ on the left side.      Patellar reflexes are 2+ on the right side and 2+ on the left side. Psychiatric:        Attention and Perception: Attention normal.        Mood and Affect: Mood normal.        Speech: Speech normal.        Behavior: Behavior normal. Behavior is cooperative.        Thought Content: Thought content normal.    Diabetic Foot Exam - Simple   Simple Foot Form Visual Inspection See comments: Yes Sensation Testing See comments: Yes Pulse Check Posterior Tibialis and Dorsalis pulse intact bilaterally: Yes Comments Dry skin to both  feet.  Sensation 6/10 both feet.     Results for orders placed or performed in visit on 01/14/23  Comprehensive metabolic panel   Collection Time: 01/14/23 11:22 AM  Result Value Ref Range   Sodium 135 135 - 145 mmol/L   Potassium 3.9 3.5 - 5.1 mmol/L   Chloride 97 (L) 98 - 111 mmol/L   CO2 25 22 - 32 mmol/L   Glucose, Bld 106 (H) 70 - 99 mg/dL   BUN 23 8 - 23 mg/dL   Creatinine, Ser 2.95 (H) 0.61 - 1.24 mg/dL   Calcium 9.1 8.9 - 62.1 mg/dL   Total Protein 8.0 6.5 - 8.1 g/dL   Albumin 4.2 3.5 - 5.0 g/dL   AST 80 (H) 15 - 41 U/L   ALT 34 0 - 44 U/L   Alkaline Phosphatase 86 38 - 126 U/L   Total Bilirubin 1.0 <1.2 mg/dL   GFR, Estimated >30 >86 mL/min   Anion gap 13 5 - 15      Assessment & Plan:   Problem List Items Addressed This Visit       Cardiovascular and Mediastinum   Hypertension associated with type 2 diabetes mellitus (HCC)   Chronic, stable.  BP at goal in office on recheck.  Recommend he monitor BP at least a few mornings a week at home and document.  DASH diet at home.  Continue current medication regimen and adjust as needed, Propranolol only at this time.  Labs today: CMP, TSH.  Urine ALB 80 March 2024, can not take ACE or ARB.          Relevant Medications   atorvastatin (LIPITOR) 40 MG tablet   propranolol ER (INDERAL LA) 60 MG 24 hr capsule    Other Relevant Orders   Bayer DCA Hb A1c Waived   Comprehensive metabolic panel   TSH     Endocrine   CKD stage 3 due to type 2 diabetes mellitus (HCC)   Chronic, ongoing with A1c 5.1% today with weight loss present over past months.  At this time have recommended he stop Metformin and will restart as needed in future.  Urine ALB 80 (March 2024), can not take ACE or ARB.  Continue Gabapentin for neuropathy pain.  Recommend he check BS 3 days a week and document for provider visits.   - Foot exam up to date - Needs to schedule eye exam - Vaccines up to date - Statin on board.      Relevant Medications   atorvastatin (LIPITOR) 40 MG tablet   Other Relevant Orders   Bayer DCA Hb A1c Waived   Comprehensive metabolic panel   Hyperlipidemia due to type 2 diabetes mellitus (HCC)   Chronic, ongoing.  Continue current medication regimen and adjust as needed.  Lipid panel today.        Relevant Medications   atorvastatin (LIPITOR) 40 MG tablet   propranolol ER (INDERAL LA) 60 MG 24 hr capsule   Other Relevant Orders   Bayer DCA Hb A1c Waived   Comprehensive metabolic panel   Lipid Panel w/o Chol/HDL Ratio   Type 2 diabetes mellitus with diabetic neuropathy (HCC) - Primary   Chronic, ongoing with A1c 5.1% today.  At this time recommend he stop Metformin, he has had some weight loss recently.  Urine ALB 80 (March 2024), can not take ACE or ARB.  Continue Gabapentin for neuropathy pain + recommend he restart B12 and place Voltaren gel on feet at night.  Recommend he  check BS 3 days a week and document for provider visits.   - Foot exam up to date - Needs to schedule eye exam - Vaccines up to date - Statin on board.      Relevant Medications   atorvastatin (LIPITOR) 40 MG tablet   Other Relevant Orders   Bayer DCA Hb A1c Waived     Hematopoietic and Hemostatic   Thrombocytopenia (HCC)   Chronic, ongoing.  Followed by hematology and recent notes reviewed.  Continue this collaboration,  recent note reviewed.        Other   Alcohol abuse   Chronic, he reports a few shots vodka daily.  Check CMP today and consider imaging if elevations.  Recommend working towards complete cessation of alcohol use.  Continue to collaborate with GI.      Relevant Orders   Comprehensive metabolic panel   V78 deficiency   Chronic, ongoing.  Followed by hematology and recent notes reviewed.  Recommend he restart supplement as having more neuropathy discomfort without it.      Chronic gout without tophus   Relevant Medications   allopurinol (ZYLOPRIM) 300 MG tablet   Elevated LFTs   Recheck on labs today and consider imaging in future if elevations trend up.  Recommend cut back on alcohol use.      Relevant Orders   Comprehensive metabolic panel   Hypomagnesemia   Recheck on labs today, taking supplement daily as instructed.  Is on PPI therapy which needs to be maintained at this time.  Recheck level today.      Relevant Orders   Magnesium   Normocytic anemia   Chronic, ongoing.  Followed by hematology and recent notes reviewed.  Continue to collaborate with them.      Relevant Medications   cyanocobalamin (VITAMIN B12) 1000 MCG tablet   Other Visit Diagnoses       Benign prostatic hyperplasia without lower urinary tract symptoms       PSA on labs today for annual check   Relevant Orders   PSA        Follow up plan: Return in about 3 months (around 04/23/2023) for T2DM, HTN/HLD.

## 2023-01-23 NOTE — Assessment & Plan Note (Signed)
Chronic, ongoing.  Followed by hematology and recent notes reviewed.  Continue this collaboration, recent note reviewed.

## 2023-01-23 NOTE — Assessment & Plan Note (Signed)
Recheck on labs today, taking supplement daily as instructed.  Is on PPI therapy which needs to be maintained at this time.  Recheck level today.

## 2023-01-23 NOTE — Assessment & Plan Note (Signed)
Chronic, he reports a few shots vodka daily.  Check CMP today and consider imaging if elevations.  Recommend working towards complete cessation of alcohol use.  Continue to collaborate with GI.

## 2023-01-23 NOTE — Assessment & Plan Note (Signed)
Chronic, stable.  BP at goal in office on recheck.  Recommend he monitor BP at least a few mornings a week at home and document.  DASH diet at home.  Continue current medication regimen and adjust as needed, Propranolol only at this time.  Labs today: CMP, TSH.  Urine ALB 80 March 2024, can not take ACE or ARB.

## 2023-01-23 NOTE — Assessment & Plan Note (Signed)
Chronic, ongoing.  Followed by hematology and recent notes reviewed.  Recommend he restart supplement as having more neuropathy discomfort without it.

## 2023-01-23 NOTE — Assessment & Plan Note (Signed)
Chronic, ongoing.  Followed by hematology and recent notes reviewed.  Continue to collaborate with them.

## 2023-01-23 NOTE — Assessment & Plan Note (Addendum)
Chronic, ongoing with A1c 5.1% today with weight loss present over past months.  At this time have recommended he stop Metformin and will restart as needed in future.  Urine ALB 80 (March 2024), can not take ACE or ARB.  Continue Gabapentin for neuropathy pain.  Recommend he check BS 3 days a week and document for provider visits.   - Foot exam up to date - Needs to schedule eye exam - Vaccines up to date - Statin on board.

## 2023-01-23 NOTE — Assessment & Plan Note (Signed)
Chronic, ongoing.  Continue current medication regimen and adjust as needed. Lipid panel today. 

## 2023-01-24 ENCOUNTER — Other Ambulatory Visit: Payer: Self-pay | Admitting: Nurse Practitioner

## 2023-01-24 DIAGNOSIS — N183 Chronic kidney disease, stage 3 unspecified: Secondary | ICD-10-CM

## 2023-01-24 LAB — COMPREHENSIVE METABOLIC PANEL
ALT: 18 [IU]/L (ref 0–44)
AST: 54 [IU]/L — ABNORMAL HIGH (ref 0–40)
Albumin: 4.2 g/dL (ref 3.9–4.9)
Alkaline Phosphatase: 103 [IU]/L (ref 44–121)
BUN/Creatinine Ratio: 11 (ref 10–24)
BUN: 10 mg/dL (ref 8–27)
Bilirubin Total: 0.3 mg/dL (ref 0.0–1.2)
CO2: 21 mmol/L (ref 20–29)
Calcium: 8.9 mg/dL (ref 8.6–10.2)
Chloride: 101 mmol/L (ref 96–106)
Creatinine, Ser: 0.88 mg/dL (ref 0.76–1.27)
Globulin, Total: 2.6 g/dL (ref 1.5–4.5)
Glucose: 96 mg/dL (ref 70–99)
Potassium: 3.2 mmol/L — ABNORMAL LOW (ref 3.5–5.2)
Sodium: 143 mmol/L (ref 134–144)
Total Protein: 6.8 g/dL (ref 6.0–8.5)
eGFR: 96 mL/min/{1.73_m2} (ref >59)

## 2023-01-24 LAB — LIPID PANEL W/O CHOL/HDL RATIO
Cholesterol, Total: 191 mg/dL (ref 100–199)
HDL: 104 mg/dL (ref >39)
LDL Chol Calc (NIH): 70 mg/dL (ref 0–99)
Triglycerides: 100 mg/dL (ref 0–149)
VLDL Cholesterol Cal: 17 mg/dL (ref 5–40)

## 2023-01-24 LAB — MAGNESIUM: Magnesium: 1 mg/dL — ABNORMAL LOW (ref 1.6–2.3)

## 2023-01-24 LAB — TSH: TSH: 1.91 u[IU]/mL (ref 0.450–4.500)

## 2023-01-24 LAB — PSA: Prostate Specific Ag, Serum: 1.5 ng/mL (ref 0.0–4.0)

## 2023-01-24 MED ORDER — MAGNESIUM OXIDE -MG SUPPLEMENT 400 (240 MG) MG PO TABS: 400.0000 mg | ORAL_TABLET | Freq: Two times a day (BID) | ORAL | 4 refills | Status: DC

## 2023-01-24 MED ORDER — POTASSIUM CHLORIDE CRYS ER 10 MEQ PO TBCR: 10.0000 meq | EXTENDED_RELEASE_TABLET | Freq: Every day | ORAL | 0 refills | Status: DC

## 2023-01-26 NOTE — Progress Notes (Signed)
Attempted to reach patient, LVM to call office back to get scheduled for lab only visit in 2 weeks. Will put in CRM.

## 2023-01-29 ENCOUNTER — Encounter: Payer: Self-pay | Admitting: Hematology and Oncology

## 2023-01-29 NOTE — Telephone Encounter (Signed)
 Created in error

## 2023-02-09 ENCOUNTER — Other Ambulatory Visit: Payer: 59

## 2023-02-09 DIAGNOSIS — N183 Chronic kidney disease, stage 3 unspecified: Secondary | ICD-10-CM

## 2023-02-10 ENCOUNTER — Other Ambulatory Visit: Payer: Self-pay | Admitting: Nurse Practitioner

## 2023-02-10 LAB — BASIC METABOLIC PANEL
BUN/Creatinine Ratio: 9 — ABNORMAL LOW (ref 10–24)
BUN: 10 mg/dL (ref 8–27)
CO2: 21 mmol/L (ref 20–29)
Calcium: 8.5 mg/dL — ABNORMAL LOW (ref 8.6–10.2)
Chloride: 95 mmol/L — ABNORMAL LOW (ref 96–106)
Creatinine, Ser: 1.14 mg/dL (ref 0.76–1.27)
Glucose: 131 mg/dL — ABNORMAL HIGH (ref 70–99)
Potassium: 3.3 mmol/L — ABNORMAL LOW (ref 3.5–5.2)
Sodium: 137 mmol/L (ref 134–144)
eGFR: 72 mL/min/{1.73_m2} (ref >59)

## 2023-02-10 LAB — MAGNESIUM: Magnesium: 0.7 mg/dL — CL (ref 1.6–2.3)

## 2023-02-10 MED ORDER — POTASSIUM CHLORIDE CRYS ER 10 MEQ PO TBCR: 20.0000 meq | EXTENDED_RELEASE_TABLET | Freq: Every day | ORAL | 0 refills | Status: DC

## 2023-02-10 NOTE — Progress Notes (Signed)
 Good morning, please let Dylon know his labs have returned and magnesium  level is actually lower than last check.  Is he taking the magnesium  supplement I sent in twice a day?  If not he needs to start this and take every day.  Potassium level remains on lower side.  I have sent in supplement at a bit of higher dose for him to take again for 5 days.  I would like to recheck levels in one week outpatient.  Any questions? Keep being amazing!!  Thank you for allowing me to participate in your care.  I appreciate you. Kindest regards, Ares Cardozo

## 2023-02-13 ENCOUNTER — Encounter: Payer: Self-pay | Admitting: Hematology and Oncology

## 2023-02-17 ENCOUNTER — Ambulatory Visit (INDEPENDENT_AMBULATORY_CARE_PROVIDER_SITE_OTHER): Payer: 59 | Admitting: Emergency Medicine

## 2023-02-17 VITALS — Ht 71.0 in | Wt 193.0 lb

## 2023-02-17 DIAGNOSIS — Z0000 Encounter for general adult medical examination without abnormal findings: Secondary | ICD-10-CM | POA: Diagnosis not present

## 2023-02-17 NOTE — Progress Notes (Signed)
Subjective:   George Gomez is a 63 y.o. male who presents for Medicare Annual/Subsequent preventive examination.  This patient declined Interactive audio and Acupuncturist. Therefore the visit was completed with audio only.   Visit Complete: Virtual I connected with  [name] on 02/17/23 by a audio enabled telemedicine application and verified that I am speaking with the correct person using two identifiers.  Patient Location: Home  Provider Location: Office/Clinic  I discussed the limitations of evaluation and management by telemedicine. The patient expressed understanding and agreed to proceed.  Vital Signs: Because this visit was a virtual/telehealth visit, some criteria may be missing or patient reported. Any vitals not documented were not able to be obtained and vitals that have been documented are patient reported.   Cardiac Risk Factors include: advanced age (>46men, >17 women);male gender;hypertension;diabetes mellitus;dyslipidemia     Objective:    Today's Vitals   02/17/23 0930  Weight: 193 lb (87.5 kg)  Height: 5\' 11"  (1.803 m)   Body mass index is 26.92 kg/m.     02/17/2023    9:46 AM 12/09/2022   12:53 PM 10/15/2022   11:10 AM 09/12/2022    9:54 AM 11/26/2021   11:09 AM 09/24/2021    8:51 AM 08/21/2021   11:17 AM  Advanced Directives  Does Patient Have a Medical Advance Directive? No No No No No No No  Would patient like information on creating a medical advance directive? Yes (MAU/Ambulatory/Procedural Areas - Information given)  No - Patient declined  No - Patient declined No - Patient declined No - Patient declined    Current Medications (verified) Outpatient Encounter Medications as of 02/17/2023  Medication Sig   gabapentin (NEURONTIN) 300 MG capsule Take one tablet (300 MG) by mouth in the morning, then take one tablet (300 MG) by mouth at noon, and then take 600 MG (two tablets) by mouth before bedtime. (Patient taking differently:  Take 1 capsule by mouth in the morning, 1 capsule at noon, and 2 capsules at bedtime as needed for neuropathy)   allopurinol (ZYLOPRIM) 300 MG tablet Take 1 tablet (300 mg total) by mouth daily. (Patient not taking: Reported on 02/17/2023)   atorvastatin (LIPITOR) 40 MG tablet Take 1 tablet (40 mg total) by mouth daily. (Patient not taking: Reported on 02/17/2023)   Blood Glucose Monitoring Suppl (ONETOUCH VERIO) w/Device KIT Use to check blood sugar 3 times a day and document results, bring to appointments.  Goal is <130 fasting blood sugar and <180 two hours after meals. (Patient not taking: Reported on 02/17/2023)   Cholecalciferol 50 MCG (2000 UT) CAPS Take 1 capsule by mouth daily. (Patient not taking: Reported on 02/17/2023)   cyanocobalamin (VITAMIN B12) 1000 MCG tablet Take 1 tablet (1,000 mcg total) by mouth daily. (Patient not taking: Reported on 02/17/2023)   glucose blood (ONETOUCH VERIO) test strip Use to check blood sugar 3 times a day and document results, bring to appointments.  Goal is <130 fasting blood sugar and <180 two hours after meals. (Patient not taking: Reported on 02/17/2023)   Lancets (ONETOUCH ULTRASOFT) lancets Use to check blood sugar 3 times a day and document results, bring to appointments.  Goal is <130 fasting blood sugar and <180 two hours after meals. (Patient not taking: Reported on 02/17/2023)   magnesium oxide (MAG-OX) 400 (240 Mg) MG tablet Take 1 tablet (400 mg total) by mouth 2 (two) times daily. (Patient not taking: Reported on 02/17/2023)   metFORMIN (GLUCOPHAGE-XR) 500 MG 24 hr  tablet Take 1 tablet (500 mg total) by mouth daily. (Patient not taking: Reported on 02/17/2023)   ondansetron (ZOFRAN) 4 MG tablet Take 1 tablet (4 mg total) by mouth every 8 (eight) hours as needed. (Patient not taking: Reported on 02/17/2023)   pantoprazole (PROTONIX) 40 MG tablet Take 1 tablet (40 mg total) by mouth daily. (Patient not taking: Reported on 02/17/2023)   potassium chloride  (KLOR-CON M) 10 MEQ tablet Take 2 tablets (20 mEq total) by mouth daily for 5 days. (Patient not taking: Reported on 02/17/2023)   propranolol ER (INDERAL LA) 60 MG 24 hr capsule Take 1 capsule (60 mg total) by mouth daily. (Patient not taking: Reported on 02/17/2023)   tiZANidine (ZANAFLEX) 2 MG tablet TAKE 1 TABLET(2 MG) BY MOUTH EVERY 8 HOURS AS NEEDED FOR MUSCLE SPASMS (Patient not taking: Reported on 02/17/2023)   triamcinolone cream (KENALOG) 0.1 % Apply 1 application topically 2 (two) times daily. (Patient not taking: Reported on 02/17/2023)   VOLTAREN 1 % GEL Apply 4 gram to affected area four times a day as needed for pain for knee pain. Do not exceed 16gm for LE joint per day (Patient not taking: Reported on 02/17/2023)   No facility-administered encounter medications on file as of 02/17/2023.    Allergies (verified) Lisinopril and Losartan   History: Past Medical History:  Diagnosis Date   Acute on chronic renal failure (HCC) 03/20/2016   Acute renal failure (ARF) (HCC) 08/28/2016   Acute renal failure (HCC) 10/12/2018   Anemia    B12 deficiency 12/11/2017   Clostridium difficile diarrhea 03/18/2016   Diabetes mellitus without complication (HCC)    Diverticulitis    Pt states diverticulitis   EtOH dependence (HCC)    Folate deficiency 07/25/2018   GI bleed 05/30/2017   Hypercholesteremia    Hypertension    Melena 10/21/2018   Weight loss 09/15/2018   Past Surgical History:  Procedure Laterality Date   COLONOSCOPY WITH PROPOFOL N/A 10/16/2018   Procedure: COLONOSCOPY WITH PROPOFOL;  Surgeon: Toney Reil, MD;  Location: [facility] ENDOSCOPY;  Service: Gastroenterology;  Laterality: N/A;   ENTEROSCOPY N/A 10/16/2018   Procedure: ENTEROSCOPY;  Surgeon: Toney Reil, MD;  Location[facility] ENDOSCOPY;  Service: Gastroenterology;  Laterality: N/A;   ESOPHAGOGASTRODUODENOSCOPY N/A 07/02/2018   Procedure: ESOPHAGOGASTRODUODENOSCOPY (EGD);  Surgeon: Toney Reil, MD;  Location: [facility]  ENDOSCOPY;  Service: Gastroenterology;  Laterality: N/A;   ESOPHAGOGASTRODUODENOSCOPY (EGD) WITH PROPOFOL N/A 06/01/2017   Procedure: ESOPHAGOGASTRODUODENOSCOPY (EGD) WITH PROPOFOL;  Surgeon: Wyline Mood, MD;  Location: [facility] ENDOSCOPY;  Service: Gastroenterology;  Laterality: N/A;   GIVENS CAPSULE STUDY N/A 10/17/2018   Procedure: GIVENS CAPSULE STUDY;  Surgeon: Toney Reil, MD;  Location: [facility] ENDOSCOPY;  Service: Gastroenterology;  Laterality: N/A;   GIVENS CAPSULE STUDY N/A 08/02/2019   Procedure: GIVENS CAPSULE STUDY;  Surgeon: Toney Reil, MD;  Location: [facility] ENDOSCOPY;  Service: Gastroenterology;  Laterality: N/A;   none     Family History  Problem Relation Age of Onset   Rheum arthritis Mother    Diabetes Father    Heart disease Father    Cancer Father    COPD Sister    Rheum arthritis Sister    Diabetes Maternal Grandmother    Cancer Maternal Grandfather    Diabetes Paternal Grandmother    Cancer Paternal Grandfather    Social History   Socioeconomic History   Marital status: Divorced    Spouse name: Not on file   Number of children: 0  Years of education: Not on file   Highest education level: Not on file  Occupational History   Occupation: on disability  Tobacco Use   Smoking status: Never   Smokeless tobacco: Never  Vaping Use   Vaping status: Never Used  Substance and Sexual Activity   Alcohol use: Not Currently    Alcohol/week: 3.0 standard drinks of alcohol    Types: 3 Shots of liquor per week    Comment: 3 shots of liquor daily   Drug use: No   Sexual activity: Yes  Other Topics Concern   Not on file  Social History Narrative   Not on file   Social Drivers of Health   Financial Resource Strain: Low Risk  (02/17/2023)   Overall Financial Resource Strain (CARDIA)    Difficulty of Paying Living Expenses: Not hard at all  Food Insecurity: No Food Insecurity (02/17/2023)   Hunger Vital Sign    Worried About Running Out of Food in the Last  Year: Never true    Ran Out of Food in the Last Year: Never true  Transportation Needs: No Transportation Needs (02/17/2023)   PRAPARE - Administrator, Civil Service (Medical): No    Lack of Transportation (Non-Medical): No  Physical Activity: Inactive (02/17/2023)   Exercise Vital Sign    Days of Exercise per Week: 0 days    Minutes of Exercise per Session: 0 min  Stress: No Stress Concern Present (02/17/2023)   [employer] of Occupational Health - Occupational Stress Questionnaire    Feeling of Stress : Not at all  Social Connections: Socially Isolated (02/17/2023)   Social Connection and Isolation Panel [NHANES]    Frequency of Communication with Friends and Family: More than three times a week    Frequency of Social Gatherings with Friends and Family: More than three times a week    Attends Religious Services: Never    Database administrator or Organizations: No    Attends Engineer, structural: Never    Marital Status: Divorced    Tobacco Counseling Counseling given: Not Answered   Clinical Intake:  Pre-visit preparation completed: Yes  Pain : No/denies pain     BMI - recorded: 26.92 Nutritional Status: BMI 25 -29 Overweight Nutritional Risks: None Diabetes: Yes CBG done?: No Did pt. bring in CBG monitor from home?: No  How often do you need to have someone help you when you read instructions, pamphlets, or other written materials from your doctor or pharmacy?: 1 - Never  Interpreter Needed?: No  Information entered by :: Tora Kindred, CMA   Activities of Daily Living    02/17/2023    9:34 AM  In your present state of health, do you have any difficulty performing the following activities:  Hearing? 0  Vision? 0  Difficulty concentrating or making decisions? 0  Walking or climbing stairs? 1  Comment stairs  Dressing or bathing? 0  Doing errands, shopping? 0  Preparing Food and eating ? N  Using the Toilet? N  In the past six  months, have you accidently leaked urine? N  Do you have problems with loss of bowel control? N  Managing your Medications? N  Managing your Finances? N  Housekeeping or managing your Housekeeping? N    Patient Care Team: Marjie Skiff, NP as PCP - General (Nurse Practitioner) Toney Reil, MD as Consulting Physician (Gastroenterology) Creig Hines, MD as Consulting Physician (Hematology and Oncology)  Indicate any recent Medical  Services you may have received from other than [facility] providers in the past year (date may be approximate).     Assessment:   This is a routine wellness examination for Jhai.  Hearing/Vision screen Hearing Screening - Comments:: No hearing loss Vision Screening - Comments:: Needs diabetic eye exam, [facility]   Goals Addressed               This Visit's Progress     Reduce alcohol intake (pt-stated)        Depression Screen    02/17/2023    9:44 AM 07/24/2022   10:27 AM 01/01/2022   12:45 PM 09/24/2021    8:56 AM 07/09/2021    9:51 AM 01/14/2021    9:40 AM 07/19/2020    8:37 AM  PHQ 2/9 Scores  PHQ - 2 Score 0 1 2 0 3 0 0  PHQ- 9 Score  4 3 3 8 5      Fall Risk    02/17/2023    9:48 AM 01/01/2022   10:32 AM 07/09/2021    9:51 AM 01/14/2021    9:40 AM 07/19/2020    8:37 AM  Fall Risk   Falls in the past year? 1 1 0 0 0  Number falls in past yr: 1 1 0 0 0  Injury with Fall? 1 1 0 0 0  Risk for fall due to : History of fall(s);Impaired balance/gait;Orthopedic patient;Impaired mobility History of fall(s) No Fall Risks  No Fall Risks  Follow up Education provided;Falls prevention discussed;Falls evaluation completed Falls evaluation completed Falls evaluation completed  Falls evaluation completed    MEDICARE RISK AT HOME: Medicare Risk at Home Any stairs in or around the home?: Yes (1 step) If so, are there any without handrails?: Yes Home free of loose throw rugs in walkways, pet beds, electrical cords, etc?: Yes Adequate  lighting in your home to reduce risk of falls?: Yes Life alert?: No Use of a cane, walker or w/c?: Yes (cane prn) Grab bars in the bathroom?: No Shower chair or bench in shower?: No Elevated toilet seat or a handicapped toilet?: No  TIMED UP AND GO:  Was the test performed?  No    Cognitive Function:        02/17/2023    9:50 AM 09/24/2021    8:49 AM  6CIT Screen  What Year? 0 points 0 points  What month? 0 points 0 points  What time? 0 points 0 points  Count back from 20 0 points 0 points  Months in reverse 4 points 0 points  Repeat phrase 0 points 2 points  Total Score 4 points 2 points    Immunizations Immunization History  Administered Date(s) Administered   Hep A / Hep B 08/22/2019, 02/06/2020   Influenza, Seasonal, Injecte, Preservative Fre 12/16/2022   Influenza,inj,Quad PF,6+ Mos 10/13/2018, 01/14/2021, 01/01/2022   PFIZER(Purple Top)SARS-COV-2 Vaccination 04/26/2019, 05/17/2019, 01/05/2020, 12/17/2020   PNEUMOCOCCAL CONJUGATE-20 07/24/2022   Pfizer(Comirnaty)Fall Seasonal Vaccine 12 years and older 12/16/2022   Td 07/24/2022    TDAP status: Up to date  Flu Vaccine status: Up to date  Pneumococcal vaccine status: Up to date  Covid-19 vaccine status: Completed vaccines  Qualifies for Shingles Vaccine? Yes   Zostavax completed No   Shingrix Completed?: No.    Education has been provided regarding the importance of this vaccine. Patient has been advised to call insurance company to determine out of pocket expense if they have not yet received this vaccine. Advised may also  receive vaccine at local pharmacy or Health Dept. Verbalized acceptance and understanding.  Screening Tests Health Maintenance  Topic Date Due   OPHTHALMOLOGY EXAM  Never done   Zoster Vaccines- Shingrix (1 of 2) Never done   COVID-19 Vaccine (6 - 2024-25 season) 02/10/2023   Diabetic kidney evaluation - Urine ACR  04/23/2023   HEMOGLOBIN A1C  07/24/2023   FOOT EXAM  01/23/2024    Diabetic kidney evaluation - eGFR measurement  02/09/2024   Medicare Annual Wellness (AWV)  02/17/2024   Colonoscopy  10/15/2028   DTaP/Tdap/Td (2 - Tdap) 07/23/2032   Pneumococcal Vaccine 41-64 Years old  Completed   INFLUENZA VACCINE  Completed   Hepatitis C Screening  Completed   HIV Screening  Completed   HPV VACCINES  Aged Out    Health Maintenance  Health Maintenance Due  Topic Date Due   OPHTHALMOLOGY EXAM  Never done   Zoster Vaccines- Shingrix (1 of 2) Never done   COVID-19 Vaccine (6 - 2024-25 season) 02/10/2023    Colorectal cancer screening: Type of screening: Colonoscopy. Completed 10/16/18. Repeat every 10 years. Has colonoscopy scheduled 03/13/23 for weight loss  Lung Cancer Screening: (Low Dose CT Chest recommended if Age 89-80 years, 20 pack-year currently smoking OR have quit w/in 15years.) does not qualify.   Lung Cancer Screening Referral: n/a  Additional Screening:  Hepatitis C Screening: does not qualify; Completed 08/16/19  Vision Screening: Recommended annual ophthalmology exams for early detection of glaucoma and other disorders of the eye. Is the patient up to date with their annual eye exam?  No  Who is the provider or what is the name of the office in which the patient attends annual eye exams? [facility] If pt is not established with a provider, would they like to be referred to a provider to establish care? No .   Dental Screening: Recommended annual dental exams for proper oral hygiene  Diabetic Foot Exam: Diabetic Foot Exam: Completed 01/23/23  Community Resource Referral / Chronic Care Management: CRR required this visit?  No   CCM required this visit?  No     Plan:     I have personally reviewed and noted the following in the patient's chart:   Medical and social history Use of alcohol, tobacco or illicit drugs  Current medications and supplements including opioid prescriptions. Patient is not currently taking opioid  prescriptions. Functional ability and status Nutritional status Physical activity Advanced directives List of other physicians Hospitalizations, surgeries, and ER visits in previous 12 months Vitals Screenings to include cognitive, depression, and falls Referrals and appointments  In addition, I have reviewed and discussed with patient certain preventive protocols, quality metrics, and best practice recommendations. A written personalized care plan for preventive services as well as general preventive health recommendations were provided to patient.     Tora Kindred, CMA   02/17/2023   After Visit Summary: (MyChart) Due to this being a telephonic visit, the after visit summary with patients personalized plan was offered to patient via MyChart   Nurse Notes:  6 CIT Score - 4 Declined DM & Nutrition education Has colonoscopy scheduled for 03/13/23 for weight loss. Patient is not taking any medications at this time Patient is drinking heavily. Declined any assistance. Needs DM eye exam. Patient to call [facility] center to schedule

## 2023-02-17 NOTE — Patient Instructions (Addendum)
George Gomez , Thank you for taking time to come for your Medicare Wellness Visit. I appreciate your ongoing commitment to your health goals. Please review the following plan we discussed and let me know if I can assist you in the future.   Referrals/Orders/Follow-Ups/Clinician Recommendations: Call [facility] center and schedule a diabetic eye exam at your earliest convenience.  This is a list of the screening recommended for you and due dates:  Health Maintenance  Topic Date Due   Eye exam for diabetics  Never done   Zoster (Shingles) Vaccine (1 of 2) Never done   COVID-19 Vaccine (6 - 2024-25 season) 02/10/2023   Yearly kidney health urinalysis for diabetes  04/23/2023   Hemoglobin A1C  07/24/2023   Complete foot exam   01/23/2024   Yearly kidney function blood test for diabetes  02/09/2024   Medicare Annual Wellness Visit  02/17/2024   Colon Cancer Screening  10/15/2028   DTaP/Tdap/Td vaccine (2 - Tdap) 07/23/2032   Pneumococcal Vaccination  Completed   Flu Shot  Completed   Hepatitis C Screening  Completed   HIV Screening  Completed   HPV Vaccine  Aged Out    Advanced directives: (ACP Link)Information on Advanced Care Planning can be found at [facility] Advance Health Care Directives Advance Health Care Directives ([email or web address]   Next Medicare Annual Wellness Visit scheduled for next year: Yes, 02/23/24 @ 9:20 (phone visit)  Fall Prevention in the Home, Adult Falls can cause injuries and affect people of all ages. There are many simple things that you can do to make your home safe and to help prevent falls. If you need it, ask for help making these changes. What actions can I take to prevent falls? General information Use good lighting in all rooms. Make sure to: Replace any light bulbs that burn out. Turn on lights if it is dark and use night-lights. Keep items that you use often in easy-to-reach places. Lower the shelves around your home if  needed. Move furniture so that there are clear paths around it. Do not keep throw rugs or other things on the floor that can make you trip. If any of your floors are uneven, fix them. Add color or contrast paint or tape to clearly mark and help you see: Grab bars or handrails. First and last steps of staircases. Where the edge of each step is. If you use a ladder or stepladder: Make sure that it is fully opened. Do not climb a closed ladder. Make sure the sides of the ladder are locked in place. Have someone hold the ladder while you use it. Know where your pets are as you move through your home. What can I do in the bathroom?     Keep the floor dry. Clean up any water that is on the floor right away. Remove soap buildup in the bathtub or shower. Buildup makes bathtubs and showers slippery. Use non-skid mats or decals on the floor of the bathtub or shower. Attach bath mats securely with double-sided, non-slip rug tape. If you need to sit down while you are in the shower, use a non-slip stool. Install grab bars by the toilet and in the bathtub and shower. Do not use towel bars as grab bars. What can I do in the bedroom? Make sure that you have a light by your bed that is easy to reach. Do not use any sheets or blankets on your bed that hang to the floor. Have a  firm bench or chair with side arms that you can use for support when you get dressed. What can I do in the kitchen? Clean up any spills right away. If you need to reach something above you, use a sturdy step stool that has a grab bar. Keep electrical cables out of the way. Do not use floor polish or wax that makes floors slippery. What can I do with my stairs? Do not leave anything on the stairs. Make sure that you have a light switch at the top and the bottom of the stairs. Have them installed if you do not have them. Make sure that there are handrails on both sides of the stairs. Fix handrails that are broken or loose. Make  sure that handrails are as long as the staircases. Install non-slip stair treads on all stairs in your home if they do not have carpet. Avoid having throw rugs at the top or bottom of stairs, or secure the rugs with carpet tape to prevent them from moving. Choose a carpet design that does not hide the edge of steps on the stairs. Make sure that carpet is firmly attached to the stairs. Fix any carpet that is loose or worn. What can I do on the outside of my home? Use bright outdoor lighting. Repair the edges of walkways and driveways and fix any cracks. Clear paths of anything that can make you trip, such as tools or rocks. Add color or contrast paint or tape to clearly mark and help you see high doorway thresholds. Trim any bushes or trees on the main path into your home. Check that handrails are securely fastened and in good repair. Both sides of all steps should have handrails. Install guardrails along the edges of any raised decks or porches. Have leaves, snow, and ice cleared regularly. Use sand, salt, or ice melt on walkways during winter months if you live where there is ice and snow. In the garage, clean up any spills right away, including grease or oil spills. What other actions can I take? Review your medicines with your health care provider. Some medicines can make you confused or feel dizzy. This can increase your chance of falling. Wear closed-toe shoes that fit well and support your feet. Wear shoes that have rubber soles and low heels. Use a cane, walker, scooter, or crutches that help you move around if needed. Talk with your provider about other ways that you can decrease your risk of falls. This may include seeing a physical therapist to learn to do exercises to improve movement and strength. Where to find more information Centers for Disease Control and Prevention, STEADI: TonerPromos.no [employer] on Aging: BaseRingTones.pl National Institute on Aging: BaseRingTones.pl Contact a  health care provider if: You are afraid of falling at home. You feel weak, drowsy, or dizzy at home. You fall at home. Get help right away if you: Lose consciousness or have trouble moving after a fall. Have a fall that causes a head injury. These symptoms may be an emergency. Get help right away. Call 911. Do not wait to see if the symptoms will go away. Do not drive yourself to the hospital. This information is not intended to replace advice given to you by your health care provider. Make sure you discuss any questions you have with your health care provider. Document Revised: 09/16/2021 Document Reviewed: 09/16/2021 Elsevier Patient Education  2024 [employer].

## 2023-02-18 ENCOUNTER — Other Ambulatory Visit: Payer: 59

## 2023-02-20 ENCOUNTER — Emergency Department: Payer: 59

## 2023-02-20 ENCOUNTER — Observation Stay: Payer: 59

## 2023-02-20 ENCOUNTER — Inpatient Hospital Stay
Admission: EM | Admit: 2023-02-20 | Discharge: 2023-02-22 | DRG: 897 | Disposition: A | Discharge status: Home / Self Care | Payer: 59 | Attending: Internal Medicine | Admitting: Internal Medicine

## 2023-02-20 ENCOUNTER — Ambulatory Visit: Payer: 59

## 2023-02-20 ENCOUNTER — Encounter: Payer: Self-pay | Admitting: Hematology and Oncology

## 2023-02-20 ENCOUNTER — Observation Stay
Admit: 2023-02-20 | Discharge: 2023-02-20 | Disposition: A | Discharge status: Home / Self Care | Payer: 59 | Attending: Internal Medicine

## 2023-02-20 DIAGNOSIS — G459 Transient cerebral ischemic attack, unspecified: Principal | ICD-10-CM

## 2023-02-20 DIAGNOSIS — Z79899 Other long term (current) drug therapy: Secondary | ICD-10-CM

## 2023-02-20 DIAGNOSIS — I6389 Other cerebral infarction: Secondary | ICD-10-CM | POA: Diagnosis not present

## 2023-02-20 DIAGNOSIS — Z825 Family history of asthma and other chronic lower respiratory diseases: Secondary | ICD-10-CM | POA: Diagnosis not present

## 2023-02-20 DIAGNOSIS — F10939 Alcohol use, unspecified with withdrawal, unspecified: Secondary | ICD-10-CM | POA: Diagnosis present

## 2023-02-20 DIAGNOSIS — R29810 Facial weakness: Secondary | ICD-10-CM | POA: Diagnosis present

## 2023-02-20 DIAGNOSIS — Y901 Blood alcohol level of 20-39 mg/100 ml: Secondary | ICD-10-CM | POA: Diagnosis present

## 2023-02-20 DIAGNOSIS — E1122 Type 2 diabetes mellitus with diabetic chronic kidney disease: Secondary | ICD-10-CM | POA: Diagnosis present

## 2023-02-20 DIAGNOSIS — N1831 Chronic kidney disease, stage 3a: Secondary | ICD-10-CM | POA: Diagnosis present

## 2023-02-20 DIAGNOSIS — E1140 Type 2 diabetes mellitus with diabetic neuropathy, unspecified: Secondary | ICD-10-CM | POA: Diagnosis present

## 2023-02-20 DIAGNOSIS — Z8261 Family history of arthritis: Secondary | ICD-10-CM

## 2023-02-20 DIAGNOSIS — I152 Hypertension secondary to endocrine disorders: Secondary | ICD-10-CM | POA: Diagnosis present

## 2023-02-20 DIAGNOSIS — K219 Gastro-esophageal reflux disease without esophagitis: Secondary | ICD-10-CM | POA: Diagnosis present

## 2023-02-20 DIAGNOSIS — D6959 Other secondary thrombocytopenia: Secondary | ICD-10-CM | POA: Diagnosis present

## 2023-02-20 DIAGNOSIS — Z809 Family history of malignant neoplasm, unspecified: Secondary | ICD-10-CM | POA: Diagnosis not present

## 2023-02-20 DIAGNOSIS — E7800 Pure hypercholesterolemia, unspecified: Secondary | ICD-10-CM | POA: Diagnosis present

## 2023-02-20 DIAGNOSIS — Z7984 Long term (current) use of oral hypoglycemic drugs: Secondary | ICD-10-CM | POA: Diagnosis not present

## 2023-02-20 DIAGNOSIS — Z8673 Personal history of transient ischemic attack (TIA), and cerebral infarction without residual deficits: Secondary | ICD-10-CM | POA: Diagnosis not present

## 2023-02-20 DIAGNOSIS — F10239 Alcohol dependence with withdrawal, unspecified: Principal | ICD-10-CM | POA: Diagnosis present

## 2023-02-20 DIAGNOSIS — Z888 Allergy status to other drugs, medicaments and biological substances status: Secondary | ICD-10-CM

## 2023-02-20 DIAGNOSIS — F1010 Alcohol abuse, uncomplicated: Secondary | ICD-10-CM | POA: Diagnosis present

## 2023-02-20 DIAGNOSIS — E1169 Type 2 diabetes mellitus with other specified complication: Secondary | ICD-10-CM | POA: Diagnosis present

## 2023-02-20 DIAGNOSIS — K760 Fatty (change of) liver, not elsewhere classified: Secondary | ICD-10-CM | POA: Diagnosis present

## 2023-02-20 DIAGNOSIS — R54 Age-related physical debility: Secondary | ICD-10-CM | POA: Diagnosis present

## 2023-02-20 DIAGNOSIS — R2990 Unspecified symptoms and signs involving the nervous system: Secondary | ICD-10-CM | POA: Diagnosis present

## 2023-02-20 DIAGNOSIS — D696 Thrombocytopenia, unspecified: Secondary | ICD-10-CM | POA: Diagnosis present

## 2023-02-20 DIAGNOSIS — R569 Unspecified convulsions: Secondary | ICD-10-CM | POA: Diagnosis present

## 2023-02-20 DIAGNOSIS — E8729 Other acidosis: Secondary | ICD-10-CM | POA: Diagnosis present

## 2023-02-20 DIAGNOSIS — E785 Hyperlipidemia, unspecified: Secondary | ICD-10-CM | POA: Diagnosis present

## 2023-02-20 DIAGNOSIS — E1129 Type 2 diabetes mellitus with other diabetic kidney complication: Secondary | ICD-10-CM | POA: Diagnosis present

## 2023-02-20 DIAGNOSIS — E1159 Type 2 diabetes mellitus with other circulatory complications: Secondary | ICD-10-CM | POA: Diagnosis present

## 2023-02-20 DIAGNOSIS — Z833 Family history of diabetes mellitus: Secondary | ICD-10-CM | POA: Diagnosis not present

## 2023-02-20 DIAGNOSIS — R7989 Other specified abnormal findings of blood chemistry: Secondary | ICD-10-CM | POA: Diagnosis present

## 2023-02-20 DIAGNOSIS — R809 Proteinuria, unspecified: Secondary | ICD-10-CM | POA: Diagnosis present

## 2023-02-20 DIAGNOSIS — Z8249 Family history of ischemic heart disease and other diseases of the circulatory system: Secondary | ICD-10-CM

## 2023-02-20 LAB — PROCALCITONIN: Procalcitonin: <0.1 ng/mL

## 2023-02-20 LAB — COMPREHENSIVE METABOLIC PANEL
ALT: 19 U/L (ref 0–44)
AST: 111 U/L — ABNORMAL HIGH (ref 15–41)
Albumin: 3.8 g/dL (ref 3.5–5.0)
Alkaline Phosphatase: 94 U/L (ref 38–126)
Anion gap: 27 — ABNORMAL HIGH (ref 5–15)
BUN: 8 mg/dL (ref 8–23)
CO2: 11 mmol/L — ABNORMAL LOW (ref 22–32)
Calcium: 7.9 mg/dL — ABNORMAL LOW (ref 8.9–10.3)
Chloride: 102 mmol/L (ref 98–111)
Creatinine, Ser: 0.97 mg/dL (ref 0.61–1.24)
GFR, Estimated: >60 mL/min (ref >60)
Glucose, Bld: 119 mg/dL — ABNORMAL HIGH (ref 70–99)
Potassium: 4 mmol/L (ref 3.5–5.1)
Sodium: 140 mmol/L (ref 135–145)
Total Bilirubin: 1.2 mg/dL (ref 0.0–1.2)
Total Protein: 7.4 g/dL (ref 6.5–8.1)

## 2023-02-20 LAB — CBC
HCT: 29.2 % — ABNORMAL LOW (ref 39.0–52.0)
Hemoglobin: 9.1 g/dL — ABNORMAL LOW (ref 13.0–17.0)
MCH: 30.7 pg (ref 26.0–34.0)
MCHC: 31.2 g/dL (ref 30.0–36.0)
MCV: 98.6 fL (ref 80.0–100.0)
Platelets: 145 10*3/uL — ABNORMAL LOW (ref 150–400)
RBC: 2.96 MIL/uL — ABNORMAL LOW (ref 4.22–5.81)
RDW: 17.8 % — ABNORMAL HIGH (ref 11.5–15.5)
WBC: 4.9 10*3/uL (ref 4.0–10.5)
nRBC: 1 % — ABNORMAL HIGH (ref 0.0–0.2)

## 2023-02-20 LAB — CBG MONITORING, ED
Glucose-Capillary: 110 mg/dL — ABNORMAL HIGH (ref 70–99)
Glucose-Capillary: 98 mg/dL (ref 70–99)
Glucose-Capillary: 78 mg/dL (ref 70–99)

## 2023-02-20 LAB — DIFFERENTIAL
Abs Immature Granulocytes: 0.06 10*3/uL (ref 0.00–0.07)
Basophils Absolute: 0.1 10*3/uL (ref 0.0–0.1)
Basophils Relative: 1 %
Eosinophils Absolute: 0 10*3/uL (ref 0.0–0.5)
Eosinophils Relative: 0 %
Immature Granulocytes: 1 %
Lymphocytes Relative: 25 %
Lymphs Abs: 1.2 10*3/uL (ref 0.7–4.0)
Monocytes Absolute: 0.6 10*3/uL (ref 0.1–1.0)
Monocytes Relative: 12 %
Neutro Abs: 3 10*3/uL (ref 1.7–7.7)
Neutrophils Relative %: 61 %

## 2023-02-20 LAB — PROTIME-INR
INR: 1.2 (ref 0.8–1.2)
Prothrombin Time: 15 s (ref 11.4–15.2)

## 2023-02-20 LAB — PHOSPHORUS: Phosphorus: 4.3 mg/dL (ref 2.5–4.6)

## 2023-02-20 LAB — ETHANOL: Alcohol, Ethyl (B): 25 mg/dL — ABNORMAL HIGH (ref <10)

## 2023-02-20 LAB — MAGNESIUM: Magnesium: 1.1 mg/dL — ABNORMAL LOW (ref 1.7–2.4)

## 2023-02-20 LAB — APTT: aPTT: 28 s (ref 24–36)

## 2023-02-20 MED ORDER — MAGNESIUM SULFATE 2 GM/50ML IV SOLN
2.0000 g | Freq: Once | INTRAVENOUS | Status: AC
  Administered 2023-02-20: 2 g via INTRAVENOUS
  Filled 2023-02-20: qty 50

## 2023-02-20 MED ORDER — HEPARIN SODIUM (PORCINE) 5000 UNIT/ML IJ SOLN: 5000.0000 [IU] | Freq: Three times a day (TID) | INTRAMUSCULAR | Status: DC

## 2023-02-20 MED ORDER — LORAZEPAM 2 MG/ML IJ SOLN
1.0000 mg | INTRAMUSCULAR | Status: DC | PRN
  Administered 2023-02-20: 2 mg via INTRAVENOUS
  Administered 2023-02-20 (×2): 1 mg via INTRAVENOUS
  Filled 2023-02-20 (×3): qty 1

## 2023-02-20 MED ORDER — SODIUM CHLORIDE 0.9% FLUSH
3.0000 mL | Freq: Once | INTRAVENOUS | Status: AC
  Administered 2023-02-20: 3 mL via INTRAVENOUS

## 2023-02-20 MED ORDER — INSULIN ASPART 100 UNIT/ML IJ SOLN: 0.0000 [IU] | Freq: Every day | INTRAMUSCULAR | Status: DC

## 2023-02-20 MED ORDER — STROKE: EARLY STAGES OF RECOVERY BOOK: Freq: Once | Status: AC

## 2023-02-20 MED ORDER — SODIUM BICARBONATE 8.4 % IV SOLN
50.0000 meq | Freq: Once | INTRAVENOUS | Status: AC
  Administered 2023-02-20: 50 meq via INTRAVENOUS
  Filled 2023-02-20: qty 50

## 2023-02-20 MED ORDER — ACETAMINOPHEN 160 MG/5ML PO SOLN: 650.0000 mg | ORAL | Status: DC | PRN

## 2023-02-20 MED ORDER — ADULT MULTIVITAMIN W/MINERALS CH
1.0000 | ORAL_TABLET | Freq: Every day | ORAL | Status: DC
  Administered 2023-02-20 – 2023-02-22 (×3): 1 via ORAL
  Filled 2023-02-20 (×3): qty 1

## 2023-02-20 MED ORDER — THIAMINE MONONITRATE 100 MG PO TABS
100.0000 mg | ORAL_TABLET | Freq: Every day | ORAL | Status: DC
Start: 2023-02-20 — End: 2023-02-22
  Administered 2023-02-20 – 2023-02-22 (×3): 100 mg via ORAL
  Filled 2023-02-20 (×3): qty 1

## 2023-02-20 MED ORDER — IOHEXOL 350 MG/ML SOLN
100.0000 mL | Freq: Once | INTRAVENOUS | Status: AC | PRN
  Administered 2023-02-20: 100 mL via INTRAVENOUS

## 2023-02-20 MED ORDER — ACETAMINOPHEN 650 MG RE SUPP: 650.0000 mg | RECTAL | Status: DC | PRN

## 2023-02-20 MED ORDER — INSULIN ASPART 100 UNIT/ML IJ SOLN: 0.0000 [IU] | Freq: Three times a day (TID) | INTRAMUSCULAR | Status: DC

## 2023-02-20 MED ORDER — THIAMINE HCL 100 MG/ML IJ SOLN
100.0000 mg | Freq: Every day | INTRAMUSCULAR | Status: DC
Start: 2023-02-20 — End: 2023-02-22

## 2023-02-20 MED ORDER — SENNOSIDES-DOCUSATE SODIUM 8.6-50 MG PO TABS: 1.0000 | ORAL_TABLET | Freq: Every evening | ORAL | Status: DC | PRN

## 2023-02-20 MED ORDER — ACETAMINOPHEN 325 MG PO TABS: 650.0000 mg | ORAL_TABLET | ORAL | Status: DC | PRN

## 2023-02-20 MED ORDER — FOLIC ACID 1 MG PO TABS
1.0000 mg | ORAL_TABLET | Freq: Every day | ORAL | Status: DC
Start: 2023-02-20 — End: 2023-02-22
  Administered 2023-02-20 – 2023-02-22 (×3): 1 mg via ORAL
  Filled 2023-02-20 (×3): qty 1

## 2023-02-20 MED ORDER — LORAZEPAM 1 MG PO TABS
1.0000 mg | ORAL_TABLET | ORAL | Status: DC | PRN
Start: 2023-02-20 — End: 2023-02-23

## 2023-02-20 MED ORDER — HYDRALAZINE HCL 20 MG/ML IJ SOLN: 5.0000 mg | INTRAMUSCULAR | Status: AC | PRN

## 2023-02-20 NOTE — Procedures (Signed)
Patient Name: George Gomez  MRN: [id number]  Epilepsy Attending: Charlsie Quest  Referring Physician/Provider: Milon Dikes, MD  Date: 02/20/2023 Duration: 28.52 mins  Patient history:  63 y.o. male past medical history as above brought in for concern for strokelike symptoms when he was noted to be walking into a friend's house with gait difficulty, right-sided weakness and right facial weakness. The facial weakness and symptoms of right-sided weakness rapidly improved but he still remains confused as to where he is, how old he is etc. EEG to evaluate for seizure  Level of alertness: Awake  AEDs during EEG study: None  Technical aspects: This EEG study was done with scalp electrodes positioned according to the 10-20 International system of electrode placement. Electrical activity was reviewed with band pass filter of 1-70Hz , sensitivity of 7 uV/mm, display speed of 57mm/sec with a 60Hz  notched filter applied as appropriate. EEG data were recorded continuously and digitally stored.  Video monitoring was available and reviewed as appropriate.  Description: The posterior dominant rhythm consists of 8-9 Hz activity of moderate voltage (25-35 uV) seen predominantly in posterior head regions, symmetric and reactive to eye opening and eye closing. Hyperventilation and photic stimulation were not performed.     Study was technically difficult due to significant myogenic artifact.  IMPRESSION: This technically difficult study is within normal limits. No seizures or epileptiform discharges were seen throughout the recording.  A normal interictal EEG does not exclude the diagnosis of epilepsy.   George Gomez

## 2023-02-20 NOTE — Assessment & Plan Note (Addendum)
With withdrawal on admission CIWA precaution, discussed with nursing at bedside to get patient Ativan IV Continue CIWA precaution, close monitoring Attempted to counsel at bedside however I do not think that patient is ready to quit and he is actively tremulous

## 2023-02-20 NOTE — Progress Notes (Signed)
   02/20/23 1100  Spiritual Encounters  Type of Visit Initial  Care provided to: Patient  Conversation partners present during encounter Nurse  Referral source Code page  Reason for visit Code  OnCall Visit Yes  Spiritual Framework  Presenting Themes Impactful experiences and emotions  Patient Stress Factors None identified  Family Stress Factors None identified  Intervention Outcomes  Outcomes Reduced anxiety  Spiritual Care Plan  Spiritual Care Issues Still Outstanding Referring to oncoming chaplain for further support   Chaplain visited patient after receiving a Code Stroke page.  Chaplain stood by patient's bedside and offered a compassionate presence while waiting for family to arrive.   Chaplain left and referred to colleague to return to see if family arrived.     Rev. Rana M. Earlene Plater, MDiv. Chaplain Resident [facility]

## 2023-02-20 NOTE — Assessment & Plan Note (Signed)
Home metformin 500 mg daily will not be resumed on admission Insulin SSI with HS coverage ordered Goal inpatient blood glucose level is 140-180

## 2023-02-20 NOTE — Progress Notes (Signed)
Eeg done

## 2023-02-20 NOTE — Assessment & Plan Note (Signed)
Secondary to chronic alcohol abuse

## 2023-02-20 NOTE — Assessment & Plan Note (Signed)
Neurology has been consulted and we appreciate further recommendations Complete echo MRI of the brain Fasting lipid and A1c ordered Permissive hypertension: Maintain SpO2 < 220 Hydralazine 5 mg IV every 4 hours as needed for SBP greater than 220, 1 day ordered AM team to change as needed antihypertensive medication when appropriate Frequent neuro vascular checks N.p.o. pending swallow study PT, OT, SLP Fall precaution and aspiration precaution

## 2023-02-20 NOTE — Hospital Course (Addendum)
George Gomez is a 63 year old male with history of alcohol use disorder, alcohol dependence, hypertension, history of CVA in the left lacunar, presents emergency department for chief concerns of right-sided facial droop.  Vitals in the ED showed temperature 97.7, respiration rate 21, heart rate of 110, blood pressure 144/99, SpO2 98% on room air.  Serum sodium is 140, potassium 4.0, chloride 102, bicarb 11, BUN of 8, serum creatinine of 0.97, EGFR greater than 60, nonfasting blood glucose 119, WBC 4.9, hemoglobin 9.1, platelets of 145.  Code stroke was called upon patient arrival to the ED.  Neurology has been consulted.  ED treatment: None.

## 2023-02-20 NOTE — Progress Notes (Signed)
CODE STROKE- PHARMACY COMMUNICATION  Time CODE STROKE called/page received: 1050 AM 1/24  Time response to CODE STROKE was made (in person or via phone):   Time Stroke Kit retrieved from Pyxis (only if needed):  TNK not indicated for this patient   Name of Provider/Nurse contacted: Milon Dikes  Past Medical History:  Diagnosis Date   Acute on chronic renal failure (HCC) 03/20/2016   Acute renal failure (ARF) (HCC) 08/28/2016   Acute renal failure (HCC) 10/12/2018   Anemia    B12 deficiency 12/11/2017   Clostridium difficile diarrhea 03/18/2016   Diabetes mellitus without complication (HCC)    Diverticulitis    Pt states diverticulitis   EtOH dependence (HCC)    Folate deficiency 07/25/2018   GI bleed 05/30/2017   Hypercholesteremia    Hypertension    Melena 10/21/2018   Weight loss 09/15/2018   Prior to Admission medications   Medication Sig Start Date End Date Taking? Authorizing Provider  allopurinol (ZYLOPRIM) 300 MG tablet Take 1 tablet (300 mg total) by mouth daily. Patient not taking: Reported on 02/17/2023 01/23/23   Aura Dials T, NP  atorvastatin (LIPITOR) 40 MG tablet Take 1 tablet (40 mg total) by mouth daily. Patient not taking: Reported on 02/17/2023 01/23/23   Aura Dials T, NP  Blood Glucose Monitoring Suppl (ONETOUCH VERIO) w/Device KIT Use to check blood sugar 3 times a day and document results, bring to appointments.  Goal is <130 fasting blood sugar and <180 two hours after meals. Patient not taking: Reported on 02/17/2023 07/24/22   Aura Dials T, NP  Cholecalciferol 50 MCG (2000 UT) CAPS Take 1 capsule by mouth daily. Patient not taking: Reported on 02/17/2023    [provider]  cyanocobalamin (VITAMIN B12) 1000 MCG tablet Take 1 tablet (1,000 mcg total) by mouth daily. Patient not taking: Reported on 02/17/2023 01/23/23   Aura Dials T, NP  gabapentin (NEURONTIN) 300 MG capsule Take one tablet (300 MG) by mouth in the morning, then take one  tablet (300 MG) by mouth at noon, and then take 600 MG (two tablets) by mouth before bedtime. Patient taking differently: Take 1 capsule by mouth in the morning, 1 capsule at noon, and 2 capsules at bedtime as needed for neuropathy 04/23/22   Cannady, Jolene T, NP  glucose blood (ONETOUCH VERIO) test strip Use to check blood sugar 3 times a day and document results, bring to appointments.  Goal is <130 fasting blood sugar and <180 two hours after meals. Patient not taking: Reported on 02/17/2023 07/24/22   Aura Dials T, NP  Lancets [facility] ULTRASOFT) lancets Use to check blood sugar 3 times a day and document results, bring to appointments.  Goal is <130 fasting blood sugar and <180 two hours after meals. Patient not taking: Reported on 02/17/2023 07/24/22   Aura Dials T, NP  magnesium oxide (MAG-OX) 400 (240 Mg) MG tablet Take 1 tablet (400 mg total) by mouth 2 (two) times daily. Patient not taking: Reported on 02/17/2023 01/24/23   Aura Dials T, NP  metFORMIN (GLUCOPHAGE-XR) 500 MG 24 hr tablet Take 1 tablet (500 mg total) by mouth daily. Patient not taking: Reported on 02/17/2023 07/25/22   Aura Dials T, NP  ondansetron (ZOFRAN) 4 MG tablet Take 1 tablet (4 mg total) by mouth every 8 (eight) hours as needed. Patient not taking: Reported on 02/17/2023 12/09/22 12/09/23  Janith Lima, MD  pantoprazole (PROTONIX) 40 MG tablet Take 1 tablet (40 mg total) by mouth daily.  Patient not taking: Reported on 02/17/2023 04/23/22   Aura Dials T, NP  potassium chloride (KLOR-CON M) 10 MEQ tablet Take 2 tablets (20 mEq total) by mouth daily for 5 days. Patient not taking: Reported on 02/17/2023 02/10/23 02/15/23  Aura Dials T, NP  propranolol ER (INDERAL LA) 60 MG 24 hr capsule Take 1 capsule (60 mg total) by mouth daily. Patient not taking: Reported on 02/17/2023 01/23/23   Aura Dials T, NP  tiZANidine (ZANAFLEX) 2 MG tablet TAKE 1 TABLET(2 MG) BY MOUTH EVERY 8 HOURS AS NEEDED FOR  MUSCLE SPASMS Patient not taking: Reported on 02/17/2023 06/25/22   Aura Dials T, NP  triamcinolone cream (KENALOG) 0.1 % Apply 1 application topically 2 (two) times daily. Patient not taking: Reported on 02/17/2023 01/14/21   Rodman Pickle A, NP  VOLTAREN 1 % GEL Apply 4 gram to affected area four times a day as needed for pain for knee pain. Do not exceed 16gm for LE joint per day Patient not taking: Reported on 02/17/2023 03/04/20   [provider]   Effie Shy, PharmD Pharmacy Resident  02/20/2023 1:36 PM

## 2023-02-20 NOTE — ED Triage Notes (Signed)
EMS called, patient noted to have slurred speech, AMS, difficulty walking. Code STroke activated by EMS.  Neurology at bedside to assess patient upon arrival to ED.

## 2023-02-20 NOTE — Progress Notes (Signed)
   02/20/23 1345  Spiritual Encounters  Type of Visit Follow up  Care provided to: Patient  Referral source Chaplain team  Reason for visit Routine spiritual support  OnCall Visit No  Spiritual Framework  Presenting Themes Impactful experiences and emotions  Interventions  Spiritual Care Interventions Made Established relationship of care and support;Compassionate presence

## 2023-02-20 NOTE — H&P (Addendum)
History and Physical   George Gomez CZY:[id number] DOB: Jan 01, 1961 DOA: 02/20/2023  PCP: Marjie Skiff, NP  Outpatient Specialists: Dr. [facility] gastro[facility] Patient coming from: Home via EMS  I have personally briefly reviewed patient's old medical records in [facility] EMR.  Chief Concern: Right-sided facial droop  HPI: [name] is a 63 year old male with history of alcohol use disorder, alcohol dependence, hypertension, history of CVA in the left lacunar, presents emergency department for chief concerns of right-sided facial droop.  Vitals in the ED showed temperature 97.7, respiration rate 21, heart rate of 110, blood pressure 144/99, SpO2 98% on room air.  Serum sodium is 140, potassium 4.0, chloride 102, bicarb 11, BUN of 8, serum creatinine of 0.97, EGFR greater than 60, nonfasting blood glucose 119, WBC 4.9, hemoglobin 9.1, platelets of 145.  Code stroke was called upon patient arrival to the ED.  Neurology has been consulted.  ED treatment: None. ------------------------------- At bedside, patient able to tell me his first and last name, age, location, current calendar year.  He first told me it was 2024 and then corrected and states is 23.  He knows he is in the hospital.  He reports his last drink was 1 cup of vodka yesterday on 02/19/2023.  He did not have any vodka on 02/18/2023 due to not feeling well.  Patient reports he normally drinks about 1 cup of vodka per day, about 8 to 10 ounces.  He sometimes makes with cranberry juice.  He denies trauma to his person.  He denies nausea, vomiting, dysuria, hematuria.  He reports he has been having diarrhea for the last 2 days, 2 episodes each day, brown watery stool.  He denies recent antibiotic use.  Social history: He states he lives at home on his own.  He denies recreational drug use.  He drinks alcohol almost daily except for the last few days because he has not been feeling  well.  ROS: Constitutional: no weight change, no fever ENT/Mouth: no sore throat, no rhinorrhea Eyes: no eye pain, no vision changes Cardiovascular: no chest pain, no dyspnea,  no edema, no palpitations Respiratory: no cough, no sputum, no wheezing Gastrointestinal: no nausea, no vomiting, + diarrhea, no constipation Genitourinary: no urinary incontinence, no dysuria, no hematuria Musculoskeletal: no arthralgias, no myalgias Skin: no skin lesions, no pruritus, Neuro: + weakness, no loss of consciousness, no syncope Psych: no anxiety, no depression, + decrease appetite Heme/Lymph: no bruising, no bleeding  ED Course: Discussed with EDP, patient requiring hospitalization for chief concerns of strokelike symptoms.  Assessment/Plan  Principal Problem:   Stroke-like symptom Active Problems:   Alcohol abuse   Thrombocytopenia (HCC)   Elevated LFTs   Hyperlipidemia due to type 2 diabetes mellitus (HCC)   Hypertension associated with type 2 diabetes mellitus (HCC)   Gastroesophageal reflux disease   Type 2 diabetes mellitus with diabetic neuropathy (HCC)   Type 2 diabetes mellitus with proteinuria (HCC)   Hypomagnesemia   Hepatic steatosis   Seizure due to alcohol withdrawal (HCC)   Assessment and Plan:  * Stroke-like symptom Neurology has been consulted and we appreciate further recommendations Complete echo MRI of the brain Fasting lipid and A1c ordered Permissive hypertension: Maintain SpO2 < 220 Hydralazine 5 mg IV every 4 hours as needed for SBP greater than 220, 1 day ordered AM team to change as needed antihypertensive medication when appropriate Frequent neuro vascular checks N.p.o. pending swallow study PT, OT, SLP Fall precaution and aspiration precaution  Seizure due to alcohol withdrawal (HCC) Seizure precaution, CIWA protocol Nursing aware  Hypomagnesemia Serum magnesium was 1.1 on admission, replaced with magnesium sulfate 2 g IV bolus on admission Check  magnesium level in the a.m.  Type 2 diabetes mellitus with proteinuria (HCC) Home metformin 500 mg daily will not be resumed on admission Insulin SSI with HS coverage ordered Goal inpatient blood glucose level is 140-180  Type 2 diabetes mellitus with diabetic neuropathy (HCC) Insulin SSI with at bedtime coverage ordered  Hypertension associated with type 2 diabetes mellitus (HCC) Hydralazine 5 mg IV every 4 hours as needed for SBP greater than 220, 1 day ordered  Elevated LFTs Secondary to alcohol abuse  Thrombocytopenia (HCC) Secondary to chronic alcohol abuse  Alcohol abuse With withdrawal on admission CIWA precaution, discussed with nursing at bedside to get patient Ativan IV Continue CIWA precaution, close monitoring Attempted to counsel at bedside however I do not think that patient is ready to quit and he is actively tremulous  Chart reviewed.   DVT prophylaxis: TED hose; AM team to initiate pharmacologic DVT prophylaxis when the benefits outweigh the risk Code Status: Full code Diet: N.p.o. with orders to advance as tolerated to clear liquid diet Family Communication: A phone call was offered, patient declined stating he ready updated his family. Disposition Plan: Pending clinical course, neurology recommendation and EEG Consults called: Neurology Admission status: Telemetry medical, observation initially due to code stroke however on my evaluation, I believe the patient has alcohol withdrawal.  Changed to PCU, inpatient   Past Medical History:  Diagnosis Date   Acute on chronic renal failure (HCC) 03/20/2016   Acute renal failure (ARF) (HCC) 08/28/2016   Acute renal failure (HCC) 10/12/2018   Anemia    B12 deficiency 12/11/2017   Clostridium difficile diarrhea 03/18/2016   Diabetes mellitus without complication (HCC)    Diverticulitis    Pt states diverticulitis   EtOH dependence (HCC)    Folate deficiency 07/25/2018   GI bleed 05/30/2017   Hypercholesteremia     Hypertension    Melena 10/21/2018   Weight loss 09/15/2018   Past Surgical History:  Procedure Laterality Date   COLONOSCOPY WITH PROPOFOL N/A 10/16/2018   Procedure: COLONOSCOPY WITH PROPOFOL;  Surgeon: Toney Reil, MD;  Location: [facility] ENDOSCOPY;  Service: Gastroenterology;  Laterality: N/A;   ENTEROSCOPY N/A 10/16/2018   Procedure: ENTEROSCOPY;  Surgeon: Toney Reil, MD;  Location: [facility] ENDOSCOPY;  Service: Gastroenterology;  Laterality: N/A;   ESOPHAGOGASTRODUODENOSCOPY N/A 07/02/2018   Procedure: ESOPHAGOGASTRODUODENOSCOPY (EGD);  Surgeon: Toney Reil, MD;  Location: [facility] ENDOSCOPY;  Service: Gastroenterology;  Laterality: N/A;   ESOPHAGOGASTRODUODENOSCOPY (EGD) WITH PROPOFOL N/A 06/01/2017   Procedure: ESOPHAGOGASTRODUODENOSCOPY (EGD) WITH PROPOFOL;  Surgeon: Wyline Mood, MD;  Location: [facility] ENDOSCOPY;  Service: Gastroenterology;  Laterality: N/A;   GIVENS CAPSULE STUDY N/A 10/17/2018   Procedure: GIVENS CAPSULE STUDY;  Surgeon: Toney Reil, MD;  Location: [facility] ENDOSCOPY;  Service: Gastroenterology;  Laterality: N/A;   GIVENS CAPSULE STUDY N/A 08/02/2019   Procedure: GIVENS CAPSULE STUDY;  Surgeon: Toney Reil, MD;  Location: [facility] ENDOSCOPY;  Service: Gastroenterology;  Laterality: N/A;   none     Social History:  reports that he has never smoked. He has never used smokeless tobacco. He reports that he does not currently use alcohol after a past usage of about 3.0 standard drinks of alcohol per week. He reports that he does not use drugs.  Allergies  Allergen Reactions   Lisinopril Swelling  Losartan Swelling   Family History  Problem Relation Age of Onset   Rheum arthritis Mother    Diabetes Father    Heart disease Father    Cancer Father    COPD Sister    Rheum arthritis Sister    Diabetes Maternal Grandmother    Cancer Maternal Grandfather    Diabetes Paternal Grandmother    Cancer Paternal Grandfather    Family history: Family history  reviewed and not pertinent.  Prior to Admission medications   Medication Sig Start Date End Date Taking? Authorizing Provider  allopurinol (ZYLOPRIM) 300 MG tablet Take 1 tablet (300 mg total) by mouth daily. Patient not taking: Reported on 02/17/2023 01/23/23   Aura Dials T, NP  atorvastatin (LIPITOR) 40 MG tablet Take 1 tablet (40 mg total) by mouth daily. Patient not taking: Reported on 02/17/2023 01/23/23   Aura Dials T, NP  Blood Glucose Monitoring Suppl (ONETOUCH VERIO) w/Device KIT Use to check blood sugar 3 times a day and document results, bring to appointments.  Goal is <130 fasting blood sugar and <180 two hours after meals. Patient not taking: Reported on 02/17/2023 07/24/22   Aura Dials T, NP  Cholecalciferol 50 MCG (2000 UT) CAPS Take 1 capsule by mouth daily. Patient not taking: Reported on 02/17/2023    [provider]  cyanocobalamin (VITAMIN B12) 1000 MCG tablet Take 1 tablet (1,000 mcg total) by mouth daily. Patient not taking: Reported on 02/17/2023 01/23/23   Aura Dials T, NP  gabapentin (NEURONTIN) 300 MG capsule Take one tablet (300 MG) by mouth in the morning, then take one tablet (300 MG) by mouth at noon, and then take 600 MG (two tablets) by mouth before bedtime. Patient taking differently: Take 1 capsule by mouth in the morning, 1 capsule at noon, and 2 capsules at bedtime as needed for neuropathy 04/23/22   Cannady, Jolene T, NP  glucose blood (ONETOUCH VERIO) test strip Use to check blood sugar 3 times a day and document results, bring to appointments.  Goal is <130 fasting blood sugar and <180 two hours after meals. Patient not taking: Reported on 02/17/2023 07/24/22   Aura Dials T, NP  Lancets [facility] ULTRASOFT) lancets Use to check blood sugar 3 times a day and document results, bring to appointments.  Goal is <130 fasting blood sugar and <180 two hours after meals. Patient not taking: Reported on 02/17/2023 07/24/22   Aura Dials T, NP   magnesium oxide (MAG-OX) 400 (240 Mg) MG tablet Take 1 tablet (400 mg total) by mouth 2 (two) times daily. Patient not taking: Reported on 02/17/2023 01/24/23   Aura Dials T, NP  metFORMIN (GLUCOPHAGE-XR) 500 MG 24 hr tablet Take 1 tablet (500 mg total) by mouth daily. Patient not taking: Reported on 02/17/2023 07/25/22   Aura Dials T, NP  ondansetron (ZOFRAN) 4 MG tablet Take 1 tablet (4 mg total) by mouth every 8 (eight) hours as needed. Patient not taking: Reported on 02/17/2023 12/09/22 12/09/23  Janith Lima, MD  pantoprazole (PROTONIX) 40 MG tablet Take 1 tablet (40 mg total) by mouth daily. Patient not taking: Reported on 02/17/2023 04/23/22   Aura Dials T, NP  potassium chloride (KLOR-CON M) 10 MEQ tablet Take 2 tablets (20 mEq total) by mouth daily for 5 days. Patient not taking: Reported on 02/17/2023 02/10/23 02/15/23  Aura Dials T, NP  propranolol ER (INDERAL LA) 60 MG 24 hr capsule Take 1 capsule (60 mg total) by mouth daily. Patient not taking: Reported  on 02/17/2023 01/23/23   Aura Dials T, NP  tiZANidine (ZANAFLEX) 2 MG tablet TAKE 1 TABLET(2 MG) BY MOUTH EVERY 8 HOURS AS NEEDED FOR MUSCLE SPASMS Patient not taking: Reported on 02/17/2023 06/25/22   Aura Dials T, NP  triamcinolone cream (KENALOG) 0.1 % Apply 1 application topically 2 (two) times daily. Patient not taking: Reported on 02/17/2023 01/14/21   Rodman Pickle A, NP  VOLTAREN 1 % GEL Apply 4 gram to affected area four times a day as needed for pain for knee pain. Do not exceed 16gm for LE joint per day Patient not taking: Reported on 02/17/2023 03/04/20   [provider]   Physical Exam: Vitals:   02/20/23 1141 02/20/23 1145 02/20/23 1230 02/20/23 1800  BP: (!) 144/99  (!) 136/92 (!) 147/94  Pulse: (!) 105 (!) 110 95 81  Resp: 18 (!) 21 20 18   Temp: 97.7 F (36.5 C)     SpO2: 100% 99% 100% 97%   Constitutional: appears age-appropriate, frail, acutely tremulous, poor hygiene, patient  urinated on himself Eyes: PERRL, lids and conjunctivae normal ENMT: Mucous membranes are moist. Posterior pharynx clear of any exudate or lesions. Age-appropriate dentition. Hearing appropriate Neck: normal, supple, no masses, no thyromegaly Respiratory: clear to auscultation bilaterally, no wheezing, no crackles. Normal respiratory effort. No accessory muscle use.  Cardiovascular: Regular rate and rhythm, no murmurs / rubs / gallops. No extremity edema. 2+ pedal pulses. No carotid bruits.  Abdomen: no tenderness, no masses palpated, no hepatosplenomegaly. Bowel sounds positive.  Musculoskeletal: no clubbing / cyanosis. No joint deformity upper and lower extremities. Good ROM, no contractures, no atrophy. Normal muscle tone.  Skin: no rashes, lesions, ulcers. No induration Neurologic: Sensation intact. Strength 5/5 in all 4.  Psychiatric: Normal judgment and insight. Alert and oriented x 3. Normal mood.   EKG: independently reviewed, showing sinus tachycardia with rate of 107, QTc 493  Chest x-ray on Admission: I personally reviewed and I agree with radiologist reading as below.  EEG adult Result Date: 02/20/2023 Charlsie Quest, MD     02/20/2023  5:26 PM Patient Name: George Gomez MRN: [id number] Epilepsy Attending: Charlsie Quest Referring Physician/Provider: Milon Dikes, MD Date: 02/20/2023 Duration: 28.52 mins Patient history:  63 y.o. male past medical history as above brought in for concern for strokelike symptoms when he was noted to be walking into a friend's house with gait difficulty, right-sided weakness and right facial weakness. The facial weakness and symptoms of right-sided weakness rapidly improved but he still remains confused as to where he is, how old he is etc. EEG to evaluate for seizure Level of alertness: Awake AEDs during EEG study: None Technical aspects: This EEG study was done with scalp electrodes positioned according to the 10-20 International system of electrode  placement. Electrical activity was reviewed with band pass filter of 1-70Hz , sensitivity of 7 uV/mm, display speed of 92mm/sec with a 60Hz  notched filter applied as appropriate. EEG data were recorded continuously and digitally stored.  Video monitoring was available and reviewed as appropriate. Description: The posterior dominant rhythm consists of 8-9 Hz activity of moderate voltage (25-35 uV) seen predominantly in posterior head regions, symmetric and reactive to eye opening and eye closing. Hyperventilation and photic stimulation were not performed.   Study was technically difficult due to significant myogenic artifact. IMPRESSION: This technically difficult study is within normal limits. No seizures or epileptiform discharges were seen throughout the recording. A normal interictal EEG does not exclude the diagnosis of epilepsy. Priyanka  Annabelle Harman   MR BRAIN WO CONTRAST Result Date: 02/20/2023 CLINICAL DATA:  Stroke suspected, unsteady gait and slurred speech EXAM: MRI HEAD WITHOUT CONTRAST TECHNIQUE: Multiplanar, multiecho pulse sequences of the brain and surrounding structures were obtained without intravenous contrast. COMPARISON:  No prior MRI available, correlation is made with 02/20/2023 CT head FINDINGS: Brain: No restricted diffusion to suggest acute or subacute infarct. No acute hemorrhage, mass, mass effect, or midline shift. No hydrocephalus or extra-axial collection. Pituitary and craniocervical junction within normal limits. No hemosiderin deposition to suggest remote hemorrhage. Confluent T2 hyperintense signal in the periventricular white matter, likely the sequela of moderate to severe chronic small vessel ischemic disease for age. Remote lacunar infarct in the posterior left lentiform nucleus. No evidence of remote cortical infarct. Mildly advanced cerebral atrophy for age. Vascular: Normal arterial flow voids. Skull and upper cervical spine: Normal marrow signal. Sinuses/Orbits: Mucosal  thickening in the maxillary sinuses and ethmoid air cells. No acute finding in the orbits. Other: The mastoid air cells are well aerated. IMPRESSION: No acute intracranial process. No evidence of acute or subacute infarct. Electronically Signed   By: Wiliam Ke M.D.   On: 02/20/2023 14:54   CT ANGIO HEAD NECK W WO CM W PERF (CODE STROKE) Result Date: 02/20/2023 CLINICAL DATA:  Provided history: Neuro deficit, acute, stroke suspected. Additional history provided: Right-sided weakness. EXAM: CT ANGIOGRAPHY HEAD AND NECK CT PERFUSION HEAD TECHNIQUE: Multidetector CT imaging of the head and neck was performed using the standard protocol during bolus administration of intravenous contrast. Multiplanar CT image reconstructions and MIPs were obtained to evaluate the vascular anatomy. Carotid stenosis measurements (when applicable) are obtained utilizing NASCET criteria, using the distal internal carotid diameter as the denominator. Multiphase CT imaging of the brain was performed following IV bolus contrast injection. Subsequent parametric perfusion maps were calculated using RAPID software. RADIATION DOSE REDUCTION: This exam was performed according to the departmental dose-optimization program which includes automated exposure control, adjustment of the mA and/or kV according to patient size and/or use of iterative reconstruction technique. CONTRAST:  OMNIPAQUE IOHEXOL 350 MG/ML SOLN COMPARISON:  Non-contrast head CT 02/20/2023. FINDINGS: CTA NECK FINDINGS Aortic arch: Standard aortic branching. Atherosclerotic plaque within the visualized thoracic aorta and proximal major branch vessels of the neck. No hemodynamically significant innominate or proximal subclavian artery stenosis. Right carotid system: CCA and ICA patent within the neck. Calcified atherosclerotic plaque within the proximal ICA resulting in less than 50% stenosis. Left carotid system: CCA and ICA patent within the neck without stenosis or  significant atherosclerotic disease. Vertebral arteries: Patent within the neck without stenosis or significant atherosclerotic disease. The left vertebral artery is slightly dominant. Skeleton: Spondylosis at the cervical and visualized upper thoracic levels. No acute fracture or aggressive osseous lesion. Other neck: No neck mass or cervical lymphadenopathy. Upper chest: No consolidation within the imaged lung apices. Review of the MIP images confirms the above findings CTA HEAD FINDINGS Anterior circulation: The intracranial internal carotid arteries are patent. Nonstenotic atherosclerotic plaque within both vessels The M1 middle cerebral arteries are patent. No M2 proximal branch occlusion or high-grade proximal stenosis. The anterior cerebral arteries are patent. No intracranial aneurysm is identified. Posterior circulation: The intracranial vertebral arteries are patent. The basilar artery is patent. The posterior cerebral arteries are patent. Posterior communicating arteries are present bilaterally. Venous sinuses: Limited assessment for dural venous sinus thrombosis given contrast timing. Anatomic variants: None significant. Review of the MIP images confirms the above findings CT Brain Perfusion Findings:  CBF (<30%) Volume: 0mL Perfusion (Tmax>6.0s) volume: 8mL Mismatch Volume: 0mL Infarction Location:None identified. No emergent large vessel occlusion identified. These results were called by telephone at the time of interpretation on 02/20/2023 at 11:20 am to provider [facility] , who verbally acknowledged these results. IMPRESSION: CTA neck: 1. The common carotid and internal carotid arteries are patent within the neck. Calcified plaque within the proximal right ICA resulting in less than 50% stenosis. 2. The vertebral arteries are patent within the neck without stenosis or significant atherosclerotic disease. 3. Aortic Atherosclerosis (ICD10-I70.0). CTA head: 1. No proximal intracranial large vessel  occlusion or high-grade proximal arterial stenosis identified. 2. Non-stenotic atherosclerotic plaque within the intracranial ICAs. CT perfusion head: The perfusion software identifies no core infarct. The perfusion software identifies an 8 mL region of hypoperfused parenchyma within the inferior cerebellum bilaterally (utilizing the Tmax>6 seconds threshold). Reported mismatch volume: 8 mL. Electronically Signed   By: Jackey Loge D.O.   On: 02/20/2023 11:49   CT HEAD CODE STROKE WO CONTRAST Result Date: 02/20/2023 CLINICAL DATA:  Code stroke. Provided history: Neuro deficit, acute, stroke suspected. EXAM: CT HEAD WITHOUT CONTRAST TECHNIQUE: Contiguous axial images were obtained from the base of the skull through the vertex without intravenous contrast. RADIATION DOSE REDUCTION: This exam was performed according to the departmental dose-optimization program which includes automated exposure control, adjustment of the mA and/or kV according to patient size and/or use of iterative reconstruction technique. COMPARISON:  Head CT 04/11/2019. FINDINGS: Brain: Generalized cerebral and cerebellar volume loss. Small lacunar infarct within the left thalamus, new from the prior head CT of 04/11/2019 but otherwise age-indeterminate (series 3, image 16). Chronic lacunar infarcts again noted within/about the bilateral basal ganglia. Background moderate patchy and ill-defined hypoattenuation within the cerebral white matter, nonspecific but compatible with chronic small vessel ischemic disease. There is no acute intracranial hemorrhage. No demarcated cortical infarct. No extra-axial fluid collection. No evidence of an intracranial mass. No midline shift. Vascular: No hyperdense vessel.  Atherosclerotic calcifications. Skull: No calvarial fracture or aggressive osseous lesion. Sinuses/Orbits: No mass or acute finding within the imaged orbits. Mild mucosal thickening within the right ethmoid and bilateral maxillary sinuses at  the imaged levels. ASPECTS ([place]berta Stroke Program Early CT Score) - Ganglionic level infarction (caudate, lentiform nuclei, internal capsule, insula, M1-M3 cortex): 7 - Supraganglionic infarction (M4-M6 cortex): 3 Total score (0-10 with 10 being normal): 10 These results were communicated to Dr. Wilford Corner at 11:25 amon 1/24/2025by text page via the [facility] messaging system. IMPRESSION: 1. Small lacunar infarct within the left thalamus, new from the prior head CT of 04/11/2019 but otherwise age-indeterminate. A brain MRI may be obtained for further evaluation, as clinically warranted. 2. No acute intracranial hemorrhage or acute demarcated cortical infarct. 3. Background parenchymal atrophy, chronic small vessel ischemic disease and chronic lacunar infarcts, as described. Electronically Signed   By: Jackey Loge D.O.   On: 02/20/2023 11:25   Labs on Admission: I have personally reviewed following labs  CBC: Recent Labs  Lab 02/20/23 1100  WBC 4.9  NEUTROABS 3.0  HGB 9.1*  HCT 29.2*  MCV 98.6  PLT 145*   Basic Metabolic Panel: Recent Labs  Lab 02/20/23 1100  NA 140  K 4.0  CL 102  CO2 11*  GLUCOSE 119*  BUN 8  CREATININE 0.97  CALCIUM 7.9*  MG 1.1*  PHOS 4.3   GFR: Estimated Creatinine Clearance: 81.9 mL/min (by C-G formula based on SCr of 0.97 mg/dL).  Liver Function Tests: Recent  Labs  Lab 02/20/23 1100  AST 111*  ALT 19  ALKPHOS 94  BILITOT 1.2  PROT 7.4  ALBUMIN 3.8   Coagulation Profile: Recent Labs  Lab 02/20/23 1100  INR 1.2   CBG: Recent Labs  Lab 02/20/23 1104  GLUCAP 110*   Urine analysis:    Component Value Date/Time   COLORURINE YELLOW (A) 12/09/2022 1900   APPEARANCEUR CLEAR (A) 12/09/2022 1900   APPEARANCEUR Clear 07/09/2021 0917   LABSPEC >1.046 (H) 12/09/2022 1900   LABSPEC 1.009 09/19/2013 2009   PHURINE 6.0 12/09/2022 1900   GLUCOSEU NEGATIVE 12/09/2022 1900   GLUCOSEU Negative 09/19/2013 2009   HGBUR NEGATIVE 12/09/2022 1900    BILIRUBINUR NEGATIVE 12/09/2022 1900   BILIRUBINUR Negative 07/09/2021 0917   BILIRUBINUR Negative 09/19/2013 2009   KETONESUR 20 (A) 12/09/2022 1900   PROTEINUR NEGATIVE 12/09/2022 1900   NITRITE NEGATIVE 12/09/2022 1900   LEUKOCYTESUR NEGATIVE 12/09/2022 1900   LEUKOCYTESUR Negative 09/19/2013 2009   CRITICAL CARE Performed by: Dr. Sedalia Muta  Total critical care time: 32 minutes  Critical care time was exclusive of separately billable procedures and treating other patients.  Critical care was necessary to treat or prevent imminent or life-threatening deterioration.  Critical care was time spent personally by me on the following activities: development of treatment plan with patient, as well as nursing, discussions with consultants, evaluation of patient's response to treatment, examination of patient, obtaining history from patient or surrogate, ordering and performing treatments and interventions, ordering and review of laboratory studies, ordering and review of radiographic studies, pulse oximetry and re-evaluation of patient's condition.  This document was prepared using Dragon Voice Recognition software and may include unintentional dictation errors.  Dr. Sedalia Muta [facility]  If 7PM-7AM, please contact overnight-coverage provider If 7AM-7PM, please contact day attending provider [email or web address]  02/20/2023, 6:33 PM

## 2023-02-20 NOTE — ED Provider Notes (Signed)
[facility] Provider Note    Event Date/Time   First MD Initiated Contact with Patient 02/20/23 1116     (approximate)  History   Chief Complaint: Code Stroke  HPI  George Gomez is a 63 y.o. male with a past medical history of CKD, hypertension, hyperlipidemia, alcohol use, presents to the emergency department for altered mental status and weakness.  According to EMS they were called to the patient's residence for slurred speech confusion and difficulty walking.  They noted initially some left-sided deficits, however upon arrival to the emergency department as a code stroke patient's deficits had largely resolved aside still continued mild confusion and somewhat sluggish responses.  Patient denies any pain chest pain abdominal pain denies any fever nausea vomiting diarrhea cough or congestion.  Code stroke activated by EMS and neurology is present upon arrival.  Physical Exam   Triage Vital Signs: ED Triage Vitals [02/20/23 1141]  Encounter Vitals Group     BP (!) 144/99     Systolic BP Percentile      Diastolic BP Percentile      Pulse Rate (!) 105     Resp 18     Temp 97.7 F (36.5 C)     Temp src      SpO2 100 %     Weight      Height      Head Circumference      Peak Flow      Pain Score      Pain Loc      Pain Education      Exclude from Growth Chart     Most recent vital signs: Vitals:   02/20/23 1145 02/20/23 1230  BP:  (!) 136/92  Pulse: (!) 110 95  Resp: (!) 21 20  Temp:    SpO2: 99% 100%    General: Patient is awake and alert somewhat sluggish responses but able to answer most questions appropriately. CV:  Good peripheral perfusion.  Regular rate and rhythm  Resp:  Normal effort.  Equal breath sounds bilaterally.  Abd:  No distention.  Soft, nontender.  No rebound or guarding. Other:  No obvious cranial nerve deficits, no pronator drift with equal grip strengths.   ED Results / Procedures / Treatments   EKG  EKG viewed  and interpreted by myself shows sinus tachycardia 107 bpm with a narrow QRS, left axis deviation, largely normal intervals with nonspecific ST changes.  RADIOLOGY  I have reviewed and interpreted the CT head images.  No obvious bleed on my evaluation. Radiology is read the CT scan as possible small lacunar infarct in left thalamus.   MEDICATIONS ORDERED IN ED: Medications  sodium chloride flush (NS) 0.9 % injection 3 mL (3 mLs Intravenous Given 02/20/23 1140)  iohexol (OMNIPAQUE) 350 MG/ML injection 100 mL (100 mLs Intravenous Contrast Given 02/20/23 1116)     IMPRESSION / MDM / ASSESSMENT AND PLAN / ED COURSE  I reviewed the triage vital signs and the nursing notes.  Patient's presentation is most consistent with acute presentation with potential threat to life or bodily function.  Patient presents to the emergency department for some confusion/sluggishness along with some speech difficulty this morning and left-sided deficits which have resolved.  Here the patient has no complaints, but he is somewhat sluggish in his responses to my questions.  Patient is a daily alcohol user does admit to 1 alcoholic drink today.  No reported seizure-like activity.  Patient's lab work today shows  a reassuring CBC, chemistry does show significant anion gap elevation possibly indicative of alcoholic ketoacidosis.  Will continue with IV hydration.  Alcohol level is returned only at 25, do not believe the patient symptoms are due to alcohol intoxication, however withdrawal seizure would still be on the differential with resolving postictal state.  CT scan of the head is showing a questionable lacunar infarct in the left thalamus age-indeterminate.  I spoke with neurology, they would like the patient admitted for further workup and treatment including an EEG.  Patient agreeable to plan of care.  Will discuss with the hospitalist for ongoing admission.  Not a TNK candidate at this time.  FINAL CLINICAL IMPRESSION(S)  / ED DIAGNOSES   CVA Alcoholic ketoacidosis  Note:  This document was prepared using Dragon voice recognition software and may include unintentional dictation errors.   Minna Antis, MD 02/20/23 1240

## 2023-02-20 NOTE — ED Notes (Signed)
1054 Code stroke cart activated- EMS pre-elert 1056 Dr Wilford Corner present in ED awaiting EMS arrival 1059 Door time- Dr Wilford Corner at bedside- Pt BIB EMS, AMS, slurred speech, R side weakness, difficulty walking. Pt with h/o ETOH abuse, HTN, HLD, CKD, DM- EMS reports ETOH was found at scene. 1102 pt to CT, Dr Wilford Corner dismisses TSRN, states will elert on cart if needed again

## 2023-02-20 NOTE — Code Documentation (Signed)
Stroke Response Nurse Documentation Code Documentation  George Gomez is a 63 y.o. male arriving to [facility] via [facility] on 02/20/2023 with past medical hx of HTN, DM, hypercholesteremia, ETOH dependence, GI bleed, B12 deficienct , folate deficiency, renal failure. On No antithrombotic. Code stroke was activated by EMS.   Patient from a friends house where his LKW is unclear. Symptoms discovered a 1000 and now complaining of unsteady gait and slurred speech. EMS called.  Stroke team at the bedside on patient arrival. Labs drawn and patient cleared for CT by Dr. Lenard Lance. Patient to CT with team. NIHSS 4, see documentation for details and code stroke times. Patient with disoriented, right leg weakness, and dysarthria  on exam. The following imaging was completed:  CT Head, CTA, and CTP. Patient is not a candidate for IV Thrombolytic due to unclear last known well, per MD. Patient is not a candidate for IR due to no LVO on imaging, per MD.   Care Plan: every 2 hours NIHSS and vital signs. Swallow screen per protocol.  Bedside handoff with ED RN Misty Stanley.    Wille Glaser  Stroke Response RN

## 2023-02-20 NOTE — ED Notes (Signed)
Pt sleeping at this time on stretcher, cardiac monitoring placed. BG WNL. Pt following commands.

## 2023-02-20 NOTE — Assessment & Plan Note (Signed)
Serum magnesium was 1.1 on admission, replaced with magnesium sulfate 2 g IV bolus on admission Check magnesium level in the a.m.

## 2023-02-20 NOTE — Consult Note (Signed)
NEUROLOGY CONSULT NOTE   Date of service: February 20, 2023 Patient Name: George Gomez MRN:  [id number] DOB:  1960-12-29 Chief Complaint: "Code stroke" Requesting Provider: No att. providers found  History of Present Illness  George Gomez is a 63 y.o. male with hx of alcohol abuse, hypertension, hyperlipidemia, diabetes, CKD 3 brought in as a code stroke from a friend's house where he was seen having symptoms of unsteady gait and slurred speech along with right facial droop.  The last known well is unclear-symptoms were discovered at 10 AM.  Initial examination by EMTs-right-sided facial droop, right grip strength weakness and ataxia. Admits to drinking alcohol but does not remember when how much he drank last. Attempted contact family was unsuccessful on the both numbers listed for the mother.  Patient not a good historian at this time.  LKW: Symptoms discovered at 10 AM-unclear when the last known well was. Modified rankin score: Presumably 0 0-Completely asymptomatic and back to baseline post- stroke IV Thrombolysis: Nonfocal, less suspicion for stroke, unclear last known well EVT: No ELVO  NIHSS components Score: Comment  1a Level of Conscious 0[x]  1[]  2[]  3[]      1b LOC Questions 0[]  1[]  2[x]       1c LOC Commands 0[x]  1[]  2[]       2 Best Gaze 0[x]  1[]  2[]       3 Visual 0[x]  1[]  2[]  3[]      4 Facial Palsy 0[x]  1[]  2[]  3[]      5a Motor Arm - left 0[x]  1[]  2[]  3[]  4[]  UN[]    5b Motor Arm - Right 0[x]  1[]  2[]  3[]  4[]  UN[]    6a Motor Leg - Left 0[x]  1[]  2[]  3[]  4[]  UN[]    6b Motor Leg - Right 0[x]  1[]  2[]  3[]  4[]  UN[]    7 Limb Ataxia 0[x]  1[]  2[]  3[]  UN[]     8 Sensory 0[x]  1[]  2[]  UN[]      9 Best Language 0[]  1[x]  2[]  3[]      10 Dysarthria 0[]  1[x]  2[]  UN[]      11 Extinct. and Inattention 0[x]  1[]  2[]       TOTAL: 4      ROS  Unable to reliably ascertain due to his mentation Past History   Past Medical History:  Diagnosis Date   Acute on chronic renal failure (HCC)  03/20/2016   Acute renal failure (ARF) (HCC) 08/28/2016   Acute renal failure (HCC) 10/12/2018   Anemia    B12 deficiency 12/11/2017   Clostridium difficile diarrhea 03/18/2016   Diabetes mellitus without complication (HCC)    Diverticulitis    Pt states diverticulitis   EtOH dependence (HCC)    Folate deficiency 07/25/2018   GI bleed 05/30/2017   Hypercholesteremia    Hypertension    Melena 10/21/2018   Weight loss 09/15/2018    Past Surgical History:  Procedure Laterality Date   COLONOSCOPY WITH PROPOFOL N/A 10/16/2018   Procedure: COLONOSCOPY WITH PROPOFOL;  Surgeon: Toney Reil, MD;  Location: [facility] ENDOSCOPY;  Service: Gastroenterology;  Laterality: N/A;   ENTEROSCOPY N/A 10/16/2018   Procedure: ENTEROSCOPY;  Surgeon: Toney Reil, MD;  Location: [facility] ENDOSCOPY;  Service: Gastroenterology;  Laterality: N/A;   ESOPHAGOGASTRODUODENOSCOPY N/A 07/02/2018   Procedure: ESOPHAGOGASTRODUODENOSCOPY (EGD);  Surgeon: Toney Reil, MD;  Location: [facility] ENDOSCOPY;  Service: Gastroenterology;  Laterality: N/A;   ESOPHAGOGASTRODUODENOSCOPY (EGD) WITH PROPOFOL N/A 06/01/2017   Procedure: ESOPHAGOGASTRODUODENOSCOPY (EGD) WITH PROPOFOL;  Surgeon: Wyline Mood, MD;  Location: [facility] ENDOSCOPY;  Service: Gastroenterology;  Laterality: N/A;   GIVENS  CAPSULE STUDY N/A 10/17/2018   Procedure: GIVENS CAPSULE STUDY;  Surgeon: Toney Reil, MD;  Location: [facility] ENDOSCOPY;  Service: Gastroenterology;  Laterality: N/A;   GIVENS CAPSULE STUDY N/A 08/02/2019   Procedure: GIVENS CAPSULE STUDY;  Surgeon: Toney Reil, MD;  Location: [facility] ENDOSCOPY;  Service: Gastroenterology;  Laterality: N/A;   none      Family History: Family History  Problem Relation Age of Onset   Rheum arthritis Mother    Diabetes Father    Heart disease Father    Cancer Father    COPD Sister    Rheum arthritis Sister    Diabetes Maternal Grandmother    Cancer Maternal Grandfather    Diabetes Paternal Grandmother     Cancer Paternal Grandfather     Social History  reports that he has never smoked. He has never used smokeless tobacco. He reports that he does not currently use alcohol after a past usage of about 3.0 standard drinks of alcohol per week. He reports that he does not use drugs.  Allergies  Allergen Reactions   Lisinopril Swelling   Losartan Swelling    Medications   Current Facility-Administered Medications:    sodium chloride flush (NS) 0.9 % injection 3 mL, 3 mL, Intravenous, Once, Minna Antis, MD  Current Outpatient Medications:    allopurinol (ZYLOPRIM) 300 MG tablet, Take 1 tablet (300 mg total) by mouth daily. (Patient not taking: Reported on 02/17/2023), Disp: 90 tablet, Rfl: 4   atorvastatin (LIPITOR) 40 MG tablet, Take 1 tablet (40 mg total) by mouth daily. (Patient not taking: Reported on 02/17/2023), Disp: 90 tablet, Rfl: 4   Blood Glucose Monitoring Suppl (ONETOUCH VERIO) w/Device KIT, Use to check blood sugar 3 times a day and document results, bring to appointments.  Goal is <130 fasting blood sugar and <180 two hours after meals. (Patient not taking: Reported on 02/17/2023), Disp: 1 kit, Rfl: 0   Cholecalciferol 50 MCG (2000 UT) CAPS, Take 1 capsule by mouth daily. (Patient not taking: Reported on 02/17/2023), Disp: , Rfl:    cyanocobalamin (VITAMIN B12) 1000 MCG tablet, Take 1 tablet (1,000 mcg total) by mouth daily. (Patient not taking: Reported on 02/17/2023), Disp: 90 tablet, Rfl: 4   gabapentin (NEURONTIN) 300 MG capsule, Take one tablet (300 MG) by mouth in the morning, then take one tablet (300 MG) by mouth at noon, and then take 600 MG (two tablets) by mouth before bedtime. (Patient taking differently: Take 1 capsule by mouth in the morning, 1 capsule at noon, and 2 capsules at bedtime as needed for neuropathy), Disp: 360 capsule, Rfl: 4   glucose blood (ONETOUCH VERIO) test strip, Use to check blood sugar 3 times a day and document results, bring to appointments.   Goal is <130 fasting blood sugar and <180 two hours after meals. (Patient not taking: Reported on 02/17/2023), Disp: 100 each, Rfl: 12   Lancets (ONETOUCH ULTRASOFT) lancets, Use to check blood sugar 3 times a day and document results, bring to appointments.  Goal is <130 fasting blood sugar and <180 two hours after meals. (Patient not taking: Reported on 02/17/2023), Disp: 100 each, Rfl: 12   magnesium oxide (MAG-OX) 400 (240 Mg) MG tablet, Take 1 tablet (400 mg total) by mouth 2 (two) times daily. (Patient not taking: Reported on 02/17/2023), Disp: 180 tablet, Rfl: 4   metFORMIN (GLUCOPHAGE-XR) 500 MG 24 hr tablet, Take 1 tablet (500 mg total) by mouth daily. (Patient not taking: Reported on 02/17/2023), Disp: 90 tablet,  Rfl: 4   ondansetron (ZOFRAN) 4 MG tablet, Take 1 tablet (4 mg total) by mouth every 8 (eight) hours as needed. (Patient not taking: Reported on 02/17/2023), Disp: 15 tablet, Rfl: 0   pantoprazole (PROTONIX) 40 MG tablet, Take 1 tablet (40 mg total) by mouth daily. (Patient not taking: Reported on 02/17/2023), Disp: 90 tablet, Rfl: 4   potassium chloride (KLOR-CON M) 10 MEQ tablet, Take 2 tablets (20 mEq total) by mouth daily for 5 days. (Patient not taking: Reported on 02/17/2023), Disp: 10 tablet, Rfl: 0   propranolol ER (INDERAL LA) 60 MG 24 hr capsule, Take 1 capsule (60 mg total) by mouth daily. (Patient not taking: Reported on 02/17/2023), Disp: 90 capsule, Rfl: 4   tiZANidine (ZANAFLEX) 2 MG tablet, TAKE 1 TABLET(2 MG) BY MOUTH EVERY 8 HOURS AS NEEDED FOR MUSCLE SPASMS (Patient not taking: Reported on 02/17/2023), Disp: 30 tablet, Rfl: 0   triamcinolone cream (KENALOG) 0.1 %, Apply 1 application topically 2 (two) times daily. (Patient not taking: Reported on 02/17/2023), Disp: 453.6 g, Rfl: 0   VOLTAREN 1 % GEL, Apply 4 gram to affected area four times a day as needed for pain for knee pain. Do not exceed 16gm for LE joint per day (Patient not taking: Reported on 02/17/2023), Disp: , Rfl:    Vitals   Vitals:   03/05/23 1141 03-05-2023 1145  BP: (!) 144/99   Pulse: (!) 105 (!) 110  Resp: 18 (!) 21  Temp: 97.7 F (36.5 C)   SpO2: 100% 99%    There is no height or weight on file to calculate BMI.  Physical Exam  General: Awake alert somewhat confused appearing HEENT: Normocephalic/atraumatic Chest: Clear Cardiovascular: Regular rhythm Exam He is awake, alert, cannot tell me his correct age or the current month. Upon asking him if he drinks he said yes.  Could not tell me how much or when he drank last. Cranial nerves II to XII intact Motor examination with no drift in the upper extremities.  There is mild weakness on the right lower extremity and some drift but that is mostly due to pain and effort related.  Left leg also has some effort dependent weakness but not much of a drift. Sensation intact Coordination examination with no dysmetria    Labs/Imaging/Neurodiagnostic studies   CBC:  Recent Labs  Lab 03-05-2023 1100  WBC 4.9  NEUTROABS 3.0  HGB 9.1*  HCT 29.2*  MCV 98.6  PLT 145*   Basic Metabolic Panel:  Lab Results  Component Value Date   NA 140 03/05/2023   K 4.0 Mar 05, 2023   CO2 11 (L) 05-Mar-2023   GLUCOSE 119 (H) March 05, 2023   BUN 8 March 05, 2023   CREATININE 0.97 March 05, 2023   CALCIUM 7.9 (L) March 05, 2023   GFRNONAA >60 03-05-2023   GFRAA >60 09/27/2019   Lipid Panel:  Lab Results  Component Value Date   LDLCALC 70 01/23/2023   HgbA1c:  Lab Results  Component Value Date   HGBA1C 5.1 01/23/2023   Urine Drug Screen:  Pending  Alcohol Level     Component Value Date/Time   ETH 25 (H) March 05, 2023 1146   INR  Lab Results  Component Value Date   INR 1.0 07/01/2018   CT Head without contrast(Personally reviewed): Small lacunar infarct in the left thalamus new from prior head CT of 04/11/2019 but otherwise age-indeterminate.  Brain MRI recommended.  No acute intracranial hemorrhage.  Background parenchymal atrophy, chronic small vessel  disease.  Chronic lacunar infarcts.  CT angio Head and Neck with contrast(Personally reviewed): No proximal intracranial LVO.  Nonstenotic atherosclerotic plaque within the intracranial ICAs.  Carotids are patent in the neck with proximal right ICA less than 50% stenosis.  Vertebral arteries patent in the neck without stenosis or significant atherosclerotic disease.  Aortic atherosclerosis.  ASSESSMENT   George Gomez is a 63 y.o. male past medical history as above brought in for concern for strokelike symptoms when he was noted to be walking into a friend's house with gait difficulty, right-sided weakness and right facial weakness.  The facial weakness and symptoms of right-sided weakness rapidly improved but he still remains confused as to where he is, how old he is etc. He has a significant history of alcohol abuse.  He could not tell me exactly when his last drink was.  His alcohol level was 25 mg/dL. He still not completely back to his baseline-this might be related to alcohol use or intoxication but I wonder if he had any sort of withdrawal seizure with ensuing confusion/postictal state.  Also given the focality of right-sided weakness by EMS, TIA is also possible  Impression: Alcohol abuse related encephalopathy versus withdrawal seizures versus TIA.  RECOMMENDATIONS  Admit to hospitalist Frequent rechecks Telemetry MRI brain without contrast 2D echo A1c Lipid panel Routine EEG Aspirin 81 High intensity statin for goal LDL less than 70 CIWA protocol Counseling on alcohol cessation Blood pressure parameters-allow for permissive hypertension for next day or so-treat only if systolics greater than 220 on a as needed basis. Plan was discussed with Dr. Lenard Lance, EDP. Will follow ______________________________________________________________________    Dene Gentry, MD [facility]

## 2023-02-20 NOTE — Assessment & Plan Note (Signed)
Insulin SSI with at bedtime coverage ordered

## 2023-02-20 NOTE — Assessment & Plan Note (Signed)
Secondary to alcohol abuse

## 2023-02-20 NOTE — Assessment & Plan Note (Signed)
Hydralazine 5 mg IV every 4 hours as needed for SBP greater than 220, 1 day ordered

## 2023-02-20 NOTE — Assessment & Plan Note (Signed)
Seizure precaution, CIWA protocol Nursing aware

## 2023-02-21 ENCOUNTER — Inpatient Hospital Stay (HOSPITAL_COMMUNITY)
Admit: 2023-02-21 | Discharge: 2023-02-21 | Disposition: A | Discharge status: Home / Self Care | Payer: 59 | Attending: Internal Medicine | Admitting: Internal Medicine

## 2023-02-21 DIAGNOSIS — F1010 Alcohol abuse, uncomplicated: Secondary | ICD-10-CM

## 2023-02-21 DIAGNOSIS — I6389 Other cerebral infarction: Secondary | ICD-10-CM | POA: Diagnosis not present

## 2023-02-21 DIAGNOSIS — R2990 Unspecified symptoms and signs involving the nervous system: Secondary | ICD-10-CM | POA: Diagnosis not present

## 2023-02-21 LAB — CBG MONITORING, ED
Glucose-Capillary: 86 mg/dL (ref 70–99)
Glucose-Capillary: 68 mg/dL — ABNORMAL LOW (ref 70–99)
Glucose-Capillary: 87 mg/dL (ref 70–99)
Glucose-Capillary: 97 mg/dL (ref 70–99)

## 2023-02-21 LAB — URINE DRUG SCREEN, QUALITATIVE (ARMC ONLY)
Amphetamines, Ur Screen: NOT DETECTED
Barbiturates, Ur Screen: NOT DETECTED
Benzodiazepine, Ur Scrn: NOT DETECTED
Cannabinoid 50 Ng, Ur ~~LOC~~: NOT DETECTED
Cocaine Metabolite,Ur ~~LOC~~: NOT DETECTED
MDMA (Ecstasy)Ur Screen: NOT DETECTED
Methadone Scn, Ur: NOT DETECTED
Opiate, Ur Screen: NOT DETECTED
Phencyclidine (PCP) Ur S: NOT DETECTED
Tricyclic, Ur Screen: NOT DETECTED

## 2023-02-21 LAB — LIPID PANEL
Cholesterol: 211 mg/dL — ABNORMAL HIGH (ref 0–200)
HDL: 114 mg/dL (ref >40)
LDL Cholesterol: 80 mg/dL (ref 0–99)
Total CHOL/HDL Ratio: 1.9 {ratio}
Triglycerides: 84 mg/dL (ref <150)
VLDL: 17 mg/dL (ref 0–40)

## 2023-02-21 LAB — HEMOGLOBIN A1C
Hgb A1c MFr Bld: 5.4 % (ref 4.8–5.6)
Mean Plasma Glucose: 108.28 mg/dL

## 2023-02-21 LAB — MAGNESIUM: Magnesium: 1.8 mg/dL (ref 1.7–2.4)

## 2023-02-21 MED ORDER — ENOXAPARIN SODIUM 40 MG/0.4ML IJ SOSY
40.0000 mg | PREFILLED_SYRINGE | INTRAMUSCULAR | Status: DC
  Administered 2023-02-21: 40 mg via SUBCUTANEOUS
  Filled 2023-02-21: qty 0.4

## 2023-02-21 MED ORDER — PROPRANOLOL HCL ER 60 MG PO CP24
60.0000 mg | ORAL_CAPSULE | Freq: Every day | ORAL | Status: DC
  Administered 2023-02-21 – 2023-02-22 (×2): 60 mg via ORAL
  Filled 2023-02-21 (×2): qty 1

## 2023-02-21 NOTE — Progress Notes (Signed)
  Echocardiogram 2D Echocardiogram has been performed.  Lenor Coffin 02/21/2023, 1:01 PM

## 2023-02-21 NOTE — ED Notes (Signed)
This Registered Nurse (RN) assumed responsibility for the care of the assigned patient at 1900 on 02/21/23. All nursing tasks, documentation, and responsibilities prior to this time are not the responsibility of this RN. Any assessments, interventions, or documentation completed before this time are not within the scope of this RN's duties.  Effective 1900 on 02/21/23, this RN is now accountable for all aspects of patient care, including but not limited to assessments, interventions, documentation, medication administration, and care coordination. All future tasks and documentation will be managed and completed by this RN from this time until care handoff at 0700 on 02/22/23.

## 2023-02-21 NOTE — Evaluation (Signed)
Physical Therapy Evaluation Patient Details Name: George Gomez MRN: [id number] DOB: 09-01-1960 Today's Date: 02/21/2023  History of Present Illness  63 y.o. male past medical history for hypertension, hyperlipidemia, diabetes, CKD 3 and alcohol abuse brought in for concern for strokelike symptoms due to abnormal gait and right-sided transient weakness noticed by EMTs.  His right-sided weakness had resolved but he still was confused and altered at the time of arrival.  Clinical Impression  Pt laying in bed in NAD, able to answer most questions w/o issue (date, situation, etc) but did have some minimally delayed responses at times.  He was able to do all requested mobility, strength testing and other acts w/o issue.  Pt reports chronic medial L hand numbness and that otherwise he feels essentially at his baseline. Pt was able to ambulate with and w/o AD but was more stable and confident using walker.  Pt will benefit from continued PT.      If plan is discharge home, recommend the following: Assist for transportation   Can travel by private vehicle        Equipment Recommendations Rolling walker (2 wheels)  Recommendations for Other Services       Functional Status Assessment Patient has had a recent decline in their functional status and demonstrates the ability to make significant improvements in function in a reasonable and predictable amount of time.     Precautions / Restrictions Precautions Precautions: Fall Restrictions Weight Bearing Restrictions Per Provider Order: No      Mobility  Bed Mobility Overal bed mobility: Modified Independent             General bed mobility comments: able to get to sitting EOB with relative ease    Transfers Overall transfer level: Modified independent Equipment used: Rolling walker (2 wheels), None               General transfer comment: able to rise w/o need of AD to stabilize, showed more confidence with light UE assist on  walker however    Ambulation/Gait Ambulation/Gait assistance: Modified independent (Device/Increase time) Gait Distance (Feet): 60 Feet Assistive device: Rolling walker (2 wheels), None, 1 person hand held assist         General Gait Details: one small loop with walker, one w/ single HHA and one with no AD.  Pt did display some mild lateral lean with steppage w/o LOBs but clearly with increased confidence/stability when using RW.  He did have increasing fatigue with the effort and HR got up to 140s; deferred more prolonged effort due to tachycardia.  Stairs            Wheelchair Mobility     Tilt Bed    Modified Rankin (Stroke Patients Only)       Balance Overall balance assessment: Modified Independent                                           Pertinent Vitals/Pain Pain Assessment Pain Assessment: No/denies pain    Home Living Family/patient expects to be discharged to:: Private residence Living Arrangements: Alone Available Help at Discharge: Friend(s);Family;Available PRN/intermittently   Home Access: Stairs to enter   Entrance Stairs-Number of Steps: 1   Home Layout: One level Home Equipment: Cane - single point      Prior Function Prior Level of Function : Independent/Modified Independent;Driving  Mobility Comments: Pt reports that he is out of the home most days, runs all his errands, etc - does everything but mow his lawn ADLs Comments: reports independence no issues with ADLs     Extremity/Trunk Assessment   Upper Extremity Assessment Upper Extremity Assessment: Overall WFL for tasks assessed (strength grossly = bilaterally)    Lower Extremity Assessment Lower Extremity Assessment: Overall WFL for tasks assessed (strength grossly = bilaterally)       Communication   Communication Communication: No apparent difficulties  Cognition Arousal: Alert Behavior During Therapy: WFL for tasks  assessed/performed Overall Cognitive Status: Within Functional Limits for tasks assessed                                 General Comments: generally slow but able to answer most questions appropriately        General Comments General comments (skin integrity, edema, etc.): Pt was able to get up and move with confidence and safety.    Exercises     Assessment/Plan    PT Assessment Patient needs continued PT services  PT Problem List Cardiopulmonary status limiting activity;Decreased activity tolerance;Decreased balance;Decreased safety awareness;Decreased knowledge of use of DME       PT Treatment Interventions DME instruction;Gait training;Stair training;Therapeutic exercise;Therapeutic activities;Cognitive remediation;Patient/family education;Balance training;Functional mobility training    PT Goals (Current goals can be found in the Care Plan section)  Acute Rehab PT Goals Patient Stated Goal: go home PT Goal Formulation: With patient Time For Goal Achievement: 03/06/23 Potential to Achieve Goals: Good    Frequency Min 1X/week     Co-evaluation               AM-PAC PT "6 Clicks" Mobility  Outcome Measure Help needed turning from your back to your side while in a flat bed without using bedrails?: None Help needed moving from lying on your back to sitting on the side of a flat bed without using bedrails?: None Help needed moving to and from a bed to a chair (including a wheelchair)?: None Help needed standing up from a chair using your arms (e.g., wheelchair or bedside chair)?: None Help needed to walk in hospital room?: A Little Help needed climbing 3-5 steps with a railing? : A Little 6 Click Score: 22    End of Session Equipment Utilized During Treatment: Gait belt Activity Tolerance: Patient limited by fatigue Patient left: with bed alarm set;with call bell/phone within reach Nurse Communication: Mobility status PT Visit Diagnosis: Muscle  weakness (generalized) (M62.81);Difficulty in walking, not elsewhere classified (R26.2)    Time: 1610-9604 PT Time Calculation (min) (ACUTE ONLY): 32 min   Charges:   PT Evaluation $PT Eval Low Complexity: 1 Low PT Treatments $Gait Training: 8-22 mins PT General Charges $$ ACUTE PT VISIT: 1 Visit         Malachi Pro, DPT 02/21/2023, 11:33 AM

## 2023-02-21 NOTE — ED Notes (Addendum)
Pt walks to bedside toilet w/ unsteady gait. +urinates. Pants and socks changed. A+Ox3 at this time, disoriented to situation.

## 2023-02-21 NOTE — Progress Notes (Signed)
NEUROLOGY CONSULT FOLLOW UP NOTE   Date of service: February 21, 2023 Patient Name: George Gomez MRN:  [id number] DOB:  1960-03-30  Interval Hx/subjective  No acute changes overnight. EEG-within normal limits  Vitals   Vitals:   02/21/23 0400 02/21/23 0430 02/21/23 0608 02/21/23 0730  BP:  (!) 144/98 (!) 150/107 (!) 144/98  Pulse:  97 (!) 106 (!) 114  Resp:  (!) 24  (!) 30  Temp: 98.7 F (37.1 C)     TempSrc: Oral     SpO2:  99%  99%     There is no height or weight on file to calculate BMI.  Physical Exam   Constitutional: Appears well-developed and well-nourished.  Psych: Affect appropriate to situation.  Eyes: No scleral injection.  HENT: No OP obstrucion.  Head: Normocephalic.  Cardiovascular: Normal rate and regular rhythm.  Respiratory: Effort normal, non-labored breathing.  GI: Soft.  No distension. There is no tenderness.  Skin: WDI.   Neurologic Examination  Awake alert oriented x 3 today. Appears a little shaky overall No dysarthria No aphasia Diminished attention concentration Cranial nerves II to XII intact Motor examination with no drift in any of the 4 extremities.  Able to give much more effort on both lower extremities today without any vertical drift. Sensation intact to light touch. Coordination examination with mild action tremor bilaterally but no gross dysmetria.   Medications  Current Facility-Administered Medications:     stroke: early stages of recovery book, , Does not apply, Once, Cox, Amy N, DO   acetaminophen (TYLENOL) tablet 650 mg, 650 mg, Oral, Q4H PRN **OR** acetaminophen (TYLENOL) 160 MG/5ML solution 650 mg, 650 mg, Per Tube, Q4H PRN **OR** acetaminophen (TYLENOL) suppository 650 mg, 650 mg, Rectal, Q4H PRN, Cox, Amy N, DO   folic acid (FOLVITE) tablet 1 mg, 1 mg, Oral, Daily, Cox, Amy N, DO, 1 mg at 02/20/23 1626   hydrALAZINE (APRESOLINE) injection 5 mg, 5 mg, Intravenous, Q4H PRN, Cox, Amy N, DO   insulin aspart (novoLOG)  injection 0-15 Units, 0-15 Units, Subcutaneous, TID WC, Cox, Amy N, DO   insulin aspart (novoLOG) injection 0-5 Units, 0-5 Units, Subcutaneous, QHS, Cox, Amy N, DO   LORazepam (ATIVAN) tablet 1-4 mg, 1-4 mg, Oral, Q1H PRN **OR** LORazepam (ATIVAN) injection 1-4 mg, 1-4 mg, Intravenous, Q1H PRN, Cox, Amy N, DO, 1 mg at 02/20/23 2052   multivitamin with minerals tablet 1 tablet, 1 tablet, Oral, Daily, Cox, Amy N, DO, 1 tablet at 02/20/23 1626   senna-docusate (Senokot-S) tablet 1 tablet, 1 tablet, Oral, QHS PRN, Cox, Amy N, DO   thiamine (VITAMIN B1) tablet 100 mg, 100 mg, Oral, Daily, 100 mg at 02/20/23 1626 **OR** thiamine (VITAMIN B1) injection 100 mg, 100 mg, Intravenous, Daily, Cox, Amy N, DO  Current Outpatient Medications:    allopurinol (ZYLOPRIM) 300 MG tablet, Take 1 tablet (300 mg total) by mouth daily., Disp: 90 tablet, Rfl: 4   atorvastatin (LIPITOR) 40 MG tablet, Take 1 tablet (40 mg total) by mouth daily., Disp: 90 tablet, Rfl: 4   gabapentin (NEURONTIN) 300 MG capsule, Take one tablet (300 MG) by mouth in the morning, then take one tablet (300 MG) by mouth at noon, and then take 600 MG (two tablets) by mouth before bedtime. (Patient taking differently: Take 1 capsule by mouth in the morning, 1 capsule at noon, and 2 capsules at bedtime as needed for neuropathy), Disp: 360 capsule, Rfl: 4   metFORMIN (GLUCOPHAGE-XR) 500 MG 24 hr tablet, Take  1 tablet (500 mg total) by mouth daily., Disp: 90 tablet, Rfl: 4   pantoprazole (PROTONIX) 40 MG tablet, Take 1 tablet (40 mg total) by mouth daily., Disp: 90 tablet, Rfl: 4   propranolol ER (INDERAL LA) 60 MG 24 hr capsule, Take 1 capsule (60 mg total) by mouth daily., Disp: 90 capsule, Rfl: 4   Blood Glucose Monitoring Suppl (ONETOUCH VERIO) w/Device KIT, Use to check blood sugar 3 times a day and document results, bring to appointments.  Goal is <130 fasting blood sugar and <180 two hours after meals. (Patient not taking: Reported on 02/17/2023),  Disp: 1 kit, Rfl: 0   Cholecalciferol 50 MCG (2000 UT) CAPS, Take 1 capsule by mouth daily. (Patient not taking: Reported on 02/17/2023), Disp: , Rfl:    cyanocobalamin (VITAMIN B12) 1000 MCG tablet, Take 1 tablet (1,000 mcg total) by mouth daily. (Patient not taking: Reported on 02/17/2023), Disp: 90 tablet, Rfl: 4   glucose blood (ONETOUCH VERIO) test strip, Use to check blood sugar 3 times a day and document results, bring to appointments.  Goal is <130 fasting blood sugar and <180 two hours after meals. (Patient not taking: Reported on 02/17/2023), Disp: 100 each, Rfl: 12   Lancets (ONETOUCH ULTRASOFT) lancets, Use to check blood sugar 3 times a day and document results, bring to appointments.  Goal is <130 fasting blood sugar and <180 two hours after meals. (Patient not taking: Reported on 02/17/2023), Disp: 100 each, Rfl: 12   magnesium oxide (MAG-OX) 400 (240 Mg) MG tablet, Take 1 tablet (400 mg total) by mouth 2 (two) times daily. (Patient not taking: Reported on 02/17/2023), Disp: 180 tablet, Rfl: 4   ondansetron (ZOFRAN) 4 MG tablet, Take 1 tablet (4 mg total) by mouth every 8 (eight) hours as needed. (Patient not taking: Reported on 02/17/2023), Disp: 15 tablet, Rfl: 0   potassium chloride (KLOR-CON M) 10 MEQ tablet, Take 2 tablets (20 mEq total) by mouth daily for 5 days. (Patient not taking: Reported on 02/17/2023), Disp: 10 tablet, Rfl: 0   tiZANidine (ZANAFLEX) 2 MG tablet, TAKE 1 TABLET(2 MG) BY MOUTH EVERY 8 HOURS AS NEEDED FOR MUSCLE SPASMS (Patient not taking: Reported on 02/17/2023), Disp: 30 tablet, Rfl: 0   triamcinolone cream (KENALOG) 0.1 %, Apply 1 application topically 2 (two) times daily. (Patient not taking: Reported on 02/17/2023), Disp: 453.6 g, Rfl: 0   VOLTAREN 1 % GEL, Apply 4 gram to affected area four times a day as needed for pain for knee pain. Do not exceed 16gm for LE joint per day (Patient not taking: Reported on 02/17/2023), Disp: , Rfl:   Labs and Diagnostic Imaging    CBC:  Recent Labs  Lab 02/20/23 1100  WBC 4.9  NEUTROABS 3.0  HGB 9.1*  HCT 29.2*  MCV 98.6  PLT 145*    Basic Metabolic Panel:  Lab Results  Component Value Date   NA 140 02/20/2023   K 4.0 02/20/2023   CO2 11 (L) 02/20/2023   GLUCOSE 119 (H) 02/20/2023   BUN 8 02/20/2023   CREATININE 0.97 02/20/2023   CALCIUM 7.9 (L) 02/20/2023   GFRNONAA >60 02/20/2023   GFRAA >60 09/27/2019   Lipid Panel:  Lab Results  Component Value Date   LDLCALC 80 02/21/2023   HgbA1c:  Lab Results  Component Value Date   HGBA1C 5.4 02/21/2023   Urine Drug Screen:     Component Value Date/Time   LABOPIA NONE DETECTED 02/21/2023 0155   COCAINSCRNUR NONE DETECTED  02/21/2023 0155   LABBENZ NONE DETECTED 02/21/2023 0155   AMPHETMU NONE DETECTED 02/21/2023 0155   THCU NONE DETECTED 02/21/2023 0155   LABBARB NONE DETECTED 02/21/2023 0155    Alcohol Level     Component Value Date/Time   ETH 25 (H) 02/20/2023 1146   INR  Lab Results  Component Value Date   INR 1.2 02/20/2023   APTT  Lab Results  Component Value Date   APTT 28 02/20/2023   CT Head without contrast(Personally reviewed): Small lacunar infarct in the left thalamus new from prior head CT of 04/11/2019 but otherwise age-indeterminate.  Brain MRI recommended.  No acute intracranial hemorrhage.  Background parenchymal atrophy, chronic small vessel disease.  Chronic lacunar infarcts.     CT angio Head and Neck with contrast(Personally reviewed): No proximal intracranial LVO.  Nonstenotic atherosclerotic plaque within the intracranial ICAs.  Carotids are patent in the neck with proximal right ICA less than 50% stenosis.  Vertebral arteries patent in the neck without stenosis or significant atherosclerotic disease.  Aortic atherosclerosis.  MRI brain personally reviewed-no acute intracranial process.   2D echo-pending  Assessment   George Gomez is a 63 y.o. male past medical history for hypertension, hyperlipidemia,  diabetes, CKD 3 and alcohol abuse brought in for concern for strokelike symptoms due to abnormal gait and right-sided transient weakness noticed by EMTs. His right-sided weakness had resolved but he still was confused and altered at the time of arrival.  Left hemispheric TIA versus seizure versus encephalopathy related to alcohol use was in the differentials.  Workup thus far: CT head, CT angio head and MRI brain unremarkable for acute stroke. EEG is normal.  No witnessed seizures. A1 C is within goal LDL 80-goal is less than 70.    Recommendations  Probably needs CIWA protocol for now. From a stroke/TIA standpoint, low suspicion for this being a TIA but continue aspirin and high intensity statin for LDL goal less than 70. Blood pressure goal-normotension Management of alcohol withdrawal per primary team Please call back if the echo results are concerning. Plan discussed with Dr. Mayford Knife.  Neurology will be available as needed. Outpatient follow-up in 8 to 12 weeks-neurology clinic ______________________________________________________________________   Signed, Milon Dikes, MD [facility]

## 2023-02-21 NOTE — Evaluation (Signed)
Occupational Therapy Evaluation Patient Details Name: George Gomez MRN: [id number] DOB: July 20, 1960 Today's Date: 02/21/2023   History of Present Illness 63 y.o. male past medical history for hypertension, hyperlipidemia, diabetes, CKD 3 and alcohol abuse brought in for concern for strokelike symptoms due to abnormal gait and right-sided transient weakness noticed by EMTs.  His right-sided weakness had resolved but he still was confused and altered at the time of arrival.   Clinical Impression   Pt was seen for OT evaluation this date. Prior to hospital admission, pt was generally independent and living with a friend. Pt presents to acute OT demonstrating impaired ADL performance and functional mobility 2/2 decreased balance, activity tolerance, and slightly slow cognitive processing (See OT problem list for additional functional deficits). Pt currently requires CGA-HHA for ADL transfers and initially braces BLE against the bed for stability. Not orthostatic upon assessment despite pt noting mild dizziness with positional changes. Pt would benefit from skilled OT services to address noted impairments and functional limitations (see below for any additional details) in order to maximize safety and independence while minimizing falls risk and caregiver burden.     If plan is discharge home, recommend the following: A little help with walking and/or transfers;A little help with bathing/dressing/bathroom;Assistance with cooking/housework;Assist for transportation;Help with stairs or ramp for entrance    Functional Status Assessment  Patient has had a recent decline in their functional status and demonstrates the ability to make significant improvements in function in a reasonable and predictable amount of time.  Equipment Recommendations  Tub/shower seat;Other (comment) (2WW)    Recommendations for Other Services       Precautions / Restrictions Precautions Precautions:  Fall Restrictions Weight Bearing Restrictions Per Provider Order: No      Mobility Bed Mobility Overal bed mobility: Modified Independent                  Transfers Overall transfer level: Modified independent Equipment used: None               General transfer comment: SBA      Balance Overall balance assessment: Mild deficits observed, not formally tested                                         ADL either performed or assessed with clinical judgement   ADL Overall ADL's : Needs assistance/impaired                                       General ADL Comments: Pt required SBA-CGA in standing for ADL and mobility, MIN A for sock mgt.     Vision         Perception         Praxis         Pertinent Vitals/Pain Pain Assessment Pain Assessment: No/denies pain     Extremity/Trunk Assessment Upper Extremity Assessment Upper Extremity Assessment: Overall WFL for tasks assessed   Lower Extremity Assessment Lower Extremity Assessment: Overall WFL for tasks assessed       Communication Communication Communication: No apparent difficulties   Cognition Arousal: Alert Behavior During Therapy: WFL for tasks assessed/performed Overall Cognitive Status: Within Functional Limits for tasks assessed  General Comments: generally slow but able to answer most questions appropriately     General Comments  Pt was able to get up and move with confidence and safety.    Exercises     Shoulder Instructions      Home Living Family/patient expects to be discharged to:: Private residence Living Arrangements: Alone Available Help at Discharge: Friend(s);Family;Available PRN/intermittently   Home Access: Stairs to enter Entrance Stairs-Number of Steps: 1   Home Layout: One level     Bathroom Shower/Tub: Walk-in shower         Home Equipment: Cane - single point           Prior Functioning/Environment Prior Level of Function : Independent/Modified Independent;Driving             Mobility Comments: Pt reports that he is out of the home most days, runs all his errands, etc - does everything but mow his lawn ADLs Comments: reports independence no issues with ADLs        OT Problem List: Decreased activity tolerance;Decreased safety awareness;Impaired balance (sitting and/or standing);Decreased knowledge of use of DME or AE      OT Treatment/Interventions: Self-care/ADL training;Therapeutic exercise;Therapeutic activities;Energy conservation;DME and/or AE instruction;Patient/family education;Balance training    OT Goals(Current goals can be found in the care plan section) Acute Rehab OT Goals Patient Stated Goal: go home OT Goal Formulation: With patient Time For Goal Achievement: 03/07/23 Potential to Achieve Goals: Good ADL Goals Pt Will Perform Lower Body Dressing: with modified independence;sit to/from stand Pt Will Transfer to Toilet: with modified independence;ambulating (LRAD) Pt Will Perform Toileting - Clothing Manipulation and hygiene: with modified independence Additional ADL Goal #1: Pt will verbalize plan to implement at least 2 learned falls prevention/energy conservation strategies to maximize safety/indep with ADL and mobility.  OT Frequency: Min 1X/week    Co-evaluation              AM-PAC OT "6 Clicks" Daily Activity     Outcome Measure Help from another person eating meals?: None Help from another person taking care of personal grooming?: None Help from another person toileting, which includes using toliet, bedpan, or urinal?: A Little Help from another person bathing (including washing, rinsing, drying)?: A Little Help from another person to put on and taking off regular upper body clothing?: None Help from another person to put on and taking off regular lower body clothing?: A Little 6 Click Score: 21   End of Session     Activity Tolerance: Patient tolerated treatment well Patient left: in bed;with call bell/phone within reach;with bed alarm set  OT Visit Diagnosis: Other abnormalities of gait and mobility (R26.89)                Time: 9604-5409 OT Time Calculation (min): 13 min Charges:  OT General Charges $OT Visit: 1 Visit OT Evaluation $OT Eval Low Complexity: 1 Low  Arman Filter., MPH, MS, OTR/L ascom [phone] 02/21/23, 2:10 PM

## 2023-02-21 NOTE — Progress Notes (Signed)
PROGRESS NOTE    George Gomez  ZOX:[id number] DOB: 1960-07-23 DOA: 02/20/2023 PCP: Marjie Skiff, NP    Assessment & Plan:   Principal Problem:   Stroke-like symptom Active Problems:   Alcohol abuse   Thrombocytopenia (HCC)   Elevated LFTs   Hyperlipidemia due to type 2 diabetes mellitus (HCC)   Hypertension associated with type 2 diabetes mellitus (HCC)   Gastroesophageal reflux disease   Type 2 diabetes mellitus with diabetic neuropathy (HCC)   Type 2 diabetes mellitus with proteinuria (HCC)   Hypomagnesemia   Hepatic steatosis   Seizure due to alcohol withdrawal (HCC)   Alcohol withdrawal (HCC)  Assessment and Plan:  Stroke-like symptom: but CVA r/o w/ MRI brain. Echo pending. Neuro recs apprec    Seizure: due to alcohol withdrawal. Continue on CIWA protocol. Alcohol level 25. No need for seizure medication at this time.    Hypomagnesemia: WNL today    DM2: well controlled, HbA1c 5.4. Continue on SSI w/ accuchekcs   HTN: continue on home dose of propranolol    Transaminitis: likely secondary to alcohol abuse   Thrombocytopenia: likely secondary to alcohol abuse.    Alcohol abuse: continue on CIWA protocol. Alcohol level 25. Drinks 3 shots of 3 liquor daily. Received alcohol cessation counseling        DVT prophylaxis: lovenox Code Status: full  Family Communication:  Disposition Plan: depends on PT/OT recs   Level of care: Progressive  Status is: Inpatient Remains inpatient appropriate because: severity of illness    Consultants:  Neuro   Procedures:   Antimicrobials:  Subjective: Pt c/o being thirsty   Objective: Vitals:   02/21/23 0400 02/21/23 0430 02/21/23 0608 02/21/23 0730  BP:  (!) 144/98 (!) 150/107 (!) 144/98  Pulse:  97 (!) 106 (!) 114  Resp:  (!) 24  (!) 30  Temp: 98.7 F (37.1 C)     TempSrc: Oral     SpO2:  99%  99%   No intake or output data in the 24 hours ending 02/21/23 0812 There were no vitals filed for this  visit.  Examination:  General exam: Appears calm and comfortable  Respiratory system: Clear to auscultation. Respiratory effort normal. Cardiovascular system: S1 & S2 +. No rubs, gallops or clicks.  Gastrointestinal system: Abdomen is nondistended, soft and nontender.Normal bowel sounds heard. Central nervous system: Alert and oriented. Moves all extremities  Psychiatry: Judgement and insight appear normal. Flat mood and affect    Data Reviewed: I have personally reviewed following labs and imaging studies  CBC: Recent Labs  Lab 02/20/23 1100  WBC 4.9  NEUTROABS 3.0  HGB 9.1*  HCT 29.2*  MCV 98.6  PLT 145*   Basic Metabolic Panel: Recent Labs  Lab 02/20/23 1100 02/21/23 0257  NA 140  --   K 4.0  --   CL 102  --   CO2 11*  --   GLUCOSE 119*  --   BUN 8  --   CREATININE 0.97  --   CALCIUM 7.9*  --   MG 1.1* 1.8  PHOS 4.3  --    GFR: Estimated Creatinine Clearance: 81.9 mL/min (by C-G formula based on SCr of 0.97 mg/dL). Liver Function Tests: Recent Labs  Lab 02/20/23 1100  AST 111*  ALT 19  ALKPHOS 94  BILITOT 1.2  PROT 7.4  ALBUMIN 3.8   No results for input(s): "LIPASE", "AMYLASE" in the last 168 hours. No results for input(s): "AMMONIA" in the last 168 hours. Coagulation  Profile: Recent Labs  Lab 02/20/23 1100  INR 1.2   Cardiac Enzymes: No results for input(s): "CKTOTAL", "CKMB", "CKMBINDEX", "TROPONINI" in the last 168 hours. BNP (last 3 results) No results for input(s): "PROBNP" in the last 8760 hours. HbA1C: No results for input(s): "HGBA1C" in the last 72 hours. CBG: Recent Labs  Lab 02/20/23 1104 02/20/23 1947 02/20/23 2133 02/21/23 0640 02/21/23 0806  GLUCAP 110* 98 78 86 68*   Lipid Profile: Recent Labs    02/21/23 0257  CHOL 211*  HDL 114  LDLCALC 80  TRIG 84  CHOLHDL 1.9   Thyroid Function Tests: No results for input(s): "TSH", "T4TOTAL", "FREET4", "T3FREE", "THYROIDAB" in the last 72 hours. Anemia Panel: No  results for input(s): "VITAMINB12", "FOLATE", "FERRITIN", "TIBC", "IRON", "RETICCTPCT" in the last 72 hours. Sepsis Labs: Recent Labs  Lab 02/20/23 1100  PROCALCITON <0.10    No results found for this or any previous visit (from the past 240 hours).       Radiology Studies: EEG adult Result Date: 02/20/2023 George Quest, MD     02/20/2023  5:26 PM Patient Name: George Gomez MRN: [id number] Epilepsy Attending: Charlsie Gomez Referring Physician/Provider: Milon Dikes, MD Date: 02/20/2023 Duration: 28.52 mins Patient history:  63 y.o. male past medical history as above brought in for concern for strokelike symptoms when he was noted to be walking into a friend's house with gait difficulty, right-sided weakness and right facial weakness. The facial weakness and symptoms of right-sided weakness rapidly improved but he still remains confused as to where he is, how old he is etc. EEG to evaluate for seizure Level of alertness: Awake AEDs during EEG study: None Technical aspects: This EEG study was done with scalp electrodes positioned according to the 10-20 International system of electrode placement. Electrical activity was reviewed with band pass filter of 1-70Hz , sensitivity of 7 uV/mm, display speed of 5mm/sec with a 60Hz  notched filter applied as appropriate. EEG data were recorded continuously and digitally stored.  Video monitoring was available and reviewed as appropriate. Description: The posterior dominant rhythm consists of 8-9 Hz activity of moderate voltage (25-35 uV) seen predominantly in posterior head regions, symmetric and reactive to eye opening and eye closing. Hyperventilation and photic stimulation were not performed.   Study was technically difficult due to significant myogenic artifact. IMPRESSION: This technically difficult study is within normal limits. No seizures or epileptiform discharges were seen throughout the recording. A normal interictal EEG does not exclude the  diagnosis of epilepsy. George Gomez   MR BRAIN WO CONTRAST Result Date: 02/20/2023 CLINICAL DATA:  Stroke suspected, unsteady gait and slurred speech EXAM: MRI HEAD WITHOUT CONTRAST TECHNIQUE: Multiplanar, multiecho pulse sequences of the brain and surrounding structures were obtained without intravenous contrast. COMPARISON:  No prior MRI available, correlation is made with 02/20/2023 CT head FINDINGS: Brain: No restricted diffusion to suggest acute or subacute infarct. No acute hemorrhage, mass, mass effect, or midline shift. No hydrocephalus or extra-axial collection. Pituitary and craniocervical junction within normal limits. No hemosiderin deposition to suggest remote hemorrhage. Confluent T2 hyperintense signal in the periventricular white matter, likely the sequela of moderate to severe chronic small vessel ischemic disease for age. Remote lacunar infarct in the posterior left lentiform nucleus. No evidence of remote cortical infarct. Mildly advanced cerebral atrophy for age. Vascular: Normal arterial flow voids. Skull and upper cervical spine: Normal marrow signal. Sinuses/Orbits: Mucosal thickening in the maxillary sinuses and ethmoid air cells. No acute finding in the orbits. Other:  The mastoid air cells are well aerated. IMPRESSION: No acute intracranial process. No evidence of acute or subacute infarct. Electronically Signed   By: Wiliam Ke M.D.   On: 02/20/2023 14:54   CT ANGIO HEAD NECK W WO CM W PERF (CODE STROKE) Result Date: 02/20/2023 CLINICAL DATA:  Provided history: Neuro deficit, acute, stroke suspected. Additional history provided: Right-sided weakness. EXAM: CT ANGIOGRAPHY HEAD AND NECK CT PERFUSION HEAD TECHNIQUE: Multidetector CT imaging of the head and neck was performed using the standard protocol during bolus administration of intravenous contrast. Multiplanar CT image reconstructions and MIPs were obtained to evaluate the vascular anatomy. Carotid stenosis measurements  (when applicable) are obtained utilizing NASCET criteria, using the distal internal carotid diameter as the denominator. Multiphase CT imaging of the brain was performed following IV bolus contrast injection. Subsequent parametric perfusion maps were calculated using RAPID software. RADIATION DOSE REDUCTION: This exam was performed according to the departmental dose-optimization program which includes automated exposure control, adjustment of the mA and/or kV according to patient size and/or use of iterative reconstruction technique. CONTRAST:  OMNIPAQUE IOHEXOL 350 MG/ML SOLN COMPARISON:  Non-contrast head CT 02/20/2023. FINDINGS: CTA NECK FINDINGS Aortic arch: Standard aortic branching. Atherosclerotic plaque within the visualized thoracic aorta and proximal major branch vessels of the neck. No hemodynamically significant innominate or proximal subclavian artery stenosis. Right carotid system: CCA and ICA patent within the neck. Calcified atherosclerotic plaque within the proximal ICA resulting in less than 50% stenosis. Left carotid system: CCA and ICA patent within the neck without stenosis or significant atherosclerotic disease. Vertebral arteries: Patent within the neck without stenosis or significant atherosclerotic disease. The left vertebral artery is slightly dominant. Skeleton: Spondylosis at the cervical and visualized upper thoracic levels. No acute fracture or aggressive osseous lesion. Other neck: No neck mass or cervical lymphadenopathy. Upper chest: No consolidation within the imaged lung apices. Review of the MIP images confirms the above findings CTA HEAD FINDINGS Anterior circulation: The intracranial internal carotid arteries are patent. Nonstenotic atherosclerotic plaque within both vessels The M1 middle cerebral arteries are patent. No M2 proximal branch occlusion or high-grade proximal stenosis. The anterior cerebral arteries are patent. No intracranial aneurysm is identified. Posterior  circulation: The intracranial vertebral arteries are patent. The basilar artery is patent. The posterior cerebral arteries are patent. Posterior communicating arteries are present bilaterally. Venous sinuses: Limited assessment for dural venous sinus thrombosis given contrast timing. Anatomic variants: None significant. Review of the MIP images confirms the above findings CT Brain Perfusion Findings: CBF (<30%) Volume: 0mL Perfusion (Tmax>6.0s) volume: 8mL Mismatch Volume: 0mL Infarction Location:None identified. No emergent large vessel occlusion identified. These results were called by telephone at the time of interpretation on 02/20/2023 at 11:20 am to provider [facility] , who verbally acknowledged these results. IMPRESSION: CTA neck: 1. The common carotid and internal carotid arteries are patent within the neck. Calcified plaque within the proximal right ICA resulting in less than 50% stenosis. 2. The vertebral arteries are patent within the neck without stenosis or significant atherosclerotic disease. 3. Aortic Atherosclerosis (ICD10-I70.0). CTA head: 1. No proximal intracranial large vessel occlusion or high-grade proximal arterial stenosis identified. 2. Non-stenotic atherosclerotic plaque within the intracranial ICAs. CT perfusion head: The perfusion software identifies no core infarct. The perfusion software identifies an 8 mL region of hypoperfused parenchyma within the inferior cerebellum bilaterally (utilizing the Tmax>6 seconds threshold). Reported mismatch volume: 8 mL. Electronically Signed   By: Jackey Loge D.O.   On: 02/20/2023 11:49  CT HEAD CODE STROKE WO CONTRAST Result Date: 02/20/2023 CLINICAL DATA:  Code stroke. Provided history: Neuro deficit, acute, stroke suspected. EXAM: CT HEAD WITHOUT CONTRAST TECHNIQUE: Contiguous axial images were obtained from the base of the skull through the vertex without intravenous contrast. RADIATION DOSE REDUCTION: This exam was performed according to  the departmental dose-optimization program which includes automated exposure control, adjustment of the mA and/or kV according to patient size and/or use of iterative reconstruction technique. COMPARISON:  Head CT 04/11/2019. FINDINGS: Brain: Generalized cerebral and cerebellar volume loss. Small lacunar infarct within the left thalamus, new from the prior head CT of 04/11/2019 but otherwise age-indeterminate (series 3, image 16). Chronic lacunar infarcts again noted within/about the bilateral basal ganglia. Background moderate patchy and ill-defined hypoattenuation within the cerebral white matter, nonspecific but compatible with chronic small vessel ischemic disease. There is no acute intracranial hemorrhage. No demarcated cortical infarct. No extra-axial fluid collection. No evidence of an intracranial mass. No midline shift. Vascular: No hyperdense vessel.  Atherosclerotic calcifications. Skull: No calvarial fracture or aggressive osseous lesion. Sinuses/Orbits: No mass or acute finding within the imaged orbits. Mild mucosal thickening within the right ethmoid and bilateral maxillary sinuses at the imaged levels. ASPECTS (Alberta Stroke Program Early CT Score) - Ganglionic level infarction (caudate, lentiform nuclei, internal capsule, insula, M1-M3 cortex): 7 - Supraganglionic infarction (M4-M6 cortex): 3 Total score (0-10 with 10 being normal): 10 These results were communicated to Dr. Wilford Corner at 11:25 amon 1/24/2025by text page via the [facility] messaging system. IMPRESSION: 1. Small lacunar infarct within the left thalamus, new from the prior head CT of 04/11/2019 but otherwise age-indeterminate. A brain MRI may be obtained for further evaluation, as clinically warranted. 2. No acute intracranial hemorrhage or acute demarcated cortical infarct. 3. Background parenchymal atrophy, chronic small vessel ischemic disease and chronic lacunar infarcts, as described. Electronically Signed   By: Jackey Loge D.O.   On:  02/20/2023 11:25        Scheduled Meds:   stroke: early stages of recovery book   Does not apply Once   folic acid  1 mg Oral Daily   insulin aspart  0-15 Units Subcutaneous TID WC   insulin aspart  0-5 Units Subcutaneous QHS   multivitamin with minerals  1 tablet Oral Daily   thiamine  100 mg Oral Daily   Or   thiamine  100 mg Intravenous Daily   Continuous Infusions:   LOS: 1 day      Charise Killian, MD [facility] Pager [phone]  If 7PM-7AM, please contact night-coverage [email or web address] 02/21/2023, 8:12 AM

## 2023-02-21 NOTE — Evaluation (Signed)
Speech Language Pathology Evaluation Patient Details Name: Rashid Whitenight MRN: [id number] DOB: 04-08-1960 Today's Date: 02/21/2023 Time: 1130-1155 SLP Time Calculation (min) (ACUTE ONLY): 25 min  Problem List:  Patient Active Problem List   Diagnosis Date Noted   Stroke-like symptom 02/20/2023   Seizure due to alcohol withdrawal (HCC) 02/20/2023   Alcohol withdrawal (HCC) 02/20/2023   Aortic atherosclerosis (HCC) 12/16/2022   Hepatic steatosis 12/16/2022   History of angioedema 12/16/2022   CKD stage 3 due to type 2 diabetes mellitus (HCC) 01/01/2022   Bilateral hand pain 01/01/2022   Right hip pain 01/01/2022   Hypomagnesemia 12/15/2021   Type 2 diabetes mellitus with proteinuria (HCC) 07/09/2021   Hyperlipidemia due to type 2 diabetes mellitus (HCC) 07/19/2020   Hypertension associated with type 2 diabetes mellitus (HCC) 07/19/2020   Chronic gout without tophus 07/19/2020   Gastroesophageal reflux disease 07/19/2020   Type 2 diabetes mellitus with diabetic neuropathy (HCC) 07/19/2020   Eczema 07/19/2020   Elevated LFTs 08/23/2019   B12 deficiency 12/11/2017   Normocytic anemia 12/03/2017   Thrombocytopenia (HCC) 12/03/2017   Alcohol abuse 03/20/2016   Past Medical History:  Past Medical History:  Diagnosis Date   Acute on chronic renal failure (HCC) 03/20/2016   Acute renal failure (ARF) (HCC) 08/28/2016   Acute renal failure (HCC) 10/12/2018   Anemia    B12 deficiency 12/11/2017   Clostridium difficile diarrhea 03/18/2016   Diabetes mellitus without complication (HCC)    Diverticulitis    Pt states diverticulitis   EtOH dependence (HCC)    Folate deficiency 07/25/2018   GI bleed 05/30/2017   Hypercholesteremia    Hypertension    Melena 10/21/2018   Weight loss 09/15/2018   Past Surgical History:  Past Surgical History:  Procedure Laterality Date   COLONOSCOPY WITH PROPOFOL N/A 10/16/2018   Procedure: COLONOSCOPY WITH PROPOFOL;  Surgeon: Toney Reil, MD;   Location: [facility] ENDOSCOPY;  Service: Gastroenterology;  Laterality: N/A;   ENTEROSCOPY N/A 10/16/2018   Procedure: ENTEROSCOPY;  Surgeon: Toney Reil, MD;  Location[facility] ENDOSCOPY;  Service: Gastroenterology;  Laterality: N/A;   ESOPHAGOGASTRODUODENOSCOPY N/A 07/02/2018   Procedure: ESOPHAGOGASTRODUODENOSCOPY (EGD);  Surgeon: Toney Reil, MD;  Location[facility] ENDOSCOPY;  Service: Gastroenterology;  Laterality: N/A;   ESOPHAGOGASTRODUODENOSCOPY (EGD) WITH PROPOFOL N/A 06/01/2017   Procedure: ESOPHAGOGASTRODUODENOSCOPY (EGD) WITH PROPOFOL;  Surgeon: Wyline Mood, MD;  Location: [facility] ENDOSCOPY;  Service: Gastroenterology;  Laterality: N/A;   GIVENS CAPSULE STUDY N/A 10/17/2018   Procedure: GIVENS CAPSULE STUDY;  Surgeon: Toney Reil, MD;  Location: [facility] ENDOSCOPY;  Service: Gastroenterology;  Laterality: N/A;   GIVENS CAPSULE STUDY N/A 08/02/2019   Procedure: GIVENS CAPSULE STUDY;  Surgeon: Toney Reil, MD;  Location[facility] ENDOSCOPY;  Service: Gastroenterology;  Laterality: N/A;   none     HPI:  Per neurology note: Yidel Teuscher is a 63 y.o. male past medical history for hypertension, hyperlipidemia, diabetes, CKD 3 and alcohol abuse brought in for concern for strokelike symptoms due to abnormal gait and right-sided transient weakness noticed by EMTs. His right-sided weakness had resolved but he still was confused and altered at the time of arrival. Left hemispheric TIA versus seizure versus encephalopathy related to alcohol use was in the differentials. CT head, CT angio head and MRI brain unremarkable for acute stroke. EEG is normal.  No witnessed seizures. Recommending CIWA protocol for alcohol withdrawal. Pt passed nursing swallow screen and is now on a regular solids diet   Assessment / Plan / Recommendation Clinical  Impression  Pt seen for cognitive linguistic evaluation in the setting of stroke like symptoms (MRI negative). Assessment consisting of pt interview and  completion of Mini Mental State Examination (MMSE). Score of 25/30 obtained on MMSE falling within the criterion of normal limits. Pt presents with grossly intact cognitive communication ability, with some delayed processing and pt report of "cloudiness" that is improving since admission. Independent orientation, expressive/receptive language, and visual processing demonstrated during assessment. Motor speech grossly intact- oral motor function strong and symmetrical. Suspect continued resolution of cognitive "cloudiness" with time, with consideration of impact of CIWA protocol. Based on continued evolution to baseline, no further SLP services indicated at this time. Education shared regarding follow up with PCP in the setting of maintained cognitive differences upon discharge. Pt reported understanding.    SLP Assessment  SLP Recommendation/Assessment: Patient does not need any further Speech Lanaguage Pathology Services SLP Visit Diagnosis: Cognitive communication deficit (R41.841) (rule out)    Recommendations for follow up therapy are one component of a multi-disciplinary discharge planning process, led by the attending physician.  Recommendations may be updated based on patient status, additional functional criteria and insurance authorization.    Follow Up Recommendations  No SLP follow up    Assistance Recommended at Discharge     Functional Status Assessment Patient has not had a recent decline in their functional status  Frequency and Duration           SLP Evaluation Cognition  Overall Cognitive Status: Within Functional Limits for tasks assessed (25/30 for MMSE-1 point off for date, 3 points off for calculation, 1 point off for lexical recall- grossly [facility])       Comprehension  Auditory Comprehension Overall Auditory Comprehension: Appears within functional limits for tasks assessed Visual Recognition/Discrimination Discrimination:  (squintng at TV- reporting at  baseline) Reading Comprehension Reading Status: Within funtional limits    Expression Expression Primary Mode of Expression: Verbal Verbal Expression Overall Verbal Expression: Appears within functional limits for tasks assessed Written Expression Dominant Hand: Left Written Expression: Within Functional Limits   Oral / Motor  Oral Motor/Sensory Function Overall Oral Motor/Sensory Function: Within functional limits Motor Speech Overall Motor Speech: Appears within functional limits for tasks assessed           [place] Lazara Grieser Clapp  MS [facility] SLP   [place] J Clapp 02/21/2023, 5:57 PM

## 2023-02-22 ENCOUNTER — Encounter: Payer: Self-pay | Admitting: Internal Medicine

## 2023-02-22 ENCOUNTER — Other Ambulatory Visit: Payer: Self-pay

## 2023-02-22 DIAGNOSIS — F1010 Alcohol abuse, uncomplicated: Secondary | ICD-10-CM | POA: Diagnosis not present

## 2023-02-22 LAB — CBC
HCT: 26.4 % — ABNORMAL LOW (ref 39.0–52.0)
Hemoglobin: 8.5 g/dL — ABNORMAL LOW (ref 13.0–17.0)
MCH: 30.8 pg (ref 26.0–34.0)
MCHC: 32.2 g/dL (ref 30.0–36.0)
MCV: 95.7 fL (ref 80.0–100.0)
Platelets: 117 10*3/uL — ABNORMAL LOW (ref 150–400)
RBC: 2.76 MIL/uL — ABNORMAL LOW (ref 4.22–5.81)
RDW: 17 % — ABNORMAL HIGH (ref 11.5–15.5)
WBC: 4.2 10*3/uL (ref 4.0–10.5)
nRBC: 0.5 % — ABNORMAL HIGH (ref 0.0–0.2)

## 2023-02-22 LAB — GLUCOSE, CAPILLARY
Glucose-Capillary: 87 mg/dL (ref 70–99)
Glucose-Capillary: 112 mg/dL — ABNORMAL HIGH (ref 70–99)

## 2023-02-22 LAB — COMPREHENSIVE METABOLIC PANEL
ALT: 14 U/L (ref 0–44)
AST: 37 U/L (ref 15–41)
Albumin: 3.6 g/dL (ref 3.5–5.0)
Alkaline Phosphatase: 80 U/L (ref 38–126)
Anion gap: 14 (ref 5–15)
BUN: 14 mg/dL (ref 8–23)
CO2: 20 mmol/L — ABNORMAL LOW (ref 22–32)
Calcium: 8.3 mg/dL — ABNORMAL LOW (ref 8.9–10.3)
Chloride: 100 mmol/L (ref 98–111)
Creatinine, Ser: 0.91 mg/dL (ref 0.61–1.24)
GFR, Estimated: >60 mL/min (ref >60)
Glucose, Bld: 90 mg/dL (ref 70–99)
Potassium: 3.3 mmol/L — ABNORMAL LOW (ref 3.5–5.1)
Sodium: 134 mmol/L — ABNORMAL LOW (ref 135–145)
Total Bilirubin: 1.5 mg/dL — ABNORMAL HIGH (ref 0.0–1.2)
Total Protein: 6.9 g/dL (ref 6.5–8.1)

## 2023-02-22 LAB — ECHOCARDIOGRAM COMPLETE
AR max vel: 2.78 cm2
AV Peak grad: 4.8 mm[Hg]
Ao pk vel: 1.09 m/s
Area-P 1/2: 4.74 cm2
S' Lateral: 3.2 cm

## 2023-02-22 LAB — MAGNESIUM: Magnesium: 1.6 mg/dL — ABNORMAL LOW (ref 1.7–2.4)

## 2023-02-22 MED ORDER — ATORVASTATIN CALCIUM 20 MG PO TABS
40.0000 mg | ORAL_TABLET | Freq: Every day | ORAL | Status: DC
  Administered 2023-02-22: 40 mg via ORAL
  Filled 2023-02-22: qty 2

## 2023-02-22 MED ORDER — POTASSIUM CHLORIDE CRYS ER 20 MEQ PO TBCR
40.0000 meq | EXTENDED_RELEASE_TABLET | Freq: Once | ORAL | Status: AC
  Administered 2023-02-22: 40 meq via ORAL
  Filled 2023-02-22: qty 2

## 2023-02-22 MED ORDER — MAGNESIUM SULFATE 2 GM/50ML IV SOLN
2.0000 g | Freq: Once | INTRAVENOUS | Status: AC
  Administered 2023-02-22: 2 g via INTRAVENOUS
  Filled 2023-02-22: qty 50

## 2023-02-22 NOTE — TOC Transition Note (Signed)
Transition of Care [facility]) - Discharge Note   Patient Details  Name: George Gomez MRN: [id number] Date of Birth: 1960-06-29  Transition of Care [facility] CM/SW Contact:  Luvenia Redden, RN Phone Number: 02/22/2023, 2:13 PM   Clinical Narrative:    [facility] RN spoke directly with pt for HHPT. Pt in route of discharged-TOC RN spoke with pt and offered arrangements for [facility]. Pt opt to declined arrangements for [facility].   No additional needs at this time.  Final next level of care: Home w Home Health Services (pt declined HHPT)     Patient Goals and CMS Choice     Choice offered to / list presented to : Patient      Discharge Placement                    Patient and family notified of of transfer: 02/22/23 [facility] RN spoke directly with pt who declined arrangements for HHPT.)  Discharge Plan and Services Additional resources added to the After Visit Summary for                                       Social Drivers of Health (SDOH) Interventions SDOH Screenings   Food Insecurity: No Food Insecurity (02/22/2023)  Housing: Low Risk  (02/22/2023)  Transportation Needs: No Transportation Needs (02/22/2023)  Utilities: Not At Risk (02/22/2023)  Alcohol Screen: High Risk (02/17/2023)  Depression (PHQ2-9): Low Risk  (02/17/2023)  Financial Resource Strain: Low Risk  (02/17/2023)  Physical Activity: Inactive (02/17/2023)  Social Connections: Socially Isolated (02/22/2023)  Stress: No Stress Concern Present (02/17/2023)  Tobacco Use: Low Risk  (02/17/2023)  Health Literacy: Adequate Health Literacy (02/17/2023)     Readmission Risk Interventions     No data to display

## 2023-02-22 NOTE — Discharge Summary (Signed)
Physician Discharge Summary  Apolo Cutshaw ZOX:[id number] DOB: 1960/04/23 DOA: 02/20/2023  PCP: Marjie Skiff, NP  Admit date: 02/20/2023 Discharge date: 02/22/2023  Admitted From: home  Disposition:  home   Recommendations for Outpatient Follow-up:  Follow up with PCP in 1-2 weeks F/u w/ neuro, Dr. Sherryll Burger or Dr. Malvin Johns, in 8-12 weeks  Home Health: yes Equipment/Devices:   Discharge Condition: stable  CODE STATUS: full  Diet recommendation: Carb Modified   Brief/Interim Summary: HPI was taken from Dr. Sedalia Muta: Mr. Rip Hawes is a 63 year old male with history of alcohol use disorder, alcohol dependence, hypertension, history of CVA in the left lacunar, presents emergency department for chief concerns of right-sided facial droop.   Vitals in the ED showed temperature 97.7, respiration rate 21, heart rate of 110, blood pressure 144/99, SpO2 98% on room air.   Serum sodium is 140, potassium 4.0, chloride 102, bicarb 11, BUN of 8, serum creatinine of 0.97, EGFR greater than 60, nonfasting blood glucose 119, WBC 4.9, hemoglobin 9.1, platelets of 145.   Code stroke was called upon patient arrival to the ED.  Neurology has been consulted.   ED treatment: None. ------------------------------- At bedside, patient able to tell me his first and last name, age, location, current calendar year.  He first told me it was 2024 and then corrected and states is 25.   He knows he is in the hospital.   He reports his last drink was 1 cup of vodka yesterday on 02/19/2023.  He did not have any vodka on 02/18/2023 due to not feeling well.   Patient reports he normally drinks about 1 cup of vodka per day, about 8 to 10 ounces.  He sometimes makes with cranberry juice.   He denies trauma to his person.  He denies nausea, vomiting, dysuria, hematuria.  He reports he has been having diarrhea for the last 2 days, 2 episodes each day, brown watery stool.  He denies recent antibiotic use.  Discharge  Diagnoses:  Principal Problem:   Stroke-like symptom Active Problems:   Alcohol abuse   Thrombocytopenia (HCC)   Elevated LFTs   Hyperlipidemia due to type 2 diabetes mellitus (HCC)   Hypertension associated with type 2 diabetes mellitus (HCC)   Gastroesophageal reflux disease   Type 2 diabetes mellitus with diabetic neuropathy (HCC)   Type 2 diabetes mellitus with proteinuria (HCC)   Hypomagnesemia   Hepatic steatosis   Seizure due to alcohol withdrawal (HCC)   Alcohol withdrawal (HCC) Stroke-like symptom: but CVA r/o w/ MRI brain. Echo shows EF 60-65%, grade I diastolic dysfunction, no regional wall motion abnormalities, no atrial shunts. Neuro recs apprec    Seizure: due to alcohol withdrawal. Continue on CIWA protocol. Alcohol level 25. No need for seizure medication at this time.    Hypomagnesemia: mag sulfate given    DM2: well controlled, HbA1c 5.4. Continue on SSI w/ accuchekcs   HTN: continue on home dose of propranolol    Transaminitis: resolved    Thrombocytopenia: likely secondary to alcohol abuse.    Alcohol abuse: continue on CIWA protocol. Alcohol level 25. Drinks 3 shots of 3 liquor daily. Received alcohol cessation counseling    Discharge Instructions  Discharge Instructions     Diet Carb Modified   Complete by: As directed    Discharge instructions   Complete by: As directed    F/u w/ PCP in 1-2 weeks. F/u w/ neuro, Dr. Sherryll Burger or Dr. Malvin Johns, in 8-12 weeks   Increase activity slowly  Complete by: As directed       Allergies as of 02/22/2023       Reactions   Lisinopril Swelling   Losartan Swelling        Medication List     TAKE these medications    allopurinol 300 MG tablet Commonly known as: ZYLOPRIM Take 1 tablet (300 mg total) by mouth daily.   atorvastatin 40 MG tablet Commonly known as: LIPITOR Take 1 tablet (40 mg total) by mouth daily.   Cholecalciferol 50 MCG (2000 UT) Caps Take 1 capsule by mouth daily.   cyanocobalamin  1000 MCG tablet Commonly known as: VITAMIN B12 Take 1 tablet (1,000 mcg total) by mouth daily.   gabapentin 300 MG capsule Commonly known as: NEURONTIN Take one tablet (300 MG) by mouth in the morning, then take one tablet (300 MG) by mouth at noon, and then take 600 MG (two tablets) by mouth before bedtime. What changed: additional instructions   magnesium oxide 400 (240 Mg) MG tablet Commonly known as: MAG-OX Take 1 tablet (400 mg total) by mouth 2 (two) times daily.   metFORMIN 500 MG 24 hr tablet Commonly known as: GLUCOPHAGE-XR Take 1 tablet (500 mg total) by mouth daily.   ondansetron 4 MG tablet Commonly known as: Zofran Take 1 tablet (4 mg total) by mouth every 8 (eight) hours as needed.   onetouch ultrasoft lancets Use to check blood sugar 3 times a day and document results, bring to appointments.  Goal is <130 fasting blood sugar and <180 two hours after meals.   OneTouch Verio test strip Generic drug: glucose blood Use to check blood sugar 3 times a day and document results, bring to appointments.  Goal is <130 fasting blood sugar and <180 two hours after meals.   OneTouch Verio w/Device Kit Use to check blood sugar 3 times a day and document results, bring to appointments.  Goal is <130 fasting blood sugar and <180 two hours after meals.   pantoprazole 40 MG tablet Commonly known as: PROTONIX Take 1 tablet (40 mg total) by mouth daily.   potassium chloride 10 MEQ tablet Commonly known as: KLOR-CON M Take 2 tablets (20 mEq total) by mouth daily for 5 days.   propranolol ER 60 MG 24 hr capsule Commonly known as: INDERAL LA Take 1 capsule (60 mg total) by mouth daily.   tiZANidine 2 MG tablet Commonly known as: ZANAFLEX TAKE 1 TABLET(2 MG) BY MOUTH EVERY 8 HOURS AS NEEDED FOR MUSCLE SPASMS   triamcinolone cream 0.1 % Commonly known as: KENALOG Apply 1 application topically 2 (two) times daily.   Voltaren 1 % Gel Generic drug: diclofenac Sodium Apply 4  gram to affected area four times a day as needed for pain for knee pain. Do not exceed 16gm for LE joint per day        Allergies  Allergen Reactions   Lisinopril Swelling   Losartan Swelling    Consultations: Neuro    Procedures/Studies: ECHOCARDIOGRAM COMPLETE Result Date: 02/22/2023    ECHOCARDIOGRAM REPORT   Patient Name:   ELSIE SAKUMA Date of Exam: 02/21/2023 Medical Rec #:  [id number]      Height:       71.0 in Accession #:    [id number]     Weight:       193.0 lb Date of Birth:  1960-08-03       BSA:          2.077 m Patient Age:  62 years       BP:           133/81 mmHg Patient Gender: M              HR:           95 bpm. Exam Location:  [facility] Procedure: 2D Echo Indications:     Stroke I63.9  History:         Patient has prior history of Echocardiogram examinations, most                  recent 06/10/2018.  Sonographer:     Overton Mam RDCS, FASE Referring Phys:  [id number] AMY N COX Diagnosing Phys: Chilton Si MD IMPRESSIONS  1. Left ventricular ejection fraction, by estimation, is 60 to 65%. The left ventricle has normal function. The left ventricle has no regional wall motion abnormalities. There is mild concentric left ventricular hypertrophy. Left ventricular diastolic parameters are consistent with Grade I diastolic dysfunction (impaired relaxation).  2. Right ventricular systolic function is normal. The right ventricular size is normal. There is normal pulmonary artery systolic pressure.  3. The mitral valve is normal in structure. No evidence of mitral valve regurgitation. No evidence of mitral stenosis.  4. The aortic valve is normal in structure. Aortic valve regurgitation is not visualized. No aortic stenosis is present.  5. The inferior vena cava is normal in size with greater than 50% respiratory variability, suggesting right atrial pressure of 3 mmHg. FINDINGS  Left Ventricle: Left ventricular ejection fraction, by estimation, is 60 to 65%. The left ventricle has  normal function. The left ventricle has no regional wall motion abnormalities. The left ventricular internal cavity size was normal in size. There is  mild concentric left ventricular hypertrophy. Left ventricular diastolic parameters are consistent with Grade I diastolic dysfunction (impaired relaxation). Normal left ventricular filling pressure. Right Ventricle: The right ventricular size is normal. No increase in right ventricular wall thickness. Right ventricular systolic function is normal. There is normal pulmonary artery systolic pressure. The tricuspid regurgitant velocity is 2.61 m/s, and  with an assumed right atrial pressure of 3 mmHg, the estimated right ventricular systolic pressure is 30.2 mmHg. Left Atrium: Left atrial size was normal in size. Right Atrium: Right atrial size was normal in size. Pericardium: There is no evidence of pericardial effusion. Mitral Valve: The mitral valve is normal in structure. No evidence of mitral valve regurgitation. No evidence of mitral valve stenosis. Tricuspid Valve: The tricuspid valve is normal in structure. Tricuspid valve regurgitation is not demonstrated. No evidence of tricuspid stenosis. Aortic Valve: The aortic valve is normal in structure. Aortic valve regurgitation is not visualized. No aortic stenosis is present. Aortic valve peak gradient measures 4.8 mmHg. Pulmonic Valve: The pulmonic valve was normal in structure. Pulmonic valve regurgitation is not visualized. No evidence of pulmonic stenosis. Aorta: The aortic root is normal in size and structure. Venous: The inferior vena cava is normal in size with greater than 50% respiratory variability, suggesting right atrial pressure of 3 mmHg. IAS/Shunts: No atrial level shunt detected by color flow Doppler.  LEFT VENTRICLE PLAX 2D LVIDd:         4.50 cm   Diastology LVIDs:         3.20 cm   LV e' medial:    6.64 cm/s LV PW:         1.20 cm   LV E/e' medial:  8.4 LV IVS:  1.10 cm   LV e' lateral:   7.51  cm/s LVOT diam:     2.00 cm   LV E/e' lateral: 7.4 LV SV:         50 LV SV Index:   24 LVOT Area:     3.14 cm  RIGHT VENTRICLE RV Basal diam:  2.10 cm RV S prime:     9.98 cm/s TAPSE (M-mode): 1.6 cm LEFT ATRIUM             Index        RIGHT ATRIUM          Index LA diam:        3.70 cm 1.78 cm/m   RA Area:     8.28 cm LA Vol (A2C):   22.1 ml 10.64 ml/m  RA Volume:   13.30 ml 6.40 ml/m LA Vol (A4C):   27.1 ml 13.05 ml/m LA Biplane Vol: 25.5 ml 12.28 ml/m  AORTIC VALVE                 PULMONIC VALVE AV Area (Vmax): 2.78 cm     PV Vmax:        1.14 m/s AV Vmax:        109.00 cm/s  PV Peak grad:   5.2 mmHg AV Peak Grad:   4.8 mmHg     RVOT Peak grad: 3 mmHg LVOT Vmax:      96.60 cm/s LVOT Vmean:     67.100 cm/s LVOT VTI:       0.158 m  AORTA Ao Root diam: 3.30 cm Ao Asc diam:  2.90 cm MITRAL VALVE               TRICUSPID VALVE MV Area (PHT): 4.74 cm    TR Peak grad:   27.2 mmHg MV Decel Time: 160 msec    TR Vmax:        261.00 cm/s MV E velocity: 55.50 cm/s MV A velocity: 73.40 cm/s  SHUNTS MV E/A ratio:  0.76        Systemic VTI:  0.16 m                            Systemic Diam: 2.00 cm Chilton Si MD Electronically signed by Chilton Si MD Signature Date/Time: 02/22/2023/10:50:20 AM    Final    EEG adult Result Date: 02/20/2023 Charlsie Quest, MD     02/20/2023  5:26 PM Patient Name: Norwin Aleman MRN: [id number] Epilepsy Attending: Charlsie Quest Referring Physician/Provider: Milon Dikes, MD Date: 02/20/2023 Duration: 28.52 mins Patient history:  63 y.o. male past medical history as above brought in for concern for strokelike symptoms when he was noted to be walking into a friend's house with gait difficulty, right-sided weakness and right facial weakness. The facial weakness and symptoms of right-sided weakness rapidly improved but he still remains confused as to where he is, how old he is etc. EEG to evaluate for seizure Level of alertness: Awake AEDs during EEG study: None Technical  aspects: This EEG study was done with scalp electrodes positioned according to the 10-20 International system of electrode placement. Electrical activity was reviewed with band pass filter of 1-70Hz , sensitivity of 7 uV/mm, display speed of 27mm/sec with a 60Hz  notched filter applied as appropriate. EEG data were recorded continuously and digitally stored.  Video monitoring was available and reviewed as appropriate. Description: The posterior dominant rhythm consists of 8-9 Hz  activity of moderate voltage (25-35 uV) seen predominantly in posterior head regions, symmetric and reactive to eye opening and eye closing. Hyperventilation and photic stimulation were not performed.   Study was technically difficult due to significant myogenic artifact. IMPRESSION: This technically difficult study is within normal limits. No seizures or epileptiform discharges were seen throughout the recording. A normal interictal EEG does not exclude the diagnosis of epilepsy. Charlsie Quest   MR BRAIN WO CONTRAST Result Date: 02/20/2023 CLINICAL DATA:  Stroke suspected, unsteady gait and slurred speech EXAM: MRI HEAD WITHOUT CONTRAST TECHNIQUE: Multiplanar, multiecho pulse sequences of the brain and surrounding structures were obtained without intravenous contrast. COMPARISON:  No prior MRI available, correlation is made with 02/20/2023 CT head FINDINGS: Brain: No restricted diffusion to suggest acute or subacute infarct. No acute hemorrhage, mass, mass effect, or midline shift. No hydrocephalus or extra-axial collection. Pituitary and craniocervical junction within normal limits. No hemosiderin deposition to suggest remote hemorrhage. Confluent T2 hyperintense signal in the periventricular white matter, likely the sequela of moderate to severe chronic small vessel ischemic disease for age. Remote lacunar infarct in the posterior left lentiform nucleus. No evidence of remote cortical infarct. Mildly advanced cerebral atrophy for age.  Vascular: Normal arterial flow voids. Skull and upper cervical spine: Normal marrow signal. Sinuses/Orbits: Mucosal thickening in the maxillary sinuses and ethmoid air cells. No acute finding in the orbits. Other: The mastoid air cells are well aerated. IMPRESSION: No acute intracranial process. No evidence of acute or subacute infarct. Electronically Signed   By: Wiliam Ke M.D.   On: 02/20/2023 14:54   CT ANGIO HEAD NECK W WO CM W PERF (CODE STROKE) Result Date: 02/20/2023 CLINICAL DATA:  Provided history: Neuro deficit, acute, stroke suspected. Additional history provided: Right-sided weakness. EXAM: CT ANGIOGRAPHY HEAD AND NECK CT PERFUSION HEAD TECHNIQUE: Multidetector CT imaging of the head and neck was performed using the standard protocol during bolus administration of intravenous contrast. Multiplanar CT image reconstructions and MIPs were obtained to evaluate the vascular anatomy. Carotid stenosis measurements (when applicable) are obtained utilizing NASCET criteria, using the distal internal carotid diameter as the denominator. Multiphase CT imaging of the brain was performed following IV bolus contrast injection. Subsequent parametric perfusion maps were calculated using RAPID software. RADIATION DOSE REDUCTION: This exam was performed according to the departmental dose-optimization program which includes automated exposure control, adjustment of the mA and/or kV according to patient size and/or use of iterative reconstruction technique. CONTRAST:  OMNIPAQUE IOHEXOL 350 MG/ML SOLN COMPARISON:  Non-contrast head CT 02/20/2023. FINDINGS: CTA NECK FINDINGS Aortic arch: Standard aortic branching. Atherosclerotic plaque within the visualized thoracic aorta and proximal major branch vessels of the neck. No hemodynamically significant innominate or proximal subclavian artery stenosis. Right carotid system: CCA and ICA patent within the neck. Calcified atherosclerotic plaque within the proximal ICA  resulting in less than 50% stenosis. Left carotid system: CCA and ICA patent within the neck without stenosis or significant atherosclerotic disease. Vertebral arteries: Patent within the neck without stenosis or significant atherosclerotic disease. The left vertebral artery is slightly dominant. Skeleton: Spondylosis at the cervical and visualized upper thoracic levels. No acute fracture or aggressive osseous lesion. Other neck: No neck mass or cervical lymphadenopathy. Upper chest: No consolidation within the imaged lung apices. Review of the MIP images confirms the above findings CTA HEAD FINDINGS Anterior circulation: The intracranial internal carotid arteries are patent. Nonstenotic atherosclerotic plaque within both vessels The M1 middle cerebral arteries are patent. No M2 proximal branch  occlusion or high-grade proximal stenosis. The anterior cerebral arteries are patent. No intracranial aneurysm is identified. Posterior circulation: The intracranial vertebral arteries are patent. The basilar artery is patent. The posterior cerebral arteries are patent. Posterior communicating arteries are present bilaterally. Venous sinuses: Limited assessment for dural venous sinus thrombosis given contrast timing. Anatomic variants: None significant. Review of the MIP images confirms the above findings CT Brain Perfusion Findings: CBF (<30%) Volume: 0mL Perfusion (Tmax>6.0s) volume: 8mL Mismatch Volume: 0mL Infarction Location:None identified. No emergent large vessel occlusion identified. These results were called by telephone at the time of interpretation on 02/20/2023 at 11:20 am to provider [facility] , who verbally acknowledged these results. IMPRESSION: CTA neck: 1. The common carotid and internal carotid arteries are patent within the neck. Calcified plaque within the proximal right ICA resulting in less than 50% stenosis. 2. The vertebral arteries are patent within the neck without stenosis or significant  atherosclerotic disease. 3. Aortic Atherosclerosis (ICD10-I70.0). CTA head: 1. No proximal intracranial large vessel occlusion or high-grade proximal arterial stenosis identified. 2. Non-stenotic atherosclerotic plaque within the intracranial ICAs. CT perfusion head: The perfusion software identifies no core infarct. The perfusion software identifies an 8 mL region of hypoperfused parenchyma within the inferior cerebellum bilaterally (utilizing the Tmax>6 seconds threshold). Reported mismatch volume: 8 mL. Electronically Signed   By: Jackey Loge D.O.   On: 02/20/2023 11:49   CT HEAD CODE STROKE WO CONTRAST Result Date: 02/20/2023 CLINICAL DATA:  Code stroke. Provided history: Neuro deficit, acute, stroke suspected. EXAM: CT HEAD WITHOUT CONTRAST TECHNIQUE: Contiguous axial images were obtained from the base of the skull through the vertex without intravenous contrast. RADIATION DOSE REDUCTION: This exam was performed according to the departmental dose-optimization program which includes automated exposure control, adjustment of the mA and/or kV according to patient size and/or use of iterative reconstruction technique. COMPARISON:  Head CT 04/11/2019. FINDINGS: Brain: Generalized cerebral and cerebellar volume loss. Small lacunar infarct within the left thalamus, new from the prior head CT of 04/11/2019 but otherwise age-indeterminate (series 3, image 16). Chronic lacunar infarcts again noted within/about the bilateral basal ganglia. Background moderate patchy and ill-defined hypoattenuation within the cerebral white matter, nonspecific but compatible with chronic small vessel ischemic disease. There is no acute intracranial hemorrhage. No demarcated cortical infarct. No extra-axial fluid collection. No evidence of an intracranial mass. No midline shift. Vascular: No hyperdense vessel.  Atherosclerotic calcifications. Skull: No calvarial fracture or aggressive osseous lesion. Sinuses/Orbits: No mass or acute  finding within the imaged orbits. Mild mucosal thickening within the right ethmoid and bilateral maxillary sinuses at the imaged levels. ASPECTS (Alberta Stroke Program Early CT Score) - Ganglionic level infarction (caudate, lentiform nuclei, internal capsule, insula, M1-M3 cortex): 7 - Supraganglionic infarction (M4-M6 cortex): 3 Total score (0-10 with 10 being normal): 10 These results were communicated to Dr. Wilford Corner at 11:25 amon 1/24/2025by text page via the [facility] messaging system. IMPRESSION: 1. Small lacunar infarct within the left thalamus, new from the prior head CT of 04/11/2019 but otherwise age-indeterminate. A brain MRI may be obtained for further evaluation, as clinically warranted. 2. No acute intracranial hemorrhage or acute demarcated cortical infarct. 3. Background parenchymal atrophy, chronic small vessel ischemic disease and chronic lacunar infarcts, as described. Electronically Signed   By: Jackey Loge D.O.   On: 02/20/2023 11:25   (Echo, Carotid, EGD, Colonoscopy, ERCP)    Subjective: Pt c/o fatigue    Discharge Exam: Vitals:   02/22/23 0822 02/22/23 1234  BP: (!) 148/92  112/78  Pulse: 68 70  Resp: 18 17  Temp: 98.1 F (36.7 C) 98.2 F (36.8 C)  SpO2: 100% 100%   Vitals:   02/22/23 0033 02/22/23 0333 02/22/23 0822 02/22/23 1234  BP: (!) 143/88 (!) 145/87 (!) 148/92 112/78  Pulse: 83 78 68 70  Resp: 16  18 17   Temp: 98.2 F (36.8 C) 99.1 F (37.3 C) 98.1 F (36.7 C) 98.2 F (36.8 C)  TempSrc: Oral Oral    SpO2: 97% 98% 100% 100%  Height: 5\' 11"  (1.803 m)       General: Pt is alert, awake, not in acute distress Cardiovascular:  S1/S2 +, no rubs, no gallops Respiratory: CTA bilaterally, no wheezing, no rhonchi Abdominal: Soft, NT, ND, bowel sounds + Extremities: no cyanosis    The results of significant diagnostics from this hospitalization (including imaging, microbiology, ancillary and laboratory) are listed below for reference.     Microbiology: No  results found for this or any previous visit (from the past 240 hours).   Labs: BNP (last 3 results) No results for input(s): "BNP" in the last 8760 hours. Basic Metabolic Panel: Recent Labs  Lab 02/20/23 1100 02/21/23 0257 02/22/23 0338  NA 140  --  134*  K 4.0  --  3.3*  CL 102  --  100  CO2 11*  --  20*  GLUCOSE 119*  --  90  BUN 8  --  14  CREATININE 0.97  --  0.91  CALCIUM 7.9*  --  8.3*  MG 1.1* 1.8 1.6*  PHOS 4.3  --   --    Liver Function Tests: Recent Labs  Lab 02/20/23 1100 02/22/23 0338  AST 111* 37  ALT 19 14  ALKPHOS 94 80  BILITOT 1.2 1.5*  PROT 7.4 6.9  ALBUMIN 3.8 3.6   No results for input(s): "LIPASE", "AMYLASE" in the last 168 hours. No results for input(s): "AMMONIA" in the last 168 hours. CBC: Recent Labs  Lab 02/20/23 1100 02/22/23 0338  WBC 4.9 4.2  NEUTROABS 3.0  --   HGB 9.1* 8.5*  HCT 29.2* 26.4*  MCV 98.6 95.7  PLT 145* 117*   Cardiac Enzymes: No results for input(s): "CKTOTAL", "CKMB", "CKMBINDEX", "TROPONINI" in the last 168 hours. BNP: Invalid input(s): "POCBNP" CBG: Recent Labs  Lab 02/21/23 0806 02/21/23 1550 02/21/23 2107 02/22/23 0823 02/22/23 1235  GLUCAP 68* 87 97 87 112*   D-Dimer No results for input(s): "DDIMER" in the last 72 hours. Hgb A1c Recent Labs    02/21/23 0257  HGBA1C 5.4   Lipid Profile Recent Labs    02/21/23 0257  CHOL 211*  HDL 114  LDLCALC 80  TRIG 84  CHOLHDL 1.9   Thyroid function studies No results for input(s): "TSH", "T4TOTAL", "T3FREE", "THYROIDAB" in the last 72 hours.  Invalid input(s): "FREET3" Anemia work up No results for input(s): "VITAMINB12", "FOLATE", "FERRITIN", "TIBC", "IRON", "RETICCTPCT" in the last 72 hours. Urinalysis    Component Value Date/Time   COLORURINE YELLOW (A) 12/09/2022 1900   APPEARANCEUR CLEAR (A) 12/09/2022 1900   APPEARANCEUR Clear 07/09/2021 0917   LABSPEC >1.046 (H) 12/09/2022 1900   LABSPEC 1.009 09/19/2013 2009   PHURINE 6.0  12/09/2022 1900   GLUCOSEU NEGATIVE 12/09/2022 1900   GLUCOSEU Negative 09/19/2013 2009   HGBUR NEGATIVE 12/09/2022 1900   BILIRUBINUR NEGATIVE 12/09/2022 1900   BILIRUBINUR Negative 07/09/2021 0917   BILIRUBINUR Negative 09/19/2013 2009   KETONESUR 20 (A) 12/09/2022 1900   PROTEINUR NEGATIVE 12/09/2022 1900  NITRITE NEGATIVE 12/09/2022 1900   LEUKOCYTESUR NEGATIVE 12/09/2022 1900   LEUKOCYTESUR Negative 09/19/2013 2009   Sepsis Labs Recent Labs  Lab 02/20/23 1100 02/22/23 0338  WBC 4.9 4.2   Microbiology No results found for this or any previous visit (from the past 240 hours).   Time coordinating discharge: Over 30 minutes  SIGNED:   Charise Killian, MD  [facility] 02/22/2023, 1:14 PM Pager   If 7PM-7AM, please contact night-coverage [email or web address]

## 2023-02-22 NOTE — Plan of Care (Signed)

## 2023-02-23 ENCOUNTER — Telehealth: Payer: Self-pay

## 2023-02-23 NOTE — Transitions of Care (Post Inpatient/ED Visit) (Signed)
   02/23/2023  Name: George Gomez MRN: [id number] DOB: Apr 27, 1960  Today's TOC FU Call Status: Today's TOC FU Call Status:: Unsuccessful Call (1st Attempt) Unsuccessful Call (1st Attempt) Date: 02/23/23  Attempted to reach the patient regarding the most recent Inpatient/ED visit. Patient was called in an Outreach attempt to offer VBCI  30-day TOC program. Pt is eligible for program due to potential risk for readmission and/or high utilization. Unfortunately, I was not able to speak with the patient in regards to recent hospital discharge    Patient's voicemail has  a generic greeting. To maintain HIPAA compliance, left message with VBCI CM contact information and a request for a call back .   Follow Up Plan: Additional outreach attempts will be made to reach the patient to complete the Transitions of Care (Post Inpatient/ED visit) call.    Susa Loffler , BSN, RN [facility]   VBCI-Population Health RN Care Manager Direct Dial [phone]  Fax: [phone] Website: [email or web address]

## 2023-02-24 ENCOUNTER — Telehealth: Payer: Self-pay

## 2023-02-24 NOTE — Transitions of Care (Post Inpatient/ED Visit) (Signed)
02/24/2023  Name: George Gomez MRN: [id number] DOB: [birth year]  Today's TOC FU Call Status: Today's TOC FU Call Status:: Successful TOC FU Call Completed Unsuccessful Call (1st Attempt) Date: 02/23/23 [facility] FU Call Complete Date: 02/24/23 Patient's Name and Date of Birth confirmed.  Transition Care Management Follow-up Telephone Call Date of Discharge: 02/22/23 Discharge Facility: [facility]) Type of Discharge: Inpatient Admission Primary Inpatient Discharge Diagnosis:: Stroke-like symptom How have you been since you were released from the hospital?: Same Any questions or concerns?: Yes Patient Questions/Concerns:: Feels he needs Home Health Nursing , PT and OT Patient Questions/Concerns Addressed: Notified Provider of Patient Questions/Concerns  Items Reviewed: Did you receive and understand the discharge instructions provided?: Yes (He states he did not review it) Medications obtained,verified, and reconciled?: Yes (Medications Reviewed) Any new allergies since your discharge?: No Dietary orders reviewed?: Yes Type of Diet Ordered:: Reg Heart Healthy Carb Modified Do you have support at home?: No  Medications Reviewed Today: Medications Reviewed Today     Reviewed by Johnnette Barrios, RN (Registered Nurse) on 02/24/23 at 1504  Med List Status: <None>   Medication Order Taking? Sig Documenting Provider Last Dose Status Informant  allopurinol (ZYLOPRIM) 300 MG tablet [id number] Yes Take 1 tablet (300 mg total) by mouth daily. Marjie Skiff, NP Taking Active Self, Pharmacy Records  atorvastatin (LIPITOR) 40 MG tablet [id number] Yes Take 1 tablet (40 mg total) by mouth daily. Marjie Skiff, NP Taking Active Self, Pharmacy Records  Blood Glucose Monitoring Suppl [facility] VERIO) w/Device Andria Rhein [id number] Yes Use to check blood sugar 3 times a day and document results, bring to appointments.  Goal is <130 fasting blood sugar and <180 two hours after  meals. Marjie Skiff, NP Taking Active Self, Pharmacy Records           Med Note (Krystena Reitter L   Tue Feb 24, 2023  3:03 PM) Has but does not use   Cholecalciferol 50 MCG (2000 UT) CAPS [id number] No Take 1 capsule by mouth daily.  Patient not taking: Reported on 02/17/2023   [provider] Not Taking Active Self, Pharmacy Records  cyanocobalamin (VITAMIN B12) 1000 MCG tablet [id number] No Take 1 tablet (1,000 mcg total) by mouth daily.  Patient not taking: Reported on 02/17/2023   Marjie Skiff, NP Not Taking Active Self, Pharmacy Records  gabapentin (NEURONTIN) 300 MG capsule [id number] Yes Take one tablet (300 MG) by mouth in the morning, then take one tablet (300 MG) by mouth at noon, and then take 600 MG (two tablets) by mouth before bedtime.  Patient taking differently: Take 1 capsule by mouth in the morning, 1 capsule at noon, and 2 capsules at bedtime as needed for neuropathy   Marjie Skiff, NP Taking Active Self, Pharmacy Records           Med Note Littie Deeds, CHERYL A   Wed Dec 17, 2022  8:38 AM) Taking as needed  glucose blood [facility] VERIO) test strip [id number]  Use to check blood sugar 3 times a day and document results, bring to appointments.  Goal is <130 fasting blood sugar and <180 two hours after meals.  Patient not taking: Reported on 02/17/2023   Marjie Skiff, NP  Active Self, Pharmacy Records  Lancets [facility] ULTRASOFT) lancets [id number] Yes Use to check blood sugar 3 times a day and document results, bring to appointments.  Goal is <130 fasting blood sugar and <180 two hours after meals. Cannady, Jolene  T, NP Taking Active Self, Pharmacy Records           Med Note (Darnita Woodrum L   Tue Feb 24, 2023  3:03 PM) Not currently checking BG   magnesium oxide (MAG-OX) 400 (240 Mg) MG tablet [id number] No Take 1 tablet (400 mg total) by mouth 2 (two) times daily.  Patient not taking: Reported on 02/17/2023   Aura Dials T, NP Not Taking Active Self,  Pharmacy Records  metFORMIN (GLUCOPHAGE-XR) 500 MG 24 hr tablet [id number] Yes Take 1 tablet (500 mg total) by mouth daily. Aura Dials T, NP Taking Active Self, Pharmacy Records  ondansetron (ZOFRAN) 4 MG tablet [id number] No Take 1 tablet (4 mg total) by mouth every 8 (eight) hours as needed.  Patient not taking: Reported on 02/17/2023   Janith Lima, MD Not Taking Active Self, Pharmacy Records           Med Note [facility], Athira Janowicz L   Tue Feb 24, 2023  3:04 PM) Denies N/V  pantoprazole (PROTONIX) 40 MG tablet [id number] Yes Take 1 tablet (40 mg total) by mouth daily. Marjie Skiff, NP Taking Active Self, Pharmacy Records  potassium chloride (KLOR-CON M) 10 MEQ tablet [id number]  Take 2 tablets (20 mEq total) by mouth daily for 5 days.  Patient not taking: Reported on 02/17/2023   Aura Dials T, NP  Expired 02/15/23 2359   propranolol ER (INDERAL LA) 60 MG 24 hr capsule [id number] Yes Take 1 capsule (60 mg total) by mouth daily. Marjie Skiff, NP Taking Active Self, Pharmacy Records  tiZANidine (ZANAFLEX) 2 MG tablet [id number] No TAKE 1 TABLET(2 MG) BY MOUTH EVERY 8 HOURS AS NEEDED FOR MUSCLE SPASMS  Patient not taking: No sig reported   Marjie Skiff, NP Not Taking Active Self, Pharmacy Records  triamcinolone cream (KENALOG) 0.1 % [id number] No Apply 1 application topically 2 (two) times daily.  Patient not taking: Reported on 02/17/2023   Gerre Scull, NP Not Taking Active Self, Pharmacy Records  VOLTAREN 1 % GEL [id number] No Apply 4 gram to affected area four times a day as needed for pain for knee pain. Do not exceed 16gm for LE joint per day  Patient not taking: Reported on 02/17/2023   [provider] Not Taking Active Self, Pharmacy Records          Medication reconciliation / review completed based on most recent discharge summary and EHR medication list.Patient is not taking  prescribed medications as instructed (any discrepancies are noted in review  section)   Patient is aware of medication regimen. Per discharge summary no changes made Patient denies questions at this time and reports no barriers to medication adherence.He acknowledge poor adherence and compliance    Home Care and Equipment/Supplies: Were Home Health Services Ordered?: No (Called PCP to request [facility] services) Any new equipment or medical supplies ordered?: No  Functional Questionnaire: Do you need assistance with bathing/showering or dressing?: No Do you need assistance with meal preparation?: No Do you need assistance with eating?: No Do you have difficulty maintaining continence: No Do you need assistance with getting out of bed/getting out of a chair/moving?: Yes (He feels overal, he is weak and not as " good" as he thought he was) Do you have difficulty managing or taking your medications?: No  Follow up appointments reviewed: PCP Follow-up appointment confirmed?: Yes Date of PCP follow-up appointment?: 01/31/23 Follow-up Provider: Marjie Skiff, NP arranged by Care Team [facility] Follow-up appointment  confirmed?: Yes Date of Specialist follow-up appointment?: 03/13/23 Follow-Up Specialty Provider:: GI 2/14, Oncology 3/14 Do you need transportation to your follow-up appointment?: No (He drives and can get transport) Do you understand care options if your condition(s) worsen?: Yes-patient verbalized understanding  SDOH Interventions Today    Flowsheet Row Most Recent Value  SDOH Interventions   Food Insecurity Interventions Intervention Not Indicated  Housing Interventions Intervention Not Indicated  Transportation Interventions Intervention Not Indicated, Patient Resources (Friends/Family)  Utilities Interventions Intervention Not Indicated  Social Connections Interventions Intervention Not Indicated     Patient is at high risk for readmission and/or has history of  high utilization  Discussed VBCI  TOC program and weekly calls to patient to  assess condition/status, medication management  and provide support/education as indicated . Patient/ Caregiver voiced understanding and is  agreeable to 30 day program     Routine follow-up and on-going assessment evaluation and education of disease processes, recommended interventions for both chronic and acute medical conditions , will occur during each weekly visit along with ongoing review of symptoms ,medication reviews and reconciliation. Any updates , inconsistencies, discrepancies or acute care concerns will be addressed and routed to the correct Practitioner if indicated   Based on current information and Insurance plan -Reviewed benefits available to patient, including details about eligibility options for care if any area of needs were identified.  Reviewed patients ability to access and / or navigating the benefits system..Amb Referral made if indicted , refer to orders section of note for details   Please refer to Care Plan for goals and interventions -Effectiveness of interventions, symptom management and outcomes will be evaluated  weekly during [facility] 30-day Program Outreach calls  . Any necessary  changes and updates to Care Plan will be completed episodically    Reviewed goals for care Patient verbalizes understanding of instructions and care plan provided. Patient was encouraged to make informed decisions about their care, actively participate in managing their health condition, and implement lifestyle changes as needed to promote independence and self-management of health care   The patient has been provided with contact information for the care management team and has been advised to call with any health-related questions or concerns. Follow up as indicated with Care Team , or sooner should any new problems arise.  Call placed to PCP requested Home Health referral for SN, PT and OT   Susa Loffler , BSN, RN [facility]   VBCI-Population Health RN Care Manager Direct Dial  [phone]  Fax: [phone] Website: [email or web address]

## 2023-02-24 NOTE — Patient Instructions (Signed)
Visit Information  Thank you for taking time to visit with me today. Please don't hesitate to contact me if I can be of assistance to you before our next scheduled telephone appointment.  Our next appointment is by telephone on 03/04/23 at 10:30am   Following is a copy of your care plan:   Goals Addressed             This Visit's Progress    TOC Care Plan       Current Barriers:  Medication management medication adherence  Provider appointments- scheduled follow-up  Home Health services -new request for service  RNCM Clinical Goal(s):  Patient will work with the Care Management team over the next 30 days to address Transition of Care Barriers: Medication Management Support at home Provider appointments Home Health services take all medications exactly as prescribed and will call provider for medication related questions as evidenced by no missed medication doses, check BG consistently  attend all scheduled medical appointments: PCP and GI , Neurology and Oncology as evidenced by n  missed follow-up visits  through collaboration with RN Care manager, provider, and care team.   Interventions: Evaluation of current treatment plan related to  self management and patient's adherence to plan as established by provider  Transitions of Care:  New goal. Doctor Visits  - discussed the importance of doctor visits Contacted provider for patient needs Home Health Arranged PCP follow-up within 12-14 days (Care Guide Scheduled) Post discharge activity limitations prescribed by provider reviewed Reviewed Signs and symptoms of infection  Diabetes Interventions:  (Status:  New goal.) Short Term Goal Assessed patient's understanding of A1c goal: <8% Provided education to patient about basic DM disease process Reviewed medications with patient and discussed importance of medication adherence Reviewed scheduled/upcoming provider appointments including: need for Home Health SN, OT/OT Review of  patient status, including review of consultants reports, relevant laboratory and other test results, and medications completed Screening for signs and symptoms of depression related to chronic disease state  Assessed social determinant of health barriers Lab Results  Component Value Date   HGBA1C 5.4 02/21/2023    Patient Goals/Self-Care Activities: Participate in Transition of Care Program/Attend [facility] scheduled calls Take all medications as prescribed Attend all scheduled provider appointments Call pharmacy for medication refills 3-7 days in advance of running out of medications Perform all self care activities independently  Perform IADL's (shopping, preparing meals, housekeeping, managing finances) independently Call provider office for new concerns or questions   Follow Up Plan:  Telephone follow up appointment with care management team member scheduled for:  03/04/23 @ 10:30am  The patient has been provided with contact information for the care management team and has been advised to call with any health related questions or concerns.          Medication review  Reviewed current home medications -- provided education as needed. Patient is aware of potential side effects and was encouraged to notify PCP for any adverse side effects or unwanted symptoms not relieved with interventions Patient will call 911 for Medical Emergencies or Life -Threatening Symptoms.  Reviewed goals for care Patient/ Caregiver verbalizes understanding of instructions with the plan of care . The  Patient / Caregiver was encouraged to make informed decisions about care, actively participate in managing health conditions, and implement lifestyle changes as needed to promote independence and self-management of healthcare. SDOH screenings have been completed and addressed if indicted There are no reported barriers to care.    Follow-up Plan VBCI  Case Management Nurse will provide follow-up and on-going  assessment ,evaluation and education of disease processes, recommended interventions for both chronic and acute medical conditions ,  along with ongoing review of symptoms ,medication reviews / reconciliation during each weekly call . Any updates , inconsistencies, discrepancies or acute care concerns will be addressed and routed to the correct Practitioner if indicated   [facility]  Please call the care guide team at [phone]  if you need to cancel or reschedule your appointment . For scheduled calls -Three attempts will be made to reach you -if the scheduled call is missed or  we are unable to reach the you after 3 attempts no additional outreach attempts will be made and the TOC follow-up will be closed .   If you need to speak to a Nurse you may  call me directly at the number below or if I am unavailable,and  your need is urgent  please call the main VBCI number at [phone] and ask to speak with one of the [facility] ( Transition of Care )  Nurses  .  Patient was encouraged to Contact PCP with any questions or concerns regarding ongoing medical care, any difficulty obtaining or picking up prescriptions, any changes or worsening in condition including signs / symptoms not relieved  with interventions                                                                               Additionally, If you experience worsening of your symptoms, develop shortness of breath, If you are experiencing a medical emergency,  develop suicidal or homicidal thoughts you must seek medical attention immediately by calling 911 or report to your local emergency department or urgent care.   If you have a non-emergency medical problem during routine business hours, please contact your provider's office and ask to speak with a nurse.       Please take the time to read instructions/literature along with the possible adverse reactions/side effects for all the Medicines that have been prescribed to you. Only  take newly prescribed  Medications after you have completely understood and accept all the possible adverse reactions/side effects.   Do not take more than prescribed Medications for  Pain, Sleep and Anxiety. Do not drive when taking Pain medications or sleep aid/ insomnia  medications It is not advisable to combine anxiety, sleep and pain medications without talking with your primary care practitioner    If you are experiencing a Mental Health or Behavioral Health Crisis or need someone to talk to Please call the Suicide and Crisis Lifeline: 988 You may also call the [place] National Suicide Prevention Lifeline: (430)738-1942 or TTY: 579-723-6508 TTY 207-577-5115) to talk to a trained counselor.  You may call the Behavioral Health Crisis Line at 548-843-7040, at any time, 24 hours a day, 7 days a week- however If you are in danger or need immediate medical attention, call 911.   If you would like help to quit smoking, call 1-800-QUIT-NOW ( 716-729-5144) OR Espaol: 1-855-Djelo-Ya (7-564-332-9518) o para ms informacin haga clic aqu or Text READY to 841-660 to register via text.   Susa Loffler , BSN, RN [employer]  VBCI-Population Health RN Care Manager Direct Dial [phone]  Website: [email or web address]

## 2023-02-25 ENCOUNTER — Telehealth: Payer: Self-pay | Admitting: Nurse Practitioner

## 2023-02-25 NOTE — Telephone Encounter (Signed)
Called and notified patient of George Gomez's message.

## 2023-02-25 NOTE — Telephone Encounter (Signed)
Can we order this now or does it need to wait until next week at scheduled appointment?

## 2023-02-25 NOTE — Telephone Encounter (Signed)
Copied from CRM [id number]. Topic: Referral - Request for Referral >> Feb 24, 2023  2:59 PM Everette C wrote: Did the patient discuss referral with their provider in the last year? Yes (If No - schedule appointment) (If Yes - send message)  Appointment offered? No  Type of order/referral and detailed reason for visit: Home Health / pt and ot   Preference of office, provider, location: patent has no preference   If referral order, have you been seen by this specialty before? Yes (If Yes, this issue or another issue? When? Where?  Can we respond through MyChart? No

## 2023-03-01 NOTE — Patient Instructions (Incomplete)

## 2023-03-03 ENCOUNTER — Inpatient Hospital Stay: Payer: 59 | Admitting: Nurse Practitioner

## 2023-03-03 DIAGNOSIS — F1093 Unspecified convulsions: Secondary | ICD-10-CM

## 2023-03-03 DIAGNOSIS — R2990 Unspecified symptoms and signs involving the nervous system: Secondary | ICD-10-CM

## 2023-03-03 DIAGNOSIS — E1122 Type 2 diabetes mellitus with diabetic chronic kidney disease: Secondary | ICD-10-CM

## 2023-03-03 DIAGNOSIS — I152 Hypertension secondary to endocrine disorders: Secondary | ICD-10-CM

## 2023-03-03 DIAGNOSIS — E1129 Type 2 diabetes mellitus with other diabetic kidney complication: Secondary | ICD-10-CM

## 2023-03-04 ENCOUNTER — Other Ambulatory Visit: Payer: Self-pay

## 2023-03-04 DIAGNOSIS — R2990 Unspecified symptoms and signs involving the nervous system: Secondary | ICD-10-CM

## 2023-03-04 DIAGNOSIS — F1010 Alcohol abuse, uncomplicated: Secondary | ICD-10-CM

## 2023-03-04 DIAGNOSIS — E1140 Type 2 diabetes mellitus with diabetic neuropathy, unspecified: Secondary | ICD-10-CM

## 2023-03-04 DIAGNOSIS — I152 Hypertension secondary to endocrine disorders: Secondary | ICD-10-CM

## 2023-03-04 DIAGNOSIS — R569 Unspecified convulsions: Secondary | ICD-10-CM

## 2023-03-04 DIAGNOSIS — E1122 Type 2 diabetes mellitus with diabetic chronic kidney disease: Secondary | ICD-10-CM

## 2023-03-04 NOTE — Patient Instructions (Signed)
 Visit Information  Thank you for taking time to visit with me today. Please don't hesitate to contact me if I can be of assistance to you before our next scheduled telephone appointment.    Following is a copy of your care plan:   Goals Addressed             This Visit's Progress    TOC Care Plan       Current Barriers:  Medication management medication adherence  Provider appointments- scheduled follow-up  Home Health services -new request for service  RNCM Clinical Goal(s):  Patient will work with the Care Management team over the next 30 days to address Transition of Care Barriers: Medication Management Support at home Provider appointments Home Health services take all medications exactly as prescribed and will call provider for medication related questions as evidenced by no missed medication doses, check BG consistently  attend all scheduled medical appointments: PCP and GI , Neurology and Oncology as evidenced by n  missed follow-up visits  through collaboration with RN Care manager, provider, and care team.   Interventions: Evaluation of current treatment plan related to  self management and patient's adherence to plan as established by provider  Transitions of Care:  Goal on track:  NO. Doctor Visits  - discussed the importance of doctor visits Contacted provider for patient needs Home Health Arranged PCP follow-up within 12-14 days (Care Guide Scheduled) Post discharge activity limitations prescribed by provider reviewed Reviewed Signs and symptoms of infection  Diabetes Interventions:  (Status:  Goal on track:  NO.) Short Term Goal Assessed patient's understanding of A1c goal: <8% Provided education to patient about basic DM disease process Reviewed medications with patient and discussed importance of medication adherence Reviewed scheduled/upcoming provider appointments including: need for Home Health SN, OT/OT Review of patient status, including review of  consultants reports, relevant laboratory and other test results, and medications completed Screening for signs and symptoms of depression related to chronic disease state  Assessed social determinant of health barriers Lab Results  Component Value Date   HGBA1C 5.4 02/21/2023    Patient Goals/Self-Care Activities: Participate in Transition of Care Program/Attend TOC scheduled calls Take all medications as prescribed Attend all scheduled provider appointments Call pharmacy for medication refills 3-7 days in advance of running out of medications Perform all self care activities independently  Perform IADL's (shopping, preparing meals, housekeeping, managing finances) independently Call provider office for new concerns or questions   Follow Up Plan:  Telephone follow up appointment with care management team member scheduled for:  attempted to call patient back x 2 .no answer  The patient has been provided with contact information for the care management team and has been advised to call with any health related questions or concerns.           Medication review  Reviewed current home medications -- provided education as needed. Patient is aware of potential side effects and was encouraged to notify PCP for any adverse side effects or unwanted symptoms not relieved with interventions  Patient will call 911 for Medical Emergencies or Life -Threatening Symptoms.  Reviewed goals for care Patient/ Caregiver verbalizes understanding of instructions with the plan of care . The  Patient / Caregiver was encouraged to make informed decisions about care, actively participate in managing health conditions, and implement lifestyle changes as needed to promote independence and self-management of healthcare. SDOH screenings have been completed and addressed if indicted.  There are no reported barriers to care.  Follow-up Plan VBCI Case Management Nurse will provide follow-up and on-going assessment  ,evaluation and education of disease processes, recommended interventions for both chronic and acute medical conditions ,  along with ongoing review of symptoms ,medication reviews / reconciliation during each weekly call . Any updates , inconsistencies, discrepancies or acute care concerns will be addressed and routed to the correct Practitioner if indicated   [facility]  Please call the care guide team at [phone]  if you need to cancel or reschedule your appointment . For scheduled calls -Three attempts will be made to reach you -if the scheduled call is missed or  we are unable to reach the you after 3 attempts no additional outreach attempts will be made and the TOC follow-up will be closed .   If you need to speak to a Nurse you may  call me directly at the number below or if I am unavailable,and  your need is urgent  please call the main [facility]I number at [phone] and ask to speak with one of the [facility] ( Transition of Care )  Nurses  .  Patient was encouraged to Contact PCP with any changes in baseline or  medication regimen,  changes in health status  /  well-being, safety concerns, including falls any questions or concerns regarding ongoing medical care, any difficulty obtaining or picking up prescriptions, any changes or worsening in condition- including  symptoms not relieved  with interventions                                                                            Additionally, If you experience worsening of your symptoms, develop shortness of breath, If you are experiencing a medical emergency,  develop suicidal or homicidal thoughts you must seek medical attention immediately by calling 911 or report to your local emergency department or urgent care.   If you have a non-emergency medical problem during routine business hours, please contact your provider's office and ask to speak with a nurse.       Please take the time to read instructions/literature along with the  possible adverse reactions/side effects for all the Medicines that have been prescribed to you. Only take newly prescribed  Medications after you have completely understood and accept all the possible adverse reactions/side effects.   Do not take more than prescribed Medications for  Pain, Sleep and Anxiety. Do not drive when taking Pain medications or sleep aid/ insomnia  medications It is not advisable to combine anxiety, sleep and pain medications without talking with your primary care practitioner    If you are experiencing a Mental Health or Behavioral Health Crisis or need someone to talk to Please call the Suicide and Crisis Lifeline: 23 You may also call the [place]  National Suicide Prevention Lifeline: 873-568-2160 or TTY: 587 489 4742 TTY 905 755 2754) to talk to a trained counselor.  You may call the Behavioral Health Crisis Line at 252-443-2380, at any time, 24 hours a day, 7 days a week- however If you are in danger or need immediate medical attention, call 911.   If you would like help to quit smoking, call 1-800-QUIT-NOW ( 954 546 6312) OR Espaol: 1-855-Djelo-Ya (8-144-664-6430) o para ms informacin haga clic aqu  or Text READY to 200-400 to register via text.   Bari Mayans , BSN, RN [place]   VBCI-Population Health RN Care Manager Direct Dial [phone]  Website: [email or web address]

## 2023-03-04 NOTE — Patient Outreach (Signed)
 Care Management  Transitions of Care Program Transitions of Care Post-discharge week 2   03/04/2023 Name: George Gomez MRN: [id number] DOB: 10-Jan-1961  Subjective: George Gomez is a 63 y.o. year old male who is a primary care patient of Cannady, Jolene T, NP. The Care Management team Engaged with patient Engaged with patient by telephone to assess and address transitions of care needs.   Consent to Services:  Patient was given information about care management services, agreed to services, and gave verbal consent to participate.   Assessment:   Patient/ Caregiver  voices no new complaints or concerns  and has not developed/ reported any new medical issues / Dx or acute changes. - since last follow-up call for most recent Hospital stay   1/12-1/14 / 2025 He is not doing well at home He reports he stopped taking ALL his medication. He is not checking his BG. He missed his PCP follow-up stating He  just couldn't do it Tus was rescheduled with Care Guides for 03/17/23 Reiterated the importance of Follow-up visits. Discussed medicatin adherence Will submit a Amb referral for Pharmacy he feels the medications are to cumbersome and he would be oen to blister pack or Unt dose packs he reports his sister  messed up his medications so now he's not taking any of them . He states he needs additional assistance at home reviewed the need for a PCP F2F visit to make referral for Home Health SN, OT and OT. He reports he drives and can make it to an appointment.  Medication reconciliation/ review completed based on most recent medication list in EHR; and in review of recent Provider follow-up appointments- He is NOT taking prescribed medications as instructed and is making this conscious choice  Patient self-manages medications;   Patient / Caregiver educated on red flag s/s to watch for based on current discharge diagnosis and was encouraged to report, any changes in baseline or  medication regimen,   or any  new unmanaged side effects or symptoms not relieved with interventions  to PCP and / or the  VBCI Case Management team .  Call was disconnected Attempted x 2 to call,back with no answer. Will reattempt to reach patient at later time         SDOH Interventions    Flowsheet Row Telephone from 02/24/2023 in [facility] Clinical Support from 02/17/2023 in [facility] Office Visit from 07/24/2022 in [facility] Office Visit from 01/01/2022 in [facility] Clinical Support from 09/24/2021 in [facility] Office Visit from 01/14/2021 in [facility]  SDOH Interventions        Food Insecurity Interventions Intervention Not Indicated Intervention Not Indicated -- -- Intervention Not Indicated --  Housing Interventions Intervention Not Indicated Intervention Not Indicated -- -- Intervention Not Indicated --  Transportation Interventions Intervention Not Indicated, Patient Resources (Friends/Family) Intervention Not Indicated -- -- Intervention Not Indicated --  Utilities Interventions Intervention Not Indicated Intervention Not Indicated -- -- -- --  Alcohol  Usage Interventions -- Patient Declined -- -- -- --  Depression Interventions/Treatment  -- -- Patient refuses Treatment PHQ2-9 Score <4 Follow-up Not Indicated -- Patient refuses Treatment  Financial Strain Interventions -- Intervention Not Indicated -- -- Intervention Not Indicated --  Physical Activity Interventions -- Patient Declined -- -- Intervention Not Indicated --  Stress Interventions -- Intervention Not Indicated -- -- Intervention Not Indicated --  Social Connections Interventions  Intervention Not Indicated Intervention Not Indicated -- -- Intervention Not Indicated --  Health Literacy Interventions -- Intervention Not Indicated -- -- -- --        Goals Addressed             This  Visit's Progress    TOC Care Plan       Current Barriers:  Medication management medication adherence  Provider appointments- scheduled follow-up  Home Health services -new request for service  RNCM Clinical Goal(s):  Patient will work with the Care Management team over the next 30 days to address Transition of Care Barriers: Medication Management Support at home Provider appointments Home Health services take all medications exactly as prescribed and will call provider for medication related questions as evidenced by no missed medication doses, check BG consistently  attend all scheduled medical appointments: PCP and GI , Neurology and Oncology as evidenced by n  missed follow-up visits  through collaboration with RN Care manager, provider, and care team.   Interventions: Evaluation of current treatment plan related to  self management and patient's adherence to plan as established by provider  Transitions of Care:  Goal on track:  NO. Doctor Visits  - discussed the importance of doctor visits Contacted provider for patient needs Home Health Arranged PCP follow-up within 12-14 days (Care Guide Scheduled) Post discharge activity limitations prescribed by provider reviewed Reviewed Signs and symptoms of infection  Diabetes Interventions:  (Status:  Goal on track:  NO.) Short Term Goal Assessed patient's understanding of A1c goal: <8% Provided education to patient about basic DM disease process Reviewed medications with patient and discussed importance of medication adherence Reviewed scheduled/upcoming provider appointments including: need for Home Health SN, OT/OT Review of patient status, including review of consultants reports, relevant laboratory and other test results, and medications completed Screening for signs and symptoms of depression related to chronic disease state  Assessed social determinant of health barriers Lab Results  Component Value Date   HGBA1C 5.4 02/21/2023     Patient Goals/Self-Care Activities: Participate in Transition of Care Program/Attend TOC scheduled calls Take all medications as prescribed Attend all scheduled provider appointments Call pharmacy for medication refills 3-7 days in advance of running out of medications Perform all self care activities independently  Perform IADL's (shopping, preparing meals, housekeeping, managing finances) independently Call provider office for new concerns or questions   Follow Up Plan:  Telephone follow up appointment with care management team member scheduled for:  attempted to call patient back x 2 .no answer  The patient has been provided with contact information for the care management team and has been advised to call with any health related questions or concerns.          Plan:  Care Guide Rescheduled PCP appt for 2/18 @ 3:40pm Attempted to [place]ke follow-up call after call dropped No answer x 2  Still needs HH referral ( SN/PT/OT) Amb referral for Pharmacy Medication review, compliance and adherence assistance  Routine follow-up and on-going assessment evaluation and education of disease processes, recommended interventions for both chronic and acute medical conditions , will occur during each weekly visit along with ongoing review of symptoms ,medication reviews and reconciliation. Any updates , inconsistencies, discrepancies or acute care concerns will be addressed and routed to the correct Practitioner if indicated   Based on current information and Insurance plan -Reviewed benefits available to patient, including details about eligibility options for care if any area of needs were identified.  Reviewed patients ability to  access and / or navigating the benefits system..Amb Referral made if indicted , refer to orders section of note for details   Please refer to Care Plan for goals and interventions -Effectiveness of interventions, symptom management and outcomes will be evaluated  weekly  during [facility] 30-day Program Outreach calls  . Any necessary  changes and updates to Care Plan will be completed episodically    Reviewed goals for care Patient verbalizes understanding of instructions and care plan provided. Patient was encouraged to make informed decisions about their care, actively participate in managing their health condition, and implement lifestyle changes as needed to promote independence and self-management of health care   Patient was encouraged to Contact PCP with any changes in baseline or  medication regimen,  changes in health status  /  well-being, safety concerns, including falls any questions or concerns regarding ongoing medical care, any difficulty obtaining or picking up prescriptions, any changes or worsening in condition- including  symptoms not relieved  with interventions    The patient has been provided with contact information for the care management team and has been advised to call with any health-related questions or concerns. Follow up as indicated with Care Team , or sooner should any new problems arise.   Telephone follow up appointment with care management team member scheduled for: pending  The patient has been provided with contact information for the care management team and has been advised to call with any health related questions or concerns.   Bari Mayans , BSN, RN [facility]   VBCI-Population Health RN Care Manager Direct Dial [phone]  Fax: [phone] Website: [email or web address]

## 2023-03-05 ENCOUNTER — Telehealth: Payer: Self-pay | Admitting: *Deleted

## 2023-03-05 NOTE — Progress Notes (Signed)
 Complex Care Management Note Care Guide Note  03/05/2023 Name: Indio Santilli MRN: [id number] DOB: December 17, 1960   Complex Care Management Outreach Attempts: An unsuccessful telephone outreach was attempted today to offer the patient information about available complex care management services.  Follow Up Plan:  Additional outreach attempts will be made to offer the patient complex care management information and services.   Encounter Outcome:  No Answer  Thedford Franks, CMA, Care Guide [facility]  [facility], [facility] Guide Direct Dial: [phone]  Fax: [phone] Website: [email or web address]

## 2023-03-06 NOTE — Progress Notes (Signed)
 Complex Care Management Note  Care Guide Note 03/06/2023 Name: Jedidiah Demartini MRN: [id number] DOB: 28-Apr-1960  Zian Delair is a 63 y.o. year old male who sees Valerio Moris T, NP for primary care. I reached out to Ozell Riding by phone today to offer complex care management services.  Mr. Palka was given information about Complex Care Management services today including:   The Complex Care Management services include support from the care team which includes your Nurse Care Manager, Clinical Social Worker, or Pharmacist.  The Complex Care Management team is here to help remove barriers to the health concerns and goals most important to you. Complex Care Management services are voluntary, and the patient may decline or stop services at any time by request to their care team member.   Complex Care Management Consent Status: Patient agreed to services and verbal consent obtained.   Follow up plan:  Telephone appointment with complex care management team member scheduled for:  03/10/2023  Encounter Outcome:  Patient Scheduled  Thedford Franks, CMA, Care Guide [facility]  [facility], [facility] Guide Direct Dial: [phone]  Fax: [phone] Website: [email or web address]

## 2023-03-10 ENCOUNTER — Ambulatory Visit: Payer: Self-pay | Admitting: *Deleted

## 2023-03-10 NOTE — Patient Outreach (Addendum)
  Care Coordination   Initial Visit Note   03/10/2023 Name: George Gomez MRN: [id number] DOB: [birth year]  George Gomez is a 63 y.o. year old male who sees Aura Dials T, NP for primary care. I spoke with  George Gomez by phone today.  What matters to the patients health and wellness today?  Assistance with medication. Per patient, his medications are disorganized. He is not sure what is safe to take and what is not. Patient states that he is not taking any medication at this time due to feeling confused regarding medication regimen    Goals Addressed             This Visit's Progress    Assistance with medication organization       Activities and task to complete in order to accomplish goals.   REFERRAL PLACED Referral placed a referral with the pharmacist to assist with managing your medications. They will contact you to review your medications and options.         SDOH assessments and interventions completed:  Yes  SDOH Interventions Today    Flowsheet Row Most Recent Value  SDOH Interventions   Food Insecurity Interventions Intervention Not Indicated  Housing Interventions Intervention Not Indicated  Transportation Interventions Intervention Not Indicated, Patient Resources (Friends/Family)  Utilities Interventions Intervention Not Indicated  Alcohol Usage Interventions Alcohol Education/Brief Advice, Patient Declined, Other (Comment)        Care Coordination Interventions:  Yes, provided  Interventions Today    Flowsheet Row Most Recent Value  Chronic Disease   Chronic disease during today's visit Diabetes, Hypertension (HTN)  General Interventions   General Interventions Discussed/Reviewed General Interventions Discussed, [employer], Doctor Visits  [patient assessed for [employer] needs related to medication adherance and in home care support Pt lives alone, takes care of his own ADL's, family members call regularly to "check in" history  of cocaine use, current alcohol use confirmed]  Doctor Visits Discussed/Reviewed PCP  [facility] 2/19]  PCP/Specialist Visits Compliance with follow-up visit  Education Interventions   Education Provided Provided Education  Provided Verbal Education On Mental Health/Coping with Illness  [Substance Abuse treament resources reviewed-patient has declined]  Mental Health Interventions   Mental Health Discussed/Reviewed Substance Abuse  [patient confirms daily alcohol use, however declines referral for treatment-declines AA and outpatient follow up]  Nutrition Interventions   Nutrition Discussed/Reviewed Nutrition Discussed  [per patient, he prepares his own meals]  Pharmacy Interventions   Pharmacy Dicussed/Reviewed Pharmacy Topics Discussed, Referral to Pharmacist  [Pharmacist organized medication for pt in Dec.-sister came by  "last month" took all meds and mixed it in with meds that pharmacist organized-now meds disorganized-not sure what to take, not taking anything now-agreeable to blister packs-]  Referral to Pharmacist --  [Patient not taking medications-stating they were organized at one point, however now are]  Safety Interventions   Safety Discussed/Reviewed Safety Discussed  [encouraged use of assistive devices(cain)  to avoid falls]  Advanced Directive Interventions   Advanced Directives Discussed/Reviewed Advanced Directives Discussed       Follow up plan: Follow up call scheduled for 03/18/23    Encounter Outcome:  Patient Visit Completed

## 2023-03-10 NOTE — Patient Instructions (Signed)
Visit Information  Thank you for taking time to visit with me today. Please don't hesitate to contact me if I can be of assistance to you.   Following are the goals we discussed today:   Goals Addressed             This Visit's Progress    Assistance with medication organization       Activities and task to complete in order to accomplish goals.   REFERRAL PLACED Referral placed a referral with the pharmacist to assist with managing your medications. They will contact you to review your medication regimen and options.         Our next appointment is by telephone on 03/18/23 at 10am  Please call the care guide team at [phone] if you need to cancel or reschedule your appointment.   If you are experiencing a Mental Health or Behavioral Health Crisis or need someone to talk to, please call 911   Patient verbalizes understanding of instructions and care plan provided today and agrees to view in MyChart. Active MyChart status and patient understanding of how to access instructions and care plan via MyChart confirmed with patient.     Telephone follow up appointment with care management team member scheduled for: 03/18/23  Verna Czech, LCSW [place]  [facility], [facility] Licensed Clinical Social Worker Care Coordinator  Direct Dial: [phone]

## 2023-03-11 ENCOUNTER — Telehealth: Payer: Self-pay | Admitting: Nurse Practitioner

## 2023-03-11 ENCOUNTER — Other Ambulatory Visit: Payer: Self-pay | Admitting: Nurse Practitioner

## 2023-03-11 ENCOUNTER — Other Ambulatory Visit: Payer: Self-pay

## 2023-03-11 ENCOUNTER — Telehealth: Payer: Self-pay

## 2023-03-11 DIAGNOSIS — E1129 Type 2 diabetes mellitus with other diabetic kidney complication: Secondary | ICD-10-CM

## 2023-03-11 DIAGNOSIS — R2990 Unspecified symptoms and signs involving the nervous system: Secondary | ICD-10-CM

## 2023-03-11 DIAGNOSIS — F10930 Alcohol use, unspecified with withdrawal, uncomplicated: Secondary | ICD-10-CM

## 2023-03-11 NOTE — Telephone Encounter (Signed)
Chi pharmacists with [facility] states that she just spoke with the pt and was told that his medication: metFORMIN (GLUCOPHAGE-XR) 500 MG 24 hr tablet  has been discontinued. Chi is wanting to confirm if the medication has been discontinued or not?

## 2023-03-11 NOTE — Patient Outreach (Signed)
Care Management  Transitions of Care Program Transitions of Care Post-discharge week 3   03/11/2023 Name: George Gomez MRN: [id number] DOB: [birth year]  Subjective: George Gomez is a 63 y.o. year old male who is a primary care patient of Cannady, Dorie Rank, NP. The Care Management team Engaged with patient Engaged with patient by telephone to assess and address transitions of care needs.   Consent to Services:  Patient was given information about care management services, agreed to services, and gave verbal consent to participate.   Assessment:   Patient/ Caregiver  voices no new complaints or concerns  and has not developed/ reported any new medical issues / Dx or acute changes. - since last follow-up call for most recent Hospital stay  1/24-1/26 / 2025 He is still having difficulty at home. He is nit taking his medications. He has several appointments upcoming and presents as aloof to these He was reminded of the  importance of seeing his PCP next week for a F2F visit to be able to initiate Home Health services.  (SN/PT/OT)  Medication reconciliation/ review completed based on most recent medication list in EHR; - confirmed patient  is not  taking prescribed medications as instructed . Patient has difficulty managing his medications and at this point is not taking any medications.  He feels he is unsure of what is what after his sister arranged them for him in pill boxes  He has a call scheduled with [facility] 2/20  to review and reconcile medications to see if these can be simplified He does agree to blister packs once his medications can be reduced  Patient / Caregiver educated on red flag s/s to watch for based on current discharge diagnosis and was encouraged to report, any changes in baseline or  medication regimen,   or any new unmanaged side effects or symptoms not relieved with interventions  to PCP and / or the  [facility] Case Management team .          SDOH Interventions     Flowsheet Row Care Coordination from 03/10/2023 in [facility] Care Coordination Telephone from 02/24/2023 in [facility] Clinical Support from 02/17/2023 in [facility] Office Visit from 07/24/2022 in [facility] Office Visit from 01/01/2022 in [facility] Clinical Support from 09/24/2021 in [facility]  SDOH Interventions        Food Insecurity Interventions Intervention Not Indicated Intervention Not Indicated Intervention Not Indicated -- -- Intervention Not Indicated  Housing Interventions Intervention Not Indicated Intervention Not Indicated Intervention Not Indicated -- -- Intervention Not Indicated  Transportation Interventions Intervention Not Indicated, Patient Resources (Friends/Family) Intervention Not Indicated, Patient Resources (Friends/Family) Intervention Not Indicated -- -- Intervention Not Indicated  Utilities Interventions Intervention Not Indicated Intervention Not Indicated Intervention Not Indicated -- -- --  Alcohol Usage Interventions Alcohol Education/Brief Advice, Patient Declined, Other (Comment) -- Patient Declined -- -- --  Depression Interventions/Treatment  -- -- -- Patient refuses Treatment PHQ2-9 Score <4 Follow-up Not Indicated --  Financial Strain Interventions -- -- Intervention Not Indicated -- -- Intervention Not Indicated  Physical Activity Interventions -- -- Patient Declined -- -- Intervention Not Indicated  Stress Interventions -- -- Intervention Not Indicated -- -- Intervention Not Indicated  Social Connections Interventions -- Intervention Not Indicated Intervention Not Indicated -- -- Intervention Not Indicated  Health Literacy Interventions -- -- Intervention Not Indicated -- -- --  Goals Addressed             This Visit's Progress    TOC Care Plan       Current Barriers:  Medication  management medication adherence  Provider appointments- scheduled follow-up  Home Health services -new request for service- he missed previous PCP F2F visit rescheduled for 2/18   RNCM Clinical Goal(s):  Patient will work with the Care Management team over the next 30 days to address Transition of Care Barriers: Medication Management Support at home Provider appointments-reiterated the need to attend scheduled visits  Home Health services-requested SN PT OT needs F2F visit  take all medications exactly as prescribed and will call provider for medication related questions as evidenced by no missed medication doses, check BG consistently- he is refusing all meds   attend all scheduled medical appointments: PCP and GI , Neurology and Oncology as evidenced by n  missed follow-up visits through collaboration with RN Care manager, provider, and care team.   Interventions: Evaluation of current treatment plan related to  self management and patient's adherence to plan as established by provider  Transitions of Care:  Goal on track:  NO. Doctor Visits  - discussed the importance of doctor visits Contacted provider for patient needs Home Health Arranged PCP follow-up within 12-14 days (Care Guide Scheduled) Post discharge activity limitations prescribed by provider reviewed Reviewed Signs and symptoms of infection  Diabetes Interventions:  (Status:  Goal on track:  NO.) Short Term Goal Assessed patient's understanding of A1c goal: <8% Provided education to patient about basic DM disease process Reviewed medications with patient and discussed importance of medication adherence Reviewed scheduled/upcoming provider appointments including: need for Home Health SN, OT/OT Review of patient status, including review of consultants reports, relevant laboratory and other test results, and medications completed Screening for signs and symptoms of depression related to chronic disease state  Assessed social  determinant of health barriers Lab Results  Component Value Date   HGBA1C 5.4 02/21/2023    Patient Goals/Self-Care Activities: Participate in Transition of Care Program/Attend TOC scheduled calls Take all medications as prescribed Attend all scheduled provider appointments Call pharmacy for medication refills 3-7 days in advance of running out of medications Perform all self care activities independently  Perform IADL's (shopping, preparing meals, housekeeping, managing finances) independently Call provider office for new concerns or questions   Follow Up Plan:  Telephone follow up appointment with care management team member scheduled for:  03/20/23 @11 :00am Call w/ SW 03/18/23 Call with Pharmacist 03/19/23   The patient has been provided with contact information for the care management team and has been advised to call with any health related questions or concerns.          Plan:  PCP office notified of medication refusals and the need for Home health referral with next weeks visit ( spoke to [facility]) [facility] call scheduled 2/20  Routine follow-up and on-going assessment evaluation and education of disease processes, recommended interventions for both chronic and acute medical conditions , will occur during each weekly visit along with ongoing review of symptoms ,medication reviews and reconciliation. Any updates , inconsistencies, discrepancies or acute care concerns will be addressed and routed to the correct Practitioner if indicated   Based on current information and Insurance plan -Reviewed benefits available to patient, including details about eligibility options for care if any area of needs were identified.  Reviewed patients ability to access and / or navigating the benefits system..Amb Referral made if indicted ,  refer to orders section of note for details   Please refer to Care Plan for goals and interventions -Effectiveness of interventions, symptom management and  outcomes will be evaluated  weekly during [facility] 30-day Program Outreach calls  . Any necessary  changes and updates to Care Plan will be completed episodically    Reviewed goals for care Patient verbalizes understanding of instructions and care plan provided. Patient was encouraged to make informed decisions about their care, actively participate in managing their health condition, and implement lifestyle changes as needed to promote independence and self-management of health care   Patient was encouraged to Contact PCP with any changes in baseline or  medication regimen,  changes in health status  /  well-being, safety concerns, including falls any questions or concerns regarding ongoing medical care, any difficulty obtaining or picking up prescriptions, any changes or worsening in condition- including  symptoms not relieved  with interventions    The patient has been provided with contact information for the care management team and has been advised to call with any health-related questions or concerns. Follow up as indicated with Care Team , or sooner should any new problems arise.   The patient has been provided with contact information for the care management team and has been advised to call with any health related questions or concerns.  Susa Loffler , BSN, RN [facility]   [facility] RN Care Manager Direct Dial [phone]  Fax: [phone] Website: [email or web address]

## 2023-03-11 NOTE — Telephone Encounter (Signed)
Copied from CRM [id number]. Topic: General - Other >> Mar 11, 2023 10:24 AM Macon Large wrote: Reason for CRM: Christy with [facility] reports that the patient is not taking any of his medications. Neysa Bonito stated that the patient told her that his sister prepared his medications but he does not trust it because he feels that she messed up so he is not taking any medication. Patient has an upcoming appt so Neysa Bonito wanted to make Jolene aware. Christy requests referral for skilled nursing, PT, and OT. Cb# [phone]

## 2023-03-11 NOTE — Telephone Encounter (Signed)
Chi pharmacists with [facility] states that she just spoke with the pt and was told that his medication: metFORMIN (GLUCOPHAGE-XR) 500 MG 24 hr tablet  has been discontinued. Chi is wanting to confirm if the medication has been discontinued  Please Advise

## 2023-03-11 NOTE — Telephone Encounter (Signed)
Medication is still listed on current med list. Patient is still to be taking this medication correct?

## 2023-03-11 NOTE — Progress Notes (Signed)
Care Guide Pharmacy Note  03/11/2023 Name: George Gomez MRN: [id number] DOB: 1960/07/28  Referred By: Marjie Skiff, NP Reason for referral: Care Coordination (Outreach to schedule with pharm d )   George Gomez is a 63 y.o. year old male who is a primary care patient of Cannady, Dorie Rank, NP.  George Gomez was referred to the pharmacist for assistance related to: HTN and DMII  Successful contact was made with the patient to discuss pharmacy services including being ready for the pharmacist to call at least 5 minutes before the scheduled appointment time and to have medication bottles and any blood pressure readings ready for review. The patient agreed to meet with the pharmacist via telephone visit on (date/time).03/19/2023  Penne Lash , RMA       [facility], [facility] Guide  Direct Dial: [phone]  Website: [email or web address]

## 2023-03-12 NOTE — Telephone Encounter (Signed)
Called [employer] back and notified them of George Gomez's message.

## 2023-03-13 ENCOUNTER — Ambulatory Visit: Admission: RE | Admit: 2023-03-13 | Payer: 59 | Source: Ambulatory Visit | Admitting: Gastroenterology

## 2023-03-13 SURGERY — COLONOSCOPY
Anesthesia: General

## 2023-03-15 NOTE — Patient Instructions (Incomplete)
 IMAGING LOCATION: [address] Leonard Schwartz [place], [place] [address] [phone]   Heart-Healthy Eating Plan Many factors influence your heart health, including eating and exercise habits. Heart health is also called coronary health. Coronary risk increases with abnormal blood fat (lipid) levels. A heart-healthy eating plan includes limiting unhealthy fats, increasing healthy fats, limiting salt (sodium) intake, and making other diet and lifestyle changes. What is my plan? Your health care provider may recommend that: You limit your fat intake to _________% or less of your total calories each day. You limit your saturated fat intake to _________% or less of your total calories each day. You limit the amount of cholesterol in your diet to less than _________ mg per day. You limit the amount of sodium in your diet to less than _________ mg per day. What are tips for following this plan? Cooking Cook foods using methods other than frying. Baking, boiling, grilling, and broiling are all good options. Other ways to reduce fat include: Removing the skin from poultry. Removing all visible fats from meats. Steaming vegetables in water or broth. Meal planning  At meals, imagine dividing your plate into fourths: Fill one-half of your plate with vegetables and green salads. Fill one-fourth of your plate with whole grains. Fill one-fourth of your plate with lean protein foods. Eat 2-4 cups of vegetables per day. One cup of vegetables equals 1 cup (91 g) broccoli or cauliflower florets, 2 medium carrots, 1 large bell pepper, 1 large sweet potato, 1 large tomato, 1 medium white potato, 2 cups (150 g) raw leafy greens. Eat 1-2 cups of fruit per day. One cup of fruit equals 1 small apple, 1 large banana, 1 cup (237 g) mixed fruit, 1 large orange,  cup (82 g) dried fruit, 1 cup (240 mL) 100% fruit juice. Eat more foods that contain soluble fiber. Examples include apples, broccoli, carrots, beans,  peas, and barley. Aim to get 25-30 g of fiber per day. Increase your consumption of legumes, nuts, and seeds to 4-5 servings per week. One serving of dried beans or legumes equals  cup (90 g) cooked, 1 serving of nuts is  oz (12 almonds, 24 pistachios, or 7 walnut halves), and 1 serving of seeds equals  oz (8 g). Fats Choose healthy fats more often. Choose monounsaturated and polyunsaturated fats, such as olive and canola oils, avocado oil, flaxseeds, walnuts, almonds, and seeds. Eat more omega-3 fats. Choose salmon, mackerel, sardines, tuna, flaxseed oil, and ground flaxseeds. Aim to eat fish at least 2 times each week. Check food labels carefully to identify foods with trans fats or high amounts of saturated fat. Limit saturated fats. These are found in animal products, such as meats, butter, and cream. Plant sources of saturated fats include palm oil, palm kernel oil, and coconut oil. Avoid foods with partially hydrogenated oils in them. These contain trans fats. Examples are stick margarine, some tub margarines, cookies, crackers, and other baked goods. Avoid fried foods. General information Eat more home-cooked food and less restaurant, buffet, and fast food. Limit or avoid alcohol. Limit foods that are high in added sugar and simple starches such as foods made using white refined flour (white breads, pastries, sweets). Lose weight if you are overweight. Losing just 5-10% of your body weight can help your overall health and prevent diseases such as diabetes and heart disease. Monitor your sodium intake, especially if you have high blood pressure. Talk with your health care provider about your sodium intake. Try to incorporate more  vegetarian meals weekly. What foods should I eat? Fruits All fresh, canned (in natural juice), or frozen fruits. Vegetables Fresh or frozen vegetables (raw, steamed, roasted, or grilled). Green salads. Grains Most grains. Choose whole wheat and whole grains  most of the time. Rice and pasta, including brown rice and pastas made with whole wheat. Meats and other proteins Lean, well-trimmed beef, veal, pork, and lamb. Chicken and [place] without skin. All fish and shellfish. Wild duck, rabbit, pheasant, and venison. Egg whites or low-cholesterol egg substitutes. Dried beans, peas, lentils, and tofu. Seeds and most nuts. Dairy Low-fat or nonfat cheeses, including ricotta and mozzarella. Skim or 1% milk (liquid, powdered, or evaporated). Buttermilk made with low-fat milk. Nonfat or low-fat yogurt. Fats and oils Non-hydrogenated (trans-free) margarines. Vegetable oils, including soybean, sesame, sunflower, olive, avocado, peanut, safflower, corn, canola, and cottonseed. Salad dressings or mayonnaise made with a vegetable oil. Beverages Water (mineral or sparkling). Coffee and tea. Unsweetened ice tea. Diet beverages. Sweets and desserts Sherbet, gelatin, and fruit ice. Small amounts of dark chocolate. Limit all sweets and desserts. Seasonings and condiments All seasonings and condiments. The items listed above may not be a complete list of foods and beverages you can eat. Contact a dietitian for more options. What foods should I avoid? Fruits Canned fruit in heavy syrup. Fruit in cream or butter sauce. Fried fruit. Limit coconut. Vegetables Vegetables cooked in cheese, cream, or butter sauce. Fried vegetables. Grains Breads made with saturated or trans fats, oils, or whole milk. Croissants. Sweet rolls. Donuts. High-fat crackers, such as cheese crackers and chips. Meats and other proteins Fatty meats, such as hot dogs, ribs, sausage, bacon, rib-eye roast or steak. High-fat deli meats, such as salami and bologna. Caviar. Domestic duck and goose. Organ meats, such as liver. Dairy Cream, sour cream, cream cheese, and creamed cottage cheese. Whole-milk cheeses. Whole or 2% milk (liquid, evaporated, or condensed). Whole buttermilk. Cream sauce or high-fat  cheese sauce. Whole-milk yogurt. Fats and oils Meat fat, or shortening. Cocoa butter, hydrogenated oils, palm oil, coconut oil, palm kernel oil. Solid fats and shortenings, including bacon fat, salt pork, lard, and butter. Nondairy cream substitutes. Salad dressings with cheese or sour cream. Beverages Regular sodas and any drinks with added sugar. Sweets and desserts Frosting. Pudding. Cookies. Cakes. Pies. Milk chocolate or white chocolate. Buttered syrups. Full-fat ice cream or ice cream drinks. The items listed above may not be a complete list of foods and beverages to avoid. Contact a dietitian for more information. Summary Heart-healthy meal planning includes limiting unhealthy fats, increasing healthy fats, limiting salt (sodium) intake and making other diet and lifestyle changes. Lose weight if you are overweight. Losing just 5-10% of your body weight can help your overall health and prevent diseases such as diabetes and heart disease. Focus on eating a balance of foods, including fruits and vegetables, low-fat or nonfat dairy, lean protein, nuts and legumes, whole grains, and heart-healthy oils and fats. This information is not intended to replace advice given to you by your health care provider. Make sure you discuss any questions you have with your health care provider. Document Revised: 02/18/2021 Document Reviewed: 02/18/2021 Elsevier Patient Education  2024 [employer].

## 2023-03-17 ENCOUNTER — Ambulatory Visit (INDEPENDENT_AMBULATORY_CARE_PROVIDER_SITE_OTHER): Payer: 59 | Admitting: Nurse Practitioner

## 2023-03-17 ENCOUNTER — Encounter: Payer: Self-pay | Admitting: Nurse Practitioner

## 2023-03-17 VITALS — BP 134/72 | HR 77 | Temp 97.8°F | Ht 71.0 in | Wt 198.4 lb

## 2023-03-17 DIAGNOSIS — D649 Anemia, unspecified: Secondary | ICD-10-CM

## 2023-03-17 DIAGNOSIS — D696 Thrombocytopenia, unspecified: Secondary | ICD-10-CM

## 2023-03-17 DIAGNOSIS — N183 Chronic kidney disease, stage 3 unspecified: Secondary | ICD-10-CM

## 2023-03-17 DIAGNOSIS — E1169 Type 2 diabetes mellitus with other specified complication: Secondary | ICD-10-CM

## 2023-03-17 DIAGNOSIS — K219 Gastro-esophageal reflux disease without esophagitis: Secondary | ICD-10-CM | POA: Diagnosis not present

## 2023-03-17 DIAGNOSIS — R2990 Unspecified symptoms and signs involving the nervous system: Secondary | ICD-10-CM

## 2023-03-17 DIAGNOSIS — M25522 Pain in left elbow: Secondary | ICD-10-CM | POA: Insufficient documentation

## 2023-03-17 DIAGNOSIS — I152 Hypertension secondary to endocrine disorders: Secondary | ICD-10-CM | POA: Diagnosis not present

## 2023-03-17 DIAGNOSIS — E1159 Type 2 diabetes mellitus with other circulatory complications: Secondary | ICD-10-CM

## 2023-03-17 DIAGNOSIS — E1129 Type 2 diabetes mellitus with other diabetic kidney complication: Secondary | ICD-10-CM

## 2023-03-17 DIAGNOSIS — F10930 Alcohol use, unspecified with withdrawal, uncomplicated: Secondary | ICD-10-CM | POA: Diagnosis not present

## 2023-03-17 DIAGNOSIS — F1010 Alcohol abuse, uncomplicated: Secondary | ICD-10-CM

## 2023-03-17 DIAGNOSIS — E1122 Type 2 diabetes mellitus with diabetic chronic kidney disease: Secondary | ICD-10-CM

## 2023-03-17 DIAGNOSIS — E785 Hyperlipidemia, unspecified: Secondary | ICD-10-CM

## 2023-03-17 DIAGNOSIS — R7989 Other specified abnormal findings of blood chemistry: Secondary | ICD-10-CM

## 2023-03-17 DIAGNOSIS — E1140 Type 2 diabetes mellitus with diabetic neuropathy, unspecified: Secondary | ICD-10-CM

## 2023-03-17 MED ORDER — GABAPENTIN 300 MG PO CAPS
ORAL_CAPSULE | ORAL | 4 refills | Status: DC
  Filled 2023-03-20: qty 360, 90d supply, fill #0
  Filled 2023-04-01: qty 120, 30d supply, fill #0
  Filled 2023-04-20 – 2023-05-25 (×4): qty 360, 90d supply, fill #0

## 2023-03-17 MED ORDER — PANTOPRAZOLE SODIUM 40 MG PO TBEC
40.0000 mg | DELAYED_RELEASE_TABLET | Freq: Every day | ORAL | 4 refills | Status: DC
  Filled 2023-04-21 – 2023-05-04 (×3): qty 90, 90d supply, fill #0
  Filled 2023-05-25: qty 30, 30d supply, fill #0

## 2023-03-17 NOTE — Assessment & Plan Note (Signed)
 Chronic, ongoing.  Continue current medication regimen and adjust as needed. Lipid panel today.

## 2023-03-17 NOTE — Assessment & Plan Note (Signed)
 Acute for weeks after fall on left elbow.  Obtain imaging and determine next steps after these return.  Recommend he apply Voltaren gel to elbow daily as needed.  Wear elbow sleeve for support.  Consider steroid injection in future if ongoing pain.

## 2023-03-17 NOTE — Assessment & Plan Note (Signed)
Chronic, ongoing with occasional low mag level.  Recheck today and adjust regimen as needed.  Risks of PPI use were discussed with patient including bone loss, C. Diff diarrhea, pneumonia, infections, CKD, electrolyte abnormalities.  Verbalizes understanding and chooses to continue the medication.

## 2023-03-17 NOTE — Progress Notes (Signed)
 BP 134/72   Pulse 77   Temp 97.8 F (36.6 C) (Oral)   Ht 5\' 11"  (1.803 m)   Wt 198 lb 6.4 oz (90 kg)   SpO2 96%   BMI 27.67 kg/m    Subjective:    Patient ID: George Gomez, male    DOB: July 07, 1960, 63 y.o.   MRN: [id number]  HPI: George Gomez is a 63 y.o. male  Chief Complaint  Patient presents with   Chronic Kidney Disease   Diabetes    No recent eye exam per patient   Hypertension   Transition of Care Hospital Follow up.  Admitted to [facility] on 02/20/23 and discharged on 02/22/23 for stroke-like symptoms.  CT noted possible lacunar infarct, but MRI showed no acute findings.  Prior to hospitalization was over at friends house and he passed out.  At time his feet were shaking he reports and his friends called rescue.  He reports having one drink while over at friends house. Reports falling last month as well and hit left elbow on the ground.  Continues to have pain to elbow area radiating to fingers.    At present he reports being fatigued, but denies any further syncopal episodes. Continues to endorse daily alcohol use.  "Brief/Interim Summary: HPI was taken from George Gomez: George Gomez is a 63 year old male with history of alcohol use disorder, alcohol dependence, hypertension, history of CVA in the left lacunar, presents emergency department for chief concerns of right-sided facial droop.   Vitals in the ED showed temperature 97.7, respiration rate 21, heart rate of 110, blood pressure 144/99, SpO2 98% on room air.   Serum sodium is 140, potassium 4.0, chloride 102, bicarb 11, BUN of 8, serum creatinine of 0.97, EGFR greater than 60, nonfasting blood glucose 119, WBC 4.9, hemoglobin 9.1, platelets of 145.   Code stroke was called upon patient arrival to the ED.  Neurology has been consulted.   ED treatment: None. ------------------------------- At bedside, patient able to tell me his first and last name, age, location, current calendar year.  He first told me it was  2024 and then corrected and states is 25.   He knows he is in the hospital.   He reports his last drink was 1 cup of vodka yesterday on 02/19/2023.  He did not have any vodka on 02/18/2023 due to not feeling well.   Patient reports he normally drinks about 1 cup of vodka per day, about 8 to 10 ounces.  He sometimes makes with cranberry juice.   He denies trauma to his person.  He denies nausea, vomiting, dysuria, hematuria.  He reports he has been having diarrhea for the last 2 days, 2 episodes each day, brown watery stool.  He denies recent antibiotic use.   Discharge Diagnoses:  Principal Problem:   Stroke-like symptom Active Problems:   Alcohol abuse   Thrombocytopenia (HCC)   Elevated LFTs   Hyperlipidemia due to type 2 diabetes mellitus (HCC)   Hypertension associated with type 2 diabetes mellitus (HCC)   Gastroesophageal reflux disease   Type 2 diabetes mellitus with diabetic neuropathy (HCC)   Type 2 diabetes mellitus with proteinuria (HCC)   Hypomagnesemia   Hepatic steatosis   Seizure due to alcohol withdrawal (HCC)   Alcohol withdrawal (HCC) Stroke-like symptom: but CVA r/o w/ MRI brain. Echo shows EF 60-65%, grade I diastolic dysfunction, no regional wall motion abnormalities, no atrial shunts. Neuro recs apprec    Seizure: due to alcohol withdrawal.  Continue on CIWA protocol. Alcohol level 25. No need for seizure medication at this time.    Hypomagnesemia: mag sulfate given    DM2: well controlled, HbA1c 5.4. Continue on SSI w/ accuchekcs    HTN: continue on home dose of propranolol    Transaminitis: resolved    Thrombocytopenia: likely secondary to alcohol abuse.    Alcohol abuse: continue on CIWA protocol. Alcohol level 25. Drinks 3 shots of 3 liquor daily. Received alcohol cessation counseling."  Hospital/Facility: [facility] D/C Physician: George Gomez  D/C Date: 02/22/23  Records Requested: 03/17/23 Records Received: 03/17/23 Records Reviewed:  03/17/23  Diagnoses on Discharge: Stroke-like symptoms  Date of interactive Contact within 48 hours of discharge:  Contact was through: phone  Date of 7 day or 14 day face-to-face visit:  has been >14 days  Outpatient Encounter Medications as of 03/17/2023  Medication Sig Note   allopurinol (ZYLOPRIM) 300 MG tablet Take 1 tablet (300 mg total) by mouth daily.    atorvastatin (LIPITOR) 40 MG tablet Take 1 tablet (40 mg total) by mouth daily.    Cholecalciferol 50 MCG (2000 UT) CAPS Take 1 capsule by mouth daily.    cyanocobalamin (VITAMIN B12) 1000 MCG tablet Take 1 tablet (1,000 mcg total) by mouth daily.    magnesium oxide (MAG-OX) 400 (240 Mg) MG tablet Take 1 tablet (400 mg total) by mouth 2 (two) times daily.    ondansetron (ZOFRAN) 4 MG tablet Take 1 tablet (4 mg total) by mouth every 8 (eight) hours as needed. 02/24/2023: Denies N/V   propranolol ER (INDERAL LA) 60 MG 24 hr capsule Take 1 capsule (60 mg total) by mouth daily.    tiZANidine (ZANAFLEX) 2 MG tablet TAKE 1 TABLET(2 MG) BY MOUTH EVERY 8 HOURS AS NEEDED FOR MUSCLE SPASMS    triamcinolone cream (KENALOG) 0.1 % Apply 1 application topically 2 (two) times daily.    VOLTAREN 1 % GEL     [DISCONTINUED] gabapentin (NEURONTIN) 300 MG capsule Take one tablet (300 MG) by mouth in the morning, then take one tablet (300 MG) by mouth at noon, and then take 600 MG (two tablets) by mouth before bedtime. 03/04/2023: Takes "sometimes"   [DISCONTINUED] pantoprazole (PROTONIX) 40 MG tablet Take 1 tablet (40 mg total) by mouth daily.    Blood Glucose Monitoring Suppl (ONETOUCH VERIO) w/Device KIT Use to check blood sugar 3 times a day and document results, bring to appointments.  Goal is <130 fasting blood sugar and <180 two hours after meals. (Patient not taking: Reported on 03/04/2023) 02/24/2023: Has but does not use    gabapentin (NEURONTIN) 300 MG capsule Take one tablet (300 MG) by mouth in the morning, then take one tablet (300 MG) by mouth at  noon, and then take 600 MG (two tablets) by mouth before bedtime.    glucose blood (ONETOUCH VERIO) test strip Use to check blood sugar 3 times a day and document results, bring to appointments.  Goal is <130 fasting blood sugar and <180 two hours after meals. (Patient not taking: Reported on 02/17/2023)    Lancets (ONETOUCH ULTRASOFT) lancets Use to check blood sugar 3 times a day and document results, bring to appointments.  Goal is <130 fasting blood sugar and <180 two hours after meals. (Patient not taking: Reported on 03/04/2023) 02/24/2023: Not currently checking BG    pantoprazole (PROTONIX) 40 MG tablet Take 1 tablet (40 mg total) by mouth daily.    [DISCONTINUED] potassium chloride (KLOR-CON M) 10 MEQ tablet  Take 2 tablets (20 mEq total) by mouth daily for 5 days. (Patient not taking: Reported on 02/17/2023)    No facility-administered encounter medications on file as of 03/17/2023.    Diagnostic Tests Reviewed/Disposition:     Latest Ref Rng & Units 02/22/2023    3:38 AM 02/20/2023   11:00 AM 01/14/2023   11:22 AM  CBC  WBC 4.0 - 10.5 K/uL 4.2  4.9  2.5   Hemoglobin 13.0 - 17.0 g/dL 8.5  9.1  52.8   Hematocrit 39.0 - 52.0 % 26.4  29.2  32.5   Platelets 150 - 400 K/uL 117  145  173     CMP     Component Value Date/Time   NA 134 (L) 02/22/2023 0338   NA 137 02/09/2023 0933   NA 141 09/19/2013 2009   K 3.3 (L) 02/22/2023 0338   K 3.4 (L) 09/19/2013 2009   CL 100 02/22/2023 0338   CL 107 09/19/2013 2009   CO2 20 (L) 02/22/2023 0338   CO2 22 09/19/2013 2009   GLUCOSE 90 02/22/2023 0338   GLUCOSE 121 (H) 09/19/2013 2009   BUN 14 02/22/2023 0338   BUN 10 02/09/2023 0933   BUN 18 09/19/2013 2009   CREATININE 0.91 02/22/2023 0338   CREATININE 1.25 09/19/2013 2009   CALCIUM 8.3 (L) 02/22/2023 0338   CALCIUM 8.1 (L) 09/19/2013 2009   PROT 6.9 02/22/2023 0338   PROT 6.8 01/23/2023 0835   PROT 8.1 09/19/2013 2009   ALBUMIN 3.6 02/22/2023 0338   ALBUMIN 4.2 01/23/2023 0835    ALBUMIN 4.1 09/19/2013 2009   AST 37 02/22/2023 0338   AST 50 (H) 09/19/2013 2009   ALT 14 02/22/2023 0338   ALT 32 09/19/2013 2009   ALKPHOS 80 02/22/2023 0338   ALKPHOS 85 09/19/2013 2009   BILITOT 1.5 (H) 02/22/2023 0338   BILITOT 0.3 01/23/2023 0835   BILITOT 0.6 09/19/2013 2009   EGFR 72 02/09/2023 0933   GFRNONAA >60 02/22/2023 0338   GFRNONAA >60 09/19/2013 2009    Consults: neurology  Discharge Instructions:  Follow up with PCP in 1-2 weeks F/u w/ neuro, Dr. Sherryll Burger or Dr. Malvin Johns, in 8-12 weeks  Disease/illness Education: Reviewed TIA and stroke-like symptoms at length with patient  Home Health/Community Services Discussions/Referrals: none  Establishment or re-establishment of referral orders for community resources: none  Discussion with other health care providers: reviewed all recent notes in chart  Assessment and Support of treatment regimen adherence: reviewed at length with patient today  Appointments Coordinated with: reviewed at length with patient today, reports no neurology appointment and has not heard from them  Education for self-management, independent living, and ADLs:  reviewed at length with patient today  DIABETES A1c in January 5.4%.  Takes Gabapentin for neuropathy, has occasional pain at night and forgets to take Gabapentin.  Not taking B12 shots or oral supplement as recommended, recent level 374.   Hypoglycemic episodes:no Polydipsia/polyuria: no Visual disturbance: no Chest pain: no Paresthesias: no Glucose Monitoring: no  Accucheck frequency: Not Checking  Fasting glucose:  Post prandial:  Evening:  Before meals: Taking Insulin?: no  Long acting insulin:  Short acting insulin: Blood Pressure Monitoring: not checking Retinal Examination: Not up to Date -- [facility] Foot Exam: Up to Date Pneumovax: Up To Date Influenza: Up to Date Aspirin: no   HYPERTENSION / HYPERLIPIDEMIA Taking Propranolol + Atorvastatin. Previously took ACE  and ARB, both with severe reactions.  Took Amlodipine in past.   Drinks  a shot or two of liquor daily, vodka, mostly in the evening. Have noticed elevation in LFTs on labs in past. Satisfied with current treatment? yes Duration of hypertension: chronic BP monitoring frequency: occasional BP range: 130/70 range BP medication side effects: no Duration of hyperlipidemia: chronic Cholesterol medication side effects: no Cholesterol supplements: none Medication compliance: good compliance Aspirin: no Recent stressors: no Recurrent headaches: no Visual changes: no Palpitations: no Dyspnea: no Chest pain: no Lower extremity edema: occasional he reports to both legs Dizzy/lightheaded: no  CHRONIC KIDNEY DISEASE CKD status: stable Medications renally dose: yes Previous renal evaluation: no Pneumovax:  Up to Date Influenza Vaccine:  Up to Date  ANEMIA Follows with hematology, last office visit was 10/15/22 -- no current B12 shots or iron infusions.  Saw GI on 12/30/22 last with plan for colonoscopy in February.  Continues Protonix daily for GERD.  Low magnesium levels and takes supplement for this. Anemia status: stable Etiology of anemia: B12 and iron deficiency Duration of anemia treatment: via hematology Compliance with treatment: good compliance Iron supplementation side effects: no Severity of anemia: moderate Fatigue: no Decreased exercise tolerance: no  Dyspnea on exertion: no Palpitations: no Bleeding: no Pica: no   Relevant past medical, surgical, family and social history reviewed and updated as indicated. Interim medical history since our last visit reviewed. Allergies and medications reviewed and updated.  Review of Systems  Constitutional:  Negative for activity change, diaphoresis, fatigue and fever.  Respiratory:  Negative for cough, chest tightness, shortness of breath and wheezing.   Cardiovascular:  Negative for chest pain, palpitations and leg swelling.   Endocrine: Negative for cold intolerance, heat intolerance, polydipsia, polyphagia and polyuria.  Musculoskeletal:  Positive for arthralgias.  Neurological:  Positive for weakness (occasional). Negative for dizziness, tremors, syncope, facial asymmetry, light-headedness, numbness and headaches.  Psychiatric/Behavioral: Negative.     Per HPI unless specifically indicated above     Objective:    BP 134/72   Pulse 77   Temp 97.8 F (36.6 C) (Oral)   Ht 5\' 11"  (1.803 m)   Wt 198 lb 6.4 oz (90 kg)   SpO2 96%   BMI 27.67 kg/m   Wt Readings from Last 3 Encounters:  03/17/23 198 lb 6.4 oz (90 kg)  02/17/23 193 lb (87.5 kg)  01/23/23 201 lb 12.8 oz (91.5 kg)    Physical Exam Vitals and nursing note reviewed.  Constitutional:      General: He is awake. He is not in acute distress.    Appearance: He is well-developed and well-groomed. He is obese. He is not ill-appearing or toxic-appearing.  HENT:     Head: Normocephalic and atraumatic.     Right Ear: Hearing and external ear normal. No drainage.     Left Ear: Hearing and external ear normal. No drainage.  Eyes:     General: Lids are normal.        Right eye: No discharge.        Left eye: No discharge.     Conjunctiva/sclera: Conjunctivae normal.     Pupils: Pupils are equal, round, and reactive to light.  Neck:     Thyroid: No thyromegaly.     Vascular: No carotid bruit.  Cardiovascular:     Rate and Rhythm: Normal rate and regular rhythm.     Heart sounds: Normal heart sounds, S1 normal and S2 normal. No murmur heard.    No gallop.  Pulmonary:     Effort: Pulmonary effort is normal. No  accessory muscle usage or respiratory distress.     Breath sounds: Normal breath sounds.  Abdominal:     General: Bowel sounds are normal.     Palpations: Abdomen is soft.  Musculoskeletal:     Right elbow: Normal.     Left elbow: Swelling (mild) and laceration (to elbow with crusting) present. Normal range of motion. Tenderness present  in lateral epicondyle and olecranon process.     Cervical back: Normal range of motion and neck supple.     Right lower leg: No edema.     Left lower leg: No edema.  Lymphadenopathy:     Cervical: No cervical adenopathy.  Skin:    General: Skin is warm and dry.     Capillary Refill: Capillary refill takes less than 2 seconds.     Findings: No rash.  Neurological:     Mental Status: He is alert and oriented to person, place, and time.     Cranial Nerves: Cranial nerves 2-12 are intact.     Sensory: Sensation is intact.     Coordination: Coordination is intact.     Gait: Gait is intact.     Deep Tendon Reflexes: Reflexes are normal and symmetric.     Reflex Scores:      Brachioradialis reflexes are 2+ on the right side and 2+ on the left side.      Patellar reflexes are 2+ on the right side and 2+ on the left side. Psychiatric:        Attention and Perception: Attention normal.        Mood and Affect: Mood normal.        Speech: Speech normal.        Behavior: Behavior normal. Behavior is cooperative.        Thought Content: Thought content normal.    Results for orders placed or performed during the hospital encounter of 02/20/23  Protime-INR   Collection Time: 02/20/23 11:00 AM  Result Value Ref Range   Prothrombin Time 15.0 11.4 - 15.2 seconds   INR 1.2 0.8 - 1.2  APTT   Collection Time: 02/20/23 11:00 AM  Result Value Ref Range   aPTT 28 24 - 36 seconds  CBC   Collection Time: 02/20/23 11:00 AM  Result Value Ref Range   WBC 4.9 4.0 - 10.5 K/uL   RBC 2.96 (L) 4.22 - 5.81 MIL/uL   Hemoglobin 9.1 (L) 13.0 - 17.0 g/dL   HCT 16.1 (L) 09.6 - 04.5 %   MCV 98.6 80.0 - 100.0 fL   MCH 30.7 26.0 - 34.0 pg   MCHC 31.2 30.0 - 36.0 g/dL   RDW 40.9 (H) 81.1 - 91.4 %   Platelets 145 (L) 150 - 400 K/uL   nRBC 1.0 (H) 0.0 - 0.2 %  Differential   Collection Time: 02/20/23 11:00 AM  Result Value Ref Range   Neutrophils Relative % 61 %   Neutro Abs 3.0 1.7 - 7.7 K/uL    Lymphocytes Relative 25 %   Lymphs Abs 1.2 0.7 - 4.0 K/uL   Monocytes Relative 12 %   Monocytes Absolute 0.6 0.1 - 1.0 K/uL   Eosinophils Relative 0 %   Eosinophils Absolute 0.0 0.0 - 0.5 K/uL   Basophils Relative 1 %   Basophils Absolute 0.1 0.0 - 0.1 K/uL   Immature Granulocytes 1 %   Abs Immature Granulocytes 0.06 0.00 - 0.07 K/uL  Comprehensive metabolic panel   Collection Time: 02/20/23 11:00 AM  Result Value  Ref Range   Sodium 140 135 - 145 mmol/L   Potassium 4.0 3.5 - 5.1 mmol/L   Chloride 102 98 - 111 mmol/L   CO2 11 (L) 22 - 32 mmol/L   Glucose, Bld 119 (H) 70 - 99 mg/dL   BUN 8 8 - 23 mg/dL   Creatinine, Ser 4.09 0.61 - 1.24 mg/dL   Calcium 7.9 (L) 8.9 - 10.3 mg/dL   Total Protein 7.4 6.5 - 8.1 g/dL   Albumin 3.8 3.5 - 5.0 g/dL   AST 811 (H) 15 - 41 U/L   ALT 19 0 - 44 U/L   Alkaline Phosphatase 94 38 - 126 U/L   Total Bilirubin 1.2 0.0 - 1.2 mg/dL   GFR, Estimated >91 >47 mL/min   Anion gap 27 (H) 5 - 15  Procalcitonin   Collection Time: 02/20/23 11:00 AM  Result Value Ref Range   Procalcitonin <0.10 ng/mL  Magnesium   Collection Time: 02/20/23 11:00 AM  Result Value Ref Range   Magnesium 1.1 (L) 1.7 - 2.4 mg/dL  Phosphorus   Collection Time: 02/20/23 11:00 AM  Result Value Ref Range   Phosphorus 4.3 2.5 - 4.6 mg/dL  CBG monitoring, ED   Collection Time: 02/20/23 11:04 AM  Result Value Ref Range   Glucose-Capillary 110 (H) 70 - 99 mg/dL  Ethanol   Collection Time: 02/20/23 11:46 AM  Result Value Ref Range   Alcohol, Ethyl (B) 25 (H) <10 mg/dL  CBG monitoring, ED   Collection Time: 02/20/23  7:47 PM  Result Value Ref Range   Glucose-Capillary 98 70 - 99 mg/dL  CBG monitoring, ED   Collection Time: 02/20/23  9:33 PM  Result Value Ref Range   Glucose-Capillary 78 70 - 99 mg/dL  Urine Drug Screen, Qualitative ([facility] only)   Collection Time: 02/21/23  1:55 AM  Result Value Ref Range   Tricyclic, Ur Screen NONE DETECTED NONE DETECTED   Amphetamines, Ur  Screen NONE DETECTED NONE DETECTED   MDMA (Ecstasy)Ur Screen NONE DETECTED NONE DETECTED   Cocaine Metabolite,Ur [place] NONE DETECTED NONE DETECTED   Opiate, Ur Screen NONE DETECTED NONE DETECTED   Phencyclidine (PCP) Ur S NONE DETECTED NONE DETECTED   Cannabinoid 50 Ng, Ur [place] NONE DETECTED NONE DETECTED   Barbiturates, Ur Screen NONE DETECTED NONE DETECTED   Benzodiazepine, Ur Scrn NONE DETECTED NONE DETECTED   Methadone Scn, Ur NONE DETECTED NONE DETECTED  Lipid panel   Collection Time: 02/21/23  2:57 AM  Result Value Ref Range   Cholesterol 211 (H) 0 - 200 mg/dL   Triglycerides 84 <829 mg/dL   HDL 562 >13 mg/dL   Total CHOL/HDL Ratio 1.9 RATIO   VLDL 17 0 - 40 mg/dL   LDL Cholesterol 80 0 - 99 mg/dL  Hemoglobin Y8M   Collection Time: 02/21/23  2:57 AM  Result Value Ref Range   Hgb A1c MFr Bld 5.4 4.8 - 5.6 %   Mean Plasma Glucose 108.28 mg/dL  Magnesium   Collection Time: 02/21/23  2:57 AM  Result Value Ref Range   Magnesium 1.8 1.7 - 2.4 mg/dL  CBG monitoring, ED   Collection Time: 02/21/23  6:40 AM  Result Value Ref Range   Glucose-Capillary 86 70 - 99 mg/dL  CBG monitoring, ED   Collection Time: 02/21/23  8:06 AM  Result Value Ref Range   Glucose-Capillary 68 (L) 70 - 99 mg/dL   Comment 1 Notify RN   ECHOCARDIOGRAM COMPLETE   Collection Time: 02/21/23  1:00 PM  Result Value Ref Range   BP 133/81 mmHg   Ao pk vel 1.09 m/s   AR max vel 2.78 cm2   AV Peak grad 4.8 mmHg   S' Lateral 3.20 cm   Area-P 1/2 4.74 cm2   Est EF 60 - 65%   CBG monitoring, ED   Collection Time: 02/21/23  3:50 PM  Result Value Ref Range   Glucose-Capillary 87 70 - 99 mg/dL  CBG monitoring, ED   Collection Time: 02/21/23  9:07 PM  Result Value Ref Range   Glucose-Capillary 97 70 - 99 mg/dL  CBC   Collection Time: 02/22/23  3:38 AM  Result Value Ref Range   WBC 4.2 4.0 - 10.5 K/uL   RBC 2.76 (L) 4.22 - 5.81 MIL/uL   Hemoglobin 8.5 (L) 13.0 - 17.0 g/dL   HCT 95.1 (L) 88.4 - 16.6 %   MCV  95.7 80.0 - 100.0 fL   MCH 30.8 26.0 - 34.0 pg   MCHC 32.2 30.0 - 36.0 g/dL   RDW 06.3 (H) 01.6 - 01.0 %   Platelets 117 (L) 150 - 400 K/uL   nRBC 0.5 (H) 0.0 - 0.2 %  Comprehensive metabolic panel   Collection Time: 02/22/23  3:38 AM  Result Value Ref Range   Sodium 134 (L) 135 - 145 mmol/L   Potassium 3.3 (L) 3.5 - 5.1 mmol/L   Chloride 100 98 - 111 mmol/L   CO2 20 (L) 22 - 32 mmol/L   Glucose, Bld 90 70 - 99 mg/dL   BUN 14 8 - 23 mg/dL   Creatinine, Ser 9.32 0.61 - 1.24 mg/dL   Calcium 8.3 (L) 8.9 - 10.3 mg/dL   Total Protein 6.9 6.5 - 8.1 g/dL   Albumin 3.6 3.5 - 5.0 g/dL   AST 37 15 - 41 U/L   ALT 14 0 - 44 U/L   Alkaline Phosphatase 80 38 - 126 U/L   Total Bilirubin 1.5 (H) 0.0 - 1.2 mg/dL   GFR, Estimated >35 >57 mL/min   Anion gap 14 5 - 15  Magnesium   Collection Time: 02/22/23  3:38 AM  Result Value Ref Range   Magnesium 1.6 (L) 1.7 - 2.4 mg/dL  Glucose, capillary   Collection Time: 02/22/23  8:23 AM  Result Value Ref Range   Glucose-Capillary 87 70 - 99 mg/dL  Glucose, capillary   Collection Time: 02/22/23 12:35 PM  Result Value Ref Range   Glucose-Capillary 112 (H) 70 - 99 mg/dL      Assessment & Plan:   Problem List Items Addressed This Visit       Cardiovascular and Mediastinum   Hypertension associated with type 2 diabetes mellitus (HCC)   Chronic, stable.  BP at goal in office today.  Recommend he monitor BP at least a few mornings a week at home and document.  DASH diet at home.  Continue current medication regimen and adjust as needed, Propranolol only at this time.  Labs today: CMP, CBC.  Urine ALB 80 March 2024, can not take ACE or ARB.          Relevant Orders   Comprehensive metabolic panel   CBC with Differential/Platelet     Digestive   Gastroesophageal reflux disease   Chronic, ongoing with occasional low mag level.  Recheck today and adjust regimen as needed.  Risks of PPI use were discussed with patient including bone loss, C. Diff  diarrhea, pneumonia, infections, CKD, electrolyte abnormalities.  Verbalizes  understanding and chooses to continue the medication.       Relevant Medications   pantoprazole (PROTONIX) 40 MG tablet     Endocrine   CKD stage 3 due to type 2 diabetes mellitus (HCC)   Chronic, ongoing with A1c 5.4% in January and remains off Metformin. Urine ALB 80 (March 2024), can not take ACE or ARB.  Continue Gabapentin for neuropathy pain.  Recommend he check BS 3 days a week and document for provider visits.   - Foot exam up to date - Needs to schedule eye exam - Vaccines up to date - Statin on board.      Relevant Orders   Comprehensive metabolic panel   CBC with Differential/Platelet   Hyperlipidemia due to type 2 diabetes mellitus (HCC)   Chronic, ongoing.  Continue current medication regimen and adjust as needed.  Lipid panel today.        Type 2 diabetes mellitus with diabetic neuropathy (HCC)   Chronic, ongoing with A1c 5.4% in January.  At this time continue diet focus and restart Metformin as needed.  Urine ALB 80 (March 2024), can not take ACE or ARB.  Continue Gabapentin for neuropathy pain + recommend he restart B12 and place Voltaren gel on feet at night.  Recommend he check BS 3 days a week and document for provider visits.   - Foot exam up to date - Needs to schedule eye exam - Vaccines up to date - Statin on board.      Relevant Medications   gabapentin (NEURONTIN) 300 MG capsule     Nervous and Auditory   Seizure due to alcohol withdrawal (HCC) - Primary   One episode recently, he denies any further since leaving hospital.  Recommend complete cessation of alcohol use by utilizing a slow reduction.        Relevant Medications   gabapentin (NEURONTIN) 300 MG capsule   Other Relevant Orders   Comprehensive metabolic panel     Hematopoietic and Hemostatic   Thrombocytopenia (HCC)   Relevant Orders   CBC with Differential/Platelet     Other   Alcohol abuse   Chronic, he  reports a few shots vodka daily - denies any heavier use.  Check CMP today and consider imaging if elevations.  Recommend working towards complete cessation of alcohol use.  Continue to collaborate with GI.      Relevant Orders   Comprehensive metabolic panel   Elbow pain, left   Acute for weeks after fall on left elbow.  Obtain imaging and determine next steps after these return.  Recommend he apply Voltaren gel to elbow daily as needed.  Wear elbow sleeve for support.  Consider steroid injection in future if ongoing pain.      Relevant Orders   DG Elbow Complete Left   DG Forearm Left   Hypomagnesemia   Recheck on labs today, taking supplement daily as instructed.  Is on PPI therapy which needs to be maintained at this time.  Recheck level today.      Relevant Orders   Magnesium   Normocytic anemia   Chronic, ongoing.  Followed by hematology and recent notes reviewed.  Continue to collaborate with them.      Stroke-like symptom   Recent hospitalization for this.  To follow-up with neurology, recommend he schedule.  Discussed need for reduction and complete cessation of alcohol use.  He denies any current symptoms or further syncopal episodes.        Follow up plan:  Return in about 2 weeks (around 03/31/2023) for Elbow Pain.

## 2023-03-17 NOTE — Assessment & Plan Note (Signed)
 Chronic, he reports a few shots vodka daily - denies any heavier use.  Check CMP today and consider imaging if elevations.  Recommend working towards complete cessation of alcohol use.  Continue to collaborate with GI.

## 2023-03-17 NOTE — Assessment & Plan Note (Signed)
 One episode recently, he denies any further since leaving hospital.  Recommend complete cessation of alcohol use by utilizing a slow reduction.

## 2023-03-17 NOTE — Assessment & Plan Note (Signed)
 Chronic, ongoing with A1c 5.4% in January and remains off Metformin. Urine ALB 80 (March 2024), can not take ACE or ARB.  Continue Gabapentin for neuropathy pain.  Recommend he check BS 3 days a week and document for provider visits.   - Foot exam up to date - Needs to schedule eye exam - Vaccines up to date - Statin on board.

## 2023-03-17 NOTE — Assessment & Plan Note (Signed)
 Chronic, ongoing.  Followed by hematology and recent notes reviewed.  Continue to collaborate with them.

## 2023-03-17 NOTE — Assessment & Plan Note (Signed)
 Ongoing, noted on past imaging.  Continue statin therapy and recommend cessation of alcohol use.

## 2023-03-17 NOTE — Assessment & Plan Note (Signed)
 Recheck on labs today, taking supplement daily as instructed.  Is on PPI therapy which needs to be maintained at this time.  Recheck level today.

## 2023-03-17 NOTE — Assessment & Plan Note (Signed)
 Recent hospitalization for this.  To follow-up with neurology, recommend he schedule.  Discussed need for reduction and complete cessation of alcohol use.  He denies any current symptoms or further syncopal episodes.

## 2023-03-17 NOTE — Assessment & Plan Note (Signed)
 Chronic, ongoing.  Followed by hematology and recent notes reviewed.  Recommend he restart supplement.

## 2023-03-17 NOTE — Assessment & Plan Note (Signed)
 Chronic, ongoing with A1c 5.4% in January.  At this time continue diet focus and restart Metformin as needed.  Urine ALB 80 (March 2024), can not take ACE or ARB.  Continue Gabapentin for neuropathy pain + recommend he restart B12 and place Voltaren gel on feet at night.  Recommend he check BS 3 days a week and document for provider visits.   - Foot exam up to date - Needs to schedule eye exam - Vaccines up to date - Statin on board.

## 2023-03-17 NOTE — Assessment & Plan Note (Signed)
 Chronic, stable.  BP at goal in office today.  Recommend he monitor BP at least a few mornings a week at home and document.  DASH diet at home.  Continue current medication regimen and adjust as needed, Propranolol only at this time.  Labs today: CMP, CBC.  Urine ALB 80 March 2024, can not take ACE or ARB.

## 2023-03-18 ENCOUNTER — Ambulatory Visit
Admission: RE | Admit: 2023-03-18 | Discharge: 2023-03-18 | Disposition: A | Discharge status: Home / Self Care | Payer: 59 | Attending: Nurse Practitioner | Admitting: Nurse Practitioner

## 2023-03-18 ENCOUNTER — Other Ambulatory Visit: Payer: Self-pay | Admitting: Nurse Practitioner

## 2023-03-18 ENCOUNTER — Ambulatory Visit
Admission: RE | Admit: 2023-03-18 | Discharge: 2023-03-18 | Disposition: A | Discharge status: Home / Self Care | Payer: 59 | Source: Ambulatory Visit | Attending: Nurse Practitioner | Admitting: Nurse Practitioner

## 2023-03-18 ENCOUNTER — Ambulatory Visit: Payer: Self-pay | Admitting: *Deleted

## 2023-03-18 DIAGNOSIS — M25522 Pain in left elbow: Secondary | ICD-10-CM | POA: Insufficient documentation

## 2023-03-18 DIAGNOSIS — D649 Anemia, unspecified: Secondary | ICD-10-CM

## 2023-03-18 LAB — COMPREHENSIVE METABOLIC PANEL
ALT: 21 [IU]/L (ref 0–44)
AST: 64 [IU]/L — ABNORMAL HIGH (ref 0–40)
Albumin: 4 g/dL (ref 3.9–4.9)
Alkaline Phosphatase: 109 [IU]/L (ref 44–121)
BUN/Creatinine Ratio: 16 (ref 10–24)
BUN: 18 mg/dL (ref 8–27)
Bilirubin Total: 0.3 mg/dL (ref 0.0–1.2)
CO2: 24 mmol/L (ref 20–29)
Calcium: 8 mg/dL — ABNORMAL LOW (ref 8.6–10.2)
Chloride: 100 mmol/L (ref 96–106)
Creatinine, Ser: 1.1 mg/dL (ref 0.76–1.27)
Globulin, Total: 2.4 g/dL (ref 1.5–4.5)
Glucose: 102 mg/dL — ABNORMAL HIGH (ref 70–99)
Potassium: 4.1 mmol/L (ref 3.5–5.2)
Sodium: 139 mmol/L (ref 134–144)
Total Protein: 6.4 g/dL (ref 6.0–8.5)
eGFR: 74 mL/min/{1.73_m2} (ref >59)

## 2023-03-18 LAB — MAGNESIUM: Magnesium: 0.9 mg/dL — CL (ref 1.6–2.3)

## 2023-03-18 LAB — CBC WITH DIFFERENTIAL/PLATELET
Basophils Absolute: 0 10*3/uL (ref 0.0–0.2)
Basos: 1 %
EOS (ABSOLUTE): 0 10*3/uL (ref 0.0–0.4)
Eos: 1 %
Hematocrit: 25.5 % — ABNORMAL LOW (ref 37.5–51.0)
Hemoglobin: 8.5 g/dL — ABNORMAL LOW (ref 13.0–17.7)
Immature Grans (Abs): 0.1 10*3/uL (ref 0.0–0.1)
Immature Granulocytes: 2 %
Lymphocytes Absolute: 0.8 10*3/uL (ref 0.7–3.1)
Lymphs: 23 %
MCH: 31.8 pg (ref 26.6–33.0)
MCHC: 33.3 g/dL (ref 31.5–35.7)
MCV: 96 fL (ref 79–97)
Monocytes Absolute: 0.7 10*3/uL (ref 0.1–0.9)
Monocytes: 20 %
NRBC: 2 % — ABNORMAL HIGH (ref 0–0)
Neutrophils Absolute: 1.8 10*3/uL (ref 1.4–7.0)
Neutrophils: 53 %
Platelets: 163 10*3/uL (ref 150–450)
RBC: 2.67 x10E6/uL — CL (ref 4.14–5.80)
RDW: 17 % — ABNORMAL HIGH (ref 11.6–15.4)
WBC: 3.4 10*3/uL (ref 3.4–10.8)

## 2023-03-18 MED ORDER — MAGNESIUM CHLORIDE 64 MG PO TBEC
2.0000 | DELAYED_RELEASE_TABLET | Freq: Two times a day (BID) | ORAL | 0 refills | Status: DC
Start: 2023-03-18 — End: 2023-04-17
  Filled 2023-03-20: qty 360, 90d supply, fill #0
  Filled 2023-04-01 – 2023-04-02 (×2): qty 120, 30d supply, fill #0

## 2023-03-18 NOTE — Progress Notes (Signed)
 Attempted to call patient x 2 today with no answer.  Please attempt call this afternoon and alert patient of following if able: Good morning George Gomez, your labs have returned: - Your CBC continues to show a low hemoglobin and hematocrit, although similar to hospital discharge it is still low.  You do not see hematology until March, I recommend you call and see if can be seen sooner.  Low levels can make you tired, which you mentioned you are. - Magnesium has dropped lower on this check which is concerning.  I have changed your magnesium to a different type and you will take 2 tablets twice a day of the new pill once you pick this up.  However, if you start having tremor or feeling worse then go to ER immediately as may need replacement via IV, goal is to try to get levels up with oral medication though to help prevent you from too much travel in this snow.  I also need you to ensure no alcohol [place] at present or minimal use as this can reduce magnesium levels further and make you very sick. - Kidney and liver function are normal.  I would like you to return on Monday for a lab only visit to recheck magnesium level.  Any questions? Keep being stellar!!  Thank you for allowing me to participate in your care.  I appreciate you. Kindest regards, Barbera Perritt

## 2023-03-18 NOTE — Patient Outreach (Addendum)
  Care Coordination   Follow Up Visit Note   03/18/2023 Name: George Gomez MRN: [id number] DOB: 11-16-1960  George Gomez is a 63 y.o. year old male who sees Aura Dials T, NP for primary care. I spoke with  [name] by phone today.  What matters to the patients health and wellness today?  Assistance with medication. Per patient, he continues to have challenges organizing his medications.  He is not sure what is safe to take and what is not. Patient states that he continues with plan not taking any medication due to feeling confused regarding medication regimen.   Goals Addressed             This Visit's Progress    Assistance with medication organization       Activities and task to complete in order to accomplish goals.   REFERRAL PLACED Referral placed a referral with the pharmacist to assist with managing your medications-appointment scheduled for 03/19/23 Keep all upcoming appointment discussed today Continue with compliance of taking medication prescribed by Doctor           SDOH assessments and interventions completed:  No     Care Coordination Interventions:  Yes, provided  Interventions Today    Flowsheet Row Most Recent Value  Chronic Disease   Chronic disease during today's visit Diabetes, Hypertension (HTN)  General Interventions   General Interventions Discussed/Reviewed General Interventions Reviewed  [pt assessed for communty resource needs related to med adherance and in home care support Pt continues to confirm ability to take care of his own ADL's, family members "check in" hxof cocaine use, current alcohol use confirmed HH to be r/s due to weather]  Doctor Visits Discussed/Reviewed Doctor Visits Reviewed, PCP  [facility] 03/17/23-completed Pharmacy 2/20]  Education Interventions   Education Provided Provided Printed Education  Provided Verbal Education On Medication  [educated on the importance of medicaton aderance]  Mental Health Interventions    Mental Health Discussed/Reviewed Substance Abuse  [continues to drink, contiues to declline resources for subtance misuse]  Pharmacy Interventions   Pharmacy Dicussed/Reviewed Pharmacy Topics Discussed, Medication Adherence  [patient continues to acknowledge challenges with medications-reports taking medications that had been disconitnued-reminded of appt with pharmacy for medication reconciliation on 03/19/23]  Medication Adherence Not taking medication       Follow up plan: Follow up call scheduled for 03/30/23    Encounter Outcome:  Patient Visit Completed

## 2023-03-18 NOTE — Patient Instructions (Addendum)
 Visit Information  Thank you for taking time to visit with me today. Please don't hesitate to contact me if I can be of assistance to you.   Following are the goals we discussed today:   Goals Addressed             This Visit's Progress    Assistance with medication organization       Activities and task to complete in order to accomplish goals.   REFERRAL PLACED Referral placed a referral with the pharmacist to assist with managing your medications-appointment scheduled for 03/19/23 Keep all upcoming appointment discussed today Continue with compliance of taking medication prescribed by Doctor           Our next appointment is by telephone on 03/30/23 at 9am  Please call the care guide team at [phone] if you need to cancel or reschedule your appointment.   If you are experiencing a Mental Health or Behavioral Health Crisis or need someone to talk to, please call 911   Patient verbalizes understanding of instructions and care plan provided today and agrees to view in MyChart. Active MyChart status and patient understanding of how to access instructions and care plan via MyChart confirmed with patient.     Telephone follow up appointment with care management team member scheduled for: 03/30/23  Verna Czech, LCSW [place]  [facility], [facility] Licensed Clinical Social Worker Care Coordinator  Direct Dial: [phone]

## 2023-03-19 ENCOUNTER — Other Ambulatory Visit: Payer: Self-pay

## 2023-03-19 ENCOUNTER — Encounter: Payer: Self-pay | Admitting: Hematology and Oncology

## 2023-03-19 ENCOUNTER — Other Ambulatory Visit (HOSPITAL_COMMUNITY): Payer: Self-pay

## 2023-03-19 MED ORDER — METFORMIN HCL ER 500 MG PO TB24
500.0000 mg | ORAL_TABLET | Freq: Every day | ORAL | 4 refills | Status: DC
  Filled 2023-03-20 – 2023-04-01 (×2): qty 90, 90d supply, fill #0

## 2023-03-19 MED ORDER — MAGNESIUM OXIDE -MG SUPPLEMENT 400 (240 MG) MG PO TABS: 400.0000 mg | ORAL_TABLET | Freq: Two times a day (BID) | ORAL | 4 refills | Status: DC

## 2023-03-19 NOTE — Progress Notes (Signed)
 03/19/2023 Name: George Gomez MRN: [id number] DOB: 1960-12-31  Chief Complaint  Patient presents with   Medication Management   George Gomez is a 63 y.o. year old male who presented for a telephone visit.   They were referred to the pharmacist by their Case Management Team  for assistance in managing complex medication management.   Subjective:  Care Team: Primary Care Provider: Marjie Skiff, NP ; Next Scheduled Visit: 3/31  Medication Access/Adherence  Current Pharmacy:  [id number] - [place], [place] - [address] AT [facility] OF [address] [place] [address] Phone: [phone] Fax: [phone]  -Patient reports affordability concerns with their medications: No  -Patient reports access/transportation concerns to their pharmacy: No  -Patient reports adherence concerns with their medications:  Yes  occasional missed doses due to forgetting to take  Diabetes: Current medications: None-patient previously on metformin, but this was stopped based on current control -A1c on 02/21/2023 was 5.4% -Patient does not monitor home blood glucose  Hypertension: Current medications: Propranolol ER 60 mg daily -Medications previously tried: Patient experienced angioedema with lisinopril and losartan -Patient has a validated, automated, upper arm home BP cuff, but he does not monitor home blood pressure regularly -Last PCP office visit BP was 134/72  Hyperlipidemia/ASCVD Risk Reduction Current lipid lowering medications: Atorvastatin 40 mg daily -Lipid panel on 1/25 reflects slightly elevated total cholesterol and LDL  Objective: Lab Results  Component Value Date   HGBA1C 5.4 02/21/2023   Lab Results  Component Value Date   CREATININE 1.10 03/17/2023   BUN 18 03/17/2023   NA 139 03/17/2023   K 4.1 03/17/2023   CL 100 03/17/2023   CO2 24 03/17/2023   Lab Results  Component Value Date   CHOL 211 (H) 02/21/2023   HDL 114  02/21/2023   LDLCALC 80 02/21/2023   TRIG 84 02/21/2023   CHOLHDL 1.9 02/21/2023   Medications Reviewed Today     Reviewed by George Gomez, RPH (Pharmacist) on 03/19/23 at 1050  Med List Status: <None>   Medication Order Taking? Sig Documenting Provider Last Dose Status Informant  allopurinol (ZYLOPRIM) 300 MG tablet [id number] Yes Take 1 tablet (300 mg total) by mouth daily. George Skiff, NP Taking Active Self, Pharmacy Records  atorvastatin (LIPITOR) 40 MG tablet [id number] Yes Take 1 tablet (40 mg total) by mouth daily. George Skiff, NP Taking Active Self, Pharmacy Records  Blood Glucose Monitoring Suppl [facility] VERIO) w/Device George Gomez No Use to check blood sugar 3 times a day and document results, bring to appointments.  Goal is <130 fasting blood sugar and <180 two hours after meals.  Patient not taking: Reported on 03/04/2023   George Skiff, NP Not Taking Active Self, Pharmacy Records           Med Note (George Gomez   Tue Feb 24, 2023  3:03 PM) Has but does not use   Cholecalciferol 50 MCG (2000 UT) CAPS [id number] Yes Take 1 capsule by mouth daily. [provider] Taking Active Self, Pharmacy Records  cyanocobalamin (VITAMIN B12) 1000 MCG tablet [id number] Yes Take 1 tablet (1,000 mcg total) by mouth daily. George Skiff, NP Taking Active Self, Pharmacy Records  gabapentin (NEURONTIN) 300 MG capsule [id number] Yes Take one tablet (300 MG) by mouth in the morning, then take one tablet (300 MG) by mouth at noon, and then take 600 MG (two tablets) by mouth before bedtime.  George Gomez T, NP Taking Active   glucose blood (ONETOUCH VERIO) test strip [id number] No Use to check blood sugar 3 times a day and document results, bring to appointments.  Goal is <130 fasting blood sugar and <180 two hours after meals.  Patient not taking: Reported on 02/17/2023   George Skiff, NP Not Taking Active Self, Pharmacy Records  Lancets [facility] ULTRASOFT)  lancets [id number] No Use to check blood sugar 3 times a day and document results, bring to appointments.  Goal is <130 fasting blood sugar and <180 two hours after meals.  Patient not taking: Reported on 03/04/2023   George Skiff, NP Not Taking Active Self, Pharmacy Records           Med Note (George Gomez   Tue Feb 24, 2023  3:03 PM) Not currently checking BG   magnesium chloride (SLOW-MAG) 64 MG TBEC SR tablet [id number] Yes Take 2 tablets (128 mg total) by mouth 2 (two) times daily. George Gomez T, NP Taking Active   ondansetron (ZOFRAN) 4 MG tablet [id number] Yes Take 1 tablet (4 mg total) by mouth every 8 (eight) hours as needed. Janith Lima, MD Taking Active Self, Pharmacy Records           Med Note [facility], CRYSTAL Gomez   Tue Feb 24, 2023  3:04 PM) Denies N/V  pantoprazole (PROTONIX) 40 MG tablet [id number] Yes Take 1 tablet (40 mg total) by mouth daily. George Gomez T, NP Taking Active   propranolol ER (INDERAL LA) 60 MG 24 hr capsule [id number] Yes Take 1 capsule (60 mg total) by mouth daily. George Skiff, NP Taking Active Self, Pharmacy Records  tiZANidine (ZANAFLEX) 2 MG tablet [id number] Yes TAKE 1 TABLET(2 MG) BY MOUTH EVERY 8 HOURS AS NEEDED FOR MUSCLE SPASMS Cannady, Dorie Rank, NP Taking Active Self, Pharmacy Records  triamcinolone cream (KENALOG) 0.1 % [id number] Yes Apply 1 application topically 2 (two) times daily. Gerre Scull, NP Taking Active Self, Pharmacy Records  VOLTAREN 1 % GEL [id number] Yes  [provider] Taking Active Self, Pharmacy Records           Assessment/Plan:   Medication Access/Adherence -Fill history reflects good adherence to maintenance medications, including allopurinol, atorvastatin, and propranolol -Discussed pharmacy services such as adherence packaging and home delivery, and patient endorses this would be beneficial to him.  Coordinating with [facility] to transfer prescriptions over from [facility] and  enroll patient in the services.  Preferred pharmacy has been updated on patient profile, home address for delivery has been verified, and part D drug coverage has been verified.  Diabetes: -Currently controlled -Continue regular follow-up with PCP  Hypertension: -Currently moderately controlled -Continue current regimen and regular follow-up with PCP.  If blood pressure remains consistently greater than 130/80, could consider addition of calcium channel blocker such as amlodipine 5 mg daily.  Hyperlipidemia/ASCVD Risk Reduction: - Currently uncontrolled with LDL greater than 70 -Recommend consideration of increasing a atorvastatin to 80 mg daily with follow-up lipid panel and LFTs in 12 weeks  Follow Up Plan: Will monitor progress of adherence packaging and home delivery  George Gomez, PharmD, DPLA

## 2023-03-20 ENCOUNTER — Other Ambulatory Visit: Payer: Self-pay

## 2023-03-20 ENCOUNTER — Telehealth: Payer: Self-pay | Admitting: Nurse Practitioner

## 2023-03-20 NOTE — Patient Outreach (Signed)
 Care Management  Transitions of Care Program Transitions of Care Post-discharge week 4  03/20/2023 Name: George Gomez MRN: [id number] DOB: 1960/06/22  Subjective: George Gomez is a 63 y.o. year old male who is a primary care patient of Cannady, Dorie Rank, NP. The Care Management team was unable to reach the patient by phone to assess and address transitions of care needs.    Patient  Outreach attempt is in the course of  [facility]  30-day TOC program. Pt previously agreed and is enrolled in the  program due to potential risk for readmission and/or high utilization. Unfortunately, I was not able to speak with the patient in regards to recent hospital discharge    Patient's voicemail has  a generic greeting. To maintain HIPAA compliance, left message including only [facility]BCI CM contact information and a request for a call back .    Plan: Additional outreach attempts will be made to reach the patient enrolled in the [facility] Program (Post Inpatient/ED Visit).  Susa Loffler , BSN, RN [facility]   [facility]BCI-Population Health RN Care Manager Direct Dial [phone]  Fax: [phone] Website: [email or web address]

## 2023-03-20 NOTE — Telephone Encounter (Signed)
 Contacted patient and discussed medications with him. Nothing further needed as this time per the patient.

## 2023-03-20 NOTE — Telephone Encounter (Signed)
 Copied from CRM [id number]. Topic: Clinical - Medication Question >> Mar 20, 2023  8:20 AM George Gomez wrote: Reason for CRM: The patient would like to be contacted when possible to review their medications  The patient shares that they are concerned that they have not received a prescription but uncertain as to exactly which medication they're in need of  Please contact when possible

## 2023-03-20 NOTE — Telephone Encounter (Signed)
 This encounter was created in error - please disregard.

## 2023-03-22 ENCOUNTER — Encounter: Payer: Self-pay | Admitting: Nurse Practitioner

## 2023-03-22 NOTE — Progress Notes (Signed)
 Contacted via MyChart   No fractures to elbow area, but there is inflammation.  If pain continues we can consider steroid injection in office which may offer some benefit.

## 2023-03-23 ENCOUNTER — Other Ambulatory Visit: Payer: Self-pay

## 2023-03-23 ENCOUNTER — Other Ambulatory Visit: Payer: 59

## 2023-03-23 ENCOUNTER — Telehealth: Payer: Self-pay

## 2023-03-23 DIAGNOSIS — D649 Anemia, unspecified: Secondary | ICD-10-CM

## 2023-03-23 MED ORDER — MAGNESIUM OXIDE -MG SUPPLEMENT 400 (240 MG) MG PO TABS
400.0000 mg | ORAL_TABLET | Freq: Two times a day (BID) | ORAL | 4 refills | Status: DC
  Filled 2023-04-21 – 2023-05-04 (×3): qty 180, 90d supply, fill #0
  Filled 2023-05-05: qty 60, 30d supply, fill #0

## 2023-03-23 NOTE — Telephone Encounter (Signed)
 FYI to provider.   Copied from CRM [id number]. Topic: Clinical - Home Health Verbal Orders >> Mar 23, 2023  8:21 AM [name] wrote: Caller/Agency: Stephanie/[facility] Callback Number: [phone] Service Requested: Occupational Therapy Frequency: N/A Any new concerns about the patient? Yes, the patient refused occupational therapy.

## 2023-03-23 NOTE — Telephone Encounter (Unsigned)
 Copied from CRM [id number]. Topic: Clinical - Home Health Verbal Orders >> Mar 23, 2023  8:21 AM [name] wrote: Caller/Agency: Stephanie/[facility] Callback Number: [phone] Service Requested: Occupational Therapy Frequency: N/A Any new concerns about the patient? Yes, the patient refused occupational therapy.

## 2023-03-23 NOTE — Telephone Encounter (Signed)
 Noted, he refuses.

## 2023-03-24 ENCOUNTER — Telehealth: Payer: Self-pay

## 2023-03-24 LAB — MAGNESIUM: Magnesium: 1.1 mg/dL — ABNORMAL LOW (ref 1.6–2.3)

## 2023-03-24 LAB — CBC WITH DIFFERENTIAL/PLATELET
Basophils Absolute: 0 10*3/uL (ref 0.0–0.2)
Basos: 1 %
EOS (ABSOLUTE): 0.1 10*3/uL (ref 0.0–0.4)
Eos: 2 %
Hematocrit: 28.1 % — ABNORMAL LOW (ref 37.5–51.0)
Hemoglobin: 9 g/dL — ABNORMAL LOW (ref 13.0–17.7)
Immature Grans (Abs): 0 10*3/uL (ref 0.0–0.1)
Immature Granulocytes: 1 %
Lymphocytes Absolute: 0.8 10*3/uL (ref 0.7–3.1)
Lymphs: 27 %
MCH: 31.4 pg (ref 26.6–33.0)
MCHC: 32 g/dL (ref 31.5–35.7)
MCV: 98 fL — ABNORMAL HIGH (ref 79–97)
Monocytes Absolute: 0.5 10*3/uL (ref 0.1–0.9)
Monocytes: 16 %
NRBC: 1 % — ABNORMAL HIGH (ref 0–0)
Neutrophils Absolute: 1.6 10*3/uL (ref 1.4–7.0)
Neutrophils: 53 %
Platelets: 193 10*3/uL (ref 150–450)
RBC: 2.87 x10E6/uL — ABNORMAL LOW (ref 4.14–5.80)
RDW: 17.5 % — ABNORMAL HIGH (ref 11.6–15.4)
WBC: 3 10*3/uL — ABNORMAL LOW (ref 3.4–10.8)

## 2023-03-24 LAB — FOLATE: Folate: 4 ng/mL (ref >3.0)

## 2023-03-24 LAB — IRON: Iron: 86 ug/dL (ref 38–169)

## 2023-03-24 LAB — FERRITIN: Ferritin: 429 ng/mL — ABNORMAL HIGH (ref 30–400)

## 2023-03-24 LAB — POTASSIUM: Potassium: 4.3 mmol/L (ref 3.5–5.2)

## 2023-03-24 NOTE — Progress Notes (Signed)
 Good day crew, please call George Gomez and let him know labs have returned: - Magnesium level is trending up, please continue magnesium supplement as I ordered for you. - Potassium level normal. - You continue to show anemia, mild improvement this check.  I need you to ensure you have a follow-up scheduled with hematology and the GI provider.  You would benefit having colonoscopy done, which you did miss on February 14th.  Please schedule follow-up visits with both.  This is very important.  Any questions? Keep being amazing!!  Thank you for allowing me to participate in your care.  I appreciate you. Kindest regards, Daundre Biel

## 2023-03-24 NOTE — Patient Instructions (Signed)
 Visit Information  Thank you for taking time to visit with me today. Please don't hesitate to contact me if I can be of assistance to you before your next scheduled telephone appointment.  Your next appointment is by telephone on 3/5 at 2:30pm   Following is a copy of your care plan:   Goals Addressed             This Visit's Progress    COMPLETED: TOC Care Plan       Current Barriers:  Medication management medication adherence  Provider appointments- scheduled follow-up  Home Health services -new request for service- he missed previous PCP F2F visit rescheduled for 2/18   RNCM Clinical Goal(s):  Patient will work with the Care Management team over the next 30 days to address Transition of Care Barriers: Medication Management Support at home Provider appointments-reiterated the need to attend scheduled visits  Home Health services-requested SN PT OT needs F2F visit  take all medications exactly as prescribed and will call provider for medication related questions as evidenced by no missed medication doses, check BG consistently- he is refusing all meds   attend all scheduled medical appointments: PCP and GI , Neurology and Oncology as evidenced by no  missed follow-up visits through collaboration with RN Care manager, provider, and care team.   Interventions: Evaluation of current treatment plan related to  self management and patient's adherence to plan as established by provider  Transitions of Care:  Goal on track:  Yes. Doctor Visits  - discussed the importance of doctor visits next visit 3/4  Contacted provider for patient needs Home Health- He is followed [facility]  Post discharge activity limitations prescribed by provider reviewed Reviewed Signs and symptoms of infection  Diabetes Interventions:  (Status:  Goal on track:  Yes.) Short Term Goal Assessed patient's understanding of A1c goal: <8% Provided education to patient about basic DM disease process Reviewed  medications with patient and discussed importance of medication adherence Reviewed scheduled/upcoming provider appointments including: need for Home Health SN, OT/OT Review of patient status, including review of consultants reports, relevant laboratory and other test results, and medications completed Screening for signs and symptoms of depression related to chronic disease state  Assessed social determinant of health barriers Lab Results  Component Value Date   HGBA1C 5.4 02/21/2023    Patient Goals/Self-Care Activities: Participate in Transition of Care Program/Attend TOC scheduled calls Take all medications as prescribed Attend all scheduled provider appointments Call pharmacy for medication refills 3-7 days in advance of running out of medications Perform all self care activities independently  Perform IADL's (shopping, preparing meals, housekeeping, managing finances) independently Call provider office for new concerns or questions   Follow Up Plan:  Telephone follow up appointment with care management team member scheduled for:  04/01/23 @2  :30pm m Call w/ SW 03/30/23 Call with Pharmacist 03/19/23- completed next 3/31    The patient has been provided with contact information for the care management team and has been advised to call with any health related questions or concerns.          Medication review  Reviewed current home medications -- provided education as needed. Patient is aware of potential side effects and was encouraged to notify PCP for any adverse side effects or unwanted symptoms not relieved with interventions  Patient will call 911 for Medical Emergencies or Life -Threatening Symptoms.  Reviewed goals for care Patient/ Caregiver verbalizes understanding of instructions with the plan of care . The  Patient / Caregiver was encouraged to make informed decisions about care, actively participate in managing health conditions, and implement lifestyle changes as needed to  promote independence and self-management of healthcare. SDOH screenings have been completed and addressed if indicted.  There are no reported barriers to care.    Follow-up Plan [facility]I Case Management Nurse will provide follow-up and on-going assessment ,evaluation and education of disease processes, recommended interventions for both chronic and acute medical conditions ,  along with ongoing review of symptoms ,medication reviews / reconciliation during each weekly call . Any updates , inconsistencies, discrepancies or acute care concerns will be addressed and routed to the correct Practitioner if indicated   [facility]  Please call the care guide team at [phone]  if you need to cancel or reschedule your appointment . For scheduled calls -Three attempts will be made to reach you -if the scheduled call is missed or  we are unable to reach the you after 3 attempts no additional outreach attempts will be made and the TOC follow-up will be closed .   If you need to speak to a Nurse you may  call me directly at the number below or if I am unavailable,and  your need is urgent  please call the main VBCI number at [phone] and ask to speak with one of the [facility] ( Transition of Care )  Nurses  .  Patient was encouraged to Contact PCP with any changes in baseline or  medication regimen,  changes in health status  /  well-being, safety concerns, including falls any questions or concerns regarding ongoing medical care, any difficulty obtaining or picking up prescriptions, any changes or worsening in condition- including  symptoms not relieved  with interventions                                                                            Additionally, If you experience worsening of your symptoms, develop shortness of breath, If you are experiencing a medical emergency,  develop suicidal or homicidal thoughts you must seek medical attention immediately by calling 911 or report to your local  emergency department or urgent care.   If you have a non-emergency medical problem during routine business hours, please contact your provider's office and ask to speak with a nurse.       Please take the time to read instructions/literature along with the possible adverse reactions/side effects for all the Medicines that have been prescribed to you. Only take newly prescribed  Medications after you have completely understood and accept all the possible adverse reactions/side effects.   Do not take more than prescribed Medications for  Pain, Sleep and Anxiety. Do not drive when taking Pain medications or sleep aid/ insomnia  medications It is not advisable to combine anxiety, sleep and pain medications without talking with your primary care practitioner    If you are experiencing a Mental Health or Behavioral Health Crisis or need someone to talk to Please call the Suicide and Crisis Lifeline: 988 You may also call the [place] National Suicide Prevention Lifeline: 903-582-8421 or TTY: 346-491-9355 TTY 7816565455) to talk to a trained counselor.  You may call the Behavioral Health Crisis Line at  979-623-9492, at any time, 24 hours a day, 7 days a week- however If you are in danger or need immediate medical attention, call 911.   If you would like help to quit smoking, call 1-800-QUIT-NOW ( 805-348-2089) OR Espaol: 1-855-Djelo-Ya (3-295-188-4166) o para ms informacin haga clic aqu or Text READY to 063-016 to register via text.   Susa Loffler , BSN, RN [place]   [facility]BCI-Population Health RN Care Manager Direct Dial [phone]  Website: [email or web address]

## 2023-03-24 NOTE — Patient Outreach (Signed)
 Care Management  Transitions of Care Program Transitions of Care Post-discharge Program Completion Transferred to CCM    03/24/2023 Name: George Gomez MRN: [id number] DOB: 1960/07/27  Subjective: George Gomez is a 63 y.o. year old male who is a primary care patient of Cannady, Dorie Rank, NP. The Care Management team Engaged with patient Engaged with patient by telephone to assess and address transitions of care needs.   Consent to Services:  Patient was given information about care management services, agreed to services, and gave verbal consent to participate.   Assessment:     Patient/ Caregiver  voices no new complaints or concerns  and has not developed/ reported any new medical issues / Dx or acute changes. - since last follow-up call for most recent  Hospital stay   1/24-1/26 / 2025  No reported or reviewed Hospital Readmissions / No Urgent Care Visits / Transfers for Acute Change in condition .   No MD/Specialist or Consultant visits reported since last follow-up call next visit 3/5  He reports he needs to reschedule his Colonoscopy with [facility] clinic , offered number but he stated he will check when he can et transport and schedule accordingly    Patient was Alert & O x 4 ,responsive, able to voice needs, cooperative with visit   There were no noted or reported cognitive/ mental status changes during this assessment Patient denies changes in ADL function or IADL;s  at this time.  No reports of new onset or increased / uncontrolled pain, has chronic pain managed with medicinal and non-medicinal interventions.He reported elbow paun recent xray was neg. He is followed by [facility] for therapy and per MD note he may considering injections  if nit relieved with interventions   No changes in skin, no rashes, wounds or open areas  Diet reviewed and  education provided .  Therapy Services   / PT  /  OT  next visit Friday 2/28  Medication reconciliation/ review completed based on most  recent medication list in EHR; and in review of recent Provider follow-up appointments- confirmed patient obtained / is taking all newly prescribed medications as instructed and is aware of any changes to previous medications regimen including dosage adjustments.  Reports  no new changes in medication/treatment  regimen  during this assessment period.  He is not taking Metformin or checking his blood glucose- he feels it was well controlled. He had recent call with VBCI Pharmacist and is working on Research officer, trade union which he feels will,help with adherence  Medications and current treatments are effective for use intended. No adverse Drug reactions  reported No changes or modifications indicated at this time.   Patient self- manages  medications Patient is able to take medications without difficulty;  denies questions/ concerns and reports no barriers to medication adherence at this time   Patient  educated on red flag s/s to watch for based on current discharge diagnosis and was encouraged to report, any changes in baseline or  medication regimen,   or any new unmanaged side effects or symptoms not relieved with interventions  to PCP and / or the  VBCI Case Management team .        SDOH Interventions    Flowsheet Row Care Coordination from 03/10/2023 in [facility] Care Coordination Telephone from 02/24/2023 in [facility] Clinical Support from 02/17/2023 in [facility] Office Visit from 07/24/2022 in [facility] Office Visit from 01/01/2022 in  [facility] Clinical Support from 09/24/2021 in [facility]  SDOH Interventions        Food Insecurity Interventions Intervention Not Indicated Intervention Not Indicated Intervention Not Indicated -- -- Intervention Not Indicated  Housing Interventions Intervention Not Indicated Intervention Not  Indicated Intervention Not Indicated -- -- Intervention Not Indicated  Transportation Interventions Intervention Not Indicated, Patient Resources (Friends/Family) Intervention Not Indicated, Patient Resources (Friends/Family) Intervention Not Indicated -- -- Intervention Not Indicated  Utilities Interventions Intervention Not Indicated Intervention Not Indicated Intervention Not Indicated -- -- --  Alcohol Usage Interventions Alcohol Education/Brief Advice, Patient Declined, Other (Comment) -- Patient Declined -- -- --  Depression Interventions/Treatment  -- -- -- Patient refuses Treatment PHQ2-9 Score <4 Follow-up Not Indicated --  Financial Strain Interventions -- -- Intervention Not Indicated -- -- Intervention Not Indicated  Physical Activity Interventions -- -- Patient Declined -- -- Intervention Not Indicated  Stress Interventions -- -- Intervention Not Indicated -- -- Intervention Not Indicated  Social Connections Interventions -- Intervention Not Indicated Intervention Not Indicated -- -- Intervention Not Indicated  Health Literacy Interventions -- -- Intervention Not Indicated -- -- --        Goals Addressed             This Visit's Progress    COMPLETED: TOC Care Plan       Current Barriers:  Medication management medication adherence  Provider appointments- scheduled follow-up  Home Health services -new request for service- he missed previous PCP F2F visit rescheduled for 2/18   RNCM Clinical Goal(s):  Patient will work with the Care Management team over the next 30 days to address Transition of Care Barriers: Medication Management Support at home Provider appointments-reiterated the need to attend scheduled visits  Home Health services-requested SN PT OT needs F2F visit  take all medications exactly as prescribed and will call provider for medication related questions as evidenced by no missed medication doses, check BG consistently- he is refusing all meds   attend all  scheduled medical appointments: PCP and GI , Neurology and Oncology as evidenced by no  missed follow-up visits through collaboration with RN Care manager, provider, and care team.   Interventions: Evaluation of current treatment plan related to  self management and patient's adherence to plan as established by provider  Transitions of Care:  Goal on track:  Yes. Doctor Visits  - discussed the importance of doctor visits next visit 3/4  Contacted provider for patient needs Home Health- He is followed by [facility]  Post discharge activity limitations prescribed by provider reviewed Reviewed Signs and symptoms of infection  Diabetes Interventions:  (Status:  Goal on track:  Yes.) Short Term Goal Assessed patient's understanding of A1c goal: <8% Provided education to patient about basic DM disease process Reviewed medications with patient and discussed importance of medication adherence Reviewed scheduled/upcoming provider appointments including: need for Home Health SN, OT/OT Review of patient status, including review of consultants reports, relevant laboratory and other test results, and medications completed Screening for signs and symptoms of depression related to chronic disease state  Assessed social determinant of health barriers Lab Results  Component Value Date   HGBA1C 5.4 02/21/2023    Patient Goals/Self-Care Activities: Participate in Transition of Care Program/Attend TOC scheduled calls Take all medications as prescribed Attend all scheduled provider appointments Call pharmacy for medication refills 3-7 days in advance of running out of medications Perform all self care activities independently  Perform IADL's (shopping, preparing meals,  housekeeping, managing finances) independently Call provider office for new concerns or questions   Follow Up Plan:  Telephone follow up appointment with care management team member scheduled for:  04/01/23 @2  :30pm m Call w/ SW 03/30/23 Call  with Pharmacist 03/19/23- completed next 3/31    The patient has been provided with contact information for the care management team and has been advised to call with any health related questions or concerns.          Plan:  Patient stated he was able to take his medications today and per recent pharmacy call and Pharmacist note    [facility]BCI Pharmacist was Coordinating with [facility] to transfer prescriptions over from [facility] and enroll patient in the services.  Preferred pharmacy has been updated on patient profile, home address for delivery has been verified, and part D drug coverage has been verified.    The patient has successfully completed the 30-day TOC Program. Condition is stable No further acute needs identified at this time. He is followed by [facility] He saw therapist today and stated next visit is Friday Chronic conditions and ongoing care is  managed thru collaboration with  PCP,  Specialists and additional Healthcare Providers if indicated . Patient verbalized understanding of ongoing plan of care. . He has been referred for ongoing CCM with VBCI team and visits scheduled SW 3/3 and RN 04/01/23  SDOH needs have been screened and interventions provided if identified.  Reviewed current home medications -- provided education as needed.  Previously discussed rationale of use, how/when to take medications. Patient is aware of potential side effects, and was encouraged to notify PCP for any changes in condition or signs / symptoms not relieved  with interventions.  He  stated his medications were set up by [facility] and the pharmacist is working on compliance packaging  Patient will call 911 for Medical Emergencies or Life -Threatening or report to a local emergency department or urgent care.   Patient was encouraged to Contact PCP  with any questions or concerns regarding ongoing  medical care, any  difficulty obtaining or picking up  prescriptions, any  changes  or  worsening in  condition including signs / symptoms not relieved  with interventions Follow up as indicated with the  Care Team , or sooner should any new problems arise.   Patient had no additional questions or concerns at this time. Current needs addressed.  The patient has been provided with contact information for the care management team and has been advised to call with any health related questions or concerns.   Susa Loffler , BSN, RN [facility]   [facility]BCI-Population Health RN Care Manager Direct Dial [phone]  Fax: [phone] Website: [email or web address]

## 2023-03-27 ENCOUNTER — Other Ambulatory Visit (HOSPITAL_COMMUNITY): Payer: Self-pay

## 2023-03-27 DIAGNOSIS — I152 Hypertension secondary to endocrine disorders: Secondary | ICD-10-CM

## 2023-03-27 DIAGNOSIS — F1010 Alcohol abuse, uncomplicated: Secondary | ICD-10-CM | POA: Diagnosis not present

## 2023-03-27 DIAGNOSIS — R809 Proteinuria, unspecified: Secondary | ICD-10-CM

## 2023-03-27 DIAGNOSIS — E1122 Type 2 diabetes mellitus with diabetic chronic kidney disease: Secondary | ICD-10-CM

## 2023-03-27 DIAGNOSIS — E1159 Type 2 diabetes mellitus with other circulatory complications: Secondary | ICD-10-CM

## 2023-03-27 DIAGNOSIS — E1142 Type 2 diabetes mellitus with diabetic polyneuropathy: Secondary | ICD-10-CM

## 2023-03-27 DIAGNOSIS — N183 Chronic kidney disease, stage 3 unspecified: Secondary | ICD-10-CM

## 2023-03-27 DIAGNOSIS — E1169 Type 2 diabetes mellitus with other specified complication: Secondary | ICD-10-CM | POA: Diagnosis not present

## 2023-03-27 DIAGNOSIS — E785 Hyperlipidemia, unspecified: Secondary | ICD-10-CM | POA: Diagnosis not present

## 2023-03-27 DIAGNOSIS — K760 Fatty (change of) liver, not elsewhere classified: Secondary | ICD-10-CM | POA: Diagnosis not present

## 2023-03-27 DIAGNOSIS — I700 Atherosclerosis of aorta: Secondary | ICD-10-CM

## 2023-03-27 DIAGNOSIS — E1129 Type 2 diabetes mellitus with other diabetic kidney complication: Secondary | ICD-10-CM

## 2023-03-30 ENCOUNTER — Ambulatory Visit: Payer: Self-pay | Admitting: *Deleted

## 2023-03-30 NOTE — Patient Instructions (Signed)
 Visit Information  Thank you for taking time to visit with me today. Please don't hesitate to contact me if I can be of assistance to you.   Following are the goals we discussed today:   Goals Addressed             This Visit's Progress    Assistance with medication organization       Activities and task to complete in order to accomplish goals.  Keep all upcoming appointment discussed today Please bring all medications to appointment with provider on 03/31/23 to obtain further clarification Continue with compliance of taking medication prescribed by Doctor           Our next appointment is by telephone on 04/16/23 at 10am  Please call the care guide team at [phone] if you need to cancel or reschedule your appointment.   If you are experiencing a Mental Health or Behavioral Health Crisis or need someone to talk to, please call 911   Patient verbalizes understanding of instructions and care plan provided today and agrees to view in MyChart. Active MyChart status and patient understanding of how to access instructions and care plan via MyChart confirmed with patient.     Telephone follow up appointment with care management team member scheduled for: 04/16/23  Verna Czech, LCSW [place]  [facility], [facility] Licensed Clinical Social Worker Care Coordinator  Direct Dial: [phone]

## 2023-03-30 NOTE — Patient Outreach (Signed)
 Care Coordination   Follow Up Visit Note   03/30/2023 Name: George Gomez MRN: [id number] DOB: May 09, 1960  George Gomez is a 63 y.o. year old male who sees George Gomez T, NP for primary care. I spoke with  George Gomez by phone today.  What matters to the patients health and wellness today?  Assistance with medication. Per patient,medication adherence has improved, however continues to have some challenges organizing his medications     Goals Addressed             This Visit's Progress    Assistance with medication organization       Activities and task to complete in order to accomplish goals.  Keep all upcoming appointment discussed today Please bring all medications to appointment with provider on 03/31/23 to obtain further clarification Continue with compliance of taking medication prescribed by Doctor           SDOH assessments and interventions completed:  No     Care Coordination Interventions:  Yes, provided  Interventions Today    Flowsheet Row Most Recent Value  Chronic Disease   Chronic disease during today's visit Diabetes, Hypertension (HTN)  General Interventions   General Interventions Discussed/Reviewed General Interventions Reviewed, Doctor Visits  [Patient continues to report assistance needs with medication-confirms being active with George Gomez(George Gomez) PT was able to arrange patient's medications, feels better about medications, confirms adherance but continues to have some confusion regarding regimin]  Doctor Visits Discussed/Reviewed Doctor Visits Discussed  George Gomez - George Gomez 03/31/23 ,George Gomez 3/5, phone call from pharmacist on 2/20, pt agreeable to calling George Gomez to re-schedule colonsocopy that was missed due to be hospitalized]  George Gomez/Specialist Visits Compliance with follow-up visit  Pharmacy Interventions   Pharmacy Dicussed/Reviewed Pharmacy Topics Discussed  [pt confirms that George Gomez came last week -arranged his medications. Per patient, they may also be  assisting with pill packaging-encouraged pt to follow up with provider with questions with medications during appt with George Gomez on 3/4. Initial appt with  George Gomez on 3/5]       Follow up plan: Follow up call scheduled for 04/16/23    Encounter Outcome:  Patient Visit Completed

## 2023-03-31 ENCOUNTER — Ambulatory Visit: Payer: 59 | Admitting: Nurse Practitioner

## 2023-04-01 ENCOUNTER — Other Ambulatory Visit (HOSPITAL_COMMUNITY): Payer: Self-pay

## 2023-04-01 ENCOUNTER — Ambulatory Visit: Payer: Self-pay | Admitting: *Deleted

## 2023-04-01 ENCOUNTER — Other Ambulatory Visit: Payer: Self-pay

## 2023-04-01 ENCOUNTER — Encounter: Payer: Self-pay | Admitting: Hematology and Oncology

## 2023-04-01 NOTE — Patient Outreach (Signed)
 Care Coordination   04/01/2023 Name: Nam Vossler MRN: [id number] DOB: 1960/04/07   Care Coordination Outreach Attempts:  An unsuccessful outreach was attempted for an appointment today.  Follow Up Plan:  Additional outreach attempts will be made to offer the patient complex care management information and services.   Encounter Outcome:  No Answer   Care Coordination Interventions:  No, not indicated    Rodney Langton, RN, MSN, CCM [place]  [facility], [facility] RN Care Coordinator Direct Dial: [phone] / Main [phone] Fax [phone] Email: [email or web address]: [email or web address]

## 2023-04-02 ENCOUNTER — Encounter: Payer: Self-pay | Admitting: Hematology and Oncology

## 2023-04-02 ENCOUNTER — Other Ambulatory Visit: Payer: Self-pay

## 2023-04-06 ENCOUNTER — Other Ambulatory Visit: Payer: Self-pay

## 2023-04-08 ENCOUNTER — Other Ambulatory Visit: Payer: Self-pay

## 2023-04-10 ENCOUNTER — Other Ambulatory Visit: Payer: Self-pay

## 2023-04-10 ENCOUNTER — Ambulatory Visit: Admitting: Nurse Practitioner

## 2023-04-13 ENCOUNTER — Other Ambulatory Visit (HOSPITAL_COMMUNITY): Payer: Self-pay

## 2023-04-13 ENCOUNTER — Ambulatory Visit: Payer: Self-pay | Admitting: Nurse Practitioner

## 2023-04-13 ENCOUNTER — Other Ambulatory Visit: Payer: Self-pay

## 2023-04-13 DIAGNOSIS — D509 Iron deficiency anemia, unspecified: Secondary | ICD-10-CM

## 2023-04-13 DIAGNOSIS — E538 Deficiency of other specified B group vitamins: Secondary | ICD-10-CM

## 2023-04-13 NOTE — Telephone Encounter (Signed)
  Chief Complaint: elbow pain Symptoms: pain Frequency: intermittent Pertinent Negatives: Patient denies redness, swelling, fever, open areas Disposition: [] ED /[] Urgent Care (no appt availability in office) / [x] Appointment(In office/virtual)/ []  [facility]/ [] Home Care/ [] Refused Recommended Disposition /[] [facility]/ []  Follow-up with PCP Additional Notes:  Patient called earlier and scheduled evaluation on 04/21/23, requested call back to discuss elbow symptoms. Spoke with George Gomez who reports ongoing intermittent elbow pain after sustaining a fall 3 months ago. He has already been evaluated with x-ray after the fall.  No redness or swelling, skin is thin on the elbow. Not painful unless he touches the elbow, when pain comes on it radiates to his hand. Afebrile. Denies all other symptoms. Not using OTC pain relievers, using "diabetic pain medicine". Care advice discussed as documented in protocol. Discussed reasons to call back. Plan to keep previously scheduled appointment on 04/21/23.    Message from [name] sent at 04/13/2023  9:24 AM EDT  Copied From CRM [id number]. Reason for Triage: Patient has missed a few appointments with primary care doctor - he is seen by other providers as well. He was suppose to follow up at the beginning of the month on his elbow pain - Contact Center Agent scheduled him for the next appointment with his nurse practioner, Aura Dials on 03/25 - when I asked him about his elbow pain, he mentioned that it comes and goes, the pain is about six but it depends on what he's doing. I asked whether he would like a call back from a nurse regarding his elbow pain and he said '' yes '' - Please call patient to discuss elbow pain.   Reason for Disposition  Elbow pain is a chronic symptom (recurrent or ongoing AND present > 4 weeks)  Protocols used: Elbow Pain-A-AH

## 2023-04-13 NOTE — Telephone Encounter (Signed)
 Voicemail left for patient to call back to office to speak with Nurse Triage  Copied From CRM [phone]. Reason for Triage: Patient has missed a few appointments with primary care doctor - he is seen by other providers as well. He was suppose to follow up at the beginning of the month on his elbow pain - Contact Center Agent scheduled him for the next appointment with his nurse practioner, Aura Dials on 03/25 - when I asked him about his elbow pain, he mentioned that it comes and goes, the pain is about six but it depends on what he's doing. I asked whether he would like a call back from a nurse regarding his elbow pain and he said '' yes '' - Please call patient to discuss elbow pain.

## 2023-04-14 ENCOUNTER — Inpatient Hospital Stay

## 2023-04-14 ENCOUNTER — Encounter: Payer: Self-pay | Admitting: Oncology

## 2023-04-14 ENCOUNTER — Other Ambulatory Visit: Payer: Self-pay

## 2023-04-14 ENCOUNTER — Inpatient Hospital Stay (HOSPITAL_BASED_OUTPATIENT_CLINIC_OR_DEPARTMENT_OTHER): Payer: 59 | Admitting: Oncology

## 2023-04-14 ENCOUNTER — Inpatient Hospital Stay: Payer: 59 | Attending: Oncology

## 2023-04-14 VITALS — BP 141/78 | HR 65 | Temp 98.6°F | Resp 18 | Ht 70.0 in | Wt 203.9 lb

## 2023-04-14 DIAGNOSIS — D509 Iron deficiency anemia, unspecified: Secondary | ICD-10-CM | POA: Insufficient documentation

## 2023-04-14 DIAGNOSIS — D61818 Other pancytopenia: Secondary | ICD-10-CM | POA: Diagnosis not present

## 2023-04-14 DIAGNOSIS — D649 Anemia, unspecified: Secondary | ICD-10-CM

## 2023-04-14 DIAGNOSIS — N189 Chronic kidney disease, unspecified: Secondary | ICD-10-CM | POA: Insufficient documentation

## 2023-04-14 DIAGNOSIS — E538 Deficiency of other specified B group vitamins: Secondary | ICD-10-CM | POA: Diagnosis not present

## 2023-04-14 DIAGNOSIS — Z79899 Other long term (current) drug therapy: Secondary | ICD-10-CM | POA: Insufficient documentation

## 2023-04-14 DIAGNOSIS — R7401 Elevation of levels of liver transaminase levels: Secondary | ICD-10-CM | POA: Insufficient documentation

## 2023-04-14 LAB — CMP (CANCER CENTER ONLY)
ALT: 30 U/L (ref 0–44)
AST: 63 U/L — ABNORMAL HIGH (ref 15–41)
Albumin: 3.6 g/dL (ref 3.5–5.0)
Alkaline Phosphatase: 106 U/L (ref 38–126)
Anion gap: 11 (ref 5–15)
BUN: 15 mg/dL (ref 8–23)
CO2: 24 mmol/L (ref 22–32)
Calcium: 8.8 mg/dL — ABNORMAL LOW (ref 8.9–10.3)
Chloride: 101 mmol/L (ref 98–111)
Creatinine: 0.9 mg/dL (ref 0.61–1.24)
GFR, Estimated: >60 mL/min (ref >60)
Glucose, Bld: 121 mg/dL — ABNORMAL HIGH (ref 70–99)
Potassium: 3.5 mmol/L (ref 3.5–5.1)
Sodium: 136 mmol/L (ref 135–145)
Total Bilirubin: 0.5 mg/dL (ref 0.0–1.2)
Total Protein: 7.3 g/dL (ref 6.5–8.1)

## 2023-04-14 LAB — CBC
HCT: 28.4 % — ABNORMAL LOW (ref 39.0–52.0)
Hemoglobin: 8.9 g/dL — ABNORMAL LOW (ref 13.0–17.0)
MCH: 31.7 pg (ref 26.0–34.0)
MCHC: 31.3 g/dL (ref 30.0–36.0)
MCV: 101.1 fL — ABNORMAL HIGH (ref 80.0–100.0)
Platelets: 188 10*3/uL (ref 150–400)
RBC: 2.81 MIL/uL — ABNORMAL LOW (ref 4.22–5.81)
RDW: 18.1 % — ABNORMAL HIGH (ref 11.5–15.5)
WBC: 3.7 10*3/uL — ABNORMAL LOW (ref 4.0–10.5)
nRBC: 0.8 % — ABNORMAL HIGH (ref 0.0–0.2)

## 2023-04-14 LAB — RETICULOCYTES
Immature Retic Fract: 23.7 % — ABNORMAL HIGH (ref 2.3–15.9)
RBC.: 2.95 MIL/uL — ABNORMAL LOW (ref 4.22–5.81)
Retic Count, Absolute: 102.4 10*3/uL (ref 19.0–186.0)
Retic Ct Pct: 3.5 % — ABNORMAL HIGH (ref 0.4–3.1)

## 2023-04-14 LAB — VITAMIN B12: Vitamin B-12: 414 pg/mL (ref 180–914)

## 2023-04-14 LAB — IRON AND TIBC
Iron: 139 ug/dL (ref 45–182)
Saturation Ratios: 51 % — ABNORMAL HIGH (ref 17.9–39.5)
TIBC: 270 ug/dL (ref 250–450)
UIBC: 131 ug/dL

## 2023-04-14 LAB — TSH: TSH: 2.199 u[IU]/mL (ref 0.350–4.500)

## 2023-04-14 LAB — LACTATE DEHYDROGENASE: LDH: 185 U/L (ref 98–192)

## 2023-04-14 LAB — FERRITIN: Ferritin: 335 ng/mL (ref 24–336)

## 2023-04-15 ENCOUNTER — Other Ambulatory Visit: Payer: Self-pay

## 2023-04-15 LAB — KAPPA/LAMBDA LIGHT CHAINS
Kappa free light chain: 35.1 mg/L — ABNORMAL HIGH (ref 3.3–19.4)
Kappa, lambda light chain ratio: 1.12 (ref 0.26–1.65)
Lambda free light chains: 31.3 mg/L — ABNORMAL HIGH (ref 5.7–26.3)

## 2023-04-15 LAB — HAPTOGLOBIN: Haptoglobin: 227 mg/dL (ref 32–363)

## 2023-04-15 NOTE — Telephone Encounter (Unsigned)
 Copied from CRM [id number]. Topic: Clinical - Medication Question >> Apr 15, 2023  1:32 PM Gildardo Pounds wrote: Reason for CRM: Dawn with [facility] needs fax of medication list to assist with medication management. [phone]; fax [phone]

## 2023-04-15 NOTE — Telephone Encounter (Signed)
Medication list printed and faxed as requested.

## 2023-04-16 ENCOUNTER — Ambulatory Visit: Payer: Self-pay | Admitting: *Deleted

## 2023-04-16 LAB — MULTIPLE MYELOMA PANEL, SERUM
Albumin SerPl Elph-Mcnc: 3.6 g/dL (ref 2.9–4.4)
Albumin/Glob SerPl: 1.1 (ref 0.7–1.7)
Alpha 1: 0.3 g/dL (ref 0.0–0.4)
Alpha2 Glob SerPl Elph-Mcnc: 0.9 g/dL (ref 0.4–1.0)
B-Globulin SerPl Elph-Mcnc: 1 g/dL (ref 0.7–1.3)
Gamma Glob SerPl Elph-Mcnc: 1.2 g/dL (ref 0.4–1.8)
Globulin, Total: 3.4 g/dL (ref 2.2–3.9)
IgA: 253 mg/dL (ref 61–437)
IgG (Immunoglobin G), Serum: 1317 mg/dL (ref 603–1613)
IgM (Immunoglobulin M), Srm: 99 mg/dL (ref 20–172)
Total Protein ELP: 7 g/dL (ref 6.0–8.5)

## 2023-04-16 NOTE — Patient Outreach (Addendum)
 Care Coordination   Follow Up Visit Note   04/16/2023 Name: Raymir Frommelt MRN: [id number] DOB: 08/09/1960  Usbaldo Pannone is a 63 y.o. year old male who sees Aura Dials T, NP for primary care. I spoke with  [name] by phone today.  What matters to the patients health and wellness today?  Patient  denies having any additional community resource needs. States main concern is organizing his medications and  ensuring that he is taking them as indicated.  Pharmacist and RNCM to continue to follow.   Goals Addressed             This Visit's Progress    Assistance with medication organization       Activities and task to complete in order to accomplish goals.  Keep all upcoming appointment discussed today Please bring all medications to appointment with provider on 04/21/23 to obtain further clarification Continue adherance with compliance packaging  Expect a call from [facility] for additional follow up            SDOH assessments and interventions completed:  No     Care Coordination Interventions:  Yes, provided  Interventions Today    Flowsheet Row Most Recent Value  Chronic Disease   Chronic disease during today's visit Hypertension (HTN)  General Interventions   General Interventions Discussed/Reviewed General Interventions Reviewed, Referral to Nurse, Doctor Visits, Communication with  [F/U re: med adherance-confirms that he is taking medication as prescribed "Best I felt in a while, I have my energy back"confirms having no addiitonal community resource needs but would like additonal clarification re: medications and pill packaging]  Doctor Visits Discussed/Reviewed Doctor Visits Reviewed, PCP  [facility] 3/25 AWV 1/27 encouraged patient to bring all questions and concerns regarding his medications to provider office visit on 3/25]  Communication with RN  [discussed medication concerns-compliance packaging-pt currently active with pharmacist RN agrees to contact patient to  follow up on compliance packaging]  Nutrition Interventions   Nutrition Discussed/Reviewed Nutrition Discussed  [appetite has increased-doing well]  Pharmacy Interventions   Pharmacy Dicussed/Reviewed Pharmacy Topics Discussed  [patient confirms that he has received his medication in the compliance packs has medications left over in additional bottles-HH RN reviewed his medications and recommended he put all extra medications aside to avoid confusion-RNCM to follow up]       Follow up plan: No further intervention required.   Encounter Outcome:  Patient Visit Completed

## 2023-04-16 NOTE — Patient Instructions (Signed)
 Visit Information  Thank you for taking time to visit with me today. Please don't hesitate to contact me if I can be of assistance to you.   Following are the goals we discussed today:   Goals Addressed             This Visit's Progress    Assistance with medication organization       Activities and task to complete in order to accomplish goals.  Keep all upcoming appointment discussed today Please bring all medications to appointment with provider on 04/21/23 to obtain further clarification Continue adherance with compliance packaging  Expect a call from [facility] for additional follow up           If you are experiencing a Mental Health or Behavioral Health Crisis or need someone to talk to, please call 911   Patient verbalizes understanding of instructions and care plan provided today and agrees to view in MyChart. Active MyChart status and patient understanding of how to access instructions and care plan via MyChart confirmed with patient.     No further follow up required: patient to follow up with [facility] and pharmacist    Verna Czech, LCSW [place]  [facility], [facility] Licensed Clinical Social Worker Care Coordinator  Direct Dial: [phone]

## 2023-04-19 NOTE — Progress Notes (Signed)
   04/19/2023  Patient ID: George Gomez, male   DOB: [birth year], 63 y.o.   MRN: [id number]  S/O Patient outreach to follow-up on medication access/adherence  Medication Access/Adherence -Patient set up to receive adherence packaging and hole delivery of medications from [facility] -Endorses receiving delivery this week but not all medications were included -Recent pill pack included:  allopurinol, atorvastatin, B12, propranolol  A/P  Medication Access/Adherence -Gabapentin 90 day supply filled by [facility] 2/19; so this will be included in April delivery -Mag Ox 400mg  and pantoprazole refills appear to still be with [facility] -Magnesium will be due for refill, but pantoprazole last filled 2/18 for 90 day supply; so pantoprazole will be in April delivery -Verified patient does have Magnesium, gabapentin, and pantoprazole on hand and is taking these medication out of bottles -Advised him to dispose of any other medication bottles to not duplicate any medications included in adherence packaging -Following up with [facility] to request Mag Ox and pantoprazole be transferred from [facility] and verify by April all active medications will be in his adherence pack deliveries  Lenna Gilford, PharmD, DPLA

## 2023-04-19 NOTE — Patient Instructions (Signed)
Pitcher's Elbow  Pitcher's elbow is a type of elbow injury that develops slowly over time (overuse injury). This condition is also called valgus extension overload syndrome (VEOS). This injury is common in athletes who make repeated overhead throwing motions, such as a baseball pitcher throwing a ball. Throwing motions can: Overstretch the strong band of tissue (ligament) on the inside of the elbow (ulnar collateral ligament, or UCL). UCL injuries can range from minor inflammation to a complete ligament tear. Push the bones at the outside and back of the elbow together (compression). These forces can injure the ligament over time and create an abnormal extra bone in the elbow (osteophyte or bone spur). What are the causes? This condition may be caused by: Repeated overhead throwing, such as baseball pitching. Improper throwing motion. Tightness in the shoulder that puts added demand on the elbow. What increases the risk? This condition is more likely to develop in athletes who play sports that involve repeated forceful straightening of the elbow, such as: Baseball or softball. Tennis and other racquet sports. Football. Lacrosse. Gymnastics. [place]. What are the signs or symptoms? Symptoms of this condition include: Elbow pain. Increased pain in your elbow as you straighten your arm forcefully, such as when throwing. Swelling. Limited range of motion or "locking" of the elbow. A feeling like a pop or a tear in your elbow. How is this diagnosed? This condition is diagnosed based on: Your symptoms and medical history. A physical exam. Your health care provider may: Check your elbow's strength, stability, and range of motion. Compare your injured elbow to your other elbow. Gently press your arm and elbow to find the source of pain. Imaging tests, such as: X-rays or CT scans to check for stress fractures and bone spurs. Ultrasound or MRI to check for tears in the ligaments or  tendons. How is this treated? Treatment for this condition may include: Stopping activities that require overhead arm motions, then returning slowly to full activities. Making changes to your sports technique to decrease elbow strain. This may include wearing a brace. Taking medicine for pain. Injecting medicines (corticosteroids) into your elbow to reduce swelling and pain. Doing exercises to improve movement and strength (physical therapy). Surgery. This may be needed if all other treatments do not work. It may involve: Repairing damaged ligaments. Removing abnormal bone growths. Removing pieces of bone or cartilage. After surgery, you will have to wear a brace for several weeks and do physical therapy. Follow these instructions at home: If you have a removable brace: Wear the brace as told by your provider. Remove it only as told by your provider. Check the skin around the brace every day. Tell your provider about any concerns. Loosen the brace if your fingers tingle, become numb, or turn cold and blue. Keep the brace clean. If the brace is not waterproof: Do not let it get wet. Cover it with a watertight covering when you take a bath or shower. Managing pain, stiffness, and swelling  If told, put ice on the injured area. If you have a removable brace, remove it as told by your provider. Put ice in a plastic bag. Place a towel between your skin and the bag. Leave the ice on for 20 minutes, 2-3 times a day. If your skin turns bright red, remove the ice right away to prevent skin damage. The risk of damage is higher if you cannot feel pain, heat, or cold. Move your fingers often to reduce stiffness and swelling. Raise (elevate) the injured  area above the level of your heart while you are sitting or lying down. Activity Rest your elbow and avoid activities that require overhead arm motions as told by your provider. Return to your normal activities as told by your provider. Ask your  provider what activities are safe for you. Do exercises as told by your provider. General instructions Do not use any products that contain nicotine or tobacco. These products include cigarettes, chewing tobacco, and vaping devices, such as e-cigarettes. These can delay healing. If you need help quitting, ask your provider. Take over-the-counter and prescription medicines only as told by your provider. Keep all follow-up visits. Your provider will monitor your injury and activity. How is this prevented? Warm up and stretch before being active. Cool down and stretch after being active. Give your body time to rest between periods of activity. Exercise to improve your physical fitness, including: Strength. Flexibility. Have your throwing motion checked to make sure you use proper form. Rest your elbow if you show signs of tiredness (fatigue). When you start any new sports activity, increase how much you do slowly. Follow the rules for your sport on how often and how much you can throw in a game. Avoid overhead throwing motions as told by your provider. Contact a health care provider if: Your pain does not improve or it gets worse after 2-4 weeks of rest. Get help right away if: Your pain is severe. You cannot move your arm or elbow. This information is not intended to replace advice given to you by your health care provider. Make sure you discuss any questions you have with your health care provider. Document Revised: 09/25/2021 Document Reviewed: 09/25/2021 Elsevier Patient Education  2024 [employer].

## 2023-04-20 ENCOUNTER — Other Ambulatory Visit: Payer: Self-pay

## 2023-04-21 ENCOUNTER — Encounter: Payer: Self-pay | Admitting: Hematology and Oncology

## 2023-04-21 ENCOUNTER — Other Ambulatory Visit (HOSPITAL_COMMUNITY): Payer: Self-pay

## 2023-04-21 ENCOUNTER — Telehealth: Payer: Self-pay | Admitting: *Deleted

## 2023-04-21 ENCOUNTER — Other Ambulatory Visit: Payer: Self-pay

## 2023-04-21 ENCOUNTER — Encounter: Payer: Self-pay | Admitting: Nurse Practitioner

## 2023-04-21 ENCOUNTER — Ambulatory Visit: Admitting: Nurse Practitioner

## 2023-04-21 VITALS — BP 138/66 | HR 83 | Temp 98.0°F | Ht 70.0 in | Wt 202.0 lb

## 2023-04-21 DIAGNOSIS — E1129 Type 2 diabetes mellitus with other diabetic kidney complication: Secondary | ICD-10-CM | POA: Diagnosis not present

## 2023-04-21 DIAGNOSIS — M25522 Pain in left elbow: Secondary | ICD-10-CM | POA: Diagnosis not present

## 2023-04-21 DIAGNOSIS — R809 Proteinuria, unspecified: Secondary | ICD-10-CM

## 2023-04-21 NOTE — Assessment & Plan Note (Signed)
 Ongoing for 3 months after fall on ice.  Recent imaging reassuring.  Suspect ongoing olecranon bursitis.  Offered steroid injection today, he wanted to hold off on this.  Would like to see ortho, placed referral to them.  Recommend placing ice over area a few times daily.  Continue heat pad as needed.  Use Icy/Hot or Voltaren gel.  Recommend arm/elbow compression sleeve.

## 2023-04-21 NOTE — Progress Notes (Signed)
 BP 138/66   Pulse 83   Temp 98 F (36.7 C) (Oral)   Ht 5\' 10"  (1.778 m)   Wt 202 lb (91.6 kg)   SpO2 98%   BMI 28.98 kg/m    Subjective:    Patient ID: George Gomez, male    DOB: Aug 27, 1960, 63 y.o.   MRN: [id number]  HPI: Antione Obar is a 63 y.o. male  Chief Complaint  Patient presents with   Elbow Pain    Patient states he is still having pains in his L elbow. States it is better than it was.    ELBOW PAIN Follow-up today for elbow pain to left side.  Still present but some better than last visit on 03/17/23. This started after a fall on ice.  Recent imaging to forearm and elbow with no fractures.  Possible olecranon bursitis noted. Duration: weeks Location: left posterior Mechanism of injury: unknown Onset: sudden Severity: 4/10  Quality:  sharp, aching, and shooting Frequency: intermittent Radiation: no Aggravating factors: movement or touching Alleviating factors:  heating pad and rest  Status: fluctuating Treatments attempted: rest and heat  Relief with NSAIDs?:  No NSAIDs Taken Swelling: sometimes Redness: no  Warmth: no Trauma: no Chest pain: no  Shortness of breath: no  Fever: no Decreased sensation: no Paresthesias: no Weakness: no   Relevant past medical, surgical, family and social history reviewed and updated as indicated. Interim medical history since our last visit reviewed. Allergies and medications reviewed and updated.  Review of Systems  Constitutional:  Negative for activity change, diaphoresis, fatigue and fever.  Respiratory:  Negative for cough, chest tightness, shortness of breath and wheezing.   Cardiovascular:  Negative for chest pain, palpitations and leg swelling.  Musculoskeletal:  Positive for arthralgias.  Neurological: Negative.   Psychiatric/Behavioral: Negative.      Per HPI unless specifically indicated above     Objective:    BP 138/66   Pulse 83   Temp 98 F (36.7 C) (Oral)   Ht 5\' 10"  (1.778 m)   Wt 202  lb (91.6 kg)   SpO2 98%   BMI 28.98 kg/m   Wt Readings from Last 3 Encounters:  04/21/23 202 lb (91.6 kg)  04/14/23 203 lb 14.4 oz (92.5 kg)  03/17/23 198 lb 6.4 oz (90 kg)    Physical Exam Vitals and nursing note reviewed.  Constitutional:      General: He is awake. He is not in acute distress.    Appearance: He is well-developed and well-groomed. He is not ill-appearing or toxic-appearing.  HENT:     Head: Normocephalic.     Right Ear: Hearing and external ear normal.     Left Ear: Hearing and external ear normal.  Eyes:     General: Lids are normal.     Extraocular Movements: Extraocular movements intact.     Conjunctiva/sclera: Conjunctivae normal.  Neck:     Thyroid: No thyromegaly.     Vascular: No carotid bruit.  Cardiovascular:     Rate and Rhythm: Normal rate and regular rhythm.     Heart sounds: Normal heart sounds. No murmur heard.    No gallop.  Pulmonary:     Effort: No accessory muscle usage or respiratory distress.     Breath sounds: Normal breath sounds.  Abdominal:     General: Bowel sounds are normal. There is no distension.     Palpations: Abdomen is soft.     Tenderness: There is no abdominal tenderness.  Musculoskeletal:  Right elbow: Normal.     Left elbow: No swelling or lacerations. Normal range of motion. Tenderness present in olecranon process.     Right forearm: Normal.     Left forearm: Normal.     Cervical back: Full passive range of motion without pain.     Right lower leg: No edema.     Left lower leg: No edema.  Lymphadenopathy:     Cervical: No cervical adenopathy.  Skin:    General: Skin is warm.     Capillary Refill: Capillary refill takes less than 2 seconds.  Neurological:     Mental Status: He is alert and oriented to person, place, and time.     Deep Tendon Reflexes: Reflexes are normal and symmetric.     Reflex Scores:      Brachioradialis reflexes are 2+ on the right side and 2+ on the left side.      Patellar reflexes  are 2+ on the right side and 2+ on the left side. Psychiatric:        Attention and Perception: Attention normal.        Mood and Affect: Mood normal.        Speech: Speech normal.        Behavior: Behavior normal. Behavior is cooperative.        Thought Content: Thought content normal.     Results for orders placed or performed in visit on 04/14/23  TSH   Collection Time: 04/14/23 12:06 PM  Result Value Ref Range   TSH 2.199 0.350 - 4.500 uIU/mL  Vitamin B12   Collection Time: 04/14/23 12:06 PM  Result Value Ref Range   Vitamin B-12 414 180 - 914 pg/mL  Reticulocytes   Collection Time: 04/14/23 12:06 PM  Result Value Ref Range   Retic Ct Pct 3.5 (H) 0.4 - 3.1 %   RBC. 2.95 (L) 4.22 - 5.81 MIL/uL   Retic Count, Absolute 102.4 19.0 - 186.0 K/uL   Immature Retic Fract 23.7 (H) 2.3 - 15.9 %  Haptoglobin   Collection Time: 04/14/23 12:06 PM  Result Value Ref Range   Haptoglobin 227 32 - 363 mg/dL  Lactate dehydrogenase   Collection Time: 04/14/23 12:06 PM  Result Value Ref Range   LDH 185 98 - 192 U/L  Kappa/lambda light chains   Collection Time: 04/14/23 12:06 PM  Result Value Ref Range   Kappa free light chain 35.1 (H) 3.3 - 19.4 mg/L   Lambda free light chains 31.3 (H) 5.7 - 26.3 mg/L   Kappa, lambda light chain ratio 1.12 0.26 - 1.65  Multiple Myeloma Panel (SPEP&IFE w/QIG)   Collection Time: 04/14/23 12:06 PM  Result Value Ref Range   IgG (Immunoglobin G), Serum 1,317 603 - 1,613 mg/dL   IgA 782 61 - 956 mg/dL   IgM (Immunoglobulin M), Srm 99 20 - 172 mg/dL   Total Protein ELP 7.0 6.0 - 8.5 g/dL   Albumin SerPl Elph-Mcnc 3.6 2.9 - 4.4 g/dL   Alpha 1 0.3 0.0 - 0.4 g/dL   Alpha2 Glob SerPl Elph-Mcnc 0.9 0.4 - 1.0 g/dL   B-Globulin SerPl Elph-Mcnc 1.0 0.7 - 1.3 g/dL   Gamma Glob SerPl Elph-Mcnc 1.2 0.4 - 1.8 g/dL   M Protein SerPl Elph-Mcnc Not Observed Not Observed g/dL   Globulin, Total 3.4 2.2 - 3.9 g/dL   Albumin/Glob SerPl 1.1 0.7 - 1.7   IFE 1 Comment     Please Note Comment  Assessment & Plan:   Problem List Items Addressed This Visit       Endocrine   Type 2 diabetes mellitus with proteinuria (HCC) - Primary   Relevant Orders   Ambulatory referral to Ophthalmology     Other   Elbow pain, left   Ongoing for 3 months after fall on ice.  Recent imaging reassuring.  Suspect ongoing olecranon bursitis.  Offered steroid injection today, he wanted to hold off on this.  Would like to see ortho, placed referral to them.  Recommend placing ice over area a few times daily.  Continue heat pad as needed.  Use Icy/Hot or Voltaren gel.  Recommend arm/elbow compression sleeve.      Relevant Orders   Ambulatory referral to Orthopedic Surgery     Follow up plan: Return in about 2 months (around 07/06/2023) for T2DM, HTN/HLD, CKD.

## 2023-04-21 NOTE — Telephone Encounter (Signed)
 The triage is not working right but I saw the number of the guy. He says that someone has been trying to talk to him and know he is calling us back. I see that the notes says about worse with anemia and you rec: for Bone marrow bx, he does not want it but if he has to do , you need to speak to him. Patient knows that the team will call tomorrow

## 2023-04-21 NOTE — Progress Notes (Signed)
 Hematology/Oncology Consult note [facility]  [phone] [phone]  Patient Care Team: Marjie Skiff, NP as PCP - General (Nurse Practitioner) Toney Reil, MD as Consulting Physician (Gastroenterology) Creig Hines, MD as Consulting Physician (Hematology and Oncology) Jaynie Collins, DO as Consulting Physician (Gastroenterology) Wenda Overland, [place] as [facility] Management Rodney Langton, RN as [facility] Care Management   Name of the patient: George Gomez  [id number]  1960/12/07   Date of visit: 04/21/23  Diagnosis- normocytic anemia likely secondary to chronic disease and component of iron deficiency   Chief complaint/ Reason for visit-routine follow-up of anemia  Heme/Onc history: Patient is a 63 year old African-American male who is transferring his care from Dr. Merlene Pulling to me.He has been followed for his pancytopenia but mainly anemia.  Hemoglobin has remained around 10 for the last 3 years.  He also has mild intermittent leukopenia with a white count that fluctuates between 3.3-4 mainly neutropenia.  Platelet counts have been mostly normal except for intermittent mild low platelet counts between 120s to 140s.  He has prior history of B12 deficiency and he gets monthly B12 injections.  History of folate deficiency but of late that has been normal.  He had a flow cytometry in December 2020 that was normal.  Anemia work-up including TSH copper zinc ANA hepatitis testing and HIV testing was negative.Patient has had an elevated ferritin which has been again a longstanding issue and his ferritin levels fluctuate between [phone].  He had HFE gene testing done in the past which was negative.LFTs have shown mildly elevated AST and ALT intermittently but no hyperbilirubinemia.MR eneterogram in October 2021 showed normal liver size and configuration.  No splenomegaly.   Interval history-he denies any complaints at this time.He  was admitted to the hospital in January 2025 for strokelike symptoms which resolved.  ECOG PS- 1 Pain scale- 0   Review of systems- Review of Systems  Constitutional:  Positive for malaise/fatigue. Negative for chills, fever and weight loss.  HENT:  Negative for congestion, ear discharge and nosebleeds.   Eyes:  Negative for blurred vision.  Respiratory:  Negative for cough, hemoptysis, sputum production, shortness of breath and wheezing.   Cardiovascular:  Negative for chest pain, palpitations, orthopnea and claudication.  Gastrointestinal:  Negative for abdominal pain, blood in stool, constipation, diarrhea, heartburn, melena, nausea and vomiting.  Genitourinary:  Negative for dysuria, flank pain, frequency, hematuria and urgency.  Musculoskeletal:  Negative for back pain, joint pain and myalgias.  Skin:  Negative for rash.  Neurological:  Negative for dizziness, tingling, focal weakness, seizures, weakness and headaches.  Endo/Heme/Allergies:  Does not bruise/bleed easily.  Psychiatric/Behavioral:  Negative for depression and suicidal ideas. The patient does not have insomnia.       Allergies  Allergen Reactions   Lisinopril Swelling   Losartan Swelling     Past Medical History:  Diagnosis Date   Acute on chronic renal failure (HCC) 03/20/2016   Acute renal failure (ARF) (HCC) 08/28/2016   Acute renal failure (HCC) 10/12/2018   Anemia    B12 deficiency 12/11/2017   Clostridium difficile diarrhea 03/18/2016   Diabetes mellitus without complication (HCC)    Diverticulitis    Pt states diverticulitis   EtOH dependence (HCC)    Folate deficiency 07/25/2018   GI bleed 05/30/2017   Hypercholesteremia    Hypertension    Melena 10/21/2018   Weight loss 09/15/2018     Past Surgical History:  Procedure Laterality Date  COLONOSCOPY WITH PROPOFOL N/A 10/16/2018   Procedure: COLONOSCOPY WITH PROPOFOL;  Surgeon: Toney Reil, MD;  Location: [facility] ENDOSCOPY;  Service:  Gastroenterology;  Laterality: N/A;   ENTEROSCOPY N/A 10/16/2018   Procedure: ENTEROSCOPY;  Surgeon: Toney Reil, MD;  Location: [facility] ENDOSCOPY;  Service: Gastroenterology;  Laterality: N/A;   ESOPHAGOGASTRODUODENOSCOPY N/A 07/02/2018   Procedure: ESOPHAGOGASTRODUODENOSCOPY (EGD);  Surgeon: Toney Reil, MD;  Location: [facility] ENDOSCOPY;  Service: Gastroenterology;  Laterality: N/A;   ESOPHAGOGASTRODUODENOSCOPY (EGD) WITH PROPOFOL N/A 06/01/2017   Procedure: ESOPHAGOGASTRODUODENOSCOPY (EGD) WITH PROPOFOL;  Surgeon: Wyline Mood, MD;  Location: [facility] ENDOSCOPY;  Service: Gastroenterology;  Laterality: N/A;   GIVENS CAPSULE STUDY N/A 10/17/2018   Procedure: GIVENS CAPSULE STUDY;  Surgeon: Toney Reil, MD;  Location: [facility] ENDOSCOPY;  Service: Gastroenterology;  Laterality: N/A;   GIVENS CAPSULE STUDY N/A 08/02/2019   Procedure: GIVENS CAPSULE STUDY;  Surgeon: Toney Reil, MD;  Location: [facility] ENDOSCOPY;  Service: Gastroenterology;  Laterality: N/A;   none      Social History   Socioeconomic History   Marital status: Divorced    Spouse name: Not on file   Number of children: 0   Years of education: Not on file   Highest education level: Not on file  Occupational History   Occupation: on disability  Tobacco Use   Smoking status: Never   Smokeless tobacco: Never  Vaping Use   Vaping status: Never Used  Substance and Sexual Activity   Alcohol use: Not Currently    Alcohol/week: 3.0 standard drinks of alcohol    Types: 3 Shots of liquor per week    Comment: 3 shots of liquor daily   Drug use: No   Sexual activity: Yes  Other Topics Concern   Not on file  Social History Narrative   Not on file   Social Drivers of Health   Financial Resource Strain: Low Risk  (02/17/2023)   Overall Financial Resource Strain (CARDIA)    Difficulty of Paying Living Expenses: Not hard at all  Food Insecurity: No Food Insecurity (03/10/2023)   Hunger Vital Sign    Worried About  Running Out of Food in the Last Year: Never true    Ran Out of Food in the Last Year: Never true  Transportation Needs: No Transportation Needs (03/10/2023)   PRAPARE - Administrator, Civil Service (Medical): No    Lack of Transportation (Non-Medical): No  Physical Activity: Inactive (02/17/2023)   Exercise Vital Sign    Days of Exercise per Week: 0 days    Minutes of Exercise per Session: 0 min  Stress: No Stress Concern Present (02/17/2023)   [employer] of Occupational Health - Occupational Stress Questionnaire    Feeling of Stress : Not at all  Social Connections: Socially Isolated (02/24/2023)   Social Connection and Isolation Panel [NHANES]    Frequency of Communication with Friends and Family: More than three times a week    Frequency of Social Gatherings with Friends and Family: More than three times a week    Attends Religious Services: Never    Database administrator or Organizations: No    Attends Banker Meetings: Never    Marital Status: Divorced  Catering manager Violence: Not At Risk (02/24/2023)   Humiliation, Afraid, Rape, and Kick questionnaire    Fear of Current or Ex-Partner: No    Emotionally Abused: No    Physically Abused: No    Sexually Abused: No    Family  History  Problem Relation Age of Onset   Rheum arthritis Mother    Diabetes Father    Heart disease Father    Cancer Father    COPD Sister    Rheum arthritis Sister    Diabetes Maternal Grandmother    Cancer Maternal Grandfather    Diabetes Paternal Grandmother    Cancer Paternal Grandfather      Current Outpatient Medications:    allopurinol (ZYLOPRIM) 300 MG tablet, Take 1 tablet (300 mg total) by mouth daily., Disp: 90 tablet, Rfl: 4   atorvastatin (LIPITOR) 40 MG tablet, Take 1 tablet (40 mg total) by mouth daily., Disp: 90 tablet, Rfl: 4   Blood Glucose Monitoring Suppl (ONETOUCH VERIO) w/Device KIT, Use to check blood sugar 3 times a day and document results,  bring to appointments.  Goal is <130 fasting blood sugar and <180 two hours after meals., Disp: 1 kit, Rfl: 0   Cholecalciferol 50 MCG (2000 UT) CAPS, Take 1 capsule by mouth daily., Disp: , Rfl:    cyanocobalamin (VITAMIN B12) 1000 MCG tablet, Take 1 tablet (1,000 mcg total) by mouth daily., Disp: 90 tablet, Rfl: 4   gabapentin (NEURONTIN) 300 MG capsule, Take 1 capsule (300 mg total) by mouth every morning AND 1 capsule (300 mg total) daily at 12 noon AND 2 capsules (600 mg total) at bedtime., Disp: 360 capsule, Rfl: 4   glucose blood (ONETOUCH VERIO) test strip, Use to check blood sugar 3 times a day and document results, bring to appointments.  Goal is <130 fasting blood sugar and <180 two hours after meals., Disp: 100 each, Rfl: 12   Lancets (ONETOUCH ULTRASOFT) lancets, Use to check blood sugar 3 times a day and document results, bring to appointments.  Goal is <130 fasting blood sugar and <180 two hours after meals., Disp: 100 each, Rfl: 12   magnesium oxide (MAG-OX) 400 (240 Mg) MG tablet, Take 1 tablet (400 mg total) by mouth 2 (two) times daily., Disp: 180 tablet, Rfl: 4   ondansetron (ZOFRAN) 4 MG tablet, Take 1 tablet (4 mg total) by mouth every 8 (eight) hours as needed., Disp: 15 tablet, Rfl: 0   pantoprazole (PROTONIX) 40 MG tablet, Take 1 tablet (40 mg total) by mouth daily., Disp: 90 tablet, Rfl: 4   propranolol ER (INDERAL LA) 60 MG 24 hr capsule, Take 1 capsule (60 mg total) by mouth daily., Disp: 90 capsule, Rfl: 4   tiZANidine (ZANAFLEX) 2 MG tablet, TAKE 1 TABLET(2 MG) BY MOUTH EVERY 8 HOURS AS NEEDED FOR MUSCLE SPASMS, Disp: 30 tablet, Rfl: 0   triamcinolone cream (KENALOG) 0.1 %, Apply 1 application topically 2 (two) times daily., Disp: 453.6 g, Rfl: 0   VOLTAREN 1 % GEL, , Disp: , Rfl:   Physical exam:  Vitals:   04/14/23 1135  BP: (!) 141/78  Pulse: 65  Resp: 18  Temp: 98.6 F (37 C)  TempSrc: Tympanic  SpO2: 100%  Weight: 203 lb 14.4 oz (92.5 kg)  Height: 5\' 10"   (1.778 m)   Physical Exam Cardiovascular:     Rate and Rhythm: Normal rate and regular rhythm.     Heart sounds: Normal heart sounds.  Pulmonary:     Effort: Pulmonary effort is normal.     Breath sounds: Normal breath sounds.  Skin:    General: Skin is warm and dry.  Neurological:     Mental Status: He is alert and oriented to person, place, and time.  Latest Ref Rng & Units 04/14/2023   11:13 AM  CMP  Glucose 70 - 99 mg/dL 161   BUN 8 - 23 mg/dL 15   Creatinine 0.96 - 1.24 mg/dL 0.45   Sodium 409 - 811 mmol/L 136   Potassium 3.5 - 5.1 mmol/L 3.5   Chloride 98 - 111 mmol/L 101   CO2 22 - 32 mmol/L 24   Calcium 8.9 - 10.3 mg/dL 8.8   Total Protein 6.5 - 8.1 g/dL 7.3   Total Bilirubin 0.0 - 1.2 mg/dL 0.5   Alkaline Phos 38 - 126 U/L 106   AST 15 - 41 U/L 63   ALT 0 - 44 U/L 30       Latest Ref Rng & Units 04/14/2023   11:13 AM  CBC  WBC 4.0 - 10.5 K/uL 3.7   Hemoglobin 13.0 - 17.0 g/dL 8.9   Hematocrit 91.4 - 52.0 % 28.4   Platelets 150 - 400 K/uL 188     No images are attached to the encounter.  No results found.   Assessment and plan- Patient is a 63 y.o. male here for routine follow-up of normocytic anemia  Patient's baseline hemoglobin typically runs around 10 over the last 4 years.  Presently it has drifted down to 8.5-9 range since January 2025.  B12 is normal at 414.  Thyroid functions normal.  LDH and haptoglobin normal and therefore no evidence of hemolysis.  Ferritin levels normal at 335 with normal iron studies.  CMP does not show any convincing evidence of chronic kidney disease.  Total protein is normal.  Myeloma panel shows no evidence of M protein and serum free light chain shows elevation of both kappa and lambda light chain but the ratio is normal.  Based on his peripheral blood work there is no obvious cause of worsening anemia.  He does report daily alcohol intake And given the worsening macrocytosis there may be an element of alcohol induced  bone marrow suppression as well.  There have been some persistent nucleated red blood cells noted in his peripheral smear.  I therefore recommend a bone marrow biopsy at this time.  Some of this blood test came back after the patient had left the clinic.  I will tentatively see him back after bone marrow biopsy results are back in about 3 to 4 weeks time   Visit Diagnosis 1. Normocytic anemia      Dr. Owens Shark, MD, MPH [facility] [phone] 04/21/2023 2:30 PM

## 2023-04-22 NOTE — Telephone Encounter (Signed)
 I will call him on Thursday. I have him on my list to call

## 2023-04-23 ENCOUNTER — Inpatient Hospital Stay: Admitting: Oncology

## 2023-04-27 ENCOUNTER — Ambulatory Visit: Payer: Self-pay | Admitting: Nurse Practitioner

## 2023-05-04 ENCOUNTER — Other Ambulatory Visit: Payer: Self-pay

## 2023-05-04 ENCOUNTER — Telehealth: Payer: Self-pay

## 2023-05-04 DIAGNOSIS — D649 Anemia, unspecified: Secondary | ICD-10-CM

## 2023-05-04 NOTE — Telephone Encounter (Signed)
 Noted.

## 2023-05-04 NOTE — Telephone Encounter (Unsigned)
 Copied from CRM [id number]. Topic: General - Other >> May 01, 2023  3:59 PM Patsy Lager T wrote: Reason for CRM: Tobi Bastos from [facility] [phone] stated they has only seen patient 2x since 2/18. Patient will confirm his appts but not at home when the nurse goes out and will not answer his phone nor return calls. Tobi Bastos is requesting to discharge the patient from services. Please call Tobi Bastos for a verbal discharge.

## 2023-05-04 NOTE — Telephone Encounter (Signed)
 Per Marylu Lund the next available appointment for a bone marrow biopsy is Mon 4/21 at 8:30a an arrive at 7:30a. Outbound call to patient; patient agreed to scheduled appointment time.

## 2023-05-05 ENCOUNTER — Encounter: Payer: Self-pay | Admitting: Hematology and Oncology

## 2023-05-05 ENCOUNTER — Other Ambulatory Visit (HOSPITAL_COMMUNITY): Payer: Self-pay

## 2023-05-05 ENCOUNTER — Other Ambulatory Visit: Payer: Self-pay

## 2023-05-05 DIAGNOSIS — D649 Anemia, unspecified: Secondary | ICD-10-CM

## 2023-05-08 ENCOUNTER — Other Ambulatory Visit: Payer: Self-pay

## 2023-05-14 ENCOUNTER — Other Ambulatory Visit: Payer: Self-pay

## 2023-05-14 NOTE — Patient Instructions (Signed)
 Visit Information Thank you for allowing the Care Management team to participate in your care. It was great speaking with you today!  Reminders: Please remember to complete your scheduled Bone Marrow Biopsy on May 18, 2023.  We will follow up on Jun 24, 2023 at 0930. Please do not hesitate to contact me if you require assistance prior to our next outreach.    Roxie Cord [facility] Health Population Health RN Care Manager Direct Dial: [phone]  Fax: [phone] Website: [email or web address]

## 2023-05-14 NOTE — Patient Outreach (Signed)
 Complex Care Management   Visit Note  05/14/2023  Name:  George Gomez MRN: [id number] DOB: [birth year]  Situation: Referral received for Complex Care Management related to  HTN, HLD and DM  I obtained verbal consent from Patient.  Visit completed with George Gomez via telephone.    Background:   Past Medical History:  Diagnosis Date   Acute on chronic renal failure (HCC) 03/20/2016   Acute renal failure (ARF) (HCC) 08/28/2016   Acute renal failure (HCC) 10/12/2018   Anemia    B12 deficiency 12/11/2017   Clostridium difficile diarrhea 03/18/2016   Diabetes mellitus without complication (HCC)    Diverticulitis    Pt states diverticulitis   EtOH dependence (HCC)    Folate deficiency 07/25/2018   GI bleed 05/30/2017   Hypercholesteremia    Hypertension    Melena 10/21/2018   Weight loss 09/15/2018    Assessment: Patient Reported Symptoms:  Cognitive Cognitive Status: Alert and oriented to person, place, and time, Normal speech and language skills Cognitive/Intellectual Conditions Management [RPT]: None reported or documented in medical history or problem list   Health Maintenance Behaviors: Sleep adequate Healing Pattern: Average Health Facilitated by: Rest  Neurological   Neurological Conditions:  (History of CVA) Neurological Management Strategies: Medication therapy, Routine screening Neurological Self-Management Outcome: 3 (uncertain)  HEENT HEENT Symptoms Reported: No symptoms reported HEENT Comment: No Symptoms Reported    Cardiovascular Cardiovascular Symptoms Reported: No symptoms reported Does patient have uncontrolled Hypertension?: No Cardiovascular Conditions: Hypertension, High blood cholesterol, Coronary artery disease Cardiovascular Management Strategies: Diet modification, Medication therapy, Routine screening Cardiovascular Self-Management Outcome: 4 (good) Cardiovascular Comment: Reports managing well  Respiratory Respiratory Symptoms Reported: No symptoms  reported (Runny Nose and Eyes Burning due to pollen) Respiratory Comment: No Symptoms Reported  Endocrine Patient reports the following symptoms related to hypoglycemia or hyperglycemia : No symptoms reported Is patient diabetic?: Yes Is patient checking blood sugars at home?: No (Reports no recent readings) Endocrine Conditions: Diabetes Endocrine Management Strategies: Diet modification, Routine screening Endocrine Self-Management Outcome: 4 (good)  Gastrointestinal Gastrointestinal Symptoms Reported: No symptoms reported Gastrointestinal Conditions: Reflux/heartburn (Hepatic Steatosis) Gastrointestinal Management Strategies: Diet modification, Medication therapy Gastrointestinal Self-Management Outcome: 3 (uncertain) Gastrointestinal Comment: Reports daily alcohol consumption Nutrition Risk Screen (CP): No indicators present  Genitourinary   Genitourinary Comment: No Symptoms Reported  Integumentary Integumentary Symptoms Reported: No symptoms reported Skin Conditions: Eczema Skin Management Strategies: Medication therapy, Routine screening Skin Self-Management Outcome: 4 (good)  Musculoskeletal Musculoskelatal Symptoms Reviewed: No symptoms reported Musculoskeletal Conditions: Other (Reports history of right hip pain) Musculoskeletal Management Strategies: Routine screening, Medical device Musculoskeletal Self-Management Outcome: 4 (good) Falls in the past year?: Yes Number of falls in past year: 1 or less Was there an injury with Fall?: No Fall Risk Category Calculator: 1 Patient Fall Risk Level: Low Fall Risk Patient at Risk for Falls Due to: Medication side effect, History of fall(s) (Reports accidental fall after slipping in the snow) Fall risk Follow up: Falls prevention discussed  Psychosocial Psychosocial Symptoms Reported: No symptoms reported Behavioral Health Conditions:  (History of  Alcohol Abuse) Behavioral Health Self-Management Outcome: 3 (uncertain) Major  Change/Loss/Stressor/Fears (CP): Denies Quality of Family Relationships: supportive Do you feel physically threatened by others?: No      05/14/2023   12:31 PM  Depression screen PHQ 2/9  Decreased Interest 0  Down, Depressed, Hopeless 0  PHQ - 2 Score 0    Vitals:    Medications Reviewed Today     Reviewed by Juanell Fairly, RN (  Registered Nurse) on 05/14/23 at 0924  Med List Status: <None>   Medication Order Taking? Sig Documenting Provider Last Dose Status Informant  allopurinol (ZYLOPRIM) 300 MG tablet [id number] Yes Take 1 tablet (300 mg total) by mouth daily. Marjie Skiff, NP Taking Active Self, Pharmacy Records  atorvastatin (LIPITOR) 40 MG tablet [id number] Yes Take 1 tablet (40 mg total) by mouth daily. Marjie Skiff, NP Taking Active Self, Pharmacy Records  Blood Glucose Monitoring Suppl [facility] VERIO) w/Device Andria Rhein [id number]  Use to check blood sugar 3 times a day and document results, bring to appointments.  Goal is <130 fasting blood sugar and <180 two hours after meals. Marjie Skiff, NP  Active Self, Pharmacy Records           Med Note (LLOYD, CRYSTAL L   Tue Feb 24, 2023  3:03 PM) Has but does not use   Cholecalciferol 50 MCG (2000 UT) CAPS [id number] Yes Take 1 capsule by mouth daily. [provider] Taking Active Self, Pharmacy Records  cyanocobalamin (VITAMIN B12) 1000 MCG tablet [id number] Yes Take 1 tablet (1,000 mcg total) by mouth daily. Marjie Skiff, NP Taking Active Self, Pharmacy Records  gabapentin (NEURONTIN) 300 MG capsule [id number] Yes Take 1 capsule (300 mg total) by mouth every morning AND 1 capsule (300 mg total) daily at 12 noon AND 2 capsules (600 mg total) at bedtime. Aura Dials T, NP Taking Active   glucose blood (ONETOUCH VERIO) test strip [id number]  Use to check blood sugar 3 times a day and document results, bring to appointments.  Goal is <130 fasting blood sugar and <180 two hours after meals. Marjie Skiff, NP   Active Self, Pharmacy Records  Lancets [facility] ULTRASOFT) lancets [id number]  Use to check blood sugar 3 times a day and document results, bring to appointments.  Goal is <130 fasting blood sugar and <180 two hours after meals. Marjie Skiff, NP  Active Self, Pharmacy Records           Med Note (LLOYD, CRYSTAL L   Tue Feb 24, 2023  3:03 PM) Not currently checking BG   magnesium oxide (MAG-OX) 400 (240 Mg) MG tablet [id number] Yes Take 1 tablet (400 mg total) by mouth 2 (two) times daily. Aura Dials T, NP Taking Active   ondansetron (ZOFRAN) 4 MG tablet [id number] Yes Take 1 tablet (4 mg total) by mouth every 8 (eight) hours as needed. Janith Lima, MD Taking Active Self, Pharmacy Records           Med Note [facility], CRYSTAL L   Tue Feb 24, 2023  3:04 PM) Denies N/V  pantoprazole (PROTONIX) 40 MG tablet [id number] Yes Take 1 tablet (40 mg total) by mouth daily. Aura Dials T, NP Taking Active   propranolol ER (INDERAL LA) 60 MG 24 hr capsule [id number] Yes Take 1 capsule (60 mg total) by mouth daily. Marjie Skiff, NP Taking Active Self, Pharmacy Records  tiZANidine (ZANAFLEX) 2 MG tablet [id number] Yes TAKE 1 TABLET(2 MG) BY MOUTH EVERY 8 HOURS AS NEEDED FOR MUSCLE SPASMS Cannady, Dorie Rank, NP Taking Active Self, Pharmacy Records  triamcinolone cream (KENALOG) 0.1 % [id number] Yes Apply 1 application topically 2 (two) times daily. Gerre Scull, NP Taking Active Self, Pharmacy Records  VOLTAREN 1 % GEL [id number] Yes  [provider] Taking Active Self, Pharmacy Records            Recommendation:   Complete Bone Biopsy as scheduled on May 18, 2023 Follow up with PCP as scheduled  Follow Up Plan:   Telephone follow up appointment on Jun 24, 2023 at 0930.   Roxie Cord [facility] Health Population Health RN Care Manager Direct Dial: [phone]  Fax: [phone] Website: [email or web address]

## 2023-05-14 NOTE — Progress Notes (Signed)
 Patient for IR Bone Marrow Biopsy on Monday 05/18/23, I called and spoke with the patient on the phone and gave pre-procedure instructions. Pt was made aware to be here at 7:30a, NPO after MN prior to procedure as well as driver post procedure/recovery/discharge. Pt stated understanding.  Called 05/14/23

## 2023-05-15 ENCOUNTER — Other Ambulatory Visit: Payer: Self-pay | Admitting: Physician Assistant

## 2023-05-15 DIAGNOSIS — Z01818 Encounter for other preprocedural examination: Secondary | ICD-10-CM

## 2023-05-18 ENCOUNTER — Ambulatory Visit
Admission: RE | Admit: 2023-05-18 | Discharge: 2023-05-18 | Disposition: A | Discharge status: Home / Self Care | Source: Ambulatory Visit | Attending: Oncology | Admitting: Oncology

## 2023-05-18 ENCOUNTER — Other Ambulatory Visit: Payer: Self-pay

## 2023-05-18 ENCOUNTER — Encounter: Payer: Self-pay | Admitting: Radiology

## 2023-05-18 DIAGNOSIS — Z1379 Encounter for other screening for genetic and chromosomal anomalies: Secondary | ICD-10-CM | POA: Diagnosis not present

## 2023-05-18 DIAGNOSIS — E1122 Type 2 diabetes mellitus with diabetic chronic kidney disease: Secondary | ICD-10-CM | POA: Diagnosis not present

## 2023-05-18 DIAGNOSIS — N189 Chronic kidney disease, unspecified: Secondary | ICD-10-CM | POA: Insufficient documentation

## 2023-05-18 DIAGNOSIS — D649 Anemia, unspecified: Secondary | ICD-10-CM | POA: Insufficient documentation

## 2023-05-18 DIAGNOSIS — Z01818 Encounter for other preprocedural examination: Secondary | ICD-10-CM

## 2023-05-18 DIAGNOSIS — I129 Hypertensive chronic kidney disease with stage 1 through stage 4 chronic kidney disease, or unspecified chronic kidney disease: Secondary | ICD-10-CM | POA: Insufficient documentation

## 2023-05-18 HISTORY — PX: IR BONE MARROW BIOPSY & ASPIRATION: IMG5727

## 2023-05-18 LAB — CBC WITH DIFFERENTIAL/PLATELET
Abs Immature Granulocytes: 0.04 10*3/uL (ref 0.00–0.07)
Basophils Absolute: 0 10*3/uL (ref 0.0–0.1)
Basophils Relative: 1 %
Eosinophils Absolute: 0 10*3/uL (ref 0.0–0.5)
Eosinophils Relative: 1 %
HCT: 27.7 % — ABNORMAL LOW (ref 39.0–52.0)
Hemoglobin: 9.1 g/dL — ABNORMAL LOW (ref 13.0–17.0)
Immature Granulocytes: 1 %
Lymphocytes Relative: 18 %
Lymphs Abs: 0.7 10*3/uL (ref 0.7–4.0)
MCH: 31.2 pg (ref 26.0–34.0)
MCHC: 32.9 g/dL (ref 30.0–36.0)
MCV: 94.9 fL (ref 80.0–100.0)
Monocytes Absolute: 0.6 10*3/uL (ref 0.1–1.0)
Monocytes Relative: 14 %
Neutro Abs: 2.6 10*3/uL (ref 1.7–7.7)
Neutrophils Relative %: 65 %
Platelets: 162 10*3/uL (ref 150–400)
RBC: 2.92 MIL/uL — ABNORMAL LOW (ref 4.22–5.81)
RDW: 17.5 % — ABNORMAL HIGH (ref 11.5–15.5)
WBC: 4 10*3/uL (ref 4.0–10.5)
nRBC: 0.8 % — ABNORMAL HIGH (ref 0.0–0.2)

## 2023-05-18 LAB — GLUCOSE, CAPILLARY: Glucose-Capillary: 132 mg/dL — ABNORMAL HIGH (ref 70–99)

## 2023-05-18 MED ORDER — SODIUM CHLORIDE 0.9 % IV SOLN: INTRAVENOUS | Status: DC

## 2023-05-18 MED ORDER — MIDAZOLAM HCL 2 MG/2ML IJ SOLN
INTRAMUSCULAR | Status: AC
  Filled 2023-05-18: qty 2

## 2023-05-18 MED ORDER — LIDOCAINE 1 % OPTIME INJ - NO CHARGE
10.0000 mL | Freq: Once | INTRAMUSCULAR | Status: AC
  Administered 2023-05-18: 10 mL via INTRADERMAL
  Filled 2023-05-18: qty 10

## 2023-05-18 MED ORDER — FENTANYL CITRATE (PF) 100 MCG/2ML IJ SOLN
INTRAMUSCULAR | Status: AC | PRN
  Administered 2023-05-18 (×2): 25 ug via INTRAVENOUS
  Administered 2023-05-18: 50 ug via INTRAVENOUS

## 2023-05-18 MED ORDER — HEPARIN SOD (PORK) LOCK FLUSH 100 UNIT/ML IV SOLN
INTRAVENOUS | Status: AC
  Filled 2023-05-18: qty 5

## 2023-05-18 MED ORDER — FENTANYL CITRATE (PF) 100 MCG/2ML IJ SOLN
INTRAMUSCULAR | Status: AC
  Filled 2023-05-18: qty 2

## 2023-05-18 MED ORDER — MIDAZOLAM HCL 2 MG/2ML IJ SOLN
INTRAMUSCULAR | Status: AC | PRN
  Administered 2023-05-18: 1 mg via INTRAVENOUS
  Administered 2023-05-18 (×2): .5 mg via INTRAVENOUS

## 2023-05-18 NOTE — H&P (Signed)
 Chief Complaint: Anemia; Request for image guided bone marrow biopsy and aspiration  Referring Provider(s): Rao,Archana C   Supervising Physician: Myrlene Asper  Patient Status: [facility] - Out-pt  History of Present Illness: George Gomez is a 63 y.o. male with a past medical history significant for DM II, EtOH dependency,  HTN, HLD, and anemia. He has been persistently anemic over the past 4 years with his baseline around 10 but drifting into 8.5 to 9 range in 2025. Dr. Randy Buttery with heme/onc has investigated this but so far has not identified etiology. A bone marrow biopsy and aspiration is now requested as part of ongoing work up with Dr. Randy Buttery.   He is lying in bed with mother at bedside at time of exam and interview. Denies fever, chills, CP, SOB, rash, N/V, blood in stool or urine, bruising. Endorses fatigue and generalized weakness.   He is not on a blood thinner. Has been NPO since MN. Room air baseline. Has been prescribed a CPAP in the past but has not used in 2 years.     Patient is Full Code  Past Medical History:  Diagnosis Date   Acute on chronic renal failure (HCC) 03/20/2016   Acute renal failure (ARF) (HCC) 08/28/2016   Acute renal failure (HCC) 10/12/2018   Anemia    B12 deficiency 12/11/2017   Clostridium difficile diarrhea 03/18/2016   Diabetes mellitus without complication (HCC)    Diverticulitis    Pt states diverticulitis   EtOH dependence (HCC)    Folate deficiency 07/25/2018   GI bleed 05/30/2017   Hypercholesteremia    Hypertension    Melena 10/21/2018   Weight loss 09/15/2018    Past Surgical History:  Procedure Laterality Date   COLONOSCOPY WITH PROPOFOL  N/A 10/16/2018   Procedure: COLONOSCOPY WITH PROPOFOL ;  Surgeon: Selena Daily, MD;  Location: [facility] ENDOSCOPY;  Service: Gastroenterology;  Laterality: N/A;   ENTEROSCOPY N/A 10/16/2018   Procedure: ENTEROSCOPY;  Surgeon: Selena Daily, MD;  Location: [facility] ENDOSCOPY;  Service: Gastroenterology;   Laterality: N/A;   ESOPHAGOGASTRODUODENOSCOPY N/A 07/02/2018   Procedure: ESOPHAGOGASTRODUODENOSCOPY (EGD);  Surgeon: Selena Daily, MD;  Location: [facility] ENDOSCOPY;  Service: Gastroenterology;  Laterality: N/A;   ESOPHAGOGASTRODUODENOSCOPY (EGD) WITH PROPOFOL  N/A 06/01/2017   Procedure: ESOPHAGOGASTRODUODENOSCOPY (EGD) WITH PROPOFOL ;  Surgeon: Luke Salaam, MD;  Location: [facility] ENDOSCOPY;  Service: Gastroenterology;  Laterality: N/A;   GIVENS CAPSULE STUDY N/A 10/17/2018   Procedure: GIVENS CAPSULE STUDY;  Surgeon: Selena Daily, MD;  Location: [facility] ENDOSCOPY;  Service: Gastroenterology;  Laterality: N/A;   GIVENS CAPSULE STUDY N/A 08/02/2019   Procedure: GIVENS CAPSULE STUDY;  Surgeon: Selena Daily, MD;  Location: [facility] ENDOSCOPY;  Service: Gastroenterology;  Laterality: N/A;   none      Allergies: Lisinopril  and Losartan   Medications: Prior to Admission medications   Medication Sig Start Date End Date Taking? Authorizing Provider  allopurinol  (ZYLOPRIM ) 300 MG tablet Take 1 tablet (300 mg total) by mouth daily. 01/23/23   Cannady, Jolene T, NP  atorvastatin  (LIPITOR) 40 MG tablet Take 1 tablet (40 mg total) by mouth daily. 01/23/23   Cannady, Jolene T, NP  Blood Glucose Monitoring Suppl (ONETOUCH VERIO) w/Device KIT Use to check blood sugar 3 times a day and document results, bring to appointments.  Goal is <130 fasting blood sugar and <180 two hours after meals. 07/24/22   Cannady, Jolene T, NP  Cholecalciferol 50 MCG (2000 UT) CAPS Take 1 capsule by mouth daily.    [provider]  cyanocobalamin  (VITAMIN B12) 1000 MCG tablet Take 1 tablet (1,000 mcg total) by mouth daily. 01/23/23   Cannady, Jolene T, NP  gabapentin  (NEURONTIN ) 300 MG capsule Take 1 capsule (300 mg total) by mouth every morning AND 1 capsule (300 mg total) daily at 12 noon AND 2 capsules (600 mg total) at bedtime. 03/17/23   Cannady, Jolene T, NP  glucose blood (ONETOUCH VERIO) test strip Use to check  blood sugar 3 times a day and document results, bring to appointments.  Goal is <130 fasting blood sugar and <180 two hours after meals. 07/24/22   Cannady, Jolene T, NP  Lancets (ONETOUCH ULTRASOFT) lancets Use to check blood sugar 3 times a day and document results, bring to appointments.  Goal is <130 fasting blood sugar and <180 two hours after meals. 07/24/22   Cannady, Jolene T, NP  magnesium  oxide (MAG-OX) 400 (240 Mg) MG tablet Take 1 tablet (400 mg total) by mouth 2 (two) times daily. 03/23/23   Cannady, Jolene T, NP  ondansetron  (ZOFRAN ) 4 MG tablet Take 1 tablet (4 mg total) by mouth every 8 (eight) hours as needed. 12/09/22 12/09/23  Kandee Orion, MD  pantoprazole  (PROTONIX ) 40 MG tablet Take 1 tablet (40 mg total) by mouth daily. 03/17/23   Cannady, Jolene T, NP  propranolol  ER (INDERAL  LA) 60 MG 24 hr capsule Take 1 capsule (60 mg total) by mouth daily. 01/23/23   Cannady, Jolene T, NP  tiZANidine  (ZANAFLEX ) 2 MG tablet TAKE 1 TABLET(2 MG) BY MOUTH EVERY 8 HOURS AS NEEDED FOR MUSCLE SPASMS 06/25/22   Cannady, Jolene T, NP  triamcinolone  cream (KENALOG ) 0.1 % Apply 1 application topically 2 (two) times daily. 01/14/21   McElwee, Adolfo Hooker, NP  VOLTAREN 1 % GEL  03/04/20   [provider]     Family History  Problem Relation Age of Onset   Rheum arthritis Mother    Diabetes Father    Heart disease Father    Cancer Father    COPD Sister    Rheum arthritis Sister    Diabetes Maternal Grandmother    Cancer Maternal Grandfather    Diabetes Paternal Grandmother    Cancer Paternal Grandfather     Social History   Socioeconomic History   Marital status: Divorced    Spouse name: Not on file   Number of children: 0   Years of education: Not on file   Highest education level: Not on file  Occupational History   Occupation: on disability  Tobacco Use   Smoking status: Never   Smokeless tobacco: Never  Vaping Use   Vaping status: Never Used  Substance and Sexual Activity    Alcohol  use: Yes    Alcohol /week: 21.0 standard drinks of alcohol     Types: 21 Shots of liquor per week    Comment: 3 shots of liquor daily   Drug use: No   Sexual activity: Yes  Other Topics Concern   Not on file  Social History Narrative   Not on file   Social Drivers of Health   Financial Resource Strain: Low Risk  (05/14/2023)   Overall Financial Resource Strain (CARDIA)    Difficulty of Paying Living Expenses: Not very hard  Food Insecurity: No Food Insecurity (05/14/2023)   Hunger Vital Sign    Worried About Running Out of Food in the Last Year: Never true    Ran Out of Food in the Last Year: Never true  Transportation Needs: No Transportation Needs (  05/14/2023)   PRAPARE - Administrator, Civil Service (Medical): No    Lack of Transportation (Non-Medical): No  Physical Activity: Inactive (05/14/2023)   Exercise Vital Sign    Days of Exercise per Week: 0 days    Minutes of Exercise per Session: 0 min  Stress: No Stress Concern Present (05/14/2023)   [employer] of Occupational Health - Occupational Stress Questionnaire    Feeling of Stress : Not at all  Social Connections: Socially Isolated (05/14/2023)   Social Connection and Isolation Panel [NHANES]    Frequency of Communication with Friends and Family: More than three times a week    Frequency of Social Gatherings with Friends and Family: More than three times a week    Attends Religious Services: Never    Database administrator or Organizations: No    Attends Banker Meetings: Never    Marital Status: Divorced     Review of Systems: A 12 point ROS discussed and pertinent positives are indicated in the HPI above.  All other systems are negative.    Vital Signs: BP 126/85   Pulse 95   Temp 97.8 F (36.6 C) (Oral)   Resp 16   Ht 5\' 10"  (1.778 m)   Wt 210 lb (95.3 kg)   SpO2 100%   BMI 30.13 kg/m   Advance Care Plan: No documents on file    Physical Exam HENT:      Mouth/Throat:     Mouth: Mucous membranes are moist.     Pharynx: Oropharynx is clear.  Cardiovascular:     Rate and Rhythm: Normal rate and regular rhythm.     Pulses: Normal pulses.     Heart sounds: Normal heart sounds.  Pulmonary:     Effort: Pulmonary effort is normal.     Breath sounds: Normal breath sounds.  Skin:    General: Skin is warm and dry.     Comments: No rash or wounds to planned puncture site  Neurological:     Mental Status: He is alert and oriented to person, place, and time.     Comments: Dorsiflexion and plantar flexion 5/5 bilateral LE. Sensation intact.      Imaging: No results found.  Labs:  CBC: Recent Labs    03/17/23 1629 03/23/23 1014 04/14/23 1113 05/18/23 0805  WBC 3.4 3.0* 3.7* 4.0  HGB 8.5* 9.0* 8.9* 9.1*  HCT 25.5* 28.1* 28.4* 27.7*  PLT 163 193 188 162    COAGS: Recent Labs    02/20/23 1100  INR 1.2  APTT 28    BMP: Recent Labs    01/14/23 1122 01/23/23 0835 02/20/23 1100 02/22/23 0338 03/17/23 1629 03/23/23 1014 04/14/23 1113  NA 135   < > 140 134* 139  --  136  K 3.9   < > 4.0 3.3* 4.1 4.3 3.5  CL 97*   < > 102 100 100  --  101  CO2 25   < > 11* 20* 24  --  24  GLUCOSE 106*   < > 119* 90 102*  --  121*  BUN 23   < > 8 14 18   --  15  CALCIUM  9.1   < > 7.9* 8.3* 8.0*  --  8.8*  CREATININE 1.30*   < > 0.97 0.91 1.10  --  0.90  GFRNONAA >60  --  >60 >60  --   --  >60   < > = values in this interval  not displayed.    LIVER FUNCTION TESTS: Recent Labs    02/20/23 1100 02/22/23 0338 03/17/23 1629 04/14/23 1113  BILITOT 1.2 1.5* 0.3 0.5  AST 111* 37 64* 63*  ALT 19 14 21 30   ALKPHOS 94 80 109 106  PROT 7.4 6.9 6.4 7.3  ALBUMIN 3.8 3.6 4.0 3.6    TUMOR MARKERS: No results for input(s): "AFPTM", "CEA", "CA199", "CHROMGRNA" in the last 8760 hours.  Assessment and Plan:  Request for image guided bone marrow biopsy and aspiration approved and scheduled for 4/21. No contraindications for procedure identified  in ROS, PR, or pre-sedation check list. VSS. Afebrile. Hgb (9.1) and Plts (162) within acceptable range for procedure. CT 12/09/22 is most recent imaging of pelvis, no osseous findings at that time.   Risks and benefits of bone marrow biopsy and aspiration was discussed with the patient and/or patient's family including, but not limited to bleeding, infection, damage to adjacent structures or low yield requiring additional tests.  All of the questions were answered and there is agreement to proceed.  Consent signed and in chart.   Thank you for allowing our service to participate in George Gomez 's care.    Electronically Signed: Terressa Fess, NP   05/18/2023, 8:27 AM     I spent a total of  30 Minutes   in face to face in clinical consultation, greater than 50% of which was counseling/coordinating care for image guided bone marrow biopsy and aspiration.    (A copy of this note was sent to the referring provider and the time of visit.)

## 2023-05-18 NOTE — Procedures (Signed)
 Interventional Radiology Procedure Note  Procedure:  Image guided aspirate and core biopsy of left iliac bone Complications: None Recommendations: - Bedrest supine x 1 hrs - OTC's PRN  Pain - Follow biopsy results  Signed,  Marciano Settles. Mabel Savage, DO

## 2023-05-19 ENCOUNTER — Other Ambulatory Visit: Payer: Self-pay

## 2023-05-20 ENCOUNTER — Other Ambulatory Visit: Payer: Self-pay

## 2023-05-20 LAB — SURGICAL PATHOLOGY

## 2023-05-21 ENCOUNTER — Other Ambulatory Visit: Payer: Self-pay

## 2023-05-22 ENCOUNTER — Other Ambulatory Visit (HOSPITAL_COMMUNITY): Payer: Self-pay

## 2023-05-22 ENCOUNTER — Other Ambulatory Visit: Payer: Self-pay

## 2023-05-25 ENCOUNTER — Other Ambulatory Visit (HOSPITAL_COMMUNITY): Payer: Self-pay

## 2023-05-25 ENCOUNTER — Other Ambulatory Visit: Payer: Self-pay

## 2023-05-26 ENCOUNTER — Encounter: Payer: Self-pay | Admitting: Oncology

## 2023-05-26 ENCOUNTER — Inpatient Hospital Stay: Attending: Oncology | Admitting: Oncology

## 2023-05-27 ENCOUNTER — Encounter (HOSPITAL_COMMUNITY): Payer: Self-pay | Admitting: Oncology

## 2023-05-29 ENCOUNTER — Encounter (HOSPITAL_COMMUNITY): Payer: Self-pay | Admitting: Oncology

## 2023-06-01 ENCOUNTER — Other Ambulatory Visit: Payer: Self-pay

## 2023-06-05 ENCOUNTER — Other Ambulatory Visit: Payer: Self-pay

## 2023-06-08 ENCOUNTER — Other Ambulatory Visit: Payer: Self-pay

## 2023-06-11 ENCOUNTER — Other Ambulatory Visit: Payer: Self-pay

## 2023-06-17 ENCOUNTER — Encounter: Payer: Self-pay | Admitting: Oncology

## 2023-06-17 ENCOUNTER — Inpatient Hospital Stay: Attending: Oncology | Admitting: Oncology

## 2023-06-17 VITALS — BP 134/61 | HR 90 | Temp 98.3°F | Resp 20

## 2023-06-17 DIAGNOSIS — D643 Other sideroblastic anemias: Secondary | ICD-10-CM

## 2023-06-17 DIAGNOSIS — D509 Iron deficiency anemia, unspecified: Secondary | ICD-10-CM | POA: Insufficient documentation

## 2023-06-17 NOTE — Progress Notes (Signed)
 Hematology/Oncology Consult note [facility]  Telephone:([phone] [phone]  Patient Care Team: Cannady, Jolene T, NP as PCP - General (Nurse Practitioner) Selena Daily, MD as Consulting Physician (Gastroenterology) Avonne Boettcher, MD as Consulting Physician (Hematology and Oncology) Quintin Buckle, DO as Consulting Physician (Gastroenterology) Ave Leisure, LCSW as [facility] Care Management Roxie Cord, RN as [facility], [place], [place]   Name of the patient: George Gomez  [id number]  12-Dec-1960   Date of visit: 06/17/23  Diagnosis-  sideroblastic anemia alcohol  use versus MDS   Chief complaint/ Reason for visit-discussed bone marrow biopsy results and further management  Heme/Onc history:  Patient is a 63 year old African-American male who is transferring his care from Dr. Beverely Buba to me.He has been followed for his pancytopenia but mainly anemia.  Hemoglobin has remained around 10 for the last 3 years.  He also has mild intermittent leukopenia with a white count that fluctuates between 3.3-4 mainly neutropenia.  Platelet counts have been mostly normal except for intermittent mild low platelet counts between 120s to 140s.  He has prior history of B12 deficiency and he gets monthly B12 injections.  History of folate deficiency but of late that has been normal.  He had a flow cytometry in December 2020 that was normal.  Anemia work-up including TSH copper  zinc  ANA hepatitis testing and HIV testing was negative.Patient has had an elevated ferritin which has been again a longstanding issue and his ferritin levels fluctuate between [phone].  He had HFE gene testing done in the past which was negative.LFTs have shown mildly elevated AST and ALT intermittently but no hyperbilirubinemia.MR eneterogram in October 2021 showed normal liver size and configuration.  No splenomegaly.    BM biopsy on 4/21 showed : While the  aspirate smear slides are somewhat suboptimal given limited spicules, the red blood cell morphology shows a prominent erythroid hyperplasia with left shift with vascularization and prominent basophilic stippling with siderotic granules.  The siderotic granules and basophilic stippling are also prominently noted in the peripheral blood.  While there is mild nuclear irregularity in the later nucleated red blood cell precursors this finding is not prominent.  There is a subset of red blood cell vacuolization the iron  stain shows prominent ring sideroblasts the overall marrow cellularity is normocellular to mildly hypercellular, predominantly secondary to this relative to absolute erythroid hyperplasia with left shift.  The myeloid series appears to show a relative at to absolute hypoplasia but with only borderline leukopenia/neutropenia in the peripheral blood.  While the megakaryocytes are limited on aspirate they are increased on the biopsy and show a subset of forms that show separated nuclei (i.e. palm balls) as well as some abnormal lobulation suggestive of megakaryocytic dyspoiesis.  In addition, there are large/giant platelets in the peripheral blood.  The above findings are consistent with a sideroblastic anemia.  While definitive dysplasia is not identified for definitive diagnosis of a myelodysplastic syndrome, it is suggested in the megakaryocytic lineage as well as the erythroid lineage with the ring sideroblasts; however, while the ring sideroblasts as well as the presence of  vacuolization suggests possible red blood cell dysplasia these findings can also be seen in toxicities as well as in defective heme biosynthesis/mitochondrial dysfunction; however most of these forms are congenital and are identified by at least early adulthood.  A myelodysplastic syndrome therefore is the most likely culprit   Interval history-patient consumes 1/4 pint of whiskey every day.  He has  mild  fatigue but denies other complaints at this time  ECOG PS- 1 Pain scale- 0  Review of systems- Review of Systems  Constitutional:  Positive for malaise/fatigue. Negative for chills, fever and weight loss.  HENT:  Negative for congestion, ear discharge and nosebleeds.   Eyes:  Negative for blurred vision.  Respiratory:  Negative for cough, hemoptysis, sputum production, shortness of breath and wheezing.   Cardiovascular:  Negative for chest pain, palpitations, orthopnea and claudication.  Gastrointestinal:  Negative for abdominal pain, blood in stool, constipation, diarrhea, heartburn, melena, nausea and vomiting.  Genitourinary:  Negative for dysuria, flank pain, frequency, hematuria and urgency.  Musculoskeletal:  Negative for back pain, joint pain and myalgias.  Skin:  Negative for rash.  Neurological:  Negative for dizziness, tingling, focal weakness, seizures, weakness and headaches.  Endo/Heme/Allergies:  Does not bruise/bleed easily.  Psychiatric/Behavioral:  Negative for depression and suicidal ideas. The patient does not have insomnia.       Allergies  Allergen Reactions   Lisinopril  Swelling   Losartan  Swelling     Past Medical History:  Diagnosis Date   Acute on chronic renal failure (HCC) 03/20/2016   Acute renal failure (ARF) (HCC) 08/28/2016   Acute renal failure (HCC) 10/12/2018   Anemia    B12 deficiency 12/11/2017   Clostridium difficile diarrhea 03/18/2016   Diabetes mellitus without complication (HCC)    Diverticulitis    Pt states diverticulitis   EtOH dependence (HCC)    Folate deficiency 07/25/2018   GI bleed 05/30/2017   Hypercholesteremia    Hypertension    Melena 10/21/2018   Weight loss 09/15/2018     Past Surgical History:  Procedure Laterality Date   COLONOSCOPY WITH PROPOFOL  N/A 10/16/2018   Procedure: COLONOSCOPY WITH PROPOFOL ;  Surgeon: Selena Daily, MD;  Location: [facility] ENDOSCOPY;  Service: Gastroenterology;  Laterality: N/A;    ENTEROSCOPY N/A 10/16/2018   Procedure: ENTEROSCOPY;  Surgeon: Selena Daily, MD;  Location: [facility] ENDOSCOPY;  Service: Gastroenterology;  Laterality: N/A;   ESOPHAGOGASTRODUODENOSCOPY N/A 07/02/2018   Procedure: ESOPHAGOGASTRODUODENOSCOPY (EGD);  Surgeon: Selena Daily, MD;  Location[facility] ENDOSCOPY;  Service: Gastroenterology;  Laterality: N/A;   ESOPHAGOGASTRODUODENOSCOPY (EGD) WITH PROPOFOL  N/A 06/01/2017   Procedure: ESOPHAGOGASTRODUODENOSCOPY (EGD) WITH PROPOFOL ;  Surgeon: Luke Salaam, MD;  Location: [facility] ENDOSCOPY;  Service: Gastroenterology;  Laterality: N/A;   GIVENS CAPSULE STUDY N/A 10/17/2018   Procedure: GIVENS CAPSULE STUDY;  Surgeon: Selena Daily, MD;  Location: [facility] ENDOSCOPY;  Service: Gastroenterology;  Laterality: N/A;   GIVENS CAPSULE STUDY N/A 08/02/2019   Procedure: GIVENS CAPSULE STUDY;  Surgeon: Selena Daily, MD;  Location: [facility] ENDOSCOPY;  Service: Gastroenterology;  Laterality: N/A;   IR BONE MARROW BIOPSY & ASPIRATION  05/18/2023   none      Social History   Socioeconomic History   Marital status: Divorced    Spouse name: Not on file   Number of children: 0   Years of education: Not on file   Highest education level: Not on file  Occupational History   Occupation: on disability  Tobacco Use   Smoking status: Never   Smokeless tobacco: Never  Vaping Use   Vaping status: Never Used  Substance and Sexual Activity   Alcohol  use: Yes    Alcohol /week: 21.0 standard drinks of alcohol     Types: 21 Shots of liquor per week    Comment: 3 shots of liquor daily   Drug use: No   Sexual activity: Yes  Other Topics Concern  Not on file  Social History Narrative   Not on file   Social Drivers of Health   Financial Resource Strain: Low Risk  (05/14/2023)   Overall Financial Resource Strain (CARDIA)    Difficulty of Paying Living Expenses: Not very hard  Food Insecurity: No Food Insecurity (05/14/2023)   Hunger Vital Sign    Worried About  Running Out of Food in the Last Year: Never true    Ran Out of Food in the Last Year: Never true  Transportation Needs: No Transportation Needs (05/14/2023)   PRAPARE - Administrator, Civil Service (Medical): No    Lack of Transportation (Non-Medical): No  Physical Activity: Inactive (05/14/2023)   Exercise Vital Sign    Days of Exercise per Week: 0 days    Minutes of Exercise per Session: 0 min  Stress: No Stress Concern Present (05/14/2023)   [employer] of Occupational Health - Occupational Stress Questionnaire    Feeling of Stress : Not at all  Social Connections: Socially Isolated (05/14/2023)   Social Connection and Isolation Panel [NHANES]    Frequency of Communication with Friends and Family: More than three times a week    Frequency of Social Gatherings with Friends and Family: More than three times a week    Attends Religious Services: Never    Database administrator or Organizations: No    Attends Banker Meetings: Never    Marital Status: Divorced  Catering manager Violence: Not At Risk (05/14/2023)   Humiliation, Afraid, Rape, and Kick questionnaire    Fear of Current or Ex-Partner: No    Emotionally Abused: No    Physically Abused: No    Sexually Abused: No    Family History  Problem Relation Age of Onset   Rheum arthritis Mother    Diabetes Father    Heart disease Father    Cancer Father    COPD Sister    Rheum arthritis Sister    Diabetes Maternal Grandmother    Cancer Maternal Grandfather    Diabetes Paternal Grandmother    Cancer Paternal Grandfather      Current Outpatient Medications:    allopurinol  (ZYLOPRIM ) 300 MG tablet, Take 1 tablet (300 mg total) by mouth daily., Disp: 90 tablet, Rfl: 4   atorvastatin  (LIPITOR) 40 MG tablet, Take 1 tablet (40 mg total) by mouth daily., Disp: 90 tablet, Rfl: 4   Blood Glucose Monitoring Suppl (ONETOUCH VERIO) w/Device KIT, Use to check blood sugar 3 times a day and document results,  bring to appointments.  Goal is <130 fasting blood sugar and <180 two hours after meals., Disp: 1 kit, Rfl: 0   Cholecalciferol 50 MCG (2000 UT) CAPS, Take 1 capsule by mouth daily., Disp: , Rfl:    cyanocobalamin  (VITAMIN B12) 1000 MCG tablet, Take 1 tablet (1,000 mcg total) by mouth daily., Disp: 90 tablet, Rfl: 4   gabapentin  (NEURONTIN ) 300 MG capsule, Take 1 capsule (300 mg total) by mouth every morning AND 1 capsule (300 mg total) daily at 12 noon AND 2 capsules (600 mg total) at bedtime., Disp: 360 capsule, Rfl: 4   glucose blood (ONETOUCH VERIO) test strip, Use to check blood sugar 3 times a day and document results, bring to appointments.  Goal is <130 fasting blood sugar and <180 two hours after meals., Disp: 100 each, Rfl: 12   Lancets (ONETOUCH ULTRASOFT) lancets, Use to check blood sugar 3 times a day and document results, bring to appointments.  Goal  is <130 fasting blood sugar and <180 two hours after meals., Disp: 100 each, Rfl: 12   magnesium  oxide (MAG-OX) 400 (240 Mg) MG tablet, Take 1 tablet (400 mg total) by mouth 2 (two) times daily., Disp: 180 tablet, Rfl: 4   ondansetron  (ZOFRAN ) 4 MG tablet, Take 1 tablet (4 mg total) by mouth every 8 (eight) hours as needed., Disp: 15 tablet, Rfl: 0   pantoprazole  (PROTONIX ) 40 MG tablet, Take 1 tablet (40 mg total) by mouth daily., Disp: 90 tablet, Rfl: 4   propranolol  ER (INDERAL  LA) 60 MG 24 hr capsule, Take 1 capsule (60 mg total) by mouth daily., Disp: 90 capsule, Rfl: 4   tiZANidine  (ZANAFLEX ) 2 MG tablet, TAKE 1 TABLET(2 MG) BY MOUTH EVERY 8 HOURS AS NEEDED FOR MUSCLE SPASMS, Disp: 30 tablet, Rfl: 0   triamcinolone  cream (KENALOG ) 0.1 %, Apply 1 application topically 2 (two) times daily., Disp: 453.6 g, Rfl: 0   VOLTAREN 1 % GEL, , Disp: , Rfl:   Physical exam:  Vitals:   06/17/23 1141  BP: 134/61  Pulse: 90  Resp: 20  Temp: 98.3 F (36.8 C)  SpO2: 100%   Physical Exam Cardiovascular:     Rate and Rhythm: Normal rate and  regular rhythm.     Heart sounds: Normal heart sounds.  Pulmonary:     Effort: Pulmonary effort is normal.     Breath sounds: Normal breath sounds.  Abdominal:     General: Bowel sounds are normal.     Palpations: Abdomen is soft.  Skin:    General: Skin is warm and dry.  Neurological:     Mental Status: He is alert and oriented to person, place, and time.      I have personally reviewed labs listed below:    Latest Ref Rng & Units 04/14/2023   11:13 AM  CMP  Glucose 70 - 99 mg/dL 914   BUN 8 - 23 mg/dL 15   Creatinine 7.82 - 1.24 mg/dL 9.56   Sodium 213 - 086 mmol/L 136   Potassium 3.5 - 5.1 mmol/L 3.5   Chloride 98 - 111 mmol/L 101   CO2 22 - 32 mmol/L 24   Calcium  8.9 - 10.3 mg/dL 8.8   Total Protein 6.5 - 8.1 g/dL 7.3   Total Bilirubin 0.0 - 1.2 mg/dL 0.5   Alkaline Phos 38 - 126 U/L 106   AST 15 - 41 U/L 63   ALT 0 - 44 U/L 30       Latest Ref Rng & Units 05/18/2023    8:05 AM  CBC  WBC 4.0 - 10.5 K/uL 4.0   Hemoglobin 13.0 - 17.0 g/dL 9.1   Hematocrit 57.8 - 52.0 % 27.7   Platelets 150 - 400 K/uL 162       Assessment and plan- Patient is a 63 y.o. male with history of chronic normal anemia here to discuss further management  Patient baseline hemoglobin has been running around 10 for the last 4 years since January 2025 had drifted down to 8.5-9.5 range.  Peripheral blood anemia workup did not reveal any obvious cause of anemia and therefore patient underwent bone marrow biopsy.CT shows Red cell morphology with prominent erythroid hyperplasia with left-shifted and vascularization and basophilic stippling with fibrotic granules.  Subset of red blood cell vacuolization iron  stain shows prominent ring sideroblasts.  Overall findings were consistent with sideroblastic anemia which can be seen in toxicities as well as possible MDS.  Cytogenetics were normal  and [employer] showed CBL mutations but no alteration in SF 3 B1, CALR flit 3 IDH 1 2, JAK2, MPL and NPM1 or T p53.   Given his overall clinical picture there is a possibility that his sideroblastic anemia is because of his alcohol  use.  I have encouraged him to cut down and eventually quit drinking alcohol  altogether.  We will see if there is any improvement in his anemia after stopping alcohol .  I will plan to repeat his CBC in 2 months time.  Given that MDS was not ruled out and he did not have CBL mutation noted, I would consider giving him a trial of EPOTo see if we can keep his hemoglobin between 10-11.  Discussed risks and effects of EPO including all but not limited to possible thromboembolic events.  Patient understands and agrees to proceed as planned.  Decision to start EPO will be based on labs in 2 months.  Plan to check his thiamine  and copper  level in 2 months   Visit Diagnosis 1. Sideroblastic anemia (HCC)      Dr. Seretha Dance, MD, MPH [facility] [phone] 06/17/2023 11:26 AM

## 2023-06-18 ENCOUNTER — Encounter: Payer: Self-pay | Admitting: Hematology and Oncology

## 2023-06-21 NOTE — Patient Instructions (Signed)

## 2023-06-24 ENCOUNTER — Telehealth: Payer: Self-pay

## 2023-06-26 ENCOUNTER — Encounter: Payer: Self-pay | Admitting: Nurse Practitioner

## 2023-06-26 ENCOUNTER — Ambulatory Visit (INDEPENDENT_AMBULATORY_CARE_PROVIDER_SITE_OTHER): Admitting: Nurse Practitioner

## 2023-06-26 VITALS — BP 128/78 | HR 71 | Temp 98.0°F | Ht 70.0 in | Wt 200.6 lb

## 2023-06-26 DIAGNOSIS — E1140 Type 2 diabetes mellitus with diabetic neuropathy, unspecified: Secondary | ICD-10-CM | POA: Diagnosis not present

## 2023-06-26 DIAGNOSIS — E1159 Type 2 diabetes mellitus with other circulatory complications: Secondary | ICD-10-CM | POA: Diagnosis not present

## 2023-06-26 DIAGNOSIS — E1129 Type 2 diabetes mellitus with other diabetic kidney complication: Secondary | ICD-10-CM | POA: Diagnosis not present

## 2023-06-26 DIAGNOSIS — E1122 Type 2 diabetes mellitus with diabetic chronic kidney disease: Secondary | ICD-10-CM | POA: Diagnosis not present

## 2023-06-26 DIAGNOSIS — R809 Proteinuria, unspecified: Secondary | ICD-10-CM

## 2023-06-26 DIAGNOSIS — N183 Chronic kidney disease, stage 3 unspecified: Secondary | ICD-10-CM

## 2023-06-26 DIAGNOSIS — I700 Atherosclerosis of aorta: Secondary | ICD-10-CM

## 2023-06-26 DIAGNOSIS — D643 Other sideroblastic anemias: Secondary | ICD-10-CM

## 2023-06-26 DIAGNOSIS — F1010 Alcohol abuse, uncomplicated: Secondary | ICD-10-CM

## 2023-06-26 DIAGNOSIS — E1169 Type 2 diabetes mellitus with other specified complication: Secondary | ICD-10-CM

## 2023-06-26 DIAGNOSIS — D696 Thrombocytopenia, unspecified: Secondary | ICD-10-CM

## 2023-06-26 LAB — BAYER DCA HB A1C WAIVED: HB A1C (BAYER DCA - WAIVED): 5.5 % (ref 4.8–5.6)

## 2023-06-26 LAB — MICROALBUMIN, URINE WAIVED
Creatinine, Urine Waived: 300 mg/dL (ref 10–300)
Microalb, Ur Waived: 80 mg/L — ABNORMAL HIGH (ref 0–19)

## 2023-06-26 NOTE — Assessment & Plan Note (Signed)
 Chronic, ongoing.  Followed by hematology and recent notes reviewed.  Continue to collaborate with them.

## 2023-06-26 NOTE — Assessment & Plan Note (Signed)
 Chronic, ongoing with A1c 5.5% today. Continue off of Metformin .  Urine ALB 80 (May 2025), cannot take ACE or ARB due to severe reactions in past.  Continue Gabapentin  for neuropathy pain.  Recommend he check BS 3 days a week and document for provider visits.   - Foot exam up to date - Needs to schedule eye exam - Vaccines up to date

## 2023-06-26 NOTE — Assessment & Plan Note (Signed)
 Ongoing, noted on past imaging.  Continue statin therapy and recommend cessation of alcohol use.

## 2023-06-26 NOTE — Assessment & Plan Note (Signed)
 Chronic, ongoing.  Continue current medication regimen and adjust as needed. Lipid panel today.

## 2023-06-26 NOTE — Assessment & Plan Note (Signed)
 Chronic, ongoing with A1c 5.5%today.  At this time continue diet focus and restart Metformin  as needed.  Urine ALB 80 (May 2025), cannot take ACE or ARB.  Continue Gabapentin  for neuropathy pain +  B12 and place Voltaren gel on feet at night.  Recommend he check BS 3 days a week and document for provider visits.   - Foot exam up to date - Needs to schedule eye exam - Vaccines up to date - Statin on board.

## 2023-06-26 NOTE — Assessment & Plan Note (Signed)
 Chronic, ongoing with A1c 5.5% today and remains off Metformin . Urine ALB 80 (May 2025), cannot take ACE or ARB.  Continue Gabapentin  for neuropathy pain.  Recommend he check BS 3 days a week and document for provider visits.   - Foot exam up to date - Needs to schedule eye exam - Vaccines up to date - Statin on board.

## 2023-06-26 NOTE — Assessment & Plan Note (Signed)
 Chronic, ongoing.  Followed by hematology and recent notes reviewed.  Continue this collaboration, recent note reviewed.

## 2023-06-26 NOTE — Assessment & Plan Note (Signed)
 Chronic, stable.  BP at goal in office today.  Recommend he monitor BP at least a few mornings a week at home and document.  DASH diet at home.  Continue current medication regimen and adjust as needed, Propranolol  only at this time.  Labs today BMP and urine ALB.  Urine ALB 80 May 2025, cannot take ACE or ARB.

## 2023-06-26 NOTE — Assessment & Plan Note (Signed)
 Chronic, he reports he has been cutting back.  Check BMP today and consider imaging if LFT elevations in future.  Recommend working towards complete cessation of alcohol  use.  Continue to collaborate with GI.

## 2023-06-26 NOTE — Progress Notes (Signed)
 BP 128/78 (BP Location: Left Arm, Patient Position: Sitting, Cuff Size: Normal) Comment: did have a shot of liquor before bed  Pulse 71   Temp 98 F (36.7 C) (Oral)   Ht 5\' 10"  (1.778 m)   Wt 200 lb 9.6 oz (91 kg)   SpO2 97%   BMI 28.78 kg/m    Subjective:    Patient ID: George Gomez, male    DOB: Mar 02, 1960, 63 y.o.   MRN: [id number]  HPI: George Gomez is a 63 y.o. male  Chief Complaint  Patient presents with   Diabetes    No recent eye exam per patient   Hyperlipidemia   Hypertension   Chronic Kidney Disease   DIABETES A1c 5.4% in January.  Currently is diet controlled, took Metformin  in past. Takes Gabapentin  for neuropathy at baseline this is worse at night, burning and sharp pains.  Currently taking B12 supplements. History of low magnesium  on labs, taking supplement. Hypoglycemic episodes:no Polydipsia/polyuria: no Visual disturbance: no Chest pain: no Paresthesias: no Glucose Monitoring: no  Accucheck frequency: Not Checking  Fasting glucose:  Post prandial:  Evening:  Before meals: Taking Insulin ?: no  Long acting insulin :  Short acting insulin : Blood Pressure Monitoring: not checking Retinal Examination: Not up to Date -- [facility] Foot Exam: Up to Date Pneumovax: Up To Date Influenza: Up to Date Aspirin: no   HYPERTENSION / HYPERLIPIDEMIA Takes Propranolol  + Atorvastatin . Previously took ACE and ARB, both with severe reactions to both per his report. Took Amlodipine  in past. Drinks a shot or two of liquor daily -- vodka, reports cutting back on this. Satisfied with current treatment? yes Duration of hypertension: chronic BP monitoring frequency: every now and then BP range:  BP medication side effects: no Duration of hyperlipidemia: chronic Cholesterol medication side effects: no Cholesterol supplements: none Medication compliance: good compliance Aspirin: no Recent stressors: no Recurrent headaches: no Visual changes: no Palpitations:  no Dyspnea: no Chest pain: no Lower extremity edema: occasional Dizzy/lightheaded: sometimes  CHRONIC KIDNEY DISEASE CKD status: stable Medications renally dose: yes Previous renal evaluation: no Pneumovax:  Up to Date Influenza Vaccine:  Up to Date  ANEMIA (Sideroblastic) Continues to visit with hematology, last on 06/17/23.  B12 supplement at home. Takes Protonix  daily for GERD.   Anemia status: stable Etiology of anemia: B12 and iron  deficiency Duration of anemia treatment: via hematology Compliance with treatment: good compliance Iron  supplementation side effects: no Severity of anemia: moderate Fatigue: no Decreased exercise tolerance: no  Dyspnea on exertion: no Palpitations: no Bleeding: no Pica: no      06/26/2023    9:10 AM 05/14/2023   12:31 PM 05/14/2023   10:29 AM 03/17/2023    3:50 PM 03/10/2023    9:20 AM  Depression screen PHQ 2/9  Decreased Interest 1 0 0 0 0  Down, Depressed, Hopeless 0 0 0 0 0  PHQ - 2 Score 1 0 0 0 0  Altered sleeping 1   1   Tired, decreased energy 2   3   Change in appetite 1   1   Feeling bad or failure about yourself  0   0   Trouble concentrating 0   0   Moving slowly or fidgety/restless 2   1   Suicidal thoughts 0   0   PHQ-9 Score 7   6   Difficult doing work/chores Not difficult at all   Not difficult at all        06/26/2023  9:10 AM 05/14/2023   12:31 PM 03/17/2023    3:50 PM 07/24/2022   10:28 AM  GAD 7 : Generalized Anxiety Score  Nervous, Anxious, on Edge 1 0 1 0  Control/stop worrying 0 0 0 0  Worry too much - different things 0 0 0 0  Trouble relaxing 0 1 1 0  Restless 0 0 1 1  Easily annoyed or irritable 2 0 1 1  Afraid - awful might happen 0 0 0 0  Total GAD 7 Score 3 1 4 2   Anxiety Difficulty Not difficult at all Somewhat difficult Somewhat difficult Not difficult at all   Relevant past medical, surgical, family and social history reviewed and updated as indicated. Interim medical history since our last  visit reviewed. Allergies and medications reviewed and updated.  Review of Systems  Constitutional:  Negative for activity change, diaphoresis, fatigue and fever.  Respiratory:  Negative for cough, chest tightness, shortness of breath and wheezing.   Cardiovascular:  Negative for chest pain, palpitations and leg swelling.  Endocrine: Negative for cold intolerance, heat intolerance, polydipsia, polyphagia and polyuria.  Musculoskeletal:  Positive for arthralgias.  Neurological:  Negative for dizziness, syncope, weakness, light-headedness, numbness and headaches.  Psychiatric/Behavioral: Negative.      Per HPI unless specifically indicated above     Objective:    BP 128/78 (BP Location: Left Arm, Patient Position: Sitting, Cuff Size: Normal) Comment: did have a shot of liquor before bed  Pulse 71   Temp 98 F (36.7 C) (Oral)   Ht 5\' 10"  (1.778 m)   Wt 200 lb 9.6 oz (91 kg)   SpO2 97%   BMI 28.78 kg/m   Wt Readings from Last 3 Encounters:  06/26/23 200 lb 9.6 oz (91 kg)  05/18/23 210 lb (95.3 kg)  04/21/23 202 lb (91.6 kg)    Physical Exam Vitals and nursing note reviewed.  Constitutional:      General: He is awake. He is not in acute distress.    Appearance: He is well-developed and well-groomed. He is obese. He is not ill-appearing or toxic-appearing.  HENT:     Head: Normocephalic and atraumatic.     Right Ear: Hearing and external ear normal. No drainage.     Left Ear: Hearing and external ear normal. No drainage.  Eyes:     General: Lids are normal.        Right eye: No discharge.        Left eye: No discharge.     Conjunctiva/sclera: Conjunctivae normal.     Pupils: Pupils are equal, round, and reactive to light.  Neck:     Thyroid : No thyromegaly.     Vascular: No carotid bruit.  Cardiovascular:     Rate and Rhythm: Normal rate and regular rhythm.     Heart sounds: Normal heart sounds, S1 normal and S2 normal. No murmur heard.    No gallop.  Pulmonary:      Effort: Pulmonary effort is normal. No accessory muscle usage or respiratory distress.     Breath sounds: Normal breath sounds.  Abdominal:     General: Bowel sounds are normal.     Palpations: Abdomen is soft.  Musculoskeletal:     Cervical back: Normal range of motion and neck supple.     Right lower leg: Edema (trace) present.     Left lower leg: Edema (trace) present.  Lymphadenopathy:     Cervical: No cervical adenopathy.  Skin:    General: Skin  is warm and dry.     Capillary Refill: Capillary refill takes less than 2 seconds.     Findings: No rash.  Neurological:     Mental Status: He is alert and oriented to person, place, and time.     Cranial Nerves: Cranial nerves 2-12 are intact.     Sensory: Sensation is intact.     Coordination: Coordination is intact.     Gait: Gait is intact.     Deep Tendon Reflexes: Reflexes are normal and symmetric.     Reflex Scores:      Brachioradialis reflexes are 2+ on the right side and 2+ on the left side.      Patellar reflexes are 2+ on the right side and 2+ on the left side. Psychiatric:        Attention and Perception: Attention normal.        Mood and Affect: Mood normal.        Speech: Speech normal.        Behavior: Behavior normal. Behavior is cooperative.        Thought Content: Thought content normal.    Results for orders placed or performed during the hospital encounter of 05/18/23  Surgical pathology   Collection Time: 05/18/23 12:00 AM  Result Value Ref Range   SURGICAL PATHOLOGY      Surgical Pathology CASE: [id number] PATIENT: Kylen Sumners Bone Marrow Report     Clinical History: Chronic anemia     DIAGNOSIS:  BONE MARROW, ASPIRATE, CLOT, CORE: -  Sideroblastic anemia, see note  PERIPHERAL BLOOD: -  Normochromic normocytic anemia  Note: While the aspirate smear slides are somewhat suboptimal given limited spicules, the red blood cell morphology shows a prominent erythroid hyperplasia with left  shift with vascularization and prominent basophilic stippling with siderotic granules.  The siderotic granules and basophilic stippling are also prominently noted in the peripheral blood.  While there is mild nuclear irregularity in the later nucleated red blood cell precursors this finding is not prominent.  There is a subset of red blood cell vacuolization the iron  stain shows prominent ring sideroblasts the overall marrow cellularity is normocellular to mildly hypercellular, predominantly secondary to this relative to absolute erythroid  hyperplasia with left shift.  The myeloid series appears to show a relative at to absolute hypoplasia but with only borderline leukopenia/neutropenia in the peripheral blood.  While the megakaryocytes are limited on aspirate they are increased on the biopsy and show a subset of forms that show separated nuclei (i.e. palm balls) as well as some abnormal lobulation suggestive of megakaryocytic dyspoiesis.  In addition, there are large/giant platelets in the peripheral blood.  The above findings are consistent with a sideroblastic anemia.  While definitive dysplasia is not identified for definitive diagnosis of a myelodysplastic syndrome, it is suggested in the megakaryocytic lineage as well as the erythroid lineage with the ring sideroblasts; however, while the ring sideroblasts as well as the presence of  vacuolization suggests possible red blood cell dysplasia these findings can also be seen in toxicities as well as in defective heme biosynthesis/mitochondrial dysfunct ion; however most of these forms are congenital and are identified by at least early adulthood.  A myelodysplastic syndrome therefore is the most likely culprit and conventional cytogenetics as well as myeloid NexGen sequencing are pending to further characterize this sideroblastic anemia.  MICROSCOPIC DESCRIPTION:  PERIPHERAL BLOOD SMEAR: The peripheral blood smear and indices  are reviewed revealing a normochromic normocytic anemia.  The red blood cells  show a moderate amount of anisocytosis with the presence of a range of microcytes and rare spherocytes as well as larger sized cells. In addition, there is a very mild poikilocytosis with rare red blood cell fragments, schistocytes and rare teardrops.  In addition, there are prominent red blood cells with basophilic stippling and siderotic granules as well as rare nucleated red blood cells.  The platelets show a mild degree of platelet clumping which is not considered clinically significant.  There is also a rare large  platelet.  The white blood cells are overall unremarkable.  BONE MARROW ASPIRATE: The bone marrow aspirate smear slides contain a limited number of spicules that appear hypocellular on the aspirate. Some of these may not be true spicules and appear almost fibrin/platelet clot-like in the aspirate may not be appropriately sampled. Erythroid precursors: Erythroid series is present in a relative hyperplasia with left shift with numerous basophilic erythroblasts.  In addition, there is the presence of cytoplasmic vacuolization to the red cells definitive megaloblastic change, however, is not present.  There is a very minor degree of nuclear irregularity to the mature red blood cell forms.  Basophilic stippling/siderotic granules are present in the later nucleated erythroid forms. Granulocytic precursors: The myeloid series is present in relative to absolute decreased number but appears otherwise show orderly maturation and unremarkable morphology. Megakaryocytes:  Rare, s cattered megakaryocytes are present and overall show unremarkable morphology. Lymphocytes/plasma cells: Lymphocytes and plasma cells are small round mature and are not considered increased (4% plasma cells).  TOUCH PREPARATIONS: The touch prep shows similar morphology to the red blood cells and shows slightly more myeloid  precursors than some of the aspirate smear slides, refer to above for further description  CLOT AND BIOPSY: The decalcified bone marrow biopsy consists of a core of cortical and trabecular bone with a moderate amount of aspiration artifact as well as hematopoietic marrow with a range of cellularity from 10 to 50% with an overall cellularity of approximately 30% overall there is a prominent erythroid hyperplasia with left shift.  There is a relative and absolute myeloid hypoplasia.  Megakaryocytes are better identified on the biopsy than the aspirate and are increased at up to 12/hpf some of these megakaryocytes appear to have separated nuclei (possibl e upon ball forms) suggestive of dyspoiesis  Clot section consists almost exclusively of blood/hemorrhage without significant hematopoietic precursors.  This could correlate to the core aspirate material.  IMMUNOHISTOCHEMICAL STAINS: Immunohistochemical stains were performed on the biopsy with appropriate controls CD34 highlights blasts which are not significantly increased as well as aberrantly staining a subset of the megakaryocytes.  CD117 highlights mildly increased immature precursors which appear to predominantly correspond to the significantly increased immature erythroid compartment identified by E-cadherin.  A myeloperoxidase stain highlights the myeloid compartment and confirms the relative to absolute myeloid hypoplasia  IRON  STAIN: Iron  stains are performed on a bone marrow aspirate or touch imprint smear and section of clot. The controls stained appropriately.       Storage Iron : Unable to accurately ascertain given the extremely limited number of defini tive spicules; however, there does appear to be some level of storage iron  present.      Ring Sideroblasts: Significantly increased presence of ring sideroblasts.  ADDITIONAL DATA/TESTING: Conventional cytogenetics as well as myeloid NexGen sequencing are pending to  further characterize the morphologic bone marrow findings  CELL COUNT DATA:  Bone Marrow count performed on 500 cells shows: Blasts:   0%   Myeloid:  28% Promyelocytes: 0%  Erythroid:     57% Myelocytes:    4%   Lymphocytes:   11% Metamyelocytes:     1%   Plasma cells:  4% Bands:    4% Neutrophils:   17%  M:E ratio:     0.49 Eosinophils:   2% Basophils:     0% Monocytes:     0%  Lab Data: CBC performed on 05/18/2023 shows: WBC: 4.0 k/uL  Neutrophils:   63% Hgb: 9.1 g/dL  Lymphocytes:   16% HCT: 27.7 %    Monocytes:     13% MCV: 94.9 fL   Eosinophils:   1% RDW: 17.5 %    Basophils:     0% PLT: 162 k/uL    GROSS DESCRIPTION:  A: Aspirate smear  B: Received in B-plus fixative are tissue frag ments measuring 1 x 1 x 0.2 cm in aggregate.  The specimen is submitted in toto.  C: Received in B-plus fixative are 2 cores of bone 0.6 and 1.1 cm in length and each 0.2 cm in diameter.  The specimen is submitted in toto following decalcification.  (GRP 05/18/2023)   Final Diagnosis performed by Mark LeGolvan DO.   Electronically signed 05/20/2023 Technical and / or Professional components performed at [facility], [address] [place], [place] [address].  Immunohistochemistry Technical component (if applicable) was performed at [facility] [address], [address], [place], [place] [address].   IMMUNOHISTOCHEMISTRY DISCLAIMER (if applicable): Some of these immunohistochemical stains may have been developed and the performance characteristics determine by [facility] Some may not have been cleared or approved by the U.S. Food and Drug Administration. The FDA has determined that such clearance or approval is not n ecessary. This test is used for clinical purposes. It should not be regarded as investigational or for research. This laboratory is certified under the Clinical Laboratory Improvement Amendments of 1988 (CLIA-88) as  qualified to perform high complexity clinical laboratory testing.  The controls stained appropriately.   IHC stains are performed on formalin fixed, paraffin embedded tissue using a 3,3"diaminobenzidine (DAB) chromogen and Leica Bond Autostainer System. The staining intensity of the nucleus is score manually and is reported as the percentage of tumor cell nuclei demonstrating specific nuclear staining. The specimens are fixed in 10% Neutral Formalin for at least 6 hours and up to 72hrs. These tests are validated on decalcified tissue. Results should be interpreted with caution given the possibility of false negative results on decalcified specimens. Antibody Clones are as follows ER-clone [id number], PR-clone 16, Ki67- clone MM1. Some of these immunohistochemical stai ns may have been developed and the performance characteristics determined by [facility].   CBC with Differential/Platelet   Collection Time: 05/18/23  8:05 AM  Result Value Ref Range   WBC 4.0 4.0 - 10.5 K/uL   RBC 2.92 (L) 4.22 - 5.81 MIL/uL   Hemoglobin 9.1 (L) 13.0 - 17.0 g/dL   HCT 81.1 (L) 91.4 - 78.2 %   MCV 94.9 80.0 - 100.0 fL   MCH 31.2 26.0 - 34.0 pg   MCHC 32.9 30.0 - 36.0 g/dL   RDW 95.6 (H) 21.3 - 08.6 %   Platelets 162 150 - 400 K/uL   nRBC 0.8 (H) 0.0 - 0.2 %   Neutrophils Relative % 65 %   Neutro Abs 2.6 1.7 - 7.7 K/uL   Lymphocytes Relative 18 %   Lymphs Abs 0.7 0.7 - 4.0 K/uL   Monocytes Relative 14 %  Monocytes Absolute 0.6 0.1 - 1.0 K/uL   Eosinophils Relative 1 %   Eosinophils Absolute 0.0 0.0 - 0.5 K/uL   Basophils Relative 1 %   Basophils Absolute 0.0 0.0 - 0.1 K/uL   Immature Granulocytes 1 %   Abs Immature Granulocytes 0.04 0.00 - 0.07 K/uL  Glucose, capillary   Collection Time: 05/18/23  8:06 AM  Result Value Ref Range   Glucose-Capillary 132 (H) 70 - 99 mg/dL      Assessment & Plan:   Problem List Items Addressed This Visit       Cardiovascular and Mediastinum    Hypertension associated with type 2 diabetes mellitus (HCC)   Chronic, stable.  BP at goal in office today.  Recommend he monitor BP at least a few mornings a week at home and document.  DASH diet at home.  Continue current medication regimen and adjust as needed, Propranolol  only at this time.  Labs today BMP and urine ALB.  Urine ALB 80 May 2025, cannot take ACE or ARB.          Relevant Orders   Bayer DCA Hb A1c Waived   Basic metabolic panel with GFR   Microalbumin, Urine Waived   Aortic atherosclerosis (HCC)   Ongoing, noted on past imaging.  Continue statin therapy and recommend cessation of alcohol  use.        Endocrine   Type 2 diabetes mellitus with proteinuria (HCC) - Primary   Chronic, ongoing with A1c 5.5% today. Continue off of Metformin .  Urine ALB 80 (May 2025), cannot take ACE or ARB due to severe reactions in past.  Continue Gabapentin  for neuropathy pain.  Recommend he check BS 3 days a week and document for provider visits.   - Foot exam up to date - Needs to schedule eye exam - Vaccines up to date       Relevant Orders   Bayer DCA Hb A1c Waived   Type 2 diabetes mellitus with diabetic neuropathy (HCC)   Chronic, ongoing with A1c 5.5%today.  At this time continue diet focus and restart Metformin  as needed.  Urine ALB 80 (May 2025), cannot take ACE or ARB.  Continue Gabapentin  for neuropathy pain +  B12 and place Voltaren gel on feet at night.  Recommend he check BS 3 days a week and document for provider visits.   - Foot exam up to date - Needs to schedule eye exam - Vaccines up to date - Statin on board.      Relevant Orders   Bayer DCA Hb A1c Waived   Hyperlipidemia due to type 2 diabetes mellitus (HCC)   Chronic, ongoing.  Continue current medication regimen and adjust as needed.  Lipid panel today.        Relevant Orders   Bayer DCA Hb A1c Waived   Lipid Panel w/o Chol/HDL Ratio   CKD stage 3 due to type 2 diabetes mellitus (HCC)   Chronic, ongoing  with A1c 5.5% today and remains off Metformin . Urine ALB 80 (May 2025), cannot take ACE or ARB.  Continue Gabapentin  for neuropathy pain.  Recommend he check BS 3 days a week and document for provider visits.   - Foot exam up to date - Needs to schedule eye exam - Vaccines up to date - Statin on board.      Relevant Orders   Bayer DCA Hb A1c Waived     Hematopoietic and Hemostatic   Thrombocytopenia (HCC)   Chronic, ongoing.  Followed by  hematology and recent notes reviewed.  Continue this collaboration, recent note reviewed.        Other   Sideroblastic anemia (HCC)   Chronic, ongoing.  Followed by hematology and recent notes reviewed.  Continue to collaborate with them.      Hypomagnesemia   Recheck on labs today, taking supplement daily as instructed.  Is on PPI therapy which needs to be maintained at this time.  Recheck level today.  Recommend complete cessation of alcohol  use.      Relevant Orders   Magnesium    Alcohol  abuse   Chronic, he reports he has been cutting back.  Check BMP today and consider imaging if LFT elevations in future.  Recommend working towards complete cessation of alcohol  use.  Continue to collaborate with GI.        Follow up plan: Return in about 7 months (around 01/25/2024) for Annual Physical -- after 01/23/24.

## 2023-06-26 NOTE — Assessment & Plan Note (Signed)
 Recheck on labs today, taking supplement daily as instructed.  Is on PPI therapy which needs to be maintained at this time.  Recheck level today.  Recommend complete cessation of alcohol  use.

## 2023-06-27 LAB — BASIC METABOLIC PANEL WITH GFR
BUN/Creatinine Ratio: 12 (ref 10–24)
BUN: 12 mg/dL (ref 8–27)
CO2: 20 mmol/L (ref 20–29)
Calcium: 8.1 mg/dL — ABNORMAL LOW (ref 8.6–10.2)
Chloride: 104 mmol/L (ref 96–106)
Creatinine, Ser: 0.97 mg/dL (ref 0.76–1.27)
Glucose: 105 mg/dL — ABNORMAL HIGH (ref 70–99)
Potassium: 3.9 mmol/L (ref 3.5–5.2)
Sodium: 141 mmol/L (ref 134–144)
eGFR: 87 mL/min/{1.73_m2} (ref >59)

## 2023-06-27 LAB — MAGNESIUM: Magnesium: 1 mg/dL — ABNORMAL LOW (ref 1.6–2.3)

## 2023-06-27 LAB — LIPID PANEL W/O CHOL/HDL RATIO
Cholesterol, Total: 153 mg/dL (ref 100–199)
HDL: 64 mg/dL (ref >39)
LDL Chol Calc (NIH): 66 mg/dL (ref 0–99)
Triglycerides: 136 mg/dL (ref 0–149)
VLDL Cholesterol Cal: 23 mg/dL (ref 5–40)

## 2023-06-28 ENCOUNTER — Ambulatory Visit: Payer: Self-pay | Admitting: Nurse Practitioner

## 2023-06-28 NOTE — Progress Notes (Signed)
 Please call patient and alert him of lab results: Good morning George Gomez, your labs have returned: - Kidney function is nice and normal. - Calcium  and magnesium  levels remain low.  Please add a calcium  supplement oer the counter to supplements daily.  Magnesium  level remains low, please ensure you are taking your supplement twice a day as ordered.  Are you doing this? Also please cut back on alcohol  use. - Lipid panel remains stable.  Any questions? Keep being amazing!!  Thank you for allowing me to participate in your care.  I appreciate you. Kindest regards, Zayde Stroupe

## 2023-07-27 ENCOUNTER — Ambulatory Visit: Payer: Self-pay

## 2023-07-27 NOTE — Telephone Encounter (Signed)
 Patient noted he went out to eat around 10/11am and had two drinks.   Inquired if he should still go to the ER.  Patient encouraged to still go to the ER and to have another individual drive him accordingly.    Patient verbalized understanding. No additional questions/concern noted during the time of the call.        Copied from CRM [id number]. Topic: Clinical - Red Word Triage >> Jul 27, 2023  3:35 PM Antwanette L wrote: Red Word that prompted transfer to Nurse Triage: Patient is calling back in. Right leg is swollen. Patient spoke with NT and was advised to go to the ER. Patient has been drinking and prefers to see his pcp.

## 2023-07-27 NOTE — Telephone Encounter (Signed)
   FYI Only or Action Required?: FYI only for provider.  Patient was last seen in primary care on 06/26/2023 by Valerio Melanie DASEN, NP. Called Nurse Triage reporting Leg Swelling. Symptoms began several weeks ago. Interventions attempted: Nothing. Symptoms are: gradually worsening.  Triage Disposition: Go to ED or PCP/Alternative with Approval  pied from CRM [id number]. Topic: Clinical - Red Word Triage >> Jul 27, 2023  3:15 PM Corin V wrote: [facility] that prompted transfer to Nurse Triage: Patient entire right leg is swollen. It has been swollen for 2 days. He moved 2 exercise bikes due to flooring being redone and the next day the leg was swollen. Leg is painful to put weight on and to touch. He is walking with a cane. Reason for Disposition  [1] Swelling is painful to touch AND [2] fever  Answer Assessment - Initial Assessment Questions 1. ONSET: When did the swelling start? (e.g., minutes, hours, days)     Two days 2. LOCATION: What part of the leg is swollen?  Are both legs swollen or just one leg?     Right leg 3. SEVERITY: How bad is the swelling? (e.g., localized; mild, moderate, severe)   - Localized: Small area of swelling localized to one leg.   - MILD pedal edema: Swelling limited to foot and ankle, pitting edema < 1/4 inch (6 mm) deep, rest and elevation eliminate most or all swelling.   - MODERATE edema: Swelling of lower leg to knee, pitting edema > 1/4 inch (6 mm) deep, rest and elevation only partially reduce swelling.   - SEVERE edema: Swelling extends above knee, facial or hand swelling present.      severe 4. REDNESS: Does the swelling look red or infected?     no 5. PAIN: Is the swelling painful to touch? If Yes, ask: How painful is it?   (Scale 1-10; mild, moderate or severe)     moderate 6. FEVER: Do you have a fever? If Yes, ask: What is it, how was it measured, and when did it start?      no 7. CAUSE: What do you think is causing the leg  swelling?     Moved heavy exercise equipment 8. MEDICAL HISTORY: Do you have a history of blood clots (e.g., DVT), cancer, heart failure, kidney disease, or liver failure?     Kidney disease 9. RECURRENT SYMPTOM: Have you had leg swelling before? If Yes, ask: When was the last time? What happened that time?     Yes, years ago  10. OTHER SYMPTOMS: Do you have any other symptoms? (e.g., chest pain, difficulty breathing)       Calf pain 11. PREGNANCY: Is there any chance you are pregnant? When was your last menstrual period?       na  Protocols used: Leg Swelling and Edema-A-AH

## 2023-07-28 ENCOUNTER — Ambulatory Visit: Payer: Self-pay

## 2023-07-28 NOTE — Telephone Encounter (Signed)
 FYI Only or Action Required?: FYI only for provider.  Patient was last seen in primary care on 06/26/2023 by George Melanie DASEN, NP. Called Nurse Triage reporting Joint Swelling. Symptoms began several days ago. Interventions attempted: Rest, hydration, or home remedies. Symptoms are: gradually worsening.  Triage Disposition: See Physician Within 24 Hours  Patient/caregiver understands and will follow disposition?: Yes, will follow disposition  Copied from CRM [id number]. Topic: Clinical - Red Word Triage >> Jul 28, 2023  9:05 AM Turkey B wrote: [facility] that prompted transfer to Nurse Triage: pt left knee is swollen Reason for Disposition  [1] Redness of the skin AND [2] no fever  Answer Assessment - Initial Assessment Questions 1. LOCATION: Where is the swelling located?  (e.g., left, right, both knees)    R knee swelling 2. ONSET: When did the swelling start? Does it come and go, or is it there all the time?     Intermittent, ongoing for years 3. SWELLING: How bad is the swelling? Or, How large is it? (e.g., mild, moderate, severe; size of localized swelling)    - NONE: No joint swelling.   - LOCALIZED: Localized; small area of puffy or swollen skin (e.g., insect bite, skin irritation).   - MILD: Joint looks or feels mildly swollen or puffy.   - MODERATE: Swollen; interferes with normal activities (e.g., work or school); can't move joint normally (bend and straighten completely); may be limping.   - SEVERE: Very swollen; can't move swollen joint at all; limping a lot or unable to walk.     Mild localized 4. PAIN: Is there any pain? If Yes, ask: How bad is it? (Scale 1-10; or mild, moderate, severe)   - NONE (0): no pain.   - MILD (1-3): doesn't interfere with normal activities.    - MODERATE (4-7): interferes with normal activities (e.g., work or school) or awakens from sleep, limping.    - SEVERE (8-10): excruciating pain, unable to do any normal activities, unable to  walk.      6 5. SETTING: Has there been any recent work, exercise or other activity that involved that part of the body?      Ate pasta this weekend, and I have gout 6. AGGRAVATING FACTORS: What makes the knee swelling worse? (e.g., walking, climbing stairs, running)     Walking,  7. ASSOCIATED SYMPTOMS: Is there any pain or redness?     Redness, pain 8. OTHER SYMPTOMS: Do you have any other symptoms? (e.g., chest pain, difficulty breathing, fever, calf pain)     Denies  Pt was offered appt today, declined.  Protocols used: Knee Swelling-A-AH

## 2023-07-29 ENCOUNTER — Encounter: Payer: Self-pay | Admitting: Nurse Practitioner

## 2023-07-29 ENCOUNTER — Ambulatory Visit (INDEPENDENT_AMBULATORY_CARE_PROVIDER_SITE_OTHER): Admitting: Nurse Practitioner

## 2023-07-29 VITALS — BP 153/80 | HR 63 | Temp 98.3°F | Ht 70.0 in | Wt 207.0 lb

## 2023-07-29 DIAGNOSIS — M109 Gout, unspecified: Secondary | ICD-10-CM

## 2023-07-29 MED ORDER — METHYLPREDNISOLONE 4 MG PO TBPK: ORAL_TABLET | ORAL | 0 refills | Status: DC

## 2023-07-29 MED ORDER — COLCHICINE 0.6 MG PO TABS: 0.6000 mg | ORAL_TABLET | Freq: Two times a day (BID) | ORAL | 0 refills | Status: AC

## 2023-07-29 NOTE — Progress Notes (Signed)
 BP (!) 153/80 (BP Location: Left Arm, Patient Position: Sitting, Cuff Size: Normal)   Pulse 63   Temp 98.3 F (36.8 C)   Ht 5' 10 (1.778 m)   Wt 207 lb (93.9 kg)   SpO2 97%   BMI 29.70 kg/m    Subjective:    Patient ID: George Gomez, male    DOB: Sep 25, 1960, 63 y.o.   MRN: [id number]  HPI: Craig Ionescu is a 63 y.o. male  Chief Complaint  Patient presents with   Knee Pain    R knee is swollen, painful radiated up the hip, weakness-1 week. Tried gabapentin .   KNEE PAIN Duration: 1 week Involved knee: right Mechanism of injury: unknown Location:diffuse Onset: sudden Severity: 8/10  Quality:  sharp and aching Frequency: constant Radiation: no Aggravating factors: walking  Alleviating factors: rest  Status: worse Treatments attempted: none  Relief with NSAIDs?:  No NSAIDs Taken Weakness with weight bearing or walking: no Sensation of giving way: no Locking: yes Popping: yes Bruising: no Swelling: yes Redness: no Paresthesias/decreased sensation: no Fevers: no  Relevant past medical, surgical, family and social history reviewed and updated as indicated. Interim medical history since our last visit reviewed. Allergies and medications reviewed and updated.  Review of Systems  Musculoskeletal:        Right knee pain and swelling    Per HPI unless specifically indicated above     Objective:    BP (!) 153/80 (BP Location: Left Arm, Patient Position: Sitting, Cuff Size: Normal)   Pulse 63   Temp 98.3 F (36.8 C)   Ht 5' 10 (1.778 m)   Wt 207 lb (93.9 kg)   SpO2 97%   BMI 29.70 kg/m   Wt Readings from Last 3 Encounters:  07/29/23 207 lb (93.9 kg)  06/26/23 200 lb 9.6 oz (91 kg)  05/18/23 210 lb (95.3 kg)    Physical Exam Vitals and nursing note reviewed.  Constitutional:      General: He is not in acute distress.    Appearance: Normal appearance. He is not ill-appearing, toxic-appearing or diaphoretic.  HENT:     Head: Normocephalic.      Right Ear: External ear normal.     Left Ear: External ear normal.     Nose: Nose normal. No congestion or rhinorrhea.     Mouth/Throat:     Mouth: Mucous membranes are moist.  Eyes:     General:        Right eye: No discharge.        Left eye: No discharge.     Extraocular Movements: Extraocular movements intact.     Conjunctiva/sclera: Conjunctivae normal.     Pupils: Pupils are equal, round, and reactive to light.  Cardiovascular:     Rate and Rhythm: Normal rate and regular rhythm.     Heart sounds: No murmur heard. Pulmonary:     Effort: Pulmonary effort is normal. No respiratory distress.     Breath sounds: Normal breath sounds. No wheezing, rhonchi or rales.  Abdominal:     General: Abdomen is flat. Bowel sounds are normal.  Musculoskeletal:     Cervical back: Normal range of motion and neck supple.     Right knee: Swelling and erythema present. Decreased range of motion. Tenderness present.  Skin:    General: Skin is warm and dry.     Capillary Refill: Capillary refill takes less than 2 seconds.  Neurological:     General: No focal deficit present.  Mental Status: He is alert and oriented to person, place, and time.  Psychiatric:        Mood and Affect: Mood normal.        Behavior: Behavior normal.        Thought Content: Thought content normal.        Judgment: Judgment normal.     Results for orders placed or performed in visit on 06/26/23  Bayer DCA Hb A1c Waived   Collection Time: 06/26/23  9:10 AM  Result Value Ref Range   HB A1C (BAYER DCA - WAIVED) 5.5 4.8 - 5.6 %  Basic metabolic panel with GFR   Collection Time: 06/26/23  9:10 AM  Result Value Ref Range   Glucose 105 (H) 70 - 99 mg/dL   BUN 12 8 - 27 mg/dL   Creatinine, Ser 9.02 0.76 - 1.27 mg/dL   eGFR 87 >40 fO/fpw/8.26   BUN/Creatinine Ratio 12 10 - 24   Sodium 141 134 - 144 mmol/L   Potassium 3.9 3.5 - 5.2 mmol/L   Chloride 104 96 - 106 mmol/L   CO2 20 20 - 29 mmol/L   Calcium  8.1 (L) 8.6  - 10.2 mg/dL  Microalbumin, Urine Waived   Collection Time: 06/26/23  9:10 AM  Result Value Ref Range   Microalb, Ur Waived 80 (H) 0 - 19 mg/L   Creatinine, Urine Waived 300 10 - 300 mg/dL   Microalb/Creat Ratio 30-300 (H) <30 mg/g  Lipid Panel w/o Chol/HDL Ratio   Collection Time: 06/26/23  9:10 AM  Result Value Ref Range   Cholesterol, Total 153 100 - 199 mg/dL   Triglycerides 863 0 - 149 mg/dL   HDL 64 >60 mg/dL   VLDL Cholesterol Cal 23 5 - 40 mg/dL   LDL Chol Calc (NIH) 66 0 - 99 mg/dL  Magnesium    Collection Time: 06/26/23  9:10 AM  Result Value Ref Range   Magnesium  1.0 (L) 1.6 - 2.3 mg/dL      Assessment & Plan:   Problem List Items Addressed This Visit   None Visit Diagnoses       Acute gout of right knee, unspecified cause    -  Primary   Will treat with Cholchicine and Medrol  dose pak.  Complete course of mediations.  Follow up if not improved.   Relevant Medications   methylPREDNISolone  (MEDROL  DOSEPAK) 4 MG TBPK tablet   colchicine 0.6 MG tablet        Follow up plan: No follow-ups on file.

## 2023-08-03 ENCOUNTER — Telehealth: Payer: Self-pay

## 2023-08-17 ENCOUNTER — Encounter: Payer: Self-pay | Admitting: Hematology and Oncology

## 2023-08-18 ENCOUNTER — Encounter: Payer: Self-pay | Admitting: Pediatrics

## 2023-08-18 ENCOUNTER — Ambulatory Visit (INDEPENDENT_AMBULATORY_CARE_PROVIDER_SITE_OTHER): Admitting: Pediatrics

## 2023-08-18 ENCOUNTER — Inpatient Hospital Stay: Attending: Oncology

## 2023-08-18 VITALS — BP 146/77 | HR 65 | Temp 98.3°F | Wt 208.2 lb

## 2023-08-18 DIAGNOSIS — M109 Gout, unspecified: Secondary | ICD-10-CM

## 2023-08-18 DIAGNOSIS — D509 Iron deficiency anemia, unspecified: Secondary | ICD-10-CM | POA: Insufficient documentation

## 2023-08-18 DIAGNOSIS — D643 Other sideroblastic anemias: Secondary | ICD-10-CM

## 2023-08-18 LAB — CMP (CANCER CENTER ONLY)
ALT: 36 U/L (ref 0–44)
AST: 72 U/L — ABNORMAL HIGH (ref 15–41)
Albumin: 3.8 g/dL (ref 3.5–5.0)
Alkaline Phosphatase: 125 U/L (ref 38–126)
Anion gap: 9 (ref 5–15)
BUN: 14 mg/dL (ref 8–23)
CO2: 22 mmol/L (ref 22–32)
Calcium: 8.4 mg/dL — ABNORMAL LOW (ref 8.9–10.3)
Chloride: 106 mmol/L (ref 98–111)
Creatinine: 0.99 mg/dL (ref 0.61–1.24)
GFR, Estimated: >60 mL/min (ref >60)
Glucose, Bld: 122 mg/dL — ABNORMAL HIGH (ref 70–99)
Potassium: 3.3 mmol/L — ABNORMAL LOW (ref 3.5–5.1)
Sodium: 137 mmol/L (ref 135–145)
Total Bilirubin: 0.6 mg/dL (ref 0.0–1.2)
Total Protein: 7 g/dL (ref 6.5–8.1)

## 2023-08-18 LAB — CBC WITH DIFFERENTIAL (CANCER CENTER ONLY)
Abs Immature Granulocytes: 0.02 K/uL (ref 0.00–0.07)
Basophils Absolute: 0 K/uL (ref 0.0–0.1)
Basophils Relative: 1 %
Eosinophils Absolute: 0.1 K/uL (ref 0.0–0.5)
Eosinophils Relative: 3 %
HCT: 31.4 % — ABNORMAL LOW (ref 39.0–52.0)
Hemoglobin: 10 g/dL — ABNORMAL LOW (ref 13.0–17.0)
Immature Granulocytes: 1 %
Lymphocytes Relative: 23 %
Lymphs Abs: 0.8 K/uL (ref 0.7–4.0)
MCH: 32.3 pg (ref 26.0–34.0)
MCHC: 31.8 g/dL (ref 30.0–36.0)
MCV: 101.3 fL — ABNORMAL HIGH (ref 80.0–100.0)
Monocytes Absolute: 0.4 K/uL (ref 0.1–1.0)
Monocytes Relative: 10 %
Neutro Abs: 2.1 K/uL (ref 1.7–7.7)
Neutrophils Relative %: 62 %
Platelet Count: 142 K/uL — ABNORMAL LOW (ref 150–400)
RBC: 3.1 MIL/uL — ABNORMAL LOW (ref 4.22–5.81)
RDW: 15.4 % (ref 11.5–15.5)
WBC Count: 3.4 K/uL — ABNORMAL LOW (ref 4.0–10.5)
nRBC: 0 % (ref 0.0–0.2)

## 2023-08-18 MED ORDER — PREDNISONE 20 MG PO TABS: 40.0000 mg | ORAL_TABLET | Freq: Every day | ORAL | 0 refills | Status: AC

## 2023-08-18 NOTE — Patient Instructions (Addendum)
Steroids sent

## 2023-08-18 NOTE — Progress Notes (Signed)
 Office Visit  BP (!) 146/77   Pulse 65   Temp 98.3 F (36.8 C) (Oral)   Wt 208 lb 3.2 oz (94.4 kg)   SpO2 98%   BMI 29.87 kg/m    Subjective:    Patient ID: Kristoffer Bala, male    DOB: [birth year], 63 y.o.   MRN: [id number]  HPI: Clois Treanor is a 63 y.o. male  Chief Complaint  Patient presents with   Knee Pain    Discussed the use of AI scribe software for clinical note transcription with the patient, who gave verbal consent to proceed.  History of Present Illness   Suyash Amory is a 63 year old male with gout who presents with knee pain and swelling.  He experiences significant knee pain and swelling, which he attributes to a gout flare. The swelling and tenderness extend up to his hip. He has not had a gout flare in years but recognizes the symptoms from previous episodes.  He is taking a Medrol  pack, a low-dose steroid, with minimal improvement. He is also on allopurinol  at the lowest dose for gout management. He mentions that his gout is the reason for his disability.  During the review of symptoms, he confirms knee pain and swelling. He is concerned about fluid accumulation but notes that the swelling feels hard rather than fluid-like.      Relevant past medical, surgical, family and social history reviewed and updated as indicated. Interim medical history since our last visit reviewed. Allergies and medications reviewed and updated.  ROS per HPI unless specifically indicated above     Objective:    BP (!) 146/77   Pulse 65   Temp 98.3 F (36.8 C) (Oral)   Wt 208 lb 3.2 oz (94.4 kg)   SpO2 98%   BMI 29.87 kg/m   Wt Readings from Last 3 Encounters:  08/18/23 208 lb 3.2 oz (94.4 kg)  07/29/23 207 lb (93.9 kg)  06/26/23 200 lb 9.6 oz (91 kg)     Physical Exam Constitutional:      Appearance: Normal appearance.  Pulmonary:     Effort: Pulmonary effort is normal.  Musculoskeletal:        General: Swelling present. Normal range of motion.      Comments: Right knee swelling, slight warmth  Skin:    Comments: Normal skin color  Neurological:     General: No focal deficit present.     Mental Status: He is alert. Mental status is at baseline.  Psychiatric:        Mood and Affect: Mood normal.        Behavior: Behavior normal.        Thought Content: Thought content normal.         08/18/2023    2:54 PM 07/29/2023    2:02 PM 06/26/2023    9:10 AM 05/14/2023   12:31 PM 05/14/2023   10:29 AM  Depression screen PHQ 2/9  Decreased Interest 1 1 1  0 0  Down, Depressed, Hopeless 0 0 0 0 0  PHQ - 2 Score 1 1 1  0 0  Altered sleeping 1 2 1     Tired, decreased energy 3 2 2     Change in appetite 1 1 1     Feeling bad or failure about yourself  0 0 0    Trouble concentrating 0 0 0    Moving slowly or fidgety/restless 0 0 2    Suicidal thoughts 0 0 0    PHQ-9  Score 6 6 7     Difficult doing work/chores Somewhat difficult Somewhat difficult Not difficult at all         08/18/2023    2:54 PM 07/29/2023    2:02 PM 06/26/2023    9:10 AM 05/14/2023   12:31 PM  GAD 7 : Generalized Anxiety Score  Nervous, Anxious, on Edge 2 1 1  0  Control/stop worrying 0 0 0 0  Worry too much - different things 0 0 0 0  Trouble relaxing 0 0 0 1  Restless 0 0 0 0  Easily annoyed or irritable 2 1 2  0  Afraid - awful might happen 0 0 0 0  Total GAD 7 Score 4 2 3 1   Anxiety Difficulty Somewhat difficult Somewhat difficult Not difficult at all Somewhat difficult       Assessment & Plan:  Assessment & Plan   Acute gout of right knee, unspecified cause Chronic gout with recent knee and possible hip flare-up. Current Medrol  pack is insufficient, and symptoms suggest gout rather than infection. Prescribe prednisone  40 mg daily for 7 days. Consider imaging of knee and hip if no improvement. Increase allopurinol  to 450 mg next week if symptoms persist. Follow-up in one week to reassess. -     predniSONE ; Take 2 tablets (40 mg total) by mouth daily with breakfast  for 7 days.  Dispense: 14 tablet; Refill: 0   Follow up plan: Return in about 1 week (around 08/25/2023).  Hadassah SHAUNNA Nett, MD

## 2023-08-19 LAB — COPPER, SERUM: Copper: 104 ug/dL (ref 69–132)

## 2023-08-22 LAB — VITAMIN B1: Vitamin B1 (Thiamine): 91.6 nmol/L (ref 66.5–200.0)

## 2023-08-23 ENCOUNTER — Encounter: Payer: Self-pay | Admitting: Pediatrics

## 2023-08-24 ENCOUNTER — Telehealth: Payer: Self-pay

## 2023-08-25 ENCOUNTER — Ambulatory Visit: Admitting: Pediatrics

## 2023-08-28 ENCOUNTER — Ambulatory Visit: Admitting: Nurse Practitioner

## 2023-08-28 ENCOUNTER — Other Ambulatory Visit: Payer: Self-pay

## 2023-08-28 DIAGNOSIS — D643 Other sideroblastic anemias: Secondary | ICD-10-CM

## 2023-08-31 ENCOUNTER — Inpatient Hospital Stay: Admitting: Nurse Practitioner

## 2023-08-31 ENCOUNTER — Ambulatory Visit

## 2023-09-10 ENCOUNTER — Inpatient Hospital Stay: Attending: Oncology

## 2023-09-10 ENCOUNTER — Encounter: Payer: Self-pay | Admitting: Nurse Practitioner

## 2023-09-10 ENCOUNTER — Inpatient Hospital Stay (HOSPITAL_BASED_OUTPATIENT_CLINIC_OR_DEPARTMENT_OTHER): Admitting: Nurse Practitioner

## 2023-09-10 VITALS — BP 153/86 | HR 63 | Temp 98.6°F | Resp 20 | Wt 211.2 lb

## 2023-09-10 DIAGNOSIS — D538 Other specified nutritional anemias: Secondary | ICD-10-CM

## 2023-09-10 DIAGNOSIS — D509 Iron deficiency anemia, unspecified: Secondary | ICD-10-CM | POA: Diagnosis present

## 2023-09-10 DIAGNOSIS — D643 Other sideroblastic anemias: Secondary | ICD-10-CM | POA: Diagnosis not present

## 2023-09-10 LAB — CBC WITH DIFFERENTIAL (CANCER CENTER ONLY)
Abs Immature Granulocytes: 0.08 K/uL — ABNORMAL HIGH (ref 0.00–0.07)
Basophils Absolute: 0 K/uL (ref 0.0–0.1)
Basophils Relative: 0 %
Eosinophils Absolute: 0 K/uL (ref 0.0–0.5)
Eosinophils Relative: 0 %
HCT: 33 % — ABNORMAL LOW (ref 39.0–52.0)
Hemoglobin: 10.5 g/dL — ABNORMAL LOW (ref 13.0–17.0)
Immature Granulocytes: 1 %
Lymphocytes Relative: 14 %
Lymphs Abs: 0.9 K/uL (ref 0.7–4.0)
MCH: 31.7 pg (ref 26.0–34.0)
MCHC: 31.8 g/dL (ref 30.0–36.0)
MCV: 99.7 fL (ref 80.0–100.0)
Monocytes Absolute: 1 K/uL (ref 0.1–1.0)
Monocytes Relative: 15 %
Neutro Abs: 4.4 K/uL (ref 1.7–7.7)
Neutrophils Relative %: 70 %
Platelet Count: 160 K/uL (ref 150–400)
RBC: 3.31 MIL/uL — ABNORMAL LOW (ref 4.22–5.81)
RDW: 14.6 % (ref 11.5–15.5)
WBC Count: 6.3 K/uL (ref 4.0–10.5)
nRBC: 0 % (ref 0.0–0.2)

## 2023-09-10 LAB — CMP (CANCER CENTER ONLY)
ALT: 29 U/L (ref 0–44)
AST: 44 U/L — ABNORMAL HIGH (ref 15–41)
Albumin: 3.8 g/dL (ref 3.5–5.0)
Alkaline Phosphatase: 97 U/L (ref 38–126)
Anion gap: 10 (ref 5–15)
BUN: 18 mg/dL (ref 8–23)
CO2: 24 mmol/L (ref 22–32)
Calcium: 9 mg/dL (ref 8.9–10.3)
Chloride: 99 mmol/L (ref 98–111)
Creatinine: 1.05 mg/dL (ref 0.61–1.24)
GFR, Estimated: >60 mL/min (ref >60)
Glucose, Bld: 153 mg/dL — ABNORMAL HIGH (ref 70–99)
Potassium: 3.9 mmol/L (ref 3.5–5.1)
Sodium: 133 mmol/L — ABNORMAL LOW (ref 135–145)
Total Bilirubin: 0.5 mg/dL (ref 0.0–1.2)
Total Protein: 7.1 g/dL (ref 6.5–8.1)

## 2023-09-10 NOTE — Progress Notes (Signed)
 Hematology/Oncology Consult Note [facility]  [phone] [phone]  Patient Care Team: Cannady, Jolene T, NP as PCP - General (Nurse Practitioner) Unk Corinn Skiff, MD as Consulting Physician (Gastroenterology) Melanee Annah BROCKS, MD as Consulting Physician (Oncology) Onita Elspeth Sharper, DO as Consulting Physician (Gastroenterology) Ermalinda Lenn HERO, LCSW as [facility] Karoline Lima, RN as [facility], [place], [place]   Name of the patient: George Gomez  [id number]  [birth year]   Date of visit: 09/10/23  Diagnosis-  sideroblastic anemia d/t alcohol  use versus MDS  Chief complaint/ Reason for visit- routine follow up  Heme/Onc history:  Patient is a 63 year old African-American male who is transferring his care from Dr. Rudell to me.He has been followed for his pancytopenia but mainly anemia.  Hemoglobin has remained around 10 for the last 3 years.  He also has mild intermittent leukopenia with a white count that fluctuates between 3.3-4 mainly neutropenia.  Platelet counts have been mostly normal except for intermittent mild low platelet counts between 120s to 140s.  He has prior history of B12 deficiency and he gets monthly B12 injections.  History of folate deficiency but of late that has been normal.  He had a flow cytometry in December 2020 that was normal.  Anemia work-up including TSH copper  zinc  ANA hepatitis testing and HIV testing was negative.Patient has had an elevated ferritin which has been again a longstanding issue and his ferritin levels fluctuate between [id number].  He had HFE gene testing done in the past which was negative.LFTs have shown mildly elevated AST and ALT intermittently but no hyperbilirubinemia.MR eneterogram in October 2021 showed normal liver size and configuration.  No splenomegaly.    BM biopsy on 4/21 showed : While the aspirate smear slides are somewhat suboptimal given limited  spicules, the red blood cell morphology shows a prominent erythroid hyperplasia with left shift with vascularization and prominent basophilic stippling with siderotic granules.  The siderotic  granules and basophilic stippling are also prominently noted in the peripheral blood.  While there is mild nuclear irregularity in the later nucleated red blood cell precursors this finding is not prominent.  There is a subset of red blood cell vacuolization the iron  stain shows prominent ring sideroblasts the overall marrow cellularity is normocellular to mildly hypercellular, predominantly secondary to this relative to absolute erythroid hyperplasia with left shift.  The myeloid series appears to show a relative at to absolute hypoplasia but with only borderline leukopenia/neutropenia in the peripheral blood.  While the megakaryocytes are limited on aspirate they are increased on the biopsy and show a subset of forms that show separated nuclei (i.e. palm balls) as well as some abnormal lobulation suggestive of megakaryocytic dyspoiesis.  In addition, there are large/giant platelets in the peripheral blood.   The above findings are consistent with a sideroblastic anemia.  While definitive dysplasia is not identified for definitive diagnosis of a myelodysplastic syndrome, it is suggested in the megakaryocytic lineage as well as the erythroid lineage with the ring sideroblasts; however, while the ring sideroblasts as well as the presence of  vacuolization suggests possible red blood cell dysplasia these findings can also be seen in toxicities as well as in defective heme biosynthesis/mitochondrial dysfunction; however most of these forms are congenital and are identified by at least early adulthood.  A myelodysplastic syndrome therefore is the most likely culprit   Interval history- Patient is 63 year old male whor eturns to clinic for follow up of his anemia.  He continues to consume alcohol  but has cut back. Has ongoing  fatigue which is unchanged and stable.   ECOG PS- 1 Pain scale- 0  Review of systems- Review of Systems  Constitutional:  Positive for malaise/fatigue. Negative for chills, fever and weight loss.  HENT:  Negative for congestion, ear discharge and nosebleeds.   Eyes:  Negative for blurred vision.  Respiratory:  Negative for cough, hemoptysis, sputum production, shortness of breath and wheezing.   Cardiovascular:  Negative for chest pain, palpitations, orthopnea and claudication.  Gastrointestinal:  Negative for abdominal pain, blood in stool, constipation, diarrhea, heartburn, melena, nausea and vomiting.  Genitourinary:  Negative for dysuria, flank pain, frequency, hematuria and urgency.  Musculoskeletal:  Negative for back pain, joint pain and myalgias.  Skin:  Negative for rash.  Neurological:  Negative for dizziness, tingling, focal weakness, seizures, weakness and headaches.  Endo/Heme/Allergies:  Does not bruise/bleed easily.  Psychiatric/Behavioral:  Negative for depression and suicidal ideas. The patient does not have insomnia.     Allergies  Allergen Reactions   Lisinopril  Swelling   Losartan  Swelling   Past Medical History:  Diagnosis Date   Acute on chronic renal failure (HCC) 03/20/2016   Acute renal failure (ARF) (HCC) 08/28/2016   Acute renal failure (HCC) 10/12/2018   Anemia    B12 deficiency 12/11/2017   Clostridium difficile diarrhea 03/18/2016   Diabetes mellitus without complication (HCC)    Diverticulitis    Pt states diverticulitis   EtOH dependence (HCC)    Folate deficiency 07/25/2018   GI bleed 05/30/2017   Hypercholesteremia    Hypertension    Melena 10/21/2018   Weight loss 09/15/2018   Past Surgical History:  Procedure Laterality Date   COLONOSCOPY WITH PROPOFOL  N/A 10/16/2018   Procedure: COLONOSCOPY WITH PROPOFOL ;  Surgeon: Unk Corinn Skiff, MD;  Location: [facility] ENDOSCOPY;  Service: Gastroenterology;  Laterality: N/A;   ENTEROSCOPY N/A 10/16/2018    Procedure: ENTEROSCOPY;  Surgeon: Unk Corinn Skiff, MD;  Location: [facility] ENDOSCOPY;  Service: Gastroenterology;  Laterality: N/A;   ESOPHAGOGASTRODUODENOSCOPY N/A 07/02/2018   Procedure: ESOPHAGOGASTRODUODENOSCOPY (EGD);  Surgeon: Unk Corinn Skiff, MD;  Location[facility] ENDOSCOPY;  Service: Gastroenterology;  Laterality: N/A;   ESOPHAGOGASTRODUODENOSCOPY (EGD) WITH PROPOFOL  N/A 06/01/2017   Procedure: ESOPHAGOGASTRODUODENOSCOPY (EGD) WITH PROPOFOL ;  Surgeon: Therisa Bi, MD;  Location: [facility] ENDOSCOPY;  Service: Gastroenterology;  Laterality: N/A;   GIVENS CAPSULE STUDY N/A 10/17/2018   Procedure: GIVENS CAPSULE STUDY;  Surgeon: Unk Corinn Skiff, MD;  Location: [facility] ENDOSCOPY;  Service: Gastroenterology;  Laterality: N/A;   GIVENS CAPSULE STUDY N/A 08/02/2019   Procedure: GIVENS CAPSULE STUDY;  Surgeon: Unk Corinn Skiff, MD;  Location: [facility] ENDOSCOPY;  Service: Gastroenterology;  Laterality: N/A;   IR BONE MARROW BIOPSY & ASPIRATION  05/18/2023   none     Social History   Socioeconomic History   Marital status: Divorced    Spouse name: Not on file   Number of children: 0   Years of education: Not on file   Highest education level: Not on file  Occupational History   Occupation: on disability  Tobacco Use   Smoking status: Never   Smokeless tobacco: Never  Vaping Use   Vaping status: Never Used  Substance and Sexual Activity   Alcohol  use: Yes    Alcohol /week: 21.0 standard drinks of alcohol     Types: 21 Shots of liquor per week    Comment: 3 shots of liquor daily   Drug use: No   Sexual activity: Yes  Other  Topics Concern   Not on file  Social History Narrative   Not on file   Social Drivers of Health   Financial Resource Strain: Low Risk  (05/14/2023)   Overall Financial Resource Strain (CARDIA)    Difficulty of Paying Living Expenses: Not very hard  Food Insecurity: No Food Insecurity (05/14/2023)   Hunger Vital Sign    Worried About Running Out of Food in the Last Year:  Never true    Ran Out of Food in the Last Year: Never true  Transportation Needs: No Transportation Needs (05/14/2023)   PRAPARE - Administrator, Civil Service (Medical): No    Lack of Transportation (Non-Medical): No  Physical Activity: Inactive (05/14/2023)   Exercise Vital Sign    Days of Exercise per Week: 0 days    Minutes of Exercise per Session: 0 min  Stress: No Stress Concern Present (05/14/2023)   [employer] of Occupational Health - Occupational Stress Questionnaire    Feeling of Stress : Not at all  Social Connections: Socially Isolated (05/14/2023)   Social Connection and Isolation Panel    Frequency of Communication with Friends and Family: More than three times a week    Frequency of Social Gatherings with Friends and Family: More than three times a week    Attends Religious Services: Never    Database administrator or Organizations: No    Attends Banker Meetings: Never    Marital Status: Divorced  Catering manager Violence: Not At Risk (05/14/2023)   Humiliation, Afraid, Rape, and Kick questionnaire    Fear of Current or Ex-Partner: No    Emotionally Abused: No    Physically Abused: No    Sexually Abused: No   Family History  Problem Relation Age of Onset   Rheum arthritis Mother    Diabetes Father    Heart disease Father    Cancer Father    COPD Sister    Rheum arthritis Sister    Diabetes Maternal Grandmother    Cancer Maternal Grandfather    Diabetes Paternal Grandmother    Cancer Paternal Grandfather    Current Outpatient Medications:    allopurinol  (ZYLOPRIM ) 300 MG tablet, Take 1 tablet (300 mg total) by mouth daily., Disp: 90 tablet, Rfl: 4   atorvastatin  (LIPITOR) 40 MG tablet, Take 1 tablet (40 mg total) by mouth daily., Disp: 90 tablet, Rfl: 4   Blood Glucose Monitoring Suppl (ONETOUCH VERIO) w/Device KIT, Use to check blood sugar 3 times a day and document results, bring to appointments.  Goal is <130 fasting blood  sugar and <180 two hours after meals., Disp: 1 kit, Rfl: 0   Cholecalciferol 50 MCG (2000 UT) CAPS, Take 1 capsule by mouth daily., Disp: , Rfl:    cyanocobalamin  (VITAMIN B12) 1000 MCG tablet, Take 1 tablet (1,000 mcg total) by mouth daily., Disp: 90 tablet, Rfl: 4   gabapentin  (NEURONTIN ) 300 MG capsule, Take 1 capsule (300 mg total) by mouth every morning AND 1 capsule (300 mg total) daily at 12 noon AND 2 capsules (600 mg total) at bedtime., Disp: 360 capsule, Rfl: 4   glucose blood (ONETOUCH VERIO) test strip, Use to check blood sugar 3 times a day and document results, bring to appointments.  Goal is <130 fasting blood sugar and <180 two hours after meals., Disp: 100 each, Rfl: 12   Lancets (ONETOUCH ULTRASOFT) lancets, Use to check blood sugar 3 times a day and document results, bring to appointments.  Goal  is <130 fasting blood sugar and <180 two hours after meals., Disp: 100 each, Rfl: 12   magnesium  oxide (MAG-OX) 400 (240 Mg) MG tablet, Take 1 tablet (400 mg total) by mouth 2 (two) times daily., Disp: 180 tablet, Rfl: 4   ondansetron  (ZOFRAN ) 4 MG tablet, Take 1 tablet (4 mg total) by mouth every 8 (eight) hours as needed., Disp: 15 tablet, Rfl: 0   pantoprazole  (PROTONIX ) 40 MG tablet, Take 1 tablet (40 mg total) by mouth daily., Disp: 90 tablet, Rfl: 4   propranolol  ER (INDERAL  LA) 60 MG 24 hr capsule, Take 1 capsule (60 mg total) by mouth daily., Disp: 90 capsule, Rfl: 4   tiZANidine  (ZANAFLEX ) 2 MG tablet, TAKE 1 TABLET(2 MG) BY MOUTH EVERY 8 HOURS AS NEEDED FOR MUSCLE SPASMS, Disp: 30 tablet, Rfl: 0   triamcinolone  cream (KENALOG ) 0.1 %, Apply 1 application topically 2 (two) times daily., Disp: 453.6 g, Rfl: 0   VOLTAREN 1 % GEL, , Disp: , Rfl:    colchicine  0.6 MG tablet, Take 1 tablet (0.6 mg total) by mouth 2 (two) times daily. (Patient not taking: Reported on 09/10/2023), Disp: 20 tablet, Rfl: 0  Physical exam:  Vitals:   09/10/23 1028  BP: (!) 153/86  Pulse: 63  Resp: 20   Temp: 98.6 F (37 C)  SpO2: 100%  Weight: 211 lb 3.2 oz (95.8 kg)   Physical Exam Vitals reviewed.  Constitutional:      Appearance: He is not ill-appearing.  Cardiovascular:     Rate and Rhythm: Normal rate and regular rhythm.  Pulmonary:     Effort: Pulmonary effort is normal.     Breath sounds: Normal breath sounds.  Abdominal:     General: There is no distension.     Palpations: Abdomen is soft.  Skin:    General: Skin is warm and dry.  Neurological:     Mental Status: He is alert and oriented to person, place, and time.  Psychiatric:        Mood and Affect: Mood normal.        Behavior: Behavior normal.     I have personally reviewed labs listed below:    Latest Ref Rng & Units 09/10/2023   10:13 AM  CMP  Glucose 70 - 99 mg/dL 846   BUN 8 - 23 mg/dL 18   Creatinine 9.38 - 1.24 mg/dL 8.94   Sodium 864 - 854 mmol/L 133   Potassium 3.5 - 5.1 mmol/L 3.9   Chloride 98 - 111 mmol/L 99   CO2 22 - 32 mmol/L 24   Calcium  8.9 - 10.3 mg/dL 9.0   Total Protein 6.5 - 8.1 g/dL 7.1   Total Bilirubin 0.0 - 1.2 mg/dL 0.5   Alkaline Phos 38 - 126 U/L 97   AST 15 - 41 U/L 44   ALT 0 - 44 U/L 29       Latest Ref Rng & Units 09/10/2023   10:13 AM  CBC  WBC 4.0 - 10.5 K/uL 6.3   Hemoglobin 13.0 - 17.0 g/dL 89.4   Hematocrit 60.9 - 52.0 % 33.0   Platelets 150 - 400 K/uL 160    Component Ref Range & Units (hover) 1 mo ago 2 mo ago 5 yr ago  Copper  67 Low  104 CM 132 R   Component Ref Range & Units (hover) 1 mo ago 2 mo ago  Vitamin B1 (Thiamine ) 94.3 91.6 CM    Assessment and plan- Patient is a  63 y.o. male with history of chronic normal anemia here to discuss further management   Anemia- Baseline hemoglobin around 10 for past 4 years but since January 2025, hemoglobin had drifted down to 8.5-9.5 range. Peripheral blood anemia workup did not reveal obvious etiology and therefore he underwent bone marrow biopsy. Red cell morphology with prominent erythroid hyperplasia  with left-shifted and vascularization and basophilic stippling with fibrotic granules.  Subset of red blood cell vacuolization iron  stain shows prominent ring sideroblasts.  Overall findings were consistent with sideroblastic anemia which can be seen in toxicities as well as possible MDS.  Cytogenetics were normal and [employer] showed CBL mutations but no alteration in SF 3 B1, CALR flit 3 IDH 1 2, JAK2, MPL and NPM1 or T p53. Given his overall clinical picture it was felt that sideroblastic anemia could be related to his alcohol  use. We had recommended cutting back and assessing if he had improvement. He has been successful in cutting back on alcohol  and hemoglobin has improved to 10.5. Given stability and improvement to his counts we'll hold off on trial of any EPO today.  Copper  Deficiency- question if secondary to malnutrition vs malabsorption vs liver dysfunction vs others. Recommend starting oral copper  2 mg daily.   Disposition:  No epo today 2 mo- lab (cbc, cmp, b1, b12, copper ) Dr Melanee- la   Visit Diagnosis 1. Sideroblastic anemia (HCC)   2. Anemia due to copper  deficiency    Tinnie Dawn, DNP, AGNP-C, [facility], [facility], [facility] [phone] (clinic) 09/10/2023  CC: Dr Melanee

## 2023-09-11 LAB — COPPER, SERUM: Copper: 67 ug/dL — ABNORMAL LOW (ref 69–132)

## 2023-09-13 LAB — VITAMIN B1: Vitamin B1 (Thiamine): 94.3 nmol/L (ref 66.5–200.0)

## 2023-09-15 ENCOUNTER — Encounter: Payer: Self-pay | Admitting: Family Medicine

## 2023-09-15 ENCOUNTER — Ambulatory Visit (INDEPENDENT_AMBULATORY_CARE_PROVIDER_SITE_OTHER): Admitting: Family Medicine

## 2023-09-15 VITALS — BP 136/80 | HR 64 | Ht 70.0 in | Wt 207.0 lb

## 2023-09-15 DIAGNOSIS — M25461 Effusion, right knee: Secondary | ICD-10-CM | POA: Insufficient documentation

## 2023-09-15 NOTE — Progress Notes (Signed)
 Primary Care / Sports Medicine Office Visit  Patient Information:  Patient ID: George Gomez, male DOB: [birth year] Age: 63 y.o. MRN: [id number]   George Gomez is a pleasant 63 y.o. male presenting with the following:  Chief Complaint  Patient presents with   Knee Pain    Right knee pain and swelling x 1 month. Having difficulty walking, going from sit to stand, and standing for long periods. Patient does walk with a cane to get some relief, he has went through 2 rounds of prednisone  which helped some. He had PT last year which did not help with his knee.     Vitals:   09/15/23 1029  BP: 136/80  Pulse: 64  SpO2: 98%   Vitals:   09/15/23 1029  Weight: 207 lb (93.9 kg)  Height: 5' 10 (1.778 m)   Body mass index is 29.7 kg/m.  No results found.   Independent interpretation of notes and tests performed by another provider:   None  Procedures performed:   None  Pertinent History, Exam, Impression, and Recommendations:   Problem List Items Addressed This Visit     Effusion of right knee - Primary   History of Present Illness George Gomez is a 63 year old male with gout who presents with a swollen knee.  Knee swelling and pain - Swelling of the knee for approximately one month, onset after increased physical activity involving lifting and hauling during a pharmacy visit - Persistent swelling since onset - Significant tenderness at both the lateral and medial patella facet - Pain at the lateral joint line - Mild tenderness at the quadriceps tendon - No erythema or ecchymosis present in the knee  Gout flares and dietary triggers - History of gout affecting knees and toes - Daily use of gout medication - Recent consumption of a large amount of shrimp, which he typically avoids, suspected as a trigger for current symptoms  Prior treatments and interventions - Previously received therapy and medication for knee symptoms - Medrol  Pak prescribed on July 22nd for  suspected gout flare, uncertain of response - Familiar with and has used Voltaren gel in the past - History of knee aspiration and intra-articular injections years ago  Physical Exam RIGHT KNEE INSPECTION: Prominent swelling without erythema or ecchymosis PALPATION: Warmth with 3+ effusion; significant tenderness at lateral and medial patellar facets; medial joint line nontender; lateral joint line tender; mild tenderness at quadriceps tendon; minimal tenderness at patellar tendon RANGE OF MOTION: 0-90 degrees, limited by painful anterior flexion STRENGTH: Quadriceps and hamstrings 5/5, limited effort secondary to pain NEUROLOGICAL: Sensation intact in lower extremity, no motor deficits SPECIAL TESTS: Modified McMurray localizes anterolaterally; anterior and posterior drawer tests negative; valgus and varus stress tests negative  Assessment and Plan Right knee effusion with suspected pseudogout vs osteoarthritis flare Right knee effusion with swelling and tenderness at patellar facets, likely due to gout or osteoarthritis flare. Considered renal history and impact of oral steroids on blood sugar. - Schedule knee aspiration and corticosteroid injection for tomorrow. - Send aspirated joint fluid for lab analysis to assess pseudogout. - Advise Voltaren gel four times daily to reduce inflammation pre-procedure.        Orders & Medications Medications: No orders of the defined types were placed in this encounter.  No orders of the defined types were placed in this encounter.    No follow-ups on file.     Arnelle Nale J Cade Dashner, MD, [facility]   Primary Care Sports Medicine Primary  Care and Sports Medicine at [facility]

## 2023-09-15 NOTE — Patient Instructions (Signed)
 Patient Plan for Post-Visit Guidance  - Attend scheduled knee aspiration and corticosteroid injection tomorrow. - Joint fluid will be sent for lab analysis to check for pseudogout. - Apply Voltaren gel to the right knee up to four times daily before the procedure to help reduce inflammation.  Red Flags - If you experience severe knee pain, rapid increase in swelling, redness, warmth, fever, or difficulty moving your leg, seek medical attention promptly.

## 2023-09-15 NOTE — Assessment & Plan Note (Addendum)
 History of Present Illness George Gomez is a 63 year old male with gout who presents with a swollen knee.  Knee swelling and pain - Swelling of the knee for approximately one month, onset after increased physical activity involving lifting and hauling during a pharmacy visit - Persistent swelling since onset - Significant tenderness at both the lateral and medial patella facet - Pain at the lateral joint line - Mild tenderness at the quadriceps tendon - No erythema or ecchymosis present in the knee  Gout flares and dietary triggers - History of gout affecting knees and toes - Daily use of gout medication - Recent consumption of a large amount of shrimp, which he typically avoids, suspected as a trigger for current symptoms  Prior treatments and interventions - Previously received therapy and medication for knee symptoms - Medrol  Pak prescribed on July 22nd for suspected gout flare, uncertain of response - Familiar with and has used Voltaren gel in the past - History of knee aspiration and intra-articular injections years ago  Physical Exam RIGHT KNEE INSPECTION: Prominent swelling without erythema or ecchymosis PALPATION: Warmth with 3+ effusion; significant tenderness at lateral and medial patellar facets; medial joint line nontender; lateral joint line tender; mild tenderness at quadriceps tendon; minimal tenderness at patellar tendon RANGE OF MOTION: 0-90 degrees, limited by painful anterior flexion STRENGTH: Quadriceps and hamstrings 5/5, limited effort secondary to pain NEUROLOGICAL: Sensation intact in lower extremity, no motor deficits SPECIAL TESTS: Modified McMurray localizes anterolaterally; anterior and posterior drawer tests negative; valgus and varus stress tests negative  Assessment and Plan Right knee effusion with suspected pseudogout vs osteoarthritis flare Right knee effusion with swelling and tenderness at patellar facets, likely due to gout or osteoarthritis  flare. Considered renal history and impact of oral steroids on blood sugar. - Schedule knee aspiration and corticosteroid injection for tomorrow. - Send aspirated joint fluid for lab analysis to assess pseudogout. - Advise Voltaren gel four times daily to reduce inflammation pre-procedure.

## 2023-09-16 ENCOUNTER — Other Ambulatory Visit (INDEPENDENT_AMBULATORY_CARE_PROVIDER_SITE_OTHER): Payer: Self-pay | Admitting: Radiology

## 2023-09-16 ENCOUNTER — Encounter: Payer: Self-pay | Admitting: Family Medicine

## 2023-09-16 ENCOUNTER — Ambulatory Visit (INDEPENDENT_AMBULATORY_CARE_PROVIDER_SITE_OTHER): Admitting: Family Medicine

## 2023-09-16 VITALS — BP 138/94 | HR 88 | Ht 70.0 in | Wt 207.0 lb

## 2023-09-16 DIAGNOSIS — M25461 Effusion, right knee: Secondary | ICD-10-CM

## 2023-09-16 MED ORDER — TRIAMCINOLONE ACETONIDE 40 MG/ML IJ SUSP
40.0000 mg | Freq: Once | INTRAMUSCULAR | Status: AC
  Administered 2023-09-16: 40 mg via INTRAMUSCULAR

## 2023-09-16 NOTE — Progress Notes (Signed)
     Primary Care / Sports Medicine Office Visit  Patient Information:  Patient ID: George Gomez, male DOB: 03-12-1960 Age: 63 y.o. MRN: [id number]   George Gomez is a pleasant 63 y.o. male presenting with the following:  Chief Complaint  Patient presents with   Knee Pain    Right knee swelling and pain. Need for aspiration and cortisone injection.    Vitals:   09/16/23 1057  BP: (!) 138/94  Pulse: 88  SpO2: 98%   Vitals:   09/16/23 1057  Weight: 207 lb (93.9 kg)  Height: 5' 10 (1.778 m)   Body mass index is 29.7 kg/m.  No results found.   Independent interpretation of notes and tests performed by another provider:   None  Procedures performed:   Procedure:  Injection following aspiration of right knee under ultrasound guidance. Ultrasound guidance utilized for in-plane lateral approach to the suprapatellar bursa, effusion and synovial thickening noted Samsung HS60 device utilized with permanent recording / reporting. Verbal informed consent obtained and verified. Skin prepped in a sterile fashion. Ethyl chloride for topical local analgesia.  Completed without difficulty and tolerated well. Aspirate: 38 mL turbid synovial fluid Medication: triamcinolone  acetonide 40 mg/mL suspension for injection 1 mL total and 2 mL lidocaine  1% without epinephrine utilized for needle placement anesthetic Advised to contact for fevers/chills, erythema, induration, drainage, or persistent bleeding.   Pertinent History, Exam, Impression, and Recommendations:   Problem List Items Addressed This Visit     Effusion of right knee - Primary   History of Present Illness George Gomez is a 63 year old male who presents for joint fluid aspiration and injection.  Response to prior intra-articular therapy - Previous joint aspirations and corticosteroid injections have provided symptomatic relief - Numbing medication provides immediate but temporary relief - Corticosteroid injection  typically provides relief within 12 to 72 hours  Physical Exam RANGE OF MOTION: Right knee examined, fluid aspirated from knee joint. No abnormalities noted in right knee.  Assessment and Plan Right knee effusion and primary osteoarthritis Differential includes pseudogout, osteoarthritis related effusion. - Perform right knee fluid aspiration. - Send aspirated fluid for lab analysis. - Administer steroid and anesthetic injection into right knee. - Advise rest and limited activity for two days. - Recommend ice application for 20 minutes twice daily. - Apply ACE wrap for compression, remove at bedtime. - Follow up with lab results for further management.      Relevant Orders   US  LIMITED JOINT SPACE STRUCTURES LOW RIGHT   Synovial fluid, cell count   Body Fluid Culture Aer/Ana/GS     Orders & Medications Medications: No orders of the defined types were placed in this encounter.  Orders Placed This Encounter  Procedures   Body Fluid Culture Aer/Ana/GS   US  LIMITED JOINT SPACE STRUCTURES LOW RIGHT   Synovial fluid, cell count     No follow-ups on file.     Selinda JINNY Ku, MD, [facility]   Primary Care Sports Medicine Primary Care and Sports Medicine at [facility]

## 2023-09-16 NOTE — Assessment & Plan Note (Signed)
 History of Present Illness George Gomez is a 63 year old male who presents for joint fluid aspiration and injection.  Response to prior intra-articular therapy - Previous joint aspirations and corticosteroid injections have provided symptomatic relief - Numbing medication provides immediate but temporary relief - Corticosteroid injection typically provides relief within 12 to 72 hours  Physical Exam RANGE OF MOTION: Right knee examined, fluid aspirated from knee joint. No abnormalities noted in right knee.  Assessment and Plan Right knee effusion and primary osteoarthritis Differential includes pseudogout, osteoarthritis related effusion. - Perform right knee fluid aspiration. - Send aspirated fluid for lab analysis. - Administer steroid and anesthetic injection into right knee. - Advise rest and limited activity for two days. - Recommend ice application for 20 minutes twice daily. - Apply ACE wrap for compression, remove at bedtime. - Follow up with lab results for further management.

## 2023-09-16 NOTE — Addendum Note (Signed)
 Addended by: Arham Symmonds on: 09/16/2023 11:50 AM   Modules accepted: Orders

## 2023-09-16 NOTE — Patient Instructions (Signed)
 You have just been given a cortisone injection to reduce pain and inflammation. After the injection you may notice immediate relief of pain as a result of the Lidocaine . It is important to rest the area of the injection for 24 to 48 hours after the injection. There is a possibility of some temporary increased discomfort and swelling for up to 72 hours until the cortisone begins to work. If you do have pain, simply rest the joint and use ice. If you can tolerate over the counter medications, you can try Tylenol , Aleve, or Advil for added relief per package instructions. Patient Plan for Post-Visit Guidance  - Right knee fluid was aspirated and sent for lab analysis. - Steroid and anesthetic injection given to the right knee. - Rest and limit activity for the next two days. - Apply ice to the right knee for 20 minutes, twice daily. - Use an ACE wrap for compression during the day; remove it at bedtime. - Follow up after lab results are available for further management.  Red Flags - If you develop increased knee pain, redness, warmth, swelling, fever, or difficulty moving the knee, seek medical attention promptly.

## 2023-09-17 ENCOUNTER — Telehealth: Payer: Self-pay

## 2023-09-18 ENCOUNTER — Telehealth: Payer: Self-pay

## 2023-09-18 NOTE — Telephone Encounter (Signed)
 Copied from CRM [id number]. Topic: General - Other >> Sep 18, 2023  3:16 PM Carlatta H wrote: Reason for CRM: Patient received a call yesterday from RN Jackson Acron please return the patients call

## 2023-09-22 ENCOUNTER — Telehealth: Payer: Self-pay

## 2023-09-22 NOTE — Telephone Encounter (Signed)
 Copied from CRM #[id number]. Topic: General - Call Back - No Documentation >> Sep 22, 2023  9:15 AM Tiffany S wrote: Reason for CRM: Patient was returning a call from [facility] please follow up with patient >> Sep 22, 2023  9:30 AM Berwyn MATSU wrote: Patient called back requesting a call back to discuss results.   May you please assist.

## 2023-10-04 LAB — BODY FLUID CULTURE AER/ANA/GS: Organism ID, Bacteria: NONE SEEN

## 2023-10-04 LAB — SYNOVIAL FLUID, CELL COUNT
Nuc cell # Fld: 72 {cells}/uL (ref 0–200)
RBC, Fluid: 4000 /uL

## 2023-10-04 LAB — RESULT

## 2023-10-05 ENCOUNTER — Ambulatory Visit: Payer: Self-pay | Admitting: Family Medicine

## 2023-10-16 ENCOUNTER — Ambulatory Visit: Payer: Self-pay

## 2023-10-16 NOTE — Telephone Encounter (Signed)
 FYI Only or Action Required?: FYI only for provider.  Patient was last seen in primary care on 09/16/2023 by Alvia Selinda PARAS, MD.  Called Nurse Triage reporting Eye Pain, Belepharitis, Conjunctivitis, and Eye Drainage.  Symptoms began several days ago.  Interventions attempted: Rest, hydration, or home remedies.  Symptoms are: gradually worsening.  Triage Disposition: See HCP Within 4 Hours (Or PCP Triage)  Patient/caregiver understands and will follow disposition?: No, refuses disposition     Copied from CRM [id number]. Topic: Clinical - Red Word Triage >> Oct 16, 2023 11:11 AM Myrick T wrote: [facility] that prompted transfer to Nurse Triage: patient said he is experiencing pain in his right eye and his vision is blurred. Reason for Disposition  MODERATE eye pain or discomfort (e.g., interferes with normal activities or awakens from sleep; more than mild)  Answer Assessment - Initial Assessment Questions Advised exam in next 4 hours, pt refusing disposition, requesting appt Monday, no PCP availability, scheduled earliest available pt would accept at other office [facility], confirmed location/appt info. Advised pt seek immediate care at UC/ED if any worsening.    Pt is poor historian  1. ONSET: When did the pain start? (e.g., minutes, hours, days)     About 2-3 weeks ago, wasn't really painful was just itching now getting a little painful 2. TIMING: Does the pain come and go, or has it been constant since it started? (e.g., constant, intermittent, fleeting)     Pain just this week, burning started today Pt unsure if comes and goes or constant Constant for the day 3. SEVERITY: How bad is the pain?  (Scale 1-10; mild, moderate or severe)     4/10 4. LOCATION: Where does it hurt?  (e.g., eyelid, eye, cheekbone)     Right eye 6. VISION: Do you have blurred vision or changes in your vision?      Still able to see out of the eye, blurry, have to wipe it to see clearly 7. EYE  DISCHARGE: Is there any discharge (pus) from the eye(s)?  If Yes, ask: What color is it?      Stuff coming out of eye is white, eye is stuck shut in morning 8. FEVER: Do you have a fever? If Yes, ask: What is it, how was it measured, and when did it start?      Don't feel like it 9. OTHER SYMPTOMS: Do you have any other symptoms? (e.g., headache, nasal discharge, facial rash)     Friend told him eye was swollen Red in eye Itching Every now and then I have a headache  Protocols used: Eye Pain and Other Symptoms-A-AH

## 2023-10-19 ENCOUNTER — Ambulatory Visit (INDEPENDENT_AMBULATORY_CARE_PROVIDER_SITE_OTHER)

## 2023-10-19 VITALS — BP 132/78 | HR 71 | Temp 98.1°F | Wt 206.2 lb

## 2023-10-19 DIAGNOSIS — H04213 Epiphora due to excess lacrimation, bilateral lacrimal glands: Secondary | ICD-10-CM | POA: Diagnosis not present

## 2023-10-19 DIAGNOSIS — H04219 Epiphora due to excess lacrimation, unspecified lacrimal gland: Secondary | ICD-10-CM | POA: Insufficient documentation

## 2023-10-19 DIAGNOSIS — H5789 Other specified disorders of eye and adnexa: Secondary | ICD-10-CM | POA: Diagnosis not present

## 2023-10-19 MED ORDER — SYSTANE 0.4-0.3 % OP SOLN: 1.0000 [drp] | Freq: Two times a day (BID) | OPHTHALMIC | 0 refills | Status: DC

## 2023-10-19 NOTE — Progress Notes (Signed)
 *)    Acute Patient Visit  Physician: Hommer Cunliffe A Aveya Beal, MD  Patient: George Gomez MRN: [id number] DOB: Jul 12, 1960 PCP: Valerio Melanie DASEN, NP     Subjective:   Chief Complaint  Patient presents with   Eye Pain    Bilateral... Right is worse-- itchy and burning x 1-2 weeks.SABRASABRAWith discharge, redness and swelling...  Pt has not tried anything... Denies known exposure to anyone sick     HPI: The patient is a 63 y.o. male who presents today for:   Discussed the use of AI scribe software for clinical note transcription with the patient, who gave verbal consent to proceed.  History of Present Illness         Patient seen for acute visit.  Onset of right-sided eye irritation over the course of the last 2 weeks.  No pain, no change in vision but he does have constant tearing of both eyes.  Patient feels that his eye is irritated on the right constantly.  No foreign body sensation.  No pain with eye movement.  No discharge otherwise  Patient is a type II diabetic.  He has not had a diabetic eye exam in the last year.     ROS:   As noted in the HPI    ASSESMENT/PLAN:  Encounter Diagnoses  Name Primary?   Epiphora due to excess lacrimation of both sides Yes   Eye irritation     No orders of the defined types were placed in this encounter.   Assessment and Plan     #1 bilateral epiphora - Query dry eyes - will prescribe eye drops to trial.  Referral to ophthalmology for DM eye exam and further eval. He reports a history of possible macular degeneration?  Otherwise, patient has a mole on the lower margin of the right eyelid which is encroaching into his visual field and may be causing irritation on the right.  Will refer to plastics for removal.   OBJECTIVE: Vitals:   10/19/23 1022  BP: 132/78  Pulse: 71  Temp: 98.1 F (36.7 C)  TempSrc: Oral  Weight: 206 lb 3.2 oz (93.5 kg)    Body mass index is 29.59 kg/m.   Physical Exam Vitals reviewed.   Constitutional:      Appearance: Normal appearance. Well-developed with normal weight.  Cardiovascular:     Rate and Rhythm: Normal rate and regular rhythm. Normal heart sounds. Normal peripheral pulses Pulmonary:     Normal breath sounds with normal effort Skin:    General: Skin is warm and dry without noticeable rash. Neurological:     General: No focal deficit present.  Psychiatric:        Mood and Affect: Mood, behavior and cognition normal     Eyes:  Bilateral epiphora, clear discharge with conjunctival irritation. PERRLA, No FB noted, normal conjuctiva.  5mm mole encroaching on lower margin of eye   Allergies Patient is allergic to lisinopril  and losartan .  Past Medical History Patient  has a past medical history of Acute on chronic renal failure (HCC) (03/20/2016), Acute renal failure (ARF) (HCC) (08/28/2016), Acute renal failure (HCC) (10/12/2018), Anemia, B12 deficiency (12/11/2017), Clostridium difficile diarrhea (03/18/2016), Diabetes mellitus without complication (HCC), Diverticulitis, EtOH dependence (HCC), Folate deficiency (07/25/2018), GI bleed (05/30/2017), Hypercholesteremia, Hypertension, Melena (10/21/2018), and Weight loss (09/15/2018).  Surgical History Patient  has a past surgical history that includes none; Esophagogastroduodenoscopy (egd) with propofol  (N/A, 06/01/2017); Esophagogastroduodenoscopy (N/A, 07/02/2018); enteroscopy (N/A, 10/16/2018); Colonoscopy with propofol  (N/A, 10/16/2018); Givens capsule study (N/A,  10/17/2018); Givens capsule study (N/A, 08/02/2019); and IR BONE MARROW BIOPSY & ASPIRATION (05/18/2023).  Family History Pateint's family history includes COPD in his sister; Cancer in his father, maternal grandfather, and paternal grandfather; Diabetes in his father, maternal grandmother, and paternal grandmother; Heart disease in his father; Rheum arthritis in his mother and sister.  Social History Patient  reports that he has never smoked. He has never used  smokeless tobacco. He reports current alcohol  use of about 21.0 standard drinks of alcohol  per week. He reports that he does not use drugs.    10/19/2023

## 2023-10-19 NOTE — Addendum Note (Signed)
 Addended by: Celisa Schoenberg A on: 10/19/2023 11:37 AM   Modules accepted: Orders

## 2023-10-21 ENCOUNTER — Other Ambulatory Visit: Payer: Self-pay

## 2023-10-21 ENCOUNTER — Other Ambulatory Visit: Payer: Self-pay | Admitting: Nurse Practitioner

## 2023-10-21 DIAGNOSIS — E1140 Type 2 diabetes mellitus with diabetic neuropathy, unspecified: Secondary | ICD-10-CM

## 2023-10-22 NOTE — Telephone Encounter (Signed)
 Requested Prescriptions  Refused Prescriptions Disp Refills   gabapentin  (NEURONTIN ) 300 MG capsule [Pharmacy Med Name: GABAPENTIN  300MG  CAPSULES] 360 capsule 4    Sig: TAKE ONE CAPSULE BY MOUTH EVERY MORNING, 1 AT NOON, AND 2 AT BEDTIME     Neurology: Anticonvulsants - gabapentin  Passed - 10/22/2023  2:55 PM      Passed - Cr in normal range and within 360 days    Creatinine  Date Value Ref Range Status  09/10/2023 1.05 0.61 - 1.24 mg/dL Final  91/75/7984 8.74 0.60 - 1.30 mg/dL Final         Passed - Completed PHQ-2 or PHQ-9 in the last 360 days      Passed - Valid encounter within last 12 months    Recent Outpatient Visits           3 days ago Epiphora due to excess lacrimation of both sides   [place] [facility] [place], [place], MD   1 month ago Effusion of right knee   [facility] Ku, Selinda PARAS, MD   1 month ago Effusion of right knee   [facility] Primary [facility] & [facility] Medicine at [facility] Ku, Selinda PARAS, MD   2 months ago Acute gout of right knee, unspecified cause   [place] [facility] Herold Hadassah SQUIBB, MD   2 months ago Acute gout of right knee, unspecified cause   [place] [facility] Melvin Pao, NP

## 2023-10-28 ENCOUNTER — Telehealth: Payer: Self-pay | Admitting: *Deleted

## 2023-10-28 ENCOUNTER — Encounter: Payer: Self-pay | Admitting: Hematology and Oncology

## 2023-10-28 MED ORDER — COPPER 2 MG PO TABS: 2.0000 mg | Freq: Every day | 1 refills | Status: DC

## 2023-10-28 NOTE — Telephone Encounter (Signed)
 Per Lauren-patient's copper  level was quite low which may be contributing to his anemia. I recommend he start a copper  supplement which is available otc. Should be 2 mg of elemental copper  daily. Please let him know to start it.   I spoke with patient. Patient instructed that his labs showed copper  level low. Patient instructed to start on copper  2 mg. He gave verbal understanding.

## 2023-10-30 LAB — OPHTHALMOLOGY REPORT-SCANNED

## 2023-11-04 ENCOUNTER — Other Ambulatory Visit: Payer: Self-pay

## 2023-11-04 ENCOUNTER — Observation Stay
Admission: EM | Admit: 2023-11-04 | Discharge: 2023-11-05 | Disposition: A | Discharge status: Home / Self Care | Attending: Student in an Organized Health Care Education/Training Program | Admitting: Student in an Organized Health Care Education/Training Program

## 2023-11-04 ENCOUNTER — Emergency Department

## 2023-11-04 DIAGNOSIS — E876 Hypokalemia: Secondary | ICD-10-CM

## 2023-11-04 DIAGNOSIS — R197 Diarrhea, unspecified: Secondary | ICD-10-CM | POA: Diagnosis present

## 2023-11-04 DIAGNOSIS — D643 Other sideroblastic anemias: Secondary | ICD-10-CM | POA: Insufficient documentation

## 2023-11-04 DIAGNOSIS — E1122 Type 2 diabetes mellitus with diabetic chronic kidney disease: Secondary | ICD-10-CM | POA: Insufficient documentation

## 2023-11-04 DIAGNOSIS — Z79899 Other long term (current) drug therapy: Secondary | ICD-10-CM | POA: Diagnosis not present

## 2023-11-04 DIAGNOSIS — N183 Chronic kidney disease, stage 3 unspecified: Secondary | ICD-10-CM | POA: Diagnosis not present

## 2023-11-04 DIAGNOSIS — K760 Fatty (change of) liver, not elsewhere classified: Secondary | ICD-10-CM | POA: Diagnosis not present

## 2023-11-04 DIAGNOSIS — K219 Gastro-esophageal reflux disease without esophagitis: Secondary | ICD-10-CM | POA: Diagnosis not present

## 2023-11-04 DIAGNOSIS — K5730 Diverticulosis of large intestine without perforation or abscess without bleeding: Secondary | ICD-10-CM | POA: Diagnosis not present

## 2023-11-04 DIAGNOSIS — I700 Atherosclerosis of aorta: Secondary | ICD-10-CM | POA: Diagnosis not present

## 2023-11-04 DIAGNOSIS — N189 Chronic kidney disease, unspecified: Secondary | ICD-10-CM | POA: Insufficient documentation

## 2023-11-04 DIAGNOSIS — E785 Hyperlipidemia, unspecified: Secondary | ICD-10-CM | POA: Diagnosis not present

## 2023-11-04 DIAGNOSIS — K921 Melena: Secondary | ICD-10-CM | POA: Insufficient documentation

## 2023-11-04 DIAGNOSIS — R809 Proteinuria, unspecified: Secondary | ICD-10-CM | POA: Insufficient documentation

## 2023-11-04 DIAGNOSIS — F1090 Alcohol use, unspecified, uncomplicated: Secondary | ICD-10-CM | POA: Insufficient documentation

## 2023-11-04 DIAGNOSIS — E1129 Type 2 diabetes mellitus with other diabetic kidney complication: Secondary | ICD-10-CM | POA: Diagnosis present

## 2023-11-04 DIAGNOSIS — R1030 Lower abdominal pain, unspecified: Secondary | ICD-10-CM | POA: Diagnosis present

## 2023-11-04 DIAGNOSIS — M1A9XX0 Chronic gout, unspecified, without tophus (tophi): Secondary | ICD-10-CM | POA: Diagnosis not present

## 2023-11-04 DIAGNOSIS — D640 Hereditary sideroblastic anemia: Secondary | ICD-10-CM | POA: Insufficient documentation

## 2023-11-04 LAB — CBC
HCT: 30.4 % — ABNORMAL LOW (ref 39.0–52.0)
HCT: 28.5 % — ABNORMAL LOW (ref 39.0–52.0)
Hemoglobin: 10 g/dL — ABNORMAL LOW (ref 13.0–17.0)
Hemoglobin: 9.2 g/dL — ABNORMAL LOW (ref 13.0–17.0)
MCH: 31.3 pg (ref 26.0–34.0)
MCH: 30.7 pg (ref 26.0–34.0)
MCHC: 32.9 g/dL (ref 30.0–36.0)
MCHC: 32.3 g/dL (ref 30.0–36.0)
MCV: 95 fL (ref 80.0–100.0)
MCV: 95 fL (ref 80.0–100.0)
Platelets: 121 K/uL — ABNORMAL LOW (ref 150–400)
Platelets: 109 K/uL — ABNORMAL LOW (ref 150–400)
RBC: 3.2 MIL/uL — ABNORMAL LOW (ref 4.22–5.81)
RBC: 3 MIL/uL — ABNORMAL LOW (ref 4.22–5.81)
RDW: 16.3 % — ABNORMAL HIGH (ref 11.5–15.5)
RDW: 16.1 % — ABNORMAL HIGH (ref 11.5–15.5)
WBC: 3.9 K/uL — ABNORMAL LOW (ref 4.0–10.5)
WBC: 3.5 K/uL — ABNORMAL LOW (ref 4.0–10.5)
nRBC: 0.5 % — ABNORMAL HIGH (ref 0.0–0.2)
nRBC: 0.6 % — ABNORMAL HIGH (ref 0.0–0.2)

## 2023-11-04 LAB — COMPREHENSIVE METABOLIC PANEL WITH GFR
ALT: 46 U/L — ABNORMAL HIGH (ref 0–44)
AST: 70 U/L — ABNORMAL HIGH (ref 15–41)
Albumin: 3.5 g/dL (ref 3.5–5.0)
Alkaline Phosphatase: 89 U/L (ref 38–126)
Anion gap: 14 (ref 5–15)
BUN: 12 mg/dL (ref 8–23)
CO2: 24 mmol/L (ref 22–32)
Calcium: 8.5 mg/dL — ABNORMAL LOW (ref 8.9–10.3)
Chloride: 96 mmol/L — ABNORMAL LOW (ref 98–111)
Creatinine, Ser: 1.01 mg/dL (ref 0.61–1.24)
GFR, Estimated: >60 mL/min (ref >60)
Glucose, Bld: 181 mg/dL — ABNORMAL HIGH (ref 70–99)
Potassium: 2.7 mmol/L — CL (ref 3.5–5.1)
Sodium: 134 mmol/L — ABNORMAL LOW (ref 135–145)
Total Bilirubin: 0.9 mg/dL (ref 0.0–1.2)
Total Protein: 7.3 g/dL (ref 6.5–8.1)

## 2023-11-04 LAB — GASTROINTESTINAL PANEL BY PCR, STOOL (REPLACES STOOL CULTURE)

## 2023-11-04 LAB — URINALYSIS, ROUTINE W REFLEX MICROSCOPIC
Bacteria, UA: NONE SEEN
Glucose, UA: NEGATIVE mg/dL
Hgb urine dipstick: NEGATIVE
Ketones, ur: 5 mg/dL — AB
Leukocytes,Ua: NEGATIVE
Nitrite: NEGATIVE
Protein, ur: 100 mg/dL — AB
RBC / HPF: 0 RBC/hpf (ref 0–5)
Specific Gravity, Urine: 1.026 (ref 1.005–1.030)
pH: 5 (ref 5.0–8.0)

## 2023-11-04 LAB — LIPASE, BLOOD: Lipase: 41 U/L (ref 11–51)

## 2023-11-04 LAB — C DIFFICILE QUICK SCREEN W PCR REFLEX
C Diff antigen: POSITIVE — AB
C Diff toxin: NEGATIVE

## 2023-11-04 LAB — CREATININE, SERUM
Creatinine, Ser: 0.83 mg/dL (ref 0.61–1.24)
GFR, Estimated: >60 mL/min (ref >60)

## 2023-11-04 LAB — MAGNESIUM: Magnesium: 1.2 mg/dL — ABNORMAL LOW (ref 1.7–2.4)

## 2023-11-04 LAB — CLOSTRIDIUM DIFFICILE BY PCR, REFLEXED
Hypervirulent Strain: NEGATIVE
Toxigenic C. Difficile by PCR: NEGATIVE

## 2023-11-04 MED ORDER — ATORVASTATIN CALCIUM 20 MG PO TABS
40.0000 mg | ORAL_TABLET | Freq: Every day | ORAL | Status: DC
  Administered 2023-11-04 – 2023-11-05 (×2): 40 mg via ORAL
  Filled 2023-11-04 (×2): qty 2

## 2023-11-04 MED ORDER — COLCHICINE 0.6 MG PO TABS
0.6000 mg | ORAL_TABLET | Freq: Two times a day (BID) | ORAL | Status: DC
  Administered 2023-11-04 – 2023-11-05 (×3): 0.6 mg via ORAL
  Filled 2023-11-04 (×4): qty 1

## 2023-11-04 MED ORDER — ENOXAPARIN SODIUM 40 MG/0.4ML IJ SOSY
40.0000 mg | PREFILLED_SYRINGE | INTRAMUSCULAR | Status: DC
  Administered 2023-11-04: 40 mg via SUBCUTANEOUS
  Filled 2023-11-04: qty 0.4

## 2023-11-04 MED ORDER — IOHEXOL 300 MG/ML  SOLN
100.0000 mL | Freq: Once | INTRAMUSCULAR | Status: AC | PRN
  Administered 2023-11-04: 100 mL via INTRAVENOUS

## 2023-11-04 MED ORDER — POTASSIUM CHLORIDE 10 MEQ/100ML IV SOLN
10.0000 meq | Freq: Once | INTRAVENOUS | Status: AC
  Administered 2023-11-04: 10 meq via INTRAVENOUS
  Filled 2023-11-04: qty 100

## 2023-11-04 MED ORDER — SODIUM CHLORIDE 0.9 % IV SOLN: INTRAVENOUS | Status: DC

## 2023-11-04 MED ORDER — SODIUM CHLORIDE 0.9 % IV BOLUS
1000.0000 mL | Freq: Once | INTRAVENOUS | Status: AC
  Administered 2023-11-04: 1000 mL via INTRAVENOUS

## 2023-11-04 MED ORDER — ALLOPURINOL 300 MG PO TABS
300.0000 mg | ORAL_TABLET | Freq: Every day | ORAL | Status: DC
  Administered 2023-11-04 – 2023-11-05 (×2): 300 mg via ORAL
  Filled 2023-11-04 (×3): qty 1

## 2023-11-04 MED ORDER — ONDANSETRON HCL 4 MG PO TABS: 4.0000 mg | ORAL_TABLET | Freq: Four times a day (QID) | ORAL | Status: DC | PRN

## 2023-11-04 MED ORDER — POTASSIUM CHLORIDE 10 MEQ/100ML IV SOLN
10.0000 meq | INTRAVENOUS | Status: AC
  Administered 2023-11-04 (×4): 10 meq via INTRAVENOUS
  Filled 2023-11-04 (×3): qty 100

## 2023-11-04 MED ORDER — ACETAMINOPHEN 325 MG PO TABS: 650.0000 mg | ORAL_TABLET | Freq: Four times a day (QID) | ORAL | Status: DC | PRN

## 2023-11-04 MED ORDER — MAGNESIUM SULFATE 2 GM/50ML IV SOLN
2.0000 g | Freq: Once | INTRAVENOUS | Status: AC
  Administered 2023-11-04: 2 g via INTRAVENOUS
  Filled 2023-11-04: qty 50

## 2023-11-04 MED ORDER — ONDANSETRON HCL 4 MG/2ML IJ SOLN: 4.0000 mg | Freq: Four times a day (QID) | INTRAMUSCULAR | Status: DC | PRN

## 2023-11-04 MED ORDER — PROPRANOLOL HCL ER 60 MG PO CP24
60.0000 mg | ORAL_CAPSULE | Freq: Every day | ORAL | Status: DC
  Administered 2023-11-04 – 2023-11-05 (×2): 60 mg via ORAL
  Filled 2023-11-04 (×3): qty 1

## 2023-11-04 MED ORDER — VITAMIN B-12 1000 MCG PO TABS
1000.0000 ug | ORAL_TABLET | Freq: Every day | ORAL | Status: DC
  Administered 2023-11-04 – 2023-11-05 (×2): 1000 ug via ORAL
  Filled 2023-11-04: qty 1
  Filled 2023-11-04: qty 2

## 2023-11-04 MED ORDER — MAGNESIUM SULFATE 2 GM/50ML IV SOLN
2.0000 g | Freq: Once | INTRAVENOUS | Status: DC
  Filled 2023-11-04: qty 50

## 2023-11-04 MED ORDER — ACETAMINOPHEN 650 MG RE SUPP: 650.0000 mg | Freq: Four times a day (QID) | RECTAL | Status: DC | PRN

## 2023-11-04 MED ORDER — PANTOPRAZOLE SODIUM 40 MG PO TBEC
40.0000 mg | DELAYED_RELEASE_TABLET | Freq: Every day | ORAL | Status: DC
  Administered 2023-11-04 – 2023-11-05 (×2): 40 mg via ORAL
  Filled 2023-11-04 (×2): qty 1

## 2023-11-04 NOTE — ED Notes (Signed)
 Called CCMD to put patient on the monitor.

## 2023-11-04 NOTE — ED Triage Notes (Signed)
 Patient states diarrhea and abdominal pain for 1.5 weeks; states last week he had blood in his stools but denies that today.

## 2023-11-04 NOTE — ED Provider Notes (Signed)
 [facility] Provider Note    Event Date/Time   First MD Initiated Contact with Patient 11/04/23 [phone]     (approximate)   History   Diarrhea   HPI  George Gomez is a 63 y.o. male history significant for pancytopenia with anemia, who presents to the emergency department with diarrhea.  Patient states that he has been having ongoing diarrhea for the past 1.5 weeks.  He states that initially he was having some blood in his stool but now it looks black.  Not on anticoagulation.  Complaining of pain to his lower abdomen.  No recent antibiotic use.  States that he said 3-4 episodes of diarrhea since yesterday.  Denies any significant nausea or vomiting.  No significant alcohol  use.     Physical Exam   Triage Vital Signs: ED Triage Vitals  Encounter Vitals Group     BP 11/04/23 0919 (!) 147/85     Girls Systolic BP Percentile --      Girls Diastolic BP Percentile --      Boys Systolic BP Percentile --      Boys Diastolic BP Percentile --      Pulse Rate 11/04/23 0919 (!) 102     Resp 11/04/23 0919 20     Temp 11/04/23 0919 98.8 F (37.1 C)     Temp Source 11/04/23 0919 Oral     SpO2 11/04/23 0919 95 %     Weight --      Height --      Head Circumference --      Peak Flow --      Pain Score 11/04/23 0918 5     Pain Loc --      Pain Education --      Exclude from Growth Chart --     Most recent vital signs: Vitals:   11/04/23 1427 11/04/23 1553  BP: (!) 141/93 137/83  Pulse: 87 71  Resp:  18  Temp:  99.5 F (37.5 C)  SpO2:  99%    Physical Exam Exam conducted with a chaperone present.  Constitutional:      Appearance: He is well-developed.  HENT:     Head: Atraumatic.  Eyes:     Conjunctiva/sclera: Conjunctivae normal.  Cardiovascular:     Rate and Rhythm: Regular rhythm. Tachycardia present.  Pulmonary:     Effort: No respiratory distress.  Abdominal:     Tenderness: There is no abdominal tenderness (Left lower quadrant abdominal  tenderness palpation with no rebound or guarding).  Genitourinary:    Comments: Rectal exam with no gross blood or melena Musculoskeletal:     Cervical back: Normal range of motion.     Right lower leg: No edema.     Left lower leg: No edema.  Skin:    General: Skin is warm.     Capillary Refill: Capillary refill takes less than 2 seconds.  Neurological:     Mental Status: He is alert. Mental status is at baseline.     IMPRESSION / MDM / ASSESSMENT AND PLAN / ED COURSE  I reviewed the triage vital signs and the nursing notes.  Differential diagnosis including infectious diarrhea, dehydration, electrolyte abnormality, C. difficile, diverticulitis, intra-abdominal abscess  EKG  I, Clotilda Punter, the attending physician, personally viewed and interpreted this ECG.  Significant underlying artifact.  No significant ST elevation or depression.  No findings of acute ischemia or dysrhythmia.  No tachycardic or bradycardic dysrhythmias while on cardiac telemetry.  RADIOLOGY  CT scan abdomen and pelvis with contrast with no acute findings.  LABS (all labs ordered are listed, but only abnormal results are displayed) Labs interpreted as -    Labs Reviewed  C DIFFICILE QUICK SCREEN W PCR REFLEX   - Abnormal; Notable for the following components:      Result Value   C Diff antigen POSITIVE (*)    All other components within normal limits  COMPREHENSIVE METABOLIC PANEL WITH GFR - Abnormal; Notable for the following components:   Sodium 134 (*)    Potassium 2.7 (*)    Chloride 96 (*)    Glucose, Bld 181 (*)    Calcium  8.5 (*)    AST 70 (*)    ALT 46 (*)    All other components within normal limits  CBC - Abnormal; Notable for the following components:   WBC 3.9 (*)    RBC 3.20 (*)    Hemoglobin 10.0 (*)    HCT 30.4 (*)    RDW 16.3 (*)    Platelets 121 (*)    nRBC 0.5 (*)    All other components within normal limits  URINALYSIS, ROUTINE W REFLEX MICROSCOPIC - Abnormal;  Notable for the following components:   Color, Urine AMBER (*)    APPearance HAZY (*)    Bilirubin Urine SMALL (*)    Ketones, ur 5 (*)    Protein, ur 100 (*)    All other components within normal limits  MAGNESIUM  - Abnormal; Notable for the following components:   Magnesium  1.2 (*)    All other components within normal limits  CBC - Abnormal; Notable for the following components:   WBC 3.5 (*)    RBC 3.00 (*)    Hemoglobin 9.2 (*)    HCT 28.5 (*)    RDW 16.1 (*)    Platelets 109 (*)    nRBC 0.6 (*)    All other components within normal limits  GASTROINTESTINAL PANEL BY PCR, STOOL (REPLACES STOOL CULTURE)  CLOSTRIDIUM DIFFICILE BY PCR, REFLEXED  LIPASE, BLOOD  CREATININE, SERUM  HIV ANTIBODY (ROUTINE TESTING W REFLEX)     MDM    Given IV fluids.  Multiple electrolyte abnormalities including hypokalemia and hypomagnesia him.  Given oral and IV replacement.  No findings of a urinary tract infection.  C. difficile antigen is positive but toxin was negative.  No history of C. difficile in the past and stool studies are currently in process.  No obvious findings of a GI bleed on my exam and no significant melena.  Hemoglobin is stable.  No signs of prolonged QTc but does have significant artifact on EKG.  Consulted hospitalist for admission for diarrhea with multiple electrolyte abnormality and dehydration.   PROCEDURES:  Critical Care performed: No  Procedures  Patient's presentation is most consistent with acute presentation with potential threat to life or bodily function.   MEDICATIONS ORDERED IN ED: Medications  enoxaparin  (LOVENOX ) injection 40 mg (has no administration in time range)  acetaminophen  (TYLENOL ) tablet 650 mg (has no administration in time range)    Or  acetaminophen  (TYLENOL ) suppository 650 mg (has no administration in time range)  0.9 %  sodium chloride  infusion ( Intravenous New Bag/Given 11/04/23 1432)  ondansetron  (ZOFRAN ) tablet 4 mg (has no  administration in time range)    Or  ondansetron  (ZOFRAN ) injection 4 mg (has no administration in time range)  allopurinol  (ZYLOPRIM ) tablet 300 mg (300 mg Oral Given 11/04/23 1427)  atorvastatin  (LIPITOR) tablet 40  mg (40 mg Oral Given 11/04/23 1426)  colchicine  tablet 0.6 mg (0.6 mg Oral Given 11/04/23 1427)  cyanocobalamin  (VITAMIN B12) tablet 1,000 mcg (1,000 mcg Oral Given 11/04/23 1426)  pantoprazole  (PROTONIX ) EC tablet 40 mg (40 mg Oral Given 11/04/23 1426)  propranolol  ER (INDERAL  LA) 24 hr capsule 60 mg (60 mg Oral Given 11/04/23 1427)  potassium chloride  10 mEq in 100 mL IVPB (10 mEq Intravenous New Bag/Given 11/04/23 1431)  magnesium  sulfate IVPB 2 g 50 mL (has no administration in time range)  sodium chloride  0.9 % bolus 1,000 mL (0 mLs Intravenous Stopped 11/04/23 1401)  iohexol  (OMNIPAQUE ) 300 MG/ML solution 100 mL (100 mLs Intravenous Contrast Given 11/04/23 1128)  potassium chloride  10 mEq in 100 mL IVPB (0 mEq Intravenous Stopped 11/04/23 1401)  magnesium  sulfate IVPB 2 g 50 mL (2 g Intravenous New Bag/Given 11/04/23 1431)    FINAL CLINICAL IMPRESSION(S) / ED DIAGNOSES   Final diagnoses:  Diarrhea, unspecified type  Hypomagnesemia  Hypokalemia     Rx / DC Orders   ED Discharge Orders     None        Note:  This document was prepared using Dragon voice recognition software and may include unintentional dictation errors.   Suzanne Kirsch, MD 11/04/23 1601

## 2023-11-04 NOTE — H&P (Signed)
 History and Physical    George Gomez FMW:[id number] DOB: 1960-02-12 DOA: 11/04/2023  DOS: the patient was seen and examined on 11/04/2023  PCP: Valerio Melanie DASEN, NP   Patient coming from: Home  I have personally briefly reviewed patient's old medical records in [facility]  Chief Complaint: Nausea vomiting diarrhea  HPI: George Gomez is a pleasant 63 y.o. male with medical history significant for history of Clostridium difficile diarrhea, anemia, B12 deficiency, DM off medication, HTN, HLD, chronic melena who presented to ED nausea, abdominal pain, diarrhea 1-1/2 weeks.  Patient stated that he had eaten hamburger last week and then he started having his symptoms worsened.  He also reported that he had blood in the stool one time but no further blood in the stool.  Patient stated that he was feeling nauseous after he ate hamburger but did not vomit.  His abdominal pain is diffuse 5/10 in intensity, sharp, intermittent associated with some bloating.  Denies any fever, chills, chest pain, palpitations.  ED Course: Upon arrival to the ED, patient is found to be weak, hypokalemic at 2.7, creatinine 1.01, magnesium  1.2.  Digital exam in the emergency room by Dr. Suzanne did not reveal any blood, stool was brown.  Hospitalist service was consulted for evaluation for admission for severe electrolyte abnormalities with ongoing nausea abdominal discomfort and diarrhea  Review of Systems:  ROS  All other systems negative except as noted in the HPI.  Past Medical History:  Diagnosis Date   Acute on chronic renal failure 03/20/2016   Acute renal failure 10/12/2018   Acute renal failure (ARF) 08/28/2016   Anemia    B12 deficiency 12/11/2017   Clostridium difficile diarrhea 03/18/2016   Diabetes mellitus without complication (HCC)    Diverticulitis    Pt states diverticulitis   EtOH dependence (HCC)    Folate deficiency 07/25/2018   GI bleed 05/30/2017   Hypercholesteremia    Hypertension     Melena 10/21/2018   Weight loss 09/15/2018    Past Surgical History:  Procedure Laterality Date   COLONOSCOPY WITH PROPOFOL  N/A 10/16/2018   Procedure: COLONOSCOPY WITH PROPOFOL ;  Surgeon: Unk Corinn Skiff, MD;  Location: [facility] ENDOSCOPY;  Service: Gastroenterology;  Laterality: N/A;   ENTEROSCOPY N/A 10/16/2018   Procedure: ENTEROSCOPY;  Surgeon: Unk Corinn Skiff, MD;  Location: [facility] ENDOSCOPY;  Service: Gastroenterology;  Laterality: N/A;   ESOPHAGOGASTRODUODENOSCOPY N/A 07/02/2018   Procedure: ESOPHAGOGASTRODUODENOSCOPY (EGD);  Surgeon: Unk Corinn Skiff, MD;  Location: [facility] ENDOSCOPY;  Service: Gastroenterology;  Laterality: N/A;   ESOPHAGOGASTRODUODENOSCOPY (EGD) WITH PROPOFOL  N/A 06/01/2017   Procedure: ESOPHAGOGASTRODUODENOSCOPY (EGD) WITH PROPOFOL ;  Surgeon: Therisa Bi, MD;  Location: [facility] ENDOSCOPY;  Service: Gastroenterology;  Laterality: N/A;   GIVENS CAPSULE STUDY N/A 10/17/2018   Procedure: GIVENS CAPSULE STUDY;  Surgeon: Unk Corinn Skiff, MD;  Location: [facility] ENDOSCOPY;  Service: Gastroenterology;  Laterality: N/A;   GIVENS CAPSULE STUDY N/A 08/02/2019   Procedure: GIVENS CAPSULE STUDY;  Surgeon: Unk Corinn Skiff, MD;  Location: [facility] ENDOSCOPY;  Service: Gastroenterology;  Laterality: N/A;   IR BONE MARROW BIOPSY & ASPIRATION  05/18/2023   none       reports that he has never smoked. He has never used smokeless tobacco. He reports current alcohol  use of about 21.0 standard drinks of alcohol  per week. He reports that he does not use drugs.  Allergies  Allergen Reactions   Lisinopril  Swelling   Losartan  Swelling    Family History  Problem Relation Age of Onset   Rheum  arthritis Mother    Diabetes Father    Heart disease Father    Cancer Father    COPD Sister    Rheum arthritis Sister    Diabetes Maternal Grandmother    Cancer Maternal Grandfather    Diabetes Paternal Grandmother    Cancer Paternal Grandfather     Prior to Admission medications   Medication  Sig Start Date End Date Taking? Authorizing Provider  allopurinol  (ZYLOPRIM ) 300 MG tablet Take 1 tablet (300 mg total) by mouth daily. 01/23/23  Yes Cannady, Jolene T, NP  atorvastatin  (LIPITOR) 40 MG tablet Take 1 tablet (40 mg total) by mouth daily. 01/23/23  Yes Cannady, Jolene T, NP  Cholecalciferol 50 MCG (2000 UT) CAPS Take 1 capsule by mouth daily.   Yes [provider]  colchicine  0.6 MG tablet Take 1 tablet (0.6 mg total) by mouth 2 (two) times daily. 07/29/23  Yes Melvin Pao, NP  cyanocobalamin  (VITAMIN B12) 1000 MCG tablet Take 1 tablet (1,000 mcg total) by mouth daily. 01/23/23  Yes Cannady, Jolene T, NP  magnesium  oxide (MAG-OX) 400 (240 Mg) MG tablet Take 1 tablet (400 mg total) by mouth 2 (two) times daily. 03/23/23  Yes Cannady, Jolene T, NP  ondansetron  (ZOFRAN ) 4 MG tablet Take 1 tablet (4 mg total) by mouth every 8 (eight) hours as needed. 12/09/22 12/09/23 Yes Malvina Alm DASEN, MD  pantoprazole  (PROTONIX ) 40 MG tablet Take 1 tablet (40 mg total) by mouth daily. 03/17/23  Yes Cannady, Jolene T, NP  propranolol  ER (INDERAL  LA) 60 MG 24 hr capsule Take 1 capsule (60 mg total) by mouth daily. 01/23/23  Yes Cannady, Jolene T, NP  tiZANidine  (ZANAFLEX ) 2 MG tablet TAKE 1 TABLET(2 MG) BY MOUTH EVERY 8 HOURS AS NEEDED FOR MUSCLE SPASMS 06/25/22  Yes Cannady, Jolene T, NP  Blood Glucose Monitoring Suppl (ONETOUCH VERIO) w/Device KIT Use to check blood sugar 3 times a day and document results, bring to appointments.  Goal is <130 fasting blood sugar and <180 two hours after meals. 07/24/22   Cannady, Jolene T, NP  copper  tablet Take 1 tablet (2 mg total) by mouth daily. For copper  deficiency Patient not taking: Reported on 11/04/2023 10/28/23   Dasie Tinnie MATSU, NP  gabapentin  (NEURONTIN ) 300 MG capsule Take 1 capsule (300 mg total) by mouth every morning AND 1 capsule (300 mg total) daily at 12 noon AND 2 capsules (600 mg total) at bedtime. Patient not taking: Reported on 11/04/2023  03/17/23   Cannady, Jolene T, NP  glucose blood (ONETOUCH VERIO) test strip Use to check blood sugar 3 times a day and document results, bring to appointments.  Goal is <130 fasting blood sugar and <180 two hours after meals. 07/24/22   Cannady, Jolene T, NP  Lancets (ONETOUCH ULTRASOFT) lancets Use to check blood sugar 3 times a day and document results, bring to appointments.  Goal is <130 fasting blood sugar and <180 two hours after meals. 07/24/22   Cannady, Jolene T, NP  Polyethyl Glycol-Propyl Glycol (SYSTANE) 0.4-0.3 % SOLN Apply 1 drop to eye 2 (two) times daily. Patient not taking: Reported on 11/04/2023 10/19/23   Zafirov, Clarissa A, MD  triamcinolone  cream (KENALOG ) 0.1 % Apply 1 application topically 2 (two) times daily. Patient not taking: Reported on 11/04/2023 01/14/21   Nedra Tinnie LABOR, NP  VOLTAREN 1 % GEL  03/04/20   [provider]    Physical Exam: Vitals:   11/04/23 0919 11/04/23 1300  BP: (!) 147/85  Pulse: (!) 102   Resp: 20   Temp: 98.8 F (37.1 C)   TempSrc: Oral   SpO2: 95%   Weight:  93.5 kg    Physical Exam   Constitutional: Alert, awake, calm, comfortable HEENT: Neck supple Respiratory: Clear to auscultation B/L, no wheezing, no rales.  Cardiovascular: Regular rate and rhythm, no murmurs / rubs / gallops. No extremity edema. 2+ pedal pulses. No carotid bruits.  Abdomen: Soft, no tenderness, Bowel sounds positive.  Musculoskeletal: no clubbing / cyanosis. Good ROM, no contractures. Normal muscle tone.  Skin: no rashes, lesions, ulcers. Neurologic: CN 2-12 grossly intact. Sensation intact, No focal deficit identified Psychiatric: Alert and oriented x 3. Normal mood.    Labs on Admission: I have personally reviewed following labs and imaging studies  CBC: Recent Labs  Lab 11/04/23 0921  WBC 3.9*  HGB 10.0*  HCT 30.4*  MCV 95.0  PLT 121*   Basic Metabolic Panel: Recent Labs  Lab 11/04/23 0921  NA 134*  K 2.7*  CL 96*  CO2 24   GLUCOSE 181*  BUN 12  CREATININE 1.01  CALCIUM  8.5*  MG 1.2*   GFR: Estimated Creatinine Clearance: 83.7 mL/min (by C-G formula based on SCr of 1.01 mg/dL). Liver Function Tests: Recent Labs  Lab 11/04/23 0921  AST 70*  ALT 46*  ALKPHOS 89  BILITOT 0.9  PROT 7.3  ALBUMIN 3.5   Recent Labs  Lab 11/04/23 0921  LIPASE 41   No results for input(s): AMMONIA in the last 168 hours. Coagulation Profile: No results for input(s): INR, PROTIME in the last 168 hours. Cardiac Enzymes: No results for input(s): CKTOTAL, CKMB, CKMBINDEX, TROPONINI, TROPONINIHS in the last 168 hours. BNP (last 3 results) No results for input(s): BNP in the last 8760 hours. HbA1C: No results for input(s): HGBA1C in the last 72 hours. CBG: No results for input(s): GLUCAP in the last 168 hours. Lipid Profile: No results for input(s): CHOL, HDL, LDLCALC, TRIG, CHOLHDL, LDLDIRECT in the last 72 hours. Thyroid  Function Tests: No results for input(s): TSH, T4TOTAL, FREET4, T3FREE, THYROIDAB in the last 72 hours. Anemia Panel: No results for input(s): VITAMINB12, FOLATE, FERRITIN, TIBC, IRON , RETICCTPCT in the last 72 hours. Urine analysis:    Component Value Date/Time   COLORURINE AMBER (A) 11/04/2023 0921   APPEARANCEUR HAZY (A) 11/04/2023 0921   APPEARANCEUR Clear 07/09/2021 0917   LABSPEC 1.026 11/04/2023 0921   LABSPEC 1.009 09/19/2013 2009   PHURINE 5.0 11/04/2023 0921   GLUCOSEU NEGATIVE 11/04/2023 0921   GLUCOSEU Negative 09/19/2013 2009   HGBUR NEGATIVE 11/04/2023 0921   BILIRUBINUR SMALL (A) 11/04/2023 0921   BILIRUBINUR Negative 07/09/2021 0917   BILIRUBINUR Negative 09/19/2013 2009   KETONESUR 5 (A) 11/04/2023 0921   PROTEINUR 100 (A) 11/04/2023 0921   NITRITE NEGATIVE 11/04/2023 0921   LEUKOCYTESUR NEGATIVE 11/04/2023 0921   LEUKOCYTESUR Negative 09/19/2013 2009    Radiological Exams on Admission: I have personally reviewed  images CT ABDOMEN PELVIS W CONTRAST Result Date: 11/04/2023 CLINICAL DATA:  Left lower quadrant abdominal pain.  Diarrhea. EXAM: CT ABDOMEN AND PELVIS WITH CONTRAST TECHNIQUE: Multidetector CT imaging of the abdomen and pelvis was performed using the standard protocol following bolus administration of intravenous contrast. RADIATION DOSE REDUCTION: This exam was performed according to the departmental dose-optimization program which includes automated exposure control, adjustment of the mA and/or kV according to patient size and/or use of iterative reconstruction technique. CONTRAST:  OMNIPAQUE  IOHEXOL  300 MG/ML  SOLN COMPARISON:  12/09/2022 FINDINGS:  Lower chest: Chronic volume loss at the right lung base particularly in the right middle lobe. Chronic scarring along the medial lung bases bilaterally. No large pleural effusions. Coronary artery calcifications. Hepatobiliary: Diffuse low-density in the liver is compatible with hepatic steatosis. No acute abnormality to the gallbladder. Main portal venous system is patent. No biliary dilatation. Pancreas: Unremarkable. No pancreatic ductal dilatation or surrounding inflammatory changes. Spleen: Normal in size without focal abnormality. Adrenals/Urinary Tract: Normal adrenal glands. Stable appearance of both kidneys with mild perinephric edema or stranding. No suspicious renal lesions. No hydronephrosis. No acute abnormality to the urinary bladder. Stomach/Bowel: Normal appearance of the stomach. No bowel dilatation or obstruction. Scattered colonic diverticula without acute bowel inflammation. Normal appendix. Vascular/Lymphatic: Atherosclerotic calcifications involving the abdominal aorta without aneurysm. Main visceral arteries are patent. No significant lymph node enlargement in the abdomen or pelvis. Reproductive: Prostate is unremarkable. Other: Small right inguinal hernia containing fat. Negative for free fluid. Negative for free air. Musculoskeletal: No  acute bone abnormality. Disc space narrowing at L4-L5 and L5-S1. IMPRESSION: 1. No acute abnormality in the abdomen or pelvis. 2. Hepatic steatosis. 3. Chronic volume loss at the right lung base. 4. Aortic Atherosclerosis (ICD10-I70.0). 5. Multiple colonic diverticula.  No acute bowel inflammation. Electronically Signed   By: Juliene Balder M.D.   On: 11/04/2023 12:23    EKG: My personal interpretation of EKG shows: Sinus rhythm    Assessment/Plan Principal Problem:   Diarrhea Active Problems:   Sideroblastic anemia (HCC)   Chronic gout without tophus   Gastroesophageal reflux disease   Type 2 diabetes mellitus with proteinuria (HCC)   Hypomagnesemia    Assessment and Plan:  63 year old male with history of diabetes, HTN, HLD, GERD who was brought into ED for nausea, abdominal pain and diarrhea for a week and a half.  1.  Nausea/abdominal pain, diarrhea - It appears that patient has diarrhea also with some nausea and abdominal pain. - Patient reported that he ate a hamburger prior to those symptoms. - Will continue IV fluid, antinausea medication, pain discomfort.  2.  Severe hypokalemia/hypomagnesemia - Will replace potassium and magnesium  - Will check potassium or magnesium  level.  3.  Diabetes type 2 - Patient does not take any medications at this point. - Will place him on sliding scale.  4.  Chronic sideroblastic anemia - Continue to monitor hemoglobin hematocrit - Patient does not have any drop in hemoglobin from previous number  5.  HTN/HLD/GERD -Resume home medication  6. Gout -Resume home meds   DVT prophylaxis: Lovenox  Code Status: Full Code Family Communication: None  Disposition Plan: Home  Consults called: None  Admission status: Observation, Telemetry bed   Nena Rebel, MD [facility] 11/04/2023, 1:43 PM

## 2023-11-05 DIAGNOSIS — E876 Hypokalemia: Secondary | ICD-10-CM | POA: Diagnosis not present

## 2023-11-05 DIAGNOSIS — R197 Diarrhea, unspecified: Secondary | ICD-10-CM | POA: Diagnosis not present

## 2023-11-05 DIAGNOSIS — K591 Functional diarrhea: Secondary | ICD-10-CM

## 2023-11-05 LAB — COMPREHENSIVE METABOLIC PANEL WITH GFR
ALT: 35 U/L (ref 0–44)
AST: 44 U/L — ABNORMAL HIGH (ref 15–41)
Albumin: 3 g/dL — ABNORMAL LOW (ref 3.5–5.0)
Alkaline Phosphatase: 59 U/L (ref 38–126)
Anion gap: 14 (ref 5–15)
BUN: 8 mg/dL (ref 8–23)
CO2: 23 mmol/L (ref 22–32)
Calcium: 8.2 mg/dL — ABNORMAL LOW (ref 8.9–10.3)
Chloride: 101 mmol/L (ref 98–111)
Creatinine, Ser: 0.81 mg/dL (ref 0.61–1.24)
GFR, Estimated: >60 mL/min (ref >60)
Glucose, Bld: 113 mg/dL — ABNORMAL HIGH (ref 70–99)
Potassium: 3 mmol/L — ABNORMAL LOW (ref 3.5–5.1)
Sodium: 138 mmol/L (ref 135–145)
Total Bilirubin: 1 mg/dL (ref 0.0–1.2)
Total Protein: 6.3 g/dL — ABNORMAL LOW (ref 6.5–8.1)

## 2023-11-05 LAB — CBC
HCT: 29.9 % — ABNORMAL LOW (ref 39.0–52.0)
Hemoglobin: 9.6 g/dL — ABNORMAL LOW (ref 13.0–17.0)
MCH: 30.5 pg (ref 26.0–34.0)
MCHC: 32.1 g/dL (ref 30.0–36.0)
MCV: 94.9 fL (ref 80.0–100.0)
Platelets: 119 K/uL — ABNORMAL LOW (ref 150–400)
RBC: 3.15 MIL/uL — ABNORMAL LOW (ref 4.22–5.81)
RDW: 16.1 % — ABNORMAL HIGH (ref 11.5–15.5)
WBC: 3.2 K/uL — ABNORMAL LOW (ref 4.0–10.5)
nRBC: 0 % (ref 0.0–0.2)

## 2023-11-05 LAB — MAGNESIUM: Magnesium: 1.4 mg/dL — ABNORMAL LOW (ref 1.7–2.4)

## 2023-11-05 LAB — PROTIME-INR
INR: 1 (ref 0.8–1.2)
Prothrombin Time: 14.2 s (ref 11.4–15.2)

## 2023-11-05 LAB — HIV ANTIBODY (ROUTINE TESTING W REFLEX): HIV Screen 4th Generation wRfx: NONREACTIVE

## 2023-11-05 MED ORDER — MAGNESIUM SULFATE 2 GM/50ML IV SOLN
2.0000 g | Freq: Once | INTRAVENOUS | Status: AC
Start: 2023-11-05 — End: 2023-11-05
  Administered 2023-11-05: 2 g via INTRAVENOUS
  Filled 2023-11-05: qty 50

## 2023-11-05 MED ORDER — POTASSIUM CHLORIDE 10 MEQ/100ML IV SOLN
10.0000 meq | INTRAVENOUS | Status: AC
  Administered 2023-11-05 (×6): 10 meq via INTRAVENOUS
  Filled 2023-11-05 (×6): qty 100

## 2023-11-05 MED ORDER — LOPERAMIDE HCL 2 MG PO TABS
2.0000 mg | ORAL_TABLET | Freq: Three times a day (TID) | ORAL | 0 refills | Status: AC | PRN
Start: 2023-11-05 — End: 2023-11-08

## 2023-11-05 MED ORDER — POTASSIUM CHLORIDE CRYS ER 20 MEQ PO TBCR
40.0000 meq | EXTENDED_RELEASE_TABLET | Freq: Once | ORAL | Status: AC
Start: 2023-11-05 — End: 2023-11-05
  Administered 2023-11-05: 40 meq via ORAL
  Filled 2023-11-05: qty 2

## 2023-11-05 NOTE — Progress Notes (Signed)
 PROGRESS NOTE George Gomez    DOB: 12-Jul-1960, 63 y.o.  FMW:[id number]    Code Status: Full Code   DOA: 11/04/2023   LOS: 0  Brief hospital course  George Gomez is a 63 y.o. male with a PMH significant for Clostridium difficile diarrhea, anemia, B12 deficiency, DM off medication, HTN, HLD, chronic melena who presented to ED nausea, abdominal pain, diarrhea 1-1/2 weeks.   ED Course: hypokalemic at 2.7, creatinine 1.01, magnesium  1.2.  Digital exam in the emergency room by Dr. Suzanne did not reveal any blood, stool was brown. CT abdomen did not show any acute abnormalities.  GI panel negative. Cdiff Ab only was positive.  They were initially treated with potassium and IVF.   11/05/23 -improved frequency and consistency of stool. Patient wants to go home tomorrow  Assessment & Plan  Principal Problem:   Diarrhea Active Problems:   Sideroblastic anemia (HCC)   Chronic gout without tophus   Gastroesophageal reflux disease   Type 2 diabetes mellitus with proteinuria (HCC)   Hypomagnesemia   Nausea/abdominal pain, diarrhea- improving. Tolerating food. Stool frequency has decreased. GI panel negative. No recent Abx use, only cdiff Ab positive which has been chronically since remote infection.  - supportive care with hydration and electrolyte replacement as needed   Severe hypokalemia/hypomagnesemia- improving - continue to replace and recheck am   Diabetes type 2- diet controlled. Stop sliding scale   Chronic sideroblastic anemia - Continue to monitor hemoglobin hematocrit - Patient does not have any drop in hemoglobin from previous number   HTN/HLD/GERD -Resume home medication   Gout -Resume home meds  Body mass index is 29.58 kg/m.  VTE ppx: enoxaparin  (LOVENOX ) injection 40 mg Start: 11/04/23 2200 SCDs Start: 11/04/23 1337  Diet:     Diet   Diet regular Room service appropriate? Yes; Fluid consistency: Thin   Consultants: None   Subjective 11/05/23    Pt reports  feeling better. Less stools overnight. No nausea currently. Tolerated breakfast. Has sensation that he has to have a BM during our encounter   Objective  Blood pressure 131/80, pulse 74, temperature 98.6 F (37 C), resp. rate 18, weight 93.5 kg, SpO2 98%.  Intake/Output Summary (Last 24 hours) at 11/05/2023 0713 Last data filed at 11/05/2023 0456 Gross per 24 hour  Intake 1439.91 ml  Output 1300 ml  Net 139.91 ml   Filed Weights   11/04/23 1300  Weight: 93.5 kg    Physical Exam:  General: awake, alert, NAD HEENT: atraumatic, clear conjunctiva, anicteric sclera, MMM, hearing grossly normal Respiratory: normal respiratory effort. Cardiovascular: extremities well perfused, quick capillary refill, normal S1/S2, RRR, no JVD, murmurs Gastrointestinal: soft, NT, ND Nervous: A&O x3. no gross focal neurologic deficits, normal speech Extremities: moves all equally, no edema, normal tone Skin: dry, intact, normal temperature, normal color. No rashes, lesions or ulcers on exposed skin Psychiatry: normal mood, congruent affect  Labs   I have personally reviewed the following labs and imaging studies CBC    Component Value Date/Time   WBC 3.2 (L) 11/05/2023 0552   RBC 3.15 (L) 11/05/2023 0552   HGB 9.6 (L) 11/05/2023 0552   HGB 10.5 (L) 09/10/2023 1013   HGB 9.0 (L) 03/23/2023 1014   HCT 29.9 (L) 11/05/2023 0552   HCT 28.1 (L) 03/23/2023 1014   PLT 119 (L) 11/05/2023 0552   PLT 160 09/10/2023 1013   PLT 193 03/23/2023 1014   MCV 94.9 11/05/2023 0552   MCV 98 (H) 03/23/2023 1014  MCV 91 09/19/2013 2009   MCH 30.5 11/05/2023 0552   MCHC 32.1 11/05/2023 0552   RDW 16.1 (H) 11/05/2023 0552   RDW 17.5 (H) 03/23/2023 1014   RDW 15.5 (H) 09/19/2013 2009   LYMPHSABS 0.9 09/10/2023 1013   LYMPHSABS 0.8 03/23/2023 1014   LYMPHSABS 0.8 (L) 08/02/2013 0634   MONOABS 1.0 09/10/2023 1013   MONOABS 0.4 08/02/2013 0634   EOSABS 0.0 09/10/2023 1013   EOSABS 0.1 03/23/2023 1014   EOSABS 0.1  08/02/2013 0634   BASOSABS 0.0 09/10/2023 1013   BASOSABS 0.0 03/23/2023 1014   BASOSABS 0.0 08/02/2013 0634      Latest Ref Rng & Units 11/05/2023    5:52 AM 11/04/2023    2:03 PM 11/04/2023    9:21 AM  BMP  Glucose 70 - 99 mg/dL 886   818   BUN 8 - 23 mg/dL 8   12   Creatinine 9.38 - 1.24 mg/dL 9.18  9.16  8.98   Sodium 135 - 145 mmol/L 138   134   Potassium 3.5 - 5.1 mmol/L 3.0   2.7   Chloride 98 - 111 mmol/L 101   96   CO2 22 - 32 mmol/L 23   24   Calcium  8.9 - 10.3 mg/dL 8.2   8.5     CT ABDOMEN PELVIS W CONTRAST Result Date: 11/04/2023 CLINICAL DATA:  Left lower quadrant abdominal pain.  Diarrhea. EXAM: CT ABDOMEN AND PELVIS WITH CONTRAST TECHNIQUE: Multidetector CT imaging of the abdomen and pelvis was performed using the standard protocol following bolus administration of intravenous contrast. RADIATION DOSE REDUCTION: This exam was performed according to the departmental dose-optimization program which includes automated exposure control, adjustment of the mA and/or kV according to patient size and/or use of iterative reconstruction technique. CONTRAST:  OMNIPAQUE  IOHEXOL  300 MG/ML  SOLN COMPARISON:  12/09/2022 FINDINGS: Lower chest: Chronic volume loss at the right lung base particularly in the right middle lobe. Chronic scarring along the medial lung bases bilaterally. No large pleural effusions. Coronary artery calcifications. Hepatobiliary: Diffuse low-density in the liver is compatible with hepatic steatosis. No acute abnormality to the gallbladder. Main portal venous system is patent. No biliary dilatation. Pancreas: Unremarkable. No pancreatic ductal dilatation or surrounding inflammatory changes. Spleen: Normal in size without focal abnormality. Adrenals/Urinary Tract: Normal adrenal glands. Stable appearance of both kidneys with mild perinephric edema or stranding. No suspicious renal lesions. No hydronephrosis. No acute abnormality to the urinary bladder. Stomach/Bowel:  Normal appearance of the stomach. No bowel dilatation or obstruction. Scattered colonic diverticula without acute bowel inflammation. Normal appendix. Vascular/Lymphatic: Atherosclerotic calcifications involving the abdominal aorta without aneurysm. Main visceral arteries are patent. No significant lymph node enlargement in the abdomen or pelvis. Reproductive: Prostate is unremarkable. Other: Small right inguinal hernia containing fat. Negative for free fluid. Negative for free air. Musculoskeletal: No acute bone abnormality. Disc space narrowing at L4-L5 and L5-S1. IMPRESSION: 1. No acute abnormality in the abdomen or pelvis. 2. Hepatic steatosis. 3. Chronic volume loss at the right lung base. 4. Aortic Atherosclerosis (ICD10-I70.0). 5. Multiple colonic diverticula.  No acute bowel inflammation. Electronically Signed   By: Juliene Balder M.D.   On: 11/04/2023 12:23    Disposition Plan & Communication  Patient status: Observation  Admitted From: Home Planned disposition location: Home Anticipated discharge date: 10/10 pending clinical improvement   Family Communication: none at bedside    Author: Marien LITTIE Piety, DO [facility] 11/05/2023, 7:13 AM   Available by Epic secure  chat 7AM-7PM. If 7PM-7AM, please contact night-coverage.  [facility] contact information found on [email or web address].

## 2023-11-05 NOTE — Discharge Instructions (Signed)
 I have sent in imodium  prescription to help relieve your diarrhea. I recommend you stop your magnesium  supplement at home as that can increase your diarrhea symptoms.  Please follow up with your primary doctor in 2 weeks to recheck your blood work and sooner if your symptoms worsen or you are unable to stay well hydrated by drinking fluids.

## 2023-11-05 NOTE — Care Management Obs Status (Signed)
 MEDICARE OBSERVATION STATUS NOTIFICATION   Patient Details  Name: George Gomez MRN: [id number] Date of Birth: 08/28/1960   Medicare Observation Status Notification Given:  Yes    Jahmeer Porche W, CMA 11/05/2023, 11:32 AM

## 2023-11-05 NOTE — Care Management Obs Status (Signed)
 MEDICARE OBSERVATION STATUS NOTIFICATION   Patient Details  Name: Saveon Plant MRN: [id number] Date of Birth: 08/28/1960   Medicare Observation Status Notification Given:  Yes    Jahmeer Porche W, CMA 11/05/2023, 11:32 AM

## 2023-11-05 NOTE — TOC Initial Note (Signed)
 Transition of Care [facility] - Initial/Assessment Note    Patient Details  Name: George Gomez MRN: [id number] Date of Birth: 08-Jan-1961  Transition of Care [facility]) CM/SW Contact:    Marinda Cooks, RN Phone Number: 11/05/2023, 4:15 PM  Clinical Narrative:                 This CM spoke with pt introduced role  and completed Initial assessement. Pt A&Ox 4   Pt reports living in a 2  bedroom small ranch style home with  1 steps to enter. Pt shared he  has family support provided by mother siblings . Pt confirmed he was independent with all ADL's prior to this admission.Pt uses  local [facility] pharmacy & PCP is Dr. Jolene Cannady . Pt reports having DME at home that includes  a cane . Pt reports not  being having any SNF stays  prior to  this admission within the yr or  [facility] coordinated. Pt confirmed receiving food stamps monthly in the amount of $ 140 and shared he receives retirement currently as his source of income.  Pt confirmed he drives as his main source of transportation.TOC will cont to follow pt's dc planning / care coordination during hospital stay and update as applicable.      Expected Discharge Plan and Services    TBD      Expected Discharge Date: 11/05/23                   Prior Living Arrangements/Services    Home   Activities of Daily Living  Independent at baseline per pt               Emotional Assessment              Admission diagnosis:  Diarrhea [R19.7] Hypokalemia [E87.6] Hypomagnesemia [E83.42] Diarrhea, unspecified type [R19.7] Patient Active Problem List   Diagnosis Date Noted   Hypokalemia 11/05/2023   Diarrhea 11/04/2023   Epiphora due to excess lacrimation 10/19/2023   Eye irritation 10/19/2023   Effusion of right knee 09/15/2023   Elbow pain, left 03/17/2023   Aortic atherosclerosis 12/16/2022   Hepatic steatosis 12/16/2022   History of angioedema 12/16/2022   CKD stage 3 due to type 2 diabetes mellitus (HCC) 01/01/2022   Right hip pain  01/01/2022   Hypomagnesemia 12/15/2021   Type 2 diabetes mellitus with proteinuria (HCC) 07/09/2021   Hyperlipidemia due to type 2 diabetes mellitus (HCC) 07/19/2020   Hypertension associated with type 2 diabetes mellitus (HCC) 07/19/2020   Chronic gout without tophus 07/19/2020   Gastroesophageal reflux disease 07/19/2020   Type 2 diabetes mellitus with diabetic neuropathy (HCC) 07/19/2020   Eczema 07/19/2020   Elevated LFTs 08/23/2019   B12 deficiency 12/11/2017   Sideroblastic anemia (HCC) 12/03/2017   Thrombocytopenia 12/03/2017   Alcohol  abuse 03/20/2016   PCP:  Valerio Melanie DASEN, NP Pharmacy:   [facility] [phone] - [place], [place] - [address] AT [facility] OF [address] [place] [address] Phone: [phone] Fax: [phone]     Social Drivers of Health (SDOH) Social History: SDOH Screenings   Food Insecurity: No Food Insecurity (11/05/2023)  Housing: Unknown (11/05/2023)  Transportation Needs: No Transportation Needs (11/05/2023)  Utilities: Not At Risk (11/05/2023)  Alcohol  Screen: High Risk (03/10/2023)  Depression (PHQ2-9): Low Risk  (09/15/2023)  Recent Concern: Depression (PHQ2-9) - Medium Risk (08/18/2023)  Financial Resource Strain: Low Risk  (05/14/2023)  Physical Activity: Inactive (  05/14/2023)  Social Connections: Socially Isolated (11/05/2023)  Stress: No Stress Concern Present (05/14/2023)  Tobacco Use: Low Risk  (11/04/2023)  Health Literacy: Adequate Health Literacy (05/14/2023)   SDOH Interventions:     Readmission Risk Interventions     No data to display

## 2023-11-05 NOTE — Plan of Care (Signed)
  Problem: Safety: Goal: Ability to remain free from injury will improve Outcome: Progressing   Problem: Activity: Goal: Risk for activity intolerance will decrease Outcome: Progressing   Problem: Nutrition: Goal: Adequate nutrition will be maintained Outcome: Progressing

## 2023-11-05 NOTE — Progress Notes (Signed)
 Pt has been discharged to home. He is alert and oriented x 4. Able to ambulate with assistance of cane. Pt. Is continent of B&B. IV's to right and left AC have been removed. Gauze and tape applied to both sites. Staff to wheelchair pt to lobby.

## 2023-11-05 NOTE — Discharge Summary (Signed)
 Physician Discharge Summary  Patient: George Gomez FMW:[id number] DOB: 08/18/1960   Code Status: Full Code Admit date: 11/04/2023 Discharge date: 11/05/2023 Disposition: Home, No home health services recommended PCP: George Melanie DASEN, NP  Recommendations for Outpatient Follow-up:  Follow up with PCP within 1-2 weeks Regarding general hospital follow up and preventative care Recommend BMP, magnesium . On day of discharge- Mg++ 1.4 which was replaced and K+ was 3.0, also replaced Consider evaluation for functional diarrhea.   Discharge Diagnoses:  Principal Problem:   Diarrhea Active Problems:   Sideroblastic anemia (HCC)   Chronic gout without tophus   Gastroesophageal reflux disease   Type 2 diabetes mellitus with proteinuria (HCC)   Hypomagnesemia   Hypokalemia  Brief Hospital Course Summary: George Gomez is a 63 y.o. male with a PMH significant for Clostridium difficile diarrhea, anemia, B12 deficiency, DM off medication, HTN, HLD, chronic melena who presented to ED nausea, abdominal pain, diarrhea 1-1/2 weeks.   ED Course: hypokalemic at 2.7, creatinine 1.01, magnesium  1.2.  Digital exam in the emergency room by Dr. Suzanne did not reveal any blood, stool was brown. CT abdomen did not show any acute abnormalities.  GI panel negative. Cdiff Ab only was positive.   They were initially treated with potassium and IVF.   Nausea/abdominal pain, diarrhea- improving. Tolerating food. Stool frequency has decreased. GI panel negative. No recent Abx use, only cdiff Ab positive which has been chronically since remote infection.  - supportive care with hydration and electrolyte replacement as needed - imodium  PRN ordered for short course    hypokalemia/hypomagnesemia 2/2 above- improving. S/p replacement  All other chronic conditions were treated with home medications.    Discharge Condition: Stable, improved Recommended discharge diet: Regular healthy diet  Consultations: None    Procedures/Studies: None   Allergies as of 11/05/2023       Reactions   Lisinopril  Swelling   Losartan  Swelling        Medication List     STOP taking these medications    copper  tablet   gabapentin  300 MG capsule Commonly known as: NEURONTIN    magnesium  oxide 400 (240 Mg) MG tablet Commonly known as: MAG-OX   Systane 0.4-0.3 % Soln Generic drug: Polyethyl Glycol-Propyl Glycol   triamcinolone  cream 0.1 % Commonly known as: KENALOG    Voltaren 1 % Gel Generic drug: diclofenac Sodium       TAKE these medications    allopurinol  300 MG tablet Commonly known as: ZYLOPRIM  Take 1 tablet (300 mg total) by mouth daily.   atorvastatin  40 MG tablet Commonly known as: LIPITOR Take 1 tablet (40 mg total) by mouth daily.   B-12 1000 MCG Tabs Take 1 tablet (1,000 mcg total) by mouth daily.   Cholecalciferol 50 MCG (2000 UT) Caps Take 1 capsule by mouth daily.   colchicine  0.6 MG tablet Take 1 tablet (0.6 mg total) by mouth 2 (two) times daily.   loperamide  2 MG tablet Commonly known as: Imodium  A-D Take 1 tablet (2 mg total) by mouth 3 (three) times daily as needed for up to 3 days for diarrhea or loose stools.   ondansetron  4 MG tablet Commonly known as: Zofran  Take 1 tablet (4 mg total) by mouth every 8 (eight) hours as needed.   onetouch ultrasoft lancets Use to check blood sugar 3 times a day and document results, bring to appointments.  Goal is <130 fasting blood sugar and <180 two hours after meals.   OneTouch Verio test strip Generic drug: glucose blood Use  to check blood sugar 3 times a day and document results, bring to appointments.  Goal is <130 fasting blood sugar and <180 two hours after meals.   OneTouch Verio w/Device Kit Use to check blood sugar 3 times a day and document results, bring to appointments.  Goal is <130 fasting blood sugar and <180 two hours after meals.   pantoprazole  40 MG tablet Commonly known as: PROTONIX  Take 1 tablet  (40 mg total) by mouth daily.   propranolol  ER 60 MG 24 hr capsule Commonly known as: INDERAL  LA Take 1 capsule (60 mg total) by mouth daily.   tiZANidine  2 MG tablet Commonly known as: ZANAFLEX  TAKE 1 TABLET(2 MG) BY MOUTH EVERY 8 HOURS AS NEEDED FOR MUSCLE SPASMS        Follow-up Information     Cannady, Jolene T, NP. Schedule an appointment as soon as possible for a visit in 2 week(s).   Specialty: Nurse Practitioner Contact information: [address] [place] [address] [phone]                Subjective   Pt reports feeling well. Denies nausea or vomiting. Tolerating his diet well. No abdominal pain. Has had one episode of soft stool today.   All questions and concerns were addressed at time of discharge.  Objective  Blood pressure 136/79, pulse 69, temperature 97.8 F (36.6 C), resp. rate 17, weight 93.5 kg, SpO2 98%.   General: Pt is alert, awake, not in acute distress Cardiovascular: RRR, S1/S2 +, no rubs, no gallops Respiratory: CTA bilaterally, no wheezing, no rhonchi Abdominal: Soft, NT, ND, bowel sounds + Extremities: no edema, no cyanosis  The results of significant diagnostics from this hospitalization (including imaging, microbiology, ancillary and laboratory) are listed below for reference.   Imaging studies: CT ABDOMEN PELVIS W CONTRAST Result Date: 11/04/2023 CLINICAL DATA:  Left lower quadrant abdominal pain.  Diarrhea. EXAM: CT ABDOMEN AND PELVIS WITH CONTRAST TECHNIQUE: Multidetector CT imaging of the abdomen and pelvis was performed using the standard protocol following bolus administration of intravenous contrast. RADIATION DOSE REDUCTION: This exam was performed according to the departmental dose-optimization program which includes automated exposure control, adjustment of the mA and/or kV according to patient size and/or use of iterative reconstruction technique. CONTRAST:  OMNIPAQUE  IOHEXOL  300 MG/ML  SOLN COMPARISON:  12/09/2022  FINDINGS: Lower chest: Chronic volume loss at the right lung base particularly in the right middle lobe. Chronic scarring along the medial lung bases bilaterally. No large pleural effusions. Coronary artery calcifications. Hepatobiliary: Diffuse low-density in the liver is compatible with hepatic steatosis. No acute abnormality to the gallbladder. Main portal venous system is patent. No biliary dilatation. Pancreas: Unremarkable. No pancreatic ductal dilatation or surrounding inflammatory changes. Spleen: Normal in size without focal abnormality. Adrenals/Urinary Tract: Normal adrenal glands. Stable appearance of both kidneys with mild perinephric edema or stranding. No suspicious renal lesions. No hydronephrosis. No acute abnormality to the urinary bladder. Stomach/Bowel: Normal appearance of the stomach. No bowel dilatation or obstruction. Scattered colonic diverticula without acute bowel inflammation. Normal appendix. Vascular/Lymphatic: Atherosclerotic calcifications involving the abdominal aorta without aneurysm. Main visceral arteries are patent. No significant lymph node enlargement in the abdomen or pelvis. Reproductive: Prostate is unremarkable. Other: Small right inguinal hernia containing fat. Negative for free fluid. Negative for free air. Musculoskeletal: No acute bone abnormality. Disc space narrowing at L4-L5 and L5-S1. IMPRESSION: 1. No acute abnormality in the abdomen or pelvis. 2. Hepatic steatosis. 3. Chronic volume loss at the  right lung base. 4. Aortic Atherosclerosis (ICD10-I70.0). 5. Multiple colonic diverticula.  No acute bowel inflammation. Electronically Signed   By: Juliene Balder M.D.   On: 11/04/2023 12:23   Labs: Basic Metabolic Panel: Recent Labs  Lab 11/04/23 0921 11/04/23 1403 11/05/23 0552  NA 134*  --  138  K 2.7*  --  3.0*  CL 96*  --  101  CO2 24  --  23  GLUCOSE 181*  --  113*  BUN 12  --  8  CREATININE 1.01 0.83 0.81  CALCIUM  8.5*  --  8.2*  MG 1.2*  --  1.4*    CBC: Recent Labs  Lab 11/04/23 0921 11/04/23 1403 11/05/23 0552  WBC 3.9* 3.5* 3.2*  HGB 10.0* 9.2* 9.6*  HCT 30.4* 28.5* 29.9*  MCV 95.0 95.0 94.9  PLT 121* 109* 119*   Microbiology: Results for orders placed or performed during the hospital encounter of 11/04/23  Gastrointestinal Panel by PCR , Stool     Status: None   Collection Time: 11/04/23 11:36 AM   Specimen: STOOL  Result Value Ref Range Status   Campylobacter species NOT DETECTED NOT DETECTED Final   Plesimonas shigelloides NOT DETECTED NOT DETECTED Final   Salmonella species NOT DETECTED NOT DETECTED Final   Yersinia enterocolitica NOT DETECTED NOT DETECTED Final   Vibrio species NOT DETECTED NOT DETECTED Final   Vibrio cholerae NOT DETECTED NOT DETECTED Final   Enteroaggregative E coli (EAEC) NOT DETECTED NOT DETECTED Final   Enteropathogenic E coli (EPEC) NOT DETECTED NOT DETECTED Final   Enterotoxigenic E coli (ETEC) NOT DETECTED NOT DETECTED Final   Shiga like toxin producing E coli (STEC) NOT DETECTED NOT DETECTED Final   Shigella/Enteroinvasive E coli (EIEC) NOT DETECTED NOT DETECTED Final   Cryptosporidium NOT DETECTED NOT DETECTED Final   Cyclospora cayetanensis NOT DETECTED NOT DETECTED Final   Entamoeba histolytica NOT DETECTED NOT DETECTED Final   Giardia lamblia NOT DETECTED NOT DETECTED Final   Adenovirus F40/41 NOT DETECTED NOT DETECTED Final   Astrovirus NOT DETECTED NOT DETECTED Final   Norovirus GI/GII NOT DETECTED NOT DETECTED Final   Rotavirus A NOT DETECTED NOT DETECTED Final   Sapovirus (I, II, IV, and V) NOT DETECTED NOT DETECTED Final    Comment: Performed at [facility], [address] [place], [place] [address]  C Difficile Quick Screen w PCR reflex     Status: Abnormal   Collection Time: 11/04/23 11:36 AM   Specimen: STOOL  Result Value Ref Range Status   C Diff antigen POSITIVE (A) NEGATIVE Final   C Diff toxin NEGATIVE NEGATIVE Final   C Diff interpretation Results  are indeterminate. See PCR results.  Final    Comment: Performed at [facility], [address] [place], [place] [address]  C. Diff by PCR, Reflexed     Status: None   Collection Time: 11/04/23 11:36 AM  Result Value Ref Range Status   Toxigenic C. Difficile by PCR NEGATIVE NEGATIVE Final    Comment: Patient is colonized with non toxigenic C. difficile. May not need treatment unless significant symptoms are present.   Hypervirulent Strain PRESUMPTIVE NEGATIVE PRESUMPTIVE NEGATIVE Final    Comment: Performed at [facility], [address] [place], [place] [address]    Time coordinating discharge: Over 30 minutes  Marien LITTIE Piety, MD  [facility] 11/05/2023, 4:06 PM

## 2023-11-08 NOTE — Patient Instructions (Incomplete)
C. Diff Infection C. diffinfection, or C. diff, is an infection that is caused by C. diff germs. These germs, called bacteria, cause watery poop (diarrhea) and very bad inflammation in the colon. This infection often happens after taking antibiotics. C. diffcan spread easily to others. What are the causes? Taking certain antibiotics. Coming in contact with people, food, or things that have the germ on it. What increases the risk? Taking certain antibiotics for a long time. Staying in a hospital or nursing home for a long time. Being age 17 or older. Having had C. diff infection before or been exposed to C. diff. Having a weak disease-fighting, or immune, system. Having serious health problems, including: Colon cancer. Inflammatory bowel disease (IBD). Taking medicines that treat stomach acid. Having had surgery on your digestive system. What are the signs or symptoms? Watery poop. Fever. Feeling like you may vomit. Not feeling hungry. Swelling, pain, cramps, or a tender belly. How is this treated? Treatment may include: Stopping the antibiotics that caused the C. diff infection. Taking antibiotics that kill C. diff. Placing poop from a healthy person into your colon. This is called a fecal transplant. Having surgery to take out the infected part of the colon. This is rare. Follow these instructions at home: Medicines Take over-the-counter and prescription medicines only as told by your doctor. Take your antibiotics as told by your doctor. Do not stop taking them even if you start to feel better. Do not take medicines that treat watery poop unless your doctor tells you to. Eating and drinking Follow instructions from your  doctor about what you may eat and drink. Eat bland foods in small amounts, such as: Bananas. Applesauce. Rice. Toast. Avoid milk, caffeine, and alcohol. To prevent loss of fluid in your body, or dehydration: Drink clear fluids to keep your pee (urine) pale yellow. This includes water, ice chips, clear fruit juice with water added to it, or low-calorie sports drinks. Have a drink called an oral rehydration solution (ORS). You can buy this drink at pharmacies and retail stores. General instructions Wash your hands often with soap and water. Do this for at least 20 seconds. Take a bath or shower every day. Return to your normal activities when your doctor says that it's safe. Be sure your home is clean before you leave the hospital or clinic. Clean every day for at least a week. Keep all follow-up visits to make sure your infection is gone. How is this prevented? Wash [employer] your hands often with soap and water for at least 20 seconds. Wash your hands before you cook and after you use the bathroom. Other people should wash their hands too, especially: People who live with you. People who visit you  in a hospital or clinic. Stop germs from spreading Tell your doctor if you get watery poop while you are in the hospital or nursing home. When you visit someone in the hospital or nursing home, wear a gown, gloves, or other protection. Try to: Stay away from people who have watery poop. Use a different bathroom if you're sick and live with other people. Clean surfaces Clean surfaces that you touch every day. Use a product that has a 10% chlorine bleach solution. Be sure to: Read the product label to make sure that the product will kill the germs on your surfaces. Clean toilets and flush handles, bathtubs, sinks, doorknobs and handles, countertops, and work surfaces. If you're in the hospital, make sure the surfaces in your room are cleaned each day. Tell someone right away if body fluids  have splashed or spilled. Wash clothes and linens Wash clothes and linens using laundry soap that has chlorine bleach. Be sure to: Use powder soap instead of liquid. Clean your washing machine once a month. To do this, turn on the hot setting with only soap in it. Contact a doctor if: Your symptoms do not get better or get worse. Your symptoms go away and then come back. You have a fever. You have new symptoms. Get help right away if: Your belly is more tender or painful. Your poop is bloody. Your poop looks black. You vomit every time you eat or drink. You have signs of not having enough fluids in your body. These include: Dark yellow pee, very little pee, or no pee. Cracked lips or dry mouth. No tears when you cry. Sunken eyes. Feeling tired. Feeling weak or dizzy. This information is not intended to replace advice given to you by your health care provider. Make sure you discuss any questions you have with your health care provider. Document Revised: 04/07/2022 Document Reviewed: 04/07/2022 Elsevier Patient Education  2024 [employer].

## 2023-11-09 ENCOUNTER — Other Ambulatory Visit: Payer: Self-pay

## 2023-11-09 DIAGNOSIS — D643 Other sideroblastic anemias: Secondary | ICD-10-CM

## 2023-11-09 DIAGNOSIS — D509 Iron deficiency anemia, unspecified: Secondary | ICD-10-CM

## 2023-11-10 ENCOUNTER — Inpatient Hospital Stay: Admitting: Oncology

## 2023-11-10 ENCOUNTER — Encounter: Payer: Self-pay | Admitting: Oncology

## 2023-11-10 ENCOUNTER — Inpatient Hospital Stay: Attending: Oncology

## 2023-11-10 ENCOUNTER — Inpatient Hospital Stay

## 2023-11-10 VITALS — BP 149/81 | HR 71 | Temp 98.9°F | Resp 19 | Ht 70.0 in | Wt 207.5 lb

## 2023-11-10 DIAGNOSIS — D509 Iron deficiency anemia, unspecified: Secondary | ICD-10-CM

## 2023-11-10 DIAGNOSIS — D649 Anemia, unspecified: Secondary | ICD-10-CM | POA: Diagnosis present

## 2023-11-10 DIAGNOSIS — D643 Other sideroblastic anemias: Secondary | ICD-10-CM | POA: Diagnosis not present

## 2023-11-10 LAB — CBC (CANCER CENTER ONLY)
HCT: 30.4 % — ABNORMAL LOW (ref 39.0–52.0)
Hemoglobin: 9.9 g/dL — ABNORMAL LOW (ref 13.0–17.0)
MCH: 31.1 pg (ref 26.0–34.0)
MCHC: 32.6 g/dL (ref 30.0–36.0)
MCV: 95.6 fL (ref 80.0–100.0)
Platelet Count: 207 K/uL (ref 150–400)
RBC: 3.18 MIL/uL — ABNORMAL LOW (ref 4.22–5.81)
RDW: 16.7 % — ABNORMAL HIGH (ref 11.5–15.5)
WBC Count: 4.7 K/uL (ref 4.0–10.5)
nRBC: 1.1 % — ABNORMAL HIGH (ref 0.0–0.2)

## 2023-11-10 LAB — VITAMIN B12: Vitamin B-12: 567 pg/mL (ref 180–914)

## 2023-11-12 ENCOUNTER — Encounter: Payer: Self-pay | Admitting: Hematology and Oncology

## 2023-11-12 NOTE — Progress Notes (Signed)
 Hematology/Oncology Consult note [facility]  Telephone:([phone] [phone]  Patient Care Team: Cannady, Jolene T, NP as PCP - General (Nurse Practitioner) Unk Corinn Skiff, MD as Consulting Physician (Gastroenterology) Melanee Annah BROCKS, MD as Consulting Physician (Oncology) Onita Elspeth Sharper, DO as Consulting Physician (Gastroenterology) Ermalinda Lenn [place], [place] as [facility] [place], [place], [place]   Name of the patient: George Gomez Gomez  [id number]  Jun 26, 1960   Date of visit: 11/12/23  Diagnosis-anemia alcohol  versus MDS  Chief complaint/ Reason for visit-routine follow-up of anemia  Heme/Onc history: Patient is a 63 year old African-American male who is transferring his care from Dr. Rudell to me.He has been followed for his pancytopenia but mainly anemia.  Hemoglobin has remained around 10 for the last 3 years.  He also has mild intermittent leukopenia with a white count that fluctuates between 3.3-4 mainly neutropenia.  Platelet counts have been mostly normal except for intermittent mild low platelet counts between 120s to 140s.  He has prior history of B12 deficiency and he gets monthly B12 injections.  History of folate deficiency but of late that has been normal.  He had a flow cytometry in December 2020 that was normal.  Anemia work-up including TSH copper  zinc  ANA hepatitis testing and HIV testing was negative.Patient has had an elevated ferritin which has been again a longstanding issue and his ferritin levels fluctuate between [id number].  He had HFE gene testing done in the past which was negative.LFTs have shown mildly elevated AST and ALT intermittently but no hyperbilirubinemia.MR eneterogram in October 2021 showed normal liver size and configuration.  No splenomegaly.    BM biopsy on 4/21 showed : While the aspirate smear slides are somewhat suboptimal given limited spicules, the red blood cell morphology shows a  prominent erythroid hyperplasia with left shift with vascularization and prominent basophilic stippling with siderotic granules.  The siderotic granules and basophilic stippling are also prominently noted in the peripheral blood.  While there is mild nuclear irregularity in the later nucleated red blood cell precursors this finding is not prominent.  There is a subset of red blood cell vacuolization the iron  stain shows prominent ring sideroblasts the overall marrow cellularity is normocellular to mildly hypercellular, predominantly secondary to this relative to absolute erythroid hyperplasia with left shift.  The myeloid series appears to show a relative at to absolute hypoplasia but with only borderline leukopenia/neutropenia in the peripheral blood.  While the megakaryocytes are limited on aspirate they are increased on the biopsy and show a subset of forms that show separated nuclei (i.e. palm balls) as well as some abnormal lobulation suggestive of megakaryocytic dyspoiesis.  In addition, there are large/giant platelets in the peripheral blood.  The above findings are consistent with a sideroblastic anemia.  While definitive dysplasia is not identified for definitive diagnosis of a myelodysplastic syndrome, it is suggested in the megakaryocytic lineage as well as the erythroid lineage with the ring sideroblasts; however, while the ring sideroblasts as well as the presence of  vacuolization suggests possible red blood cell dysplasia these findings can also be seen in toxicities as well as in defective heme biosynthesis/mitochondrial dysfunction; however most of these forms are congenital and are identified by at least early adulthood.  A myelodysplastic syndrome therefore is the most likely culprit     Interval history- Discussed the use of AI scribe software for clinical note transcription with the patient, who gave verbal consent to proceed.  History of Present Illness  [name] is a 63 year old male with anemia who presents for a follow-up visit.  He has a history of anemia with hemoglobin levels typically around 10 g/dL. Earlier this year, his hemoglobin dropped to the 8 g/dL range, prompting a bone marrow biopsy. Since then, he has reduced his alcohol  intake to about a pint a day, consumed with meals. His hemoglobin has improved to 9.9 g/dL, compared to 1.7-1.4 g/dL seven months ago.  He denies falls and generalized aches and pains but has persistent swelling in his knee that has not resolved. His appetite is good.  He was hospitalized last week due to diarrhea, which he attributes to a viral infection. He reports feeling better now.       ECOG PS- 1 Pain scale- 0   Review of systems- Review of Systems  Constitutional:  Positive for malaise/fatigue. Negative for chills, fever and weight loss.  HENT:  Negative for congestion, ear discharge and nosebleeds.   Eyes:  Negative for blurred vision.  Respiratory:  Negative for cough, hemoptysis, sputum production, shortness of breath and wheezing.   Cardiovascular:  Negative for chest pain, palpitations, orthopnea and claudication.  Gastrointestinal:  Negative for abdominal pain, blood in stool, constipation, diarrhea, heartburn, melena, nausea and vomiting.  Genitourinary:  Negative for dysuria, flank pain, frequency, hematuria and urgency.  Musculoskeletal:  Negative for back pain, joint pain and myalgias.  Skin:  Negative for rash.  Neurological:  Negative for dizziness, tingling, focal weakness, seizures, weakness and headaches.  Endo/Heme/Allergies:  Does not bruise/bleed easily.  Psychiatric/Behavioral:  Negative for depression and suicidal ideas. The patient does not have insomnia.       Allergies  Allergen Reactions   Lisinopril  Swelling   Losartan  Swelling     Past Medical History:  Diagnosis Date   Acute on chronic renal failure 03/20/2016   Acute renal failure 10/12/2018   Acute  renal failure (ARF) 08/28/2016   Anemia    B12 deficiency 12/11/2017   Clostridium difficile diarrhea 03/18/2016   Diabetes mellitus without complication (HCC)    Diverticulitis    Pt states diverticulitis   EtOH dependence (HCC)    Folate deficiency 07/25/2018   GI bleed 05/30/2017   Hypercholesteremia    Hypertension    Melena 10/21/2018   Weight loss 09/15/2018     Past Surgical History:  Procedure Laterality Date   COLONOSCOPY WITH PROPOFOL  N/A 10/16/2018   Procedure: COLONOSCOPY WITH PROPOFOL ;  Surgeon: Unk Corinn Skiff, MD;  Location: [facility] ENDOSCOPY;  Service: Gastroenterology;  Laterality: N/A;   ENTEROSCOPY N/A 10/16/2018   Procedure: ENTEROSCOPY;  Surgeon: Unk Corinn Skiff, MD;  Location: [facility] ENDOSCOPY;  Service: Gastroenterology;  Laterality: N/A;   ESOPHAGOGASTRODUODENOSCOPY N/A 07/02/2018   Procedure: ESOPHAGOGASTRODUODENOSCOPY (EGD);  Surgeon: Unk Corinn Skiff, MD;  Location: [facility] ENDOSCOPY;  Service: Gastroenterology;  Laterality: N/A;   ESOPHAGOGASTRODUODENOSCOPY (EGD) WITH PROPOFOL  N/A 06/01/2017   Procedure: ESOPHAGOGASTRODUODENOSCOPY (EGD) WITH PROPOFOL ;  Surgeon: Therisa Bi, MD;  Location: [facility] ENDOSCOPY;  Service: Gastroenterology;  Laterality: N/A;   GIVENS CAPSULE STUDY N/A 10/17/2018   Procedure: GIVENS CAPSULE STUDY;  Surgeon: Unk Corinn Skiff, MD;  Location[facility] ENDOSCOPY;  Service: Gastroenterology;  Laterality: N/A;   GIVENS CAPSULE STUDY N/A 08/02/2019   Procedure: GIVENS CAPSULE STUDY;  Surgeon: Unk Corinn Skiff, MD;  Location: [facility] ENDOSCOPY;  Service: Gastroenterology;  Laterality: N/A;   IR BONE MARROW BIOPSY & ASPIRATION  05/18/2023   none      Social History   Socioeconomic History  Marital status: Divorced    Spouse name: Not on file   Number of children: 0   Years of education: Not on file   Highest education level: Not on file  Occupational History   Occupation: on disability  Tobacco Use   Smoking status: Never   Smokeless tobacco:  Never  Vaping Use   Vaping status: Never Used  Substance and Sexual Activity   Alcohol  use: Yes    Alcohol /week: 21.0 standard drinks of alcohol     Types: 21 Shots of liquor per week    Comment: 3 shots of liquor daily   Drug use: No   Sexual activity: Yes  Other Topics Concern   Not on file  Social History Narrative   Not on file   Social Drivers of Health   Financial Resource Strain: Low Risk  (05/14/2023)   Overall Financial Resource Strain (CARDIA)    Difficulty of Paying Living Expenses: Not very hard  Food Insecurity: No Food Insecurity (11/05/2023)   Hunger Vital Sign    Worried About Running Out of Food in the Last Year: Never true    Ran Out of Food in the Last Year: Never true  Transportation Needs: No Transportation Needs (11/05/2023)   PRAPARE - Administrator, Civil Service (Medical): No    Lack of Transportation (Non-Medical): No  Physical Activity: Inactive (05/14/2023)   Exercise Vital Sign    Days of Exercise per Week: 0 days    Minutes of Exercise per Session: 0 min  Stress: No Stress Concern Present (05/14/2023)   [employer] of Occupational Health - Occupational Stress Questionnaire    Feeling of Stress : Not at all  Social Connections: Socially Isolated (11/05/2023)   Social Connection and Isolation Panel    Frequency of Communication with Friends and Family: More than three times a week    Frequency of Social Gatherings with Friends and Family: More than three times a week    Attends Religious Services: Never    Database administrator or Organizations: No    Attends Banker Meetings: Never    Marital Status: Divorced  Catering manager Violence: Not At Risk (11/05/2023)   Humiliation, Afraid, Rape, and Kick questionnaire    Fear of Current or Ex-Partner: No    Emotionally Abused: No    Physically Abused: No    Sexually Abused: No    Family History  Problem Relation Age of Onset   Rheum arthritis Mother    Diabetes  Father    Heart disease Father    Cancer Father    COPD Sister    Rheum arthritis Sister    Diabetes Maternal Grandmother    Cancer Maternal Grandfather    Diabetes Paternal Grandmother    Cancer Paternal Grandfather      Current Outpatient Medications:    allopurinol  (ZYLOPRIM ) 300 MG tablet, Take 1 tablet (300 mg total) by mouth daily., Disp: 90 tablet, Rfl: 4   atorvastatin  (LIPITOR) 40 MG tablet, Take 1 tablet (40 mg total) by mouth daily., Disp: 90 tablet, Rfl: 4   Blood Glucose Monitoring Suppl (ONETOUCH VERIO) w/Device KIT, Use to check blood sugar 3 times a day and document results, bring to appointments.  Goal is <130 fasting blood sugar and <180 two hours after meals., Disp: 1 kit, Rfl: 0   Cholecalciferol 50 MCG (2000 UT) CAPS, Take 1 capsule by mouth daily., Disp: , Rfl:    cyanocobalamin  (VITAMIN B12) 1000 MCG tablet, Take 1  tablet (1,000 mcg total) by mouth daily., Disp: 90 tablet, Rfl: 4   glucose blood (ONETOUCH VERIO) test strip, Use to check blood sugar 3 times a day and document results, bring to appointments.  Goal is <130 fasting blood sugar and <180 two hours after meals., Disp: 100 each, Rfl: 12   Lancets (ONETOUCH ULTRASOFT) lancets, Use to check blood sugar 3 times a day and document results, bring to appointments.  Goal is <130 fasting blood sugar and <180 two hours after meals., Disp: 100 each, Rfl: 12   loperamide  (IMODIUM ) 2 MG capsule, Take by mouth., Disp: , Rfl:    ondansetron  (ZOFRAN ) 4 MG tablet, Take 1 tablet (4 mg total) by mouth every 8 (eight) hours as needed., Disp: 15 tablet, Rfl: 0   pantoprazole  (PROTONIX ) 40 MG tablet, Take 1 tablet (40 mg total) by mouth daily., Disp: 90 tablet, Rfl: 4   propranolol  ER (INDERAL  LA) 60 MG 24 hr capsule, Take 1 capsule (60 mg total) by mouth daily., Disp: 90 capsule, Rfl: 4   tiZANidine  (ZANAFLEX ) 2 MG tablet, TAKE 1 TABLET(2 MG) BY MOUTH EVERY 8 HOURS AS NEEDED FOR MUSCLE SPASMS, Disp: 30 tablet, Rfl: 0   colchicine   0.6 MG tablet, Take 1 tablet (0.6 mg total) by mouth 2 (two) times daily., Disp: 20 tablet, Rfl: 0  Physical exam:  Vitals:   11/10/23 1508  BP: (!) 149/81  Pulse: 71  Resp: 19  Temp: 98.9 F (37.2 C)  TempSrc: Tympanic  SpO2: 99%  Weight: 207 lb 8 oz (94.1 kg)  Height: 5' 10 (1.778 m)   Physical Exam Cardiovascular:     Rate and Rhythm: Normal rate and regular rhythm.     Heart sounds: Normal heart sounds.  Pulmonary:     Effort: Pulmonary effort is normal.     Breath sounds: Normal breath sounds.  Skin:    General: Skin is warm and dry.  Neurological:     Mental Status: He is alert and oriented to person, place, and time.      I have personally reviewed labs listed below:    Latest Ref Rng & Units 11/05/2023    5:52 AM  CMP  Glucose 70 - 99 mg/dL 886   BUN 8 - 23 mg/dL 8   Creatinine 9.38 - 8.75 mg/dL 9.18   Sodium 864 - 854 mmol/L 138   Potassium 3.5 - 5.1 mmol/L 3.0   Chloride 98 - 111 mmol/L 101   CO2 22 - 32 mmol/L 23   Calcium  8.9 - 10.3 mg/dL 8.2   Total Protein 6.5 - 8.1 g/dL 6.3   Total Bilirubin 0.0 - 1.2 mg/dL 1.0   Alkaline Phos 38 - 126 U/L 59   AST 15 - 41 U/L 44   ALT 0 - 44 U/L 35       Latest Ref Rng & Units 11/10/2023    2:44 PM  CBC  WBC 4.0 - 10.5 K/uL 4.7   Hemoglobin 13.0 - 17.0 g/dL 9.9   Hematocrit 60.9 - 52.0 % 30.4   Platelets 150 - 400 K/uL 207    I have personally reviewed Radiology images listed below: No images are attached to the encounter.  CT ABDOMEN PELVIS W CONTRAST Result Date: 11/04/2023 CLINICAL DATA:  Left lower quadrant abdominal pain.  Diarrhea. EXAM: CT ABDOMEN AND PELVIS WITH CONTRAST TECHNIQUE: Multidetector CT imaging of the abdomen and pelvis was performed using the standard protocol following bolus administration of intravenous contrast. RADIATION DOSE REDUCTION:  This exam was performed according to the departmental dose-optimization program which includes automated exposure control, adjustment of the mA  and/or kV according to patient size and/or use of iterative reconstruction technique. CONTRAST:  OMNIPAQUE  IOHEXOL  300 MG/ML  SOLN COMPARISON:  12/09/2022 FINDINGS: Lower chest: Chronic volume loss at the right lung base particularly in the right middle lobe. Chronic scarring along the medial lung bases bilaterally. No large pleural effusions. Coronary artery calcifications. Hepatobiliary: Diffuse low-density in the liver is compatible with hepatic steatosis. No acute abnormality to the gallbladder. Main portal venous system is patent. No biliary dilatation. Pancreas: Unremarkable. No pancreatic ductal dilatation or surrounding inflammatory changes. Spleen: Normal in size without focal abnormality. Adrenals/Urinary Tract: Normal adrenal glands. Stable appearance of both kidneys with mild perinephric edema or stranding. No suspicious renal lesions. No hydronephrosis. No acute abnormality to the urinary bladder. Stomach/Bowel: Normal appearance of the stomach. No bowel dilatation or obstruction. Scattered colonic diverticula without acute bowel inflammation. Normal appendix. Vascular/Lymphatic: Atherosclerotic calcifications involving the abdominal aorta without aneurysm. Main visceral arteries are patent. No significant lymph node enlargement in the abdomen or pelvis. Reproductive: Prostate is unremarkable. Other: Small right inguinal hernia containing fat. Negative for free fluid. Negative for free air. Musculoskeletal: No acute bone abnormality. Disc space narrowing at L4-L5 and L5-S1. IMPRESSION: 1. No acute abnormality in the abdomen or pelvis. 2. Hepatic steatosis. 3. Chronic volume loss at the right lung base. 4. Aortic Atherosclerosis (ICD10-I70.0). 5. Multiple colonic diverticula.  No acute bowel inflammation. Electronically Signed   By: Juliene Balder M.D.   On: 11/04/2023 12:23     Assessment and plan- Patient is a 63 y.o. male here for routine f/u of anemia      Anemia likely related to chronic  alcohol  use Anemia improved with hemoglobin at 9.9, likely due to reduced alcohol  consumption. - Repeat blood work in 3 months to monitor hemoglobin and vitamin levels. - Monitor B12 and B1 levels and supplement if low.  Alcohol  use disorder Alcohol  consumption reduced to a pint a day. Further reduction encouraged to improve anemia and health. - Encourage further reduction in alcohol  consumption.         Visit Diagnosis 1. Sideroblastic anemia (HCC)      Dr. Annah Skene, MD, MPH [facility] [phone] 11/12/2023 6:53 PM

## 2023-11-13 ENCOUNTER — Inpatient Hospital Stay: Admitting: Nurse Practitioner

## 2023-11-13 DIAGNOSIS — A09 Infectious gastroenteritis and colitis, unspecified: Secondary | ICD-10-CM

## 2023-11-13 DIAGNOSIS — E876 Hypokalemia: Secondary | ICD-10-CM

## 2023-11-13 DIAGNOSIS — Z23 Encounter for immunization: Secondary | ICD-10-CM

## 2023-11-13 DIAGNOSIS — N183 Chronic kidney disease, stage 3 unspecified: Secondary | ICD-10-CM

## 2023-11-13 DIAGNOSIS — D643 Other sideroblastic anemias: Secondary | ICD-10-CM

## 2023-11-16 LAB — VITAMIN B1: Vitamin B1 (Thiamine): 59.8 nmol/L — ABNORMAL LOW (ref 66.5–200.0)

## 2023-11-27 ENCOUNTER — Encounter: Payer: Self-pay | Admitting: Hematology and Oncology

## 2023-12-14 NOTE — Progress Notes (Signed)
 Symptoms concerning for possible diarrhea illness - possible c diff or food born illness

## 2023-12-15 ENCOUNTER — Ambulatory Visit: Payer: Self-pay

## 2023-12-15 NOTE — Telephone Encounter (Signed)
 FYI Only or Action Required?: Action required by provider: request for appointment.  Patient was last seen in primary care on 10/19/2023 by Zafirov, Clarissa A, MD.  Called Nurse Triage reporting Knee Pain.  Symptoms began a week ago.  Interventions attempted: Rest, hydration, or home remedies.  Symptoms are: gradually worsening.Right knee pain and swelling.  Triage Disposition: See Physician Within 24 Hours  Patient/caregiver understands and will follow disposition?: Yes      Copied from CRM #[id number]. Topic: Clinical - Red Word Triage >> Dec 15, 2023 10:34 AM Tinnie BROCKS wrote: Red Word that prompted transfer to Nurse Triage: swelling and pain in knee Reason for Disposition  [1] Very swollen joint AND [2] no fever  Answer Assessment - Initial Assessment Questions 1. LOCATION and RADIATION: Where is the pain located?      Right 2. QUALITY: What does the pain feel like?  (e.g., sharp, dull, aching, burning)     Sharp 3. SEVERITY: How bad is the pain? What does it keep you from doing?   (Scale 1-10; or mild, moderate, severe)     7 4. ONSET: When did the pain start? Does it come and go, or is it there all the time?     For awhile 5. RECURRENT: Have you had this pain before? If Yes, ask: When, and what happened then?     yes 6. SETTING: Has there been any recent work, exercise or other activity that involved that part of the body?      no 7. AGGRAVATING FACTORS: What makes the knee pain worse? (e.g., walking, climbing stairs, running)     walking 8. ASSOCIATED SYMPTOMS: Is there any swelling or redness of the knee?     swelling 9. OTHER SYMPTOMS: Do you have any other symptoms? (e.g., calf pain, chest pain, difficulty breathing, fever)     no 10. PREGNANCY: Is there any chance you are pregnant? When was your last menstrual period?       N/a  Protocols used: Knee Pain-A-AH

## 2023-12-16 ENCOUNTER — Ambulatory Visit
Admission: RE | Admit: 2023-12-16 | Discharge: 2023-12-16 | Disposition: A | Discharge status: Home / Self Care | Attending: Family Medicine | Admitting: Family Medicine

## 2023-12-16 ENCOUNTER — Ambulatory Visit
Admission: RE | Admit: 2023-12-16 | Discharge: 2023-12-16 | Disposition: A | Discharge status: Home / Self Care | Source: Ambulatory Visit | Attending: Family Medicine | Admitting: Family Medicine

## 2023-12-16 ENCOUNTER — Encounter: Payer: Self-pay | Admitting: Family Medicine

## 2023-12-16 ENCOUNTER — Other Ambulatory Visit (INDEPENDENT_AMBULATORY_CARE_PROVIDER_SITE_OTHER): Payer: Self-pay | Admitting: Radiology

## 2023-12-16 ENCOUNTER — Ambulatory Visit (INDEPENDENT_AMBULATORY_CARE_PROVIDER_SITE_OTHER): Admitting: Family Medicine

## 2023-12-16 VITALS — BP 130/66 | HR 83 | Ht 70.0 in | Wt 210.0 lb

## 2023-12-16 DIAGNOSIS — M25461 Effusion, right knee: Secondary | ICD-10-CM

## 2023-12-16 MED ORDER — MELOXICAM 15 MG PO TABS: 15.0000 mg | ORAL_TABLET | Freq: Every day | ORAL | 0 refills | Status: DC

## 2023-12-25 NOTE — Progress Notes (Unsigned)
 Primary Care / Sports Medicine Office Visit  Patient Information:  Patient ID: George Gomez, male DOB: [birth year] Age: 63 y.o. MRN: [id number]   George Gomez is a pleasant 63 y.o. male presenting with the following:  Chief Complaint  Patient presents with   Knee Pain    Right knee pain, reports falling 2 months ago, swollen, has been seen before for this problem    Vitals:   12/16/23 1106  BP: 130/66  Pulse: 83  SpO2: 95%   Vitals:   12/16/23 1106  Weight: 210 lb (95.3 kg)  Height: 5' 10 (1.778 m)   Body mass index is 30.13 kg/m.  DG Knee Complete 4 Views Right Result Date: 12/20/2023 CLINICAL DATA:  Effusion of right knee.  Chronic pain. EXAM: RIGHT KNEE - COMPLETE 4+ VIEW COMPARISON:  None Available. FINDINGS: Medial tibiofemoral joint space narrowing. Mild tricompartmental peripheral spurring. No fracture, erosion, or focal bone abnormality. Small joint effusion. Patellar tendon enthesophyte at the patellar insertion. Unremarkable soft tissues. IMPRESSION: Mild tricompartmental osteoarthritis, most prominent in the medial tibiofemoral compartment. Small joint effusion. Electronically Signed   By: Andrea Gasman M.D.   On: 12/20/2023 15:37     Discussed the use of AI scribe software for clinical note transcription with the patient, who gave verbal consent to proceed.   Independent interpretation of notes and tests performed by another provider:   None  Procedures performed:   Procedure:  Injection following aspiration of right knee under ultrasound guidance. Ultrasound guidance utilized for anteromedial approach, joint space visualized Samsung HS60 device utilized with permanent recording / reporting. Verbal informed consent obtained and verified. Skin prepped in a sterile fashion. Ethyl chloride for topical local analgesia.  Completed without difficulty and tolerated well. Aspirate: 15 mL serosanguinous synovial fluid. Medication: triamcinolone  acetonide  40 mg/mL suspension for injection 1 mL total and 2 mL lidocaine  1% without epinephrine utilized for needle placement anesthetic Advised to contact for fevers/chills, erythema, induration, drainage, or persistent bleeding.   Pertinent History, Exam, Impression, and Recommendations:   History of Present Illness George Gomez is a 63 year old male with chronic knee pain who presents with worsening right knee pain and swelling.  Right knee pain and swelling - Chronic right knee pain with worsening symptoms - Initial improvement after fluid aspiration and injection in August, but pain never fully resolved - Increased pain and swelling following a fall approximately 1.5 months ago - Pain is persistent with occasional sharp episodes, especially when sitting or with certain movements - Pain localized to the anterior and medial aspects of the knee - Knee swelling present - Use of cane for support due to pain and instability - Impaired daily activities due to pain and instability  Recent fall - Fall occurred approximately 1.5 months ago, attributed to ongoing knee pain - No memory loss associated with the fall - No head injury during the fall  Previous treatments for knee pain - Underwent fluid aspiration and injection in August with only partial and temporary improvement - Previously participated in physical therapy without significant benefit  Anemia - History of anemia identified during a recent visit to [facility] for diarrhea - Anemia has been a concern for several years - No clear source of blood loss identified  Alcohol  use - Alcohol  use acknowledged  Current medications - Currently taking levothyroxine  Physical Exam VITALS: P- 83, BP- 130/66 INSPECTION: Right knee swollen. PALPATION: Lateral joint line non-tender. Tenderness at inferior aspect of patella. RANGE OF MOTION:  ROM 0-125 degrees with painful terminal flexion anterolaterally.  Assessment and Plan Right knee pain with  effusion Chronic knee pain with effusion, exacerbated by recent fall. Swelling and tenderness noted, limited ROM. Possible cartilage damage behind patella. Anemia managed by primary care. - Performed knee aspiration. - Administered cortisone injection. - Provided knee brace with hinged patella support. - Prescribed AAOS home exercises. - Ordered knee x-ray. - Will contact insurance for gel injection approval. - Prescribed prescription-grade anti-inflammatory, to be taken with food.  Problem List Items Addressed This Visit     Effusion of right knee - Primary   Relevant Orders   US  LIMITED JOINT SPACE STRUCTURES LOW RIGHT   DG Knee Complete 4 Views Right (Completed)     Orders & Medications Medications:  Meds ordered this encounter  Medications   meloxicam  (MOBIC ) 15 MG tablet    Sig: Take 1 tablet (15 mg total) by mouth daily. Take daily until knee symptoms resolve.    Dispense:  30 tablet    Refill:  0   Orders Placed This Encounter  Procedures   US  LIMITED JOINT SPACE STRUCTURES LOW RIGHT   DG Knee Complete 4 Views Right     No follow-ups on file.     Selinda JINNY Ku, MD, [facility]   Primary Care Sports Medicine Primary Care and Sports Medicine at [facility]

## 2023-12-26 ENCOUNTER — Telehealth: Payer: Self-pay | Admitting: Family Medicine

## 2023-12-26 NOTE — Patient Instructions (Addendum)
 VISIT SUMMARY:  Today, we addressed your chronic right knee pain and swelling, which has worsened since your recent fall. We performed a knee aspiration, administered a cortisone injection, and provided a knee brace. We also discussed your anemia and current medications.  YOUR PLAN:  RIGHT KNEE PAIN WITH EFFUSION: You have chronic knee pain with swelling, which has worsened after a recent fall.  -We performed a knee aspiration to remove excess fluid. -We administered a cortisone injection to reduce inflammation. -You were provided with a knee brace with hinged patella support to help stabilize your knee. -We prescribed AAOS home exercises for you to perform. -A knee x-ray was ordered to further evaluate your condition. -We will contact your insurance to get approval for a gel injection. -You were prescribed a prescription-grade anti-inflammatory medication, which should be taken with food.

## 2023-12-28 ENCOUNTER — Ambulatory Visit: Payer: Self-pay | Admitting: Family Medicine

## 2023-12-28 NOTE — Telephone Encounter (Signed)
 Submitted

## 2024-01-14 ENCOUNTER — Other Ambulatory Visit: Payer: Self-pay | Admitting: Nurse Practitioner

## 2024-01-14 DIAGNOSIS — E1140 Type 2 diabetes mellitus with diabetic neuropathy, unspecified: Secondary | ICD-10-CM

## 2024-01-14 DIAGNOSIS — M1A0610 Idiopathic chronic gout, right knee, without tophus (tophi): Secondary | ICD-10-CM

## 2024-01-14 DIAGNOSIS — E1129 Type 2 diabetes mellitus with other diabetic kidney complication: Secondary | ICD-10-CM

## 2024-01-18 ENCOUNTER — Telehealth: Payer: Self-pay

## 2024-01-18 NOTE — Telephone Encounter (Signed)
 Copied from CRM #[id number]. Topic: Clinical - Lab/Test Results >> Jan 18, 2024 10:31 AM Franky GRADE wrote: Reason for CRM: Patient is calling to discuss the x-rays done on 12/16/2023.

## 2024-01-18 NOTE — Telephone Encounter (Signed)
 Spoke with patient and informed him of xray results. I also asked him if he wanted to move forward with getting the monovisc HA injection for his knee. He said yes. I will inform Amy to go ahead and order.

## 2024-01-18 NOTE — Telephone Encounter (Signed)
 Discontinued 07/25/22.  Requested Prescriptions  Pending Prescriptions Disp Refills   metFORMIN  (GLUCOPHAGE -XR) 500 MG 24 hr tablet [Pharmacy Med Name: METFORMIN  ER 500MG  24HR TABS] 90 tablet 4    Sig: TAKE 1 TABLET(500 MG) BY MOUTH DAILY     Endocrinology:  Diabetes - Biguanides Failed - 01/18/2024  2:53 PM      Failed - HBA1C is between 0 and 7.9 and within 180 days    HB A1C (BAYER DCA - WAIVED)  Date Value Ref Range Status  06/26/2023 5.5 4.8 - 5.6 % Final    Comment:             Prediabetes: 5.7 - 6.4          Diabetes: >6.4          Glycemic control for adults with diabetes: <7.0          Passed - Cr in normal range and within 360 days    Creatinine  Date Value Ref Range Status  09/10/2023 1.05 0.61 - 1.24 mg/dL Final  91/75/7984 8.74 0.60 - 1.30 mg/dL Final   Creatinine, Ser  Date Value Ref Range Status  11/05/2023 0.81 0.61 - 1.24 mg/dL Final         Passed - eGFR in normal range and within 360 days    EGFR (African American)  Date Value Ref Range Status  09/19/2013 >60  Final   GFR calc Af Amer  Date Value Ref Range Status  09/27/2019 >60 >60 mL/min Final   EGFR (Non-African Amer.)  Date Value Ref Range Status  09/19/2013 >60  Final    Comment:    eGFR values <41mL/min/1.73 m2 may be an indication of chronic kidney disease (CKD). Calculated eGFR is useful in patients with stable renal function. The eGFR calculation will not be reliable in acutely ill patients when serum creatinine is changing rapidly. It is not useful in  patients on dialysis. The eGFR calculation may not be applicable to patients at the low and high extremes of body sizes, pregnant women, and vegetarians.    GFR, Estimated  Date Value Ref Range Status  11/05/2023 >60 >60 mL/min Final    Comment:    (NOTE) Calculated using the CKD-EPI Creatinine Equation (2021)   09/10/2023 >60 >60 mL/min Final    Comment:    (NOTE) Calculated using the CKD-EPI Creatinine Equation (2021)     eGFR  Date Value Ref Range Status  06/26/2023 87 >59 mL/min/1.73 Final         Passed - B12 Level in normal range and within 720 days    Vitamin B-12  Date Value Ref Range Status  11/10/2023 567 180 - 914 pg/mL Final    Comment:    (NOTE) This assay is not validated for testing neonatal or myeloproliferative syndrome specimens for Vitamin B12 levels. Performed at [facility], [address], [place], [place] [address]          Passed - Valid encounter within last 6 months    Recent Outpatient Visits           1 month ago Effusion of right knee   [place] [facility] at [facility], Selinda PARAS, MD   3 months ago Epiphora due to excess lacrimation of both sides   [place] [facility] [place], [place], MD   4 months ago Effusion of right knee   [facility] at [facility]  [place] Selinda PARAS, MD   4 months ago Effusion of right knee   [place] [facility] at [facility], Selinda PARAS, MD   5 months ago Acute gout of right knee, unspecified cause   [place] [facility] Herold Hadassah SQUIBB, MD              Passed - CBC within normal limits and completed in the last 12 months    WBC  Date Value Ref Range Status  11/05/2023 3.2 (L) 4.0 - 10.5 K/uL Final   WBC Count  Date Value Ref Range Status  11/10/2023 4.7 4.0 - 10.5 K/uL Final   RBC  Date Value Ref Range Status  11/10/2023 3.18 (L) 4.22 - 5.81 MIL/uL Final   Hemoglobin  Date Value Ref Range Status  11/10/2023 9.9 (L) 13.0 - 17.0 g/dL Final  97/75/7974 9.0 (L) 13.0 - 17.7 g/dL Final   HCT  Date Value Ref Range Status  11/10/2023 30.4 (L) 39.0 - 52.0 % Final   Hematocrit  Date Value Ref Range Status  03/23/2023 28.1 (L) 37.5 - 51.0 % Final   MCHC  Date Value Ref Range Status  11/10/2023 32.6 30.0 - 36.0 g/dL Final   [facility]  Date Value Ref Range Status  11/10/2023  31.1 26.0 - 34.0 pg Final   MCV  Date Value Ref Range Status  11/10/2023 95.6 80.0 - 100.0 fL Final  03/23/2023 98 (H) 79 - 97 fL Final  09/19/2013 91 80 - 100 fL Final   No results found for: PLTCOUNTKUC, LABPLAT, POCPLA RDW  Date Value Ref Range Status  11/10/2023 16.7 (H) 11.5 - 15.5 % Final  03/23/2023 17.5 (H) 11.6 - 15.4 % Final  09/19/2013 15.5 (H) 11.5 - 14.5 % Final          potassium chloride  (KLOR-CON  M) 10 MEQ tablet [Pharmacy Med Name: POTASSIUM CL MICRO ER TABS] 10 tablet 0    Sig: TAKE 2 TABLETS(20 MEQ) BY MOUTH DAILY FOR 5 DAYS     Endocrinology:  Minerals - Potassium Supplementation Failed - 01/18/2024  2:53 PM      Failed - K in normal range and within 360 days    Potassium  Date Value Ref Range Status  11/05/2023 3.0 (L) 3.5 - 5.1 mmol/L Final  09/19/2013 3.4 (L) 3.5 - 5.1 mmol/L Final         Passed - Cr in normal range and within 360 days    Creatinine  Date Value Ref Range Status  09/10/2023 1.05 0.61 - 1.24 mg/dL Final  91/75/7984 8.74 0.60 - 1.30 mg/dL Final   Creatinine, Ser  Date Value Ref Range Status  11/05/2023 0.81 0.61 - 1.24 mg/dL Final         Passed - Valid encounter within last 12 months    Recent Outpatient Visits           1 month ago Effusion of right knee   [place] [facility] at [facility] [place] Selinda PARAS, MD   3 months ago Epiphora due to excess lacrimation of both sides   [place] [facility] Everlene Parris LABOR, MD   4 months ago Effusion of right knee   [facility], Selinda PARAS, MD   4 months ago Effusion of right knee   [facility] at [facility] [place], Selinda  J, MD   5 months ago Acute gout of right knee, unspecified cause   [place] [facility] Herold Hadassah SQUIBB, MD               allopurinol  (ZYLOPRIM ) 300 MG tablet [Pharmacy Med Name:  ALLOPURINOL  300MG  TABLETS] 90 tablet 0    Sig: TAKE 1 TABLET(300 MG) BY MOUTH DAILY     Endocrinology:  Gout Agents - allopurinol  Failed - 01/18/2024  2:53 PM      Failed - Uric Acid in normal range and within 360 days    Uric Acid  Date Value Ref Range Status  07/19/2020 6.0 3.8 - 8.4 mg/dL Final    Comment:               Therapeutic target for gout patients: <6.0         Passed - Cr in normal range and within 360 days    Creatinine  Date Value Ref Range Status  09/10/2023 1.05 0.61 - 1.24 mg/dL Final  91/75/7984 8.74 0.60 - 1.30 mg/dL Final   Creatinine, Ser  Date Value Ref Range Status  11/05/2023 0.81 0.61 - 1.24 mg/dL Final         Passed - Valid encounter within last 12 months    Recent Outpatient Visits           1 month ago Effusion of right knee   [place] [facility] at [facility], Selinda PARAS, MD   3 months ago Epiphora due to excess lacrimation of both sides   [place] [facility] [place], Parris LABOR, MD   4 months ago Effusion of right knee   [place] [facility] at [facility], Selinda PARAS, MD   4 months ago Effusion of right knee   [place] [facility] at [facility], Selinda PARAS, MD   5 months ago Acute gout of right knee, unspecified cause   [place] Herold Hadassah SQUIBB, MD              Passed - CBC within normal limits and completed in the last 12 months    WBC  Date Value Ref Range Status  11/05/2023 3.2 (L) 4.0 - 10.5 K/uL Final   WBC Count  Date Value Ref Range Status  11/10/2023 4.7 4.0 - 10.5 K/uL Final   RBC  Date Value Ref Range Status  11/10/2023 3.18 (L) 4.22 - 5.81 MIL/uL Final   Hemoglobin  Date Value Ref Range Status  11/10/2023 9.9 (L) 13.0 - 17.0 g/dL Final  97/75/7974 9.0 (L) 13.0 - 17.7 g/dL Final   HCT  Date Value Ref Range Status  11/10/2023 30.4 (L) 39.0 - 52.0 % Final    Hematocrit  Date Value Ref Range Status  03/23/2023 28.1 (L) 37.5 - 51.0 % Final   MCHC  Date Value Ref Range Status  11/10/2023 32.6 30.0 - 36.0 g/dL Final   [facility]  Date Value Ref Range Status  11/10/2023 31.1 26.0 - 34.0 pg Final   MCV  Date Value Ref Range Status  11/10/2023 95.6 80.0 - 100.0 fL Final  03/23/2023 98 (H) 79 - 97 fL Final  09/19/2013 91 80 - 100 fL Final   No results found for: PLTCOUNTKUC, LABPLAT, POCPLA RDW  Date Value Ref Range Status  11/10/2023 16.7 (H) 11.5 - 15.5 % Final  03/23/2023 17.5 (H) 11.6 - 15.4 %  Final  09/19/2013 15.5 (H) 11.5 - 14.5 % Final

## 2024-01-20 ENCOUNTER — Other Ambulatory Visit: Payer: Self-pay | Admitting: Family Medicine

## 2024-01-20 DIAGNOSIS — M25461 Effusion, right knee: Secondary | ICD-10-CM

## 2024-01-22 NOTE — Telephone Encounter (Signed)
 Requested medication (s) are due for refill today -unsure  Requested medication (s) are on the active medication list -yes  Future visit scheduled -no  Last refill: 12/16/23 #30  Notes to clinic: specialist patient/Rx- sent for review of request  Requested Prescriptions  Pending Prescriptions Disp Refills   meloxicam  (MOBIC ) 15 MG tablet [Pharmacy Med Name: MELOXICAM  15MG  TABLETS] 30 tablet 0    Sig: TAKE 1 TABLET BY MOUTH DAILY, UNTIL KNEE SYMPTOMS RESOLVE     Analgesics:  COX2 Inhibitors Failed - 01/22/2024  1:17 PM      Failed - Manual Review: Labs are only required if the patient has taken medication for more than 8 weeks.      Failed - HGB in normal range and within 360 days    Hemoglobin  Date Value Ref Range Status  11/10/2023 9.9 (L) 13.0 - 17.0 g/dL Final  97/75/7974 9.0 (L) 13.0 - 17.7 g/dL Final         Failed - HCT in normal range and within 360 days    HCT  Date Value Ref Range Status  11/10/2023 30.4 (L) 39.0 - 52.0 % Final   Hematocrit  Date Value Ref Range Status  03/23/2023 28.1 (L) 37.5 - 51.0 % Final         Failed - AST in normal range and within 360 days    AST  Date Value Ref Range Status  11/05/2023 44 (H) 15 - 41 U/L Final  09/10/2023 44 (H) 15 - 41 U/L Final         Passed - Cr in normal range and within 360 days    Creatinine  Date Value Ref Range Status  09/10/2023 1.05 0.61 - 1.24 mg/dL Final  91/75/7984 8.74 0.60 - 1.30 mg/dL Final   Creatinine, Ser  Date Value Ref Range Status  11/05/2023 0.81 0.61 - 1.24 mg/dL Final         Passed - ALT in normal range and within 360 days    ALT  Date Value Ref Range Status  11/05/2023 35 0 - 44 U/L Final  09/10/2023 29 0 - 44 U/L Final   SGPT (ALT)  Date Value Ref Range Status  09/19/2013 32 U/L Final    Comment:    14-63 NOTE: New Reference Range 08/16/13          Passed - eGFR is 30 or above and within 360 days    EGFR (African American)  Date Value Ref Range Status  09/19/2013  >60  Final   GFR calc Af Amer  Date Value Ref Range Status  09/27/2019 >60 >60 mL/min Final   EGFR (Non-African Amer.)  Date Value Ref Range Status  09/19/2013 >60  Final    Comment:    eGFR values <26mL/min/1.73 m2 may be an indication of chronic kidney disease (CKD). Calculated eGFR is useful in patients with stable renal function. The eGFR calculation will not be reliable in acutely ill patients when serum creatinine is changing rapidly. It is not useful in  patients on dialysis. The eGFR calculation may not be applicable to patients at the low and high extremes of body sizes, pregnant women, and vegetarians.    GFR, Estimated  Date Value Ref Range Status  11/05/2023 >60 >60 mL/min Final    Comment:    (NOTE) Calculated using the CKD-EPI Creatinine Equation (2021)   09/10/2023 >60 >60 mL/min Final    Comment:    (NOTE) Calculated using the CKD-EPI Creatinine Equation (2021)  eGFR  Date Value Ref Range Status  06/26/2023 87 >59 mL/min/1.73 Final         Passed - Patient is not pregnant      Passed - Valid encounter within last 12 months    Recent Outpatient Visits           1 month ago Effusion of right knee   [place] [facility], Selinda PARAS, MD   3 months ago Epiphora due to excess lacrimation of both sides   [place] [facility] , [place], MD   4 months ago Effusion of right knee   [facility] at [facility], Selinda PARAS, MD   4 months ago Effusion of right knee   [facility] at [facility], Selinda PARAS, MD   5 months ago Acute gout of right knee, unspecified cause   [place] [facility] Herold Hadassah SQUIBB, MD                 Requested Prescriptions  Pending Prescriptions Disp Refills   meloxicam  (MOBIC ) 15 MG tablet [Pharmacy Med Name: MELOXICAM  15MG  TABLETS] 30 tablet 0     Sig: TAKE 1 TABLET BY MOUTH DAILY, UNTIL KNEE SYMPTOMS RESOLVE     Analgesics:  COX2 Inhibitors Failed - 01/22/2024  1:17 PM      Failed - Manual Review: Labs are only required if the patient has taken medication for more than 8 weeks.      Failed - HGB in normal range and within 360 days    Hemoglobin  Date Value Ref Range Status  11/10/2023 9.9 (L) 13.0 - 17.0 g/dL Final  97/75/7974 9.0 (L) 13.0 - 17.7 g/dL Final         Failed - HCT in normal range and within 360 days    HCT  Date Value Ref Range Status  11/10/2023 30.4 (L) 39.0 - 52.0 % Final   Hematocrit  Date Value Ref Range Status  03/23/2023 28.1 (L) 37.5 - 51.0 % Final         Failed - AST in normal range and within 360 days    AST  Date Value Ref Range Status  11/05/2023 44 (H) 15 - 41 U/L Final  09/10/2023 44 (H) 15 - 41 U/L Final         Passed - Cr in normal range and within 360 days    Creatinine  Date Value Ref Range Status  09/10/2023 1.05 0.61 - 1.24 mg/dL Final  91/75/7984 8.74 0.60 - 1.30 mg/dL Final   Creatinine, Ser  Date Value Ref Range Status  11/05/2023 0.81 0.61 - 1.24 mg/dL Final         Passed - ALT in normal range and within 360 days    ALT  Date Value Ref Range Status  11/05/2023 35 0 - 44 U/L Final  09/10/2023 29 0 - 44 U/L Final   SGPT (ALT)  Date Value Ref Range Status  09/19/2013 32 U/L Final    Comment:    14-63 NOTE: New Reference Range 08/16/13          Passed - eGFR is 30 or above and within 360 days    EGFR (African American)  Date Value Ref Range Status  09/19/2013 >60  Final   GFR calc Af Amer  Date Value Ref Range Status  09/27/2019 >60 >60  mL/min Final   EGFR (Non-African Amer.)  Date Value Ref Range Status  09/19/2013 >60  Final    Comment:    eGFR values <20mL/min/1.73 m2 may be an indication of chronic kidney disease (CKD). Calculated eGFR is useful in patients with stable renal function. The eGFR calculation will not be reliable in acutely ill  patients when serum creatinine is changing rapidly. It is not useful in  patients on dialysis. The eGFR calculation may not be applicable to patients at the low and high extremes of body sizes, pregnant women, and vegetarians.    GFR, Estimated  Date Value Ref Range Status  11/05/2023 >60 >60 mL/min Final    Comment:    (NOTE) Calculated using the CKD-EPI Creatinine Equation (2021)   09/10/2023 >60 >60 mL/min Final    Comment:    (NOTE) Calculated using the CKD-EPI Creatinine Equation (2021)    eGFR  Date Value Ref Range Status  06/26/2023 87 >59 mL/min/1.73 Final         Passed - Patient is not pregnant      Passed - Valid encounter within last 12 months    Recent Outpatient Visits           1 month ago Effusion of right knee   [place] [facility], Selinda PARAS, MD   3 months ago Epiphora due to excess lacrimation of both sides   [place] [facility] [place], [place], MD   4 months ago Effusion of right knee   [facility] at [facility], Selinda PARAS, MD   4 months ago Effusion of right knee   [facility] at [facility], Selinda PARAS, MD   5 months ago Acute gout of right knee, unspecified cause    [facility] Herold Hadassah SQUIBB, MD

## 2024-02-10 ENCOUNTER — Inpatient Hospital Stay: Attending: Oncology

## 2024-02-14 NOTE — Patient Instructions (Signed)

## 2024-02-18 ENCOUNTER — Encounter: Payer: Self-pay | Admitting: Nurse Practitioner

## 2024-02-18 ENCOUNTER — Ambulatory Visit: Admitting: Nurse Practitioner

## 2024-02-18 VITALS — BP 131/76 | HR 96 | Temp 98.8°F | Ht 69.0 in | Wt 205.4 lb

## 2024-02-18 DIAGNOSIS — D643 Other sideroblastic anemias: Secondary | ICD-10-CM

## 2024-02-18 DIAGNOSIS — Z23 Encounter for immunization: Secondary | ICD-10-CM

## 2024-02-18 DIAGNOSIS — E1159 Type 2 diabetes mellitus with other circulatory complications: Secondary | ICD-10-CM

## 2024-02-18 DIAGNOSIS — F1010 Alcohol abuse, uncomplicated: Secondary | ICD-10-CM | POA: Diagnosis not present

## 2024-02-18 DIAGNOSIS — K219 Gastro-esophageal reflux disease without esophagitis: Secondary | ICD-10-CM | POA: Diagnosis not present

## 2024-02-18 DIAGNOSIS — E1129 Type 2 diabetes mellitus with other diabetic kidney complication: Secondary | ICD-10-CM | POA: Diagnosis not present

## 2024-02-18 DIAGNOSIS — M1A0610 Idiopathic chronic gout, right knee, without tophus (tophi): Secondary | ICD-10-CM | POA: Diagnosis not present

## 2024-02-18 DIAGNOSIS — E1122 Type 2 diabetes mellitus with diabetic chronic kidney disease: Secondary | ICD-10-CM

## 2024-02-18 DIAGNOSIS — E1140 Type 2 diabetes mellitus with diabetic neuropathy, unspecified: Secondary | ICD-10-CM | POA: Diagnosis not present

## 2024-02-18 DIAGNOSIS — E1169 Type 2 diabetes mellitus with other specified complication: Secondary | ICD-10-CM | POA: Diagnosis not present

## 2024-02-18 DIAGNOSIS — N183 Chronic kidney disease, stage 3 unspecified: Secondary | ICD-10-CM | POA: Diagnosis not present

## 2024-02-18 DIAGNOSIS — Z0000 Encounter for general adult medical examination without abnormal findings: Secondary | ICD-10-CM

## 2024-02-18 DIAGNOSIS — N400 Enlarged prostate without lower urinary tract symptoms: Secondary | ICD-10-CM

## 2024-02-18 LAB — MICROALBUMIN, URINE WAIVED
Creatinine, Urine Waived: 300 mg/dL (ref 10–300)
Microalb, Ur Waived: 150 mg/L — ABNORMAL HIGH (ref 0–19)

## 2024-02-18 LAB — BAYER DCA HB A1C WAIVED: HB A1C (BAYER DCA - WAIVED): 5.5 % (ref 4.8–5.6)

## 2024-02-18 MED ORDER — ALLOPURINOL 300 MG PO TABS: 300.0000 mg | ORAL_TABLET | Freq: Every day | ORAL | 4 refills | Status: AC

## 2024-02-18 MED ORDER — VITAMIN B-12 1000 MCG PO TABS: 1000.0000 ug | ORAL_TABLET | Freq: Every day | ORAL | 4 refills | Status: AC

## 2024-02-18 MED ORDER — ATORVASTATIN CALCIUM 40 MG PO TABS: 40.0000 mg | ORAL_TABLET | Freq: Every day | ORAL | 4 refills | Status: AC

## 2024-02-18 MED ORDER — PROPRANOLOL HCL ER 60 MG PO CP24: 60.0000 mg | ORAL_CAPSULE | Freq: Every day | ORAL | 4 refills | Status: AC

## 2024-02-18 MED ORDER — GABAPENTIN 300 MG PO CAPS: 300.0000 mg | ORAL_CAPSULE | Freq: Every day | ORAL | 3 refills | Status: AC

## 2024-02-18 MED ORDER — PANTOPRAZOLE SODIUM 40 MG PO TBEC: 40.0000 mg | DELAYED_RELEASE_TABLET | Freq: Every day | ORAL | 4 refills | Status: AC

## 2024-02-18 NOTE — Assessment & Plan Note (Signed)
 Chronic, ongoing.  Followed by hematology and recent notes reviewed.  Continue to collaborate with them.

## 2024-02-18 NOTE — Assessment & Plan Note (Signed)
 Chronic, stable with no recent flares.  Continue Allopurinol  and renal dose as needed, if eGFR consistently <60 will reduce dosing.  Labs today.

## 2024-02-18 NOTE — Assessment & Plan Note (Signed)
Chronic, ongoing with occasional low mag level.  Recheck today and adjust regimen as needed.  Risks of PPI use were discussed with patient including bone loss, C. Diff diarrhea, pneumonia, infections, CKD, electrolyte abnormalities.  Verbalizes understanding and chooses to continue the medication.

## 2024-02-18 NOTE — Assessment & Plan Note (Signed)
 Chronic, stable.  BP at goal in office today. Recommend he monitor BP at least a few mornings a week at home and document. DASH diet at home. Continue current medication regimen and adjust as needed, Propranolol  only at this time. Labs today CMP and urine ALB. Urine ALB 150 January 2026, cannot take ACE or ARB.

## 2024-02-18 NOTE — Assessment & Plan Note (Signed)
 Chronic, he continues to drink liquor nightly.  Check CMP toda. Known hepatic steatosis on imaging 11/04/23.  Recommend working towards complete cessation of alcohol  use.  Continue to collaborate with GI as needed.

## 2024-02-18 NOTE — Assessment & Plan Note (Signed)
 Chronic, ongoing.  Continue current medication regimen and adjust as needed. Lipid panel today.

## 2024-02-18 NOTE — Assessment & Plan Note (Addendum)
 Chronic, ongoing with A1c 5.5%today.  At this time continue diet focus and restart Metformin  as needed.  Urine ALB 150 January 2026, cannot take ACE or ARB. Restart Gabapentin  for neuropathy pain +  B12 and place Voltaren gel on feet at night. Recommend he check BS 3 days a week and document for provider visits.   - Foot exam up to date - Eye Exam up to date - Vaccines up to date - Statin on board.

## 2024-02-18 NOTE — Assessment & Plan Note (Addendum)
 Chronic, ongoing with A1c 5.5% today and remains off Metformin . Urine ALB 150 January 2026, cannot take ACE or ARB.  Restart Gabapentin  for neuropathy pain, start with 300 MG at bedtime and discussed not to drink alcohol  at the same time he takes medication.  Recommend he check BS 3 days a week and document for provider visits.  Could consider SGLT2 in future for kidney health. - Foot exam up to date - Eye exam up to date - Vaccines up to date - Statin on board.

## 2024-02-18 NOTE — Progress Notes (Signed)
 "  BP 131/76 (BP Location: Left Arm, Cuff Size: Normal)   Pulse 96   Temp 98.8 F (37.1 C) (Oral)   Ht 5' 9 (1.753 m)   Wt 205 lb 6.4 oz (93.2 kg)   SpO2 95%   BMI 30.33 kg/m    Subjective:    Patient ID: George Gomez, male    DOB: [birth year], 64 y.o.   MRN: [id number]  HPI: Drakkar Medeiros is a 64 y.o. male presenting on 02/18/2024 for comprehensive medical examination. Current medical complaints include: poor sleep  He currently lives with: self Interim Problems from his last visit: poor sleep  DIABETES May A1c 5.5% May.  Currently is diet controlled. Took Gabapentin , but not currently taking and would like to restart. Is also not taking B12 supplement and has noticed neuropathy has been worse. He does continue magnesium  supplement for history of low levels. Took Metformin  in the past Hypoglycemic episodes:no Polydipsia/polyuria: no Visual disturbance: no Chest pain: no Paresthesias: no Glucose Monitoring: no  Accucheck frequency: Not Checking  Fasting glucose:  Post prandial:  Evening:  Before meals: Taking Insulin ?: no  Long acting insulin :  Short acting insulin : Blood Pressure Monitoring: not checking Retinal Examination: Up to Date -- [facility] Exam: Up to Date Pneumovax: Up To Date Influenza: Up to Date Aspirin: no   HYPERTENSION / HYPERLIPIDEMIA Continues on Propranolol  + Atorvastatin . Took ACE and ARB in past, both with severe reactions per his report. Amlodipine  in past. Drinks two shots of liquor daily -- vodka and moonshine. Not interested in cutting back.  Clemens twice recently and hit right shoulder, has had some pain on occasion with this. Was using walker at the time. The pain is easing off now. He states this is related to his knee, he follows with ortho for this and plans to discuss with them starting PT. Satisfied with current treatment? yes Duration of hypertension: chronic BP monitoring frequency: sometimes BP range: <130/80 on average BP  medication side effects: no Duration of hyperlipidemia: chronic Cholesterol medication side effects: no Cholesterol supplements: none Medication compliance: good compliance Aspirin: no Recent stressors: no Recurrent headaches: no Visual changes: no Palpitations: no Dyspnea: no Chest pain: no Lower extremity edema: occasional Dizzy/lightheaded: rarely  CHRONIC KIDNEY DISEASE (CKD 3a) & GOUT Continues Allopurinol  for gout, no recent flares. Recent kidney function on labs has been stable. CKD status: stable Medications renally dose: yes Previous renal evaluation: no Pneumovax:  Up to Date Influenza Vaccine:  Up to Date  ANEMIA (Sideroblastic) Saw hematology last on 11/10/23. Continues Protonix  daily for GERD.   Anemia status: stable Etiology of anemia: B12 and iron  deficiency Duration of anemia treatment: via hematology Compliance with treatment: good compliance Iron  supplementation side effects: no Severity of anemia: moderate Fatigue: no Decreased exercise tolerance: no  Dyspnea on exertion: no Palpitations: no Bleeding: no Pica: no   DEPRESSION/INSOMNIA Has been feeling more down, but feels it is related to poor sleep.  Quit using CPAP 1-2 years ago, felt it offered no benefit and mask was always waking him up.  Has been having trouble sleeping over the past month. Neuropathy has been hurting more. Mood status: a little more down with poor sleep Satisfied with current treatment?: yes Symptom severity: mild  Duration of current treatment : chronic Psychotherapy/counseling: none Previous psychiatric medications: none he recalls Depressed mood: yes Anxious mood: no Anhedonia: no Significant weight loss or gain: no Insomnia: yes hard to fall asleep Fatigue: yes Feelings of worthlessness or guilt: no Impaired  concentration/indecisiveness: no Suicidal ideations: no Hopelessness: no Crying spells: no    02/18/2024    8:54 AM 12/16/2023   11:07 AM 11/10/2023     3:04 PM 09/15/2023   10:37 AM 08/18/2023    2:54 PM  Depression screen PHQ 2/9  Decreased Interest 2 0 0 0 1  Down, Depressed, Hopeless 2 0 0 0 0  PHQ - 2 Score 4 0 0 0 1  Altered sleeping 2    1  Tired, decreased energy 3    3  Change in appetite 0    1  Feeling bad or failure about yourself  0    0  Trouble concentrating 1    0  Moving slowly or fidgety/restless 0    0  Suicidal thoughts 0    0  PHQ-9 Score 10    6   Difficult doing work/chores Very difficult    Somewhat difficult     Data saved with a previous flowsheet row definition       02/18/2024    8:54 AM 12/16/2023   11:07 AM 08/18/2023    2:54 PM 07/29/2023    2:02 PM  GAD 7 : Generalized Anxiety Score  Nervous, Anxious, on Edge 2 0  2  1   Control/stop worrying 2 0  0  0   Worry too much - different things 0  0  0   Trouble relaxing 1  0  0   Restless 1  0  0   Easily annoyed or irritable 1  2  1    Afraid - awful might happen 0  0  0   Total GAD 7 Score 7  4 2   Anxiety Difficulty Somewhat difficult  Somewhat difficult Somewhat difficult     Data saved with a previous flowsheet row definition        06/26/2023    9:09 AM 08/18/2023    2:54 PM 09/15/2023   10:37 AM 12/16/2023   11:07 AM 02/18/2024    8:54 AM  Fall Risk  Falls in the past year? 0 0 1 0 1  Was there an injury with Fall? 0  0  1  0  0  Fall Risk Category Calculator 0 0 2 0 2  Patient at Risk for Falls Due to No Fall Risks No Fall Risks No Fall Risks No Fall Risks History of fall(s)  Fall risk Follow up Falls evaluation completed Falls evaluation completed Falls evaluation completed Falls evaluation completed Falls evaluation completed     Data saved with a previous flowsheet row definition    Functional Status Survey: Is the patient deaf or have difficulty hearing?: No Does the patient have difficulty seeing, even when wearing glasses/contacts?: No Does the patient have difficulty concentrating, remembering, or making decisions?: No Does the  patient have difficulty walking or climbing stairs?: No Does the patient have difficulty dressing or bathing?: No Does the patient have difficulty doing errands alone such as visiting a doctor's office or shopping?: No   Past Medical History:  Past Medical History:  Diagnosis Date   Acute on chronic renal failure 03/20/2016   Acute renal failure 10/12/2018   Acute renal failure (ARF) 08/28/2016   Anemia    B12 deficiency 12/11/2017   Clostridium difficile diarrhea 03/18/2016   Diabetes mellitus without complication (HCC)    Diverticulitis    Pt states diverticulitis   EtOH dependence (HCC)    Folate deficiency 07/25/2018   GI bleed 05/30/2017  Hypercholesteremia    Hypertension    Melena 10/21/2018   Weight loss 09/15/2018    Surgical History:  Past Surgical History:  Procedure Laterality Date   COLONOSCOPY WITH PROPOFOL  N/A 10/16/2018   Procedure: COLONOSCOPY WITH PROPOFOL ;  Surgeon: Unk Corinn Skiff, MD;  Location: [facility] ENDOSCOPY;  Service: Gastroenterology;  Laterality: N/A;   ENTEROSCOPY N/A 10/16/2018   Procedure: ENTEROSCOPY;  Surgeon: Unk Corinn Skiff, MD;  Location[facility] ENDOSCOPY;  Service: Gastroenterology;  Laterality: N/A;   ESOPHAGOGASTRODUODENOSCOPY N/A 07/02/2018   Procedure: ESOPHAGOGASTRODUODENOSCOPY (EGD);  Surgeon: Unk Corinn Skiff, MD;  Location: [facility] ENDOSCOPY;  Service: Gastroenterology;  Laterality: N/A;   ESOPHAGOGASTRODUODENOSCOPY (EGD) WITH PROPOFOL  N/A 06/01/2017   Procedure: ESOPHAGOGASTRODUODENOSCOPY (EGD) WITH PROPOFOL ;  Surgeon: Therisa Bi, MD;  Location: [facility] ENDOSCOPY;  Service: Gastroenterology;  Laterality: N/A;   GIVENS CAPSULE STUDY N/A 10/17/2018   Procedure: GIVENS CAPSULE STUDY;  Surgeon: Unk Corinn Skiff, MD;  Location: [facility] ENDOSCOPY;  Service: Gastroenterology;  Laterality: N/A;   GIVENS CAPSULE STUDY N/A 08/02/2019   Procedure: GIVENS CAPSULE STUDY;  Surgeon: Unk Corinn Skiff, MD;  Location: [facility] ENDOSCOPY;  Service: Gastroenterology;   Laterality: N/A;   IR BONE MARROW BIOPSY & ASPIRATION  05/18/2023   none      Medications:  Current Outpatient Medications on File Prior to Visit  Medication Sig   Cholecalciferol 50 MCG (2000 UT) CAPS Take 1 capsule by mouth daily.   colchicine  0.6 MG tablet Take 1 tablet (0.6 mg total) by mouth 2 (two) times daily.   loperamide  (IMODIUM ) 2 MG capsule Take by mouth.   meloxicam  (MOBIC ) 15 MG tablet TAKE 1 TABLET BY MOUTH DAILY, UNTIL KNEE SYMPTOMS RESOLVE   tiZANidine  (ZANAFLEX ) 2 MG tablet TAKE 1 TABLET(2 MG) BY MOUTH EVERY 8 HOURS AS NEEDED FOR MUSCLE SPASMS   Blood Glucose Monitoring Suppl (ONETOUCH VERIO) w/Device KIT Use to check blood sugar 3 times a day and document results, bring to appointments.  Goal is <130 fasting blood sugar and <180 two hours after meals. (Patient not taking: Reported on 02/18/2024)   glucose blood (ONETOUCH VERIO) test strip Use to check blood sugar 3 times a day and document results, bring to appointments.  Goal is <130 fasting blood sugar and <180 two hours after meals. (Patient not taking: Reported on 02/18/2024)   Lancets (ONETOUCH ULTRASOFT) lancets Use to check blood sugar 3 times a day and document results, bring to appointments.  Goal is <130 fasting blood sugar and <180 two hours after meals. (Patient not taking: Reported on 02/18/2024)   No current facility-administered medications on file prior to visit.    Allergies:  Allergies[1]  Social History:  Social History   Socioeconomic History   Marital status: Divorced    Spouse name: Not on file   Number of children: 0   Years of education: Not on file   Highest education level: Not on file  Occupational History   Occupation: on disability  Tobacco Use   Smoking status: Never   Smokeless tobacco: Never  Vaping Use   Vaping status: Never Used  Substance and Sexual Activity   Alcohol  use: Yes    Alcohol /week: 21.0 standard drinks of alcohol     Types: 21 Shots of liquor per week    Comment:  3 shots of liquor daily   Drug use: No   Sexual activity: Yes  Other Topics Concern   Not on file  Social History Narrative   Not on file   Social Drivers of Health  Tobacco Use: Low Risk (02/18/2024)   Patient History    Smoking Tobacco Use: Never    Smokeless Tobacco Use: Never    Passive Exposure: Not on file  Financial Resource Strain: Low Risk (05/14/2023)   Overall Financial Resource Strain (CARDIA)    Difficulty of Paying Living Expenses: Not very hard  Food Insecurity: No Food Insecurity (11/05/2023)   Epic    Worried About Programme Researcher, Broadcasting/film/video in the Last Year: Never true    Ran Out of Food in the Last Year: Never true  Transportation Needs: No Transportation Needs (11/05/2023)   Epic    Lack of Transportation (Medical): No    Lack of Transportation (Non-Medical): No  Physical Activity: Inactive (05/14/2023)   Exercise Vital Sign    Days of Exercise per Week: 0 days    Minutes of Exercise per Session: 0 min  Stress: No Stress Concern Present (05/14/2023)   [employer] of Occupational Health - Occupational Stress Questionnaire    Feeling of Stress : Not at all  Social Connections: Socially Isolated (11/05/2023)   Social Connection and Isolation Panel    Frequency of Communication with Friends and Family: More than three times a week    Frequency of Social Gatherings with Friends and Family: More than three times a week    Attends Religious Services: Never    Database Administrator or Organizations: No    Attends Banker Meetings: Never    Marital Status: Divorced  Catering Manager Violence: Not At Risk (11/05/2023)   Epic    Fear of Current or Ex-Partner: No    Emotionally Abused: No    Physically Abused: No    Sexually Abused: No  Depression (PHQ2-9): Medium Risk (02/18/2024)   Depression (PHQ2-9)    PHQ-2 Score: 10  Alcohol  Screen: High Risk (03/10/2023)   Alcohol  Screen    Last Alcohol  Screening Score (AUDIT): 19  Housing: Unknown (11/05/2023)    Epic    Unable to Pay for Housing in the Last Year: Not on file    Number of Times Moved in the Last Year: Not on file    Homeless in the Last Year: No  Utilities: Not At Risk (11/05/2023)   Epic    Threatened with loss of utilities: No  Health Literacy: Adequate Health Literacy (05/14/2023)   B1300 Health Literacy    Frequency of need for help with medical instructions: Rarely   Tobacco Use History[2] Social History   Substance and Sexual Activity  Alcohol  Use Yes   Alcohol /week: 21.0 standard drinks of alcohol    Types: 21 Shots of liquor per week   Comment: 3 shots of liquor daily    Family History:  Family History  Problem Relation Age of Onset   Rheum arthritis Mother    Diabetes Father    Heart disease Father    Cancer Father    COPD Sister    Rheum arthritis Sister    Diabetes Maternal Grandmother    Cancer Maternal Grandfather    Diabetes Paternal Grandmother    Cancer Paternal Grandfather     Past medical history, surgical history, medications, allergies, family history and social history reviewed with patient today and changes made to appropriate areas of the chart.   ROS All other ROS negative except what is listed above and in the HPI.      Objective:    BP 131/76 (BP Location: Left Arm, Cuff Size: Normal)   Pulse 96   Temp 98.8  F (37.1 C) (Oral)   Ht 5' 9 (1.753 m)   Wt 205 lb 6.4 oz (93.2 kg)   SpO2 95%   BMI 30.33 kg/m   Wt Readings from Last 3 Encounters:  02/18/24 205 lb 6.4 oz (93.2 kg)  12/16/23 210 lb (95.3 kg)  11/10/23 207 lb 8 oz (94.1 kg)    Physical Exam Vitals and nursing note reviewed.  Constitutional:      General: He is awake. He is not in acute distress.    Appearance: He is well-developed and well-groomed. He is obese. He is not ill-appearing or toxic-appearing.  HENT:     Head: Normocephalic and atraumatic.     Right Ear: Hearing, tympanic membrane, ear canal and external ear normal. No drainage.     Left Ear:  Hearing, tympanic membrane, ear canal and external ear normal. No drainage.     Nose: Nose normal.     Mouth/Throat:     Pharynx: Uvula midline.  Eyes:     General: Lids are normal.        Right eye: No discharge.        Left eye: No discharge.     Extraocular Movements: Extraocular movements intact.     Conjunctiva/sclera: Conjunctivae normal.     Pupils: Pupils are equal, round, and reactive to light.     Visual Fields: Right eye visual fields normal and left eye visual fields normal.  Neck:     Thyroid : No thyromegaly.     Vascular: No carotid bruit or JVD.     Trachea: Trachea normal.  Cardiovascular:     Rate and Rhythm: Normal rate and regular rhythm.     Heart sounds: Normal heart sounds, S1 normal and S2 normal. No murmur heard.    No gallop.  Pulmonary:     Effort: Pulmonary effort is normal. No accessory muscle usage or respiratory distress.     Breath sounds: Normal breath sounds. No decreased breath sounds, wheezing or rales.  Abdominal:     General: Bowel sounds are normal.     Palpations: Abdomen is soft. There is no hepatomegaly or splenomegaly.     Tenderness: There is no abdominal tenderness.  Musculoskeletal:        General: Normal range of motion.     Cervical back: Normal range of motion and neck supple.     Right lower leg: No edema.     Left lower leg: No edema.  Lymphadenopathy:     Head:     Right side of head: No submental, submandibular, tonsillar, preauricular or posterior auricular adenopathy.     Left side of head: No submental, submandibular, tonsillar, preauricular or posterior auricular adenopathy.     Cervical: No cervical adenopathy.  Skin:    General: Skin is warm and dry.     Capillary Refill: Capillary refill takes less than 2 seconds.     Findings: No rash.  Neurological:     Mental Status: He is alert and oriented to person, place, and time.     Gait: Gait is intact.     Deep Tendon Reflexes: Reflexes are normal and symmetric.      Reflex Scores:      Brachioradialis reflexes are 2+ on the right side and 2+ on the left side.      Patellar reflexes are 2+ on the right side and 2+ on the left side. Psychiatric:        Attention and Perception: Attention normal.  Mood and Affect: Mood normal.        Speech: Speech normal.        Behavior: Behavior normal. Behavior is cooperative.        Thought Content: Thought content normal.        Cognition and Memory: Cognition normal.    Diabetic Foot Exam - Simple   Simple Foot Form Visual Inspection See comments: Yes Sensation Testing See comments: Yes Pulse Check Posterior Tibialis and Dorsalis pulse intact bilaterally: Yes Comments Sensation right 7/10 and left 8/10. Thick toenails and dry skin both feet.        02/17/2023    9:50 AM 09/24/2021    8:49 AM  6CIT Screen  What Year? 0 points 0 points  What month? 0 points 0 points  What time? 0 points 0 points  Count back from 20 0 points 0 points  Months in reverse 4 points 0 points  Repeat phrase 0 points 2 points  Total Score 4 points 2 points   Results for orders placed or performed in visit on 02/18/24  Bayer DCA Hb A1c Waived   Collection Time: 02/18/24  8:54 AM  Result Value Ref Range   HB A1C (BAYER DCA - WAIVED) 5.5 4.8 - 5.6 %  Microalbumin, Urine Waived   Collection Time: 02/18/24  8:54 AM  Result Value Ref Range   Microalb, Ur Waived 150 (H) 0 - 19 mg/L   Creatinine, Urine Waived 300 10 - 300 mg/dL   Microalb/Creat Ratio 30-300 (H) <30 mg/g      Assessment & Plan:   Problem List Items Addressed This Visit       Cardiovascular and Mediastinum   Hypertension associated with type 2 diabetes mellitus (HCC)   Chronic, stable.  BP at goal in office today. Recommend he monitor BP at least a few mornings a week at home and document. DASH diet at home. Continue current medication regimen and adjust as needed, Propranolol  only at this time. Labs today CMP and urine ALB. Urine ALB 150 January  2026, cannot take ACE or ARB.          Relevant Medications   atorvastatin  (LIPITOR) 40 MG tablet   propranolol  ER (INDERAL  LA) 60 MG 24 hr capsule   Other Relevant Orders   Bayer DCA Hb A1c Waived (Completed)   Microalbumin, Urine Waived (Completed)   TSH     Digestive   Gastroesophageal reflux disease   Chronic, ongoing with occasional low mag level.  Recheck today and adjust regimen as needed.  Risks of PPI use were discussed with patient including bone loss, C. Diff diarrhea, pneumonia, infections, CKD, electrolyte abnormalities.  Verbalizes understanding and chooses to continue the medication.       Relevant Medications   pantoprazole  (PROTONIX ) 40 MG tablet   Other Relevant Orders   Magnesium      Endocrine   Type 2 diabetes mellitus with proteinuria (HCC) - Primary   Chronic, ongoing with A1c 5.5% today. Continue off of Metformin .  Urine ALB 150 January 2026, cannot take ACE or ARB due to severe reactions in past. Recommend he check BS 3 days a week and document for provider visits.   - Foot exam up to date - Eye Exam up to date - Statin on board - Vaccines up to date       Relevant Medications   atorvastatin  (LIPITOR) 40 MG tablet   Other Relevant Orders   Bayer DCA Hb A1c Waived (Completed)   Type  2 diabetes mellitus with diabetic neuropathy (HCC)   Chronic, ongoing with A1c 5.5%today.  At this time continue diet focus and restart Metformin  as needed.  Urine ALB 150 January 2026, cannot take ACE or ARB. Restart Gabapentin  for neuropathy pain +  B12 and place Voltaren gel on feet at night. Recommend he check BS 3 days a week and document for provider visits.   - Foot exam up to date - Eye Exam up to date - Vaccines up to date - Statin on board.      Relevant Medications   atorvastatin  (LIPITOR) 40 MG tablet   Other Relevant Orders   Bayer DCA Hb A1c Waived (Completed)   Hyperlipidemia due to type 2 diabetes mellitus (HCC)   Chronic, ongoing.  Continue current  medication regimen and adjust as needed.  Lipid panel today.        Relevant Medications   atorvastatin  (LIPITOR) 40 MG tablet   propranolol  ER (INDERAL  LA) 60 MG 24 hr capsule   Other Relevant Orders   Bayer DCA Hb A1c Waived (Completed)   Comprehensive metabolic panel with GFR   Lipid Panel w/o Chol/HDL Ratio   CKD stage 3 due to type 2 diabetes mellitus (HCC)   Chronic, ongoing with A1c 5.5% today and remains off Metformin . Urine ALB 150 January 2026, cannot take ACE or ARB.  Restart Gabapentin  for neuropathy pain, start with 300 MG at bedtime and discussed not to drink alcohol  at the same time he takes medication.  Recommend he check BS 3 days a week and document for provider visits.  Could consider SGLT2 in future for kidney health. - Foot exam up to date - Eye exam up to date - Vaccines up to date - Statin on board.      Relevant Medications   atorvastatin  (LIPITOR) 40 MG tablet   Other Relevant Orders   Bayer DCA Hb A1c Waived (Completed)   Microalbumin, Urine Waived (Completed)   CBC with Differential/Platelet   Comprehensive metabolic panel with GFR     Other   Sideroblastic anemia (HCC)   Chronic, ongoing.  Followed by hematology and recent notes reviewed.  Continue to collaborate with them.      Relevant Medications   cyanocobalamin  (VITAMIN B12) 1000 MCG tablet   Other Relevant Orders   CBC with Differential/Platelet   Chronic gout without tophus   Chronic, stable with no recent flares.  Continue Allopurinol  and renal dose as needed, if eGFR consistently <60 will reduce dosing.  Labs today.      Relevant Medications   allopurinol  (ZYLOPRIM ) 300 MG tablet   Other Relevant Orders   Uric acid   Alcohol  abuse   Chronic, he continues to drink liquor nightly.  Check CMP toda. Known hepatic steatosis on imaging 11/04/23.  Recommend working towards complete cessation of alcohol  use.  Continue to collaborate with GI as needed.      Other Visit Diagnoses        Benign prostatic hyperplasia without lower urinary tract symptoms       PSA on labs today.   Relevant Orders   PSA     Flu vaccine need       Flu vaccine today, educated patient.   Relevant Orders   Flu vaccine HIGH DOSE PF(Fluzone Trivalent) (Completed)     Encounter for annual physical exam       Annual physical today with labs and health maintenance reviewed, discussed with patient.       Discussed aspirin  prophylaxis for myocardial infarction prevention and decision was it was not indicated  LABORATORY TESTING:  Health maintenance labs ordered today as discussed above.   The natural history of prostate cancer and ongoing controversy regarding screening and potential treatment outcomes of prostate cancer has been discussed with the patient. The meaning of a false positive PSA and a false negative PSA has been discussed. He indicates understanding of the limitations of this screening test and wishes to proceed with screening PSA testing.   IMMUNIZATIONS:   - Tdap: Tetanus vaccination status reviewed: last tetanus booster within 10 years. - Influenza: Administered today - Pneumovax: Up to date - Prevnar: Up to date - Zostavax vaccine: Refused  SCREENING: - Colonoscopy: Up to date  Discussed with patient purpose of the colonoscopy is to detect colon cancer at curable precancerous or early stages   - AAA Screening: Refused  -Hearing Test: Not applicable  -Spirometry: Not applicable   PATIENT COUNSELING:    Sexuality: Discussed sexually transmitted diseases, partner selection, use of condoms, avoidance of unintended pregnancy  and contraceptive alternatives.   Advised to avoid cigarette smoking.  I discussed with the patient that most people either abstain from alcohol  or drink within safe limits (<=14/week and <=4 drinks/occasion for males, <=7/weeks and <= 3 drinks/occasion for females) and that the risk for alcohol  disorders and other health effects rises proportionally  with the number of drinks per week and how often a drinker exceeds daily limits.  Discussed cessation/primary prevention of drug use and availability of treatment for abuse.   Diet: Encouraged to adjust caloric intake to maintain  or achieve ideal body weight, to reduce intake of dietary saturated fat and total fat, to limit sodium intake by avoiding high sodium foods and not adding table salt, and to maintain adequate dietary potassium and calcium  preferably from fresh fruits, vegetables, and low-fat dairy products.    Stressed the importance of regular exercise  Injury prevention: Discussed safety belts, safety helmets, smoke detector, smoking near bedding or upholstery.   Dental health: Discussed importance of regular tooth brushing, flossing, and dental visits.   Follow up plan: NEXT PREVENTATIVE PHYSICAL DUE IN 1 YEAR. Return in about 6 weeks (around 03/31/2024) for INSOMNIA AND NEUROPATHY.      [1]  Allergies Allergen Reactions   Lisinopril  Swelling   Losartan  Swelling  [2]  Social History Tobacco Use  Smoking Status Never  Smokeless Tobacco Never   "

## 2024-02-18 NOTE — Assessment & Plan Note (Signed)
 Chronic, ongoing with A1c 5.5% today. Continue off of Metformin .  Urine ALB 150 January 2026, cannot take ACE or ARB due to severe reactions in past. Recommend he check BS 3 days a week and document for provider visits.   - Foot exam up to date - Eye Exam up to date - Statin on board - Vaccines up to date

## 2024-02-19 ENCOUNTER — Ambulatory Visit: Payer: Self-pay | Admitting: Nurse Practitioner

## 2024-02-19 DIAGNOSIS — E876 Hypokalemia: Secondary | ICD-10-CM

## 2024-02-19 LAB — COMPREHENSIVE METABOLIC PANEL WITH GFR
ALT: 15 IU/L (ref 0–44)
AST: 40 IU/L (ref 0–40)
Albumin: 4.2 g/dL (ref 3.9–4.9)
Alkaline Phosphatase: 94 IU/L (ref 47–123)
BUN/Creatinine Ratio: 12 (ref 10–24)
BUN: 10 mg/dL (ref 8–27)
Bilirubin Total: 0.6 mg/dL (ref 0.0–1.2)
CO2: 22 mmol/L (ref 20–29)
Calcium: 9.2 mg/dL (ref 8.6–10.2)
Chloride: 97 mmol/L (ref 96–106)
Creatinine, Ser: 0.85 mg/dL (ref 0.76–1.27)
Globulin, Total: 2.8 g/dL (ref 1.5–4.5)
Glucose: 119 mg/dL — ABNORMAL HIGH (ref 70–99)
Potassium: 3.3 mmol/L — ABNORMAL LOW (ref 3.5–5.2)
Sodium: 138 mmol/L (ref 134–144)
Total Protein: 7 g/dL (ref 6.0–8.5)
eGFR: 96 mL/min/1.73

## 2024-02-19 LAB — CBC WITH DIFFERENTIAL/PLATELET
Basophils Absolute: 0 x10E3/uL (ref 0.0–0.2)
Basos: 1 %
EOS (ABSOLUTE): 0.1 x10E3/uL (ref 0.0–0.4)
Eos: 1 %
Hematocrit: 32.4 % — ABNORMAL LOW (ref 37.5–51.0)
Hemoglobin: 10.8 g/dL — ABNORMAL LOW (ref 13.0–17.7)
Immature Grans (Abs): 0 x10E3/uL (ref 0.0–0.1)
Immature Granulocytes: 0 %
Lymphocytes Absolute: 0.8 x10E3/uL (ref 0.7–3.1)
Lymphs: 22 %
MCH: 31 pg (ref 26.6–33.0)
MCHC: 33.3 g/dL (ref 31.5–35.7)
MCV: 93 fL (ref 79–97)
Monocytes Absolute: 0.5 x10E3/uL (ref 0.1–0.9)
Monocytes: 13 %
Neutrophils Absolute: 2.3 x10E3/uL (ref 1.4–7.0)
Neutrophils: 62 %
Platelets: 177 x10E3/uL (ref 150–450)
RBC: 3.48 x10E6/uL — ABNORMAL LOW (ref 4.14–5.80)
RDW: 13.5 % (ref 11.6–15.4)
WBC: 3.7 x10E3/uL (ref 3.4–10.8)

## 2024-02-19 LAB — URIC ACID: Uric Acid: 7.5 mg/dL (ref 3.8–8.4)

## 2024-02-19 LAB — TSH: TSH: 3.13 u[IU]/mL (ref 0.450–4.500)

## 2024-02-19 LAB — LIPID PANEL W/O CHOL/HDL RATIO
Cholesterol, Total: 212 mg/dL — ABNORMAL HIGH (ref 100–199)
HDL: 105 mg/dL
LDL Chol Calc (NIH): 91 mg/dL (ref 0–99)
Triglycerides: 93 mg/dL (ref 0–149)
VLDL Cholesterol Cal: 16 mg/dL (ref 5–40)

## 2024-02-19 LAB — MAGNESIUM: Magnesium: 0.9 mg/dL — CL (ref 1.6–2.3)

## 2024-02-19 LAB — PSA: Prostate Specific Ag, Serum: 0.7 ng/mL (ref 0.0–4.0)

## 2024-02-19 MED ORDER — POTASSIUM CHLORIDE CRYS ER 10 MEQ PO TBCR: 10.0000 meq | EXTENDED_RELEASE_TABLET | Freq: Every day | ORAL | 0 refills | Status: AC

## 2024-02-19 MED ORDER — MAGNESIUM CHLORIDE 64 MG PO TABS: 1.0000 | ORAL_TABLET | Freq: Two times a day (BID) | ORAL | 4 refills | Status: AC

## 2024-02-23 ENCOUNTER — Telehealth: Payer: Self-pay | Admitting: Nurse Practitioner

## 2024-02-23 ENCOUNTER — Ambulatory Visit: Payer: Self-pay

## 2024-02-23 NOTE — Telephone Encounter (Signed)
 Copied from CRM #[id number]. Topic: Appointments - Scheduling Inquiry for Clinic >> Feb 23, 2024 12:14 PM Willma R wrote: Reason for CRM: Patient is calling to schedule an appointment with Dr Alvia for an injection in his knee. States they discussed at a previous appointment.  Patient can be reached at [phone]

## 2024-02-23 NOTE — Telephone Encounter (Signed)
 Pt returned call, states he picked up everything except for his B12.

## 2024-02-24 NOTE — Progress Notes (Signed)
 Needs lab visit scheduled for one week please as long as he has picked up and is taking magnesium :)

## 2024-02-24 NOTE — Progress Notes (Signed)
 Lab appt scheduled.

## 2024-03-01 ENCOUNTER — Ambulatory Visit: Admitting: Family Medicine

## 2024-03-01 ENCOUNTER — Telehealth: Payer: Self-pay

## 2024-03-01 NOTE — Telephone Encounter (Signed)
 Spoke with patient and informed him his appt for today has been rescheduled until 03/04/24 at 9:20 am. He verbalized understanding.   George Gomez

## 2024-03-03 ENCOUNTER — Other Ambulatory Visit

## 2024-03-03 DIAGNOSIS — E876 Hypokalemia: Secondary | ICD-10-CM

## 2024-03-04 ENCOUNTER — Ambulatory Visit: Admitting: Family Medicine

## 2024-03-04 ENCOUNTER — Ambulatory Visit: Payer: Self-pay | Admitting: Nurse Practitioner

## 2024-03-04 ENCOUNTER — Encounter: Payer: Self-pay | Admitting: Nurse Practitioner

## 2024-03-04 LAB — POTASSIUM: Potassium: 4.4 mmol/L (ref 3.5–5.2)

## 2024-03-04 LAB — MAGNESIUM: Magnesium: 1.5 mg/dL — ABNORMAL LOW (ref 1.6–2.3)

## 2024-03-04 NOTE — Progress Notes (Signed)
 Contacted via MyChart  Good morning Mahamud, your labs have returned and magnesium  level is improving. Please continue supplement as ordered for this. Potassium level now normal, no further supplement needed at this time to replace potassium. Any questions?

## 2024-03-07 ENCOUNTER — Ambulatory Visit: Admitting: Family Medicine

## 2024-03-17 ENCOUNTER — Ambulatory Visit

## 2024-05-02 ENCOUNTER — Ambulatory Visit: Admitting: Nurse Practitioner

## 2024-05-10 ENCOUNTER — Inpatient Hospital Stay: Admitting: Oncology

## 2024-05-10 ENCOUNTER — Inpatient Hospital Stay
